# Patient Record
Sex: Female | Born: 1945 | Race: White | Hispanic: No | Marital: Married | Smoking: Former smoker
Health system: Southern US, Community
[De-identification: ages and names within clinical notes are randomized; demographics above are authoritative.]

## PROBLEM LIST (undated history)

## (undated) DIAGNOSIS — K219 Gastro-esophageal reflux disease without esophagitis: Secondary | ICD-10-CM

## (undated) DIAGNOSIS — F32A Depression, unspecified: Secondary | ICD-10-CM

## (undated) DIAGNOSIS — I2510 Atherosclerotic heart disease of native coronary artery without angina pectoris: Secondary | ICD-10-CM

## (undated) DIAGNOSIS — M545 Low back pain, unspecified: Secondary | ICD-10-CM

## (undated) DIAGNOSIS — F329 Major depressive disorder, single episode, unspecified: Secondary | ICD-10-CM

## (undated) DIAGNOSIS — R200 Anesthesia of skin: Secondary | ICD-10-CM

## (undated) DIAGNOSIS — E785 Hyperlipidemia, unspecified: Secondary | ICD-10-CM

## (undated) DIAGNOSIS — Z5189 Encounter for other specified aftercare: Secondary | ICD-10-CM

## (undated) DIAGNOSIS — G8929 Other chronic pain: Secondary | ICD-10-CM

## (undated) DIAGNOSIS — G5603 Carpal tunnel syndrome, bilateral upper limbs: Secondary | ICD-10-CM

## (undated) DIAGNOSIS — M1990 Unspecified osteoarthritis, unspecified site: Secondary | ICD-10-CM

## (undated) DIAGNOSIS — I219 Acute myocardial infarction, unspecified: Secondary | ICD-10-CM

## (undated) HISTORY — DX: Hyperlipidemia, unspecified: E78.5

## (undated) HISTORY — DX: Encounter for other specified aftercare: Z51.89

## (undated) HISTORY — DX: Major depressive disorder, single episode, unspecified: F32.9

## (undated) HISTORY — PX: CORONARY ANGIOPLASTY WITH STENT PLACEMENT: SHX49

## (undated) HISTORY — DX: Depression, unspecified: F32.A

## (undated) HISTORY — PX: CARPAL TUNNEL RELEASE: SHX101

## (undated) HISTORY — DX: Atherosclerotic heart disease of native coronary artery without angina pectoris: I25.10

## (undated) HISTORY — PX: ANKLE SURGERY: SHX546

## (undated) HISTORY — PX: POSTERIOR FUSION LUMBAR SPINE: SUR632

---

## 1999-11-11 ENCOUNTER — Other Ambulatory Visit: Admission: RE | Admit: 1999-11-11 | Discharge: 1999-11-11 | Payer: Self-pay | Admitting: Obstetrics and Gynecology

## 2000-09-07 ENCOUNTER — Ambulatory Visit (HOSPITAL_BASED_OUTPATIENT_CLINIC_OR_DEPARTMENT_OTHER): Admission: RE | Admit: 2000-09-07 | Discharge: 2000-09-07 | Payer: Self-pay | Admitting: Orthopedic Surgery

## 2000-12-30 ENCOUNTER — Other Ambulatory Visit: Admission: RE | Admit: 2000-12-30 | Discharge: 2000-12-30 | Payer: Self-pay | Admitting: Obstetrics and Gynecology

## 2001-11-26 ENCOUNTER — Other Ambulatory Visit: Admission: RE | Admit: 2001-11-26 | Discharge: 2001-11-26 | Payer: Self-pay | Admitting: Gynecology

## 2002-03-01 ENCOUNTER — Encounter: Payer: Self-pay | Admitting: Gastroenterology

## 2002-03-09 ENCOUNTER — Ambulatory Visit (HOSPITAL_COMMUNITY): Admission: RE | Admit: 2002-03-09 | Discharge: 2002-03-09 | Payer: Self-pay | Admitting: Gastroenterology

## 2002-03-09 ENCOUNTER — Encounter: Payer: Self-pay | Admitting: Gastroenterology

## 2002-12-08 ENCOUNTER — Other Ambulatory Visit: Admission: RE | Admit: 2002-12-08 | Discharge: 2002-12-08 | Payer: Self-pay | Admitting: Gynecology

## 2004-02-22 ENCOUNTER — Other Ambulatory Visit: Admission: RE | Admit: 2004-02-22 | Discharge: 2004-02-22 | Payer: Self-pay | Admitting: Gynecology

## 2004-09-05 ENCOUNTER — Ambulatory Visit: Payer: Self-pay | Admitting: Internal Medicine

## 2004-09-17 ENCOUNTER — Ambulatory Visit: Payer: Self-pay | Admitting: Internal Medicine

## 2005-02-12 ENCOUNTER — Ambulatory Visit: Payer: Self-pay | Admitting: Internal Medicine

## 2005-03-13 ENCOUNTER — Other Ambulatory Visit: Admission: RE | Admit: 2005-03-13 | Discharge: 2005-03-13 | Payer: Self-pay | Admitting: Gynecology

## 2005-04-29 ENCOUNTER — Ambulatory Visit: Payer: Self-pay | Admitting: Internal Medicine

## 2005-05-03 ENCOUNTER — Encounter: Admission: RE | Admit: 2005-05-03 | Discharge: 2005-05-03 | Payer: Self-pay | Admitting: Internal Medicine

## 2005-05-07 ENCOUNTER — Ambulatory Visit: Payer: Self-pay | Admitting: Internal Medicine

## 2005-08-01 ENCOUNTER — Ambulatory Visit: Payer: Self-pay | Admitting: Family Medicine

## 2005-09-10 ENCOUNTER — Encounter: Admission: RE | Admit: 2005-09-10 | Discharge: 2005-09-10 | Payer: Self-pay | Admitting: Neurosurgery

## 2006-02-13 ENCOUNTER — Encounter: Admission: RE | Admit: 2006-02-13 | Discharge: 2006-02-13 | Payer: Self-pay | Admitting: Neurosurgery

## 2006-04-23 ENCOUNTER — Other Ambulatory Visit: Admission: RE | Admit: 2006-04-23 | Discharge: 2006-04-23 | Payer: Self-pay | Admitting: Gynecology

## 2006-07-18 LAB — HM MAMMOGRAPHY

## 2006-07-18 LAB — CONVERTED CEMR LAB: Pap Smear: NORMAL

## 2006-08-01 ENCOUNTER — Ambulatory Visit: Payer: Self-pay | Admitting: Internal Medicine

## 2006-08-01 ENCOUNTER — Inpatient Hospital Stay (HOSPITAL_COMMUNITY): Admission: EM | Admit: 2006-08-01 | Discharge: 2006-08-05 | Payer: Self-pay | Admitting: Emergency Medicine

## 2006-08-03 ENCOUNTER — Ambulatory Visit: Payer: Self-pay | Admitting: Internal Medicine

## 2006-08-04 ENCOUNTER — Encounter: Payer: Self-pay | Admitting: Cardiology

## 2006-08-25 ENCOUNTER — Ambulatory Visit: Payer: Self-pay | Admitting: Internal Medicine

## 2006-08-26 ENCOUNTER — Ambulatory Visit: Payer: Self-pay | Admitting: Cardiology

## 2006-09-22 ENCOUNTER — Ambulatory Visit: Payer: Self-pay | Admitting: Internal Medicine

## 2006-09-22 LAB — CONVERTED CEMR LAB
ALT: 20 units/L (ref 0–40)
AST: 26 units/L (ref 0–37)
Albumin: 3.7 g/dL (ref 3.5–5.2)
Alkaline Phosphatase: 49 units/L (ref 39–117)
BUN: 8 mg/dL (ref 6–23)
Bilirubin, Direct: 0.2 mg/dL (ref 0.0–0.3)
CO2: 26 meq/L (ref 19–32)
Calcium: 9.2 mg/dL (ref 8.4–10.5)
Chloride: 109 meq/L (ref 96–112)
Cholesterol: 149 mg/dL (ref 0–200)
Creatinine, Ser: 0.9 mg/dL (ref 0.4–1.2)
GFR calc Af Amer: 82 mL/min
GFR calc non Af Amer: 68 mL/min
Glucose, Bld: 91 mg/dL (ref 70–99)
HDL: 46.2 mg/dL (ref >39.0)
LDL Cholesterol: 69 mg/dL (ref 0–99)
Potassium: 4.4 meq/L (ref 3.5–5.1)
Sodium: 143 meq/L (ref 135–145)
Total Bilirubin: 0.7 mg/dL (ref 0.3–1.2)
Total CHOL/HDL Ratio: 3.2
Total Protein: 5.9 g/dL — ABNORMAL LOW (ref 6.0–8.3)
Triglycerides: 170 mg/dL — ABNORMAL HIGH (ref 0–149)
VLDL: 34 mg/dL (ref 0–40)

## 2006-09-29 ENCOUNTER — Ambulatory Visit: Payer: Self-pay | Admitting: Internal Medicine

## 2006-10-16 ENCOUNTER — Ambulatory Visit: Payer: Self-pay | Admitting: Internal Medicine

## 2006-11-10 ENCOUNTER — Ambulatory Visit: Payer: Self-pay | Admitting: Internal Medicine

## 2006-11-10 LAB — CONVERTED CEMR LAB
ALT: 32 units/L (ref 0–40)
AST: 29 units/L (ref 0–37)
Albumin: 4 g/dL (ref 3.5–5.2)
Alkaline Phosphatase: 68 units/L (ref 39–117)
Bilirubin, Direct: 0.1 mg/dL (ref 0.0–0.3)
Cholesterol: 181 mg/dL (ref 0–200)
Direct LDL: 92.2 mg/dL
HDL: 45.4 mg/dL (ref >39.0)
Total Bilirubin: 0.8 mg/dL (ref 0.3–1.2)
Total CHOL/HDL Ratio: 4
Total Protein: 6.4 g/dL (ref 6.0–8.3)
Triglycerides: 302 mg/dL (ref 0–149)
VLDL: 60 mg/dL — ABNORMAL HIGH (ref 0–40)

## 2006-11-20 ENCOUNTER — Ambulatory Visit: Payer: Self-pay | Admitting: Internal Medicine

## 2006-12-08 ENCOUNTER — Ambulatory Visit: Payer: Self-pay

## 2007-02-16 ENCOUNTER — Telehealth: Payer: Self-pay | Admitting: Internal Medicine

## 2007-02-17 ENCOUNTER — Ambulatory Visit: Payer: Self-pay | Admitting: Internal Medicine

## 2007-02-17 LAB — CONVERTED CEMR LAB
ALT: 22 units/L (ref 0–35)
AST: 24 units/L (ref 0–37)
Albumin: 3.9 g/dL (ref 3.5–5.2)
Alkaline Phosphatase: 63 units/L (ref 39–117)
BUN: 12 mg/dL (ref 6–23)
Bilirubin, Direct: 0.1 mg/dL (ref 0.0–0.3)
CO2: 30 meq/L (ref 19–32)
Calcium: 9.7 mg/dL (ref 8.4–10.5)
Chloride: 110 meq/L (ref 96–112)
Cholesterol: 205 mg/dL (ref 0–200)
Creatinine, Ser: 0.8 mg/dL (ref 0.4–1.2)
Direct LDL: 110.8 mg/dL
GFR calc Af Amer: 94 mL/min
GFR calc non Af Amer: 78 mL/min
Glucose, Bld: 94 mg/dL (ref 70–99)
HDL: 54 mg/dL (ref >39.0)
Potassium: 4.4 meq/L (ref 3.5–5.1)
Sodium: 144 meq/L (ref 135–145)
Total Bilirubin: 1.1 mg/dL (ref 0.3–1.2)
Total CHOL/HDL Ratio: 3.8
Total Protein: 6.8 g/dL (ref 6.0–8.3)
Triglycerides: 252 mg/dL (ref 0–149)
VLDL: 50 mg/dL — ABNORMAL HIGH (ref 0–40)

## 2007-02-26 ENCOUNTER — Ambulatory Visit: Payer: Self-pay | Admitting: Internal Medicine

## 2007-02-26 LAB — CONVERTED CEMR LAB
Basophils Absolute: 0 10*3/uL (ref 0.0–0.1)
Basophils Relative: 0.7 % (ref 0.0–1.0)
Eosinophils Absolute: 0.1 10*3/uL (ref 0.0–0.6)
Eosinophils Relative: 2.3 % (ref 0.0–5.0)
HCT: 38.9 % (ref 36.0–46.0)
Hemoglobin: 13.8 g/dL (ref 12.0–15.0)
INR: 0.7 — ABNORMAL LOW (ref 0.9–2.0)
Lymphocytes Relative: 32.9 % (ref 12.0–46.0)
MCHC: 35.5 g/dL (ref 30.0–36.0)
MCV: 95.8 fL (ref 78.0–100.0)
Monocytes Absolute: 0.9 10*3/uL — ABNORMAL HIGH (ref 0.2–0.7)
Monocytes Relative: 13.1 % — ABNORMAL HIGH (ref 3.0–11.0)
Neutro Abs: 3.4 10*3/uL (ref 1.4–7.7)
Neutrophils Relative %: 51 % (ref 43.0–77.0)
Platelets: 228 10*3/uL (ref 150–400)
Prothrombin Time: 9.9 s — ABNORMAL LOW (ref 10.0–14.0)
RBC: 4.06 M/uL (ref 3.87–5.11)
RDW: 12.6 % (ref 11.5–14.6)
WBC: 6.5 10*3/uL (ref 4.5–10.5)
aPTT: 29 s (ref 26.5–36.5)

## 2007-03-02 DIAGNOSIS — E785 Hyperlipidemia, unspecified: Secondary | ICD-10-CM | POA: Insufficient documentation

## 2007-03-02 DIAGNOSIS — I10 Essential (primary) hypertension: Secondary | ICD-10-CM

## 2007-03-02 DIAGNOSIS — F32A Depression, unspecified: Secondary | ICD-10-CM | POA: Insufficient documentation

## 2007-03-02 DIAGNOSIS — I25110 Atherosclerotic heart disease of native coronary artery with unstable angina pectoris: Secondary | ICD-10-CM

## 2007-03-02 DIAGNOSIS — F329 Major depressive disorder, single episode, unspecified: Secondary | ICD-10-CM

## 2007-06-17 ENCOUNTER — Telehealth: Payer: Self-pay | Admitting: Internal Medicine

## 2007-07-05 ENCOUNTER — Ambulatory Visit: Payer: Self-pay | Admitting: Internal Medicine

## 2007-07-05 LAB — CONVERTED CEMR LAB
ALT: 34 units/L (ref 0–35)
AST: 22 units/L (ref 0–37)
Albumin: 3.8 g/dL (ref 3.5–5.2)
Alkaline Phosphatase: 54 units/L (ref 39–117)
BUN: 14 mg/dL (ref 6–23)
Basophils Absolute: 0 10*3/uL (ref 0.0–0.1)
Basophils Relative: 0.2 % (ref 0.0–1.0)
Bilirubin Urine: NEGATIVE
Bilirubin, Direct: 0.1 mg/dL (ref 0.0–0.3)
CO2: 28 meq/L (ref 19–32)
Calcium: 9.3 mg/dL (ref 8.4–10.5)
Chloride: 106 meq/L (ref 96–112)
Cholesterol: 170 mg/dL (ref 0–200)
Creatinine, Ser: 0.8 mg/dL (ref 0.4–1.2)
Eosinophils Absolute: 0.1 10*3/uL (ref 0.0–0.6)
Eosinophils Relative: 1 % (ref 0.0–5.0)
GFR calc Af Amer: 94 mL/min
GFR calc non Af Amer: 78 mL/min
Glucose, Bld: 86 mg/dL (ref 70–99)
Glucose, Urine, Semiquant: NEGATIVE
HCT: 42.9 % (ref 36.0–46.0)
HDL: 51.4 mg/dL (ref >39.0)
Hemoglobin: 14.6 g/dL (ref 12.0–15.0)
Ketones, urine, test strip: NEGATIVE
LDL Cholesterol: 87 mg/dL (ref 0–99)
Lymphocytes Relative: 41.6 % (ref 12.0–46.0)
MCHC: 34 g/dL (ref 30.0–36.0)
MCV: 97.1 fL (ref 78.0–100.0)
Monocytes Absolute: 0.9 10*3/uL — ABNORMAL HIGH (ref 0.2–0.7)
Monocytes Relative: 12.6 % — ABNORMAL HIGH (ref 3.0–11.0)
Neutro Abs: 3 10*3/uL (ref 1.4–7.7)
Neutrophils Relative %: 44.6 % (ref 43.0–77.0)
Nitrite: NEGATIVE
Platelets: 221 10*3/uL (ref 150–400)
Potassium: 4.2 meq/L (ref 3.5–5.1)
Protein, U semiquant: NEGATIVE
RBC: 4.42 M/uL (ref 3.87–5.11)
RDW: 12.2 % (ref 11.5–14.6)
Sodium: 140 meq/L (ref 135–145)
Specific Gravity, Urine: 1.025
TSH: 2.87 microintl units/mL (ref 0.35–5.50)
Total Bilirubin: 0.7 mg/dL (ref 0.3–1.2)
Total CHOL/HDL Ratio: 3.3
Total Protein: 6.3 g/dL (ref 6.0–8.3)
Triglycerides: 160 mg/dL — ABNORMAL HIGH (ref 0–149)
Urobilinogen, UA: 0.2
VLDL: 32 mg/dL (ref 0–40)
WBC: 6.8 10*3/uL (ref 4.5–10.5)
pH: 5.5

## 2007-07-12 ENCOUNTER — Ambulatory Visit: Payer: Self-pay | Admitting: Internal Medicine

## 2007-07-12 DIAGNOSIS — F17200 Nicotine dependence, unspecified, uncomplicated: Secondary | ICD-10-CM

## 2007-08-06 ENCOUNTER — Telehealth: Payer: Self-pay | Admitting: Internal Medicine

## 2007-08-10 ENCOUNTER — Telehealth: Payer: Self-pay | Admitting: Internal Medicine

## 2007-08-18 ENCOUNTER — Telehealth: Payer: Self-pay | Admitting: Internal Medicine

## 2007-09-16 ENCOUNTER — Telehealth: Payer: Self-pay | Admitting: Internal Medicine

## 2007-09-16 ENCOUNTER — Ambulatory Visit: Payer: Self-pay | Admitting: Internal Medicine

## 2007-09-30 ENCOUNTER — Ambulatory Visit: Payer: Self-pay | Admitting: Internal Medicine

## 2007-10-20 ENCOUNTER — Ambulatory Visit: Payer: Self-pay | Admitting: Internal Medicine

## 2007-11-04 ENCOUNTER — Ambulatory Visit: Payer: Self-pay | Admitting: Family Medicine

## 2008-01-03 ENCOUNTER — Telehealth: Payer: Self-pay | Admitting: Internal Medicine

## 2008-01-04 ENCOUNTER — Ambulatory Visit: Payer: Self-pay | Admitting: Internal Medicine

## 2008-01-05 ENCOUNTER — Ambulatory Visit: Payer: Self-pay | Admitting: Cardiology

## 2008-01-11 ENCOUNTER — Ambulatory Visit: Payer: Self-pay | Admitting: Internal Medicine

## 2008-01-11 ENCOUNTER — Telehealth: Payer: Self-pay | Admitting: Internal Medicine

## 2008-01-11 ENCOUNTER — Observation Stay (HOSPITAL_COMMUNITY): Admission: EM | Admit: 2008-01-11 | Discharge: 2008-01-12 | Payer: Self-pay | Admitting: Emergency Medicine

## 2008-01-14 ENCOUNTER — Telehealth: Payer: Self-pay | Admitting: Internal Medicine

## 2008-01-21 ENCOUNTER — Encounter: Payer: Self-pay | Admitting: Internal Medicine

## 2008-01-31 ENCOUNTER — Ambulatory Visit (HOSPITAL_COMMUNITY): Admission: RE | Admit: 2008-01-31 | Discharge: 2008-01-31 | Payer: Self-pay | Admitting: Neurosurgery

## 2008-02-07 ENCOUNTER — Encounter: Payer: Self-pay | Admitting: Internal Medicine

## 2008-02-14 ENCOUNTER — Ambulatory Visit: Payer: Self-pay | Admitting: Internal Medicine

## 2008-02-14 LAB — CONVERTED CEMR LAB
ALT: 17 units/L (ref 0–35)
Alkaline Phosphatase: 74 units/L (ref 39–117)
BUN: 8 mg/dL (ref 6–23)
Bilirubin, Direct: 0.1 mg/dL (ref 0.0–0.3)
Calcium: 9.5 mg/dL (ref 8.4–10.5)
Creatinine, Ser: 0.8 mg/dL (ref 0.4–1.2)
GFR calc Af Amer: 93 mL/min
Glucose, Bld: 96 mg/dL (ref 70–99)
HDL: 43.1 mg/dL (ref >39.0)
Total Bilirubin: 1 mg/dL (ref 0.3–1.2)
Triglycerides: 250 mg/dL (ref 0–149)

## 2008-02-21 ENCOUNTER — Encounter: Payer: Self-pay | Admitting: Internal Medicine

## 2008-02-23 ENCOUNTER — Ambulatory Visit: Payer: Self-pay | Admitting: Internal Medicine

## 2008-03-02 ENCOUNTER — Ambulatory Visit: Payer: Self-pay | Admitting: Internal Medicine

## 2008-03-22 ENCOUNTER — Telehealth: Payer: Self-pay | Admitting: Internal Medicine

## 2008-04-11 ENCOUNTER — Inpatient Hospital Stay (HOSPITAL_COMMUNITY): Admission: RE | Admit: 2008-04-11 | Discharge: 2008-04-15 | Payer: Self-pay | Admitting: Neurosurgery

## 2008-05-11 ENCOUNTER — Encounter: Payer: Self-pay | Admitting: Internal Medicine

## 2008-06-19 ENCOUNTER — Ambulatory Visit: Payer: Self-pay | Admitting: Internal Medicine

## 2008-06-19 LAB — CONVERTED CEMR LAB
ALT: 15 units/L (ref 0–35)
Albumin: 3.7 g/dL (ref 3.5–5.2)
Calcium: 9.1 mg/dL (ref 8.4–10.5)
Chloride: 107 meq/L (ref 96–112)
Creatinine, Ser: 0.7 mg/dL (ref 0.4–1.2)
Direct LDL: 69.2 mg/dL
GFR calc Af Amer: 109 mL/min
Potassium: 4.1 meq/L (ref 3.5–5.1)
Total CHOL/HDL Ratio: 3.5
Total Protein: 6.4 g/dL (ref 6.0–8.3)
VLDL: 43 mg/dL — ABNORMAL HIGH (ref 0–40)

## 2008-06-26 ENCOUNTER — Ambulatory Visit: Payer: Self-pay | Admitting: Internal Medicine

## 2008-06-26 LAB — CONVERTED CEMR LAB
Bilirubin Urine: NEGATIVE
Glucose, Urine, Semiquant: NEGATIVE
Protein, U semiquant: NEGATIVE
pH: 5.5

## 2008-06-30 ENCOUNTER — Telehealth: Payer: Self-pay | Admitting: Internal Medicine

## 2008-07-06 ENCOUNTER — Telehealth: Payer: Self-pay | Admitting: Internal Medicine

## 2008-07-11 ENCOUNTER — Telehealth: Payer: Self-pay | Admitting: Internal Medicine

## 2008-07-20 ENCOUNTER — Telehealth: Payer: Self-pay | Admitting: Internal Medicine

## 2008-07-31 ENCOUNTER — Telehealth: Payer: Self-pay | Admitting: Internal Medicine

## 2008-08-03 ENCOUNTER — Encounter: Payer: Self-pay | Admitting: Internal Medicine

## 2008-09-13 ENCOUNTER — Telehealth: Payer: Self-pay | Admitting: Internal Medicine

## 2008-10-26 ENCOUNTER — Ambulatory Visit: Payer: Self-pay | Admitting: Internal Medicine

## 2008-11-06 ENCOUNTER — Telehealth: Payer: Self-pay | Admitting: Internal Medicine

## 2008-12-06 ENCOUNTER — Telehealth: Payer: Self-pay | Admitting: Internal Medicine

## 2009-01-04 ENCOUNTER — Telehealth: Payer: Self-pay | Admitting: Internal Medicine

## 2009-01-04 ENCOUNTER — Encounter: Payer: Self-pay | Admitting: Internal Medicine

## 2009-01-31 ENCOUNTER — Telehealth: Payer: Self-pay | Admitting: Internal Medicine

## 2009-03-15 ENCOUNTER — Ambulatory Visit: Payer: Self-pay | Admitting: Internal Medicine

## 2009-03-15 ENCOUNTER — Inpatient Hospital Stay (HOSPITAL_COMMUNITY): Admission: EM | Admit: 2009-03-15 | Discharge: 2009-03-17 | Payer: Self-pay | Admitting: Emergency Medicine

## 2009-03-16 ENCOUNTER — Encounter (INDEPENDENT_AMBULATORY_CARE_PROVIDER_SITE_OTHER): Payer: Self-pay | Admitting: Internal Medicine

## 2009-03-29 ENCOUNTER — Ambulatory Visit: Payer: Self-pay | Admitting: Internal Medicine

## 2009-07-30 ENCOUNTER — Ambulatory Visit: Payer: Self-pay | Admitting: Internal Medicine

## 2009-07-30 ENCOUNTER — Telehealth: Payer: Self-pay | Admitting: Internal Medicine

## 2009-07-30 DIAGNOSIS — K219 Gastro-esophageal reflux disease without esophagitis: Secondary | ICD-10-CM

## 2009-08-01 LAB — CONVERTED CEMR LAB
Albumin: 4 g/dL (ref 3.5–5.2)
BUN: 9 mg/dL (ref 6–23)
Basophils Absolute: 0 10*3/uL (ref 0.0–0.1)
Basophils Relative: 0.9 % (ref 0.0–3.0)
CO2: 29 meq/L (ref 19–32)
Calcium: 9.5 mg/dL (ref 8.4–10.5)
Cholesterol: 147 mg/dL (ref 0–200)
Creatinine, Ser: 0.9 mg/dL (ref 0.4–1.2)
Eosinophils Absolute: 0.1 10*3/uL (ref 0.0–0.7)
GFR calc non Af Amer: 67.08 mL/min (ref >60)
Glucose, Bld: 104 mg/dL — ABNORMAL HIGH (ref 70–99)
HDL: 46.8 mg/dL (ref >39.00)
LDL Cholesterol: 65 mg/dL (ref 0–99)
Lymphocytes Relative: 42.5 % (ref 12.0–46.0)
MCHC: 35 g/dL (ref 30.0–36.0)
Monocytes Relative: 10.6 % (ref 3.0–12.0)
Neutrophils Relative %: 43.2 % (ref 43.0–77.0)
RBC: 4.3 M/uL (ref 3.87–5.11)
TSH: 3.14 microintl units/mL (ref 0.35–5.50)
Total Bilirubin: 1 mg/dL (ref 0.3–1.2)
Total Protein: 7 g/dL (ref 6.0–8.3)
Triglycerides: 177 mg/dL — ABNORMAL HIGH (ref 0.0–149.0)
VLDL: 35.4 mg/dL (ref 0.0–40.0)

## 2009-12-28 ENCOUNTER — Ambulatory Visit: Payer: Self-pay | Admitting: Internal Medicine

## 2009-12-28 ENCOUNTER — Telehealth: Payer: Self-pay | Admitting: Internal Medicine

## 2009-12-31 LAB — CONVERTED CEMR LAB
ALT: 19 units/L (ref 0–35)
AST: 22 units/L (ref 0–37)
BUN: 12 mg/dL (ref 6–23)
Basophils Absolute: 0 10*3/uL (ref 0.0–0.1)
CO2: 27 meq/L (ref 19–32)
Chloride: 104 meq/L (ref 96–112)
Creatinine, Ser: 0.7 mg/dL (ref 0.4–1.2)
Eosinophils Absolute: 0.1 10*3/uL (ref 0.0–0.7)
Glucose, Bld: 96 mg/dL (ref 70–99)
Lymphs Abs: 2 10*3/uL (ref 0.7–4.0)
MCV: 93.1 fL (ref 78.0–100.0)
Monocytes Absolute: 0.6 10*3/uL (ref 0.1–1.0)
Monocytes Relative: 9.9 % (ref 3.0–12.0)
Neutro Abs: 3 10*3/uL (ref 1.4–7.7)
Potassium: 4.2 meq/L (ref 3.5–5.1)
RBC: 4.37 M/uL (ref 3.87–5.11)
Sed Rate: 16 mm/hr (ref 0–22)
Total Bilirubin: 0.8 mg/dL (ref 0.3–1.2)
Total Protein: 6.9 g/dL (ref 6.0–8.3)
WBC: 5.8 10*3/uL (ref 4.5–10.5)

## 2010-02-08 ENCOUNTER — Ambulatory Visit: Payer: Self-pay | Admitting: Internal Medicine

## 2010-04-02 ENCOUNTER — Ambulatory Visit: Payer: Self-pay | Admitting: Internal Medicine

## 2010-04-04 LAB — CONVERTED CEMR LAB
ALT: 17 units/L (ref 0–35)
Alkaline Phosphatase: 89 units/L (ref 39–117)
Bilirubin, Direct: 0.1 mg/dL (ref 0.0–0.3)
Calcium: 9.3 mg/dL (ref 8.4–10.5)
Cholesterol: 204 mg/dL — ABNORMAL HIGH (ref 0–200)
Creatinine, Ser: 0.7 mg/dL (ref 0.4–1.2)
GFR calc non Af Amer: 89.46 mL/min (ref >60)
Sodium: 140 meq/L (ref 135–145)
Total CHOL/HDL Ratio: 5
Total Protein: 6.7 g/dL (ref 6.0–8.3)
Triglycerides: 336 mg/dL — ABNORMAL HIGH (ref 0.0–149.0)

## 2010-04-05 ENCOUNTER — Encounter: Payer: Self-pay | Admitting: Gastroenterology

## 2010-06-04 ENCOUNTER — Encounter (INDEPENDENT_AMBULATORY_CARE_PROVIDER_SITE_OTHER): Payer: Self-pay | Admitting: *Deleted

## 2010-06-04 ENCOUNTER — Ambulatory Visit: Payer: Self-pay | Admitting: Gastroenterology

## 2010-06-04 DIAGNOSIS — K5900 Constipation, unspecified: Secondary | ICD-10-CM | POA: Insufficient documentation

## 2010-06-04 DIAGNOSIS — R1310 Dysphagia, unspecified: Secondary | ICD-10-CM

## 2010-06-05 ENCOUNTER — Ambulatory Visit: Payer: Self-pay | Admitting: Internal Medicine

## 2010-06-05 DIAGNOSIS — D485 Neoplasm of uncertain behavior of skin: Secondary | ICD-10-CM

## 2010-06-17 ENCOUNTER — Ambulatory Visit: Payer: Self-pay | Admitting: Gastroenterology

## 2010-06-17 LAB — HM COLONOSCOPY

## 2010-09-19 NOTE — Assessment & Plan Note (Signed)
Summary: fup throat issues//ccm   Vital Signs:  Patient profile:   65 year old female Weight:      164 pounds Temp:     98.4 degrees F oral Pulse rate:   62 / minute Pulse rhythm:   regular Resp:     12 per minute BP sitting:   112 / 78  (right arm) Cuff size:   regular  Vitals Entered By: Duard Brady LPN (April 02, 2010 8:31 AM) CC: f/u on throat - worse Is Patient Diabetic? No   CC:  f/u on throat - worse.  History of Present Illness:  Follow-Up Visit      This is a 65 year old woman who presents for Follow-up visit.  The patient denies chest pain and palpitations.  Since the last visit the patient notes no new problems or concerns except has recurrent ST, cough , gerd sxs. "sometimes it feels like food lays in the back of my throat).  The patient reports taking meds as prescribed.  When questioned about possible medication side effects, the patient notes none.    Preventive Screening-Counseling & Management  Alcohol-Tobacco     Smoking Status: current  Current Problems (verified): 1)  Gerd  (ICD-530.81) 2)  Tobacco Use  (ICD-305.1) 3)  Physical Examination  (ICD-V70.0) 4)  Myocardial Infarction, Hx of  (ICD-412) 5)  Hypertension  (ICD-401.9) 6)  Hyperlipidemia  (ICD-272.4) 7)  Depression  (ICD-311) 8)  Coronary Artery Disease  (ICD-414.00)  Current Medications (verified): 1)  Aspir-81 81 Mg Tbec (Aspirin) .... Take 1 Tablet By Mouth Once A Day 2)  Clobetasol Propionate 0.05 % Soln (Clobetasol Propionate) .... Apply As Directed As Needed 3)  Metoprolol Tartrate 50 Mg Tabs (Metoprolol Tartrate) .... Take 1/2 Tablet By Mouth Twice A Day 4)  Nitroglycerin 0.4 Mg Subl (Nitroglycerin) .... Place 1 Tablet Under Tongue As Directed As Needed 5)  Plavix 75 Mg Tabs (Clopidogrel Bisulfate) .... Take 1 Tablet By Mouth Once A Day 6)  Vytorin 10-80 Mg  Tabs (Ezetimibe-Simvastatin) .... Take 1 Tablet By Mouth At Bedtime 7)  Fish Oil 500 Mg  Caps (Omega-3 Fatty Acids) ....  Once Daily 8)  Tramadol Hcl 50 Mg  Tabs (Tramadol Hcl) .... One By Mouth Bid 9)  Zolpidem Tartrate 10 Mg Tabs (Zolpidem Tartrate) .... Take 1 Tablet By Mouth At Bedtime --Rx Must Last 30 Days 10)  Fluoxetine Hcl 40 Mg Caps (Fluoxetine Hcl) .Marland Kitchen.. 1 By Mouth Daily 11)  Dexilant 60 Mg Cpdr (Dexlansoprazole) .... Take 1 Tablet By Mouth Once A Day  Allergies: 1)  ! Oxycontin (Oxycodone Hcl) 2)  Penicillin G Potassium (Penicillin G Potassium)  Physical Exam  General:  alert and well-developed.   Head:  normocephalic and atraumatic.   Eyes:  pupils equal and pupils round.   Ears:  R ear normal and L ear normal.   Neck:  No deformities, masses, or tenderness noted. Chest Wall:  No deformities, masses, Lungs:  normal respiratory effort and no intercostal retractions.   Heart:  normal rate and regular rhythm.   Abdomen:  soft and non-tender.  overweight Msk:  No deformity or scoliosis noted of thoracic or lumbar spine.   Pulses:  R radial normal and L radial normal.   Neurologic:  cranial nerves II-XII intact and gait normal.     Impression & Recommendations:  Problem # 1:  GERD (ICD-530.81) persistent gerd and intermittent dysphagia in smoker needs GI evaluation/EGD  Her updated medication list for this problem includes:  Dexilant 60 Mg Cpdr (Dexlansoprazole) .Marland Kitchen... Take 1 tablet by mouth once a day  Problem # 2:  PHYSICAL EXAMINATION (ICD-V70.0) may be due for colonoscopy  Problem # 3:  R/O MELANOMA OF SKIN, SITE UNSPECIFIED (ICD-172.9) hyperpigmented mole on back---left scapular area  Problem # 4:  HYPERTENSION (ICD-401.9)  controlled Her updated medication list for this problem includes:    Metoprolol Tartrate 50 Mg Tabs (Metoprolol tartrate) .Marland Kitchen... Take 1/2 tablet by mouth twice a day  BP today: 112/78 Prior BP: 114/60 (02/08/2010)  Labs Reviewed: K+: 4.2 (12/28/2009) Creat: : 0.7 (12/28/2009)   Chol: 147 (07/30/2009)   HDL: 46.80 (07/30/2009)   LDL: 65 (07/30/2009)    TG: 177.0 (07/30/2009)  Orders: Venipuncture (91478) TLB-BMP (Basic Metabolic Panel-BMET) (80048-METABOL)  Problem # 5:  TOBACCO USE (ICD-305.1) she is tapering off cigarrettes.  Encouraged smoking cessation and discussed different methods for smoking cessation.   Complete Medication List: 1)  Aspir-81 81 Mg Tbec (Aspirin) .... Take 1 tablet by mouth once a day 2)  Clobetasol Propionate 0.05 % Soln (Clobetasol propionate) .... Apply as directed as needed 3)  Metoprolol Tartrate 50 Mg Tabs (Metoprolol tartrate) .... Take 1/2 tablet by mouth twice a day 4)  Nitroglycerin 0.4 Mg Subl (Nitroglycerin) .... Place 1 tablet under tongue as directed as needed 5)  Plavix 75 Mg Tabs (Clopidogrel bisulfate) .... Take 1 tablet by mouth once a day 6)  Vytorin 10-80 Mg Tabs (Ezetimibe-simvastatin) .... Take 1 tablet by mouth at bedtime 7)  Fish Oil 500 Mg Caps (Omega-3 fatty acids) .... Once daily 8)  Tramadol Hcl 50 Mg Tabs (Tramadol hcl) .... One by mouth bid 9)  Zolpidem Tartrate 10 Mg Tabs (Zolpidem tartrate) .... Take 1 tablet by mouth at bedtime --rx must last 30 days 10)  Fluoxetine Hcl 40 Mg Caps (Fluoxetine hcl) .Marland Kitchen.. 1 by mouth daily 11)  Dexilant 60 Mg Cpdr (Dexlansoprazole) .... Take 1 tablet by mouth once a day  Other Orders: TLB-Hepatic/Liver Function Pnl (80076-HEPATIC) TLB-Lipid Panel (80061-LIPID) TLB-TSH (Thyroid Stimulating Hormone) (84443-TSH)  Patient Instructions: 1)  schedule mole removal 2)  We will call you about GI appointment---endoscopy and colonoscopy are possibilities Prescriptions: ZOLPIDEM TARTRATE 10 MG TABS (ZOLPIDEM TARTRATE) Take 1 tablet by mouth at bedtime --Rx must last 30 days  #30 x 1   Entered and Authorized by:   Birdie Sons MD   Signed by:   Birdie Sons MD on 04/02/2010   Method used:   Print then Give to Patient   RxID:   [id number]   Appended Document: Orders Update     Clinical Lists Changes  Orders: Added new Service order of  Specimen Handling ([id number]) - Signed

## 2010-09-19 NOTE — Procedures (Signed)
Summary: Colonoscopy   Colonoscopy  Procedure date:  03/09/2002  Findings:      Location:  [facility].   Patient Name: Bianca [name], Bianca [name] MRN: [id number] Procedure Procedures: Colonoscopy CPT: 54098.  Personnel: Endoscopist: Vania Rea. Jarold Motto, MD.  Referred By: Valetta Mole Swords, MD.  Exam Location: Exam performed in Endoscopy Suite.  Patient Consent: Procedure, Alternatives, Risks and Benefits discussed, consent obtained,  Indications  Average Risk Screening Routine.  History  Pre-Exam Physical: Performed Mar 09, 2002. Cardio-pulmonary exam, Rectal exam, Abdominal exam, Extremity exam, Mental status exam WNL.  Exam Exam: Extent of exam reached: Cecum, extent intended: Cecum.  The cecum was identified by appendiceal orifice and IC valve. Patient position: on left side. Duration of exam: 15 minutes. Colon retroflexion performed. Images taken. ASA Classification: II. Tolerance: excellent.  Monitoring: Pulse and BP monitoring, Oximetry used. Supplemental O2 given. at 2 Liters.  Colon Prep Used Golytely for colon prep. Prep results: excellent.  Fluoroscopy: Fluoroscopy was not used.  Sedation Meds: Fentanyl 50 mcg. given IV. Versed 4 mg. given IV.  Instrument(s): Adjustable colonoscope.  Findings - NOT SEEN ON EXAM: Cecum to Rectum. Polyps, AVM's, Tumors, Crohn's, Diverticulosis,   Assessment Normal examination.  Events  Unplanned Interventions: No intervention was required.  Plans Medication Plan: Continue current medications.  Patient Education: Patient given standard instructions for: Patient instructed to get routine colonoscopy every 5 years.  Disposition: After procedure patient sent to recovery.  Scheduling/Referral: Follow-Up prn.    CC: Bruce H. Swords, MD  This report was created from the original endoscopy report, which was reviewed and signed by the above listed endoscopist.

## 2010-09-19 NOTE — Procedures (Signed)
Summary: Colonoscopy  Patient: [name] Note: All result statuses are Final unless otherwise noted.  Tests: (1) Colonoscopy (COL)   COL Colonoscopy           DONE (C)     [facility]     [address]     [place], [place]  [address]           COLONOSCOPY PROCEDURE REPORT           PATIENT:  Bianca [name], Bianca [name]  MR#:  [id number]     BIRTHDATE:  1946-04-26, 64 yrs. old  GENDER:  female     ENDOSCOPIST:  Vania Rea. Jarold Motto, MD, [facility]     REF. BY:  Birdie Sons, M.D.     PROCEDURE DATE:  06/17/2010     PROCEDURE:  Average-risk screening colonoscopy     G0121     ASA CLASS:  Class II     INDICATIONS:  Routine Risk Screening     MEDICATIONS:   Fentanyl 62.5 mcg IV, Versed 8 mg IV           DESCRIPTION OF PROCEDURE:   After the risks benefits and     alternatives of the procedure were thoroughly explained, informed     consent was obtained.  Digital rectal exam was performed and     revealed no abnormalities.   The LB CF-H180AL [id number] endoscope     was introduced through the anus and advanced to the cecum, which     was identified by both the appendix and ileocecal valve, limited     by a redundant colon, extreme patient discomfort.    The quality     of the prep was adequate, using MoviPrep.  The instrument was then     slowly withdrawn as the colon was fully examined.     <<PROCEDUREIMAGES>>           FINDINGS:  No polyps or cancers were seen.  This was otherwise a     normal examination of the colon.   Retroflexed views in the rectum     revealed no abnormalities.    The scope was then withdrawn from     the patient and the procedure completed.           COMPLICATIONS:  None     ENDOSCOPIC IMPRESSION:     1) No polyps or cancers     2) Otherwise normal examination     RECOMMENDATIONS:     1) Continue current colorectal screening recommendations for     "routine risk" patients with a repeat colonoscopy in 10 years.     REPEAT EXAM:  No        ______________________________     Vania Rea. Jarold Motto, MD, [facility]           CC:           n.     REVISED:  06/20/2010 03:08 PM     eSIGNED:   Vania Rea. Patterson at 06/20/2010 03:08 PM           Leslie Andrea, [id number]  Note: An exclamation mark (!) indicates a result that was not dispersed into the flowsheet. Document Creation Date: 06/20/2010 3:08 PM _______________________________________________________________________  (1) Order result status: Final Collection or observation date-time: 06/17/2010 15:52 Requested date-time:  Receipt date-time:  Reported date-time:  Referring Physician:   Ordering Physician: Sheryn Bison [phone]) Specimen Source:  Source: Launa Grill Order Number: [id number] Lab site:

## 2010-09-19 NOTE — Procedures (Signed)
Summary: Colonoscopy  Patient: [name] Note: All result statuses are Final unless otherwise noted.  Tests: (1) Colonoscopy (COL)   COL Colonoscopy           DONE     [facility]     [address]     [place], [place]  [address]           COLONOSCOPY PROCEDURE REPORT           PATIENT:  Bianca, [name]  MR#:  [id number]     BIRTHDATE:  1946-06-08, 64 yrs. old  GENDER:  female     ENDOSCOPIST:  Vania Rea. Jarold Motto, MD, [facility]     REF. BY:  Birdie Sons, M.D.     PROCEDURE DATE:  06/17/2010     PROCEDURE:  Average-risk screening colonoscopy     G0121     ASA CLASS:  Class II     INDICATIONS:  Routine Risk Screening     MEDICATIONS:   Fentanyl 75 mcg IV, Versed 8 mg IV           DESCRIPTION OF PROCEDURE:   After the risks benefits and     alternatives of the procedure were thoroughly explained, informed     consent was obtained.  Digital rectal exam was performed and     revealed no abnormalities.   The LB CF-H180AL [id number] endoscope     was introduced through the anus and advanced to the cecum, which     was identified by both the appendix and ileocecal valve, limited     by a redundant colon, extreme patient discomfort.    The quality     of the prep was adequate, using MoviPrep.  The instrument was then     slowly withdrawn as the colon was fully examined.     <<PROCEDUREIMAGES>>           FINDINGS:  No polyps or cancers were seen.  This was otherwise a     normal examination of the colon.   Retroflexed views in the rectum     revealed no abnormalities.    The scope was then withdrawn from     the patient and the procedure completed.           COMPLICATIONS:  None     ENDOSCOPIC IMPRESSION:     1) No polyps or cancers     2) Otherwise normal examination     RECOMMENDATIONS:     1) Continue current colorectal screening recommendations for     "routine risk" patients with a repeat colonoscopy in 10 years.     REPEAT EXAM:  No        ______________________________     Vania Rea. Jarold Motto, MD, [facility]           CC:           n.     eSIGNED:   Vania Rea. Patterson at 06/17/2010 03:57 PM           Leslie Andrea, [id number]  Note: An exclamation mark (!) indicates a result that was not dispersed into the flowsheet. Document Creation Date: 06/17/2010 3:58 PM _______________________________________________________________________  (1) Order result status: Final Collection or observation date-time: 06/17/2010 15:52 Requested date-time:  Receipt date-time:  Reported date-time:  Referring Physician:   Ordering Physician: Sheryn Bison [phone]) Specimen Source:  Source: Launa Grill Order Number: [id number] Lab site:   Appended Document: Colonoscopy    Clinical Lists Changes  Observations: Added  new observation of COLONNXTDUE: 05/2020 (06/17/2010 16:05)

## 2010-09-19 NOTE — Assessment & Plan Note (Signed)
Summary: 1 MONTH FOLLOW UP/CJR   Vital Signs:  Patient profile:   65 year old female Weight:      165 pounds BMI:     29.33 Temp:     98.4 degrees F oral Pulse rate:   58 / minute Pulse rhythm:   regular Resp:     12 per minute BP sitting:   114 / 60  (left arm) Cuff size:   regular  Vitals Entered By: Gladis Riffle, RN (February 08, 2010 11:29 AM)  Nutrition Counseling: Patient's BMI is greater than 25 and therefore counseled on weight management options. CC: 1 month rov--discuss dexilant Is Patient Diabetic? No   CC:  1 month rov--discuss dexilant.  History of Present Illness:  Follow-Up Visit      This is a 65 year old woman who presents for Follow-up visit.  The patient denies chest pain and palpitations.  Since the last visit the patient notes no new problems or concerns.  The patient reports taking meds as prescribed.  When questioned about possible medication side effects, the patient notes none.   depression much better GERD---resolved  All other systems reviewed and were negative   Preventive Screening-Counseling & Management  Alcohol-Tobacco     Smoking Status: current     Smoking Cessation Counseling: yes     Packs/Day: 0.25     Year Quit: 1997  Current Problems (verified): 1)  Fatigue  (ICD-780.79) 2)  Gerd  (ICD-530.81) 3)  Tobacco Use  (ICD-305.1) 4)  Physical Examination  (ICD-V70.0) 5)  Myocardial Infarction, Hx of  (ICD-412) 6)  Hypertension  (ICD-401.9) 7)  Hyperlipidemia  (ICD-272.4) 8)  Depression  (ICD-311) 9)  Coronary Artery Disease  (ICD-414.00)  Current Medications (verified): 1)  Aspir-81 81 Mg Tbec (Aspirin) .... Take 1 Tablet By Mouth Once A Day 2)  Clobetasol Propionate 0.05 % Soln (Clobetasol Propionate) .... Apply As Directed As Needed 3)  Metoprolol Tartrate 50 Mg Tabs (Metoprolol Tartrate) .... Take 1/2 Tablet By Mouth Twice A Day 4)  Nitroglycerin 0.4 Mg Subl (Nitroglycerin) .... Place 1 Tablet Under Tongue As Directed As Needed 5)   Plavix 75 Mg Tabs (Clopidogrel Bisulfate) .... Take 1 Tablet By Mouth Once A Day 6)  Vytorin 10-80 Mg  Tabs (Ezetimibe-Simvastatin) .... Take 1 Tablet By Mouth At Bedtime 7)  Fish Oil 500 Mg  Caps (Omega-3 Fatty Acids) .... Once Daily 8)  Tramadol Hcl 50 Mg  Tabs (Tramadol Hcl) .... One By Mouth Bid 9)  Zolpidem Tartrate 10 Mg Tabs (Zolpidem Tartrate) .... Take 1 Tablet By Mouth At Bedtime --Rx Must Last 30 Days 10)  Fluoxetine Hcl 40 Mg Caps (Fluoxetine Hcl) .Marland Kitchen.. 1 By Mouth Daily 11)  Dexilant 60 Mg Cpdr (Dexlansoprazole) .... Take 1 Tablet By Mouth Once A Day  Allergies: 1)  ! Oxycontin (Oxycodone Hcl) 2)  Penicillin G Potassium (Penicillin G Potassium)  Past History:  Past Medical History: Last updated: 07/12/2007 Coronary artery disease  NST MI  2007 Depression Hyperlipidemia Hypertension Myocardial infarction, hx of  1997 Chronic Back Pain---Boterro  Past Surgical History: Last updated: 06/26/2008 angioplasty low back surgery  age 6 Caesarean section ankle surgery  02/03 stent X3 2007 spianl fusion 04/11/08  Family History: Last updated: 07/12/2007 father-dementia 24 yo Mother - DM, htn, CAD (doing airly well at 38) Brother suicide Brother AIDS  Social History: Last updated: 06/26/2008 Married Alcohol use-no Regular exercise-no Current Smoker 1 cigarrette daily  Risk Factors: Exercise: no (07/12/2007)  Risk Factors: Smoking Status:  current (02/08/2010) Packs/Day: 0.25 (02/08/2010)  Social History: Packs/Day:  0.25  Physical Exam  General:  alert and well-developed.   Head:  normocephalic and atraumatic.   Eyes:  pupils equal and pupils round.   Ears:  R ear normal and L ear normal.   Neck:  No deformities, masses, or tenderness noted. Chest Wall:  No deformities, masses, chest wall tenderness to palpation of sternum Lungs:  normal respiratory effort and no intercostal retractions.   Heart:  normal rate and regular rhythm.   Abdomen:  soft and  non-tender.   Msk:  No deformity or scoliosis noted of thoracic or lumbar spine.   Neurologic:  cranial nerves II-XII intact and gait normal.     Impression & Recommendations:  Problem # 1:  FATIGUE (ICD-780.79) resolved feeling much better  Problem # 2:  TOBACCO USE (ICD-305.1)  Encouraged smoking cessation and discussed different methods for smoking cessation.   Problem # 3:  HYPERTENSION (ICD-401.9) controlled continue current medications  Her updated medication list for this problem includes:    Metoprolol Tartrate 50 Mg Tabs (Metoprolol tartrate) .Marland Kitchen... Take 1/2 tablet by mouth twice a day  BP today: 114/60 Prior BP: 128/72 (12/28/2009)  Labs Reviewed: K+: 4.2 (12/28/2009) Creat: : 0.7 (12/28/2009)   Chol: 147 (07/30/2009)   HDL: 46.80 (07/30/2009)   LDL: 65 (07/30/2009)   TG: 177.0 (07/30/2009)  Problem # 4:  CORONARY ARTERY DISEASE (ICD-414.00) no sxs continue current medications  Her updated medication list for this problem includes:    Aspir-81 81 Mg Tbec (Aspirin) .Marland Kitchen... Take 1 tablet by mouth once a day    Metoprolol Tartrate 50 Mg Tabs (Metoprolol tartrate) .Marland Kitchen... Take 1/2 tablet by mouth twice a day    Nitroglycerin 0.4 Mg Subl (Nitroglycerin) .Marland Kitchen... Place 1 tablet under tongue as directed as needed    Plavix 75 Mg Tabs (Clopidogrel bisulfate) .Marland Kitchen... Take 1 tablet by mouth once a day  Labs Reviewed: Chol: 147 (07/30/2009)   HDL: 46.80 (07/30/2009)   LDL: 65 (07/30/2009)   TG: 177.0 (07/30/2009)  Problem # 5:  GERD (ICD-530.81) comletely resolved continue PPI Her updated medication list for this problem includes:    Dexilant 60 Mg Cpdr (Dexlansoprazole) .Marland Kitchen... Take 1 tablet by mouth once a day  Complete Medication List: 1)  Aspir-81 81 Mg Tbec (Aspirin) .... Take 1 tablet by mouth once a day 2)  Clobetasol Propionate 0.05 % Soln (Clobetasol propionate) .... Apply as directed as needed 3)  Metoprolol Tartrate 50 Mg Tabs (Metoprolol tartrate) .... Take 1/2 tablet  by mouth twice a day 4)  Nitroglycerin 0.4 Mg Subl (Nitroglycerin) .... Place 1 tablet under tongue as directed as needed 5)  Plavix 75 Mg Tabs (Clopidogrel bisulfate) .... Take 1 tablet by mouth once a day 6)  Vytorin 10-80 Mg Tabs (Ezetimibe-simvastatin) .... Take 1 tablet by mouth at bedtime 7)  Fish Oil 500 Mg Caps (Omega-3 fatty acids) .... Once daily 8)  Tramadol Hcl 50 Mg Tabs (Tramadol hcl) .... One by mouth bid 9)  Zolpidem Tartrate 10 Mg Tabs (Zolpidem tartrate) .... Take 1 tablet by mouth at bedtime --rx must last 30 days 10)  Fluoxetine Hcl 40 Mg Caps (Fluoxetine hcl) .Marland Kitchen.. 1 by mouth daily 11)  Dexilant 60 Mg Cpdr (Dexlansoprazole) .... Take 1 tablet by mouth once a day  Patient Instructions: 1)  schedule mole removal---ONLY @ pt's convenience Prescriptions: DEXILANT 60 MG CPDR (DEXLANSOPRAZOLE) Take 1 tablet by mouth once a day  #30 x 0  Entered and Authorized by:   Birdie Sons MD   Signed by:   Birdie Sons MD on 02/08/2010   Method used:   Print then Give to Patient   RxID:   [id number] ZOLPIDEM TARTRATE 10 MG TABS (ZOLPIDEM TARTRATE) Take 1 tablet by mouth at bedtime --Rx must last 30 days  #30 x 1   Entered by:   Gladis Riffle, RN   Authorized by:   Birdie Sons MD   Signed by:   Gladis Riffle, RN on 02/08/2010   Method used:   Print then Give to Patient   RxID:   [id number] VYTORIN 10-80 MG  TABS (EZETIMIBE-SIMVASTATIN) Take 1 tablet by mouth at bedtime  #30 x 1   Entered by:   Gladis Riffle, RN   Authorized by:   Birdie Sons MD   Signed by:   Gladis Riffle, RN on 02/08/2010   Method used:   Print then Give to Patient   RxID:   [id number] PLAVIX 75 MG TABS (CLOPIDOGREL BISULFATE) Take 1 tablet by mouth once a day  #30 x 1   Entered by:   Gladis Riffle, RN   Authorized by:   Birdie Sons MD   Signed by:   Gladis Riffle, RN on 02/08/2010   Method used:   Print then Give to Patient   RxID:   [id number]

## 2010-09-19 NOTE — Assessment & Plan Note (Signed)
Summary: GERD/YF    History of Present Illness Visit Type: Initial Consult Primary GI MD: Sheryn Bison MD FACP FAGA Primary Netasha Wehrli: Birdie Sons, MD Requesting Tramond Slinker: Birdie Sons, MD Chief Complaint: GERD, changes in voice, patient feelas if there is something in her throat; hemorrhoidal pressure History of Present Illness:   65 year old Caucasian female with chronic GERD manifested over the last year by worsening hoarseness, coughing, throat clearing, and a globus sensation. She has been on Dexilant continually for the last year with good improvement, and also uses p.r.n. Pepcid AC. She has chronic constipation and bowel irregularity related to spinal surgery with apparently some sacral nerve damage. She denies rectal bleeding, melena, systemic complaints, anorexia or weight loss. Her last colonoscopy was approximately 10 years ago she has not had previous endoscopy or barium studies.  Past medical history is remarkable for coronary artery disease with cardiac stenting and chronic Plavix use. She also has a history of multiple drug allergies. She is followed closely by Dr. Cato Mulligan in primary care.   GI Review of Systems    Reports acid reflux, bloating, dysphagia with solids, nausea, and  vomiting.      Denies abdominal pain, belching, chest pain, dysphagia with liquids, heartburn, loss of appetite, vomiting blood, weight loss, and  weight gain.      Reports black tarry stools, change in bowel habits, constipation, diarrhea, hemorrhoids, and  rectal bleeding.     Denies anal fissure, diverticulosis, fecal incontinence, heme positive stool, irritable bowel syndrome, jaundice, light color stool, liver problems, and  rectal pain. Preventive Screening-Counseling & Management      Drug Use:  no.      Current Medications (verified): 1)  Clobetasol Propionate 0.05 % Soln (Clobetasol Propionate) .... Apply As Directed As Needed 2)  Metoprolol Tartrate 50 Mg Tabs (Metoprolol Tartrate)  .... Take 1/2 Tablet By Mouth Twice A Day 3)  Nitroglycerin 0.4 Mg Subl (Nitroglycerin) .... Place 1 Tablet Under Tongue As Directed As Needed 4)  Plavix 75 Mg Tabs (Clopidogrel Bisulfate) .... Take 1 Tablet By Mouth Once A Day 5)  Vytorin 10-80 Mg  Tabs (Ezetimibe-Simvastatin) .... Take 1 Tablet By Mouth At Bedtime 6)  Tramadol Hcl 50 Mg  Tabs (Tramadol Hcl) .... One By Mouth Bid 7)  Zolpidem Tartrate 10 Mg Tabs (Zolpidem Tartrate) .... Take 1 Tablet By Mouth At Bedtime --Rx Must Last 30 Days 8)  Fluoxetine Hcl 40 Mg Caps (Fluoxetine Hcl) .Marland Kitchen.. 1 By Mouth Daily 9)  Pepcid Ac 10 Mg Tabs (Famotidine) .... Take 1 Tablet By Mouth Daily  Allergies (verified): 1)  ! Oxycontin (Oxycodone Hcl) 2)  Penicillin G Potassium (Penicillin G Potassium)  Past History:  Past medical, surgical, family and social histories (including risk factors) reviewed for relevance to current acute and chronic problems.  Past Medical History: Reviewed history from 07/12/2007 and no changes required. Coronary artery disease  NST MI  2007 Depression Hyperlipidemia Hypertension Myocardial infarction, hx of  1997 Chronic Back Pain---Boterro  Past Surgical History: angioplastyx2 low back surgery  age 69 Caesarean section ankle surgery  02/03 stent X4 2007 spianl fusion 04/11/08  Family History: Reviewed history from 07/12/2007 and no changes required. father-dementia 31 yo Mother - DM, htn, CAD (doing airly well at 8) Brother suicide Brother AIDS Family History of Esophageal Cancer:Paternal GF  Social History: Reviewed history from 06/26/2008 and no changes required. Married Alcohol use-no Regular exercise-no Current Smoker 7 cigarrette daily Daily Caffeine Use 3 Illicit Drug Use - no Drug Use:  no  Review of Systems       The patient complains of back pain, urination changes/pain, urine leakage, and voice change.  The patient denies allergy/sinus, anemia, anxiety-new, arthritis/joint pain, blood in  urine, breast changes/lumps, change in vision, confusion, cough, coughing up blood, depression-new, fainting, fatigue, fever, headaches-new, hearing problems, heart murmur, heart rhythm changes, itching, menstrual pain, muscle pains/cramps, night sweats, nosebleeds, pregnancy symptoms, shortness of breath, skin rash, sleeping problems, sore throat, swelling of feet/legs, swollen lymph glands, thirst - excessive , urination - excessive , and vision changes.   General:  Complains of sleep disorder; denies fever, chills, sweats, anorexia, fatigue, weakness, malaise, and weight loss; she chronically takes tramadol and Ambien.. ENT:  Complains of sore throat, hoarseness, and difficulty swallowing; denies earache, ear discharge, tinnitus, decreased hearing, nasal congestion, loss of smell, and nosebleeds. CV:  Complains of dyspnea on exertion; denies chest pains, angina, palpitations, syncope, orthopnea, PND, peripheral edema, and claudication. GI:  Complains of difficulty swallowing, constipation, and change in bowel habits; denies pain on swallowing, nausea, indigestion/heartburn, vomiting, vomiting blood, abdominal pain, jaundice, gas/bloating, diarrhea, bloody BM's, black BMs, and fecal incontinence; her paternal grandfather apparently had esophageal cancer.. MS:  Complains of low back pain; denies joint pain / LOM, joint swelling, joint stiffness, joint deformity, muscle weakness, muscle cramps, muscle atrophy, leg pain at night, leg pain with exertion, and shoulder pain / LOM hand / wrist pain (CTS). Derm:  Denies rash, itching, dry skin, hives, moles, warts, and unhealing ulcers. Neuro:  Complains of weakness and abnormal sensation; denies paralysis, seizures, syncope, tremors, vertigo, transient blindness, frequent falls, frequent headaches, difficulty walking, headache, sciatica, radiculopathy other:, restless legs, memory loss, and confusion; numbness and weakness related to her spinal surgery apparently  in her left leg.Marland Kitchen Psych:  Denies depression, anxiety, memory loss, suicidal ideation, hallucinations, paranoia, phobia, and confusion. Heme:  Complains of bruising; denies bleeding, enlarged lymph nodes, and pagophagia.  Vital Signs:  Patient profile:   65 year old female Height:      62 inches Weight:      167 pounds BMI:     30.66 Pulse rate:   72 / minute Pulse rhythm:   regular BP sitting:   120 / 66  (left arm) Cuff size:   regular  Vitals Entered By: June McMurray CMA Duncan Dull) (June 04, 2010 1:59 PM)  Physical Exam  General:  Well developed, well nourished, no acute distress.healthy appearing.   Head:  Normocephalic and atraumatic. Eyes:  PERRLA, no icterus.exam deferred to patient's ophthalmologist.   Mouth:  No deformity or lesions, dentition normal. Neck:  Supple; no masses or thyromegaly. Chest Wall:  Symmetrical;  no deformities or tenderness. Lungs:  Clear throughout to auscultation. Heart:  Regular rate and rhythm; no murmurs, rubs,  or bruits. Abdomen:  Soft, nontender and nondistended. No masses, hepatosplenomegaly or hernias noted. Normal bowel sounds. Rectal:  Normal exam.Very sticky pasty-type stool in the rectal vault which is guaiac-negative. No rectal masses or tenderness. Rectal squeeze pressure appears normal. Msk:  Symmetrical with no gross deformities. Normal posture. Neurologic:  Alert and  oriented x4;  grossly normal neurologically. Cervical Nodes:  No significant cervical adenopathy. Psych:  Alert and cooperative. Normal mood and affect.   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 Have scheduled endoscopy and possible dilatation. Also reflect regime reviewed and have reinstituted daily PPI therapy.She may need twice a day PPI therapy, and  perhaps manometry and 24-hour pH probe testing depending on her clinical  response. Orders: Colon/Endo (Colon/Endo)  Problem # 2:  CONSTIPATION (ICD-564.00) Assessment: Deteriorated Trial of [employer] fiber and probiotic therapy twice a day. Followup colonoscopy also scheduled her convenience. Orders: Colon/Endo (Colon/Endo)  Problem # 3:  GERD (ICD-530.81) Assessment: Deteriorated Stop Dexilant her cramping and diarrhea and we'll try Nexium 40 mg q.a.m. and twice a day if needed. Colon/Endo (Colon/Endo)  Problem # 4:  CORONARY ARTERY DISEASE (ICD-414.00) Assessment: Improved continue cardiac medications per Dr. Cato Mulligan and Dr. Huston Foley in cardiology. We will hold her Plavix 5 days before her endoscopic procedures unless otherwise instructed.  Patient Instructions: 1)  Copy sent to : Birdie Sons, MD & Dietrich Pates, MD 2)  [facility] Patient Information Guide given to patient.  3)  Colonoscopy and Flexible Sigmoidoscopy brochure given.  4)  Upper Endoscopy brochure given.  5)  Your prescription(s) have been sent to you pharmacy.  6)  Your procedure has been scheduled for 06/17/2010, please follow the seperate instructions.  7)  We will contact you about holding your plavix for your procedures. 8)  Buy [employer] OTC and take one by mouth once daily. 9)  The medication list was reviewed and reconciled.  All changed / newly prescribed medications were explained.  A complete medication list was provided to the patient / caregiver. Prescriptions: MOVIPREP 100 GM  SOLR (PEG-KCL-NACL-NASULF-NA ASC-C) As per prep instructions.  #1 x 0   Entered by:   Harlow Mares CMA (AAMA)   Authorized by:   Mardella Layman MD [facility]   Signed by:   Harlow Mares CMA (AAMA) on 06/04/2010   Method used:   Electronically to        [id number] (retail)       [address]       [place], [place]  [address]       Ph: [phone] or [phone]       Fax: [phone]   RxID:   [id number] NEXIUM 40 MG CPDR (ESOMEPRAZOLE MAGNESIUM)  take one by mouth once daily  #30 x 6   Entered by:   Harlow Mares CMA (AAMA)   Authorized by:   Mardella Layman MD [facility]   Signed by:   Harlow Mares CMA (AAMA) on 06/04/2010   Method used:   Electronically to        [id number] (retail)       [address]       [place], [place]  [address]       Ph: [phone] or [phone]       Fax: [phone]   RxID:   [id number]

## 2010-09-19 NOTE — Assessment & Plan Note (Signed)
Summary: New Patient  Bianca [name]  MR#:  [id number] Page #  [facility]   GASTROENTEROLOGY CONSULTATION  NAME:  Bianca [name], Bianca [name]   OFFICE NO:  [id number]  DATE:  03/01/02    CHIEF COMPLAINT:  The patient is a 65 year old white female referred through the courtesy of Dr. Cato Mulligan for screening colonoscopy.  The patient has been bothered by hyperlipidemia with an MI in 1997, which required angioplasty and stent placement by Dr. Garnette Scheuermann.  She is currently asymptomatic in terms of cardiovascular problems, and is followed currently by Dr. Birdie Sons and Dr. Dietrich Pates.  She denies any GI complaints and has regular bowel movements without melena, hematochezia, crampy lower abdominal pain, dyspepsia, reflux symptoms or any history of hepatobiliary, pancreatic or gallbladder problems.  Her appetite is good, her weight is stable and she follows a regular diet.  She has never had screening colonoscopy or endoscopy exams.  She denies abuse of alcohol, cigarettes or NSAIDs.    CURRENT MEDICATIONS:  Metoprolol 25 mg twice a day, Lipitor 20 mg a day, Ecotrin 325 mg a day, Prozac 20 mg a day and p.r.n. nitroglycerin.    ALLERGIES:  She is allergic to penicillin and Ceclor.    FAMILY HISTORY:  Noncontributory.  REVIEW OF SYSTEMS:  The patient denies any current cardiovascular, pulmonary, genitourinary, neurologic, orthopedic, endocrine, neuropsychiatric or other known symptoms at this time.  On reviewing her records, she has had normal liver function tests and CBCs.  PAST MEDICAL HISTORY:  The patient has had childbirth x 2 and had a lumbar laminectomy at the age of 70.  She has no history of cardiac arrhythmias.  She does have a history of migraine headaches.    PHYSICAL EXAMINATION:  The patient's weight today is 163 pounds and blood pressure is 98/58.  Pulse was 62 and regular.  I could not appreciate stigmata of chronic liver disease.  Her chest was clear and there were no murmurs, gallops or rubs noted.   Abdominal exam showed no organomegaly, masses or tenderness.  Peripheral extremities were unremarkable.  Rectal exam was deferred at this time. The patient relates recent hemoccult cards have been guaiac negative.  ASSESSMENT:  The patient has no real risk factors for colonic polyposis but because of her age does need screening colonoscopy.  As mentioned above, she is asymptomatic in terms of any gastrointestinal complaints at this time.     RECOMMENDATIONS:  We have scheduled the patient for colonoscopy at her convenience off of salicylate therapy.  The risks and benefits of this procedure have been explained in detail and she has agreed to proceed as planned.  She is to continue her cardiac medicines and follow up with Dr. Cato Mulligan as previously scheduled.     Vania Rea. Jarold Motto, M.D., F.A.C.P., F.A.C.G.  [id number] cc:  Dr. Birdie Sons; Dr. Dietrich Pates D:  03/01/02; T:  ; Job [phone]

## 2010-09-19 NOTE — Miscellaneous (Signed)
Summary: PPIS  Patient has tried and failed Aciphex, Dexilant.

## 2010-09-19 NOTE — Progress Notes (Signed)
Summary: med change required  Phone Note Outgoing Call Call back at [phone]   Call placed by: Cristie Hem RN Call placed to: Insurer Summary of Call: Prior authorization required for aciphex 20mg  Take 1 tablet by mouth once a day  She does pass the PA for this but must also pass step therapy.  For this needs to take one month each for 2 of the 3 meds listed:       dexilant, nexium, lansoprazole    Pls advise which to try and dose etc.   pt agreeable.  Wants phone call when ready.  [facility] Initial call taken by: Gladis Riffle, RN,  Dec 28, 2009 2:31 PM  Follow-up for Phone Call        dexilant 60 mg by mouth once daily is fine Follow-up by: Birdie Sons MD,  Dec 28, 2009 2:38 PM  Additional Follow-up for Phone Call Additional follow up Details #1::        see Rx.Patient notified.  Additional Follow-up by: Gladis Riffle, RN,  Dec 28, 2009 2:53 PM    New/Updated Medications: DEXILANT 60 MG CPDR (DEXLANSOPRAZOLE) Take 1 tablet by mouth once a day Prescriptions: DEXILANT 60 MG CPDR (DEXLANSOPRAZOLE) Take 1 tablet by mouth once a day  #30 x 1   Entered by:   Gladis Riffle, RN   Authorized by:   Birdie Sons MD   Signed by:   Gladis Riffle, RN on 12/28/2009   Method used:   Electronically to        [id number] (retail)       [address]       [place], [place]  [address]       Ph: [phone] or [phone]       Fax: [phone]   RxID:   [id number]

## 2010-09-19 NOTE — Assessment & Plan Note (Signed)
Summary: F/U FOR LUMP ON THROAT AREA // RS   Vital Signs:  Patient profile:   65 year old female Weight:      169 pounds BMI:     30.05 Temp:     97.8 degrees F oral Pulse rate:   58 / minute Pulse rhythm:   regular Resp:     12 per minute BP sitting:   128 / 72  (left arm) Cuff size:   regular  Vitals Entered By: Gladis Riffle, RN (Dec 28, 2009 8:19 AM) CC: FU limp in throat--states worse than before--has not taken flonase in 6 months but wants to get bak on it Is Patient Diabetic? No Comments smoking has increase some due to "things going on" c/o intermittent burning on urinatio   CC:  FU limp in throat--states worse than before--has not taken flonase in 6 months but wants to get bak on it.  History of Present Illness: C/O throat burning---sxs ongoing for months progressively worse the past 1 week. taking prilosec. feels like previouw GERD sxs  dysuria, urinary urgency---one week  she admits to ongoing and worsening fatigue. NO CP, SOB, pND  has hx of CAD, no CP, SOB, PND  Admits to hx of depression---self dc fluoxetine 6 months ago  All other systems reviewed and were negative   Preventive Screening-Counseling & Management  Alcohol-Tobacco     Smoking Status: current     Smoking Cessation Counseling: yes     Packs/Day: 0.5     Year Quit: 1997  Current Problems (verified): 1)  Gerd  (ICD-530.81) 2)  Tobacco Use  (ICD-305.1) 3)  Physical Examination  (ICD-V70.0) 4)  Myocardial Infarction, Hx of  (ICD-412) 5)  Hypertension  (ICD-401.9) 6)  Hyperlipidemia  (ICD-272.4) 7)  Depression  (ICD-311) 8)  Coronary Artery Disease  (ICD-414.00)  Current Medications (verified): 1)  Aspir-81 81 Mg Tbec (Aspirin) .... Take 1 Tablet By Mouth Once A Day 2)  Clobetasol Propionate 0.05 % Soln (Clobetasol Propionate) .... Apply As Directed As Needed 3)  Metoprolol Tartrate 50 Mg Tabs (Metoprolol Tartrate) .... Take 1/2 Tablet By Mouth Twice A Day 4)  Nitroglycerin 0.4 Mg Subl  (Nitroglycerin) .... Place 1 Tablet Under Tongue As Directed As Needed 5)  Plavix 75 Mg Tabs (Clopidogrel Bisulfate) .... Take 1 Tablet By Mouth Once A Day 6)  Vytorin 10-80 Mg  Tabs (Ezetimibe-Simvastatin) .... Take 1 Tablet By Mouth At Bedtime 7)  Fish Oil 500 Mg  Caps (Omega-3 Fatty Acids) .... Once Daily 8)  Tramadol Hcl 50 Mg  Tabs (Tramadol Hcl) .... One By Mouth Bid 9)  Zolpidem Tartrate 10 Mg Tabs (Zolpidem Tartrate) .... Take 1 Tablet By Mouth At Bedtime --Rx Must Last 30 Days 10)  Omeprazole 20 Mg Cpdr (Omeprazole) .... One By Mouth Daily  Allergies: 1)  ! Oxycontin (Oxycodone Hcl) 2)  Penicillin G Potassium (Penicillin G Potassium)  Past History:  Past Medical History: Last updated: 07/12/2007 Coronary artery disease  NST MI  2007 Depression Hyperlipidemia Hypertension Myocardial infarction, hx of  1997 Chronic Back Pain---Boterro  Past Surgical History: Last updated: 06/26/2008 angioplasty low back surgery  age 6 Caesarean section ankle surgery  02/03 stent X3 2007 spianl fusion 04/11/08  Family History: Last updated: 07/12/2007 father-dementia 4 yo Mother - DM, htn, CAD (doing airly well at 52) Brother suicide Brother AIDS  Social History: Last updated: 06/26/2008 Married Alcohol use-no Regular exercise-no Current Smoker 1 cigarrette daily  Risk Factors: Exercise: no (07/12/2007)  Risk Factors: Smoking  Status: current (12/28/2009) Packs/Day: 0.5 (12/28/2009)  Social History: Packs/Day:  0.5  Physical Exam  General:  alert and well-developed.   Head:  normocephalic and atraumatic.   Eyes:  pupils equal and pupils round.   Ears:  R ear normal and L ear normal.   Neck:  No deformities, masses, or tenderness noted. Chest Wall:  No deformities, masses, chest wall tenderness to palpation of sternum Lungs:  normal respiratory effort and no intercostal retractions.   Heart:  normal rate and regular rhythm.   Abdomen:  soft and non-tender.     Msk:  No deformity or scoliosis noted of thoracic or lumbar spine.   Neurologic:  cranial nerves II-XII intact and gait normal.     Impression & Recommendations:  Problem # 1:  GERD (ICD-530.81)  I suspect this is the cause of her sxs change omeprazole to new ppi side effects discussed  Her updated medication list for this problem includes:    Aciphex 20 Mg Tbec (Rabeprazole sodium) .Marland Kitchen... Take 1 tab every morning  Problem # 2:  TOBACCO USE (ICD-305.1) needs to quit discussed options---she is not interested in quitting  Problem # 3:  HYPERLIPIDEMIA (ICD-272.4) reviewed labs continue current medications  Her updated medication list for this problem includes:    Vytorin 10-80 Mg Tabs (Ezetimibe-simvastatin) .Marland Kitchen... Take 1 tablet by mouth at bedtime  Labs Reviewed: SGOT: 20 (07/30/2009)   SGPT: 16 (07/30/2009)   HDL:46.80 (07/30/2009), 41.8 (06/19/2008)  LDL:65 (07/30/2009), DEL (16/05/9603)  Chol:147 (07/30/2009), 145 (06/19/2008)  Trig:177.0 (07/30/2009), 214 (06/19/2008)  Problem # 4:  FATIGUE (ICD-780.79)  Orders: TLB-BMP (Basic Metabolic Panel-BMET) (80048-METABOL) TLB-Hepatic/Liver Function Pnl (80076-HEPATIC) TLB-CBC Platelet - w/Differential (85025-CBCD) TLB-TSH (Thyroid Stimulating Hormone) (84443-TSH) Venipuncture (54098) TLB-Sedimentation Rate (ESR) (85652-ESR)  Problem # 5:  HYPERTENSION (ICD-401.9) controlled continue current medications  Her updated medication list for this problem includes:    Metoprolol Tartrate 50 Mg Tabs (Metoprolol tartrate) .Marland Kitchen... Take 1/2 tablet by mouth twice a day  BP today: 128/72 Prior BP: 126/64 (07/30/2009)  Labs Reviewed: K+: 4.8 (07/30/2009) Creat: : 0.9 (07/30/2009)   Chol: 147 (07/30/2009)   HDL: 46.80 (07/30/2009)   LDL: 65 (07/30/2009)   TG: 177.0 (07/30/2009)  Problem # 6:  DEPRESSION (ICD-311) noncompliant with meds resume fluoxetine  Complete Medication List: 1)  Aspir-81 81 Mg Tbec (Aspirin) .... Take 1 tablet  by mouth once a day 2)  Clobetasol Propionate 0.05 % Soln (Clobetasol propionate) .... Apply as directed as needed 3)  Metoprolol Tartrate 50 Mg Tabs (Metoprolol tartrate) .... Take 1/2 tablet by mouth twice a day 4)  Nitroglycerin 0.4 Mg Subl (Nitroglycerin) .... Place 1 tablet under tongue as directed as needed 5)  Plavix 75 Mg Tabs (Clopidogrel bisulfate) .... Take 1 tablet by mouth once a day 6)  Vytorin 10-80 Mg Tabs (Ezetimibe-simvastatin) .... Take 1 tablet by mouth at bedtime 7)  Fish Oil 500 Mg Caps (Omega-3 fatty acids) .... Once daily 8)  Tramadol Hcl 50 Mg Tabs (Tramadol hcl) .... One by mouth bid 9)  Zolpidem Tartrate 10 Mg Tabs (Zolpidem tartrate) .... Take 1 tablet by mouth at bedtime --rx must last 30 days 10)  Cipro 250 Mg Tabs (Ciprofloxacin hcl) .Marland Kitchen.. 1 by mouth 2 times daily 11)  Fluoxetine Hcl 40 Mg Caps (Fluoxetine hcl) .Marland Kitchen.. 1 by mouth daily 12)  Aciphex 20 Mg Tbec (Rabeprazole sodium) .... Take 1 tab every morning  Other Orders: UA Dipstick w/o Micro (automated)  (81003)  Patient Instructions: 1)  Please  schedule a follow-up appointment in 1 month. 2)  Tobacco is very bad for your health and your loved ones! You Should stop smoking!. 3)  Stop Smoking Tips: Choose a Quit date. Cut down before the Quit date. decide what you will do as a substitute when you feel the urge to smoke(gum,toothpick,exercise). Prescriptions: ACIPHEX 20 MG TBEC (RABEPRAZOLE SODIUM) Take 1 tab every morning  #30 x 3   Entered and Authorized by:   Birdie Sons MD   Signed by:   Birdie Sons MD on 12/28/2009   Method used:   Electronically to        [facility] (retail)       [address]       [place], [place]  [address]       Ph: [phone] or [phone]       Fax: [phone]   RxID:   [id number] CIPRO 250 MG TABS (CIPROFLOXACIN HCL) 1 by mouth 2 times daily  #6 x 0   Entered and Authorized by:   Birdie Sons MD   Signed by:   Birdie Sons MD on 12/28/2009   Method used:    Electronically to        [facility] (retail)       [address]       [place], [place]  [address]       Ph: [phone] or [phone]       Fax: [phone]   RxID:   [id number] FLUOXETINE HCL 40 MG CAPS (FLUOXETINE HCL) 1 by mouth daily  #90 x 3   Entered and Authorized by:   Birdie Sons MD   Signed by:   Birdie Sons MD on 12/28/2009   Method used:   Electronically to        [facility] (retail)       [address]       [place], [place]  [address]       Ph: [phone] or [phone]       Fax: [phone]   RxID:   [id number]   Prevention & Chronic Care Immunizations   Influenza vaccine: Fluvax 3+  (07/30/2009)   Influenza vaccine due: 07/2008    Tetanus booster: 07/12/2007: Tdap   Tetanus booster due: 07/2017    Pneumococcal vaccine: Not documented    H. zoster vaccine: Not documented  Colorectal Screening   Hemoccult: Not documented    Colonoscopy: Normal  (08/18/2001)   Colonoscopy due: 08/2008  Other Screening   Pap smear: Normal  (07/18/2006)    Mammogram: Normal Bilateral  (07/18/2006)    DXA bone density scan: Not documented   Smoking status: current  (12/28/2009)   Smoking cessation counseling: yes  (12/28/2009)  Lipids   Total Cholesterol: 147  (07/30/2009)   LDL: 65  (07/30/2009)   LDL Direct: 69.2  (06/19/2008)   HDL: 46.80  (07/30/2009)   Triglycerides: 177.0  (07/30/2009)    SGOT (AST): 20  (07/30/2009)   SGPT (ALT): 16  (07/30/2009)   Alkaline phosphatase: 85  (07/30/2009)   Total bilirubin: 1.0  (07/30/2009)  Hypertension   Last Blood Pressure: 128 / 72  (12/28/2009)   Serum creatinine: 0.9  (07/30/2009)   Serum potassium 4.8  (07/30/2009)  Self-Management Support :    Hypertension self-management support: Not documented    Lipid self-management support: Not documented    Appended Document: F/U FOR LUMP ON THROAT AREA // RS  Laboratory Results   Urine Tests  Routine Urinalysis   Color:  yellow Appearance: Clear Glucose: negative   (Normal Range: Negative) Bilirubin: negative   (Normal Range: Negative) Ketone: negative   (Normal Range: Negative) Spec. Gravity: 1.010   (Normal Range: 1.003-1.035) Blood: 1+   (Normal Range: Negative) pH: 5.5   (Normal Range: 5.0-8.0) Protein: negative   (Normal Range: Negative) Urobilinogen: 0.2   (Normal Range: 0-1) Nitrite: negative   (Normal Range: Negative) Leukocyte Esterace: 1+   (Normal Range: Negative)    Comments: Rita Ohara  Dec 28, 2009 10:35 AM

## 2010-09-19 NOTE — Letter (Signed)
Summary: Anticoagulation Modification Letter  [facility]  [address] [place], [place] [address]   Phone: [phone]  Fax: [phone]    June 04, 2010  Re:    Bianca [name] DOB:    29-Jan-1946 MRN:    [id number]    Dear Dr. Tenny Craw:  We have scheduled the above patient for an endoscopic procedure. Our records show that  he/she is on anticoagulation therapy. Please advise as to how long the patient may come off their therapy of Plavix prior to the scheduled procedure(s) on 06/17/2010.   Please route the completed form to [facility], CMA (AAMA)   Thank you for your help with this matter.  Sincerely,  Harlow Mares CMA Duncan Dull)   Physician Recommendation:  Hold Plavix 7 days prior ________________  Hold Coumadin 5 days prior ____________  Other ______________________________     Appended Document: Anticoagulation Modification Letter I saw patient last in 2008   Appears to have had cath in 2010 (no intervention) OK to come off plavix but would resume after procedure since she has several stents in RCA  Appended Document: Anticoagulation Modification Letter PT AWARE. PER PT WILL CALL BACK TO SCHEDULE F/U WITH DR ROSS.CY

## 2010-09-19 NOTE — Medication Information (Signed)
Summary: Denial of Coverage for AcipHex  Denial of Coverage for AcipHex   Imported By: Maryln Gottron 01/02/2010 15:33:14  _____________________________________________________________________  External Attachment:    Type:   Image     Comment:   External Document

## 2010-09-19 NOTE — Procedures (Signed)
Summary: Upper Endoscopy  Patient: Bianca [name] Note: All result statuses are Final unless otherwise noted.  Tests: (1) Upper Endoscopy (EGD)   EGD Upper Endoscopy       DONE (C)     [facility]     [address]     [place], [place]  [address]           ENDOSCOPY PROCEDURE REPORT           PATIENT:  [name], [name]  MR#:  [id number]     BIRTHDATE:  1945/12/11, 64 yrs. old  GENDER:  female           ENDOSCOPIST:  Vania Rea. Jarold Motto, MD, [facility]     Referred by:  Birdie Sons, M.D.           PROCEDURE DATE:  06/17/2010     PROCEDURE:  Elease Hashimoto Dilation of Esophagus, Esophagogastroscopy     ASA CLASS:  Class II     INDICATIONS:  GERD, dysphagia           MEDICATIONS:   There was residual sedation effect present from     prior procedure., Fentanyl 12.5 mcg IV, Versed 1 mg IV     TOPICAL ANESTHETIC:  Exactacain Spray           DESCRIPTION OF PROCEDURE:   After the risks benefits and     alternatives of the procedure were thoroughly explained, informed     consent was obtained.  The LB GIF-H180 [id number] endoscope was     introduced through the mouth and advanced to the second portion of     the duodenum, limited by patient discomfort.   The instrument was     slowly withdrawn as the mucosa was fully examined.     <<PROCEDUREIMAGES>>           ULTRASONIC FINDINGS:   AVM.  antral nonbleeding 2mm AVM's     noted.see picture. A hiatal hernia was found. small hh noted.no     stricture but dilated #56f maloney dilator.  Normal duodenal folds     were noted.  The esophagus and gastroesophageal junction were     completely normal in appearance.  The stomach was entered and     closely examined. The antrum, angularis, and lesser curvature were     well visualized, including a retroflexed view of the cardia and     fundus. The stomach wall was normally distensable. The scope     passed easily through the pylorus into the duodenum.     Retroflexed views revealed no abnormalities.     The scope was then     withdrawn from the patient and the procedure completed.           COMPLICATIONS:  None           ENDOSCOPIC IMPRESSION:     1) Hiatal hernia     2) AVM     3) Normal duodenal folds     4) Normal esophagus     5) Normal stomach     Chronic gerd and probable occult stricture dilated.     RECOMMENDATIONS:     1) Clear liquids until, then soft foods rest iof day. Resume     prior diet tomorrow.     2) resume previous medications     3) dilatations PRN           REPEAT EXAM:  No  ______________________________     Vania Rea. Jarold Motto, MD, [facility]           CC:           n.     REVISED:  06/20/2010 02:57 PM     eSIGNED:   Vania Rea. Patterson at 06/20/2010 02:57 PM           Leslie Andrea, [id number]  Note: An exclamation mark (!) indicates a result that was not dispersed into the flowsheet. Document Creation Date: 06/20/2010 2:58 PM _______________________________________________________________________  (1) Order result status: Final Collection or observation date-time: 06/17/2010 16:02 Requested date-time:  Receipt date-time:  Reported date-time:  Referring Physician:   Ordering Physician: Sheryn Bison [phone]) Specimen Source:  Source: Launa Grill Order Number: [id number] Lab site:

## 2010-09-19 NOTE — Letter (Signed)
Summary: New Patient letter  [facility]  [address] [place], [place] [address]   Phone: [phone]  Fax: [phone]       04/05/2010 MRN: [id number]  KEYOSHA TIEDT [address] [place], [place]  [address]  Dear Ms. Ayyad,  Welcome to the Gastroenterology Division at [facility]    You are scheduled to see Dr.  Jarold Motto on 06-04-10 at 2pm on the 3rd floor at [facility], [address]  We ask that you try to arrive at our office 15 minutes prior to your appointment time to allow for check-in.  We would like you to complete the enclosed self-administered evaluation form prior to your visit and bring it with you on the day of your appointment.  We will review it with you.  Also, please bring a complete list of all your medications or, if you prefer, bring the medication bottles and we will list them.  Please bring your insurance card so that we may make a copy of it.  If your insurance requires a referral to see a specialist, please bring your referral form from your primary care physician.  Co-payments are due at the time of your visit and may be paid by cash, check or credit card.     Your office visit will consist of a consult with your physician (includes a physical exam), any laboratory testing he/she may order, scheduling of any necessary diagnostic testing (e.g. x-ray, ultrasound, CT-scan), and scheduling of a procedure (e.g. Endoscopy, Colonoscopy) if required.  Please allow enough time on your schedule to allow for any/all of these possibilities.    If you cannot keep your appointment, please call [phone] to cancel or reschedule prior to your appointment date.  This allows [place] the opportunity to schedule an appointment for another patient in need of care.  If you do not cancel or reschedule by 5 p.m. the business day prior to your appointment date, you will be charged a $50.00 late cancellation/no-show fee.    Thank you for choosing [facility] for your medical needs.  We appreciate the opportunity to care for you.  Please visit [place] at our website  to learn more about our practice.                     Sincerely,                                                             The Gastroenterology Division

## 2010-09-19 NOTE — Letter (Signed)
Summary: [facility] Instructions  [facility]  [address] [place], [place] [address]   Phone: [phone]  Fax: [phone]       Bianca [name]    [birth year]    MRN: [id number]        Procedure Day /Date: 06/17/2010 Monday     Arrival Time: 2:30pm     Procedure Time: 3:30pm     Location of Procedure:                    X  [facility] (4th Floor)                        PREPARATION FOR COLONOSCOPY WITH MOVIPREP   Starting 5 days prior to your procedure 06/12/2010 do not eat nuts, seeds, popcorn, corn, beans, peas,  salads, or any raw vegetables.  Do not take any fiber supplements (e.g. Metamucil, Citrucel, and Benefiber).  THE DAY BEFORE YOUR PROCEDURE         DATE: 06/16/2010  DAY: Sunday  1.  Drink clear liquids the entire day-NO SOLID FOOD  2.  Do not drink anything colored red or purple.  Avoid juices with pulp.  No orange juice.  3.  Drink at least 64 oz. (8 glasses) of fluid/clear liquids during the day to prevent dehydration and help the prep work efficiently.  CLEAR LIQUIDS INCLUDE: Water Jello Ice Popsicles Tea (sugar ok, no milk/cream) Powdered fruit flavored drinks Coffee (sugar ok, no milk/cream) Gatorade Juice: apple, white grape, white cranberry  Lemonade Clear bullion, consomm, broth Carbonated beverages (any kind) Strained chicken noodle soup Hard Candy                             4.  In the morning, mix first dose of MoviPrep solution:    Empty 1 Pouch A and 1 Pouch B into the disposable container    Add lukewarm drinking water to the top line of the container. Mix to dissolve    Refrigerate (mixed solution should be used within 24 hrs)  5.  Begin drinking the prep at 5:00 p.m. The MoviPrep container is divided by 4 marks.   Every 15 minutes drink the solution down to the next mark (approximately 8 oz) until the full liter is complete.   6.  Follow completed prep with 16 oz of clear liquid of your choice (Nothing  red or purple).  Continue to drink clear liquids until bedtime.  7.  Before going to bed, mix second dose of MoviPrep solution:    Empty 1 Pouch A and 1 Pouch B into the disposable container    Add lukewarm drinking water to the top line of the container. Mix to dissolve    Refrigerate  THE DAY OF YOUR PROCEDURE      DATE: 06/17/2010 DAY: Monday  Beginning at 10:30am (5 hours before procedure):         1. Every 15 minutes, drink the solution down to the next mark (approx 8 oz) until the full liter is complete.  2. Follow completed prep with 16 oz. of clear liquid of your choice.    3. You may drink clear liquids until 1:30pm (2 HOURS BEFORE PROCEDURE).   MEDICATION INSTRUCTIONS  Unless otherwise instructed, you should take regular prescription medications with a small sip of water   as early as possible the morning of your procedure.  You will  be contacted by our office prior to your procedure for directions on holding your Plavix.  If you do not hear from our office 1 week prior to your scheduled procedure, please call [phone] to discuss.          OTHER INSTRUCTIONS  You will need a responsible adult at least 65 years of age to accompany you and drive you home.   This person must remain in the waiting room during your procedure.  Wear loose fitting clothing that is easily removed.  Leave jewelry and other valuables at home.  However, you may wish to bring a book to read or  an iPod/MP3 player to listen to music as you wait for your procedure to start.  Remove all body piercing jewelry and leave at home.  Total time from sign-in until discharge is approximately 2-3 hours.  You should go home directly after your procedure and rest.  You can resume normal activities the  day after your procedure.  The day of your procedure you should not:   Drive   Make legal decisions   Operate machinery   Drink alcohol   Return to work  You will receive specific  instructions about eating, activities and medications before you leave.    The above instructions have been reviewed and explained to me by   _______________________    I fully understand and can verbalize these instructions _____________________________ Date _________

## 2010-09-19 NOTE — Assessment & Plan Note (Signed)
Summary: MOLE REMOVAL/CJR   Vital Signs:  Patient profile:   65 year old female Weight:      166 pounds Temp:     98.6 degrees F oral BP sitting:   120 / 72  Vitals Entered By: Lynann Beaver CMA (June 05, 2010 10:07 AM)  Procedure Note Last Tetanus: Tdap (07/12/2007)  Mole Biopsy/Removal: Onset of lesion: months  Procedure # 1: elliptical incision with 3 mm margin    Size (in cm): 0.4 x 0.4    Location: lwft scapular area    Comment: verbal consent    Instrument used: #10 blade    Anesthesia: 1% lidocaine w/epinephrine    Size (in cm): 0.3 x 0.3    Location: anterior neck    Comment: same technique as #1  CC: mole removal Is Patient Diabetic? No Pain Assessment Patient in pain? no        CC:  mole removal.  Allergies: 1)  ! Oxycontin (Oxycodone Hcl) 2)  Penicillin G Potassium (Penicillin G Potassium)   Impression & Recommendations: mole removal x 2  Complete Medication List: 1)  Clobetasol Propionate 0.05 % Soln (Clobetasol propionate) .... Apply as directed as needed 2)  Metoprolol Tartrate 50 Mg Tabs (Metoprolol tartrate) .... Take 1/2 tablet by mouth twice a day 3)  Nitroglycerin 0.4 Mg Subl (Nitroglycerin) .... Place 1 tablet under tongue as directed as needed 4)  Plavix 75 Mg Tabs (Clopidogrel bisulfate) .... Take 1 tablet by mouth once a day 5)  Vytorin 10-80 Mg Tabs (Ezetimibe-simvastatin) .... Take 1 tablet by mouth at bedtime 6)  Tramadol Hcl 50 Mg Tabs (Tramadol hcl) .... One by mouth bid 7)  Zolpidem Tartrate 10 Mg Tabs (Zolpidem tartrate) .... Take 1 tablet by mouth at bedtime --rx must last 30 days 8)  Fluoxetine Hcl 40 Mg Caps (Fluoxetine hcl) .Marland Kitchen.. 1 by mouth daily 9)  Nexium 40 Mg Cpdr (Esomeprazole magnesium) .... Take one by mouth once daily 10)  Moviprep 100 Gm Solr (Peg-kcl-nacl-nasulf-na asc-c) .... As per prep instructions. 11)  Phillips Colon Health Caps (Probiotic product) .... Take one by mouth once daily  Other Orders: Shave  Skin Lesion <0.5cm Scalp/neck/hands/feet/genitalia (91478) Shave Skin Lesion < 0.5 cm/trunk/arm/leg (11300) Prescriptions: CLOBETASOL PROPIONATE 0.05 % SOLN (CLOBETASOL PROPIONATE) Apply as directed as needed  #1 bottle x 1   Entered and Authorized by:   Birdie Sons MD   Signed by:   Birdie Sons MD on 06/05/2010   Method used:   Print then Give to Patient   RxID:   [id number] FLUOXETINE HCL 40 MG CAPS (FLUOXETINE HCL) 1 by mouth daily  #90 x 3   Entered and Authorized by:   Birdie Sons MD   Signed by:   Birdie Sons MD on 06/05/2010   Method used:   Print then Give to Patient   RxID:   [id number] ZOLPIDEM TARTRATE 10 MG TABS (ZOLPIDEM TARTRATE) Take 1 tablet by mouth at bedtime --Rx must last 30 days  #30 x 1   Entered and Authorized by:   Birdie Sons MD   Signed by:   Birdie Sons MD on 06/05/2010   Method used:   Print then Give to Patient   RxID:   [id number]    Orders Added: 1)  Shave Skin Lesion <0.5cm Scalp/neck/hands/feet/genitalia [11305] 2)  Shave Skin Lesion < 0.5 cm/trunk/arm/leg [11300]

## 2010-11-05 ENCOUNTER — Encounter: Payer: Self-pay | Admitting: Internal Medicine

## 2010-11-06 ENCOUNTER — Encounter: Payer: Self-pay | Admitting: Internal Medicine

## 2010-11-06 ENCOUNTER — Ambulatory Visit (INDEPENDENT_AMBULATORY_CARE_PROVIDER_SITE_OTHER): Payer: Managed Care, Other (non HMO) | Admitting: Internal Medicine

## 2010-11-06 DIAGNOSIS — M549 Dorsalgia, unspecified: Secondary | ICD-10-CM

## 2010-11-06 MED ORDER — METOPROLOL TARTRATE 50 MG PO TABS: ORAL_TABLET | ORAL | Status: DC

## 2010-11-06 MED ORDER — HYDROCODONE-ACETAMINOPHEN 5-500 MG PO TABS: 1.0000 | ORAL_TABLET | ORAL | Status: DC | PRN

## 2010-11-06 MED ORDER — METHYLPREDNISOLONE (PAK) 4 MG PO TABS: 4.0000 mg | ORAL_TABLET | Freq: Every day | ORAL | Status: DC

## 2010-11-06 MED ORDER — ZOLPIDEM TARTRATE 10 MG PO TABS: 10.0000 mg | ORAL_TABLET | Freq: Every evening | ORAL | Status: DC | PRN

## 2010-11-06 MED ORDER — CLOPIDOGREL BISULFATE 75 MG PO TABS: 75.0000 mg | ORAL_TABLET | Freq: Every day | ORAL | Status: DC

## 2010-11-22 ENCOUNTER — Encounter: Payer: Self-pay | Admitting: Internal Medicine

## 2010-11-22 DIAGNOSIS — M549 Dorsalgia, unspecified: Secondary | ICD-10-CM | POA: Insufficient documentation

## 2010-11-22 NOTE — Progress Notes (Signed)
  Subjective:    Patient ID: Bianca [name], female    DOB: 01-Jun-1946, 65 y.o.   MRN: [id number]  HPI Pt presents to clinic for evaluation of back pain. Notes 2wk h/o increasing pain and pressure in low back. Notes bilateral radicular leg pain without new numbness, tingling, weakness or urinary incontinence. Taking ultram without improvement. No recent injury/trauma. Has h/o back surgery in past and notes fixed leg left numbness.    Review of Systems see hpi     Objective:   Physical Exam  Constitutional: She appears well-developed and well-nourished. No distress.  HENT:  Head: Normocephalic and atraumatic.  Eyes: Conjunctivae are normal. No scleral icterus.  Musculoskeletal:       Midline lumbar back without tenderness, muscle spasm or bony abn. Bilateral le strength 5/5. Gait nl. Modified SLR negative bilaterally.  Neurological: She is alert.  Skin: Skin is warm and dry. She is not diaphoretic.  Psychiatric: She has a normal mood and affect.          Assessment & Plan:

## 2010-11-22 NOTE — Assessment & Plan Note (Signed)
Stop ultram. Begin lortab prn. Cautioned re: possible sedation, addiction and/or tolerance. Attempt medrol dosepak. F/u if no improvement or worsening.

## 2010-11-24 LAB — COMPREHENSIVE METABOLIC PANEL
ALT: 20 U/L (ref 0–35)
AST: 26 U/L (ref 0–37)
AST: 30 U/L (ref 0–37)
Albumin: 3.6 g/dL (ref 3.5–5.2)
Alkaline Phosphatase: 76 U/L (ref 39–117)
BUN: 8 mg/dL (ref 6–23)
CO2: 25 mEq/L (ref 19–32)
Chloride: 104 mEq/L (ref 96–112)
Chloride: 107 mEq/L (ref 96–112)
Creatinine, Ser: 0.82 mg/dL (ref 0.4–1.2)
GFR calc Af Amer: >60 mL/min (ref >60)
GFR calc Af Amer: >60 mL/min (ref >60)
GFR calc non Af Amer: >60 mL/min (ref >60)
Glucose, Bld: 102 mg/dL — ABNORMAL HIGH (ref 70–99)
Potassium: 4.1 mEq/L (ref 3.5–5.1)
Sodium: 135 mEq/L (ref 135–145)
Total Bilirubin: 0.8 mg/dL (ref 0.3–1.2)
Total Protein: 6 g/dL (ref 6.0–8.3)

## 2010-11-24 LAB — LIPID PANEL
Cholesterol: 161 mg/dL (ref 0–200)
HDL: 44 mg/dL (ref >39)
LDL Cholesterol: 87 mg/dL (ref 0–99)
Triglycerides: 152 mg/dL — ABNORMAL HIGH (ref <150)

## 2010-11-24 LAB — BASIC METABOLIC PANEL
BUN: 6 mg/dL (ref 6–23)
GFR calc Af Amer: >60 mL/min (ref >60)
GFR calc non Af Amer: >60 mL/min (ref >60)
Potassium: 3.8 mEq/L (ref 3.5–5.1)
Sodium: 135 mEq/L (ref 135–145)

## 2010-11-24 LAB — CK TOTAL AND CKMB (NOT AT ARMC)
Relative Index: INVALID (ref 0.0–2.5)
Total CK: 90 U/L (ref 7–177)

## 2010-11-24 LAB — CARDIAC PANEL(CRET KIN+CKTOT+MB+TROPI)
CK, MB: 2.3 ng/mL (ref 0.3–4.0)
CK, MB: 2.8 ng/mL (ref 0.3–4.0)
Total CK: 72 U/L (ref 7–177)
Total CK: 79 U/L (ref 7–177)
Troponin I: 0.01 ng/mL (ref 0.00–0.06)

## 2010-11-24 LAB — HEMOGLOBIN A1C: Mean Plasma Glucose: 120 mg/dL

## 2010-11-24 LAB — DIFFERENTIAL
Basophils Relative: 0 % (ref 0–1)
Eosinophils Absolute: 0.1 10*3/uL (ref 0.0–0.7)
Eosinophils Relative: 1 % (ref 0–5)
Neutrophils Relative %: 55 % (ref 43–77)

## 2010-11-24 LAB — PROTIME-INR: Prothrombin Time: 13 seconds (ref 11.6–15.2)

## 2010-11-24 LAB — TROPONIN I: Troponin I: 0.01 ng/mL (ref 0.00–0.06)

## 2010-11-24 LAB — CBC
HCT: 34.4 % — ABNORMAL LOW (ref 36.0–46.0)
HCT: 38.3 % (ref 36.0–46.0)
HCT: 39.6 % (ref 36.0–46.0)
Hemoglobin: 13.5 g/dL (ref 12.0–15.0)
Hemoglobin: 13.7 g/dL (ref 12.0–15.0)
MCV: 93.6 fL (ref 78.0–100.0)
Platelets: 179 10*3/uL (ref 150–400)
Platelets: 201 10*3/uL (ref 150–400)
Platelets: 222 10*3/uL (ref 150–400)
RBC: 3.69 MIL/uL — ABNORMAL LOW (ref 3.87–5.11)
RBC: 4.09 MIL/uL (ref 3.87–5.11)
WBC: 5.9 10*3/uL (ref 4.0–10.5)
WBC: 6 10*3/uL (ref 4.0–10.5)
WBC: 6.2 10*3/uL (ref 4.0–10.5)

## 2010-11-24 LAB — APTT: aPTT: 166 seconds — ABNORMAL HIGH (ref 24–37)

## 2010-11-24 LAB — D-DIMER, QUANTITATIVE: D-Dimer, Quant: 0.65 ug/mL-FEU — ABNORMAL HIGH (ref 0.00–0.48)

## 2010-11-24 LAB — TSH: TSH: 4.848 u[IU]/mL — ABNORMAL HIGH (ref 0.350–4.500)

## 2010-11-24 LAB — POCT CARDIAC MARKERS
Myoglobin, poc: 80.9 ng/mL (ref 12–200)
Troponin i, poc: <0.05 ng/mL (ref 0.00–0.09)

## 2010-12-04 ENCOUNTER — Other Ambulatory Visit: Payer: Self-pay | Admitting: *Deleted

## 2010-12-04 MED ORDER — FLUOXETINE HCL 40 MG PO CAPS: 40.0000 mg | ORAL_CAPSULE | Freq: Every day | ORAL | Status: DC

## 2010-12-31 NOTE — Consult Note (Signed)
NAMEJAMY, Bianca [name]                ACCOUNT NO.:  [id number]   MEDICAL RECORD NO.:  [id number]          PATIENT TYPE:  EMS   LOCATION:  MAJO                         FACILITY:  [facility]   PHYSICIAN:  Wendi Snipes, MD DATE OF BIRTH:  07-31-1946   DATE OF CONSULTATION:  03/15/2009  DATE OF DISCHARGE:                                 CONSULTATION   REASON FOR CONSULTATION:  Chest pain.   PRIMARY CARDIOLOGIST:  Dr. Tenny Craw.   PRIMARY DOCTOR:  Dr. Birdie Sons.   CHIEF COMPLAINT:  Chest pain.   HISTORY OF PRESENT ILLNESS:  This is a 65 year old white female with a  history of coronary disease, status post PCI to the right coronary  artery, here with chest pain.  She reports the symptoms have occurred  over the past day or two and are described as chest tightness associated  with occasional sharp stabbing pain that radiates to her back and down  her left small arm.  She states that she has no associated shortness of  breath, diaphoresis, nausea, syncope, presyncope, or palpitations.  She  states it first occurred yesterday while at rest, and these symptoms  have occurred sporadically since then.  She states the symptoms were  more severe than her previous symptoms associated with her MI.  She does  not acknowledge that her blood pressure has been elevated for the last  few days and has not been recently ill in other regards.   PAST MEDICAL HISTORY:  1. Coronary artery disease, status post myocardial infarction with a      PCI to the right coronary in 1997 and subsequent non-STIMI and      repeat PCI to the right coronary artery x3 in 2007.  2. Degenerative joint disease, status post spinal fusion at age 65.  3. Hyperlipidemia.  4. Hypertension.   ALLERGIES:  PENICILLIN.   MEDICATIONS ON ADMISSION:  1. Metoprolol 25 mg twice daily.  2. Plavix 75 mg daily.  3. Vytorin 80/10 mg daily.  4. Aspirin 81 mg daily.  5. Protonix 40 mg daily.   SOCIAL HISTORY:  She lives in [place]  with husband.  She currently  does not smoke.  However, has a 20 pack-year history, quit in 2008.   FAMILY HISTORY:  Mother had diabetes.   REVIEW OF SYSTEMS:  All 14 systems were reviewed and were negative  except as mentioned detail in the HPI.   PHYSICAL EXAMINATION:  Her blood pressure is 162/96, respiratory rate  16.  Her pulse is 53.  She is satting at 99% on room air.  GENERAL:  She is a 65 year old white female appearing her stated age in  no acute distress.  HEENT:  Moist mucous membranes.  Pupils equal, round, react to light  accommodation.  Anicteric sclera.  NECK:  No jugular venous distention.  No thyromegaly.  CARDIOVASCULAR:  Regular rate and rhythm.  No murmurs, rubs or gallops.  LUNGS:  Clear to auscultation bilaterally.  ABDOMEN:  Nontender, nondistended plus bowel sounds.  No masses.  EXTREMITIES:  She has trace lower extremity edema bilaterally to the  ankles.  NEUROLOGIC:  Alert and x3.  Cranial nerves II-XII grossly intact.  No  focal neurologic deficit.  SKIN:  Warm, dry and intact.  No rashes.  PSYCH:  Mood and affect are appropriate.   RADIOLOGY:  Chest x-ray showed emphysematous changes and but no acute  cardiopulmonary process.   EKG showed normal sinus rhythm with a rate of 53 beats per minute with  no ST or T-wave abnormalities suggestive of recent or ongoing ischemia.   LABORATORY REVIEW:  White blood cell count 6.0, hematocrit is 40.  Her  platelet count is 222.  Her potassium is 4.1.  Her BUN is 7.  Her  creatinine is 0.76.  Her blood sugar is his 102.  Her troponin is less  than 0.05, and CK-MB is less than 1.   ASSESSMENT/PLAN:  This is a 65 year old white female with a history of  coronary disease, status post percutaneous intervention to the right  coronary artery, hypertension, hyperlipidemia, here with approximately  24 hours of sporadic chest pain.  1. Unstable angina:  Her symptoms are somewhat atypical.  However,      agree with  heparin and dual antiplatelet therapy while she rules      out for myocardial infarction through serial cardiac enzymes.      There is no current objective evidence for ongoing ischemia.      Consider noninvasive risk ratification in the morning.  2. Hypertension:  Her blood pressure is currently elevated.  Please      check a D-dimer to rule out an aortic dissection if this is      elevated.  Blood pressure is currently elevated.  Please check a D-      dimer to rule out aortic dissection.  She had no widened      mediastinum on chest x-ray; however, if her D-dimer is elevated,      would recommend a CTA in context of her symptoms and elevated blood      pressure.  She will need further therapy for blood pressure.  If it      continues be elevated after acute setting, suggest beginning with      an ACE inhibitor or hydrochlorothiazide.  3. Hyperlipidemia:  Continue current statin therapy.      Wendi Snipes, MD  Electronically Signed     BHH/MEDQ  D:  03/15/2009  T:  03/15/2009  Job:  [id number]

## 2010-12-31 NOTE — Op Note (Signed)
Bianca [name], Bianca [name]                ACCOUNT NO.:  [id number]   MEDICAL RECORD NO.:  [id number]          PATIENT TYPE:  INP   LOCATION:  3002                         FACILITY:  [facility]   PHYSICIAN:  Hilda Lias, M.D.   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  Chronic back pain.  Pseudomeningocele.   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  Chronic back pain.  Pseudomeningocele.   PROCEDURE:  Bilateral L4-5 laminectomy, decompression of the thecal sac,  repair of the pseudomeningocele, foraminotomy, posterolateral  arthrodesis with BMP autograft from L4-5 and Cell saver.   SURGEON:  Hilda Lias, MD   ASSISTANT:  Payton Doughty, MD   MEDICAL HISTORY:  Bianca [name] is a 65 year old female, when she was 65  years old, she underwent in situ fusion at the level of L5-S1 for a  grade 4 spondylolisthesis.  The patient did well.  I have been following  her for more than 3 years, complaining of back pain radiating to both  legs.  X-rays show severe stenosis at L4-5.  Surgery was advised.  Eventually, the patient decided several weeks ago that she was worse and  she wanted to proceed with surgery.  Idea of the procedure was to do a  L4-5 decompression with fusion with using pedicle screws and interbody  fusion.  Also, posterolateral arthrodesis.  The risks were explained to  her including the possibility of CSF leak secondary to her  pseudomeningocele.   PROCEDURE:  The patient was taken to the OR and she was positioned in a  prone manner.  The back was cleaned with DuraPrep and midline incision  following the previous one.  Removal of previous scar was done.   X-rays  were taken several times because indeed there was no anatomy.  The  patient had a good solid fusion.  I was unable to see or feel either the  spinous process of L4-5 or the upper from S1.  That area was completely  solid mass of bone graft.  Nevertheless, after retraction, we were able  to identify one of the spinous process and we worked our way in between  the spinous process and the interspinous ligament.  X-rays showed that  indeed we were at the level of L4-5.  Two x-rays were taken, and this  was consulted with the radiologist on-call.  Then, we pulled the spinous  process of 4 and 5 and there was a minimal movement.  Dissection was  carried in the midline with the drill and we were into the spinal canal.  Spinal canal was quite narrow, and the patient had quite a bit of  adhesion.  The pseudomeningocele in the upper part of L5 as well as  thickening of yellow ligament.  Using the 1 and 2  mm Kerrison punch, we  decompressed the spinal cord and the thecal sac and we were able to  accomplish a foraminotomy.  Then because there was a large upper part of  the pseudomeningocele, we opened the dura mater.  We we able to repair  the upper part of the pseudomeningocele to give more space to the thecal  sac.  Because there was minimal movement, we decided not to proceed with  any type of pedicle screws.  The patient had almost no space between the  disk space.  Then using the drill, we drilled the lateral aspect of the  mass which was the area where the facet was medial.  We  were able to identify the lateral process.  Then, a mix of master graft,  BMP, and autograft was used for solid posterolateral arthrodesis.  Then,  a Tisseel was left on top of the dura mater where the pseudomeningocele  was repaired.  Valsalva maneuver was negative.  From then on, the wound  was closed with Vicryl and nylon.           ______________________________  Hilda Lias, M.D.     EB/MEDQ  D:   04/11/2008  T:  04/12/2008  Job:  [id number]

## 2010-12-31 NOTE — Discharge Summary (Signed)
Bianca [name], Bianca [name]                ACCOUNT NO.:  [id number]   MEDICAL RECORD NO.:  [id number]          PATIENT TYPE:  OBV   LOCATION:  1527                         [facility]   PHYSICIAN:  Valerie A. Felicity Coyer, MDDATE OF BIRTH:  1946/05/12   DATE OF ADMISSION:  01/11/2008  DATE OF DISCHARGE:  01/12/2008                               DISCHARGE SUMMARY   DISCHARGE DIAGNOSES:  1. Nausea, vomiting, and diarrhea, suspect infectious gastroenteritis,      improved upper gastrointestinal symptoms exacerbated by below.  See      next.  2. Chronic cough greater than three months without specific      abnormality.  Chest x-ray unremarkable.  O2 sats good.  Recent CT      sinuses May 21 negative.  Medical cough suppression and outpatient      pulmonary evaluation.  See details below.  3. History of coronary disease status post Taxus stent x3, December      2007.  4. Dyslipidemia.  5. Hypertension.  6. Chronic low back pain with history of spinal fusion, age 17.      Outpatient follow-up with neurosurgery for evaluation of possible      surgery.  7. History of depression and insomnia.  8. Tobacco abuse disorder, cessation recommended.   DISCHARGE MEDICATIONS:  1. Mucinex DM over-the-counter 2 tablets p.o. b.i.d. scheduled.  2. Protonix 40 mg p.o. b.i.d. scheduled.  3. Flonase nasal spray q.a.m. scheduled.  4. Albuterol inhaler 2 puffs q.4 h. p.r.n. cough.   Other medications are as prior to admission and include:  1. Metoprolol 25 mg p.o. b.i.d.  2. Aspirin 81 mg daily.  3. Vytorin 10/80 p.o. daily.  4. Prozac 20 mg daily.  5. Plavix 75 mg daily.  6. Phenergan 12.5 one to two p.o. q. 4-6 hours p.r.n. nausea.  7. Ambien 10 mg q.h.s. p.r.n.  8. Vicodin ES 7.5/750 one p.o. b.i.d. p.r.n.   DISPOSITION:  The patient is discharged home in medically stable  condition after overnight observation for her GI symptoms.  She is  tolerating p.o., hemodynamically stable though still with  cough  symptoms.  Outpatient follow-up is with primary care physician, Dr.  Birdie Sons scheduled for June 5 at 9:50 a.m. to evaluate general  symptoms; also pulmonology, Dr. Marcelyn Bruins for June 10 at 11:30 a.m.  The patient has been instructed to discontinue smoking.   HOSPITAL COURSE:  1. Nausea, vomiting with diarrhea.  The patient is a 65 year old woman      with chronic low back pain who has been having ongoing evaluation      of colds symptoms and cough in the last 3 months, twice treated      with antibiotics though with recent negative CT evaluation for      sinus disease.  Now to the emergency room due to 3 days of vomiting      associated after her coughing spells, chronic nausea with her upper      head drainage and new diarrhea any time she ate.  She had been      unable  to tolerate anything more than clears and because of these      symptoms and progressive weakness with concern for dehydration, she      came to the ER for evaluation.  All laboratory data was negative      including a normal white count, normal electrolytes, normal LFTs,      no anemia, normal calcium and platelet count.  A 2-view chest x-ray      was without acute disease and her abdominal exam was benign though      she was coughing and appeared fatigued.  She was admitted for      observation to give IV fluid and treated symptomatically with      Zofran and Imodium.  A C. diff toxin was checked and pending at      this time, given history of antibiotics in the last several weeks,      but clinically does not appear to be such.  There are no GI bleed      symptoms and suspect overall infectious gastroenteritis, perhaps      viral given family members with similar GI upset symptoms.  Suspect      her vomiting was exacerbated by her chronic cough.  Please see      details of #2.  Given her tolerance of p.o., hemodynamic stability,      and no other specific abnormality identified, the patient is felt       stable for discharge home regarding her acute GI abnormalities with      continued outpatient follow-up for her cough as listed below.  2. Chronic cough.  This has been ongoing cold  symptoms for multiple      weeks and this patient is a smoker.  The patient has been advised      to discontinue smoking use.  I have reviewed her previous workup.      Chest x-ray in the emergency room is without acute disease, no      infiltrate or effusion.  Recent CT of sinuses May 21 done as an      outpatient was negative for any acute or chronic disease.  I have      discussed with the patient the concept of cough reflux and need for      voice rest, scheduled aggressive reflux control and cough      suppression.  As such we have placed her on twice daily proton pump      inhibitor and scheduled over-the-counter Mucinex.  We have tried a      trial of albuterol to see if this helps with her cough symptoms      though with the patient's anxiety, she may be unable to tolerate      this.  I have also considered Reglan, but given her acute symptoms      of GI diarrhea, this has been held at the time.  I have also      arranged for outpatient follow-up with pulmonologist Dr. Marcelyn Bruins June 10 as scheduled above.  Further recommendations to      follow from this evaluation.  3. History of coronary disease.  The patient's cardiac medications for      her stents were continued as prior.  There was no evidence of      anginal equivalent or chest pain active symptoms this      hospitalization.  Outpatient follow-up with Dr. Tenny Craw as needed.  4. Dyslipidemia.  Continue home meds.  5. Hypertension.  Continue home meds.  Note:  The patient is not on      ACE inhibitor or ARB which might be contributing to her cough      symptoms.  6. Depression and anxiety.  Continue home treatment as prior.  7. Chronic low back pain.  Outpatient follow-up with neurosurgery as      scheduled.  Continue her p.r.n. Vicodin  as prior to admission with      noting intolerance to OxyContin as a long-acting narcotic recently      prescribed by her MD.      Raenette Rover. Felicity Coyer, MD  Electronically Signed     VAL/MEDQ  D:  01/12/2008  T:  01/12/2008  Job:  [id number]   cc:   Barbaraann Share, MD,FCCP  [address]  [place]  [place] [address]

## 2010-12-31 NOTE — H&P (Signed)
Bianca [name], Bianca [name]                ACCOUNT NO.:  [id number]   MEDICAL RECORD NO.:  [id number]          PATIENT TYPE:  EMS   LOCATION:  MAJO                         FACILITY:  [facility]   PHYSICIAN:  Michiel Cowboy, MDDATE OF BIRTH:  [birth year]   DATE OF ADMISSION:  03/15/2009  DATE OF DISCHARGE:                              HISTORY & PHYSICAL   PRIMARY CARE PHYSICIAN:  Dr. Valetta Mole. Swords   CHIEF COMPLAINT:  Chest pain.   HISTORY OF PRESENT ILLNESS:  The patient is a 65 year old female with  known history of coronary artery disease and hypertension who continues  to smoke.  The patient has started to have intermittent chest pain x2  days.  Yesterday the pain started with activity and was coming and  going.  Occasionally she would have sharp,  shooting pains radiating to  her  back and left arm but she also has underlying chest pressure that  comes and goes and feels like a fairly significant severe pressure over  the left side of her chest and makes her very uncomfortable when it does  come on.  She denies any shortness of breath but had some nausea  associated with this.  Her symptoms are somewhat relieved by nitro but  not completely.   Otherwise review of systems are negative.  No coughing, no pleuritic  component to her chest pain.  She does report that she had been noticing  a mild swelling of her legs bilaterally when she stands up on her feet  for too long. Otherwise no diarrhea or constipation.  Otherwise review  of systems is negative.   PAST MEDICAL HISTORY:  Significant for:  1. Coronary artery disease status post four stents done by Dr. Tenny Craw      and status post MI in 2007.  2. Chronic back pain status post surgery.  3. Hypertension.  4. Tobacco abuse.  5. Dyslipidemia.   SOCIAL HISTORY:  The patient continues to smoke  on and off. interested  in quitting.   SOCIAL HISTORY:  Does not drink, does not abuse drugs.   FAMILY HISTORY:  Significant for late onset  coronary artery disease in  her mother and diabetes.   ALLERGIES:  CECLOR, PENICILLINS AND OXYCONTIN.   MEDICATIONS:  1. Aspirin 81 mg p.o.  daily.  Plavix 75 mg daily,  2. Vytorin 80 mg daily.  3. Ambien as needed at bedtime.  4. Metoprolol  25 mg b.i.d.  5. Tramadol as needed for pain.   PHYSICAL EXAMINATION:  VITAL SIGNS:  Temperature 98.0, blood pressure  122/66, pulse 58, respirations 16, saturating  95% on room air.  GENERAL:  The patient appears to be in no acute distress.  HEENT:  Head- nontraumatic.  Moist mucous membranes.  LUNGS:  Clear to auscultation bilaterally.  HEART:  Regular rate and rhythm.  No murmurs, rubs or gallops  appreciated.  ABDOMEN:  Soft, nontender.  EXTREMITIES:  Lower extremity without clubbing, cyanosis or edema.  NEUROLOGIC:  The patient appears to be intact.  SKIN:  Clean, dry and intact.  Healed scar noticed over the lower  back.   LABS:  White blood cell count 6.0,  hemoglobin 13.7, sodium 135,  potassium 4.1, creatinine 0.76.  Cardiac enzymes, iSTATs are negative.  Chest x-ray showing COPD.  EKG showing slightly peaked T-waves which are  unchanged from prior and a T-wave inversion in lead V1.   ASSESSMENT AND PLAN:  1. This is a 65 year old female with known history of coronary artery      disease who presents with chest pain.  Unstable angina could not be      completely ruled out at this point. Will admit to step-down unit.      She is not chest pain free.  I have called Cardiology consult who      will be more than happy to come and see her. Will cycle cardiac      markers.  For now will put on heparin drip which was already      started by ER and continue her nitro drip and titrated.  Control      her blood pressure.  Check fasting lipid panel and hemoglobin A1c.      Encourage smoking cessation.  The patient already received full      dose of  aspirin.  Will continue her Plavix and aspirin, Vytorin      and  metoprolol.  2. History of  hypertension.  Continue metoprolol.  Control blood      pressure in the setting of chest pain.  3. Prophylaxis.  Protonix plus heparin drip.      Michiel Cowboy, MD  Electronically Signed     AVD/MEDQ  D:  03/15/2009  T:  03/15/2009  Job:  [id number]   cc:   Valetta Mole. Swords, MD

## 2010-12-31 NOTE — Assessment & Plan Note (Signed)
[facility]                            CARDIOLOGY OFFICE NOTE   Tavi, Gaughran Bianca [name]                       MRN:          [id number]  DATE:03/02/2008                            DOB:          03-Nov-1945    IDENTIFICATION:  The patient is a 65 year old woman who I follow for  coronary artery disease.  She was last seen in February 2008.  She is  status post MI back in 2007 (December).  She had a 25% LAD lesion, 80%  mid distal LAD lesion, RCA had 100% occlusion before a stent, circumflex  has 25% lesion, and 50% OM lesion.   The patient underwent PTCA stent x3 (Taxus stents) to the RCA.  Plan and  recommendation was for lifelong aspirin and Plavix.  Note in April 2008,  she had a normal stress Myoview, again showing no significant ischemia  from the mid distal LAD lesion.   Since seen, her biggest complaint is her back problems, which have  limited her activity.  She does have some good days and bad.   On the good days when she is able to do things, she denies chest  pressure, and no shortness of breath.   CURRENT MEDICINES:  1. Metoprolol.  2. Calcium.  3. Plavix 75.  4. Aspirin 81.  5. Fluoxetine 40.  6. Vytorin 10/80.  7. Fish oil 500 daily.  8. Tramadol 50 b.i.d.   PHYSICAL EXAMINATION:  GENERAL:  The patient is in no distress.  VITAL SIGNS:  Blood pressure 125/73, pulse 58 and regular, and weight  164.  NECK:  JVP is normal.  LUNGS:  Clear.  CARDIAC:  Regular rate and rhythm.  S1 and S2.  No S3, no murmurs.  ABDOMEN:  Benign.  EXTREMITIES:  No edema.   A 12-lead EKG shows normal sinus bradycardia at 55 beats per minute.  T-  wave inversion in leads I, V1, and V2.   IMPRESSION:  1. Coronary artery disease,  appears to be doing okay.  She does have      some days, where she is able to pick up her physical activity, so I      am not concerned that there is any limitation from a cardiac      standpoint.  Overall, I think, she is at a  relatively low risk for      any major cardiac event.  I do not think further testing would      change this, and I think it is okay to proceed with surgery without      any further testing.  She should maintain the Plavix therapy as      long as possible if it needs to be discontinued before surgery, it      should be resumed as quickly as possible.  I would recommend      staying on aspirin if at all possible, but again to discontinue for      the least amount of time.  2. Dyslipidemia.  The patient had just a direct LDL noted to be drawn  recently, which was 87, and she is fasting today.  We will check a      lipometabolic panel.   PLAN:  I will set to see the patient back in about 9-10 months, and  again we will follow up with her on the blood work.  She will follow up  with Dr. Jeral Fruit.     Pricilla Riffle, MD, [facility]  Electronically Signed    PVR/MedQ  DD: 03/02/2008  DT: 03/03/2008  Job #: [id number]   cc:   Sharlot Gowda, M.D.  Hilda Lias, M.D.  Bruce Rexene Edison Swords, MD

## 2010-12-31 NOTE — Discharge Summary (Signed)
NAMEELZENA, Bianca [name]                ACCOUNT NO.:  [id number]   MEDICAL RECORD NO.:  [id number]          PATIENT TYPE:  INP   LOCATION:  2011                         FACILITY:  [facility]   PHYSICIAN:  Raenette Rover. Felicity Coyer, MDDATE OF BIRTH:  Nov 04, 1945   DATE OF ADMISSION:  03/15/2009  DATE OF DISCHARGE:  03/17/2009                               DISCHARGE SUMMARY   DISCHARGE DIAGNOSES:  1. Atypical chest pain, resolved.  Negative cardiac catheterization on      March 16, 2009, in her immediate probability V/Q scan, but likely      representative of chronic obstructive pulmonary disease.  See      details below.  Discharged home with outpatient followup at primary      MD.  2. Ongoing tobacco abuse with chronic obstructive pulmonary disease.      No acute exacerbation.  3. Chronic pain, continue tramadol p.r.n. with outpatient followup.  4. History of coronary artery disease, status post myocardial      infarction with percutaneous coronary intervention to right      coronary in 1997 and repeat myocardial infarction with percutaneous      coronary intervention to right coronary x3 in 2007.  Continue      medical management.  Negative cardiac catheterization this      admission.  5. Degenerative joint disease, status post spinal fusion at age 53.  6. Dyslipidemia, treatment.  7. Hypertension, ongoing treatment.   DISCHARGE MEDICATIONS:  As prior to admission, include:  1. Vytorin 10/80 one p.o. daily.  2. Plavix 75 mg daily.  3. Metoprolol 50 mg half tablet b.i.d.  4. Tramadol 50 mg p.o. b.i.d. p.r.n.  5. Nitroglycerin 0.4 mg sublingual p.r.n. chest pain.  6. Ambien 10 mg nightly p.r.n.  7. Aspirin 81 mg daily.  8. New prescription, Imdur 30 mg p.o. daily, dispense 30 provided.   Hospital followup will be with primary care physician, Dr. Birdie Sons  to be arranged for next week.  The patient instructed that if she has  worsening pain, shortness of breath, fever, cough, or other  problems  prior to this followup time, she should return to the emergency room or  call for further evaluation and advice.   CONDITION ON DISCHARGE:  Medically improved and stable, asymptomatic,  and anxious for discharge home.   HOSPITAL COURSE BY PROBLEM:  1. Chest pain.  The patient is a 65 year old woman with ongoing      tobacco abuse, known history of coronary disease, and multiple      ongoing risk factors for continued atherosclerosis who came to the      emergency room due to complaints of pain.  Please see admission H      and P for further information.  She was seen in the consultation by      [facility] who recommended telemetry and cycling      cardiac enzymes to rule out acute MI.  Fortunately, these were      negative, but due to her high risk and known history of disease, it  was felt appropriate to undergo cardiac catheterization for further      evaluation.  This was performed on March 16, 2009, and fortunately      showed no significant change in her coronary anatomy.  Normal LVEF      which was also confirmed by 2D echo this hospitalization.      Cardiology has recommended continued medical management of these      issues with again reminding the patient to discontinue tobacco      abuse.  2. Imdur has been added to her regimen.  So it is unclear that this      alone is the cause for improvement of her symptoms.  A V/Q scan was      performed due to a minimally elevated D-dimer at 0.6 and this was      read as intermediate probability for PE due to air trapping in the      right upper and right lower lobes.  Radiology notes that this also      may be due to COPD, which the patient has risk factors and chest x-      ray changes consistent with this diagnosis.  She has had no      hypoxia.  No lower extremity swelling.  No tachycardia and again is      feeling well.  At this time, we will presume V/Q scan changes are      due to COPD.  The patient  is instructed on the nature and symptoms      of PE and further followup with primary MD would be appropriate to      consider perhaps outpatient CT angio following further time out      from cardiac catheterization or lower extremity Dopplers as      warranted.  We have opted against treating with full-dose      anticoagulation at this time at patient preference, which I feel is      reasonable given the lack of other symptoms.  3. Chronic pain.  The patient does have chronic pain issues related to      degenerative disk diseases.  I have recommended to her at this time      to continue tramadol as ongoing and prescribed by her primary MD      prior to admission with further workup and followup as indicated on      and on hospitalized basis.  Please see hospital chart for further      details.  The patient's other chronic medical issues have been      addressed and her home medications are continued as prior to      admission without change.   Over 30 minutes spent on the day of discharge planning for coordination  of care and review of hospitalization.      Valerie A. Felicity Coyer, MD  Electronically Signed     VAL/MEDQ  D:  03/17/2009  T:  03/18/2009  Job:  [id number]

## 2010-12-31 NOTE — H&P (Signed)
Bianca [name], Bianca [name]                ACCOUNT NO.:  [id number]   MEDICAL RECORD NO.:  [id number]          PATIENT TYPE:  INP   LOCATION:  3002                         FACILITY:  [facility]   PHYSICIAN:  Hilda Lias, M.D.   DATE OF BIRTH:  February 09, 1946   DATE OF ADMISSION:  04/11/2008  DATE OF DISCHARGE:                              HISTORY & PHYSICAL   Bianca [name] is a lady who had been followed by me in my office for several  months complaining of back pain with radiation to both the legs.  As a  matter of fact, the first time I saw her back in my office was in  September 2006.  This lady when she was 65 years old, she had a lumbar  fusion.  Right now, the pain mostly going to both the legs associated  with walking and associated with cramp and weakness.   PAST MEDICAL HISTORY:  1. Spinal fusion.  2. C-section.  3. Right foot angioplasty.   ALLERGIES:  She is allergic to PENICILLIN.   The patient does not smoke, does not drink.   FAMILY HISTORY:  Mother had diabetes.   REVIEW OF SYSTEMS:  Positive for urinary incontinence, back pain, and  high cholesterol.  The patient who came to my office on several  occasions walking with a short step.  The last time I saw her in my  office was about 2 weeks ago.   PHYSICAL EXAMINATION:  HEAD, EYES, EARS, NOSE, AND THROAT:  Normal.  NECK:  Normal.  LUNGS:  Clear.  HEART:  Sound normal.  ABDOMEN:  Normal.  EXTREMITIES:  Normal pulses.  SKIN:  She has a scar in the lumbar area.   Clinically, she has a weakened dorsiflexion.  She has a decrease of  flexibility of the lumbar spine.  Straight leg raising, SLR is positive  at 45 degrees.  The x-rays showed that she has severe stenosis at the L4-  L5 with spondylolisthesis.   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  Status  post fusion in May 2001.   RECOMMENDATIONS:  The patient is being admitted for surgery.  The  procedure will be L4-L5 diskectomy interbody fusion  with cages and  pedicle screws.  The risk was explained to her including the possibility  of CSF leak.  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  Also, the possibility of no  improvement because of the chronicity pain, CSF leak again, infection,  and need for further surgery.           ______________________________  Hilda Lias, M.D.     EB/MEDQ  D:  04/11/2008  T:  04/11/2008  Job:  [id number]

## 2011-01-03 NOTE — Cardiovascular Report (Signed)
NAME:  Bianca [name], BERMAN                    ACCOUNT NO.:  xp   MEDICAL RECORD NO.:  [id number]          PATIENT TYPE:  INP   LOCATION:  3715                         FACILITY:  [facility]   PHYSICIAN:  Rollene Rotunda, MD, FACCDATE OF BIRTH:  Oct 07, 1945   DATE OF PROCEDURE:  08/03/2006  DATE OF DISCHARGE:                            CARDIAC CATHETERIZATION   PRIMARY CARE PHYSICIAN:  Valetta Mole. Swords, MD.   PROCEDURE:  Left heart catheterization, coronary arteriography.   INDICATIONS:  Evaluate patient with unstable angina.  She had a previous  stent in 1997 to her right coronary artery.  We have no records from  this catheterization.   PROCEDURE NOTE:  Left heart catheterization was performed via the right  femoral artery.  The artery was cannulated using an anterior wall  puncture.  A # 6-French arterial sheath was inserted via the modified  Seldinger technique.  Preformed Judkins and a pigtail catheter were  utilized.  The patient tolerated the procedure well and left the lab in  stable condition.   RESULTS:   HEMODYNAMIC RESULTS:  LV 143/19, AO 141/99.   CORONARIES:  The left main was normal.   The LAD had a proximal long 25% stenosis.  It was somewhat calcified.  There was diffuse luminal irregularities throughout the vessel.  There  was a mid to distal 80% lesion.  First diagonal was small with diffuse  moderate disease.  The second diagonal was larger with proximal 25%  stenosis.   Circumflex in the AV groove had a 25% stenosis.  There was a first mid  obtuse marginal which was large with ostial 50% stenosis.  The second  obtuse marginal was small and normal.   The right coronary artery was occluded in the proximal segment.  There  was left-to-right collateral flow demonstrating a dominant vessel with a  large PDA which was free of high-grade disease.   Left ventriculogram:  The left ventriculogram was obtained in the RAO  projection.  The EF was 55% with inferior mild  akinesis.   CONCLUSION:  1. Severe two-vessel coronary artery disease.  2. Mild left ventricular dysfunction.   PLAN:  The reviewed these with Dr. Excell Seltzer.  The patient has ongoing  symptoms.  We are going to try to cross the right coronary artery and  see if we open this.  We will also consider percutaneous  revascularization of the LAD.      Rollene Rotunda, MD, [facility]  Electronically Signed     JH/MEDQ  D:  08/03/2006  T:  08/03/2006  Job:  [id number]   cc:   Valetta Mole. Cato Mulligan, MD  Pricilla Riffle, MD, [facility]

## 2011-01-03 NOTE — Discharge Summary (Signed)
Bianca [name], Bianca [name]                ACCOUNT NO.:  [id number]   MEDICAL RECORD NO.:  [id number]          PATIENT TYPE:  INP   LOCATION:  3002                         FACILITY:  [facility]   PHYSICIAN:  Hilda Lias, M.D.   DATE OF BIRTH:  [birth year]   DATE OF ADMISSION:  04/11/2008  DATE OF DISCHARGE:  04/15/2008                               DISCHARGE SUMMARY   ADMISSION DIAGNOSIS:  L4-L5 spondylolisthesis, status post fusion in May  2001.   FINAL DIAGNOSIS:  L4-L5 spondylolisthesis, status post fusion in May  2001.   CLINICAL HISTORY:  The patient was admitted because of back pain  radiating to both legs.  This lady previously has had lumbar fusion at  L5-S1.  The x-ray shows severe stenosis and spondylosis at L4-L5.   LABORATORY DATA:  Within normal limits.   COURSE IN THE HOSPITAL:  The patient was taken to surgery and  decompression and fusion of the L4-L5 was done.  After surgery, the  patient complained of some lower back pain and eventually the pain got  better.  She was discharged by my partner Dr. Phoebe Perch on April 15, 2008.   CONDITION ON DISCHARGE:  Improvement.   MEDICATIONS:  Flexeril and Darvocet.   DIET:  Regular.   ACTIVITIES:  Not to drive for at least 2 week.   FOLLOWUP:  She was to see me in my office in 3 or 4 weeks.           ______________________________  Hilda Lias, M.D.     EB/MEDQ  D:  05/16/2008  T:  05/17/2008  Job:  [id number]

## 2011-01-03 NOTE — Cardiovascular Report (Signed)
NAMEJAMILLE, FISHER                ACCOUNT NO.:  [id number]   MEDICAL RECORD NO.:  [id number]          PATIENT TYPE:  INP   LOCATION:  2807                         FACILITY:  [facility]   PHYSICIAN:  Veverly Fells. Excell Seltzer, MD  DATE OF BIRTH:  11-01-1945   DATE OF PROCEDURE:  08/03/2006  DATE OF DISCHARGE:                            CARDIAC CATHETERIZATION   INTERVENTIONAL NOTE:   PROCEDURE:  PTCA and stenting of the right coronary artery.   INDICATIONS:  Ms. Sciarra is a 65 year old female with known coronary  artery disease who presented with classic resting angina.  She did not  have ECG changes, but in the setting of her classic angina that felt  like her previous MI, she was referred for urgent cardiac  catheterization.  This was performed by Dr. Antoine Poche.  It demonstrated a  100% occluded proximal right coronary artery.  She had collaterals from  the left coronary feeding the distal right.  She was referred for  percutaneous coronary intervention in the setting of her ongoing pain.   PROCEDURAL DETAILS:  Risks and indications of the procedure were  explained to the patient.  Informed consent was obtained.  There was a 6-  [place] sheath present in the right groin.  Under normal technique, this  was changed out for a 7-French sheath, as the patient had oozing around  the sheath.  A 6-French JR-4 guiding catheter was inserted.  Angiomax  was used for anticoagulation.  Once therapeutic ACT was achieved, a  Cougar guidewire was passed into the distal right coronary artery  without difficulty.  The lesion was predilated with a 2.5 x 15 mm  balloon, and the vessel opened up at that point.  There was severe  disease in the distal right coronary artery that was treated with  primary stenting with a 2.75 x 32 mm Taxus which was deployed at 12  atmospheres.  The stent was well expanded, and there was TIMI III flow  in the vessel beyond the stent.  At this point, attention was turned to  the  proximal lesion which was a very long segment.  It was treated with  a 3.5 x 32 mm Taxus which was deployed at 12 atmospheres.  Following  stent deployment, the stent appeared well expanded and there was TIMI  III flow.  However, the proximal stent appeared slightly undersized.  The stent was postdilated with a 3.75 x 20 mm Quantum Maverick at 16  atmospheres followed by a 20 atmosphere inflation over the proximal  portion of the stent.  Following postdilatation, there was an area of  moderate stenosis between the two stents involving a short segment of  the right coronary artery.  I elected to stent that area, as well, so  that the entire area of disease would be covered.  A 3.0 x 16 mm Taxus  stent was deployed at 14 atmospheres.  The distal overlap was treated  with the Taxus balloon up to 16 atmospheres, and the proximal overlap  was treated with the Taxus balloon up to 18 atmospheres.  There was  excellent stent expansion  and no residual stenosis at the completion of  the procedure.  There was TIMI III flow at the completion of the  procedure.  At the conclusion of the intervention, an 8-French Angio-  Seal was used to seal the arteriotomy.   FINAL CONCLUSIONS:  Successful stenting of the right coronary artery  with three Taxus stents.  Recommend lifelong dual antiplatelet therapy  with aspirin and clopidogrel.     Veverly Fells. Excell Seltzer, MD  Electronically Signed    MDC/MEDQ  D:  08/03/2006  T:  08/04/2006  Job:  [id number]

## 2011-01-03 NOTE — Consult Note (Signed)
Bianca [name], LIST                ACCOUNT NO.:  [id number]   MEDICAL RECORD NO.:  [id number]          PATIENT TYPE:  INP   LOCATION:  3715                         FACILITY:  [facility]   PHYSICIAN:  Pricilla Riffle, MD, FACCDATE OF BIRTH:  [birth year]   DATE OF CONSULTATION:  08/02/2006  DATE OF DISCHARGE:                                 CONSULTATION   IDENTIFICATION:  The patient is a 65 year old woman with a history of  CAD who reports chest pain.   HISTORY OF PRESENT ILLNESS:  The patient has a known history of coronary  artery disease status post MI in 1997, previously followed by Dr. Garnette Scheuermann.  I saw her once actually in May 2003.  Prior to her MI in 1997,  she had problems with fatigability, shortness of breath on the day of  MI, indigestion, nausea and vomiting.   She says she has been under increased stress plus she has had a lot of  problems with back pain.  Questions if this leads to much of the fatigue  she has been experiencing.  She had episodic chest pressure.  Yesterday,  though, it began in the epigastric area, radiated up her chest to jaw.  These signs began with activity 6-7/10, a burning pressure.  It last  about 45 minutes, decreased and then relieved by nitroglycerin.  Associated with shortness of breath and nausea.   In the emergency room, the signs returned and relieved with three  sublingual nitroglycerin.  Signs return two more times and finally  resolved with nitro paste and morphine.   Currently painfree.   ALLERGIES:  1. PENICILLIN.  2. CECLOR.   MEDICATIONS:  Now include nitro drip, aspirin, Lovenox, Protonix 40 IV  daily, Lopressor 25 b.i.d., Vytorin 10/40 daily, aspirin 325 daily and  Prozac.   PAST MEDICAL HISTORY:  1. CAD.  2. Dyslipidemia.  3. Continued tobacco use.  4. History of DJD to the back followed by Dr. Jeral Fruit, injected,      possible surgery planned.   SOCIAL HISTORY:  The patient lives in [place] and is married.  She  has  approximately 20 pack-year history of smoking; started back about 5  years ago.  Does not drink.   FAMILY HISTORY:  Mother is alive with CAD.  Father died at age 2, no  CAD.  No siblings with CAD.   REVIEW OF SYSTEMS:  All systems reviewed.  Negative to the above problem  except as noted.   PHYSICAL EXAM:  The patient is in no distress at present.  Blood  pressure 124/78, pulse is 61, respiratory rate 20, O2 sat on 2 liters  98%.  Temperature is 97.6.  HEENT:  Normocephalic, atraumatic.  PERL.  EOMI.  Sclera clear.  Nares  clear.  Throat clear.  NECK:  JVP is normal.  No bruits.  No thyromegaly.  No JVD.  LUNGS:  Clear to auscultation.  CARDIAC EXAM:  Regular rate and rhythm, S1-S2.  No S3, S4 or murmurs.  PMI not displaced.  ABDOMINAL EXAM:  No hepatomegaly.  Supple, nontender, no masses.  EXTREMITIES:  Good distal pulses.  No lower extremity edema.  NEURO EXAM:  Alert and oriented x3.  Cranial nerves II-XII intact.  Motor 5/5 throughout.   Chest x-ray with bibasilar atelectasis. A 12-lead EKG sinus bradycardia,  56 beats per minute, no acute ST changes.   LABS:  Significant for hemoglobin of 13.9, WBC of 8.4, BUN and  creatinine of 10 and 0.7, potassium of 4.1.  Total cholesterol 186,  triglycerides 176, HDL 54, LDL 97.  TSH 1.5.  Troponin negative x2.   IMPRESSION:  The patient is a 65 year old with known coronary artery  disease.  I saw her in clinic back in 2003, now with episodes of chest  pain initially intermittent yesterday and today though with no exertion.  Relived with several nitroglycerin and nitro paste.  EKG without acute  changes.  She is currently painfree.   The patient on statin but continues to smoke.   With return of pain at rest plus multiple episodes, would recommend left  heart cath to redefine anatomy.  Planned tomorrow.  Continue statin.  Counseled on tobacco cessation.  Will continue to follow.      Pricilla Riffle, MD, [facility]  Electronically  Signed     PVR/MEDQ  D:  08/02/2006  T:  08/02/2006  Job:  [id number]

## 2011-01-03 NOTE — Assessment & Plan Note (Signed)
[facility]                            CARDIOLOGY OFFICE NOTE   [name], [name]                       MRN:          [id number]  DATE:08/26/2006                            DOB:          06/17/1946    CARDIOLOGIST:  Dr. Dietrich Pates.   PRIMARY CARE PHYSICIAN:  Valetta Mole. Swords, MD   HISTORY OF PRESENT ILLNESS:  Bianca [name] is a 65 year old female with a  history of coronary artery disease, status post myocardial infarction  1997, treated with stenting to the RCA, who presented to [facility] December 15th with chest pain.  She ruled in for non-ST  elevation myocardial infarction and was set up for cardiac  catheterization.  Her catheterization revealed proximal long 25%  stenosis in the LAD and mid and distal 80% stenosis in the LAD.  She had  a second large diagonal with a proximal 25% stenosis and circumflex with  a proximal 25% stenosis.  Mid-obtuse marginal was large with an ostial  50% stenosis.  Her culprit lesion was in the RCA, and it was 100%  occluded before the mid-stent.  She was set up for PTCA and stenting of  the RCA with Dr. Excell Seltzer.  She underwent successful stenting with 3 Taxus  stents on August 03, 2006.  She presents to the office today for  followup post-hospitalization.  She is doing well with any recurrent  chest pain or shortness of breath.  She has a lot of significant back  pain and has a history of spinal fusion at age 65.  She denies any  syncope or near-syncope or lower extremity edema.   CURRENT MEDICATIONS:  1. Metoprolol 50 mg, half tablet b.i.d.  2. Prozac 20 mEq daily.  3. Aspirin 325 mg daily.  4. Multivitamin and vitamin E.  5. Calcium.  6. Plavix 75 mg daily.  7. Vytorin 10/80 mg daily.  8. Ambien 10 mg as needed.  9. Nitroglycerin p.r.n.   ALLERGIES:  PENICILLIN CAUSES A RASH.  CECLOR.   PHYSICAL EXAMINATION:  GENERAL:  She is a well-nourished, well-developed  female in no acute distress.  VITAL SIGNS:  Blood pressure is 129/76, pulse 60, weight 165 pounds.  HEENT:  Unremarkable.  NECK:  No JVD.  CARDIAC:  Normal S1, S2.  Regular rate and rhythm without murmurs,  clicks, rubs or gallops.  LUNGS:  Clear to auscultation bilateral without wheezing, rhonchi or  rales.  ABDOMEN:  Soft, nontender with normoactive bowel sounds, no  organomegaly.  EXTREMITIES:  Without edema.  Calves are soft, nontender.  NEUROLOGIC:  She is oriented x3.  Cranial nerves 2-12 are grossly  intact.  Right groin without hematoma or bruits.   Electrocardiogram revealed a sinus bradycardia with heart rate of 56,  normal axis, no acute changes.   IMPRESSION:  1. Coronary artery disease.      a.     Status post non-ST elevation myocardial infarction December       2007.      b.     Status post Taxus stent x3 in the RCA August 03, 2006.      c.     Residual coronary artery disease as noted above.      d.     Remote history of myocardial infarction in 1997 treated with       stenting of the RCA.  2. Hypertension.  3. Treated dyslipidemia.  4. History of spinal fusion at age 78 with recent lumbar disk surgery      by Dr. Jeral Fruit.  5. Tobacco abuse.      a.     She quit 4 days ago.   PLAN:  The patient is doing well post myocardial infarction.  She will  need to stay on Plavix and aspirin, lifetime.  She notes that she may  need spinal surgery.  I explained to her that she will at least need to  stay on Plavix and aspirin for a year post intervention, before we could  consider clearing her for surgery, unless it became an emergent issue.  She understands this.  She continues with conservative management with  Dr. Jeral Fruit.  She follows with Dr. Cato Mulligan for her cholesterol.  I will  bring her back in followup with Dr. Tenny Craw in the next 6 weeks.      Tereso Newcomer, PA-C  Electronically Signed      Luis Abed, MD, [facility]  Electronically Signed   SW/MedQ  DD: 08/26/2006  DT: 08/26/2006  Job  #: [id number]   cc:   Valetta Mole. Swords, MD

## 2011-01-03 NOTE — Op Note (Signed)
[place]  Patient:    Bianca [name], Bianca [name]                       MRN: [id number] Proc. Date: 09/07/00 Adm. Date:  [id number] Attending:  Alinda Deem                           Operative Report  PREOPERATIVE DIAGNOSIS:  Loose body, left ankle.  POSTOPERATIVE DIAGNOSIS:  Loose body, left ankle.  PROCEDURE:  Left ankle arthroscopy with removal of loose body and debridement of cartilage flap tear.  SURGEON:  Alinda Deem, M.D.  FIRST ASSISTANT:  Dorthula Matas, P.A.-C.  ANESTHESIA:  General endotracheal.  ESTIMATED BLOOD LOSS:  Minimal.  FLUID REPLACEMENT:  500 cc crystalloid.  DRAINS:  None.  TOURNIQUET TIME:  None.  INDICATIONS FOR PROCEDURE:  A 65 year old woman who presented to my office with left ankle pain and x-rays showing a loose body in the lateral gutter of the left ankle.  The loose body was about 5-6 mm in size and in the interval between the talus and the lateral malleolus.  She had significant pain there and desired arthroscopic removal because of pain that prevented activities.  DESCRIPTION OF PROCEDURE:  The patient identified by arm band and taken to the operating room at [facility].  Appropriate anesthetic monitor passed and general LMA induced with the patient in the supine position.  The left lower extremity was then prepped and draped in the usual sterile fashion from the toes to the midcalf, and we began the procedure by injecting the left ankle with normal saline solution using the anterolateral portal region.  This was successful.  We then went ahead and made a standard anterolateral portal and introduced the arthroscope.  A standard anteromedial portal was then made under arthroscopic guidance and a 2.9 mm Gator sucker-shaver used to remove inflamed synovium.  There was some cartilage flap tear from the anterior aspect of the tibia, but overall the ankle joint itself, the cartilage  looked to be in pretty good condition.  We then directed our attention to the loose body in the lateral gutter.  Because of its size, we had to make an incision over the loose body, which was in a subcutaneous position, about 1.5 cm in length, and then using direct open approach, went ahead and dissected down to the loose body, which was then removed with a Therapist, nutritional and given to the patient.  The wounds were then washed out with normal saline, the arthroscopic instruments removed.  The anterolateral wound used for delivering the loose body was closed with 4-0 nylon horizontal mattress suture.  A dressing of Xeroform, 4 x 8 dressing, sponges, Webril, and Ace wrap applied, and the patient awakened and taken to the recovery room without difficulty. DD:  09/07/00 TD:  09/07/00 Job: [id number]

## 2011-01-03 NOTE — Assessment & Plan Note (Signed)
[facility]                            CARDIOLOGY OFFICE NOTE   Bianca [name], Bianca [name]                       MRN:          [id number]  DATE:10/16/2006                            DOB:          04/09/1946    IDENTIFICATION:  Bianca [name] is a 65 year old woman, history of CAD  (status post MI in 1997 with stent to the RCA; status post MI in  December 2007).  Cardiac catheterization revealed a 25% LAD, 80% mid  distal LAD, OM with a 25%, circumflex with 25%, culprit lesion in the  RCA was 100% occluded before the mid stent.  Set up for PTCA stenting of  the RCA.  Underwent Taxus stent x3.  Last seen in cardiology in January.   In the interval, she has done well, she is very active.  She says this  week she has had a dull sensation lasting a minute or two, usually  when she is doing something.  She is getting kitchen work done and the  episode began Monday when they were doing some very smelly work in [employer].  She wonders if it is associated with this.  She has not  noticed any change in her activity level.  Again, the episodes are very  different from what she had in December.   CURRENT MEDICATIONS:  1. Metoprolol  25 b.i.d.  2. Prozac 20 daily.  3. Aspirin 325 daily.  4. Multivitamin.  5. Vitamin E.  6. Calcium daily.  7. Plavix 75 daily.  8. Lipitor 80 daily.   PHYSICAL EXAMINATION:  GENERAL:  The patient is in no distress.  VITAL SIGNS:  Blood pressure 138/78, pulse 57, weight 165.  NECK:  JVP is normal, no bruits.  LUNGS:  Clear to auscultation.  CARDIAC:  Regular rate and rhythm, S1, S2.  No S2, no murmurs.  EXTREMITIES:  No edema.   IMPRESSION:  1. Coronary artery disease, clinically doing well.  I am not convinced      these episodes this week are anything cardiac.  Again, she is      having a lot of work done that is very odiferous.  Would follow.      If it worsens, she should call.  2. With the bruising I said she could go down on  aspirin to 81 mg      daily, keep on the Plavix.  I will call her back and tell her she      can stop vitamin E.  3. Dyslipidemia.  Excellent lipid panel.  Triglycerides just a tad      high at 170.  We talked about this.  LDL of 69, HDL of 46, total      cholesterol 149.   I will set followup for July, sooner if problems develop.     Pricilla Riffle, MD, [facility]  Electronically Signed    PVR/MedQ  DD: 10/16/2006  DT: 10/16/2006  Job #: [id number]   cc:   Valetta Mole. Swords, MD

## 2011-01-03 NOTE — Assessment & Plan Note (Signed)
[facility]                            CARDIOLOGY OFFICE NOTE   Preeti, Wine[place]ner CORDIA MIKLOS                       MRN:          [id number]  DATE:10/16/2006                            DOB:          [birth year]    IDENTIFICATION:  Ms. Bianca [name] is a 65 year old woman, status post recent MI  with Taxus stent x3.  She was last seen in cardiology back in January.   In the interval, she has done okay.  She has noted occasional dull  sensation in her chest, lasts about a minute.  Different from what she  had had with her event in December.  Otherwise active, notes no change  in this.  Has noted increased bruising.   CURRENT MEDICATIONS:  1. Metoprolol 25 b.i.d.  2. Prozac 20 daily.  3. Aspirin 325 daily.  4. Calcium, vitamin E, multivitamin.  5. Plavix 75 daily.  6. Lipitor 80 daily.   PHYSICAL EXAMINATION:  The patient is in no distress.  Blood pressure  138/78, pulse is 57 and regular, weight 165.  LUNGS:  Clear.  CARDIAC:  Regular rate and rhythm, S1 and S2, no S3.  ABDOMEN:  Benign.  EXTREMITIES:  No edema.   IMPRESSION:  1. Coronary artery disease, status post MI December, had a 25% LAD,      80% mid distal LAD.  RCA felt to be the culprit lesion, 100%      occlusion before mid stent.  Circumflex 25% and 50% OM.  Currently having some symptoms, but I would watch.  I am not convinced  ischemia.  Would continue current regimen.  Told her to stop vitamin E.  1. Dyslipidemia, on 80 Lipitor.  Will need to check followup.  2. Tobacco use, quit.  3. Bruising.  Cut back down on aspirin to 81, continue on Plavix.   I will set followup for the summer, sooner if problems develop.     Pricilla Riffle, MD, [facility]  Electronically Signed   PVR/MedQ  DD: 10/16/2006  DT: 10/16/2006  Job #: [id number]   cc:   Valetta Mole. Swords, MD

## 2011-01-03 NOTE — Discharge Summary (Signed)
NAMECARINA, CHAPLIN                ACCOUNT NO.:  [id number]   MEDICAL RECORD NO.:  [id number]          PATIENT TYPE:  INP   LOCATION:  2923                         FACILITY:  [facility]   PHYSICIAN:  Raenette Rover. Felicity Coyer, MDDATE OF BIRTH:  [birth year]   DATE OF ADMISSION:  08/01/2006  DATE OF DISCHARGE:  08/04/2006                               DISCHARGE SUMMARY   DISCHARGE DIAGNOSIS:  Coronary artery disease, non-ST elevation  myocardial infarction, status post stenting x3 to the right coronary  artery August 03, 2006 per Veverly Fells. Excell Seltzer, M.D.   HISTORY OF PRESENT ILLNESS:  Mrs. Barkett is a 65 year old white female  who was admitted on August 01, 2006 with chief complaint of chest  pain. She had a known history of coronary artery disease having had a  myocardial infarction in 1997 with stenting.  She also has a history of  tobacco abuse, hypertension and hyperlipidemia. Due to multiple risk  factors a cardiology consult was obtained.   PAST MEDICAL HISTORY:  1. MI status post stent 1997.  2. Hypertension.  3. Hyperlipidemia.  4. History of spinal fusion age 32 of the lumbar spine.  5. Recent lumbar disk surgery per Dr. Jeral Fruit.   HOSPITAL COURSE:  Problem 1.  Non-ST elevation MI.  The patient was  admitted.  She was noted to have mild elevation of her troponin with  highest value of 0.10.  She was taken to cardiac cath lab where she  underwent cath with stenting of the right coronary artery with three  Taxus stents and it was recommended the patient remain on lifelong dual  antiplatelet therapy with aspirin and clopidogrel.   Problem 2.  Tobacco abuse.  The patient will be provided with a  prescription for Chantix.  She is motivated to quit smoking.  She is not  sure if she wishes to use Chantix or to quit cold [place]   Problem 3.  Dyslipidemia. The patient was maintained on her Vytorin.  This will be increased from 10/40 once daily to 10/80 once daily at time  of  discharge.   DISCHARGE MEDICATIONS:  1. Metoprolol 25 mg p.o. b.i.d.  2. Vytorin 10/80 1 tablet p.o. q.h.s.  3. Prozac 20 mg p.o. daily.  4. Plavix 75 mg p.o. daily.  5. Aspirin 325 mg p.o. daily.  6. Chantix 0.5 mg tablets 1 tablet p.o. daily for 3 days, 1 tablet      p.o. b.i.d. for 4 days, and 2 tablets p.o. b.i.d. for 11 weeks.  7. Ambien 10 mg p.o. q.h.s.   DISPOSITION:  The patient was discharged home.  She is instructed to  follow up with Dr. Birdie Sons on January 8 at 11:45 a.m.  She is also  instructed to follow up with Dr. Huston Foley and contact the office for an  appointment.      Sandford Craze, NP      Raenette Rover. Felicity Coyer, MD  Electronically Signed    MO/MEDQ  D:  08/04/2006  T:  08/04/2006  Job:  [id number]   cc:   Pricilla Riffle, MD, [facility]  Bruce Rexene Edison Swords, MD

## 2011-01-14 ENCOUNTER — Other Ambulatory Visit: Payer: Self-pay | Admitting: *Deleted

## 2011-01-14 MED ORDER — EZETIMIBE-SIMVASTATIN 10-80 MG PO TABS: 1.0000 | ORAL_TABLET | Freq: Every day | ORAL | Status: DC

## 2011-02-09 ENCOUNTER — Other Ambulatory Visit: Payer: Self-pay | Admitting: Internal Medicine

## 2011-04-30 ENCOUNTER — Telehealth: Payer: Self-pay | Admitting: Internal Medicine

## 2011-04-30 NOTE — Telephone Encounter (Signed)
Pt is scheduled for a med follow up on 06/05/11 but is going to be out of zolpidem (AMBIEN) 10 MG tablet. Pt has request a refill to last till her med follow up.

## 2011-05-01 MED ORDER — ZOLPIDEM TARTRATE 10 MG PO TABS: 10.0000 mg | ORAL_TABLET | Freq: Every evening | ORAL | Status: DC | PRN

## 2011-05-01 NOTE — Telephone Encounter (Signed)
rx called in, pt aware 

## 2011-05-09 ENCOUNTER — Other Ambulatory Visit: Payer: Self-pay | Admitting: *Deleted

## 2011-05-09 MED ORDER — ESOMEPRAZOLE MAGNESIUM 40 MG PO CPDR: 40.0000 mg | DELAYED_RELEASE_CAPSULE | Freq: Every day | ORAL | Status: DC

## 2011-05-09 NOTE — Telephone Encounter (Signed)
Called pt and advised her that she needs to be taking her Nexium every day due to hx of strictures. If she was taking it everyday she would have been out in April 2012. She states that she understands and will make sure to take it everyday. rx sent

## 2011-05-10 ENCOUNTER — Other Ambulatory Visit: Payer: Self-pay | Admitting: Internal Medicine

## 2011-05-14 LAB — CBC
HCT: 41.7
Hemoglobin: 12.8
Hemoglobin: 14.5
MCHC: 34.8
MCHC: 35.1
MCV: 92.5
RBC: 3.93
RBC: 4.48
RDW: 12
RDW: 12.1

## 2011-05-14 LAB — CLOSTRIDIUM DIFFICILE EIA

## 2011-05-14 LAB — BASIC METABOLIC PANEL
CO2: 23
CO2: 25
Chloride: 112
Creatinine, Ser: 0.67
GFR calc Af Amer: >60
GFR calc Af Amer: >60
GFR calc non Af Amer: >60
Glucose, Bld: 102 — ABNORMAL HIGH
Glucose, Bld: 95
Potassium: 4
Sodium: 140

## 2011-05-14 LAB — URINALYSIS, ROUTINE W REFLEX MICROSCOPIC
Bilirubin Urine: NEGATIVE
Glucose, UA: NEGATIVE
Hgb urine dipstick: NEGATIVE
Protein, ur: NEGATIVE

## 2011-05-14 LAB — DIFFERENTIAL
Basophils Absolute: 0
Eosinophils Relative: 0
Lymphocytes Relative: 24
Monocytes Absolute: 0.5
Monocytes Relative: 8

## 2011-05-14 LAB — HEPATIC FUNCTION PANEL
ALT: 23
AST: 31
Alkaline Phosphatase: 72
Bilirubin, Direct: 0.1
Indirect Bilirubin: 1 — ABNORMAL HIGH
Total Bilirubin: 1.1

## 2011-06-05 ENCOUNTER — Encounter: Payer: Self-pay | Admitting: Internal Medicine

## 2011-06-05 ENCOUNTER — Ambulatory Visit (INDEPENDENT_AMBULATORY_CARE_PROVIDER_SITE_OTHER): Payer: Managed Care, Other (non HMO) | Admitting: Internal Medicine

## 2011-06-05 VITALS — BP 126/82 | HR 88 | Temp 98.2°F | Ht 63.0 in | Wt 170.0 lb

## 2011-06-05 DIAGNOSIS — I10 Essential (primary) hypertension: Secondary | ICD-10-CM

## 2011-06-05 DIAGNOSIS — K219 Gastro-esophageal reflux disease without esophagitis: Secondary | ICD-10-CM

## 2011-06-05 DIAGNOSIS — Z23 Encounter for immunization: Secondary | ICD-10-CM

## 2011-06-05 DIAGNOSIS — E785 Hyperlipidemia, unspecified: Secondary | ICD-10-CM

## 2011-06-05 DIAGNOSIS — F17200 Nicotine dependence, unspecified, uncomplicated: Secondary | ICD-10-CM

## 2011-06-05 LAB — HEPATIC FUNCTION PANEL
Albumin: 4.1 g/dL (ref 3.5–5.2)
Total Protein: 7.1 g/dL (ref 6.0–8.3)

## 2011-06-05 LAB — LIPID PANEL
Cholesterol: 147 mg/dL (ref 0–200)
HDL: 45.6 mg/dL (ref >39.00)
Total CHOL/HDL Ratio: 3
VLDL: 50 mg/dL — ABNORMAL HIGH (ref 0.0–40.0)

## 2011-06-05 LAB — BASIC METABOLIC PANEL
Chloride: 107 mEq/L (ref 96–112)
Potassium: 4.8 mEq/L (ref 3.5–5.1)

## 2011-06-05 LAB — LDL CHOLESTEROL, DIRECT: Direct LDL: 72.9 mg/dL

## 2011-06-05 LAB — TSH: TSH: 1.77 u[IU]/mL (ref 0.35–5.50)

## 2011-06-05 MED ORDER — CLOBETASOL PROPIONATE 0.05 % EX SOLN: CUTANEOUS | Status: DC

## 2011-06-05 MED ORDER — CLOPIDOGREL BISULFATE 75 MG PO TABS: 75.0000 mg | ORAL_TABLET | Freq: Every day | ORAL | Status: DC

## 2011-06-05 MED ORDER — ZOLPIDEM TARTRATE 10 MG PO TABS: 10.0000 mg | ORAL_TABLET | Freq: Every evening | ORAL | Status: DC | PRN

## 2011-06-05 MED ORDER — NITROGLYCERIN 0.4 MG SL SUBL: 0.4000 mg | SUBLINGUAL_TABLET | SUBLINGUAL | Status: DC | PRN

## 2011-06-05 MED ORDER — METOPROLOL TARTRATE 50 MG PO TABS: 25.0000 mg | ORAL_TABLET | Freq: Two times a day (BID) | ORAL | Status: DC

## 2011-06-05 MED ORDER — FLUOXETINE HCL 40 MG PO CAPS: 40.0000 mg | ORAL_CAPSULE | Freq: Every day | ORAL | Status: DC

## 2011-06-05 NOTE — Assessment & Plan Note (Signed)
Needs to quit.  

## 2011-06-05 NOTE — Assessment & Plan Note (Signed)
BP Readings from Last 3 Encounters:  06/05/11 126/82  11/06/10 110/70  06/05/10 120/72   Well controlled, continue meds

## 2011-06-05 NOTE — Assessment & Plan Note (Signed)
Well controlled Continue meds 

## 2011-06-05 NOTE — Assessment & Plan Note (Signed)
Needs f/u Check labs today 

## 2011-06-08 NOTE — Progress Notes (Signed)
  Subjective:    Patient ID: Bianca [name], female    DOB: 02/24/1946, 65 y.o.   MRN: [id number]  HPI  Patient comes in for followup. His multiple medical problems including coronary artery disease status post myocardial infarction. She has a history of hypertension. She has history of gastroesophageal reflux disease. She complains of chronic back pain. She continues to use tobacco. She is tolerating her medications without difficulty.  Past Medical History  Diagnosis Date  . CAD (coronary artery disease)   . Depression   . Hyperlipidemia   . Hypertension   . Heart attack   . Chronic back pain    Past Surgical History  Procedure Date  . Cesarean section   . Back surgery   . Angioplasty   . Ankle surgery   . Spinal fusion     reports that she has been smoking.  She does not have any smokeless tobacco history on file. She reports that she does not drink alcohol or use illicit drugs. family history includes Cancer in her paternal grandfather; Coronary artery disease in her mother; Dementia in her father; Diabetes in her mother; Hypertension in her mother; and Sudden death in her brother. Allergies  Allergen Reactions  . Oxycodone Hcl     REACTION: rash/confusion  . Penicillins     REACTION: unspecified     Review of Systems    patient denies chest pain, shortness of breath, orthopnea. Denies lower extremity edema, abdominal pain, change in appetite, change in bowel movements. Patient denies rashes, musculoskeletal complaints. No other specific complaints in a complete review of systems.    Objective:   Physical Exam   Well-developed well-nourished female in no acute distress. HEENT exam atraumatic, normocephalic, extraocular muscles are intact. Neck is supple. No jugular venous distention no thyromegaly. Chest clear to auscultation without increased work of breathing. Cardiac exam S1 and S2 are regular. Abdominal exam active bowel sounds, soft, nontender. Extremities no edema.  Neurologic exam she is alert without any motor sensory deficits. Gait is normal.      Assessment & Plan:

## 2011-06-10 ENCOUNTER — Telehealth: Payer: Self-pay | Admitting: Internal Medicine

## 2011-06-11 ENCOUNTER — Telehealth: Payer: Self-pay | Admitting: *Deleted

## 2011-06-11 MED ORDER — ZOLPIDEM TARTRATE 10 MG PO TABS: 10.0000 mg | ORAL_TABLET | Freq: Every evening | ORAL | Status: DC | PRN

## 2011-06-11 NOTE — Telephone Encounter (Signed)
Dr Cato Mulligan sent in Latexo for a year to pharmacy, I changed to 6 months b/c of ambien being controlled.  Ok per Dr Cato Mulligan.  Updated med list

## 2011-06-17 NOTE — Telephone Encounter (Signed)
Open in error

## 2011-10-22 ENCOUNTER — Telehealth: Payer: Self-pay | Admitting: Internal Medicine

## 2011-10-22 MED ORDER — ZOLPIDEM TARTRATE 10 MG PO TABS: 10.0000 mg | ORAL_TABLET | Freq: Every evening | ORAL | Status: DC | PRN

## 2011-10-22 NOTE — Telephone Encounter (Signed)
Pt req refill of zolpidem (AMBIEN) 10 MG tablet to [facility]).

## 2011-10-22 NOTE — Telephone Encounter (Signed)
rx sent in electronically 

## 2011-12-22 ENCOUNTER — Other Ambulatory Visit: Payer: Self-pay | Admitting: *Deleted

## 2011-12-22 MED ORDER — METOPROLOL TARTRATE 50 MG PO TABS: 25.0000 mg | ORAL_TABLET | Freq: Two times a day (BID) | ORAL | Status: DC

## 2011-12-22 MED ORDER — NITROGLYCERIN 0.4 MG SL SUBL: 0.4000 mg | SUBLINGUAL_TABLET | SUBLINGUAL | Status: DC | PRN

## 2012-01-19 ENCOUNTER — Other Ambulatory Visit: Payer: Self-pay | Admitting: *Deleted

## 2012-01-19 MED ORDER — ESOMEPRAZOLE MAGNESIUM 40 MG PO CPDR: 40.0000 mg | DELAYED_RELEASE_CAPSULE | Freq: Every day | ORAL | Status: DC

## 2012-02-20 ENCOUNTER — Telehealth: Payer: Self-pay | Admitting: Family Medicine

## 2012-02-20 MED ORDER — ZOLPIDEM TARTRATE 10 MG PO TABS: 10.0000 mg | ORAL_TABLET | Freq: Every evening | ORAL | Status: DC | PRN

## 2012-02-20 NOTE — Telephone Encounter (Signed)
Pt forgot to check with [facility] on her zolpidem 10mg . She is now out, and has been out. Called [facility], and they cannot get it to her for 5 days. Requesting we call it in to [facility] on [address], just enough for 5-10 days. Please call pt if an issue.

## 2012-02-20 NOTE — Telephone Encounter (Signed)
1 wk supply called in, pt aware

## 2012-04-01 ENCOUNTER — Telehealth: Payer: Self-pay | Admitting: Internal Medicine

## 2012-04-01 ENCOUNTER — Encounter: Payer: Self-pay | Admitting: Family Medicine

## 2012-04-01 ENCOUNTER — Ambulatory Visit (INDEPENDENT_AMBULATORY_CARE_PROVIDER_SITE_OTHER): Payer: Managed Care, Other (non HMO) | Admitting: Family Medicine

## 2012-04-01 VITALS — BP 110/70 | Temp 98.0°F | Wt 170.0 lb

## 2012-04-01 DIAGNOSIS — F17200 Nicotine dependence, unspecified, uncomplicated: Secondary | ICD-10-CM

## 2012-04-01 DIAGNOSIS — J301 Allergic rhinitis due to pollen: Secondary | ICD-10-CM

## 2012-04-01 MED ORDER — FLUTICASONE PROPIONATE 50 MCG/ACT NA SUSP: NASAL | Status: DC

## 2012-04-01 MED ORDER — VARENICLINE TARTRATE 1 MG PO TABS: ORAL_TABLET | ORAL | Status: DC

## 2012-04-01 NOTE — Progress Notes (Signed)
  Subjective:    Patient ID: Bianca [name], female    DOB: [birth year], 66 y.o.   MRN: [id number]  HPI Bianca [name] is a 66 year old female smoker,,,,,,,,, who says she has decreased to 2 cigarettes a day from a pack a day,,,,,,,, who has underlying coronary disease who comes in today stating she has a sinus infection  For the past 5 days she's had head congestion. No fever earache sore throat cough etc. etc. She has a history of allergic rhinitis in the spring and fall.   Review of Systems General and ENT review of systems otherwise negative    Objective:   Physical Exam Well-developed and nourished female smells of tobacco  HEENT were negative except for marked bilateral nasal edema       Assessment & Plan:  Allergic rhinitis/tobacco abuse plan DC smoking completely and steroid nasal spray when necessary

## 2012-04-01 NOTE — Patient Instructions (Addendum)
Stop smoking completely  Claritin 10 mg plain one daily  Steroid nasal spray one shot up each nostril twice daily  Chantix one half tab daily in the morning

## 2012-04-01 NOTE — Telephone Encounter (Signed)
Caller: Carolan/Patient; Patient Name: Bianca [name]; PCP: Birdie Sons; Best Callback Phone Number: [phone]. Caller reports a week of cough and cold sxs. Now has facial pain and pressure with  headache. States, "I know it's a sinus infection. I get them all the time." Afebrile. Per Facial Pain or Swelling Protocol, Facial pain, frontal headache, yellow-green nasal discharge, See in 24 hrs. Appt scheduled for today, Thurs 8/15 at 12:30 with Dr Joellyn Haff. Caller is agreeable.

## 2012-04-20 ENCOUNTER — Telehealth: Payer: Self-pay | Admitting: Family

## 2012-04-20 ENCOUNTER — Encounter: Payer: Self-pay | Admitting: Family

## 2012-04-20 ENCOUNTER — Ambulatory Visit (INDEPENDENT_AMBULATORY_CARE_PROVIDER_SITE_OTHER): Payer: Managed Care, Other (non HMO) | Admitting: Family

## 2012-04-20 VITALS — BP 122/80 | Temp 98.2°F | Wt 167.0 lb

## 2012-04-20 DIAGNOSIS — R8290 Unspecified abnormal findings in urine: Secondary | ICD-10-CM

## 2012-04-20 DIAGNOSIS — R82998 Other abnormal findings in urine: Secondary | ICD-10-CM

## 2012-04-20 DIAGNOSIS — J3489 Other specified disorders of nose and nasal sinuses: Secondary | ICD-10-CM

## 2012-04-20 DIAGNOSIS — J019 Acute sinusitis, unspecified: Secondary | ICD-10-CM

## 2012-04-20 DIAGNOSIS — Z720 Tobacco use: Secondary | ICD-10-CM

## 2012-04-20 DIAGNOSIS — F17200 Nicotine dependence, unspecified, uncomplicated: Secondary | ICD-10-CM

## 2012-04-20 LAB — POCT URINALYSIS DIPSTICK
Bilirubin, UA: NEGATIVE
Glucose, UA: NEGATIVE
Leukocytes, UA: NEGATIVE
Nitrite, UA: NEGATIVE

## 2012-04-20 MED ORDER — AZITHROMYCIN 250 MG PO TABS: ORAL_TABLET | ORAL | Status: AC

## 2012-04-20 NOTE — Telephone Encounter (Signed)
Please advise patient her urine is normal

## 2012-04-20 NOTE — Progress Notes (Signed)
Subjective:    Patient ID: Bianca [name], female    DOB: 1946-05-16, 66 y.o.   MRN: [id number]  HPI 66 year old white female, patient of Dr. Tawanna Cooler, smoker, is in with complaints of flulike symptoms x3 weeks. She was in to see Dr. Tawanna Cooler on 04/01/2012 and was diagnosed with a viral illness. Since, she's had fever and chills reports a temperature of 99.5 -100.5. Complains of weakness, sinus pressure, and drainage. Cough is nonproductive. Denies any sneezing, congestion, muscle aches or pain. Symptoms are overall worse. She has been taken Claritin and Mucinex with little to no relief.   Review of Systems  Constitutional: Positive for fever and fatigue.  HENT: Positive for congestion, sore throat, sneezing, postnasal drip and sinus pressure.   Eyes: Negative.   Respiratory: Negative.   Cardiovascular: Negative.   Skin: Negative.   Neurological: Negative.   Hematological: Negative.   Psychiatric/Behavioral: Negative.    Past Medical History  Diagnosis Date  . CAD (coronary artery disease)   . Depression   . Hyperlipidemia   . Hypertension   . Heart attack   . Chronic back pain     History   Social History  . Marital Status: Married    Spouse Name: N/A    Number of Children: N/A  . Years of Education: N/A   Occupational History  . Not on file.   Social History Main Topics  . Smoking status: Current Everyday Smoker    Types: Cigarettes  . Smokeless tobacco: Not on file  . Alcohol Use: No  . Drug Use: No  . Sexually Active: Not on file   Other Topics Concern  . Not on file   Social History Narrative  . No narrative on file    Past Surgical History  Procedure Date  . Cesarean section   . Back surgery   . Angioplasty   . Ankle surgery   . Spinal fusion     Family History  Problem Relation Age of Onset  . Coronary artery disease Mother   . Diabetes Mother   . Hypertension Mother   . Dementia Father   . Sudden death Brother     suicide  . Cancer Paternal  Grandfather     esophageal    Allergies  Allergen Reactions  . Oxycodone Hcl     REACTION: rash/confusion  . Penicillins     REACTION: unspecified    Current Outpatient Prescriptions on File Prior to Visit  Medication Sig Dispense Refill  . clobetasol (TEMOVATE) 0.05 % external solution Apply topically as directed.  50 mL  11  . clopidogrel (PLAVIX) 75 MG tablet Take 1 tablet (75 mg total) by mouth daily.  90 tablet  3  . esomeprazole (NEXIUM) 40 MG capsule Take 1 capsule (40 mg total) by mouth daily before breakfast.  30 capsule  3  . ezetimibe-simvastatin (VYTORIN) 10-80 MG per tablet Take 1 tablet by mouth at bedtime.  30 tablet  5  . FLUoxetine (PROZAC) 40 MG capsule Take 1 capsule (40 mg total) by mouth daily.  90 capsule  3  . fluticasone (FLONASE) 50 MCG/ACT nasal spray 1 shot up each nostril twice daily  16 g  6  . metoprolol (LOPRESSOR) 50 MG tablet Take 0.5 tablets (25 mg total) by mouth 2 (two) times daily.  180 tablet  1  . nitroGLYCERIN (NITROSTAT) 0.4 MG SL tablet Place 1 tablet (0.4 mg total) under the tongue every 5 (five) minutes as needed.  90 tablet  3  . Probiotic Product (PHILLIPS COLON HEALTH) CAPS Take by mouth.        . traMADol (ULTRAM) 50 MG tablet Take 1 tablet by mouth Once daily as needed.      . varenicline (CHANTIX) 1 MG tablet One half tab daily in the morning  30 tablet  2  . zolpidem (AMBIEN) 10 MG tablet Take 1 tablet (10 mg total) by mouth at bedtime as needed.  7 tablet  0    BP 122/80  Temp 98.2 F (36.8 C) (Oral)  Wt 167 lb (75.751 kg)chart    Objective:   Physical Exam  Constitutional: She is oriented to person, place, and time. She appears well-developed and well-nourished.  HENT:  Right Ear: External ear normal.  Left Ear: External ear normal.  Nose: Nose normal.  Mouth/Throat: Oropharynx is clear and moist.       Sinus pressure to palpation  Eyes: Conjunctivae are normal. Pupils are equal, round, and reactive to light.  Neck:  Normal range of motion. Neck supple.  Cardiovascular: Normal rate, regular rhythm and normal heart sounds.   Pulmonary/Chest: Effort normal and breath sounds normal.  Neurological: She is alert and oriented to person, place, and time.  Skin: Skin is warm and dry.  Psychiatric: She has a normal mood and affect.          Assessment & Plan:  Assessment: Acute sinusitis-worsening, tobacco abuse  Plan: Z-Pak as directed. Continue Claritin over-the-counter. Patient call the office if symptoms worsen or persist. Recheck a schedule, and when necessary.

## 2012-04-20 NOTE — Patient Instructions (Signed)

## 2012-04-21 NOTE — Telephone Encounter (Signed)
Pt aware.

## 2012-05-17 ENCOUNTER — Emergency Department (HOSPITAL_COMMUNITY): Payer: Managed Care, Other (non HMO)

## 2012-05-17 ENCOUNTER — Inpatient Hospital Stay (HOSPITAL_COMMUNITY)
Admission: EM | Admit: 2012-05-17 | Discharge: 2012-05-19 | DRG: 247 | Disposition: A | Discharge status: Home / Self Care | Payer: Managed Care, Other (non HMO) | Attending: Internal Medicine | Admitting: Internal Medicine

## 2012-05-17 DIAGNOSIS — M549 Dorsalgia, unspecified: Secondary | ICD-10-CM | POA: Diagnosis present

## 2012-05-17 DIAGNOSIS — Z9861 Coronary angioplasty status: Secondary | ICD-10-CM

## 2012-05-17 DIAGNOSIS — Z885 Allergy status to narcotic agent status: Secondary | ICD-10-CM

## 2012-05-17 DIAGNOSIS — F329 Major depressive disorder, single episode, unspecified: Secondary | ICD-10-CM | POA: Diagnosis present

## 2012-05-17 DIAGNOSIS — E785 Hyperlipidemia, unspecified: Secondary | ICD-10-CM | POA: Diagnosis present

## 2012-05-17 DIAGNOSIS — G8929 Other chronic pain: Secondary | ICD-10-CM | POA: Diagnosis present

## 2012-05-17 DIAGNOSIS — I214 Non-ST elevation (NSTEMI) myocardial infarction: Principal | ICD-10-CM | POA: Diagnosis present

## 2012-05-17 DIAGNOSIS — I25110 Atherosclerotic heart disease of native coronary artery with unstable angina pectoris: Secondary | ICD-10-CM | POA: Diagnosis present

## 2012-05-17 DIAGNOSIS — F17200 Nicotine dependence, unspecified, uncomplicated: Secondary | ICD-10-CM | POA: Diagnosis present

## 2012-05-17 DIAGNOSIS — I2510 Atherosclerotic heart disease of native coronary artery without angina pectoris: Secondary | ICD-10-CM | POA: Diagnosis present

## 2012-05-17 DIAGNOSIS — R5381 Other malaise: Secondary | ICD-10-CM | POA: Diagnosis present

## 2012-05-17 DIAGNOSIS — Z955 Presence of coronary angioplasty implant and graft: Secondary | ICD-10-CM

## 2012-05-17 DIAGNOSIS — F3289 Other specified depressive episodes: Secondary | ICD-10-CM | POA: Diagnosis present

## 2012-05-17 DIAGNOSIS — R079 Chest pain, unspecified: Secondary | ICD-10-CM

## 2012-05-17 DIAGNOSIS — K219 Gastro-esophageal reflux disease without esophagitis: Secondary | ICD-10-CM | POA: Diagnosis present

## 2012-05-17 DIAGNOSIS — I252 Old myocardial infarction: Secondary | ICD-10-CM

## 2012-05-17 DIAGNOSIS — Z880 Allergy status to penicillin: Secondary | ICD-10-CM

## 2012-05-17 DIAGNOSIS — Y831 Surgical operation with implant of artificial internal device as the cause of abnormal reaction of the patient, or of later complication, without mention of misadventure at the time of the procedure: Secondary | ICD-10-CM | POA: Diagnosis present

## 2012-05-17 DIAGNOSIS — T82598A Other mechanical complication of other cardiac and vascular devices and implants, initial encounter: Secondary | ICD-10-CM | POA: Diagnosis present

## 2012-05-17 DIAGNOSIS — R5383 Other fatigue: Secondary | ICD-10-CM | POA: Diagnosis present

## 2012-05-17 DIAGNOSIS — I10 Essential (primary) hypertension: Secondary | ICD-10-CM | POA: Diagnosis present

## 2012-05-17 DIAGNOSIS — Z79899 Other long term (current) drug therapy: Secondary | ICD-10-CM

## 2012-05-17 HISTORY — DX: Anesthesia of skin: R20.0

## 2012-05-17 HISTORY — DX: Unspecified osteoarthritis, unspecified site: M19.90

## 2012-05-17 HISTORY — DX: Gastro-esophageal reflux disease without esophagitis: K21.9

## 2012-05-17 HISTORY — DX: Low back pain: M54.5

## 2012-05-17 HISTORY — DX: Carpal tunnel syndrome, bilateral upper limbs: G56.03

## 2012-05-17 HISTORY — DX: Low back pain, unspecified: M54.50

## 2012-05-17 HISTORY — DX: Other chronic pain: G89.29

## 2012-05-17 LAB — COMPREHENSIVE METABOLIC PANEL
ALT: 11 U/L (ref 0–35)
AST: 19 U/L (ref 0–37)
Albumin: 3.6 g/dL (ref 3.5–5.2)
Alkaline Phosphatase: 75 U/L (ref 39–117)
BUN: 9 mg/dL (ref 6–23)
CO2: 23 mEq/L (ref 19–32)
Calcium: 9.5 mg/dL (ref 8.4–10.5)
Chloride: 104 mEq/L (ref 96–112)
Creatinine, Ser: 0.61 mg/dL (ref 0.50–1.10)
GFR calc Af Amer: >90 mL/min (ref >90)
GFR calc non Af Amer: >90 mL/min (ref >90)
Glucose, Bld: 101 mg/dL — ABNORMAL HIGH (ref 70–99)
Potassium: 4.2 mEq/L (ref 3.5–5.1)
Sodium: 137 mEq/L (ref 135–145)
Total Bilirubin: 0.3 mg/dL (ref 0.3–1.2)
Total Protein: 6.6 g/dL (ref 6.0–8.3)

## 2012-05-17 LAB — CBC
HCT: 36.7 % (ref 36.0–46.0)
HCT: 36.6 % (ref 36.0–46.0)
Hemoglobin: 12.6 g/dL (ref 12.0–15.0)
Hemoglobin: 12.5 g/dL (ref 12.0–15.0)
MCH: 30.9 pg (ref 26.0–34.0)
MCH: 31 pg (ref 26.0–34.0)
MCHC: 34.3 g/dL (ref 30.0–36.0)
MCHC: 34.2 g/dL (ref 30.0–36.0)
MCV: 90 fL (ref 78.0–100.0)
MCV: 90.8 fL (ref 78.0–100.0)
Platelets: 243 10*3/uL (ref 150–400)
RBC: 4.08 MIL/uL (ref 3.87–5.11)
RBC: 4.03 MIL/uL (ref 3.87–5.11)
RDW: 12.2 % (ref 11.5–15.5)
WBC: 6.6 10*3/uL (ref 4.0–10.5)

## 2012-05-17 LAB — APTT: aPTT: 35 seconds (ref 24–37)

## 2012-05-17 LAB — POCT I-STAT TROPONIN I: Troponin i, poc: 0.07 ng/mL (ref 0.00–0.08)

## 2012-05-17 LAB — CREATININE, SERUM: Creatinine, Ser: 0.77 mg/dL (ref 0.50–1.10)

## 2012-05-17 MED ORDER — ACETAMINOPHEN 325 MG PO TABS
650.0000 mg | ORAL_TABLET | ORAL | Status: DC | PRN
  Administered 2012-05-17: 650 mg via ORAL

## 2012-05-17 MED ORDER — SODIUM CHLORIDE 0.9 % IV SOLN
1000.0000 mL | INTRAVENOUS | Status: DC
  Administered 2012-05-17: 1000 mL via INTRAVENOUS

## 2012-05-17 MED ORDER — NITROGLYCERIN 0.4 MG SL SUBL: 0.4000 mg | SUBLINGUAL_TABLET | SUBLINGUAL | Status: DC | PRN

## 2012-05-17 MED ORDER — NITROGLYCERIN 2 % TD OINT
1.0000 [in_us] | TOPICAL_OINTMENT | Freq: Once | TRANSDERMAL | Status: AC
  Administered 2012-05-17: 1 [in_us] via TOPICAL
  Filled 2012-05-17 (×2): qty 1

## 2012-05-17 MED ORDER — SODIUM CHLORIDE 0.9 % IJ SOLN: 3.0000 mL | INTRAMUSCULAR | Status: DC | PRN

## 2012-05-17 MED ORDER — NITROGLYCERIN 0.4 MG SL SUBL
0.4000 mg | SUBLINGUAL_TABLET | SUBLINGUAL | Status: DC | PRN
  Administered 2012-05-17 (×3): 0.4 mg via SUBLINGUAL
  Filled 2012-05-17: qty 25

## 2012-05-17 MED ORDER — ZOLPIDEM TARTRATE 5 MG PO TABS: 10.0000 mg | ORAL_TABLET | Freq: Every evening | ORAL | Status: DC | PRN

## 2012-05-17 MED ORDER — ASPIRIN EC 81 MG PO TBEC
81.0000 mg | DELAYED_RELEASE_TABLET | Freq: Every day | ORAL | Status: DC
  Filled 2012-05-17: qty 1

## 2012-05-17 MED ORDER — HEPARIN BOLUS VIA INFUSION
4000.0000 [IU] | Freq: Once | INTRAVENOUS | Status: AC
  Administered 2012-05-17: 4000 [IU] via INTRAVENOUS
  Filled 2012-05-17: qty 4000

## 2012-05-17 MED ORDER — HEPARIN (PORCINE) IN NACL 100-0.45 UNIT/ML-% IJ SOLN
850.0000 [IU]/h | INTRAMUSCULAR | Status: DC
  Administered 2012-05-17: 850 [IU]/h via INTRAVENOUS
  Filled 2012-05-17: qty 250

## 2012-05-17 MED ORDER — HEPARIN SODIUM (PORCINE) 5000 UNIT/ML IJ SOLN
5000.0000 [IU] | Freq: Three times a day (TID) | INTRAMUSCULAR | Status: DC
  Filled 2012-05-17 (×2): qty 1

## 2012-05-17 MED ORDER — ZOLPIDEM TARTRATE 5 MG PO TABS
5.0000 mg | ORAL_TABLET | Freq: Every evening | ORAL | Status: DC | PRN
  Administered 2012-05-18: 5 mg via ORAL
  Filled 2012-05-17: qty 1

## 2012-05-17 MED ORDER — EZETIMIBE-SIMVASTATIN 10-80 MG PO TABS
1.0000 | ORAL_TABLET | Freq: Every day | ORAL | Status: DC
  Administered 2012-05-17 – 2012-05-18 (×2): 1 via ORAL
  Filled 2012-05-17 (×3): qty 1

## 2012-05-17 MED ORDER — SODIUM CHLORIDE 0.9 % IV SOLN
1.0000 mL/kg/h | INTRAVENOUS | Status: DC
  Administered 2012-05-18: 1 mL/kg/h via INTRAVENOUS

## 2012-05-17 MED ORDER — ASPIRIN 300 MG RE SUPP: 300.0000 mg | RECTAL | Status: DC

## 2012-05-17 MED ORDER — PANTOPRAZOLE SODIUM 40 MG PO PACK
40.0000 mg | PACK | Freq: Every day | ORAL | Status: DC
  Administered 2012-05-18: 40 mg via ORAL
  Filled 2012-05-17 (×5): qty 20

## 2012-05-17 MED ORDER — METOPROLOL TARTRATE 25 MG PO TABS
25.0000 mg | ORAL_TABLET | Freq: Two times a day (BID) | ORAL | Status: DC
  Administered 2012-05-17 – 2012-05-18 (×2): 25 mg via ORAL
  Filled 2012-05-17 (×6): qty 1

## 2012-05-17 MED ORDER — SODIUM CHLORIDE 0.9 % IJ SOLN
3.0000 mL | Freq: Two times a day (BID) | INTRAMUSCULAR | Status: DC
  Administered 2012-05-17: 3 mL via INTRAVENOUS

## 2012-05-17 MED ORDER — ASPIRIN 81 MG PO CHEW
324.0000 mg | CHEWABLE_TABLET | ORAL | Status: AC
  Administered 2012-05-18: 324 mg via ORAL
  Filled 2012-05-17: qty 4

## 2012-05-17 MED ORDER — ASPIRIN 81 MG PO CHEW: 324.0000 mg | CHEWABLE_TABLET | ORAL | Status: DC

## 2012-05-17 MED ORDER — SODIUM CHLORIDE 0.9 % IV SOLN: 250.0000 mL | INTRAVENOUS | Status: DC | PRN

## 2012-05-17 MED ORDER — ONDANSETRON HCL 4 MG/2ML IJ SOLN
4.0000 mg | Freq: Four times a day (QID) | INTRAMUSCULAR | Status: DC | PRN
  Administered 2012-05-18: 4 mg via INTRAVENOUS

## 2012-05-17 NOTE — ED Provider Notes (Signed)
History     CSN: [id number] Arrival date & time 05/17/12  1206 First MD Initiated Contact with Patient 05/17/12 1208     Cardiologist: Tenny Craw Chief Complaint  Patient presents with  . Chest Pain   Patient is a 66 y.o. female presenting with chest pain. The history is provided by the patient.  Chest Pain The chest pain began 3 - 5 hours ago. Episode frequency: waxing and waning. The chest pain is resolved. The quality of the pain is described as burning (located in center of chest). Radiates to: jaw. Primary symptoms include nausea and vomiting. Pertinent negatives for primary symptoms include no fever, no shortness of breath and no cough. She tried NSAIDs, proton pump inhibitors and nitroglycerin for the symptoms.  Her past medical history is significant for CAD and MI.   Pt has history of GERD and CAD.  Her symptoms are similar to both of those.  Last MI in December in 2007.  4 stents.  NO follow up cath.  Past Medical History  Diagnosis Date  . CAD (coronary artery disease)   . Depression   . Hyperlipidemia   . Hypertension   . Heart attack   . Chronic back pain     Past Surgical History  Procedure Date  . Cesarean section   . Back surgery   . Angioplasty   . Ankle surgery   . Spinal fusion     Family History  Problem Relation Age of Onset  . Coronary artery disease Mother   . Diabetes Mother   . Hypertension Mother   . Dementia Father   . Sudden death Brother     suicide  . Cancer Paternal Grandfather     esophageal    History  Substance Use Topics  . Smoking status: Current Every Day Smoker    Types: Cigarettes  . Smokeless tobacco: Not on file  . Alcohol Use: No    OB History    Grav Para Term Preterm Abortions TAB SAB Ect Mult Living                  Review of Systems  Constitutional: Negative for fever.  Respiratory: Negative for cough and shortness of breath.   Cardiovascular: Positive for chest pain.  Gastrointestinal: Positive for nausea and  vomiting.  All other systems reviewed and are negative.    Allergies  Oxycodone hcl and Penicillins  Home Medications   Current Outpatient Rx  Name Route Sig Dispense Refill  . CLOBETASOL PROPIONATE 0.05 % EX SOLN Topical Apply topically as directed. 50 mL 11  . CLOPIDOGREL BISULFATE 75 MG PO TABS Oral Take 1 tablet (75 mg total) by mouth daily. 90 tablet 3  . ESOMEPRAZOLE MAGNESIUM 40 MG PO CPDR Oral Take 1 capsule (40 mg total) by mouth daily before breakfast. 30 capsule 3  . EZETIMIBE-SIMVASTATIN 10-80 MG PO TABS Oral Take 1 tablet by mouth at bedtime. 30 tablet 5  . FLUOXETINE HCL 40 MG PO CAPS Oral Take 1 capsule (40 mg total) by mouth daily. 90 capsule 3  . FLUTICASONE PROPIONATE 50 MCG/ACT NA SUSP  1 shot up each nostril twice daily 16 g 6  . METOPROLOL TARTRATE 50 MG PO TABS Oral Take 0.5 tablets (25 mg total) by mouth 2 (two) times daily. 180 tablet 1  . NITROGLYCERIN 0.4 MG SL SUBL Sublingual Place 1 tablet (0.4 mg total) under the tongue every 5 (five) minutes as needed. 90 tablet 3  . PHILLIPS COLON HEALTH PO  CAPS Oral Take by mouth.      . TRAMADOL HCL 50 MG PO TABS Oral Take 1 tablet by mouth Once daily as needed.    Marland Kitchen VARENICLINE TARTRATE 1 MG PO TABS  One half tab daily in the morning 30 tablet 2  . ZOLPIDEM TARTRATE 10 MG PO TABS Oral Take 1 tablet (10 mg total) by mouth at bedtime as needed. 7 tablet 0    BP 120/71  Pulse 61  Temp 98.7 F (37.1 C) (Oral)  Resp 14  Ht 5\' 3"  (1.6 m)  Wt 164 lb (74.39 kg)  BMI 29.05 kg/m2  SpO2 97%  Physical Exam  Nursing note and vitals reviewed. Constitutional: She appears well-developed and well-nourished. No distress.  HENT:  Head: Normocephalic and atraumatic.  Right Ear: External ear normal.  Left Ear: External ear normal.  Eyes: Conjunctivae normal are normal. Right eye exhibits no discharge. Left eye exhibits no discharge. No scleral icterus.  Neck: Neck supple. No tracheal deviation present.  Cardiovascular: Normal  rate, regular rhythm and intact distal pulses.   Pulmonary/Chest: Effort normal and breath sounds normal. No stridor. No respiratory distress. She has no wheezes. She has no rales.  Abdominal: Soft. Bowel sounds are normal. She exhibits no distension. There is no tenderness. There is no rebound and no guarding.  Musculoskeletal: She exhibits no edema and no tenderness.  Neurological: She is alert. She has normal strength. No sensory deficit. Cranial nerve deficit:  no gross defecits noted. She exhibits normal muscle tone. She displays no seizure activity. Coordination normal.  Skin: Skin is warm and dry. No rash noted.  Psychiatric: She has a normal mood and affect.    ED Course  Procedures (including critical care time)  Rate: 60  Rhythm: normal sinus rhythm  QRS Axis: normal  Intervals: normal  ST/T Wave abnormalities: non specific t wave changes  Conduction Disutrbances:none  Old EKG Reviewed: none available  Labs Reviewed  COMPREHENSIVE METABOLIC PANEL - Abnormal; Notable for the following:    Glucose, Bld 101 (*)     All other components within normal limits  CBC  PROTIME-INR  APTT  POCT I-STAT TROPONIN I   Dg Chest Portable 1 View  05/17/2012  *RADIOLOGY REPORT*  Clinical Data: Chest pain  PORTABLE CHEST - 1 VIEW  Comparison: 03/15/2009  Findings: The cardiomediastinal silhouette is unremarkable. The lungs are clear. There is no evidence of focal airspace disease, pulmonary edema, suspicious pulmonary nodule/mass, pleural effusion, or pneumothorax. No acute bony abnormalities are identified.  IMPRESSION: No evidence of active cardiopulmonary disease.   Original Report Authenticated By: Rosendo Gros, M.D.       MDM  The patient has known cardiac history in presents with recurrent chest pain that was burning in nature radiating to her jaw. Patient states the symptoms are somewhat similar to her previous MI pain that she has had.  SHe has had GERD symptoms as well but has  difficulty distinguishing between the two.  Currently, she is pain-free. Initial laboratory evaluation is unremarkable. I will consult with her cardiologist regarding further evaluation.       Celene Kras, MD 05/17/12 [id number]

## 2012-05-17 NOTE — ED Notes (Signed)
Patient was brought in via EMS with Chest Pain described as mid-sternal with radiation to jaw, patient has nausea without emesis. Patient took one NTG SL at home with minor relief. EMS administered a second NTG SL with relief of Jaw pain but still has chest pain. She also received 3 baby ASA.

## 2012-05-17 NOTE — Progress Notes (Signed)
ANTICOAGULATION CONSULT NOTE - Initial Consult  Pharmacy Consult for heparin Indication: chest pain/ACS  Allergies  Allergen Reactions  . Oxycodone Hcl     REACTION: rash/confusion  . Penicillins     REACTION: unspecified    Patient Measurements: Height: 5\' 3"  (160 cm) Weight: 169 lb 1.6 oz (76.703 kg) IBW/kg (Calculated) : 52.4  Heparin Dosing Weight: 69 kg  Vital Signs: Temp: 98.2 F (36.8 C) (09/30 1955) Temp src: Oral (09/30 1955) BP: 143/65 mmHg (09/30 1955) Pulse Rate: 62  (09/30 1955)  Labs:  [facility] 05/17/12 2135 05/17/12 1256  HGB 12.5 12.6  HCT 36.6 36.7  PLT 257 243  APTT -- 35  LABPROT -- 12.5  INR -- 0.94  HEPARINUNFRC -- --  CREATININE 0.77 0.61  CKTOTAL -- --  CKMB -- --  TROPONINI -- --    Estimated Creatinine Clearance: 67.8 ml/min (by C-G formula based on Cr of 0.77).   Medical History: Past Medical History  Diagnosis Date  . CAD (coronary artery disease)   . Depression   . Hyperlipidemia   . Hypertension   . Heart attack   . Chronic back pain     Medications:  Scheduled:    . aspirin  324 mg Oral NOW   Or  . aspirin  300 mg Rectal NOW  . aspirin  324 mg Oral Pre-Cath  . aspirin EC  81 mg Oral Daily  . ezetimibe-simvastatin  1 tablet Oral QHS  . metoprolol  25 mg Oral BID  . nitroGLYCERIN  1 inch Topical Once  . pantoprazole sodium  40 mg Oral Daily  . sodium chloride  3 mL Intravenous Q12H  . DISCONTD: heparin  5,000 Units Subcutaneous Q8H    Assessment: 66 yo female admitted with chest pain and elevated troponin. Pharmacy consulted to manage IV heparin. Plan for cath.   Goal of Therapy:  Heparin level 0.3-0.7 units/ml Monitor platelets by anticoagulation protocol: Yes   Plan:  1. Heparin IV bolus of 4000 units x 1. 2. Heparin IV infusion of 850 units/hr.  3. Heparin level in 6 hours. 4. Daily CBC, heparin level.   Emeline Gins 05/17/2012,11:03 PM

## 2012-05-17 NOTE — ED Notes (Signed)
Patient states she is not having any chest pain. Spoke with CDU and patient is going to CDU6.

## 2012-05-17 NOTE — ED Notes (Signed)
Patient states she was watching her grandson and developed burning in her chest with radiating to her jaw. She was nauseated and short of breath. Patient states this is the same pain she had with her previous MI.

## 2012-05-17 NOTE — Progress Notes (Addendum)
Pt started complaining of burning in center of chest radiating up to neck as well as pressure in chest. Pt given a total of 3 SL Nitro. After first Nitro, discomfort decreased in severity from a 5 to a 2 on a scale from 0-10. She had another episode of burning and pressure rating it a 6. Discomfort subsided after second Nitro. Then she had more burning and pressure rating it a 5-6. Pt received third Nitro. Burning and pressure is subsiding, and pt states she feels better. Pt on 4 L O2 with O2 sat 96-98%. BP 126/73. HR 57. 12 lead EKG done when pt first experienced burning. Notified rapid response RN of pt's condition. Paged MD on call. Orders received for cardiac panel and Nitro gtt. Will continue to monitor.  Alfonso Ellis, RN

## 2012-05-17 NOTE — H&P (Signed)
Patient ID: Bianca [name] MRN: [id number], DOB/AGE: 1946-01-06   Admit date: 05/17/2012 Date of Consult: @TODAY @  Primary Physician: Judie Petit, MD Primary Cardiologist: Tenny Craw    Problem List: Past Medical History  Diagnosis Date  . CAD (coronary artery disease)   . Depression   . Hyperlipidemia   . Hypertension   . Heart attack   . Chronic back pain     Past Surgical History  Procedure Date  . Cesarean section   . Back surgery   . Angioplasty   . Ankle surgery   . Spinal fusion      Allergies:  Allergies  Allergen Reactions  . Oxycodone Hcl     REACTION: rash/confusion  . Penicillins     REACTION: unspecified    HPI:  Bianca [name] is a 66 year old with a history of CAD.  S/p MI in 1997 with PTCA to RCA.  Repeat MI in 2007.  Cath showed:  LAD 25%prox; 80% mid/distal; D1 with moderate CAD, D2 with 25% lesion; LCx with 25% AV groove lesion; 50% OM1; RCA was 100% occluded proximally.  Distal R filled by L to R collateralls.  LVEF 55%.  Patient underwent PTCA/DES (taxus) x3 to RCA with recomm for lifelong ASA and Plavix Patient admitted for CP in 2010.  Cath showed no change in anatomy (no report).  I saw the patient in clinic in 2009 (cant't access 2010-2011 clinic)  Earlier today the patient develped CP--center of chest to jaw.  Accomp by N/V.  No SOB.  Tried PPI, NSAID, NTG without relief.  Pain waxed and waned. This pain is different from her reflux pain which is high in neck, not down through chest.  She does admit to a bitter taste in mouth today, though she has vomited. She also notes increased fatigue over the past month or 2  She has been treated for a sinus infection fatigue did not get better when sinus treated. Does note occasional chest pains over past several months.  Vague on details.  Patient does note that she stopped taking plavix awhile ago. Patient says she quit smoking.  Inpatient Medications:    . nitroGLYCERIN  1 inch Topical Once    Family  History  Problem Relation Age of Onset  . Coronary artery disease Mother   . Diabetes Mother   . Hypertension Mother   . Dementia Father   . Sudden death Brother     suicide  . Cancer Paternal Grandfather     esophageal     History   Social History  . Marital Status: Married    Spouse Name: N/A    Number of Children: N/A  . Years of Education: N/A   Occupational History  . Not on file.   Social History Main Topics  . Smoking status: Current Every Day Smoker    Types: Cigarettes  . Smokeless tobacco: Not on file  . Alcohol Use: No  . Drug Use: No  . Sexually Active: Not on file   Other Topics Concern  . Not on file   Social History Narrative  . No narrative on file     Review of Systems: All other systems reviewed and are otherwise negative except as noted above.  Physical Exam: Filed Vitals:   05/17/12 1500  BP: 131/67  Pulse: 60  Temp:   Resp: 16   No intake or output data in the 24 hours ending 05/17/12 1704  General: Well developed, well nourished, in no acute distress.  Head: Normocephalic, atraumatic, sclera non-icteric Neck: Negative for carotid bruits. JVP not elevated. Lungs: Clear bilaterally to auscultation without wheezes, rales, or rhonchi. Breathing is unlabored. Heart: RRR with S1 S2. No murmurs, rubs, or gallops appreciated. Abdomen: Soft, non-tender, non-distended with normoactive bowel sounds. No hepatomegaly. No rebound/guarding. No obvious abdominal masses. Msk:  Strength and tone appears normal for age. Extremities: No clubbing, cyanosis or edema.  Distal pedal pulses are 2+ and equal bilaterally. Neuro: Alert and oriented X 3. Moves all extremities spontaneously. Psych:  Responds to questions appropriately with a normal affect.  Labs: Results for orders placed during the hospital encounter of 05/17/12 (from the past 24 hour(s))  CBC     Status: Normal   Collection Time   05/17/12 12:56 PM      Component Value Range   WBC 6.6  4.0  - 10.5 K/uL   RBC 4.08  3.87 - 5.11 MIL/uL   Hemoglobin 12.6  12.0 - 15.0 g/dL   HCT 16.1  09.6 - 04.5 %   MCV 90.0  78.0 - 100.0 fL   MCH 30.9  26.0 - 34.0 pg   MCHC 34.3  30.0 - 36.0 g/dL   RDW 40.9  81.1 - 91.4 %   Platelets 243  150 - 400 K/uL  COMPREHENSIVE METABOLIC PANEL     Status: Abnormal   Collection Time   05/17/12 12:56 PM      Component Value Range   Sodium 137  135 - 145 mEq/L   Potassium 4.2  3.5 - 5.1 mEq/L   Chloride 104  96 - 112 mEq/L   CO2 23  19 - 32 mEq/L   Glucose, Bld 101 (*) 70 - 99 mg/dL   BUN 9  6 - 23 mg/dL   Creatinine, Ser 7.82  0.50 - 1.10 mg/dL   Calcium 9.5  8.4 - 95.6 mg/dL   Total Protein 6.6  6.0 - 8.3 g/dL   Albumin 3.6  3.5 - 5.2 g/dL   AST 19  0 - 37 U/L   ALT 11  0 - 35 U/L   Alkaline Phosphatase 75  39 - 117 U/L   Total Bilirubin 0.3  0.3 - 1.2 mg/dL   GFR calc non Af Amer >90  >90 mL/min   GFR calc Af Amer >90  >90 mL/min  PROTIME-INR     Status: Normal   Collection Time   05/17/12 12:56 PM      Component Value Range   Prothrombin Time 12.5  11.6 - 15.2 seconds   INR 0.94  0.00 - 1.49  APTT     Status: Normal   Collection Time   05/17/12 12:56 PM      Component Value Range   aPTT 35  24 - 37 seconds  POCT I-STAT TROPONIN I     Status: Normal   Collection Time   05/17/12  1:10 PM      Component Value Range   Troponin i, poc 0.02  0.00 - 0.08 ng/mL   Comment 3           POCT I-STAT TROPONIN I     Status: Normal   Collection Time   05/17/12  3:49 PM      Component Value Range   Troponin i, poc 0.07  0.00 - 0.08 ng/mL   Comment 3             Radiology/Studies: Dg Chest Portable 1 View  05/17/2012  *RADIOLOGY REPORT*  Clinical Data:  Chest pain  PORTABLE CHEST - 1 VIEW  Comparison: 03/15/2009  Findings: The cardiomediastinal silhouette is unremarkable. The lungs are clear. There is no evidence of focal airspace disease, pulmonary edema, suspicious pulmonary nodule/mass, pleural effusion, or pneumothorax. No acute bony  abnormalities are identified.  IMPRESSION: No evidence of active cardiopulmonary disease.   Original Report Authenticated By: Rosendo Gros, M.D.     EKG:  SR  60 bpm.  ASSESSMENT AND PLAN:   Patient is a 66 year old with a history of CP  Presents today with CP on and off today.  Not like usual reflux.  Does also note increased fatigue recently.  EKG neg but  Was negative with MI in past.  Plan to admit, r/o for MI  Currently pain free.  If recurs or rules in will start heparin, Will hold on IIbIIIa inhibitor until after cath given multivessel dz in past.   2.  HL  Will check lipids in am.  Continue meds  3. GERD  Continue PPI  4.  Fatigue  Will check TSH  5.  Tob  Congratulated on quitting Tob. SignedDietrich Pates 05/17/2012, 5:04 PM

## 2012-05-17 NOTE — ED Notes (Signed)
Report called to RN.  Pt. Will be going to room 27.

## 2012-05-18 ENCOUNTER — Encounter (HOSPITAL_COMMUNITY)
Admission: EM | Disposition: A | Discharge status: Home / Self Care | Payer: Self-pay | Source: Home / Self Care | Attending: Internal Medicine

## 2012-05-18 ENCOUNTER — Encounter (HOSPITAL_COMMUNITY): Payer: Self-pay | Admitting: General Practice

## 2012-05-18 DIAGNOSIS — I2510 Atherosclerotic heart disease of native coronary artery without angina pectoris: Secondary | ICD-10-CM

## 2012-05-18 DIAGNOSIS — I214 Non-ST elevation (NSTEMI) myocardial infarction: Secondary | ICD-10-CM

## 2012-05-18 DIAGNOSIS — I252 Old myocardial infarction: Secondary | ICD-10-CM | POA: Diagnosis present

## 2012-05-18 HISTORY — PX: LEFT HEART CATHETERIZATION WITH CORONARY ANGIOGRAM: SHX5451

## 2012-05-18 LAB — CK TOTAL AND CKMB (NOT AT ARMC)
CK, MB: 6 ng/mL — ABNORMAL HIGH (ref 0.3–4.0)
CK, MB: 5.3 ng/mL — ABNORMAL HIGH (ref 0.3–4.0)
Total CK: 101 U/L (ref 7–177)
Total CK: 97 U/L (ref 7–177)

## 2012-05-18 LAB — CBC
HCT: 36.7 % (ref 36.0–46.0)
Hemoglobin: 12.6 g/dL (ref 12.0–15.0)
MCV: 89.5 fL (ref 78.0–100.0)
RBC: 4.1 MIL/uL (ref 3.87–5.11)
WBC: 7.2 10*3/uL (ref 4.0–10.5)

## 2012-05-18 LAB — BASIC METABOLIC PANEL
CO2: 23 mEq/L (ref 19–32)
Chloride: 105 mEq/L (ref 96–112)
Creatinine, Ser: 0.68 mg/dL (ref 0.50–1.10)
GFR calc Af Amer: >90 mL/min (ref >90)
Potassium: 4.1 mEq/L (ref 3.5–5.1)

## 2012-05-18 LAB — TROPONIN I: Troponin I: 0.53 ng/mL (ref <0.30)

## 2012-05-18 LAB — LIPID PANEL
HDL: 44 mg/dL (ref >39)
LDL Cholesterol: 72 mg/dL (ref 0–99)
Triglycerides: 220 mg/dL — ABNORMAL HIGH (ref <150)

## 2012-05-18 LAB — TSH: TSH: 1.946 u[IU]/mL (ref 0.350–4.500)

## 2012-05-18 LAB — HEPARIN LEVEL (UNFRACTIONATED): Heparin Unfractionated: 0.26 IU/mL — ABNORMAL LOW (ref 0.30–0.70)

## 2012-05-18 LAB — POCT ACTIVATED CLOTTING TIME: Activated Clotting Time: 604 seconds

## 2012-05-18 SURGERY — LEFT HEART CATHETERIZATION WITH CORONARY ANGIOGRAM
Anesthesia: LOCAL

## 2012-05-18 MED ORDER — NITROGLYCERIN 0.2 MG/ML ON CALL CATH LAB
INTRAVENOUS | Status: AC
  Filled 2012-05-18: qty 1

## 2012-05-18 MED ORDER — MIDAZOLAM HCL 2 MG/2ML IJ SOLN
INTRAMUSCULAR | Status: AC
  Filled 2012-05-18: qty 2

## 2012-05-18 MED ORDER — FAMOTIDINE IN NACL 20-0.9 MG/50ML-% IV SOLN
INTRAVENOUS | Status: AC
  Filled 2012-05-18: qty 50

## 2012-05-18 MED ORDER — SODIUM CHLORIDE 0.9 % IV SOLN
INTRAVENOUS | Status: AC
  Administered 2012-05-18: 11:00:00 via INTRAVENOUS

## 2012-05-18 MED ORDER — VERAPAMIL HCL 2.5 MG/ML IV SOLN
INTRAVENOUS | Status: AC
  Filled 2012-05-18: qty 2

## 2012-05-18 MED ORDER — ASPIRIN EC 325 MG PO TBEC
325.0000 mg | DELAYED_RELEASE_TABLET | Freq: Every day | ORAL | Status: DC
  Administered 2012-05-18: 14:00:00 325 mg via ORAL
  Filled 2012-05-18 (×2): qty 1

## 2012-05-18 MED ORDER — NITROGLYCERIN 0.4 MG/SPRAY TL SOLN
1.0000 | Freq: Once | Status: AC
  Administered 2012-05-18: 1 via SUBLINGUAL
  Filled 2012-05-18: qty 4.9

## 2012-05-18 MED ORDER — LIDOCAINE HCL (PF) 1 % IJ SOLN
INTRAMUSCULAR | Status: AC
  Filled 2012-05-18: qty 30

## 2012-05-18 MED ORDER — NITROGLYCERIN IN D5W 200-5 MCG/ML-% IV SOLN
10.0000 ug/min | INTRAVENOUS | Status: DC
  Administered 2012-05-18: 10 ug/min via INTRAVENOUS
  Filled 2012-05-18: qty 250

## 2012-05-18 MED ORDER — TRAMADOL HCL 50 MG PO TABS
50.0000 mg | ORAL_TABLET | Freq: Four times a day (QID) | ORAL | Status: DC | PRN
  Administered 2012-05-18: 21:00:00 50 mg via ORAL
  Filled 2012-05-18: qty 1

## 2012-05-18 MED ORDER — CLOPIDOGREL BISULFATE 300 MG PO TABS
ORAL_TABLET | ORAL | Status: AC
  Filled 2012-05-18: qty 2

## 2012-05-18 MED ORDER — ONDANSETRON HCL 4 MG/2ML IJ SOLN
INTRAMUSCULAR | Status: AC
  Filled 2012-05-18: qty 2

## 2012-05-18 MED ORDER — CLOPIDOGREL BISULFATE 75 MG PO TABS
75.0000 mg | ORAL_TABLET | Freq: Every day | ORAL | Status: DC
  Filled 2012-05-18: qty 1

## 2012-05-18 MED ORDER — NITROGLYCERIN 0.4 MG SL SUBL: 0.4000 mg | SUBLINGUAL_TABLET | SUBLINGUAL | Status: DC | PRN

## 2012-05-18 MED ORDER — HEPARIN (PORCINE) IN NACL 2-0.9 UNIT/ML-% IJ SOLN
INTRAMUSCULAR | Status: AC
  Filled 2012-05-18: qty 1000

## 2012-05-18 MED ORDER — FENTANYL CITRATE 0.05 MG/ML IJ SOLN
INTRAMUSCULAR | Status: AC
  Filled 2012-05-18: qty 2

## 2012-05-18 MED ORDER — ALUM & MAG HYDROXIDE-SIMETH 200-200-20 MG/5ML PO SUSP
30.0000 mL | ORAL | Status: DC | PRN
  Administered 2012-05-18: 12:00:00 30 mL via ORAL
  Filled 2012-05-18: qty 30

## 2012-05-18 MED ORDER — HEPARIN SODIUM (PORCINE) 1000 UNIT/ML IJ SOLN
INTRAMUSCULAR | Status: AC
  Filled 2012-05-18: qty 1

## 2012-05-18 MED ORDER — BIVALIRUDIN 250 MG IV SOLR
INTRAVENOUS | Status: AC
  Filled 2012-05-18: qty 250

## 2012-05-18 NOTE — Progress Notes (Signed)
Utilization Review Completed.  

## 2012-05-18 NOTE — Progress Notes (Signed)
Called at 2348 for patient having recurrent chest pain despite 3 doses of NTG SL. VSS, Burning chest pain ranges from 2 to 5-10 pain. EKG obtained, MD notified, orders received for cardiac enzymes and nitro gtt. Will continue to monitor, advised bedside RN to call with further needs

## 2012-05-18 NOTE — Progress Notes (Signed)
SUBJECTIVE: Cardiac catheterization showed severe one-vessel coronary artery disease in the right coronary artery with in-stent restenosis as well as moderate stenosis in the LAD/left circumflex. She underwent balloon angioplasty of the right PDA as well as drug-eluting stent placement to the distal RCA. She had chest discomfort at the end of the procedure in spite of maintaining TIMI-3 flow without obvious complications. Her chest pain is currently minimal.   Filed Vitals:   05/18/12 0527 05/18/12 0722 05/18/12 1008 05/18/12 1211  BP: 107/59  142/69 129/68  Pulse: 53 53 49 56  Temp: 98.1 F (36.7 C)  98.1 F (36.7 C) 98.1 F (36.7 C)  TempSrc: Oral  Oral Oral  Resp: 16  17 20   Height:      Weight:      SpO2: 98%  100% 100%    Intake/Output Summary (Last 24 hours) at 05/18/12 1331 Last data filed at 05/18/12 1100  Gross per 24 hour  Intake     60 ml  Output    400 ml  Net   -340 ml    LABS: Basic Metabolic Panel:  Basename 05/18/12 0600 05/17/12 2135 05/17/12 1256  NA 138 -- 137  K 4.1 -- 4.2  CL 105 -- 104  CO2 23 -- 23  GLUCOSE 110* -- 101*  BUN 10 -- 9  CREATININE 0.68 0.77 --  CALCIUM 9.5 -- 9.5  MG -- -- --  PHOS -- -- --   Liver Function Tests:  Basename 05/17/12 1256  AST 19  ALT 11  ALKPHOS 75  BILITOT 0.3  PROT 6.6  ALBUMIN 3.6   No results found for this basename: LIPASE:2,AMYLASE:2 in the last 72 hours CBC:  Basename 05/18/12 0600 05/17/12 2135  WBC 7.2 6.6  NEUTROABS -- --  HGB 12.6 12.5  HCT 36.7 36.6  MCV 89.5 90.8  PLT 231 257   Cardiac Enzymes:  Basename 05/18/12 0600 05/18/12 0033  CKTOTAL 97 101  CKMB 5.3* 6.0*  CKMBINDEX -- --  TROPONINI 0.53* 0.76*   BNP: No components found with this basename: POCBNP:3 D-Dimer: No results found for this basename: DDIMER:2 in the last 72 hours Hemoglobin A1C: No results found for this basename: HGBA1C in the last 72 hours Fasting Lipid Panel:  Basename 05/18/12 0600  CHOL 160    HDL 44  LDLCALC 72  TRIG 220*  CHOLHDL 3.6  LDLDIRECT --   Thyroid Function Tests:  Basename 05/17/12 2135  TSH 1.946  T4TOTAL --  T3FREE --  THYROIDAB --   Anemia Panel: No results found for this basename: VITAMINB12,FOLATE,FERRITIN,TIBC,IRON,RETICCTPCT in the last 72 hours   PHYSICAL EXAM General: Well developed, well nourished, in no acute distress HEENT:  Normocephalic and atramatic Neck:  No JVD.  Lungs: Clear bilaterally to auscultation and percussion. Heart: HRRR . Normal S1 and S2 without gallops or murmurs.  Abdomen: Bowel sounds are positive, abdomen soft and non-tender  Msk:  Back normal, normal gait. Normal strength and tone for age. Extremities: No clubbing, cyanosis or edema.   Neuro: Alert and oriented X 3. Psych:  Good affect, responds appropriately Right radial pulse is normal. No hematoma. TELEMETRY: Reviewed telemetry pt in normal sinus rhythm.:  ASSESSMENT AND PLAN: 1. Non-ST elevation myocardial infarction. Status post PCI of the RCA. Continue dual antiplatelet therapy for at least one year. If she develops further restenosis in the RCA, the best treatment option would probably be CABG given that she has moderate lesions in the LAD and left circumflex. 2.  GERD: Continue treatment with Protonix. 3. Tobacco use: I advised her strongly to quit smoking. Possible discharge home tomorrow if chest pain-free.  Lorine Bears, MD, [facility] 05/18/2012 1:31 PM

## 2012-05-18 NOTE — Progress Notes (Signed)
TR BAND REMOVAL  LOCATION:  right radial  DEFLATED PER PROTOCOL:  yes  TIME BAND OFF / DRESSING APPLIED:   1415   SITE UPON ARRIVAL:   Level 0  SITE AFTER BAND REMOVAL:  Level 0  REVERSE ALLEN'S TEST:    positive  CIRCULATION SENSATION AND MOVEMENT:  Within Normal Limits  yes  COMMENTS:    

## 2012-05-18 NOTE — Progress Notes (Signed)
CRITICAL VALUE ALERT  Critical value received: Troponin 0.76  Date of notification: 05-18-12  Time of notification: 0213  Critical value read back: yes  Nurse who received alert: Brayton Caves, RN  MD notified (1st page): Dr. Shirlee Latch (on call)  Time of first page: 0220  MD notified (2nd page): Dr. Shirlee Latch  Time of second page: 0253  Responding MD: Dr. Shirlee Latch  Time MD responded: 0300

## 2012-05-18 NOTE — Progress Notes (Signed)
Notified MD on call, Dr. Shirlee Latch, that pt's troponin was 0.18 at 1908. Order received to start Heparin gtt.  Alfonso Ellis, RN

## 2012-05-18 NOTE — Interval H&P Note (Signed)
History and Physical Interval Note:  05/18/2012 7:31 AM  Bianca [name]  has presented today for surgery, with the diagnosis of Chest pain  The various methods of treatment have been discussed with the patient and family. After consideration of risks, benefits and other options for treatment, the patient has consented to  Procedure(s) (LRB) with comments: LEFT HEART CATHETERIZATION WITH CORONARY ANGIOGRAM (N/A) as a surgical intervention .  The patient's history has been reviewed, patient examined, no change in status, stable for surgery.  I have reviewed the patient's chart and labs.  Questions were answered to the patient's satisfaction.     Lorine Bears

## 2012-05-18 NOTE — CV Procedure (Signed)
Cardiac Catheterization Procedure Note  Name: Bianca [name] MRN: [id number] DOB: 1945/10/28  Procedure: Left Heart Cath, Selective Coronary Angiography, LV angiography, PTCA of the right PDA with an Angio sculpt  Balloon,  PTCA and stenting of the distal right coronary artery with drug-eluting stent placement.   Indication: Non-ST elevation myocardial infarction with known history of coronary artery disease multiple previous stenting of the right coronary artery.   Medications:  Sedation:  2  mg IV Versed, 150  mcg IV Fentanyl  Contrast:  125 mL  Omnipaque  Procedural Details: The right wrist was prepped, draped, and anesthetized with 1% lidocaine. Using the modified Seldinger technique, a 5 French sheath was introduced into the right radial artery. 3 mg of verapamil was administered through the sheath, weight-based unfractionated heparin was administered intravenously. A  Jackie  catheter was used for selective coronary angiography And left ventricular angiography .  Catheter exchanges were performed over an exchange length guidewire. There were no immediate procedural complications.  Procedural Findings:  Hemodynamics: AO:  107/57   mmHg LV:  105/11    mmHg LVEDP: 19   mmHg  Coronary angiography: Coronary dominance: Right    Left Main:  Normal in size and free of significant disease.   Left Anterior Descending (LAD):  Normal in size with mild calcifications and atherosclerosis. There is a 60-70% lesion in the mid LAD which appears to be stable from previous cardiac catheterization. The rest of the LAD has minor irregularities.   1st diagonal (D1):  Large in size with mild 20% stenosis at the ostium.   2nd diagonal (D2):  Very small in size.   3rd diagonal (D3):  Very small in size.   Circumflex (LCx):  Normal in size and nondominant. There is a 50% lesion in the midsegment. The rest of the vessel has minor irregularities.   1st obtuse marginal:  Small in size and free of  significant disease.   2nd obtuse marginal:  Very small in size.  3rd obtuse marginal:  Normal in size with minor irregularities.   The AV groove continuation is free of significant disease and gives one posterior lateral branch.    Right Coronary Artery: Normal in size and dominant. Multiple overlapped stents are noted from the proximal to the distal segment. In the midsegment there is a 40-50% in-stent restenosis. Distally, there is a 95% stenosis and a 60% stenosis more proximal to that.   posterior descending artery: Normal in size with 80% disease in the midsegment. The vessel diameter is 2 mm in that location.   posterior lateral branch:  Normal in size with minor irregularities.    Left ventriculography: Left ventricular systolic function is normal , LVEF is estimated at 60 %.   PCI Note:  Following the diagnostic procedure, the decision was made to proceed with PCI. The radial sheath was upsized to a 6 [place]. Weight-based bivalirudin was given for anticoagulation. Once a therapeutic ACT was achieved, a 6 [place] JR 4  guide catheter was inserted.  A intuition  coronary guidewire was used to cross the lesion.  The lesion  in the right PDA was predilated with a 2.0 x 10 mm angioscope  balloonTo 8 atmospheres for 1 minute.  before that, I predilated the lesion in the distal RCA with the same balloon to 10 atmospheres. There was good results with only 10% residual stenosis.  The lesion in the distal RCA  was then stented with a 2.75 x 33 mm Xience expedition  stent.  The stent was postdilated with a 3.0  noncompliant balloon.  Following PCI, there was 0% residual stenosis and TIMI-3 flow. Final angiography confirmed an excellent result. The patient tolerated the procedure well. There were no immediate procedural complications. A TR band was used for radial hemostasis. The patient was transferred to the post catheterization recovery area for further monitoring.  PCI Data: Vessel - right PDA was  /Segment  Percent Stenosis (pre)  80%  TIMI-flow 3  Stent balloon angioplasty only  Percent Stenosis (post) 10%  TIMI-flow (post) 3  Vessel - distal RCA/Segment - 3 Percent Stenosis (pre)  95% TIMI-flow 3 Stent 2.75 x 33 mm Xience expedition postdilated with a 3.0 noncompliant balloon Percent Stenosis (post) 0% TIMI-flow (post) 3   Final Conclusions:  1. Severe one-vessel coronary artery disease with in-stent restenosis in the distal RCA as well as de novo lesion in the right PDA. Moderate LAD and left circumflex disease.  2. Normal LV systolic function. 3. Successful angioplasty without stent placement to the right PDA as well as angioplasty and drug-eluting stent placement to the distal right coronary artery.   Recommendations:  I recommend lifelong dual antiplatelet therapy if possible. I suspect that the patient will ultimately require CABG if she continues to have restenosis in the RCA especially that there is borderline significant disease in the LAD and moderate left circumflex stenosis.   Lorine Bears MD, [facility] 05/18/2012, 8:46 AM

## 2012-05-19 ENCOUNTER — Encounter (HOSPITAL_COMMUNITY): Payer: Self-pay | Admitting: Nurse Practitioner

## 2012-05-19 LAB — CBC
HCT: 36.9 % (ref 36.0–46.0)
Hemoglobin: 12.5 g/dL (ref 12.0–15.0)
MCH: 30.9 pg (ref 26.0–34.0)
MCV: 91.3 fL (ref 78.0–100.0)
RBC: 4.04 MIL/uL (ref 3.87–5.11)

## 2012-05-19 LAB — BASIC METABOLIC PANEL
BUN: 9 mg/dL (ref 6–23)
CO2: 27 mEq/L (ref 19–32)
Calcium: 9.6 mg/dL (ref 8.4–10.5)
Glucose, Bld: 106 mg/dL — ABNORMAL HIGH (ref 70–99)
Sodium: 140 mEq/L (ref 135–145)

## 2012-05-19 MED ORDER — CLOPIDOGREL BISULFATE 75 MG PO TABS: 75.0000 mg | ORAL_TABLET | Freq: Every day | ORAL | Status: DC

## 2012-05-19 MED ORDER — NITROGLYCERIN 0.4 MG SL SUBL: 0.4000 mg | SUBLINGUAL_TABLET | SUBLINGUAL | Status: DC | PRN

## 2012-05-19 MED ORDER — ASPIRIN 81 MG PO TBEC: 81.0000 mg | DELAYED_RELEASE_TABLET | Freq: Every day | ORAL | Status: DC

## 2012-05-19 MED ORDER — PANTOPRAZOLE SODIUM 40 MG PO TBEC: 40.0000 mg | DELAYED_RELEASE_TABLET | Freq: Every day | ORAL | Status: DC

## 2012-05-19 MED FILL — Dextrose Inj 5%: INTRAVENOUS | Qty: 50 | Status: AC

## 2012-05-19 NOTE — Progress Notes (Signed)
CARDIAC REHAB PHASE I   PRE:  Rate/Rhythm: 54 SB  BP:  Supine: 119/56  Sitting:   Standing:    SaO2: 98 RA  MODE:  Ambulation: 1000 ft   POST:  Rate/Rhythem: 78 SR  BP:  Supine:   Sitting: 129/66  Standing:    SaO2: 0750-0900 Pt tolerated ambulation well without c/o of cp or SOB. VS stable.P limps on right leg, states that she needs RTHR. Competed MI education with pt .Discussed smoking cessation with pt. Gave her tips for quitting and contact numbers for coaching lines. She is committed to quitting cold [place].Pt agrees to Outpt. CRP in [place], will send referral.  Bianca [name]

## 2012-05-19 NOTE — Progress Notes (Signed)
Patient ID: Townsend Roger, female   DOB: 1946-02-14, 66 y.o.   MRN: [id number]   Patient Name: TAKYLA KUCHERA Date of Encounter: 05/19/2012    SUBJECTIVE No more chest pain.   CURRENT MEDS    . aspirin EC  325 mg Oral Daily  . bivalirudin      . clopidogrel      . clopidogrel  75 mg Oral Q breakfast  . ezetimibe-simvastatin  1 tablet Oral QHS  . famotidine      . fentaNYL      . metoprolol  25 mg Oral BID  . nitroGLYCERIN  1 spray Sublingual Once  . pantoprazole sodium  40 mg Oral Daily  . DISCONTD: aspirin  324 mg Oral NOW  . DISCONTD: aspirin EC  81 mg Oral Daily  . DISCONTD: aspirin  300 mg Rectal NOW  . DISCONTD: sodium chloride  3 mL Intravenous Q12H    OBJECTIVE  Filed Vitals:   05/18/12 2300 05/19/12 0024 05/19/12 0420 05/19/12 0500  BP:  122/60 121/61   Pulse: 55 59 54 57  Temp:  98.1 F (36.7 C) 98 F (36.7 C)   TempSrc:  Oral Oral   Resp:  14 19 18   Height:      Weight:  174 lb 9.7 oz (79.2 kg)    SpO2:  96% 96% 94%    Intake/Output Summary (Last 24 hours) at 05/19/12 0757 Last data filed at 05/19/12 0410  Gross per 24 hour  Intake  484.4 ml  Output   1000 ml  Net -515.6 ml   Filed Weights   05/17/12 1227 05/17/12 1955 05/19/12 0024  Weight: 164 lb (74.39 kg) 169 lb 1.6 oz (76.703 kg) 174 lb 9.7 oz (79.2 kg)    PHYSICAL EXAM  General: Pleasant, NAD. Neuro: Alert and oriented X 3. Moves all extremities spontaneously. Psych: Normal affect. HEENT:  Normal  Neck: Supple without bruits or JVD. Lungs:  Resp regular and unlabored, CTA. Heart: RRR no s3, s4, or murmurs. Abdomen: Soft, non-tender, non-distended, BS + x 4.  Extremities: No clubbing, cyanosis or edema. DP/PT/Radials 2+ and equal bilaterally.  Radial cath site stable  Accessory Clinical Findings  CBC  Basename 05/19/12 0500 05/18/12 0600  WBC 7.2 7.2  NEUTROABS -- --  HGB 12.5 12.6  HCT 36.9 36.7  MCV 91.3 89.5  PLT 211 231   Basic Metabolic Panel  Basename 05/19/12 0500  05/18/12 0600  NA 140 138  K 3.8 4.1  CL 105 105  CO2 27 23  GLUCOSE 106* 110*  BUN 9 10  CREATININE 0.81 0.68  CALCIUM 9.6 9.5  MG -- --  PHOS -- --   Liver Function Tests  Basename 05/17/12 1256  AST 19  ALT 11  ALKPHOS 75  BILITOT 0.3  PROT 6.6  ALBUMIN 3.6   No results found for this basename: LIPASE:2,AMYLASE:2 in the last 72 hours Cardiac Enzymes  Basename 05/18/12 0600 05/18/12 0033  CKTOTAL 97 101  CKMB 5.3* 6.0*  CKMBINDEX -- --  TROPONINI 0.53* 0.76*   BNP No components found with this basename: POCBNP:3 D-Dimer No results found for this basename: DDIMER:2 in the last 72 hours Hemoglobin A1C No results found for this basename: HGBA1C in the last 72 hours Fasting Lipid Panel  Basename 05/18/12 0600  CHOL 160  HDL 44  LDLCALC 72  TRIG 220*  CHOLHDL 3.6  LDLDIRECT --   Thyroid Function Tests  Basename 05/17/12 2135  TSH 1.946  T4TOTAL --  T3FREE --  THYROIDAB --    TELE  Sinus rhythm  ECG    Radiology/Studies  Dg Chest Portable 1 View  05/17/2012  *RADIOLOGY REPORT*  Clinical Data: Chest pain  PORTABLE CHEST - 1 VIEW  Comparison: 03/15/2009  Findings: The cardiomediastinal silhouette is unremarkable. The lungs are clear. There is no evidence of focal airspace disease, pulmonary edema, suspicious pulmonary nodule/mass, pleural effusion, or pneumothorax. No acute bony abnormalities are identified.  IMPRESSION: No evidence of active cardiopulmonary disease.   Original Report Authenticated By: Rosendo Gros, M.D.     ASSESSMENT AND PLAN  Active Problems:  NSTEMI (non-ST elevated myocardial infarction)  ready for discharge. Dual platelet therapy for a year. If restenosis in the future will probably need CABG. Smoking cessation reinforced. Followup in the office in 2 weeks. Cardiac rehabilitation recommended.  Signed, Valera Castle MD

## 2012-05-19 NOTE — Discharge Summary (Signed)
Patient ID: Bianca [name],  MRN: [id number], DOB/AGE: Aug 06, 1946 66 y.o.  Admit date: 05/17/2012 Discharge date: 05/19/2012  Primary Care Provider: Judie Petit Primary Cardiologist: Lovina Reach, MD  Discharge Diagnoses Principal Problem:  *NSTEMI (non-ST elevated myocardial infarction)  **s/p PCI/DES to distal RCA and PTCA of RPDA this admission. Active Problems:  CORONARY ARTERY DISEASE  HYPERLIPIDEMIA  TOBACCO USE  HYPERTENSION  GERD  Allergies Allergies  Allergen Reactions  . Oxycodone Hcl Itching and Other (See Comments)    confusion  . Penicillins Rash    Procedures  Cardiac Catheterization and Percutaneous Coronary Intervention 05/18/2012  Hemodynamics: AO:  107/57   mmHg LV:  105/11    mmHg LVEDP: 19   mmHg  Coronary angiography: Coronary dominance: Right      Left Main:  Normal in size and free of significant disease.     Left Anterior Descending (LAD):  Normal in size with mild calcifications and atherosclerosis. There is a 60-70% lesion in the mid LAD which appears to be stable from previous cardiac catheterization. The rest of the LAD has minor irregularities.     1st diagonal (D1):  Large in size with mild 20% stenosis at the ostium.     2nd diagonal (D2):  Very small in size.     3rd diagonal (D3):  Very small in size.     Circumflex (LCx):  Normal in size and nondominant. There is a 50% lesion in the midsegment. The rest of the vessel has minor irregularities.     1st obtuse marginal:  Small in size and free of significant disease.     2nd obtuse marginal:  Very small in size.  3rd obtuse marginal:  Normal in size with minor irregularities.     The AV groove continuation is free of significant disease and gives one posterior lateral branch.       Right Coronary Artery: Normal in size and dominant. Multiple overlapped stents are noted from the proximal to the distal segment. In the midsegment there is a 40-50% in-stent restenosis. Distally, there  is a 95% stenosis and a 60% stenosis more proximal to that.    1. The RCA was treated with a 2.75 x 33 mm Xience expedition DES  posterior descending artery: Normal in size with 80% disease in the midsegment. The vessel diameter is 2 mm in that location.    1. The PDA was treated with PTCA only.  posterior lateral branch:  Normal in size with minor irregularities.    Left ventriculography: Left ventricular systolic function is normal , LVEF is estimated at 60 %.  _____________  History of Present Illness  66 y/o female with prior h/o CAD s/p RCA stenting in the past who was in her USOH until the day of admission when she began to experience substernal chest discomfort radiating to her jaw associated with nausea, vomiting, and dyspnea.  When symptoms persisted despite nexium, nsaid, and sublingual NTG, she presented to the [facility] ED for evaluation.  There, initial cardiac markers were normal and ECG was non-acute.  She was placed on IV heparin and admitted for further evaluation.  Hospital Course  Pt ruled in for NSTEMI, eventually peaking her CK @ 101, MB @ 6.0, and Troponin I @ 0.76.  Decision was made to pursue diagnostic catheterization, which was performed on 10/1, revealing severe distal RCA and mid RPDA stenosis.  The distal RCA was successfully stented with a DES, while the RPDA was treated with PTCA only.  She continued  to have chest pain post-procedure and IV ntg was maintained, though later discontinued secondary to headache.  She had no further chest pain following discontinuation of IV NTG.  This AM, she has been ambulating without difficulty or recurrent symptoms.  We have counseled her on the importance of lifelong smoking cessation.  She will be discharged home today in good condition and we have arranged for early f/u in our office within 7 days.  Discharge Vitals Blood pressure 119/56, pulse 54, temperature 98 F (36.7 C), temperature source Oral, resp. rate 19, height 5\' 3"  (1.6  m), weight 174 lb 9.7 oz (79.2 kg), SpO2 98.00%.  Filed Weights   05/17/12 1227 05/17/12 1955 05/19/12 0024  Weight: 164 lb (74.39 kg) 169 lb 1.6 oz (76.703 kg) 174 lb 9.7 oz (79.2 kg)   Labs  CBC  Basename 05/19/12 0500 05/18/12 0600  WBC 7.2 7.2  NEUTROABS -- --  HGB 12.5 12.6  HCT 36.9 36.7  MCV 91.3 89.5  PLT 211 231   Basic Metabolic Panel  Basename 05/19/12 0500 05/18/12 0600  NA 140 138  K 3.8 4.1  CL 105 105  CO2 27 23  GLUCOSE 106* 110*  BUN 9 10  CREATININE 0.81 0.68  CALCIUM 9.6 9.5  MG -- --  PHOS -- --   Liver Function Tests  Basename 05/17/12 1256  AST 19  ALT 11  ALKPHOS 75  BILITOT 0.3  PROT 6.6  ALBUMIN 3.6   Cardiac Enzymes  Basename 05/18/12 0600 05/18/12 0033  CKTOTAL 97 101  CKMB 5.3* 6.0*  CKMBINDEX -- --  TROPONINI 0.53* 0.76*   Fasting Lipid Panel  Basename 05/18/12 0600  CHOL 160  HDL 44  LDLCALC 72  TRIG 220*  CHOLHDL 3.6  LDLDIRECT --   Thyroid Function Tests  Basename 05/17/12 2135  TSH 1.946  T4TOTAL --  T3FREE --  THYROIDAB --   Disposition  Pt is being discharged home today in good condition.  Follow-up Plans & Appointments  Follow-up Information    Follow up with Tereso Newcomer, PA. On 05/26/2012. (12:20 - Dr. Tenny Craw' PA)    Contact information:   [address] [place] [address] [phone]       Follow up with Judie Petit, MD. (as scheduled)    Contact information:   [address] [place] [address] [phone]        Discharge Medications    Medication List     As of 05/19/2012  9:51 AM    STOP taking these medications         esomeprazole 40 MG capsule   Commonly known as: NEXIUM      TAKE these medications         aspirin 81 MG EC tablet   Take 1 tablet (81 mg total) by mouth daily.      clobetasol 0.05 % external solution   Commonly known as: TEMOVATE   Apply 1 application topically daily as needed. Skin dryness      clopidogrel 75 MG  tablet   Commonly known as: PLAVIX   Take 1 tablet (75 mg total) by mouth daily.      ezetimibe-simvastatin 10-80 MG per tablet   Commonly known as: VYTORIN   Take 1 tablet by mouth at bedtime.      metoprolol 50 MG tablet   Commonly known as: LOPRESSOR   Take 25 mg by mouth 2 (two) times daily.      nitroGLYCERIN  0.4 MG SL tablet   Commonly known as: NITROSTAT   Place 1 tablet (0.4 mg total) under the tongue every 5 (five) minutes as needed. Chest pain      pantoprazole 40 MG tablet   Commonly known as: PROTONIX   Take 1 tablet (40 mg total) by mouth daily.      traMADol 50 MG tablet   Commonly known as: ULTRAM   Take 1 tablet by mouth Once daily as needed. For pain      zolpidem 10 MG tablet   Commonly known as: AMBIEN   Take 10 mg by mouth at bedtime as needed. For sleep      Outstanding Labs/Studies  None  Duration of Discharge Encounter   Greater than 30 minutes including physician time.  Signed, Nicolasa Ducking NP 05/19/2012, 9:51 AM   Jesse Sans. Daleen Squibb, MD, [facility] Pager:  [phone]

## 2012-05-20 ENCOUNTER — Telehealth: Payer: Self-pay | Admitting: Pharmacist

## 2012-05-20 NOTE — Telephone Encounter (Signed)
Message copied by Velda Shell on Thu May 20, 2012  4:53 PM ------      Message from: Sherrilyn Rist      Created: Wed May 19, 2012  9:46 AM       Kennon Rounds, 7 days transition care seeing SW on 10/9.                  gesila

## 2012-05-20 NOTE — Telephone Encounter (Signed)
**Note De-identified Bianca [name] Obfuscation** No answer and no way to leave message, will continue to call./LV 

## 2012-05-21 NOTE — Telephone Encounter (Signed)
Left detailed message on VM./LV 

## 2012-05-24 ENCOUNTER — Telehealth: Payer: Self-pay | Admitting: Internal Medicine

## 2012-05-24 MED ORDER — ZOLPIDEM TARTRATE 10 MG PO TABS: 10.0000 mg | ORAL_TABLET | Freq: Every evening | ORAL | Status: DC | PRN

## 2012-05-24 NOTE — Telephone Encounter (Signed)
Pt called req refill of zolpidem (AMBIEN) 10 MG tablet to be called in to [facility] [address]. Pt is completely out of med.     Pt also needs this send in to [facility] mail order pharmacy.

## 2012-05-24 NOTE — Telephone Encounter (Signed)
Pt needs OV, called pt and she has an appt with cardiologist soon.  Gave her one month supply and called in to [facility] [address] pt verbalized understanding that she needed to call back for an appt ok per Dr Cato Mulligan

## 2012-05-26 ENCOUNTER — Encounter: Payer: Self-pay | Admitting: Physician Assistant

## 2012-05-26 ENCOUNTER — Ambulatory Visit (INDEPENDENT_AMBULATORY_CARE_PROVIDER_SITE_OTHER): Payer: Managed Care, Other (non HMO) | Admitting: Physician Assistant

## 2012-05-26 VITALS — BP 132/77 | HR 65 | Ht 63.0 in | Wt 168.0 lb

## 2012-05-26 DIAGNOSIS — E785 Hyperlipidemia, unspecified: Secondary | ICD-10-CM

## 2012-05-26 DIAGNOSIS — I25810 Atherosclerosis of coronary artery bypass graft(s) without angina pectoris: Secondary | ICD-10-CM

## 2012-05-26 DIAGNOSIS — I10 Essential (primary) hypertension: Secondary | ICD-10-CM

## 2012-05-26 DIAGNOSIS — R300 Dysuria: Secondary | ICD-10-CM

## 2012-05-26 DIAGNOSIS — F329 Major depressive disorder, single episode, unspecified: Secondary | ICD-10-CM

## 2012-05-26 LAB — URINALYSIS, ROUTINE W REFLEX MICROSCOPIC
Bilirubin Urine: NEGATIVE
Nitrite: NEGATIVE
Total Protein, Urine: NEGATIVE
Urine Glucose: NEGATIVE
pH: 5.5 (ref 5.0–8.0)

## 2012-05-26 NOTE — Progress Notes (Signed)
[address] [place], [place]  [address] Phone: [phone] Fax:  [phone]  Date:  05/26/2012   Name:  Bianca [name]   DOB:  Apr 13, 1946   MRN:  [id number]  PCP:  Judie Petit, MD  Primary Cardiologist:  Dr. Dietrich Pates  Primary Electrophysiologist:  None    History of Present Illness: Bianca [name] is a 66 y.o. female who returns for follow up after recent hospitalization.  She has a history of CAD, s/p MI in 1997 treated with PCI to the RCA, s/p Taxus DES x3 the RCA in 2007 in the setting of MI, HTN, HL. Echo 7/10: EF 55-60%, mild MR.  She was recently admitted 9/30-10/2 with a non-STEMI. Patient presented with chest pain radiating to her jaw. Peak troponin was 0.76. LHC 05/18/12:  mLAD 60-70%, oD1 20%, mCFX 50%, proximal to distal RCA with multiple overlapping stents, mid 40-50% ISR, distal 60/95%, mPDA 80%, EF 60%. PCI:  Xience expedition DES to the RCA and balloon angioplasty to the PDA. It was noted that if the patient develops further restenosis in the RCA, best treatment option would probably CABG.  Since d/c, the patient denies chest pain, shortness of breath, syncope, orthopnea, PND or significant pedal edema.  She is limited by back and hip pain.  She is depressed.  No SI's.  Sleeping ok.    Labs (10/13):  K 3.8, creatinine 0.81, ALT 11, LDL 72, Hgb 12.5, TSH 1.946   Wt Readings from Last 3 Encounters:  05/26/12 168 lb (76.204 kg)  05/19/12 174 lb 9.7 oz (79.2 kg)  05/19/12 174 lb 9.7 oz (79.2 kg)     Past Medical History  Diagnosis Date  . CAD (coronary artery disease)     a. 1997 MI/PCI RCA;  b. 2007 MI/PCI of 100% RCA with Taxus DES x 3 placed, EF 55%;  c. 2010 Cath: stable anatomy;  d. 05/2012 NSTEMI/Cath/PCI: LM nl, LAD 60-67m, D1 20ost, LCX 85m, RCA 40-61m ISR, 60/95d (Treated w/ 2.75x33 Xience Xpedition DES), PDA 69m (Treated w/ PTCA), EF 60%.  . Depression   . Hyperlipidemia   . Hypertension   . Carpal tunnel syndrome on both  sides   . Numbness of foot     "never recovered from last back OR, 2009" (05/17/2012)  . GERD (gastroesophageal reflux disease)   . Arthritis     "back; probably knees" (05/17/2012)  . Chronic lower back pain     Current Outpatient Prescriptions  Medication Sig Dispense Refill  . aspirin EC 81 MG EC tablet Take 1 tablet (81 mg total) by mouth daily.  30 tablet    . clobetasol (TEMOVATE) 0.05 % external solution Apply 1 application topically daily as needed. Skin dryness      . clopidogrel (PLAVIX) 75 MG tablet Take 1 tablet (75 mg total) by mouth daily.  30 tablet  6  . ezetimibe-simvastatin (VYTORIN) 10-80 MG per tablet Take 1 tablet by mouth at bedtime.      . metoprolol (LOPRESSOR) 50 MG tablet Take 25 mg by mouth 2 (two) times daily.      . nitroGLYCERIN (NITROSTAT) 0.4 MG SL tablet Place 1 tablet (0.4 mg total) under the tongue every 5 (five) minutes as needed. Chest pain  25 tablet  3  . pantoprazole (PROTONIX) 40 MG tablet Take 1 tablet (40 mg total) by mouth daily.  30 tablet  6  . traMADol (ULTRAM) 50 MG tablet Take 1 tablet by mouth Once daily  as needed. For pain      . zolpidem (AMBIEN) 10 MG tablet Take 1 tablet (10 mg total) by mouth at bedtime as needed. For sleep  30 tablet  0    Allergies: Allergies  Allergen Reactions  . Oxycodone Hcl Itching and Other (See Comments)    confusion  . Penicillins Rash    History  Substance Use Topics  . Smoking status: Former Smoker -- 0.1 packs/day for 30 years    Types: Cigarettes  . Smokeless tobacco: Never Used  . Alcohol Use: No     ROS:  Please see the history of present illness.   Complaining of dysuria.  No fever or flank pain.   All other systems reviewed and negative.   PHYSICAL EXAM: VS:  BP 132/77  Pulse 65  Ht 5\' 3"  (1.6 m)  Wt 168 lb (76.204 kg)  BMI 29.76 kg/m2 Well nourished, well developed, in no acute distress HEENT: normal Neck: no JVD Cardiac:  normal S1, S2; RRR; no murmur Lungs:  clear to  auscultation bilaterally, no wheezing, rhonchi or rales Abd: soft, nontender, no hepatomegaly Ext: no edema; right wrist without hematoma or bruit  Skin: warm and dry Neuro:  CNs 2-12 intact, no focal abnormalities noted  EKG:  NSR, HR 65, normal axis, nonspecific ST-T wave changes      ASSESSMENT AND PLAN:  1. Coronary Artery Disease: Patient is stable after PCI of her recent non-STEMI.  We discussed the importance of dual antiplatelet therapy. She is unable to attend cardiac rehabilitation due to her back and hip pain. She will try to increase walking on her own. Followup with Dr. Tenny Craw in 6-8 weeks.  2. Hyperlipidemia: Managed by primary care. LDL optimal.  3. Hypertension: Controlled.  4. Tobacco Abuse: She has quit smoking.  5. Depression:  I considered placing her on Cymbalta and having her followup with her PCP in the next 2 weeks. However, she has to take tramadol 3 times a day for pain control. I'm not comfortable giving her Cymbalta along with scheduled doses of tramadol due to risk of serotonin syndrome. Therefore, I have asked her to followup with her PCP and let him decide the best course of action for her depression. She agrees with this plan.  6. Dysuria: Check urinalysis today.  Signed, Tereso Newcomer, PA-C  2:43 PM 05/26/2012

## 2012-05-26 NOTE — Patient Instructions (Addendum)
Your physician recommends that you return for lab work in: URINALYSIS DX DYSURIA  Your physician recommends that you schedule a follow-up appointment in: 6-8 WEEKS WITH DR. ROSS  Your physician recommends that you schedule a follow-up WITH DR. Cato Mulligan FOR DEPRESSION

## 2012-05-27 ENCOUNTER — Telehealth: Payer: Self-pay | Admitting: *Deleted

## 2012-05-27 DIAGNOSIS — R300 Dysuria: Secondary | ICD-10-CM

## 2012-05-27 MED ORDER — CIPROFLOXACIN HCL 250 MG PO TABS: 250.0000 mg | ORAL_TABLET | Freq: Two times a day (BID) | ORAL | Status: DC

## 2012-05-27 NOTE — Telephone Encounter (Signed)
Message copied by Tarri Fuller on Thu May 27, 2012  4:37 PM ------      Message from: [place], [place] T      Created: Thu May 27, 2012  8:51 AM       Can we get a culture and sensitivity added on to her urine?      If this is not possible, have her take Cipro 250 mg BID x 3 days and follow up with her PCP if dysuria does not resolve.      Tereso Newcomer, PA-C  8:50 AM 05/27/2012

## 2012-05-27 NOTE — Telephone Encounter (Signed)
.  pt notified about u/a results. cipro 250 bid x 3 days was sent into [facility] [address] today, pt aware to also f/u w/PCP if dysuria not any better

## 2012-05-31 ENCOUNTER — Ambulatory Visit (INDEPENDENT_AMBULATORY_CARE_PROVIDER_SITE_OTHER): Payer: Managed Care, Other (non HMO) | Admitting: Family

## 2012-05-31 ENCOUNTER — Encounter: Payer: Self-pay | Admitting: Family

## 2012-05-31 VITALS — BP 124/86 | HR 74 | Temp 98.1°F | Wt 167.0 lb

## 2012-05-31 DIAGNOSIS — E785 Hyperlipidemia, unspecified: Secondary | ICD-10-CM

## 2012-05-31 DIAGNOSIS — F329 Major depressive disorder, single episode, unspecified: Secondary | ICD-10-CM

## 2012-05-31 DIAGNOSIS — M169 Osteoarthritis of hip, unspecified: Secondary | ICD-10-CM

## 2012-05-31 MED ORDER — ESCITALOPRAM OXALATE 10 MG PO TABS: 10.0000 mg | ORAL_TABLET | Freq: Every day | ORAL | Status: DC

## 2012-05-31 MED ORDER — CLOBETASOL PROPIONATE 0.05 % EX SOLN: 1.0000 "application " | Freq: Every day | CUTANEOUS | Status: DC | PRN

## 2012-05-31 MED ORDER — TRAMADOL HCL 50 MG PO TABS: 100.0000 mg | ORAL_TABLET | Freq: Three times a day (TID) | ORAL | Status: DC | PRN

## 2012-05-31 MED ORDER — ZOLPIDEM TARTRATE 10 MG PO TABS: 10.0000 mg | ORAL_TABLET | Freq: Every evening | ORAL | Status: DC | PRN

## 2012-05-31 MED ORDER — EZETIMIBE-SIMVASTATIN 10-80 MG PO TABS: 1.0000 | ORAL_TABLET | Freq: Every day | ORAL | Status: DC

## 2012-05-31 NOTE — Patient Instructions (Addendum)

## 2012-05-31 NOTE — Progress Notes (Signed)
Subjective:    Patient ID: Bianca [name], female    DOB: 1946-05-01, 66 y.o.   MRN: [id number]  HPI 66 year old white female, nonsmoker, patient of Dr. Cato Mulligan is in today for recheck of hypertension hyperlipidemia, osteoarthritis, and insomnia. Patient reports worsening right hip pain and is due to have a hip replacement. However, since she had a heart attack less than one year ago her surgeries been on hold. Patient reports being under a tremendous amount of stress over the last year. Reports the death of her mother, husband having a stroke, and her personally have a myocardial infarction . She has anxiety and depression.She has been on Prozac in the past and has been ineffective. Patient denies any feelings of helplessness or hopelessness, no thoughts of death or dying.   Review of Systems  Constitutional: Negative.   HENT: Negative.   Respiratory: Negative.   Cardiovascular: Negative.   Gastrointestinal: Negative.   Genitourinary: Negative.   Musculoskeletal: Positive for arthralgias.       Left hip pain  Skin: Negative.   Neurological: Negative.   Hematological: Negative.   Psychiatric/Behavioral: Positive for disturbed wake/sleep cycle. The patient is nervous/anxious.    Past Medical History  Diagnosis Date  . CAD (coronary artery disease)     a. 1997 MI/PCI RCA;  b. 2007 MI/PCI of 100% RCA with Taxus DES x 3 placed, EF 55%;  c. 2010 Cath: stable anatomy;  d. 05/2012 NSTEMI/Cath/PCI: LM nl, LAD 60-39m, D1 20ost, LCX 28m, RCA 40-50m ISR, 60/95d (Treated w/ 2.75x33 Xience Xpedition DES), PDA 58m (Treated w/ PTCA), EF 60%.  . Depression   . Hyperlipidemia   . Hypertension   . Carpal tunnel syndrome on both sides   . Numbness of foot     "never recovered from last back OR, 2009" (05/17/2012)  . GERD (gastroesophageal reflux disease)   . Arthritis     "back; probably knees" (05/17/2012)  . Chronic lower back pain     History   Social History  . Marital Status: Married   Spouse Name: N/A    Number of Children: N/A  . Years of Education: N/A   Occupational History  . Not on file.   Social History Main Topics  . Smoking status: Former Smoker -- 0.1 packs/day for 30 years    Types: Cigarettes  . Smokeless tobacco: Never Used  . Alcohol Use: No  . Drug Use: No  . Sexually Active: Not Currently   Other Topics Concern  . Not on file   Social History Narrative  . No narrative on file    Past Surgical History  Procedure Date  . Cesarean section 1990  . Back surgery   . Ankle surgery     left; "had to take out a floater" (05/17/2012)  . Posterior fusion lumbar spine 1962; 2009  . Coronary angioplasty with stent placement 1997; 2007    "2 + 2; total of 4"    Family History  Problem Relation Age of Onset  . Coronary artery disease Mother   . Diabetes Mother   . Hypertension Mother   . Dementia Father   . Sudden death Brother     suicide  . Cancer Paternal Grandfather     esophageal    Allergies  Allergen Reactions  . Oxycodone Hcl Itching and Other (See Comments)    confusion  . Penicillins Rash    Current Outpatient Prescriptions on File Prior to Visit  Medication Sig Dispense Refill  . aspirin EC  81 MG EC tablet Take 1 tablet (81 mg total) by mouth daily.  30 tablet    . ciprofloxacin (CIPRO) 250 MG tablet Take 1 tablet (250 mg total) by mouth 2 (two) times daily.  6 tablet  0  . clopidogrel (PLAVIX) 75 MG tablet Take 1 tablet (75 mg total) by mouth daily.  30 tablet  6  . metoprolol (LOPRESSOR) 50 MG tablet Take 25 mg by mouth 2 (two) times daily.      . nitroGLYCERIN (NITROSTAT) 0.4 MG SL tablet Place 1 tablet (0.4 mg total) under the tongue every 5 (five) minutes as needed. Chest pain  25 tablet  3  . pantoprazole (PROTONIX) 40 MG tablet Take 1 tablet (40 mg total) by mouth daily.  30 tablet  6  . DISCONTD: ezetimibe-simvastatin (VYTORIN) 10-80 MG per tablet Take 1 tablet by mouth at bedtime.      Marland Kitchen DISCONTD: zolpidem (AMBIEN) 10  MG tablet Take 1 tablet (10 mg total) by mouth at bedtime as needed. For sleep  30 tablet  0  . escitalopram (LEXAPRO) 10 MG tablet Take 1 tablet (10 mg total) by mouth daily.  30 tablet  3    BP 124/86  Pulse 74  Temp 98.1 F (36.7 C) (Oral)  Wt 167 lb (75.751 kg)  SpO2 98%chart    Objective:   Physical Exam  Constitutional: She is oriented to person, place, and time. She appears well-developed and well-nourished.  HENT:  Right Ear: External ear normal.  Left Ear: External ear normal.  Nose: Nose normal.  Mouth/Throat: Oropharynx is clear and moist.  Eyes: Pupils are equal, round, and reactive to light.  Neck: Normal range of motion. Neck supple. No thyromegaly present.  Cardiovascular: Normal rate, regular rhythm and normal heart sounds.   Pulmonary/Chest: Effort normal and breath sounds normal.  Abdominal: Soft. Bowel sounds are normal.  Musculoskeletal: She exhibits tenderness. She exhibits no edema.       Tenderness to palpation of the right hip.   Neurological: She is alert and oriented to person, place, and time.  Skin: Skin is warm and dry.  Psychiatric: She has a normal mood and affect.          Assessment & Plan:  Assessment: Osteoarthritis-hip, anxiety and depression, hypertension, hyperlipidemia, insomnia  Plan: Increase tramadol to 2 tablets every 8 hours when necessary pain. Start Lexapro 10 mg once daily. We'll bring patient back for recheck in 3 weeks and sooner when necessary. Continue all other medications the same.

## 2012-06-23 ENCOUNTER — Encounter: Payer: Self-pay | Admitting: Family

## 2012-06-23 ENCOUNTER — Ambulatory Visit (INDEPENDENT_AMBULATORY_CARE_PROVIDER_SITE_OTHER): Payer: Managed Care, Other (non HMO) | Admitting: Family

## 2012-06-23 VITALS — BP 114/60 | HR 70 | Wt 165.0 lb

## 2012-06-23 DIAGNOSIS — F329 Major depressive disorder, single episode, unspecified: Secondary | ICD-10-CM

## 2012-06-23 NOTE — Progress Notes (Signed)
Subjective:    Patient ID: Bianca [name], female    DOB: [birth year], 66 y.o.   MRN: [id number]  HPI 66 year old white female seen for follow up of trail with Lexapro 10mg  daily. Reports feeling 50% better on current dosage. States that home life has improved husband has returned to work and sleep and eating habit are good. Reports being fine on current dose and not needing an increase in dosage.   Review of Systems  Constitutional: Negative.   HENT: Negative.   Eyes: Negative.   Respiratory: Negative.   Cardiovascular: Negative.   Gastrointestinal: Positive for constipation.  Genitourinary: Negative.   Musculoskeletal:       Reports hip and back pain walking extended distances. Reports that she need to be dropped off at entrances, instead of walking from parking lot  Skin: Negative.   Neurological: Negative.   Hematological: Negative.   Psychiatric/Behavioral: Negative.    Past Medical History  Diagnosis Date  . CAD (coronary artery disease)     a. 1997 MI/PCI RCA;  b. 2007 MI/PCI of 100% RCA with Taxus DES x 3 placed, EF 55%;  c. 2010 Cath: stable anatomy;  d. 05/2012 NSTEMI/Cath/PCI: LM nl, LAD 60-2m, D1 20ost, LCX 59m, RCA 40-4m ISR, 60/95d (Treated w/ 2.75x33 Xience Xpedition DES), PDA 72m (Treated w/ PTCA), EF 60%.  . Depression   . Hyperlipidemia   . Hypertension   . Carpal tunnel syndrome on both sides   . Numbness of foot     "never recovered from last back OR, 2009" (05/17/2012)  . GERD (gastroesophageal reflux disease)   . Arthritis     "back; probably knees" (05/17/2012)  . Chronic lower back pain     History   Social History  . Marital Status: Married    Spouse Name: N/A    Number of Children: N/A  . Years of Education: N/A   Occupational History  . Not on file.   Social History Main Topics  . Smoking status: Former Smoker -- 0.1 packs/day for 30 years    Types: Cigarettes  . Smokeless tobacco: Never Used  . Alcohol Use: No  . Drug Use: No  .  Sexually Active: Not Currently   Other Topics Concern  . Not on file   Social History Narrative  . No narrative on file    Past Surgical History  Procedure Date  . Cesarean section 1990  . Back surgery   . Ankle surgery     left; "had to take out a floater" (05/17/2012)  . Posterior fusion lumbar spine 1962; 2009  . Coronary angioplasty with stent placement 1997; 2007    "2 + 2; total of 4"    Family History  Problem Relation Age of Onset  . Coronary artery disease Mother   . Diabetes Mother   . Hypertension Mother   . Dementia Father   . Sudden death Brother     suicide  . Cancer Paternal Grandfather     esophageal    Allergies  Allergen Reactions  . Oxycodone Hcl Itching and Other (See Comments)    confusion  . Penicillins Rash    Current Outpatient Prescriptions on File Prior to Visit  Medication Sig Dispense Refill  . aspirin EC 81 MG EC tablet Take 1 tablet (81 mg total) by mouth daily.  30 tablet    . clobetasol (TEMOVATE) 0.05 % external solution Apply 1 application topically daily as needed. Skin dryness  50 mL  1  .  clopidogrel (PLAVIX) 75 MG tablet Take 1 tablet (75 mg total) by mouth daily.  30 tablet  6  . escitalopram (LEXAPRO) 10 MG tablet Take 1 tablet (10 mg total) by mouth daily.  30 tablet  3  . ezetimibe-simvastatin (VYTORIN) 10-80 MG per tablet Take 1 tablet by mouth at bedtime.  30 tablet  4  . metoprolol (LOPRESSOR) 50 MG tablet Take 25 mg by mouth 2 (two) times daily.      . nitroGLYCERIN (NITROSTAT) 0.4 MG SL tablet Place 1 tablet (0.4 mg total) under the tongue every 5 (five) minutes as needed. Chest pain  25 tablet  3  . pantoprazole (PROTONIX) 40 MG tablet Take 1 tablet (40 mg total) by mouth daily.  30 tablet  6  . traMADol (ULTRAM) 50 MG tablet Take 2 tablets (100 mg total) by mouth every 8 (eight) hours as needed for pain. For pain  180 tablet  3  . zolpidem (AMBIEN) 10 MG tablet Take 1 tablet (10 mg total) by mouth at bedtime as needed.  For sleep  30 tablet  3  . ciprofloxacin (CIPRO) 250 MG tablet Take 1 tablet (250 mg total) by mouth 2 (two) times daily.  6 tablet  0    BP 114/60  Pulse 70  Wt 165 lb (74.844 kg)  SpO2 96%chart    Objective:   Physical Exam  Vitals reviewed. Constitutional: She is oriented to person, place, and time. She appears well-developed and well-nourished.  HENT:  Head: Normocephalic.  Eyes: Pupils are equal, round, and reactive to light.  Neck: Normal range of motion. Neck supple.  Cardiovascular: Normal rate, regular rhythm and normal heart sounds.   Pulmonary/Chest: Effort normal and breath sounds normal.  Abdominal: Soft.  Genitourinary:       Deferred  Neurological: She is alert and oriented to person, place, and time.  Skin: Skin is warm and dry.  Psychiatric: She has a normal mood and affect. Her behavior is normal. Judgment and thought content normal.          Assessment & Plan:   Assessment: Depression  Plan: Continue to Lexapro 10mg  daily.Complete documentation of handicap placard.  Follow up in three month or sooner if needed

## 2012-06-23 NOTE — Patient Instructions (Addendum)

## 2012-07-19 ENCOUNTER — Encounter: Payer: Self-pay | Admitting: Internal Medicine

## 2012-07-19 ENCOUNTER — Ambulatory Visit (INDEPENDENT_AMBULATORY_CARE_PROVIDER_SITE_OTHER): Payer: Managed Care, Other (non HMO) | Admitting: Internal Medicine

## 2012-07-19 VITALS — BP 129/73 | HR 59 | Ht 63.0 in | Wt 163.1 lb

## 2012-07-19 DIAGNOSIS — E785 Hyperlipidemia, unspecified: Secondary | ICD-10-CM

## 2012-07-19 DIAGNOSIS — I25810 Atherosclerosis of coronary artery bypass graft(s) without angina pectoris: Secondary | ICD-10-CM

## 2012-07-19 NOTE — Progress Notes (Signed)
HPI Bianca [name] is a 66 y.o. female with a history of CAD, s/p MI in 1997 treated with PCI to the RCA, s/p Taxus DES x3 the RCA in 2007 in the setting of MI, HTN, HL. Echo 7/10: EF 55-60%, mild MR. She was recently admitted 9/30-10/2 with a non-STEMI. Patient presented with chest pain radiating to her jaw. Peak troponin was 0.76. LHC 05/18/12: mLAD 60-70%, oD1 20%, mCFX 50%, proximal to distal RCA with multiple overlapping stents, mid 40-50% ISR, distal 60/95%, mPDA 80%, EF 60%. PCI: Xience expedition DES to the RCA and balloon angioplasty to the PDA. It was noted that if the patient develops further restenosis in the RCA, best treatment option would probably CABG.  Since d/c, the patient denies chest pain, shortness of breath, syncope, orthopnea, PND or significant pedal edema.      Allergies  Allergen Reactions  . Oxycodone Hcl Itching and Other (See Comments)    confusion  . Penicillins Rash    Current Outpatient Prescriptions  Medication Sig Dispense Refill  . aspirin EC 81 MG EC tablet Take 1 tablet (81 mg total) by mouth daily.  30 tablet    . ciprofloxacin (CIPRO) 250 MG tablet Take 1 tablet (250 mg total) by mouth 2 (two) times daily.  6 tablet  0  . clobetasol (TEMOVATE) 0.05 % external solution Apply 1 application topically daily as needed. Skin dryness  50 mL  1  . clopidogrel (PLAVIX) 75 MG tablet Take 1 tablet (75 mg total) by mouth daily.  30 tablet  6  . escitalopram (LEXAPRO) 10 MG tablet Take 1 tablet (10 mg total) by mouth daily.  30 tablet  3  . ezetimibe-simvastatin (VYTORIN) 10-80 MG per tablet Take 1 tablet by mouth at bedtime.  30 tablet  4  . metoprolol (LOPRESSOR) 50 MG tablet Take 25 mg by mouth 2 (two) times daily.      . nitroGLYCERIN (NITROSTAT) 0.4 MG SL tablet Place 1 tablet (0.4 mg total) under the tongue every 5 (five) minutes as needed. Chest pain  25 tablet  3  . pantoprazole (PROTONIX) 40 MG tablet Take 1 tablet (40 mg total) by mouth daily.  30 tablet  6    . traMADol (ULTRAM) 50 MG tablet Take 2 tablets (100 mg total) by mouth every 8 (eight) hours as needed for pain. For pain  180 tablet  3  . zolpidem (AMBIEN) 10 MG tablet Take 1 tablet (10 mg total) by mouth at bedtime as needed. For sleep  30 tablet  3    Past Medical History  Diagnosis Date  . CAD (coronary artery disease)     a. 1997 MI/PCI RCA;  b. 2007 MI/PCI of 100% RCA with Taxus DES x 3 placed, EF 55%;  c. 2010 Cath: stable anatomy;  d. 05/2012 NSTEMI/Cath/PCI: LM nl, LAD 60-43m, D1 20ost, LCX 53m, RCA 40-69m ISR, 60/95d (Treated w/ 2.75x33 Xience Xpedition DES), PDA 44m (Treated w/ PTCA), EF 60%.  . Depression   . Hyperlipidemia   . Hypertension   . Carpal tunnel syndrome on both sides   . Numbness of foot     "never recovered from last back OR, 2009" (05/17/2012)  . GERD (gastroesophageal reflux disease)   . Arthritis     "back; probably knees" (05/17/2012)  . Chronic lower back pain     Past Surgical History  Procedure Date  . Cesarean section 1990  . Back surgery   . Ankle surgery  left; "had to take out a floater" (05/17/2012)  . Posterior fusion lumbar spine 1962; 2009  . Coronary angioplasty with stent placement 1997; 2007    "2 + 2; total of 4"    Family History  Problem Relation Age of Onset  . Coronary artery disease Mother   . Diabetes Mother   . Hypertension Mother   . Dementia Father   . Sudden death Brother     suicide  . Cancer Paternal Grandfather     esophageal    History   Social History  . Marital Status: Married    Spouse Name: N/A    Number of Children: N/A  . Years of Education: N/A   Occupational History  . Not on file.   Social History Main Topics  . Smoking status: Former Smoker -- 0.1 packs/day for 30 years    Types: Cigarettes  . Smokeless tobacco: Never Used  . Alcohol Use: No  . Drug Use: No  . Sexually Active: Not Currently   Other Topics Concern  . Not on file   Social History Narrative  . No narrative on file     Review of Systems:  All systems reviewed.  They are negative to the above problem except as previously stated.  Vital Signs: BP 129/73  Pulse 59  Ht 5\' 3"  (1.6 m)  Wt 163 lb 1.9 oz (73.991 kg)  BMI 28.90 kg/m2  SpO2 96%  Physical Exam Patient is in NAD HEENT:  Normocephalic, atraumatic. EOMI, PERRLA.  Neck: JVP is normal.  No bruits.  Lungs: clear to auscultation. No rales no wheezes.  Heart: Regular rate and rhythm. Normal S1, S2. No S3.   No significant murmurs. PMI not displaced.  Abdomen:  Supple, nontender. Normal bowel sounds. No masses. No hepatomegaly.  Extremities:   Good distal pulses throughout. No lower extremity edema.  Musculoskeletal :moving all extremities.  Neuro:   alert and oriented x3.  CN II-XII grossly intact.   Assessment and Plan:  1.  CAD  Recent intervention.  No symptoms of angina.  Keep on same regimen.  Try to stay active.  F/U at end of summer  2.  HL  Keep on Vytorin

## 2012-07-19 NOTE — Patient Instructions (Signed)
Your physician wants you to follow-up in: August 2014You will receive a reminder letter in the mail two months in advance. If you don't receive a letter, please call our office to schedule the follow-up appointment.  

## 2012-07-20 ENCOUNTER — Other Ambulatory Visit: Payer: Self-pay | Admitting: Internal Medicine

## 2012-09-21 ENCOUNTER — Encounter: Payer: Self-pay | Admitting: Family Medicine

## 2012-09-21 ENCOUNTER — Ambulatory Visit (INDEPENDENT_AMBULATORY_CARE_PROVIDER_SITE_OTHER): Payer: Managed Care, Other (non HMO) | Admitting: Family Medicine

## 2012-09-21 VITALS — BP 120/68 | HR 68 | Temp 97.8°F | Resp 18 | Wt 157.0 lb

## 2012-09-21 DIAGNOSIS — J069 Acute upper respiratory infection, unspecified: Secondary | ICD-10-CM

## 2012-09-21 NOTE — Patient Instructions (Signed)

## 2012-09-21 NOTE — Progress Notes (Signed)
Chief Complaint  Patient presents with  . Cough  . Nasal Congestion    HPI:  Acute visit for sinus congestion: -started: 2-3 days ago -symptoms:nasal congestion, sore throat, cough - yellow mucus, sinus pressure -denies:fever, SOB, NVD, tooth pain, flu exposure -has tried: musinex, saline -sick contacts: grandson, son with colds -Hx of: hx of sinusitis   ROS: See pertinent positives and negatives per HPI.  Past Medical History  Diagnosis Date  . CAD (coronary artery disease)     a. 1997 MI/PCI RCA;  b. 2007 MI/PCI of 100% RCA with Taxus DES x 3 placed, EF 55%;  c. 2010 Cath: stable anatomy;  d. 05/2012 NSTEMI/Cath/PCI: LM nl, LAD 60-30m, D1 20ost, LCX 24m, RCA 40-62m ISR, 60/95d (Treated w/ 2.75x33 Xience Xpedition DES), PDA 58m (Treated w/ PTCA), EF 60%.  . Depression   . Hyperlipidemia   . Hypertension   . Carpal tunnel syndrome on both sides   . Numbness of foot     "never recovered from last back OR, 2009" (05/17/2012)  . GERD (gastroesophageal reflux disease)   . Arthritis     "back; probably knees" (05/17/2012)  . Chronic lower back pain     Family History  Problem Relation Age of Onset  . Coronary artery disease Mother   . Diabetes Mother   . Hypertension Mother   . Dementia Father   . Sudden death Brother     suicide  . Cancer Paternal Grandfather     esophageal    History   Social History  . Marital Status: Married    Spouse Name: N/A    Number of Children: N/A  . Years of Education: N/A   Social History Main Topics  . Smoking status: Former Smoker -- 0.1 packs/day for 30 years    Types: Cigarettes  . Smokeless tobacco: Never Used  . Alcohol Use: No  . Drug Use: No  . Sexually Active: Not Currently   Other Topics Concern  . None   Social History Narrative  . None    Current outpatient prescriptions:aspirin EC 81 MG EC tablet, Take 1 tablet (81 mg total) by mouth daily., Disp: 30 tablet, Rfl: ;  clobetasol (TEMOVATE) 0.05 % external  solution, Apply 1 application topically daily as needed. Skin dryness, Disp: 50 mL, Rfl: 1;  clopidogrel (PLAVIX) 75 MG tablet, Take 1 tablet (75 mg total) by mouth daily., Disp: 30 tablet, Rfl: 6 escitalopram (LEXAPRO) 10 MG tablet, Take 1 tablet (10 mg total) by mouth daily., Disp: 30 tablet, Rfl: 3;  ezetimibe-simvastatin (VYTORIN) 10-80 MG per tablet, Take 1 tablet by mouth at bedtime., Disp: 30 tablet, Rfl: 4;  metoprolol (LOPRESSOR) 50 MG tablet, Take 25 mg by mouth 2 (two) times daily., Disp: , Rfl:  nitroGLYCERIN (NITROSTAT) 0.4 MG SL tablet, Place 1 tablet (0.4 mg total) under the tongue every 5 (five) minutes as needed. Chest pain, Disp: 25 tablet, Rfl: 3;  pantoprazole (PROTONIX) 40 MG tablet, Take 1 tablet (40 mg total) by mouth daily., Disp: 30 tablet, Rfl: 6;  traMADol (ULTRAM) 50 MG tablet, Take 2 tablets (100 mg total) by mouth every 8 (eight) hours as needed for pain. For pain, Disp: 180 tablet, Rfl: 3 zolpidem (AMBIEN) 10 MG tablet, Take 1 tablet (10 mg total) by mouth at bedtime as needed. For sleep, Disp: 30 tablet, Rfl: 3  EXAM:  Filed Vitals:   09/21/12 1419  BP: 120/68  Pulse: 68  Temp: 97.8 F (36.6 C)  Resp: 18  There is no height on file to calculate BMI.  GENERAL: vitals reviewed and listed above, alert, oriented, appears well hydrated and in no acute distress  HEENT: atraumatic, conjunttiva clear, no obvious abnormalities on inspection of external nose and ears, normal appearance of ear canals and TMs, clear nasal congestion, mild post oropharyngeal erythema with PND, no tonsillar edema or exudate, no sinus TTP  NECK: no obvious masses on inspection  LUNGS: clear to auscultation bilaterally, no wheezes, rales or rhonchi, good air movement  CV: HRRR, no peripheral edema  MS: moves all extremities without noticeable abnormality  PSYCH: pleasant and cooperative, no obvious depression or anxiety  ASSESSMENT AND PLAN:  Discussed the following assessment and  plan:  1. Upper respiratory infection    -likely viral - advised supportive care and return precautions -Patient advised to return or notify a doctor immediately if symptoms worsen or persist or new concerns arise.  Patient Instructions  INSTRUCTIONS FOR UPPER RESPIRATORY INFECTION:  -plenty of rest and fluids  -nasal saline wash 2-3 times daily (use prepackaged nasal saline or bottled/distilled water if making your own)   -can use sinex or afrin nasal spray for drainage and nasal congestion - but do NOT use longer then 3-4 days  -can use tylenol or ibuprofen as directed for aches and sorethroat  -in the winter time, using a humidifier at night is helpful (please follow cleaning instructions)  -if you are taking a cough medication - use only as directed, may also try a teaspoon of honey to coat the throat and throat lozenges  -for sore throat, salt water gargles can help  -follow up if you have fevers, facial pain, tooth pain, difficulty breathing or are worsening or not getting better in 5-7 days      KIM, HANNAH R.

## 2012-09-27 ENCOUNTER — Other Ambulatory Visit: Payer: Self-pay | Admitting: Family

## 2012-09-28 ENCOUNTER — Ambulatory Visit: Payer: Managed Care, Other (non HMO) | Admitting: Internal Medicine

## 2012-10-11 ENCOUNTER — Other Ambulatory Visit: Payer: Self-pay | Admitting: Family

## 2012-10-12 ENCOUNTER — Other Ambulatory Visit: Payer: Self-pay | Admitting: Family

## 2012-11-09 ENCOUNTER — Ambulatory Visit: Payer: Managed Care, Other (non HMO) | Admitting: Internal Medicine

## 2012-11-18 ENCOUNTER — Other Ambulatory Visit: Payer: Self-pay | Admitting: *Deleted

## 2012-11-18 MED ORDER — METOPROLOL TARTRATE 50 MG PO TABS: 25.0000 mg | ORAL_TABLET | Freq: Two times a day (BID) | ORAL | Status: DC

## 2012-11-18 MED ORDER — ESCITALOPRAM OXALATE 10 MG PO TABS: ORAL_TABLET | ORAL | Status: DC

## 2012-11-18 MED ORDER — TRAMADOL HCL 50 MG PO TABS: ORAL_TABLET | ORAL | Status: DC

## 2012-11-18 MED ORDER — EZETIMIBE-SIMVASTATIN 10-80 MG PO TABS: 1.0000 | ORAL_TABLET | Freq: Every day | ORAL | Status: DC

## 2012-11-23 ENCOUNTER — Other Ambulatory Visit: Payer: Self-pay | Admitting: *Deleted

## 2012-11-23 MED ORDER — PANTOPRAZOLE SODIUM 40 MG PO TBEC: 40.0000 mg | DELAYED_RELEASE_TABLET | Freq: Every day | ORAL | Status: DC

## 2012-12-10 ENCOUNTER — Other Ambulatory Visit (HOSPITAL_COMMUNITY): Payer: Self-pay | Admitting: Nurse Practitioner

## 2012-12-20 ENCOUNTER — Other Ambulatory Visit: Payer: Self-pay | Admitting: Internal Medicine

## 2013-01-02 ENCOUNTER — Other Ambulatory Visit (HOSPITAL_COMMUNITY): Payer: Self-pay | Admitting: Nurse Practitioner

## 2013-01-04 ENCOUNTER — Ambulatory Visit (INDEPENDENT_AMBULATORY_CARE_PROVIDER_SITE_OTHER): Payer: Managed Care, Other (non HMO) | Admitting: Internal Medicine

## 2013-01-04 ENCOUNTER — Encounter: Payer: Self-pay | Admitting: Internal Medicine

## 2013-01-04 VITALS — BP 116/64 | HR 60 | Temp 98.0°F | Wt 151.0 lb

## 2013-01-04 DIAGNOSIS — R350 Frequency of micturition: Secondary | ICD-10-CM

## 2013-01-04 DIAGNOSIS — I2510 Atherosclerotic heart disease of native coronary artery without angina pectoris: Secondary | ICD-10-CM

## 2013-01-04 DIAGNOSIS — R739 Hyperglycemia, unspecified: Secondary | ICD-10-CM

## 2013-01-04 DIAGNOSIS — I10 Essential (primary) hypertension: Secondary | ICD-10-CM

## 2013-01-04 DIAGNOSIS — K219 Gastro-esophageal reflux disease without esophagitis: Secondary | ICD-10-CM

## 2013-01-04 DIAGNOSIS — E785 Hyperlipidemia, unspecified: Secondary | ICD-10-CM

## 2013-01-04 DIAGNOSIS — R7309 Other abnormal glucose: Secondary | ICD-10-CM

## 2013-01-04 DIAGNOSIS — I214 Non-ST elevation (NSTEMI) myocardial infarction: Secondary | ICD-10-CM

## 2013-01-04 LAB — HEPATIC FUNCTION PANEL
Alkaline Phosphatase: 69 U/L (ref 39–117)
Bilirubin, Direct: 0 mg/dL (ref 0.0–0.3)
Total Bilirubin: 0.7 mg/dL (ref 0.3–1.2)

## 2013-01-04 LAB — POCT URINALYSIS DIPSTICK
Glucose, UA: NEGATIVE
Leukocytes, UA: NEGATIVE
Nitrite, UA: NEGATIVE
Urobilinogen, UA: 0.2

## 2013-01-04 LAB — CBC WITH DIFFERENTIAL/PLATELET
Basophils Absolute: 0 10*3/uL (ref 0.0–0.1)
Eosinophils Absolute: 0.1 10*3/uL (ref 0.0–0.7)
Lymphocytes Relative: 27.2 % (ref 12.0–46.0)
MCHC: 34.9 g/dL (ref 30.0–36.0)
Neutrophils Relative %: 61.9 % (ref 43.0–77.0)
Platelets: 232 10*3/uL (ref 150.0–400.0)
RDW: 13.2 % (ref 11.5–14.6)

## 2013-01-04 LAB — LIPID PANEL
Cholesterol: 140 mg/dL (ref 0–200)
LDL Cholesterol: 68 mg/dL (ref 0–99)
Total CHOL/HDL Ratio: 3
Triglycerides: 145 mg/dL (ref 0.0–149.0)
VLDL: 29 mg/dL (ref 0.0–40.0)

## 2013-01-04 LAB — BASIC METABOLIC PANEL
Calcium: 9.3 mg/dL (ref 8.4–10.5)
Chloride: 106 mEq/L (ref 96–112)
Creatinine, Ser: 0.7 mg/dL (ref 0.4–1.2)
Sodium: 139 mEq/L (ref 135–145)

## 2013-01-04 NOTE — Assessment & Plan Note (Signed)
Adequate control. Continue current medications. 

## 2013-01-04 NOTE — Assessment & Plan Note (Addendum)
Lipid Panel     Component Value Date/Time   CHOL 160 05/18/2012 0600   TRIG 220* 05/18/2012 0600   HDL 44 05/18/2012 0600   CHOLHDL 3.6 05/18/2012 0600   VLDL 44* 05/18/2012 0600   LDLCALC 72 05/18/2012 0600   Labs were fairly well controlled in 2013. I will  check again today.

## 2013-01-04 NOTE — Progress Notes (Signed)
Patient comes in for followup. Patient has a long history of coronary artery disease. This diagnosis goes back to 1997. She had a non-ST elevated myocardial infarction in the fall of 2013. She has done well since that time. I've reviewed Dr. Tenny Craw' previous note. Patient denies any chest pain, shortness of breath, PND, orthopnea.  Patient has history of hypertension she is tolerating medications without difficulty.  Patient has hyperlipidemia and is tolerating Vytorin without difficulty.  Reviewed past medical history, family history, social history. Reviewed medications.  Review of systems: Patient denies chest pain, shortness breath, PND, orthopnea. No other specific complaints in a complete review of systems.  Physical exam: reviewed vitals.  Patient appears older than her stated age of 89. No acute distress. HEENT exam atraumatic, normocephalic symmetric her muscles are intact. Neck is supple. Chest clear to auscultation cardiac exam S1-S2 are regular. Abdominal exam active bowel sounds, soft her extremities no edema. Neurologic exam she is alert and oriented.

## 2013-01-06 ENCOUNTER — Other Ambulatory Visit (HOSPITAL_COMMUNITY): Payer: Self-pay | Admitting: Nurse Practitioner

## 2013-01-06 NOTE — Assessment & Plan Note (Signed)
No current sxs She has refused to quit smoking

## 2013-01-06 NOTE — Assessment & Plan Note (Signed)
No sx's 

## 2013-01-27 ENCOUNTER — Telehealth: Payer: Self-pay | Admitting: Internal Medicine

## 2013-01-27 MED ORDER — ESCITALOPRAM OXALATE 10 MG PO TABS: ORAL_TABLET | ORAL | Status: DC

## 2013-01-27 NOTE — Telephone Encounter (Signed)
Pt would a refill on escitalopram 10 mg #90 call into [facility] [address] [phone]. Pt is out

## 2013-01-27 NOTE — Telephone Encounter (Signed)
Sent to pharmacy 

## 2013-01-31 ENCOUNTER — Telehealth: Payer: Self-pay | Admitting: Internal Medicine

## 2013-01-31 NOTE — Telephone Encounter (Signed)
Pt called to request a 3 month supply of ezetimibe-simvastatin (VYTORIN) 10-80 MG per tablet, she would like [facility] to [facility] [address]. Please assist.

## 2013-02-01 ENCOUNTER — Other Ambulatory Visit: Payer: Self-pay | Admitting: Internal Medicine

## 2013-02-03 ENCOUNTER — Other Ambulatory Visit: Payer: Self-pay | Admitting: Internal Medicine

## 2013-02-03 MED ORDER — EZETIMIBE-SIMVASTATIN 10-80 MG PO TABS: 1.0000 | ORAL_TABLET | Freq: Every day | ORAL | Status: DC

## 2013-02-03 NOTE — Telephone Encounter (Signed)
Pt is out of med. Pt is aware MD out of office today

## 2013-02-03 NOTE — Telephone Encounter (Signed)
Pt needs refill of ezetimibe-simvastatin (VYTORIN) 10-80 MG per tablet.( 1 X day)  (90 tablets)  Pt had her OV on 5/20.  Dr Cato Mulligan told her to call when she needed RX. Also needs traMADol (ULTRAM) 50 MG tablet 1 every 4 hours (180 tablets) Pt [facility] ph[facility]acy [facility] [address] Pt is out of the tramadol and needs asap!! Thanks!  FYI : This message was routed to Dr Lovell [facility]ehan [facility] (?)

## 2013-02-03 NOTE — Telephone Encounter (Signed)
rx sent in electronically 

## 2013-02-03 NOTE — Telephone Encounter (Signed)
Pt aware/kh 

## 2013-02-21 ENCOUNTER — Encounter: Payer: Self-pay | Admitting: Family

## 2013-02-21 ENCOUNTER — Ambulatory Visit (INDEPENDENT_AMBULATORY_CARE_PROVIDER_SITE_OTHER): Payer: Managed Care, Other (non HMO) | Admitting: Family

## 2013-02-21 VITALS — BP 112/64 | HR 57 | Wt 150.0 lb

## 2013-02-21 DIAGNOSIS — M545 Low back pain: Secondary | ICD-10-CM

## 2013-02-21 DIAGNOSIS — T148XXA Other injury of unspecified body region, initial encounter: Secondary | ICD-10-CM

## 2013-02-21 MED ORDER — HYDROCODONE-ACETAMINOPHEN 10-325 MG PO TABS: 0.5000 | ORAL_TABLET | Freq: Three times a day (TID) | ORAL | Status: DC | PRN

## 2013-02-21 MED ORDER — CYCLOBENZAPRINE HCL 10 MG PO TABS: 10.0000 mg | ORAL_TABLET | Freq: Three times a day (TID) | ORAL | Status: DC | PRN

## 2013-02-21 NOTE — Patient Instructions (Addendum)
Muscle Strain  Muscle strain occurs when a muscle is stretched beyond its normal length. A small number of muscle fibers generally are torn. This is especially common in athletes. This happens when a sudden, violent force placed on a muscle stretches it too far. Usually, recovery from muscle strain takes 1 to 2 weeks. Complete healing will take 5 to 6 weeks.   HOME CARE INSTRUCTIONS    While awake, apply ice to the sore muscle for the first 2 days after the injury.   Put ice in a plastic bag.   Place a towel between your skin and the bag.   Leave the ice on for 15-20 minutes each hour.   Do not use the strained muscle for several days, until you no longer have pain.   You may wrap the injured area with an elastic bandage for comfort. Be careful not to wrap it too tightly. This may interfere with blood circulation or increase swelling.   Only take over-the-counter or prescription medicines for pain, discomfort, or fever as directed by your caregiver.  SEEK MEDICAL CARE IF:   You have increasing pain or swelling in the injured area.  MAKE SURE YOU:    Understand these instructions.   Will watch your condition.   Will get help right away if you are not doing well or get worse.  Document Released: 08/04/2005 Document Revised: 10/27/2011 Document Reviewed: 08/16/2011  [employer] Patient Information 2014 [employer].

## 2013-02-21 NOTE — Progress Notes (Signed)
Subjective:    Patient ID: Bianca [name], female    DOB: 10-22-1945, 67 y.o.   MRN: [id number]  HPI  67 year old white female, patient of Dr. Cato Mulligan is in today with complaints of left lower back pain x3 days. Reports her pain appears to be improving. Rates the pain as 7/10, worse with sitting for extended periods of time. Better with standing. Has been taking tramadol but has not helped her symptoms.  Review of Systems  Constitutional: Negative.   Respiratory: Negative.   Cardiovascular: Negative.   Genitourinary: Negative.   Musculoskeletal: Positive for back pain and arthralgias.  Skin: Negative.   Neurological: Negative.   Hematological: Negative.   Psychiatric/Behavioral: Negative.    Past Medical History  Diagnosis Date  . CAD (coronary artery disease)     a. 1997 MI/PCI RCA;  b. 2007 MI/PCI of 100% RCA with Taxus DES x 3 placed, EF 55%;  c. 2010 Cath: stable anatomy;  d. 05/2012 NSTEMI/Cath/PCI: LM nl, LAD 60-92m, D1 20ost, LCX 55m, RCA 40-58m ISR, 60/95d (Treated w/ 2.75x33 Xience Xpedition DES), PDA 2m (Treated w/ PTCA), EF 60%.  . Depression   . Hyperlipidemia   . Hypertension   . Carpal tunnel syndrome on both sides   . Numbness of foot     "never recovered from last back OR, 2009" (05/17/2012)  . GERD (gastroesophageal reflux disease)   . Arthritis     "back; probably knees" (05/17/2012)  . Chronic lower back pain     History   Social History  . Marital Status: Married    Spouse Name: N/A    Number of Children: N/A  . Years of Education: N/A   Occupational History  . Not on file.   Social History Main Topics  . Smoking status: Current Every Day Smoker -- 0.50 packs/day for 50 years    Types: Cigarettes  . Smokeless tobacco: Never Used  . Alcohol Use: No  . Drug Use: No  . Sexually Active: Not Currently   Other Topics Concern  . Not on file   Social History Narrative  . No narrative on file    Past Surgical History  Procedure Laterality Date  .  Cesarean section  1990  . Back surgery    . Ankle surgery      left; "had to take out a floater" (05/17/2012)  . Posterior fusion lumbar spine  1962; 2009  . Coronary angioplasty with stent placement  1997; 2007    "2 + 2; total of 4"    Family History  Problem Relation Age of Onset  . Coronary artery disease Mother   . Diabetes Mother   . Hypertension Mother   . Dementia Father   . Sudden death Brother     suicide  . Cancer Paternal Grandfather     esophageal    Allergies  Allergen Reactions  . Oxycodone Hcl Itching and Other (See Comments)    confusion  . Penicillins Rash    Current Outpatient Prescriptions on File Prior to Visit  Medication Sig Dispense Refill  . aspirin EC 81 MG EC tablet Take 1 tablet (81 mg total) by mouth daily.  30 tablet    . clobetasol (TEMOVATE) 0.05 % external solution Apply 1 application topically daily as needed. Skin dryness  50 mL  1  . clopidogrel (PLAVIX) 75 MG tablet TAKE 1 TABLET (75 MG TOTAL) BY MOUTH DAILY.  30 tablet  6  . escitalopram (LEXAPRO) 10 MG tablet TAKE 1 TABLET  BY MOUTH EVERY DAY  90 tablet  1  . ESTRACE VAGINAL 0.1 MG/GM vaginal cream daily.      Marland Kitchen ezetimibe-simvastatin (VYTORIN) 10-80 MG per tablet Take 1 tablet by mouth at bedtime.  90 tablet  3  . fluticasone (FLONASE) 50 MCG/ACT nasal spray Place 1 spray into the nose daily.      . metoprolol (LOPRESSOR) 50 MG tablet TAKE 1/2 TABLETS (25 MG TOTAL) BY MOUTH 2 (TWO) TIMES DAILY.  60 tablet  0  . nitroGLYCERIN (NITROSTAT) 0.4 MG SL tablet Place 1 tablet (0.4 mg total) under the tongue every 5 (five) minutes as needed. Chest pain  25 tablet  3  . pantoprazole (PROTONIX) 40 MG tablet Take 1 tablet (40 mg total) by mouth daily.  90 tablet  1  . pantoprazole (PROTONIX) 40 MG tablet TAKE 1 TABLET (40 MG TOTAL) BY MOUTH DAILY.  30 tablet  4  . traMADol (ULTRAM) 50 MG tablet TAKE 2 TABLETS EVERY 8 HOURS AS NEEDED FOR PAIN  180 tablet  3  . zolpidem (AMBIEN) 10 MG tablet TAKE 1  TABLET AT BEDTIME AS NEEDED FOR SLEEP  30 tablet  3   No current facility-administered medications on file prior to visit.    BP 112/64  Pulse 57  Wt 150 lb (68.04 kg)  BMI 26.58 kg/m2  SpO2 98%chart    Objective:   Physical Exam  Constitutional: She is oriented to person, place, and time. She appears well-developed and well-nourished.  Neck: Normal range of motion. Neck supple.  Cardiovascular: Normal rate, regular rhythm and normal heart sounds.   Pulmonary/Chest: Effort normal.  Abdominal: Soft. Bowel sounds are normal.  Musculoskeletal: She exhibits no edema and no tenderness.  Tenderness to palpation of the left lower back. Pain with rotation. Mild pain with flexion. Negative straight leg raise.  Neurological: She is alert and oriented to person, place, and time. She has normal reflexes. She displays normal reflexes. No cranial nerve deficit. Coordination normal.  Skin: Skin is warm and dry.  Psychiatric: She has a normal mood and affect.          Assessment & Plan:  Assessment:   1. Low back pain 2. Muscle strain  Plan: Flexeril 10 mg one tablet 3 times a day as needed. Warned of drowsiness. Hydrocodone as needed for pain. Warned of drowsiness. Patient call the office if symptoms worsen or persist. Recheck a schedule, and as needed.

## 2013-04-08 ENCOUNTER — Other Ambulatory Visit: Payer: Self-pay | Admitting: Internal Medicine

## 2013-05-26 ENCOUNTER — Other Ambulatory Visit: Payer: Self-pay | Admitting: Internal Medicine

## 2013-05-26 NOTE — Telephone Encounter (Signed)
Pt is out of med. Pt is going out of town tomorrow

## 2013-06-07 ENCOUNTER — Other Ambulatory Visit (HOSPITAL_COMMUNITY): Payer: Self-pay | Admitting: Internal Medicine

## 2013-07-07 ENCOUNTER — Ambulatory Visit (INDEPENDENT_AMBULATORY_CARE_PROVIDER_SITE_OTHER): Payer: Managed Care, Other (non HMO) | Admitting: Internal Medicine

## 2013-07-07 ENCOUNTER — Encounter: Payer: Self-pay | Admitting: Internal Medicine

## 2013-07-07 VITALS — BP 118/70 | HR 55 | Ht 62.5 in | Wt 154.0 lb

## 2013-07-07 DIAGNOSIS — I2510 Atherosclerotic heart disease of native coronary artery without angina pectoris: Secondary | ICD-10-CM

## 2013-07-07 NOTE — Progress Notes (Signed)
HPI Bianca [name] is a 67 y.o. female with a history of CAD, s/p MI in 1997 treated with PCI to the RCA, s/p Taxus DES x3 the RCA in 2007 in the setting of MI, HTN, HL. Echo 7/10: EF 55-60%, mild MR. She was recently admitted 9/30-10/2/13 with a non-STEMI. Patient presented with chest pain radiating to her jaw. Peak troponin was 0.76. LHC 05/18/12: mLAD 60-70%, oD1 20%, mCFX 50%, proximal to distal RCA with multiple overlapping stents, mid 40-50% ISR, distal 60/95%, mPDA 80%, EF 60%. PCI: Xience expedition DES to the RCA and balloon angioplasty to the PDA. It was noted that if the patient develops further restenosis in the RCA, best treatment option would probably CABG.  Since , I saw the patient last year she denies CP  Breathing is OK   Being evaluate for R hip surgery.    Allergies  Allergen Reactions  . Oxycodone Hcl Itching and Other (See Comments)    confusion  . Penicillins Rash    Current Outpatient Prescriptions  Medication Sig Dispense Refill  . clobetasol (TEMOVATE) 0.05 % external solution Apply 1 application topically daily as needed. Skin dryness  50 mL  1  . clopidogrel (PLAVIX) 75 MG tablet TAKE 1 TABLET (75 MG TOTAL) BY MOUTH DAILY.  30 tablet  6  . cyclobenzaprine (FLEXERIL) 10 MG tablet Take 1 tablet (10 mg total) by mouth 3 (three) times daily as needed for muscle spasms.  15 tablet  0  . escitalopram (LEXAPRO) 10 MG tablet TAKE 1 TABLET BY MOUTH EVERY DAY  90 tablet  1  . ESTRACE VAGINAL 0.1 MG/GM vaginal cream daily.      Marland Kitchen ezetimibe-simvastatin (VYTORIN) 10-80 MG per tablet Take 1 tablet by mouth at bedtime.  90 tablet  3  . fluticasone (FLONASE) 50 MCG/ACT nasal spray Place 1 spray into the nose daily.      . metoprolol (LOPRESSOR) 50 MG tablet TAKE 1/2 TABLETS (25 MG TOTAL) BY MOUTH 2 (TWO) TIMES DAILY.  60 tablet  0  . nitroGLYCERIN (NITROSTAT) 0.4 MG SL tablet Place 1 tablet (0.4 mg total) under the tongue every 5 (five) minutes as needed. Chest pain  25 tablet  3  .  pantoprazole (PROTONIX) 40 MG tablet TAKE 1 TABLET (40 MG TOTAL) BY MOUTH DAILY.  30 tablet  0  . traMADol (ULTRAM) 50 MG tablet TAKE 2 TABLETS EVERY 8 HOURS AS NEEDED FOR PAIN  180 tablet  2  . zolpidem (AMBIEN) 10 MG tablet TAKE 1 TABLET AT BEDTIME AS NEEDED FOR SLEEP  30 tablet  3   No current facility-administered medications for this visit.    Past Medical History  Diagnosis Date  . CAD (coronary artery disease)     a. 1997 MI/PCI RCA;  b. 2007 MI/PCI of 100% RCA with Taxus DES x 3 placed, EF 55%;  c. 2010 Cath: stable anatomy;  d. 05/2012 NSTEMI/Cath/PCI: LM nl, LAD 60-81m, D1 20ost, LCX 20m, RCA 40-57m ISR, 60/95d (Treated w/ 2.75x33 Xience Xpedition DES), PDA 31m (Treated w/ PTCA), EF 60%.  . Depression   . Hyperlipidemia   . Hypertension   . Carpal tunnel syndrome on both sides   . Numbness of foot     "never recovered from last back OR, 2009" (05/17/2012)  . GERD (gastroesophageal reflux disease)   . Arthritis     "back; probably knees" (05/17/2012)  . Chronic lower back pain     Past Surgical History  Procedure Laterality Date  .  Cesarean section  1990  . Back surgery    . Ankle surgery      left; "had to take out a floater" (05/17/2012)  . Posterior fusion lumbar spine  1962; 2009  . Coronary angioplasty with stent placement  1997; 2007    "2 + 2; total of 4"    Family History  Problem Relation Age of Onset  . Coronary artery disease Mother   . Diabetes Mother   . Hypertension Mother   . Dementia Father   . Sudden death Brother     suicide  . Cancer Paternal Grandfather     esophageal    History   Social History  . Marital Status: Married    Spouse Name: N/A    Number of Children: N/A  . Years of Education: N/A   Occupational History  . Not on file.   Social History Main Topics  . Smoking status: Current Every Day Smoker -- 0.50 packs/day for 50 years    Types: Cigarettes  . Smokeless tobacco: Never Used  . Alcohol Use: No  . Drug Use: No  .  Sexual Activity: Not Currently   Other Topics Concern  . Not on file   Social History Narrative  . No narrative on file    Review of Systems:  All systems reviewed.  They are negative to the above problem except as previously stated.  Vital Signs: BP 118/70  Pulse 55  Ht 5' 2.5" (1.588 m)  Wt 154 lb (69.854 kg)  BMI 27.70 kg/m2  Physical Exam Patient is in NAD HEENT:  Normocephalic, atraumatic. EOMI, PERRLA.  Neck: JVP is normal.  No bruits.  Lungs: clear to auscultation. No rales no wheezes.  Heart: Regular rate and rhythm. Normal S1, S2. No S3.   No significant murmurs. PMI not displaced.  Abdomen:  Supple, nontender. Normal bowel sounds. No masses. No hepatomegaly.  Extremities:   Good distal pulses throughout. No lower extremity edema.  Musculoskeletal :moving all extremities.  Neuro:   alert and oriented x3.  CN II-XII grossly intact.  EKG  SB 55 bpm   Assessment and Plan:  1.  CAD INtervention last Sept.  NO CP  Does get a little winded with stairs but thinks deconditioned because of hip   She is off ASA since sometime earlier this year. I would recommm that she resume this.   From a cardiac standpoint she is at low risk for major cardiac event and OK to proceed with surgery without further testing.   When she does have surgery she can stop the plavix and resume after.  Keep on ASA.  2.  HL  Keep on Vytorin  Lipids very good in May     F/u in 1 year

## 2013-07-07 NOTE — Patient Instructions (Signed)
Your physician wants you to follow-up in:  12 months.  You will receive a reminder letter in the mail two months in advance. If you don't receive a letter, please call our office to schedule the follow-up appointment.   

## 2013-07-12 ENCOUNTER — Other Ambulatory Visit (HOSPITAL_COMMUNITY): Payer: Self-pay | Admitting: Internal Medicine

## 2013-07-25 ENCOUNTER — Other Ambulatory Visit: Payer: Self-pay | Admitting: Internal Medicine

## 2013-07-26 ENCOUNTER — Other Ambulatory Visit: Payer: Self-pay | Admitting: Internal Medicine

## 2013-07-30 ENCOUNTER — Other Ambulatory Visit: Payer: Self-pay | Admitting: Internal Medicine

## 2013-08-23 ENCOUNTER — Encounter: Payer: Self-pay | Admitting: Family

## 2013-08-23 ENCOUNTER — Ambulatory Visit (INDEPENDENT_AMBULATORY_CARE_PROVIDER_SITE_OTHER): Payer: Managed Care, Other (non HMO) | Admitting: Family

## 2013-08-23 VITALS — BP 106/70 | HR 55 | Wt 156.0 lb

## 2013-08-23 DIAGNOSIS — E7800 Pure hypercholesterolemia, unspecified: Secondary | ICD-10-CM

## 2013-08-23 DIAGNOSIS — J019 Acute sinusitis, unspecified: Secondary | ICD-10-CM

## 2013-08-23 MED ORDER — DOXYCYCLINE HYCLATE 100 MG PO TABS: 100.0000 mg | ORAL_TABLET | Freq: Two times a day (BID) | ORAL | Status: DC

## 2013-08-23 MED ORDER — AZITHROMYCIN 250 MG PO TABS: 250.0000 mg | ORAL_TABLET | Freq: Every day | ORAL | Status: DC

## 2013-08-23 MED ORDER — SIMVASTATIN 20 MG PO TABS: 20.0000 mg | ORAL_TABLET | Freq: Every day | ORAL | Status: DC

## 2013-08-23 NOTE — Progress Notes (Signed)
Pre visit review using our clinic review tool, if applicable. No additional management support is needed unless otherwise documented below in the visit note. 

## 2013-08-23 NOTE — Patient Instructions (Signed)

## 2013-08-23 NOTE — Progress Notes (Signed)
Subjective:    Patient ID: Bianca [name], female    DOB: April 21, 1946, 68 y.o.   MRN: [id number] HPI  68 year old caucasian female, smoker presenting for a nasal congestion sinus pressure, neck pain,  and nausea.  Symptoms began on December 1.  [facility] and was [facility] Doxycycline at that time.  She finished the antibiotics but symptoms returned one week later.  Sinus pressure, nasal congestion, and neck pain has gotten worst.  It wakes her up at night. Nothing seems to help.  Patient reports her insurance company not covering Vytorin anymore. She is requesting a different agent.   Review of Systems  Constitutional: Negative.   HENT: Positive for sinus pressure.        Nasal congestion and sinus pressure  Eyes: Negative.   Respiratory: Negative.   Cardiovascular: Negative.   Gastrointestinal: Negative.   Endocrine: Negative.   Genitourinary: Negative.   Musculoskeletal: Positive for neck pain.       Neck pain  Skin: Negative.   Allergic/Immunologic: Negative.   Neurological: Negative.   Hematological: Negative.   Psychiatric/Behavioral: Negative.    Past Medical History  Diagnosis Date  . CAD (coronary artery disease)     a. 1997 MI/PCI RCA;  b. 2007 MI/PCI of 100% RCA with Taxus DES x 3 placed, EF 55%;  c. 2010 Cath: stable anatomy;  d. 05/2012 NSTEMI/Cath/PCI: LM nl, LAD 60-10m, D1 20ost, LCX 29m, RCA 40-53m ISR, 60/95d (Treated w/ 2.75x33 Xience Xpedition DES), PDA 57m (Treated w/ PTCA), EF 60%.  . Depression   . Hyperlipidemia   . Hypertension   . Carpal tunnel syndrome on both sides   . Numbness of foot     "never recovered from last back OR, 2009" (05/17/2012)  . GERD (gastroesophageal reflux disease)   . Arthritis     "back; probably knees" (05/17/2012)  . Chronic lower back pain     History   Social History  . Marital Status: Married    Spouse Name: N/A    Number of Children: N/A  . Years of Education: N/A   Occupational History  . Not  on file.   Social History Main Topics  . Smoking status: Current Every Day Smoker -- 0.50 packs/day for 50 years    Types: Cigarettes  . Smokeless tobacco: Never Used  . Alcohol Use: No  . Drug Use: No  . Sexual Activity: Not Currently   Other Topics Concern  . Not on file   Social History Narrative  . No narrative on file    Past Surgical History  Procedure Laterality Date  . Cesarean section  1990  . Back surgery    . Ankle surgery      left; "had to take out a floater" (05/17/2012)  . Posterior fusion lumbar spine  1962; 2009  . Coronary angioplasty with stent placement  1997; 2007    "2 + 2; total of 4"    Family History  Problem Relation Age of Onset  . Coronary artery disease Mother   . Diabetes Mother   . Hypertension Mother   . Dementia Father   . Sudden death Brother     suicide  . Cancer Paternal Grandfather     esophageal    Allergies  Allergen Reactions  . Oxycodone Hcl Itching and Other (See Comments)    confusion  . Penicillins Rash    Current Outpatient Prescriptions on File Prior to Visit  Medication Sig Dispense  Refill  . clobetasol (TEMOVATE) 0.05 % external solution Apply 1 application topically daily as needed. Skin dryness  50 mL  1  . clopidogrel (PLAVIX) 75 MG tablet TAKE 1 TABLET (75 MG TOTAL) BY MOUTH DAILY.  30 tablet  11  . cyclobenzaprine (FLEXERIL) 10 MG tablet Take 1 tablet (10 mg total) by mouth 3 (three) times daily as needed for muscle spasms.  15 tablet  0  . escitalopram (LEXAPRO) 10 MG tablet TAKE 1 TABLET BY MOUTH EVERY DAY  90 tablet  1  . ESTRACE VAGINAL 0.1 MG/GM vaginal cream daily.      Marland Kitchen ezetimibe-simvastatin (VYTORIN) 10-80 MG per tablet Take 1 tablet by mouth at bedtime.  90 tablet  3  . fluticasone (FLONASE) 50 MCG/ACT nasal spray Place 1 spray into the nose daily.      . metoprolol (LOPRESSOR) 50 MG tablet TAKE 1/2 TABLETS (25 MG TOTAL) BY MOUTH 2 (TWO) TIMES DAILY.  60 tablet  0  . nitroGLYCERIN (NITROSTAT) 0.4 MG  SL tablet Place 1 tablet (0.4 mg total) under the tongue every 5 (five) minutes as needed. Chest pain  25 tablet  3  . pantoprazole (PROTONIX) 40 MG tablet TAKE 1 TABLET (40 MG TOTAL) BY MOUTH DAILY.  30 tablet  11  . traMADol (ULTRAM) 50 MG tablet TAKE 2 TABLETS EVERY 8 HOURS AS NEEDED FOR PAIN  180 tablet  1  . zolpidem (AMBIEN) 10 MG tablet TAKE 1 TABLET AT BEDTIME AS NEEDED FOR SLEEP  30 tablet  3   No current facility-administered medications on file prior to visit.    BP 106/70  Pulse 55  Wt 156 lb (70.761 kg)chart    Objective:   Physical Exam  Constitutional: She is oriented to person, place, and time. She appears well-developed and well-nourished.  HENT:  Head: Normocephalic.  Right Ear: External ear normal.  Left Ear: External ear normal.  Frontal and mastoid sinuses tender on palpation.  Nasal congestion present  Neck: Normal range of motion.  Cardiovascular: Normal rate, regular rhythm and normal heart sounds.   Pulmonary/Chest: Effort normal and breath sounds normal.  Musculoskeletal: Normal range of motion.  Neck pain present  Neurological: She is alert and oriented to person, place, and time.  Skin: Skin is warm and dry.          Assessment & Plan:  Assessment 1. Sinusitis 2. Neck pain 3. Hyperlipidemia  Plan Zithromax 500 mg x 1 dose, 250 mg x 4 days.  Medrol Pak 21 scored tabs.  Change Vytorin to Simvastatin 20 mg insurance company. Continue current medications.   Schedule appointment for cholesterol check.  Drink plenty of fluids and practice good handwashing technique. Contact office for any questions or concerns.

## 2013-09-15 ENCOUNTER — Other Ambulatory Visit: Payer: Self-pay | Admitting: Internal Medicine

## 2013-09-20 ENCOUNTER — Ambulatory Visit (INDEPENDENT_AMBULATORY_CARE_PROVIDER_SITE_OTHER): Payer: Managed Care, Other (non HMO) | Admitting: Family

## 2013-09-20 ENCOUNTER — Encounter: Payer: Self-pay | Admitting: Family

## 2013-09-20 VITALS — BP 124/78 | HR 62 | Wt 160.0 lb

## 2013-09-20 DIAGNOSIS — I10 Essential (primary) hypertension: Secondary | ICD-10-CM

## 2013-09-20 DIAGNOSIS — N390 Urinary tract infection, site not specified: Secondary | ICD-10-CM

## 2013-09-20 DIAGNOSIS — R300 Dysuria: Secondary | ICD-10-CM

## 2013-09-20 LAB — POCT URINALYSIS DIPSTICK
BILIRUBIN UA: NEGATIVE
Glucose, UA: NEGATIVE
Ketones, UA: NEGATIVE
NITRITE UA: NEGATIVE
Protein, UA: NEGATIVE
Spec Grav, UA: 1.015
Urobilinogen, UA: NEGATIVE
pH, UA: 5.5

## 2013-09-20 MED ORDER — METOPROLOL TARTRATE 50 MG PO TABS: ORAL_TABLET | ORAL | Status: DC

## 2013-09-20 MED ORDER — NITROFURANTOIN MONOHYD MACRO 100 MG PO CAPS: 100.0000 mg | ORAL_CAPSULE | Freq: Two times a day (BID) | ORAL | Status: DC

## 2013-09-20 MED ORDER — SIMVASTATIN 80 MG PO TABS: 80.0000 mg | ORAL_TABLET | Freq: Every day | ORAL | Status: DC

## 2013-09-20 NOTE — Progress Notes (Signed)
Pre visit review using our clinic review tool, if applicable. No additional management support is needed unless otherwise documented below in the visit note. 

## 2013-09-20 NOTE — Progress Notes (Signed)
Subjective:    Patient ID: Bianca [name], female    DOB: Jan 22, 1946, 68 y.o.   MRN: [id number]  HPI 68 year old white female, nonsmoker, patient of Dr. sources in today with complaints of vaginal burning x5 days with frequency and urgency. Denies any vaginal discharge or odor. Denies any back pain or abdominal pain. Has not taken any medication for relief. She's also requesting a refill on Lopressor her hypertension. Tolerating the medication well.   Review of Systems  Constitutional: Negative.   Respiratory: Negative.   Cardiovascular: Negative.   Gastrointestinal: Negative.   Genitourinary: Positive for dysuria and urgency. Negative for hematuria, decreased urine volume, vaginal bleeding, vaginal discharge, vaginal pain and menstrual problem.  Musculoskeletal: Negative.   Skin: Negative.   Neurological: Negative.   Psychiatric/Behavioral: Negative.    Past Medical History  Diagnosis Date  . CAD (coronary artery disease)     a. 1997 MI/PCI RCA;  b. 2007 MI/PCI of 100% RCA with Taxus DES x 3 placed, EF 55%;  c. 2010 Cath: stable anatomy;  d. 05/2012 NSTEMI/Cath/PCI: LM nl, LAD 60-58m, D1 20ost, LCX 57m, RCA 40-51m ISR, 60/95d (Treated w/ 2.75x33 Xience Xpedition DES), PDA 52m (Treated w/ PTCA), EF 60%.  . Depression   . Hyperlipidemia   . Hypertension   . Carpal tunnel syndrome on both sides   . Numbness of foot     "never recovered from last back OR, 2009" (05/17/2012)  . GERD (gastroesophageal reflux disease)   . Arthritis     "back; probably knees" (05/17/2012)  . Chronic lower back pain     History   Social History  . Marital Status: Married    Spouse Name: N/A    Number of Children: N/A  . Years of Education: N/A   Occupational History  . Not on file.   Social History Main Topics  . Smoking status: Current Every Day Smoker -- 0.50 packs/day for 50 years    Types: Cigarettes  . Smokeless tobacco: Never Used  . Alcohol Use: No  . Drug Use: No  . Sexual Activity:  Not Currently   Other Topics Concern  . Not on file   Social History Narrative  . No narrative on file    Past Surgical History  Procedure Laterality Date  . Cesarean section  1990  . Back surgery    . Ankle surgery      left; "had to take out a floater" (05/17/2012)  . Posterior fusion lumbar spine  1962; 2009  . Coronary angioplasty with stent placement  1997; 2007    "2 + 2; total of 4"    Family History  Problem Relation Age of Onset  . Coronary artery disease Mother   . Diabetes Mother   . Hypertension Mother   . Dementia Father   . Sudden death Brother     suicide  . Cancer Paternal Grandfather     esophageal    Allergies  Allergen Reactions  . Oxycodone Hcl Itching and Other (See Comments)    confusion  . Penicillins Rash    Current Outpatient Prescriptions on File Prior to Visit  Medication Sig Dispense Refill  . clobetasol (TEMOVATE) 0.05 % external solution Apply 1 application topically daily as needed. Skin dryness  50 mL  1  . clopidogrel (PLAVIX) 75 MG tablet TAKE 1 TABLET (75 MG TOTAL) BY MOUTH DAILY.  30 tablet  11  . cyclobenzaprine (FLEXERIL) 10 MG tablet Take 1 tablet (10 mg total) by mouth  3 (three) times daily as needed for muscle spasms.  15 tablet  0  . escitalopram (LEXAPRO) 10 MG tablet TAKE 1 TABLET BY MOUTH EVERY DAY  90 tablet  1  . ESTRACE VAGINAL 0.1 MG/GM vaginal cream daily.      Marland Kitchen ezetimibe-simvastatin (VYTORIN) 10-80 MG per tablet Take 1 tablet by mouth at bedtime.  90 tablet  3  . fluticasone (FLONASE) 50 MCG/ACT nasal spray Place 1 spray into the nose daily.      . nitroGLYCERIN (NITROSTAT) 0.4 MG SL tablet Place 1 tablet (0.4 mg total) under the tongue every 5 (five) minutes as needed. Chest pain  25 tablet  3  . pantoprazole (PROTONIX) 40 MG tablet TAKE 1 TABLET (40 MG TOTAL) BY MOUTH DAILY.  30 tablet  11  . traMADol (ULTRAM) 50 MG tablet TAKE 2 TABLETS EVERY 8 HOURS AS NEEDED FOR PAIN  180 tablet  1  . zolpidem (AMBIEN) 10 MG  tablet TAKE 1 TABLET BY MOUTH AT BEDTIME AS NEEDED FOR SLEEP  30 tablet  3  . azithromycin (ZITHROMAX Z-PAK) 250 MG tablet Take 1 tablet (250 mg total) by mouth daily.  6 each  0   No current facility-administered medications on file prior to visit.    BP 124/78  Pulse 62  Wt 160 lb (72.576 kg)chart    Objective:   Physical Exam  Constitutional: She is oriented to person, place, and time. She appears well-developed and well-nourished.  Neck: Normal range of motion. Neck supple.  Cardiovascular: Normal rate, regular rhythm and normal heart sounds.   Pulmonary/Chest: Effort normal and breath sounds normal.  Abdominal: Soft. Bowel sounds are normal. She exhibits no distension. There is no tenderness. There is no rebound.  Musculoskeletal: Normal range of motion.  Neurological: She is alert and oriented to person, place, and time.  Skin: Skin is warm and dry.  Psychiatric: She has a normal mood and affect.          Assessment & Plan:  Assessment: 1. Dysuria 2. Urinary tract infection-treated with Macrobid 100 mg twice a day. Patient unable to tolerate sulfa medications. Drink plenty of water. Avoid caffeine.

## 2013-09-20 NOTE — Patient Instructions (Signed)
Urinary Tract Infection  Urinary tract infections (UTIs) can develop anywhere along your urinary tract. Your urinary tract is your body's drainage system for removing wastes and extra water. Your urinary tract includes two kidneys, two ureters, a bladder, and a urethra. Your kidneys are a pair of bean-shaped organs. Each kidney is about the size of your fist. They are located below your ribs, one on each side of your spine.  CAUSES  Infections are caused by microbes, which are microscopic organisms, including fungi, viruses, and bacteria. These organisms are so small that they can only be seen through a microscope. Bacteria are the microbes that most commonly cause UTIs.  SYMPTOMS   Symptoms of UTIs may vary by age and gender of the patient and by the location of the infection. Symptoms in young women typically include a frequent and intense urge to urinate and a painful, burning feeling in the bladder or urethra during urination. Older women and men are more likely to be tired, shaky, and weak and have muscle aches and abdominal pain. A fever may mean the infection is in your kidneys. Other symptoms of a kidney infection include pain in your back or sides below the ribs, nausea, and vomiting.  DIAGNOSIS  To diagnose a UTI, your caregiver will ask you about your symptoms. Your caregiver also will ask to provide a urine sample. The urine sample will be tested for bacteria and white blood cells. White blood cells are made by your body to help fight infection.  TREATMENT   Typically, UTIs can be treated with medication. Because most UTIs are caused by a bacterial infection, they usually can be treated with the use of antibiotics. The choice of antibiotic and length of treatment depend on your symptoms and the type of bacteria causing your infection.  HOME CARE INSTRUCTIONS   If you were prescribed antibiotics, take them exactly as your caregiver instructs you. Finish the medication even if you feel better after you  have only taken some of the medication.   Drink enough water and fluids to keep your urine clear or pale yellow.   Avoid caffeine, tea, and carbonated beverages. They tend to irritate your bladder.   Empty your bladder often. Avoid holding urine for long periods of time.   Empty your bladder before and after sexual intercourse.   After a bowel movement, women should cleanse from front to back. Use each tissue only once.  SEEK MEDICAL CARE IF:    You have back pain.   You develop a fever.   Your symptoms do not begin to resolve within 3 days.  SEEK IMMEDIATE MEDICAL CARE IF:    You have severe back pain or lower abdominal pain.   You develop chills.   You have nausea or vomiting.   You have continued burning or discomfort with urination.  MAKE SURE YOU:    Understand these instructions.   Will watch your condition.   Will get help right away if you are not doing well or get worse.  Document Released: 05/14/2005 Document Revised: 02/03/2012 Document Reviewed: 09/12/2011  [employer] Patient Information 2014 [employer].

## 2013-09-21 ENCOUNTER — Telehealth: Payer: Self-pay | Admitting: Internal Medicine

## 2013-09-21 NOTE — Telephone Encounter (Signed)
Relevant patient education assigned to patient using Emmi. ° °

## 2013-09-26 ENCOUNTER — Other Ambulatory Visit: Payer: Self-pay | Admitting: Internal Medicine

## 2013-10-24 ENCOUNTER — Other Ambulatory Visit: Payer: Self-pay | Admitting: Internal Medicine

## 2013-11-25 ENCOUNTER — Other Ambulatory Visit: Payer: Self-pay | Admitting: Internal Medicine

## 2013-11-28 NOTE — Telephone Encounter (Signed)
Pt following up on refill for tramadol.  Pt is out of this since Friday Pt states she was supposed to cb if the escitalopram (LEXAPRO) 10 MG tablet was not enough.  padonda first filled this for pt. Pt would like an increase in this med. [facility]/ [address]

## 2013-12-07 ENCOUNTER — Encounter: Payer: Self-pay | Admitting: Family

## 2013-12-07 ENCOUNTER — Ambulatory Visit (INDEPENDENT_AMBULATORY_CARE_PROVIDER_SITE_OTHER): Payer: Managed Care, Other (non HMO) | Admitting: Family

## 2013-12-07 VITALS — BP 124/72 | HR 63 | Temp 97.9°F | Ht 62.5 in | Wt 156.0 lb

## 2013-12-07 DIAGNOSIS — F3289 Other specified depressive episodes: Secondary | ICD-10-CM

## 2013-12-07 DIAGNOSIS — F329 Major depressive disorder, single episode, unspecified: Secondary | ICD-10-CM

## 2013-12-07 DIAGNOSIS — R300 Dysuria: Secondary | ICD-10-CM

## 2013-12-07 DIAGNOSIS — R350 Frequency of micturition: Secondary | ICD-10-CM

## 2013-12-07 DIAGNOSIS — N390 Urinary tract infection, site not specified: Secondary | ICD-10-CM

## 2013-12-07 DIAGNOSIS — F32A Depression, unspecified: Secondary | ICD-10-CM

## 2013-12-07 LAB — POCT URINALYSIS DIPSTICK
Bilirubin, UA: NEGATIVE
Ketones, UA: NEGATIVE
Leukocytes, UA: NEGATIVE
NITRITE UA: POSITIVE
Spec Grav, UA: 1.025
UROBILINOGEN UA: 1
pH, UA: 5

## 2013-12-07 MED ORDER — NITROFURANTOIN MONOHYD MACRO 100 MG PO CAPS: 100.0000 mg | ORAL_CAPSULE | Freq: Two times a day (BID) | ORAL | Status: DC

## 2013-12-07 MED ORDER — ESCITALOPRAM OXALATE 20 MG PO TABS: ORAL_TABLET | ORAL | Status: DC

## 2013-12-07 NOTE — Patient Instructions (Signed)
Urinary Tract Infection  Urinary tract infections (UTIs) can develop anywhere along your urinary tract. Your urinary tract is your body's drainage system for removing wastes and extra water. Your urinary tract includes two kidneys, two ureters, a bladder, and a urethra. Your kidneys are a pair of bean-shaped organs. Each kidney is about the size of your fist. They are located below your ribs, one on each side of your spine.  CAUSES  Infections are caused by microbes, which are microscopic organisms, including fungi, viruses, and bacteria. These organisms are so small that they can only be seen through a microscope. Bacteria are the microbes that most commonly cause UTIs.  SYMPTOMS   Symptoms of UTIs may vary by age and gender of the patient and by the location of the infection. Symptoms in young women typically include a frequent and intense urge to urinate and a painful, burning feeling in the bladder or urethra during urination. Older women and men are more likely to be tired, shaky, and weak and have muscle aches and abdominal pain. A fever may mean the infection is in your kidneys. Other symptoms of a kidney infection include pain in your back or sides below the ribs, nausea, and vomiting.  DIAGNOSIS  To diagnose a UTI, your caregiver will ask you about your symptoms. Your caregiver also will ask to provide a urine sample. The urine sample will be tested for bacteria and white blood cells. White blood cells are made by your body to help fight infection.  TREATMENT   Typically, UTIs can be treated with medication. Because most UTIs are caused by a bacterial infection, they usually can be treated with the use of antibiotics. The choice of antibiotic and length of treatment depend on your symptoms and the type of bacteria causing your infection.  HOME CARE INSTRUCTIONS   If you were prescribed antibiotics, take them exactly as your caregiver instructs you. Finish the medication even if you feel better after you  have only taken some of the medication.   Drink enough water and fluids to keep your urine clear or pale yellow.   Avoid caffeine, tea, and carbonated beverages. They tend to irritate your bladder.   Empty your bladder often. Avoid holding urine for long periods of time.   Empty your bladder before and after sexual intercourse.   After a bowel movement, women should cleanse from front to back. Use each tissue only once.  SEEK MEDICAL CARE IF:    You have back pain.   You develop a fever.   Your symptoms do not begin to resolve within 3 days.  SEEK IMMEDIATE MEDICAL CARE IF:    You have severe back pain or lower abdominal pain.   You develop chills.   You have nausea or vomiting.   You have continued burning or discomfort with urination.  MAKE SURE YOU:    Understand these instructions.   Will watch your condition.   Will get help right away if you are not doing well or get worse.  Document Released: 05/14/2005 Document Revised: 02/03/2012 Document Reviewed: 09/12/2011  [employer] Patient Information 2014 [employer].

## 2013-12-07 NOTE — Progress Notes (Signed)
Pre visit review using our clinic review tool, if applicable. No additional management support is needed unless otherwise documented below in the visit note. 

## 2013-12-07 NOTE — Progress Notes (Signed)
   Subjective:    Patient ID: Bianca [name], female    DOB: January 26, 1946, 68 y.o.   MRN: [id number]  HPI 68 year old white female, in today with complaints of urinary frequency and urgency ongoing x3 days. Reports being treated with Cipro in urgent care on 11/29/2013 and shortly thereafter, her symptoms returned. She was treated in February of 2015 for a UTI with Macrobid and tolerated the medication well. She wears any liners on a daily basis.  Patient also would like an increase in Lexapro. Feels more that M. blue. No feelings of helplessness, hopelessness, thoughts of death or dying.  Review of Systems     Objective:   Physical Exam        Assessment & Plan:  Tomi was seen today for urinary tract infection.  Diagnoses and associated orders for this visit:  UTI (urinary tract infection)  Dysuria - POCT urinalysis dipstick - Culture, Urine  Urinary frequency  Depression  Other Orders - nitrofurantoin, macrocrystal-monohydrate, (MACROBID) 100 MG capsule; Take 1 capsule (100 mg total) by mouth 2 (two) times daily. - escitalopram (LEXAPRO) 20 MG tablet; TAKE 1 TABLET BY MOUTH EVERY DAY    Return in 3-4 weeks for complete physical exam. Lexapro increased to 20 mg once daily. Encouraged exercise. Patient should also consider referral to urology given the frequency of her urinary tract infections. Today.

## 2013-12-09 LAB — URINE CULTURE
COLONY COUNT: NO GROWTH
ORGANISM ID, BACTERIA: NO GROWTH

## 2013-12-19 ENCOUNTER — Observation Stay (HOSPITAL_COMMUNITY)
Admission: EM | Admit: 2013-12-19 | Discharge: 2013-12-20 | Disposition: A | Discharge status: Home / Self Care | Payer: Managed Care, Other (non HMO) | Attending: Cardiovascular Disease | Admitting: Cardiovascular Disease

## 2013-12-19 ENCOUNTER — Observation Stay (HOSPITAL_COMMUNITY): Payer: Managed Care, Other (non HMO)

## 2013-12-19 ENCOUNTER — Encounter (HOSPITAL_COMMUNITY): Payer: Managed Care, Other (non HMO)

## 2013-12-19 ENCOUNTER — Encounter (HOSPITAL_COMMUNITY): Payer: Self-pay | Admitting: Emergency Medicine

## 2013-12-19 ENCOUNTER — Emergency Department (HOSPITAL_COMMUNITY): Payer: Managed Care, Other (non HMO)

## 2013-12-19 DIAGNOSIS — Z8739 Personal history of other diseases of the musculoskeletal system and connective tissue: Secondary | ICD-10-CM | POA: Insufficient documentation

## 2013-12-19 DIAGNOSIS — I2510 Atherosclerotic heart disease of native coronary artery without angina pectoris: Secondary | ICD-10-CM | POA: Insufficient documentation

## 2013-12-19 DIAGNOSIS — Z9861 Coronary angioplasty status: Secondary | ICD-10-CM | POA: Insufficient documentation

## 2013-12-19 DIAGNOSIS — Z79899 Other long term (current) drug therapy: Secondary | ICD-10-CM | POA: Insufficient documentation

## 2013-12-19 DIAGNOSIS — Z880 Allergy status to penicillin: Secondary | ICD-10-CM | POA: Insufficient documentation

## 2013-12-19 DIAGNOSIS — F17200 Nicotine dependence, unspecified, uncomplicated: Secondary | ICD-10-CM | POA: Diagnosis present

## 2013-12-19 DIAGNOSIS — R300 Dysuria: Secondary | ICD-10-CM | POA: Insufficient documentation

## 2013-12-19 DIAGNOSIS — I10 Essential (primary) hypertension: Secondary | ICD-10-CM | POA: Diagnosis present

## 2013-12-19 DIAGNOSIS — IMO0002 Reserved for concepts with insufficient information to code with codable children: Secondary | ICD-10-CM | POA: Insufficient documentation

## 2013-12-19 DIAGNOSIS — I25110 Atherosclerotic heart disease of native coronary artery with unstable angina pectoris: Secondary | ICD-10-CM | POA: Diagnosis present

## 2013-12-19 DIAGNOSIS — Z8669 Personal history of other diseases of the nervous system and sense organs: Secondary | ICD-10-CM | POA: Insufficient documentation

## 2013-12-19 DIAGNOSIS — F329 Major depressive disorder, single episode, unspecified: Secondary | ICD-10-CM | POA: Insufficient documentation

## 2013-12-19 DIAGNOSIS — R079 Chest pain, unspecified: Principal | ICD-10-CM | POA: Diagnosis present

## 2013-12-19 DIAGNOSIS — F3289 Other specified depressive episodes: Secondary | ICD-10-CM | POA: Insufficient documentation

## 2013-12-19 DIAGNOSIS — R1110 Vomiting, unspecified: Secondary | ICD-10-CM | POA: Insufficient documentation

## 2013-12-19 DIAGNOSIS — K219 Gastro-esophageal reflux disease without esophagitis: Secondary | ICD-10-CM | POA: Insufficient documentation

## 2013-12-19 DIAGNOSIS — R0602 Shortness of breath: Secondary | ICD-10-CM | POA: Insufficient documentation

## 2013-12-19 DIAGNOSIS — G8929 Other chronic pain: Secondary | ICD-10-CM | POA: Insufficient documentation

## 2013-12-19 DIAGNOSIS — E785 Hyperlipidemia, unspecified: Secondary | ICD-10-CM | POA: Diagnosis present

## 2013-12-19 DIAGNOSIS — R7989 Other specified abnormal findings of blood chemistry: Secondary | ICD-10-CM

## 2013-12-19 DIAGNOSIS — Z7902 Long term (current) use of antithrombotics/antiplatelets: Secondary | ICD-10-CM | POA: Insufficient documentation

## 2013-12-19 HISTORY — DX: Acute myocardial infarction, unspecified: I21.9

## 2013-12-19 LAB — URINE MICROSCOPIC-ADD ON

## 2013-12-19 LAB — COMPREHENSIVE METABOLIC PANEL
ALBUMIN: 3.6 g/dL (ref 3.5–5.2)
ALK PHOS: 82 U/L (ref 39–117)
ALT: 11 U/L (ref 0–35)
AST: 18 U/L (ref 0–37)
BILIRUBIN TOTAL: 0.6 mg/dL (ref 0.3–1.2)
BUN: 12 mg/dL (ref 6–23)
CHLORIDE: 102 meq/L (ref 96–112)
CO2: 25 mEq/L (ref 19–32)
Calcium: 9.3 mg/dL (ref 8.4–10.5)
Creatinine, Ser: 0.68 mg/dL (ref 0.50–1.10)
GFR calc Af Amer: >90 mL/min (ref >90)
GFR calc non Af Amer: 89 mL/min — ABNORMAL LOW (ref >90)
Glucose, Bld: 126 mg/dL — ABNORMAL HIGH (ref 70–99)
POTASSIUM: 4.2 meq/L (ref 3.7–5.3)
SODIUM: 139 meq/L (ref 137–147)
Total Protein: 7.2 g/dL (ref 6.0–8.3)

## 2013-12-19 LAB — CBC WITH DIFFERENTIAL/PLATELET
BASOS ABS: 0 10*3/uL (ref 0.0–0.1)
Basophils Relative: 0 % (ref 0–1)
Eosinophils Absolute: 0 10*3/uL (ref 0.0–0.7)
Eosinophils Relative: 0 % (ref 0–5)
HCT: 37.7 % (ref 36.0–46.0)
Hemoglobin: 13 g/dL (ref 12.0–15.0)
LYMPHS ABS: 0.7 10*3/uL (ref 0.7–4.0)
LYMPHS PCT: 5 % — AB (ref 12–46)
MCH: 31.3 pg (ref 26.0–34.0)
MCHC: 34.5 g/dL (ref 30.0–36.0)
MCV: 90.6 fL (ref 78.0–100.0)
Monocytes Absolute: 0.7 10*3/uL (ref 0.1–1.0)
Monocytes Relative: 5 % (ref 3–12)
NEUTROS ABS: 14.1 10*3/uL — AB (ref 1.7–7.7)
NEUTROS PCT: 90 % — AB (ref 43–77)
PLATELETS: 231 10*3/uL (ref 150–400)
RBC: 4.16 MIL/uL (ref 3.87–5.11)
RDW: 12.6 % (ref 11.5–15.5)
WBC: 15.6 10*3/uL — AB (ref 4.0–10.5)

## 2013-12-19 LAB — TROPONIN I

## 2013-12-19 LAB — URINALYSIS, ROUTINE W REFLEX MICROSCOPIC
Bilirubin Urine: NEGATIVE
GLUCOSE, UA: NEGATIVE mg/dL
KETONES UR: NEGATIVE mg/dL
Nitrite: POSITIVE — AB
Protein, ur: NEGATIVE mg/dL
Specific Gravity, Urine: >1.03 — ABNORMAL HIGH (ref 1.005–1.030)
Urobilinogen, UA: 1 mg/dL (ref 0.0–1.0)
pH: 5.5 (ref 5.0–8.0)

## 2013-12-19 LAB — PRO B NATRIURETIC PEPTIDE: PRO B NATRI PEPTIDE: 353.3 pg/mL — AB (ref 0–125)

## 2013-12-19 MED ORDER — REGADENOSON 0.4 MG/5ML IV SOLN
INTRAVENOUS | Status: AC
  Administered 2013-12-19: 17:00:00
  Filled 2013-12-19: qty 5

## 2013-12-19 MED ORDER — REGADENOSON 0.4 MG/5ML IV SOLN
0.4000 mg | Freq: Once | INTRAVENOUS | Status: AC
  Administered 2013-12-19: 0.4 mg via INTRAVENOUS

## 2013-12-19 MED ORDER — ASPIRIN 300 MG RE SUPP
300.0000 mg | RECTAL | Status: AC
  Filled 2013-12-19: qty 1

## 2013-12-19 MED ORDER — ONDANSETRON HCL 4 MG/2ML IJ SOLN
4.0000 mg | Freq: Once | INTRAMUSCULAR | Status: AC
Start: 2013-12-19 — End: 2013-12-19
  Administered 2013-12-19: 4 mg via INTRAVENOUS
  Filled 2013-12-19: qty 2

## 2013-12-19 MED ORDER — METOPROLOL TARTRATE 25 MG PO TABS
25.0000 mg | ORAL_TABLET | Freq: Two times a day (BID) | ORAL | Status: DC
  Administered 2013-12-19 – 2013-12-20 (×2): 25 mg via ORAL
  Filled 2013-12-19 (×3): qty 1

## 2013-12-19 MED ORDER — SODIUM CHLORIDE 0.9 % IV BOLUS (SEPSIS)
500.0000 mL | Freq: Once | INTRAVENOUS | Status: AC
  Administered 2013-12-19: 500 mL via INTRAVENOUS

## 2013-12-19 MED ORDER — PANTOPRAZOLE SODIUM 40 MG PO TBEC
40.0000 mg | DELAYED_RELEASE_TABLET | Freq: Every day | ORAL | Status: DC
  Administered 2013-12-19 – 2013-12-20 (×2): 40 mg via ORAL
  Filled 2013-12-19 (×2): qty 1

## 2013-12-19 MED ORDER — ESCITALOPRAM OXALATE 20 MG PO TABS
20.0000 mg | ORAL_TABLET | Freq: Every day | ORAL | Status: DC
  Administered 2013-12-19 – 2013-12-20 (×2): 20 mg via ORAL
  Filled 2013-12-19 (×2): qty 1

## 2013-12-19 MED ORDER — ESTRADIOL 0.1 MG/GM VA CREA
1.0000 | TOPICAL_CREAM | VAGINAL | Status: DC
  Filled 2013-12-19: qty 42.5

## 2013-12-19 MED ORDER — SIMVASTATIN 80 MG PO TABS
80.0000 mg | ORAL_TABLET | Freq: Every day | ORAL | Status: DC
  Administered 2013-12-19: 80 mg via ORAL
  Filled 2013-12-19 (×2): qty 1

## 2013-12-19 MED ORDER — NITROGLYCERIN 0.4 MG SL SUBL: 0.4000 mg | SUBLINGUAL_TABLET | SUBLINGUAL | Status: DC | PRN

## 2013-12-19 MED ORDER — TECHNETIUM TC 99M SESTAMIBI GENERIC - CARDIOLITE
30.0000 | Freq: Once | INTRAVENOUS | Status: AC | PRN
  Administered 2013-12-19: 30 via INTRAVENOUS

## 2013-12-19 MED ORDER — TRAMADOL HCL 50 MG PO TABS
100.0000 mg | ORAL_TABLET | Freq: Three times a day (TID) | ORAL | Status: DC | PRN
  Administered 2013-12-19 (×2): 100 mg via ORAL
  Filled 2013-12-19 (×2): qty 2

## 2013-12-19 MED ORDER — CLOPIDOGREL BISULFATE 75 MG PO TABS
75.0000 mg | ORAL_TABLET | Freq: Every day | ORAL | Status: DC
  Administered 2013-12-19 – 2013-12-20 (×2): 75 mg via ORAL
  Filled 2013-12-19 (×2): qty 1

## 2013-12-19 MED ORDER — ASPIRIN 81 MG PO CHEW
324.0000 mg | CHEWABLE_TABLET | ORAL | Status: AC
  Filled 2013-12-19: qty 4

## 2013-12-19 MED ORDER — TECHNETIUM TC 99M SESTAMIBI GENERIC - CARDIOLITE
10.0000 | Freq: Once | INTRAVENOUS | Status: AC | PRN
  Administered 2013-12-19: 10 via INTRAVENOUS

## 2013-12-19 MED ORDER — ASPIRIN EC 81 MG PO TBEC
81.0000 mg | DELAYED_RELEASE_TABLET | Freq: Every day | ORAL | Status: DC
  Administered 2013-12-20: 81 mg via ORAL
  Filled 2013-12-19 (×2): qty 1

## 2013-12-19 MED ORDER — FAMOTIDINE IN NACL 20-0.9 MG/50ML-% IV SOLN
20.0000 mg | Freq: Once | INTRAVENOUS | Status: AC
  Administered 2013-12-19: 20 mg via INTRAVENOUS
  Filled 2013-12-19: qty 50

## 2013-12-19 MED ORDER — ZOLPIDEM TARTRATE 5 MG PO TABS
10.0000 mg | ORAL_TABLET | Freq: Every evening | ORAL | Status: DC | PRN
  Administered 2013-12-19: 10 mg via ORAL
  Filled 2013-12-19: qty 2

## 2013-12-19 MED ORDER — AMINOPHYLLINE 25 MG/ML IV SOLN
80.0000 mg | INTRAVENOUS | Status: AC
  Administered 2013-12-19: 75 mg via INTRAVENOUS

## 2013-12-19 NOTE — ED Notes (Signed)
Pt rcvd 324 mg ASA, x 2 SL nitro, 250 mL NS, & 4 mg Zofran

## 2013-12-19 NOTE — Progress Notes (Addendum)
Received pt. From ED,pt. Alert and oriented.Pt. From home with family.Pt. Is ambulatory.Telemetry box [id number] on.Keep monitoring pt. Closely and assessing her needs.

## 2013-12-19 NOTE — ED Notes (Signed)
Family at bedside. 

## 2013-12-19 NOTE — ED Notes (Signed)
Pt in from home via [facility], per report onset of radiating CP to L shoulder & back, onset today @ 5am, pt c/o SOB, N/V/D, with x 5 clear vomitus, denies diarrhea, hx of x 5 stents & x 3 MIs, pt A&O x4, follows commands,speaks in complete sentences, NSR in route

## 2013-12-19 NOTE — H&P (Signed)
Chief Complaint:  Chest pain, nausea, vomiting  HPI:  This is a 68 y.o. female with a past medical history significant for CAD with early onset. He presents today with abrupt onset of chest pain radiating to her left shoulder and her back associated with shortness of breath nausea and 5 episodes of clear emesis. Symptoms have now subsided. Her electrocardiogram is normal. The point of care cardiac enzyme assay is negative. Symptoms are reminiscent of previous presentation of myocardial infarction.  She was only 63 when she presented with a myocardial infarction in 1997 and received plain old balloon angioplasty. Presented again with an inferior wall myocardial infarction and received 3 drug-eluting stents to the right coronary artery. Non-ST segment elevation myocardial infarction September 2013, again secondary to lesions in the right coronary artery. Received a drug-eluting stent to the distal right coronary artery (2.75 x 33 mm Xience expedition) and balloon angioplasty to the posterior descending artery. No new coronary events or symptoms since that time.  Despite these multiple events her left ventricular ejection fraction remains normal at 55-60%. Her last non-STEMI had a minimal elevation in enzymes with a peak troponin of only 0.76.  Bypass surgery has been suggested as the next intervention if she again develops restenosis.  He has treated hyperlipidemia and hypertension with both cholesterol and blood pressure levels in the desirable range. Mild residual hypertriglyceridemia. Unfortunately she is unable to quit smoking.  She is currently receiving nitrofurantoin for urinary tract infection and took the first dose last evening. She thinks it is the first time she has ever taken that medication but reviewing her chart she was treated with the same in February. She also received a course of Cipro roughly 2 weeks ago. Reports allergy to penicillin and intolerance to sulfa. Urinalysis on April 22  showed positive nitrites.   She is planning to undergo a hip replacement.  PMHx:  Past Medical History  Diagnosis Date  . CAD (coronary artery disease)     a. 1997 MI/PCI RCA;  b. 2007 MI/PCI of 100% RCA with Taxus DES x 3 placed, EF 55%;  c. 2010 Cath: stable anatomy;  d. 05/2012 NSTEMI/Cath/PCI: LM nl, LAD 60-84m D1 20ost, LCX 540mRCA 40-5046mR, 60/95d (Treated w/ 2.75x33 Xience Xpedition DES), PDA 48m59meated w/ PTCA), EF 60%.  . Depression   . Hyperlipidemia   . Hypertension   . Carpal tunnel syndrome on both sides   . Numbness of foot     "never recovered from last back OR, 2009" (05/17/2012)  . GERD (gastroesophageal reflux disease)   . Arthritis     "back; probably knees" (05/17/2012)  . Chronic lower back pain     Past Surgical History  Procedure Laterality Date  . Cesarean section  1990  . Back surgery    . Ankle surgery      left; "had to take out a floater" (05/17/2012)  . Posterior fusion lumbar spine  1962; 2009  . Coronary angioplasty with stent placement  1997; 2007    "2 + 2; total of 4"    FAMHx:  Family History  Problem Relation Age of Onset  . Coronary artery disease Mother   . Diabetes Mother   . Hypertension Mother   . Dementia Father   . Sudden death Brother     suicide  . Cancer Paternal Grandfather     esophageal    SOCHx:   reports that she has been smoking Cigarettes.  She has a 25 pack-year smoking history.  She has never used smokeless tobacco. She reports that she does not drink alcohol or use illicit drugs.  ALLERGIES:  Allergies  Allergen Reactions  . Oxycodone Hcl Itching and Other (See Comments)    confusion  . Penicillins Rash    ROS: Constitutional: positive for malaise, negative for chills, fevers, night sweats and weight loss Eyes: negative Ears, nose, mouth, throat, and face: negative for epistaxis, nasal congestion and sore throat Respiratory: positive for dyspnea on exertion, negative for cough, hemoptysis, sputum and  wheezing Cardiovascular: positive for chest pressure/discomfort and dyspnea, negative for lower extremity edema, orthopnea, palpitations, paroxysmal nocturnal dyspnea and syncope Gastrointestinal: positive for nausea and vomiting, negative for constipation, diarrhea, dysphagia, jaundice, melena and odynophagia Genitourinary:positive for urgency and frequency Integument/breast: negative for rash Hematologic/lymphatic: negative for bleeding, easy bruising and petechiae Musculoskeletal:negative Neurological: negative Behavioral/Psych: positive for depression Endocrine: negative for diabetic symptoms including polydipsia, polyphagia and weight loss Allergic/Immunologic: negative  HOME MEDS:  (Not in a hospital admission)  LABS/IMAGING: Results for orders placed during the hospital encounter of 12/19/13 (from the past 48 hour(s))  CBC WITH DIFFERENTIAL     Status: Abnormal   Collection Time    12/19/13  7:43 AM      Result Value Ref Range   WBC 15.6 (*) 4.0 - 10.5 K/uL   RBC 4.16  3.87 - 5.11 MIL/uL   Hemoglobin 13.0  12.0 - 15.0 g/dL   HCT 37.7  36.0 - 46.0 %   MCV 90.6  78.0 - 100.0 fL   MCH 31.3  26.0 - 34.0 pg   MCHC 34.5  30.0 - 36.0 g/dL   RDW 12.6  11.5 - 15.5 %   Platelets 231  150 - 400 K/uL   Neutrophils Relative % 90 (*) 43 - 77 %   Neutro Abs 14.1 (*) 1.7 - 7.7 K/uL   Lymphocytes Relative 5 (*) 12 - 46 %   Lymphs Abs 0.7  0.7 - 4.0 K/uL   Monocytes Relative 5  3 - 12 %   Monocytes Absolute 0.7  0.1 - 1.0 K/uL   Eosinophils Relative 0  0 - 5 %   Eosinophils Absolute 0.0  0.0 - 0.7 K/uL   Basophils Relative 0  0 - 1 %   Basophils Absolute 0.0  0.0 - 0.1 K/uL  TROPONIN I     Status: None   Collection Time    12/19/13  7:43 AM      Result Value Ref Range   Troponin I <0.30  <0.30 ng/mL   Comment:            Due to the release kinetics of cTnI,     a negative result within the first hours     of the onset of symptoms does not rule out     myocardial infarction with  certainty.     If myocardial infarction is still suspected,     repeat the test at appropriate intervals.  PRO B NATRIURETIC PEPTIDE     Status: Abnormal   Collection Time    12/19/13  7:43 AM      Result Value Ref Range   Pro B Natriuretic peptide (BNP) 353.3 (*) 0 - 125 pg/mL  COMPREHENSIVE METABOLIC PANEL     Status: Abnormal   Collection Time    12/19/13  7:46 AM      Result Value Ref Range   Sodium 139  137 - 147 mEq/L   Potassium 4.2  3.7 - 5.3  mEq/L   Chloride 102  96 - 112 mEq/L   CO2 25  19 - 32 mEq/L   Glucose, Bld 126 (*) 70 - 99 mg/dL   BUN 12  6 - 23 mg/dL   Creatinine, Ser 0.68  0.50 - 1.10 mg/dL   Calcium 9.3  8.4 - 10.5 mg/dL   Total Protein 7.2  6.0 - 8.3 g/dL   Albumin 3.6  3.5 - 5.2 g/dL   AST 18  0 - 37 U/L   ALT 11  0 - 35 U/L   Alkaline Phosphatase 82  39 - 117 U/L   Total Bilirubin 0.6  0.3 - 1.2 mg/dL   GFR calc non Af Amer 89 (*) >90 mL/min   GFR calc Af Amer >90  >90 mL/min   Comment: (NOTE)     The eGFR has been calculated using the CKD EPI equation.     This calculation has not been validated in all clinical situations.     eGFR's persistently <90 mL/min signify possible Chronic Kidney     Disease.   Dg Chest 2 View  12/19/2013   CLINICAL DATA:  Chest pain, shortness of Breath  EXAM: CHEST  2 VIEW  COMPARISON:  05/17/2012  FINDINGS: Cardiomediastinal silhouette is stable. Hyperinflation again noted. Stable chronic interstitial prominence. Stable cystic changes bilateral humeral head. Mild thoracic dextroscoliosis. Chronic mild interstitial prominence. No acute infiltrate or pleural effusion. No pulmonary edema.  IMPRESSION: No active disease. Hyperinflation again noted. Mild thoracic dextroscoliosis.   Electronically Signed   By: Lahoma Crocker M.D.   On: 12/19/2013 08:11    VITALS: Blood pressure 114/56, pulse 70, temperature 98.6 F (37 C), temperature source Oral, resp. rate 17, height 5' 3.5" (1.613 m), weight 155 lb (70.308 kg), SpO2  91.00%.  EXAM:  General: Alert, oriented x3, no distress Head: no evidence of trauma, PERRL, EOMI, no exophtalmos or lid lag, no myxedema, no xanthelasma; normal ears, nose and oropharynx Neck: Normal jugular venous pulsations and no hepatojugular reflux; brisk carotid pulses without delay and no carotid bruits Chest: clear to auscultation, no signs of consolidation by percussion or palpation, normal fremitus, symmetrical and full respiratory excursions Cardiovascular: normal position and quality of the apical impulse, regular rhythm, normal first heart sound and normal second heart sounds, no rubs or gallops, no murmur Abdomen: no tenderness or distention, no masses by palpation, no abnormal pulsatility or arterial bruits, normal bowel sounds, no hepatosplenomegaly Extremities: no clubbing, cyanosis or edema; 2+ radial, ulnar and brachial pulses bilaterally; 2+ right femoral, posterior tibial and dorsalis pedis pulses; 2+ left femoral, posterior tibial and dorsalis pedis pulses; no subclavian or femoral bruits Neurological: grossly nonfocal   IMPRESSION/PLAN: Recurrent chest pain syndrome is similar to her previous presentation, but is not associated with high-risk features such as ECG changes or enzyme abnormalities. Admit for observation. Intravenous heparin not indicated at this time. Repeat cardiac enzymes. If these remain normal performed vasodilator myocardial perfusion study. If ECG changes develop or cardiac enzymes are abnormal, proceed directly to cardiac catheterization.  If there is evidence of restenosis in the right coronary artery, consider cardiac surgery consultation. She is currently receiving clopidogrel. If bypass surgery is indicated it'll have to be postponed until next week.  Elevated white blood cell count is also noted, suggesting a possible alternative (infectious) explanation for her symptoms. Notes recent treatment with antibiotics. Nausea and vomiting could be  secondary to nitrofurantoin.   Sanda Klein, MD, [facility] [phone] office [phone] pager  12/19/2013, 9:38 AM

## 2013-12-19 NOTE — ED Provider Notes (Signed)
CSN: [id number]     Arrival date & time 12/19/13  0732 History   First MD Initiated Contact with Patient 12/19/13 [phone]     Chief Complaint  Patient presents with  . Chest Pain     (Consider location/radiation/quality/duration/timing/severity/associated sxs/prior Treatment) HPI Comments: 68 year old female with history of lipids, CAD, nSTEMI, 5 cardiac stents, reflux presents with anterior chest pressure with radiation to left shoulder. Patient has had nausea and vomiting with the symptoms. This is similar to her cardiac history. Patient denies recent calf since nSTEMI, Dr. Harrington Challenger the [facility] cardiology.  Symptoms improved with nitroglycerin prior to arrival patient has mild symptoms currently. Patient recently has had gradually worsening exertional symptoms, unsure amount of distance.  Patient took aspirin and Plavix today and has not missed doses. Patient denies blood clot history, leg pain or leg swelling, recent surgery or hemoptysis.  Patient is a 68 y.o. female presenting with chest pain. The history is provided by the patient.  Chest Pain Associated symptoms: shortness of breath and vomiting   Associated symptoms: no abdominal pain, no back pain, no cough, no fever and no headache     Past Medical History  Diagnosis Date  . CAD (coronary artery disease)     a. 1997 MI/PCI RCA;  b. 2007 MI/PCI of 100% RCA with Taxus DES x 3 placed, EF 55%;  c. 2010 Cath: stable anatomy;  d. 05/2012 NSTEMI/Cath/PCI: LM nl, LAD 60-62m, D1 20ost, LCX 22m, RCA 40-95m ISR, 60/95d (Treated w/ 2.75x33 Xience Xpedition DES), PDA 74m (Treated w/ PTCA), EF 60%.  . Depression   . Hyperlipidemia   . Hypertension   . Carpal tunnel syndrome on both sides   . Numbness of foot     "never recovered from last back OR, 2009" (05/17/2012)  . GERD (gastroesophageal reflux disease)   . Arthritis     "back; probably knees" (05/17/2012)  . Chronic lower back pain    Past Surgical History  Procedure Laterality Date  .  Cesarean section  1990  . Back surgery    . Ankle surgery      left; "had to take out a floater" (05/17/2012)  . Posterior fusion lumbar spine  1962; 2009  . Coronary angioplasty with stent placement  1997; 2007    "2 + 2; total of 4"   Family History  Problem Relation Age of Onset  . Coronary artery disease Mother   . Diabetes Mother   . Hypertension Mother   . Dementia Father   . Sudden death Brother     suicide  . Cancer Paternal Grandfather     esophageal   History  Substance Use Topics  . Smoking status: Current Every Day Smoker -- 0.50 packs/day for 50 years    Types: Cigarettes  . Smokeless tobacco: Never Used  . Alcohol Use: No   OB History   Grav Para Term Preterm Abortions TAB SAB Ect Mult Living                 Review of Systems  Constitutional: Positive for appetite change. Negative for fever and chills.  HENT: Negative for congestion.   Eyes: Negative for visual disturbance.  Respiratory: Positive for shortness of breath. Negative for cough.   Cardiovascular: Positive for chest pain. Negative for leg swelling.  Gastrointestinal: Positive for vomiting. Negative for abdominal pain.  Genitourinary: Positive for dysuria. Negative for flank pain.  Musculoskeletal: Negative for back pain, neck pain and neck stiffness.  Skin: Negative for rash.  Neurological: Negative for light-headedness and headaches.      Allergies  Oxycodone hcl and Penicillins  Home Medications   Prior to Admission medications   Medication Sig Start Date End Date Taking? Authorizing Provider  clobetasol (TEMOVATE) 0.05 % external solution Apply 1 application topically daily as needed. Skin dryness 05/31/12   Timoteo Gaul, FNP  clopidogrel (PLAVIX) 75 MG tablet TAKE 1 TABLET (75 MG TOTAL) BY MOUTH DAILY. 07/12/13   Fay Records, MD  cyclobenzaprine (FLEXERIL) 10 MG tablet Take 1 tablet (10 mg total) by mouth 3 (three) times daily as needed for muscle spasms. 02/21/13   Timoteo Gaul, FNP  escitalopram (LEXAPRO) 20 MG tablet TAKE 1 TABLET BY MOUTH EVERY DAY 12/07/13   Timoteo Gaul, FNP  ESTRACE VAGINAL 0.1 MG/GM vaginal cream daily. 11/07/12   Historical Provider, MD  ezetimibe-simvastatin (VYTORIN) 10-80 MG per tablet Take 1 tablet by mouth at bedtime. 02/03/13   Lisabeth Pick, MD  fluticasone (FLONASE) 50 MCG/ACT nasal spray Place 1 spray into the nose daily. 11/30/12   Historical Provider, MD  metoprolol (LOPRESSOR) 50 MG tablet TAKE 1/2 TABLETS (25 MG TOTAL) BY MOUTH 2 (TWO) TIMES DAILY. 09/20/13   Timoteo Gaul, FNP  nitrofurantoin, macrocrystal-monohydrate, (MACROBID) 100 MG capsule Take 1 capsule (100 mg total) by mouth 2 (two) times daily. 12/07/13   Timoteo Gaul, FNP  nitroGLYCERIN (NITROSTAT) 0.4 MG SL tablet Place 1 tablet (0.4 mg total) under the tongue every 5 (five) minutes as needed. Chest pain 05/19/12   Rogelia Mire, NP  pantoprazole (PROTONIX) 40 MG tablet TAKE 1 TABLET (40 MG TOTAL) BY MOUTH DAILY. 07/12/13   Fay Records, MD  simvastatin (ZOCOR) 80 MG tablet Take 1 tablet (80 mg total) by mouth at bedtime. 09/20/13   Timoteo Gaul, FNP  traMADol (ULTRAM) 50 MG tablet TAKE 2 TABLETS BY MOUTH EVERY 8 HOURS    Bruce H Swords, MD  zolpidem (AMBIEN) 10 MG tablet TAKE 1 TABLET BY MOUTH AT BEDTIME AS NEEDED FOR SLEEP 09/15/13   Lisabeth Pick, MD   BP 111/58  Pulse 68  Temp(Src) 98.6 F (37 C) (Oral)  Resp 14  Ht 5' 3.5" (1.613 m)  Wt 155 lb (70.308 kg)  BMI 27.02 kg/m2  SpO2 91% Physical Exam  Nursing note and vitals reviewed. Constitutional: She is oriented to person, place, and time. She appears well-developed and well-nourished.  HENT:  Head: Normocephalic and atraumatic.  Eyes: Conjunctivae are normal. Right eye exhibits no discharge. Left eye exhibits no discharge.  Neck: Normal range of motion. Neck supple. No tracheal deviation present.  Cardiovascular: Normal rate and regular rhythm.   Pulmonary/Chest: Effort normal  and breath sounds normal.  Abdominal: Soft. She exhibits no distension. There is no tenderness. There is no guarding.  Musculoskeletal: She exhibits no edema and no tenderness.  Neurological: She is alert and oriented to person, place, and time.  Skin: Skin is warm. No rash noted.  Psychiatric: She has a normal mood and affect.    ED Course  Procedures (including critical care time) Labs Review Labs Reviewed  CBC WITH DIFFERENTIAL - Abnormal; Notable for the following:    WBC 15.6 (*)    Neutrophils Relative % 90 (*)    Neutro Abs 14.1 (*)    Lymphocytes Relative 5 (*)    All other components within normal limits  PRO B NATRIURETIC PEPTIDE - Abnormal; Notable for the following:    Pro B  Natriuretic peptide (BNP) 353.3 (*)    All other components within normal limits  COMPREHENSIVE METABOLIC PANEL - Abnormal; Notable for the following:    Glucose, Bld 126 (*)    GFR calc non Af Amer 89 (*)    All other components within normal limits  URINALYSIS, ROUTINE W REFLEX MICROSCOPIC - Abnormal; Notable for the following:    APPearance CLOUDY (*)    Specific Gravity, Urine >1.030 (*)    Hgb urine dipstick TRACE (*)    Nitrite POSITIVE (*)    Leukocytes, UA TRACE (*)    All other components within normal limits  URINE MICROSCOPIC-ADD ON - Abnormal; Notable for the following:    Bacteria, UA MANY (*)    Casts HYALINE CASTS (*)    Crystals CA OXALATE CRYSTALS (*)    All other components within normal limits  URINE CULTURE  TROPONIN I  TROPONIN I  TROPONIN I  TROPONIN I  TROPONIN I  URINALYSIS, ROUTINE W REFLEX MICROSCOPIC    Imaging Review No results found.   EKG Interpretation   Date/Time:  Monday Dec 19 2013 07:32:37 EDT Ventricular Rate:  69 PR Interval:  149 QRS Duration: 96 QT Interval:  378 QTC Calculation: 405 R Axis:   71 Text Interpretation:  Age not entered, assumed to be  68 years old for  purpose of ECG interpretation Sinus rhythm Overall similar to  previous,  increased voltage V4-6 Confirmed by Briawna Carver  MD, Cassey Hurrell ([id number]) on 12/19/2013  7:38:26 AM      MDM   Final diagnoses:  Chest pain  Unspecified essential hypertension  CAD (coronary artery disease)  Hyperlipidemia   High risk cardiac patient with known disease. Reviewed last cath result. Coronary angiography:  Coronary dominance: Right  Left Main: Normal in size and free of significant disease.  Left Anterior Descending (LAD): Normal in size with mild calcifications and atherosclerosis. There is a 60-70% lesion in the mid LAD which appears to be stable from previous cardiac catheterization. The rest of the LAD has minor irregularities.  1st diagonal (D1): Large in size with mild 20% stenosis at the ostium.  2nd diagonal (D2): Very small in size.  3rd diagonal (D3): Very small in size.  Circumflex (LCx): Normal in size and nondominant. There is a 50% lesion in the midsegment. The rest of the vessel has minor irregularities.  1st obtuse marginal: Small in size and free of significant disease.  2nd obtuse marginal: Very small in size.  3rd obtuse marginal: Normal in size with minor irregularities.  The AV groove continuation is free of significant disease and gives one posterior lateral branch.  Right Coronary Artery: Normal in size and dominant. Multiple overlapped stents are noted from the proximal to the distal segment. In the midsegment there is a 40-50% in-stent restenosis. Distally, there is a 95% stenosis and a 60% stenosis more proximal to that.  1. The RCA was treated with a 2.75 x 33 mm Xience expedition DES posterior descending artery: Normal in size with 80% disease in the midsegment. The vessel diameter is 2 mm in that location.  1. The PDA was treated with PTCA only. posterior lateral branch: Normal in size with minor irregularities.  Left ventriculography: Left ventricular systolic function is normal , LVEF is estimated at 60 %.  _____________ Plan for cardiac  workup in the ER, nitroglycerin and repeat EKG if symptoms worsen and cardiology consult.  Symptoms improved in the ER. Cardiology evaluated and admitted the patient.  Freddie Breech  Reather Converse, MD 12/19/13 [id number]

## 2013-12-20 LAB — URINE CULTURE: Colony Count: 40000

## 2013-12-20 NOTE — Discharge Summary (Signed)
Physician Discharge Summary     Patient ID: Bianca [name] MRN: [id number] DOB/AGE: 03-23-1946 68 y.o.  Admit date: 12/19/2013 Discharge date: 12/20/2013  Admission Diagnoses:  Chest pain  Discharge Diagnoses:  Principal Problem:   Chest pain Active Problems:   HYPERLIPIDEMIA   TOBACCO USE   HYPERTENSION   CORONARY ARTERY DISEASE   Discharged Condition: stable  Hospital Course:  This is a 68 y.o. female with a past medical history significant for CAD with early onset. He presents today with abrupt onset of chest pain radiating to her left shoulder and her back associated with shortness of breath nausea and 5 episodes of clear emesis. Symptoms have now subsided. Her electrocardiogram is normal. The point of care cardiac enzyme assay is negative. Symptoms are reminiscent of previous presentation of myocardial infarction.   She was only 68 when she presented with a myocardial infarction in 1997 and received plain old balloon angioplasty. Presented again with an inferior wall myocardial infarction and received 3 drug-eluting stents to the right coronary artery. Non-ST segment elevation myocardial infarction September 2013, again secondary to lesions in the right coronary artery. Received a drug-eluting stent to the distal right coronary artery (2.75 x 33 mm Xience expedition) and balloon angioplasty to the posterior descending artery. No new coronary events or symptoms since that time.   Despite these multiple events her left ventricular ejection fraction remains normal at 55-60%. Her last non-STEMI had a minimal elevation in enzymes with a peak troponin of only 0.76.   Bypass surgery has been suggested as the next intervention if she again develops restenosis.  He has treated hyperlipidemia and hypertension with both cholesterol and blood pressure levels in the desirable range. Mild residual hypertriglyceridemia. Unfortunately she is unable to quit smoking.   She is currently receiving  nitrofurantoin for urinary tract infection and took the first dose last evening. She thinks it is the first time she has ever taken that medication but reviewing her chart she was treated with the same in February. She also received a course of Cipro roughly 2 weeks ago. Reports allergy to penicillin and intolerance to sulfa. Urinalysis on April 22 showed positive nitrites.  She is planning to undergo a hip replacement.   She was admitted and ruled out for MI.  Lexiscan myovue was negative for ischemia and EF is 67%.  Tobacco cessation was discussed.  Consider changing statin to lipitor 40mg . The patient was seen by Dr. Acie Fredrickson who felt she was stable for DC home.      Consults: None  Significant Diagnostic Studies:  MYOCARDIAL IMAGING WITH SPECT (REST AND PHARMACOLOGIC-STRESS)  GATED LEFT VENTRICULAR WALL MOTION STUDY  LEFT VENTRICULAR EJECTION FRACTION  TECHNIQUE: Standard myocardial SPECT imaging was performed after resting intravenous injection of 10 mCi Tc-60m sestamibi. Subsequently, intravenous infusion of Lexiscan was performed under the supervision of the Cardiology staff. At peak effect of the drug, 30 mCi Tc-93m sestamibi was injected intravenously and standard myocardial SPECT imaging was performed. Quantitative gated imaging was also performed to evaluate left ventricular wall motion, and estimate left ventricular ejection fraction.  COMPARISON: None.  FINDINGS: Calculated ejection fraction is 67%. The end systolic volume is 23 mL. End-diastolic volume is 71 mL. Wall motion is normal. There is no ischemia or infarction. No fixed or reversible defects are identified in the myocardium.  IMPRESSION: 1. Normal myocardial perfusion study. No fixed or reversible defects. 2. Calculated ejection fraction of 67%.   Treatments: See above  Discharge Exam: Blood pressure 126/61,  pulse 62, temperature 98.1 F (36.7 C), temperature source Oral, resp. rate 20, height 5' 3.5"  (1.613 m), weight 157 lb 8 oz (71.442 kg), SpO2 94.00%.   Disposition: 01-Home or Self Care      Discharge Orders   Future Appointments Provider Department Dept Phone   01/03/2014 9:30 AM [facility] [place] at [place]   01/10/2014 8:15 AM Timoteo Gaul, [place] at [place]   01/23/2014 3:15 PM Fay Records, MD [facility] [phone]   Future Orders Complete By Expires   Diet - low sodium heart healthy  As directed    Increase activity slowly  As directed        Medication List         aspirin EC 81 MG tablet  Take 81 mg by mouth daily.     clopidogrel 75 MG tablet  Commonly known as:  PLAVIX  Take 75 mg by mouth daily.     escitalopram 20 MG tablet  Commonly known as:  LEXAPRO  Take 20 mg by mouth daily.     ESTRACE VAGINAL 0.1 MG/GM vaginal cream  Generic drug:  estradiol  Place 1 Applicatorful vaginally once a week.     metoprolol 50 MG tablet  Commonly known as:  LOPRESSOR  Take 25 mg by mouth 2 (two) times daily.     nitrofurantoin (macrocrystal-monohydrate) 100 MG capsule  Commonly known as:  MACROBID  Take 1 capsule (100 mg total) by mouth 2 (two) times daily.     nitroGLYCERIN 0.4 MG SL tablet  Commonly known as:  NITROSTAT  Place 1 tablet (0.4 mg total) under the tongue every 5 (five) minutes as needed. Chest pain     pantoprazole 40 MG tablet  Commonly known as:  PROTONIX  Take 40 mg by mouth daily.     simvastatin 80 MG tablet  Commonly known as:  ZOCOR  Take 1 tablet (80 mg total) by mouth at bedtime.     traMADol 50 MG tablet  Commonly known as:  ULTRAM  Take 100 mg by mouth every 8 (eight) hours as needed (for pain).     zolpidem 10 MG tablet  Commonly known as:  AMBIEN  Take 10 mg by mouth at bedtime as needed for sleep.       Follow-up Information   Follow up with Dorris Carnes, MD On 01/23/2014. (3:15 PM)    Specialty:  Cardiology   Contact information:    [address] [place] [address] [phone]       Signed: Tarri Fuller 12/20/2013, 2:30 PM  See my note from earlier the same day.  Thayer Headings, Brooke Bonito., MD, [facility] 12/20/2013, 3:11 PM Office - [phone] Pager [phone]

## 2013-12-20 NOTE — Progress Notes (Signed)
    Subjective: Pt is feeling well.  Stress myoview yesterday was normal   Objective: Vital signs in last 24 hours: Temp:  [98.1 F (36.7 C)] 98.1 F (36.7 C) (05/05 0542) Pulse Rate:  [54-62] 62 (05/05 1039) Resp:  [20] 20 (05/05 0542) BP: (101-126)/(44-61) 126/61 mmHg (05/05 1039) SpO2:  [94 %-95 %] 94 % (05/05 0542) Weight:  [157 lb 8 oz (71.442 kg)] 157 lb 8 oz (71.442 kg) (05/05 0542) Last BM Date: 12/16/13  Intake/Output from previous day: 05/04 0701 - 05/05 0700 In: 240 [P.O.:240] Out: 2000 [Urine:2000] Intake/Output this shift: Total I/O In: 240 [P.O.:240] Out: -   Medications Current Facility-Administered Medications  Medication Dose Route Frequency Provider Last Rate Last Dose  . aspirin EC tablet 81 mg  81 mg Oral Daily Mihai Croitoru, MD   81 mg at 12/20/13 1037  . clopidogrel (PLAVIX) tablet 75 mg  75 mg Oral Daily Mihai Croitoru, MD   75 mg at 12/20/13 1038  . escitalopram (LEXAPRO) tablet 20 mg  20 mg Oral Daily Mihai Croitoru, MD   20 mg at 12/20/13 1038  . estradiol (ESTRACE) vaginal cream 1 Applicatorful  1 Applicatorful Vaginal Weekly Mihai Croitoru, MD      . metoprolol tartrate (LOPRESSOR) tablet 25 mg  25 mg Oral BID Mihai Croitoru, MD   25 mg at 12/20/13 1039  . nitroGLYCERIN (NITROSTAT) SL tablet 0.4 mg  0.4 mg Sublingual Q5 min PRN Mihai Croitoru, MD      . pantoprazole (PROTONIX) EC tablet 40 mg  40 mg Oral Daily Mihai Croitoru, MD   40 mg at 12/20/13 1039  . simvastatin (ZOCOR) tablet 80 mg  80 mg Oral QHS Mihai Croitoru, MD   80 mg at 12/19/13 2104  . traMADol (ULTRAM) tablet 100 mg  100 mg Oral Q8H PRN Sanda Klein, MD   100 mg at 12/19/13 2104  . zolpidem (AMBIEN) tablet 10 mg  10 mg Oral QHS PRN Sanda Klein, MD   10 mg at 12/19/13 2327    PE: HEENT:  normaol Lungs: clear Cor:  Normal S1S2, RR Abd: normal Ext: normal   Lab Results:   Recent Labs  12/19/13 0743  WBC 15.6*  HGB 13.0  HCT 37.7  PLT 231   BMET  Recent  Labs  12/19/13 0746  NA 139  K 4.2  CL 102  CO2 25  GLUCOSE 126*  BUN 12  CREATININE 0.68  CALCIUM 9.3   PT/INR No results found for this basename: LABPROT, INR,  in the last 72 hours Cholesterol No results found for this basename: CHOL,  in the last 72 hours Cardiac Enzymes No components found with this basename: TROPONIN,  CK[facility],   Studies/Results:    Assessment/Plan    Principal Problem: 1.  Chest pain  Cardiac enzymes are negative .  myoview is negative.  Will DC home today.  Follow up wth Dr. Harrington Challenger.   2.  HYPERLIPIDEMIA  On zocor 80 , she may do better on atorvastatin 40 .  Will leave that up to Dr. Harrington Challenger.   3.  TOBACCO USE: I encouraged her to stop  4.  HYPERTENSION  BP well controlled.  On lopressor 25bid.    5.  CORONARY ARTERY DISEASE  On ASA, Plavix  Cont. Home meds     LOS: 1 day    Tarri Fuller PA-C 12/20/2013 11:02 AM

## 2013-12-20 NOTE — Progress Notes (Signed)
UR Completed.  Kemp Gomes Jane Lilah Mijangos [phone] 12/20/2013  

## 2013-12-20 NOTE — Discharge Instructions (Signed)
Cardiac Diet °This diet can help prevent heart disease and stroke. Many factors influence your heart health, including eating and exercise habits. Coronary risk rises a lot with abnormal blood fat (lipid) levels. Cardiac meal planning includes limiting unhealthy fats, increasing healthy fats, and making other small dietary changes. General guidelines are as follows: °· Adjust calorie intake to reach and maintain desirable body weight. °· Limit total fat intake to less than 30% of total calories. Saturated fat should be less than 7% of calories. °· Saturated fats are found in animal products and in some vegetable products. Saturated vegetable fats are found in coconut oil, cocoa butter, palm oil, and palm kernel oil. Read labels carefully to avoid these products as much as possible. Use butter in moderation. Choose tub margarines and oils that have 2 grams of fat or less. Good cooking oils are canola and olive oils. °· Practice low-fat cooking techniques. Do not fry food. Instead, broil, bake, boil, steam, grill, roast on a rack, stir-fry, or microwave it. Other fat reducing suggestions include: °· Remove the skin from poultry. °· Remove all visible fat from meats. °· Skim the fat off stews, soups, and gravies before serving them. °· Steam vegetables in water or broth instead of sautéing them in fat. °· Avoid foods with trans fat (or hydrogenated oils), such as commercially fried foods and commercially baked goods. Commercial shortening and deep-frying fats will contain trans fat. °· Increase intake of fruits, vegetables, whole grains, and legumes to replace foods high in fat. °· Increase consumption of nuts, legumes, and seeds to at least 4 servings weekly. One serving of a legume equals ½ cup, and 1 serving of nuts or seeds equals ¼ cup. °· Choose whole grains more often. Have 3 servings per day (a serving is 1 ounce [oz]). °· Eat 4 to 5 servings of vegetables per day. A serving of vegetables is 1 cup of raw leafy  vegetables; ½ cup of raw or cooked cut-up vegetables; ½ cup of vegetable juice. °· Eat 4 to 5 servings of fruit per day. A serving of fruit is 1 medium whole fruit; ¼ cup of dried fruit; ½ cup of fresh, frozen, or canned fruit; ½ cup of 100% fruit juice. °· Increase your intake of dietary fiber to 20 to 30 grams per day. Insoluble fiber may help lower your risk of heart disease and may help curb your appetite.  °Soluble fiber binds cholesterol to be removed from the blood. Foods high in soluble fiber are dried beans, citrus fruits, oats, apples, bananas, broccoli, Brussels sprouts, and eggplant. °· Try to include foods fortified with plant sterols or stanols, such as yogurt, breads, juices, or margarines. Choose several fortified foods to achieve a daily intake of 2 to 3 grams of plant sterols or stanols. °· Foods with omega-3 fats can help reduce your risk of heart disease. Aim to have a 3.5 oz portion of fatty fish twice per week, such as salmon, mackerel, albacore tuna, sardines, lake trout, or herring. If you wish to take a fish oil supplement, choose one that contains 1 gram of both DHA and EPA. °· Limit processed meats to 2 servings (3 oz portion) weekly. °· Limit the sodium in your diet to 1500 milligrams (mg) per day. If you have high blood pressure, talk to a registered dietitian about a DASH (Dietary Approaches to Stop Hypertension) eating plan. °· Limit sweets and beverages with added sugar, such as soda, to no more than 5 servings per week. One   serving is:   °· 1 tablespoon sugar. °· 1 tablespoon jelly or jam. °· ½ cup sorbet. °· 1 cup lemonade. °· ½ cup regular soda. °CHOOSING FOODS °Starches °· Allowed: Breads: All kinds (wheat, rye, raisin, white, oatmeal, Italian, French, and English muffin bread). Low-fat rolls: English muffins, frankfurter and hamburger buns, bagels, pita bread, tortillas (not fried). Pancakes, waffles, biscuits, and muffins made with recommended oil. °· Avoid: Products made with  saturated or trans fats, oils, or whole milk products. Butter rolls, cheese breads, croissants. Commercial doughnuts, muffins, sweet rolls, biscuits, waffles, pancakes, store-bought mixes. °Crackers °· Allowed: Low-fat crackers and snacks: Animal, graham, rye, saltine (with recommended oil, no lard), oyster, and matzo crackers. Bread sticks, melba toast, rusks, flatbread, pretzels, and light popcorn. °· Avoid: High-fat crackers: cheese crackers, butter crackers, and those made with coconut, palm oil, or trans fat (hydrogenated oils). Buttered popcorn. °Cereals °· Allowed: Hot or cold whole-grain cereals. °· Avoid: Cereals containing coconut, hydrogenated vegetable fat, or animal fat. °Potatoes / Pasta / Rice °· Allowed: All kinds of potatoes, rice, and pasta (such as macaroni, spaghetti, and noodles). °· Avoid: Pasta or rice prepared with cream sauce or high-fat cheese. Chow mein noodles, French fries. °Vegetables °· Allowed: All vegetables and vegetable juices. °· Avoid: Fried vegetables. Vegetables in cream, butter, or high-fat cheese sauces. Limit coconut. Fruit in cream or custard. °Protein °· Allowed: Limit your intake of meat, seafood, and poultry to no more than 6 oz (cooked weight) per day. All lean, well-trimmed beef, veal, pork, and lamb. All chicken and turkey without skin. All fish and shellfish. Wild game: wild duck, rabbit, pheasant, and venison. Egg whites or low-cholesterol egg substitutes may be used as desired. Meatless dishes: recipes with dried beans, peas, lentils, and tofu (soybean curd). Seeds and nuts: all seeds and most nuts. °· Avoid: Prime grade and other heavily marbled and fatty meats, such as short ribs, spare ribs, rib eye roast or steak, frankfurters, sausage, bacon, and high-fat luncheon meats, mutton. Caviar. Commercially fried fish. Domestic duck, goose, venison sausage. Organ meats: liver, gizzard, heart, chitterlings, brains, kidney, sweetbreads. °Dairy °· Allowed: Low-fat  cheeses: nonfat or low-fat cottage cheese (1% or 2% fat), cheeses made with part skim milk, such as mozzarella, farmers, string, or ricotta. (Cheeses should be labeled no more than 2 to 6 grams fat per oz.). Skim (or 1%) milk: liquid, powdered, or evaporated. Buttermilk made with low-fat milk. Drinks made with skim or low-fat milk or cocoa. Chocolate milk or cocoa made with skim or low-fat (1%) milk. Nonfat or low-fat yogurt. °· Avoid: Whole milk cheeses, including colby, cheddar, muenster, Monterey Jack, Havarti, Brie, Camembert, American, Swiss, and blue. Creamed cottage cheese, cream cheese. Whole milk and whole milk products, including buttermilk or yogurt made from whole milk, drinks made from whole milk. Condensed milk, evaporated whole milk, and 2% milk. °Soups and Combination Foods °· Allowed: Low-fat low-sodium soups: broth, dehydrated soups, homemade broth, soups with the fat removed, homemade cream soups made with skim or low-fat milk. Low-fat spaghetti, lasagna, chili, and Spanish rice if low-fat ingredients and low-fat cooking techniques are used. °· Avoid: Cream soups made with whole milk, cream, or high-fat cheese. All other soups. °Desserts and Sweets °· Allowed: Sherbet, fruit ices, gelatins, meringues, and angel food cake. Homemade desserts with recommended fats, oils, and milk products. Jam, jelly, honey, marmalade, sugars, and syrups. Pure sugar candy, such as gum drops, hard candy, jelly beans, marshmallows, mints, and small amounts of dark chocolate. °· Avoid: Commercially prepared   cakes, pies, cookies, frosting, pudding, or mixes for these products. Desserts containing whole milk products, chocolate, coconut, lard, palm oil, or palm kernel oil. Ice cream or ice cream drinks. Candy that contains chocolate, coconut, butter, hydrogenated fat, or unknown ingredients. Buttered syrups. °Fats and Oils °· Allowed: Vegetable oils: safflower, sunflower, corn, soybean, cottonseed, sesame, canola, olive,  or peanut. Non-hydrogenated margarines. Salad dressing or mayonnaise: homemade or commercial, made with a recommended oil. Low or nonfat salad dressing or mayonnaise. °· Limit added fats and oils to 6 to 8 tsp per day (includes fats used in cooking, baking, salads, and spreads on bread). Remember to count the "hidden fats" in foods. °· Avoid: Solid fats and shortenings: butter, lard, salt pork, bacon drippings. Gravy containing meat fat, shortening, or suet. Cocoa butter, coconut. Coconut oil, palm oil, palm kernel oil, or hydrogenated oils: these ingredients are often used in bakery products, nondairy creamers, whipped toppings, candy, and commercially fried foods. Read labels carefully. Salad dressings made of unknown oils, sour cream, or cheese, such as blue cheese and Roquefort. Cream, all kinds: half-and-half, light, heavy, or whipping. Sour cream or cream cheese (even if "light" or low-fat). Nondairy cream substitutes: coffee creamers and sour cream substitutes made with palm, palm kernel, hydrogenated oils, or coconut oil. °Beverages °· Allowed: Coffee (regular or decaffeinated), tea. Diet carbonated beverages, mineral water. Alcohol: Check with your caregiver. Moderation is recommended. °· Avoid: Whole milk, regular sodas, and juice drinks with added sugar. °Condiments °· Allowed: All seasonings and condiments. Cocoa powder. "Cream" sauces made with recommended ingredients. °· Avoid: Carob powder made with hydrogenated fats. °SAMPLE MENU °Breakfast °· ½ cup orange juice °· ½ cup oatmeal °· 1 slice toast °· 1 tsp margarine °· 1 cup skim milk °Lunch °· Turkey sandwich with 2 oz turkey, 2 slices bread °· Lettuce and tomato slices °· Fresh fruit °· Carrot sticks °· Coffee or tea °Snack °· Fresh fruit or low-fat crackers °Dinner °· 3 oz lean ground beef °· 1 baked potato °· 1 tsp margarine °· ½ cup asparagus °· Lettuce salad °· 1 tbs non-creamy dressing °· ½ cup peach slices °· 1 cup skim milk °Document Released:  05/13/2008 Document Revised: 02/03/2012 Document Reviewed: 10/28/2011 °[employer]® Patient Information ©2014 [employer]. ° °

## 2013-12-21 ENCOUNTER — Telehealth: Payer: Self-pay | Admitting: Internal Medicine

## 2013-12-21 NOTE — Telephone Encounter (Signed)
Patient was calling about her anxiety increasing due to her husbands declining health.  Patient requests a prescription for anxiety.  Patient was advised to make a 30 minute appointment.

## 2013-12-21 NOTE — Telephone Encounter (Signed)
[facility] was [facility] yesterday and would like NP to return her call today. Pt decline appt. Pt is having anxiety issues.

## 2013-12-21 NOTE — Telephone Encounter (Signed)
Can we check and see what her concerns are.   PC

## 2013-12-22 ENCOUNTER — Telehealth: Payer: Self-pay | Admitting: Internal Medicine

## 2013-12-22 NOTE — Telephone Encounter (Signed)
Pt said she is not able to keep this med down nitrofurantoin, macrocrystal-monohydrate, (MACROBID) 100 MG capsule pt was told by cardiologist that this comes in a cream and this is what she is requesting

## 2013-12-23 MED ORDER — SULFAMETHOXAZOLE-TMP DS 800-160 MG PO TABS: 1.0000 | ORAL_TABLET | Freq: Two times a day (BID) | ORAL | Status: DC

## 2013-12-23 NOTE — Telephone Encounter (Signed)
Please advise 

## 2013-12-23 NOTE — Telephone Encounter (Signed)
Pt advised Macrobid does not come in a cream. Bactrim sent to pharmacy and Macrobid added to allergy list

## 2013-12-23 NOTE — Telephone Encounter (Signed)
We can consider Bactrim. But Macrobid does not come in a cream.

## 2013-12-28 ENCOUNTER — Encounter: Payer: Self-pay | Admitting: Family

## 2013-12-28 ENCOUNTER — Telehealth: Payer: Self-pay | Admitting: Internal Medicine

## 2013-12-28 ENCOUNTER — Ambulatory Visit (INDEPENDENT_AMBULATORY_CARE_PROVIDER_SITE_OTHER): Payer: Managed Care, Other (non HMO) | Admitting: Family

## 2013-12-28 VITALS — BP 118/70 | HR 67 | Temp 98.1°F | Ht 63.5 in | Wt 155.0 lb

## 2013-12-28 DIAGNOSIS — N390 Urinary tract infection, site not specified: Secondary | ICD-10-CM

## 2013-12-28 DIAGNOSIS — Z1231 Encounter for screening mammogram for malignant neoplasm of breast: Secondary | ICD-10-CM

## 2013-12-28 DIAGNOSIS — N63 Unspecified lump in unspecified breast: Secondary | ICD-10-CM

## 2013-12-28 DIAGNOSIS — R739 Hyperglycemia, unspecified: Secondary | ICD-10-CM

## 2013-12-28 DIAGNOSIS — N6320 Unspecified lump in the left breast, unspecified quadrant: Secondary | ICD-10-CM

## 2013-12-28 DIAGNOSIS — R7309 Other abnormal glucose: Secondary | ICD-10-CM

## 2013-12-28 DIAGNOSIS — F411 Generalized anxiety disorder: Secondary | ICD-10-CM

## 2013-12-28 MED ORDER — TRAMADOL HCL 50 MG PO TABS: 100.0000 mg | ORAL_TABLET | Freq: Three times a day (TID) | ORAL | Status: DC | PRN

## 2013-12-28 MED ORDER — CIPROFLOXACIN HCL 250 MG PO TABS: 250.0000 mg | ORAL_TABLET | Freq: Two times a day (BID) | ORAL | Status: DC

## 2013-12-28 MED ORDER — ZOLPIDEM TARTRATE 10 MG PO TABS: 10.0000 mg | ORAL_TABLET | Freq: Every evening | ORAL | Status: DC | PRN

## 2013-12-28 NOTE — Telephone Encounter (Signed)
Relevant patient education assigned to patient using Emmi. ° °

## 2013-12-28 NOTE — Patient Instructions (Signed)

## 2013-12-28 NOTE — Progress Notes (Signed)
Pre visit review using our clinic review tool, if applicable. No additional management support is needed unless otherwise documented below in the visit note. 

## 2013-12-28 NOTE — Progress Notes (Signed)
Subjective:    Patient ID: Bianca [name], female    DOB: [birth year], 68 y.o.   MRN: [id number]  HPI  68 year old white female, smoker, then today is hospital followup from 12/19/2016 after presenting with chest pain, nausea, vomiting after taking Macrobid the previous day. She had no known allergy to Macrobid. However, has a history of coronary artery disease and has had an MI in the past. She had a negative cardiac workup and was found to have anxiety. She is currently on Lexapro 20 mg and tolerating it well. Patient reports increased stress with her husband being hospitalized with ulcerative colitis.  Patient has concerns about breast masses she noticed that one month ago. She denies any drainage or discharge from her breast. Last mammogram was January 2014 and was normal.   Review of Systems  Constitutional: Negative.   HENT: Negative.   Respiratory: Negative.   Cardiovascular: Negative.   Gastrointestinal: Negative.   Endocrine: Negative.   Genitourinary: Negative.   Musculoskeletal: Negative.   Skin: Negative for color change, rash and wound.       Breast mass, left breast  Neurological: Negative.   Hematological: Negative.   Psychiatric/Behavioral: Positive for sleep disturbance. The patient is nervous/anxious.    Past Medical History  Diagnosis Date  . CAD (coronary artery disease)     a. 1997 MI/PCI RCA;  b. 2007 MI/PCI of 100% RCA with Taxus DES x 3 placed, EF 55%;  c. 2010 Cath: stable anatomy;  d. 05/2012 NSTEMI/Cath/PCI: LM nl, LAD 60-18m, D1 20ost, LCX 56m, RCA 40-36m ISR, 60/95d (Treated w/ 2.75x33 Xience Xpedition DES), PDA 36m (Treated w/ PTCA), EF 60%.  . Depression   . Hyperlipidemia   . Hypertension   . Carpal tunnel syndrome on both sides   . Numbness of foot     "never recovered from last back OR, 2009" (05/17/2012)  . GERD (gastroesophageal reflux disease)   . Arthritis     "back; probably knees" (05/17/2012)  . Chronic lower back pain   . Myocardial  infarction   . Anginal pain     History   Social History  . Marital Status: Married    Spouse Name: N/A    Number of Children: N/A  . Years of Education: N/A   Occupational History  . Not on file.   Social History Main Topics  . Smoking status: Current Every Day Smoker -- 0.50 packs/day for 50 years    Types: Cigarettes  . Smokeless tobacco: Never Used  . Alcohol Use: No  . Drug Use: No  . Sexual Activity: Not Currently   Other Topics Concern  . Not on file   Social History Narrative  . No narrative on file    Past Surgical History  Procedure Laterality Date  . Cesarean section  1990  . Back surgery    . Ankle surgery      left; "had to take out a floater" (05/17/2012)  . Posterior fusion lumbar spine  1962; 2009  . Coronary angioplasty with stent placement  1997; 2007    "2 + 2; total of 4"    Family History  Problem Relation Age of Onset  . Coronary artery disease Mother   . Diabetes Mother   . Hypertension Mother   . Dementia Father   . Sudden death Brother     suicide  . Cancer Paternal Grandfather     esophageal    Allergies  Allergen Reactions  . Oxycodone Hcl Itching and  Other (See Comments)    confusion  . Penicillins Rash  . Macrobid [Nitrofurantoin Monohyd Macro] Nausea And Vomiting  . Sulfa Antibiotics     Current Outpatient Prescriptions on File Prior to Visit  Medication Sig Dispense Refill  . aspirin EC 81 MG tablet Take 81 mg by mouth daily.      . clopidogrel (PLAVIX) 75 MG tablet Take 75 mg by mouth daily.      Marland Kitchen escitalopram (LEXAPRO) 20 MG tablet Take 20 mg by mouth daily.      Marland Kitchen ESTRACE VAGINAL 0.1 MG/GM vaginal cream Place 1 Applicatorful vaginally once a week.       . metoprolol (LOPRESSOR) 50 MG tablet Take 25 mg by mouth 2 (two) times daily.      . nitroGLYCERIN (NITROSTAT) 0.4 MG SL tablet Place 1 tablet (0.4 mg total) under the tongue every 5 (five) minutes as needed. Chest pain  25 tablet  3  . pantoprazole (PROTONIX) 40  MG tablet Take 40 mg by mouth daily.      . simvastatin (ZOCOR) 80 MG tablet Take 1 tablet (80 mg total) by mouth at bedtime.  90 tablet  1   No current facility-administered medications on file prior to visit.    BP 118/70  Pulse 67  Temp(Src) 98.1 F (36.7 C) (Oral)  Ht 5' 3.5" (1.613 m)  Wt 155 lb (70.308 kg)  BMI 27.02 kg/m2chart    Objective:   Physical Exam  Constitutional: She is oriented to person, place, and time. She appears well-developed and well-nourished.  Neck: Normal range of motion. Neck supple.  Cardiovascular: Normal rate, regular rhythm and normal heart sounds.   Pulmonary/Chest: Effort normal and breath sounds normal. Right breast exhibits no inverted nipple, no mass, no nipple discharge, no skin change and no tenderness. Left breast exhibits mass. Left breast exhibits no inverted nipple, no nipple discharge, no skin change and no tenderness. Breasts are symmetrical.    Abdominal: Soft. Bowel sounds are normal.  Musculoskeletal: Normal range of motion.  Neurological: She is alert and oriented to person, place, and time.  Skin: Skin is warm and dry.  Psychiatric: She has a normal mood and affect.          Assessment & Plan:  Bianca [name] was seen today for no specified reason.  Diagnoses and associated orders for this visit:  Generalized anxiety disorder  UTI (urinary tract infection)  Hyperglycemia - Hemoglobin A1c  Left breast mass - MM Digital Diagnostic Unilat L; Future  Screening mammogram for high-risk patient - MM Digital Screening; Future  Other Orders - traMADol (ULTRAM) 50 MG tablet; Take 2 tablets (100 mg total) by mouth every 8 (eight) hours as needed (for pain). - zolpidem (AMBIEN) 10 MG tablet; Take 1 tablet (10 mg total) by mouth at bedtime as needed for sleep. - ciprofloxacin (CIPRO) 250 MG tablet; Take 1 tablet (250 mg total) by mouth 2 (two) times daily.    diagnostic left breast ultrasound ordered. Screening mammogram also  ordered. Continue current medications. Recheck in 4 months and sooner as needed.

## 2013-12-29 ENCOUNTER — Telehealth: Payer: Self-pay

## 2013-12-29 DIAGNOSIS — Z1231 Encounter for screening mammogram for malignant neoplasm of breast: Secondary | ICD-10-CM

## 2013-12-29 DIAGNOSIS — N63 Unspecified lump in unspecified breast: Secondary | ICD-10-CM

## 2013-12-29 NOTE — Telephone Encounter (Signed)
[facility] needs to know where pt's last mammo was done and if it was done at [facility], pt either needs to be referred there or obtain images to be sent to [place].  Spoke with pt and she notes that it was done at [facility] and she prefers going back there. I will change referral to [facility]

## 2014-01-03 ENCOUNTER — Other Ambulatory Visit (INDEPENDENT_AMBULATORY_CARE_PROVIDER_SITE_OTHER): Payer: Managed Care, Other (non HMO)

## 2014-01-03 DIAGNOSIS — Z0000 Encounter for general adult medical examination without abnormal findings: Secondary | ICD-10-CM

## 2014-01-03 LAB — BASIC METABOLIC PANEL
BUN: 10 mg/dL (ref 6–23)
CALCIUM: 9.3 mg/dL (ref 8.4–10.5)
CO2: 26 meq/L (ref 19–32)
CREATININE: 0.6 mg/dL (ref 0.4–1.2)
Chloride: 105 mEq/L (ref 96–112)
GFR: 99.87 mL/min (ref >60.00)
Glucose, Bld: 99 mg/dL (ref 70–99)
Potassium: 3.9 mEq/L (ref 3.5–5.1)
SODIUM: 137 meq/L (ref 135–145)

## 2014-01-03 LAB — HEPATIC FUNCTION PANEL
ALK PHOS: 70 U/L (ref 39–117)
ALT: 12 U/L (ref 0–35)
AST: 19 U/L (ref 0–37)
Albumin: 3.8 g/dL (ref 3.5–5.2)
BILIRUBIN DIRECT: 0.1 mg/dL (ref 0.0–0.3)
Total Bilirubin: 0.6 mg/dL (ref 0.2–1.2)
Total Protein: 7.1 g/dL (ref 6.0–8.3)

## 2014-01-03 LAB — CBC WITH DIFFERENTIAL/PLATELET
BASOS ABS: 0 10*3/uL (ref 0.0–0.1)
Basophils Relative: 0.6 % (ref 0.0–3.0)
Eosinophils Absolute: 0.3 10*3/uL (ref 0.0–0.7)
Eosinophils Relative: 5.3 % — ABNORMAL HIGH (ref 0.0–5.0)
HCT: 37.9 % (ref 36.0–46.0)
HEMOGLOBIN: 13 g/dL (ref 12.0–15.0)
LYMPHS PCT: 40.9 % (ref 12.0–46.0)
Lymphs Abs: 2.2 10*3/uL (ref 0.7–4.0)
MCHC: 34.2 g/dL (ref 30.0–36.0)
MCV: 91.3 fl (ref 78.0–100.0)
MONOS PCT: 8.8 % (ref 3.0–12.0)
Monocytes Absolute: 0.5 10*3/uL (ref 0.1–1.0)
NEUTROS PCT: 44.4 % (ref 43.0–77.0)
Neutro Abs: 2.4 10*3/uL (ref 1.4–7.7)
Platelets: 283 10*3/uL (ref 150.0–400.0)
RBC: 4.15 Mil/uL (ref 3.87–5.11)
RDW: 13 % (ref 11.5–15.5)
WBC: 5.4 10*3/uL (ref 4.0–10.5)

## 2014-01-03 LAB — LIPID PANEL
Cholesterol: 212 mg/dL — ABNORMAL HIGH (ref 0–200)
HDL: 43.5 mg/dL (ref >39.00)
LDL Cholesterol: 106 mg/dL — ABNORMAL HIGH (ref 0–99)
TRIGLYCERIDES: 313 mg/dL — AB (ref 0.0–149.0)
Total CHOL/HDL Ratio: 5
VLDL: 62.6 mg/dL — ABNORMAL HIGH (ref 0.0–40.0)

## 2014-01-03 LAB — POCT URINALYSIS DIPSTICK
Bilirubin, UA: NEGATIVE
GLUCOSE UA: NEGATIVE
KETONES UA: NEGATIVE
LEUKOCYTES UA: NEGATIVE
Nitrite, UA: NEGATIVE
PROTEIN UA: NEGATIVE
Spec Grav, UA: 1.015
Urobilinogen, UA: 0.2
pH, UA: 5.5

## 2014-01-03 LAB — TSH: TSH: 2.75 u[IU]/mL (ref 0.35–4.50)

## 2014-01-05 LAB — HM MAMMOGRAPHY

## 2014-01-10 ENCOUNTER — Ambulatory Visit (INDEPENDENT_AMBULATORY_CARE_PROVIDER_SITE_OTHER): Payer: Managed Care, Other (non HMO) | Admitting: Family

## 2014-01-10 ENCOUNTER — Other Ambulatory Visit (HOSPITAL_COMMUNITY)
Admission: RE | Admit: 2014-01-10 | Discharge: 2014-01-10 | Disposition: A | Discharge status: Home / Self Care | Payer: Managed Care, Other (non HMO) | Source: Ambulatory Visit | Attending: Family | Admitting: Family

## 2014-01-10 ENCOUNTER — Encounter: Payer: Self-pay | Admitting: Family

## 2014-01-10 VITALS — BP 110/60 | HR 61 | Temp 98.3°F | Ht 61.5 in | Wt 157.5 lb

## 2014-01-10 DIAGNOSIS — M129 Arthropathy, unspecified: Secondary | ICD-10-CM

## 2014-01-10 DIAGNOSIS — K219 Gastro-esophageal reflux disease without esophagitis: Secondary | ICD-10-CM

## 2014-01-10 DIAGNOSIS — Z124 Encounter for screening for malignant neoplasm of cervix: Secondary | ICD-10-CM

## 2014-01-10 DIAGNOSIS — E785 Hyperlipidemia, unspecified: Secondary | ICD-10-CM

## 2014-01-10 DIAGNOSIS — Z0000 Encounter for general adult medical examination without abnormal findings: Secondary | ICD-10-CM

## 2014-01-10 DIAGNOSIS — M542 Cervicalgia: Secondary | ICD-10-CM

## 2014-01-10 DIAGNOSIS — F411 Generalized anxiety disorder: Secondary | ICD-10-CM

## 2014-01-10 DIAGNOSIS — M1990 Unspecified osteoarthritis, unspecified site: Secondary | ICD-10-CM

## 2014-01-10 MED ORDER — TRAMADOL HCL 50 MG PO TABS: 100.0000 mg | ORAL_TABLET | Freq: Three times a day (TID) | ORAL | Status: DC | PRN

## 2014-01-10 NOTE — Progress Notes (Signed)
Pre visit review using our clinic review tool, if applicable. No additional management support is needed unless otherwise documented below in the visit note. 

## 2014-01-10 NOTE — Progress Notes (Signed)
Subjective:    Patient ID: [name], female    DOB: 07-31-1946, 68 y.o.   MRN: [id number]  HPI 68 y.o. White female presents today for a yearly physical. Pt has hx significant for depression, hyperlipidemia, hypertension, carpal tunnel syndrome bilaterally, numbness to left foot, GERD, arthritis, chronic lower back pain, myocardial infarction, CAD. Medications were discussed in detail with patient. Pt is up to date on immunizations, yearly blood test, mammogram and yearly eye exams. Lab values were discussed in detail with patient. Chief complaint during today exam is neck pain and left shoulder pain which she rates as a 6 on a scale of 0-10. The pain began 3 weeks ago, the neck pain does not radiate but the shoulder pain radiates down her arm, the pain occurs randomly during the day and is worse at night or with movement; she has used heat to help relieve pain which she states helps "some". Pt denies fever, fatigue, SOB and change in appetite.     Review of Systems  Constitutional: Negative.   HENT: Negative.   Eyes: Negative.   Respiratory: Negative.   Cardiovascular: Negative.   Gastrointestinal: Positive for constipation.       Pt acknowledges occasional hard stool, but has daily bowel movements.   Endocrine: Negative.   Genitourinary: Negative.   Musculoskeletal: Positive for arthralgias, gait problem, neck pain and neck stiffness.       Acknowledges neck pain, left shoulder pain and ongoing hip pain.   Skin: Negative.   Allergic/Immunologic: Negative.   Neurological: Positive for numbness.       To left foot from prior spinal surgery.   Psychiatric/Behavioral: Positive for sleep disturbance. The patient is nervous/anxious.        Acknowledges difficulty sleeping and anxiety due to husbands recent hospitalization.    Past Medical History  Diagnosis Date  . CAD (coronary artery disease)     a. 1997 MI/PCI RCA;  b. 2007 MI/PCI of 100% RCA with Taxus DES x 3 placed, EF 55%;  c.  2010 Cath: stable anatomy;  d. 05/2012 NSTEMI/Cath/PCI: LM nl, LAD 60-82m, D1 20ost, LCX 5m, RCA 40-53m ISR, 60/95d (Treated w/ 2.75x33 Xience Xpedition DES), PDA 33m (Treated w/ PTCA), EF 60%.  . Depression   . Hyperlipidemia   . Hypertension   . Carpal tunnel syndrome on both sides   . Numbness of foot     "never recovered from last back OR, 2009" (05/17/2012)  . GERD (gastroesophageal reflux disease)   . Arthritis     "back; probably knees" (05/17/2012)  . Chronic lower back pain   . Myocardial infarction   . Anginal pain     History   Social History  . Marital Status: Married    Spouse Name: N/A    Number of Children: N/A  . Years of Education: N/A   Occupational History  . Not on file.   Social History Main Topics  . Smoking status: Current Every Day Smoker -- 0.50 packs/day for 50 years    Types: Cigarettes  . Smokeless tobacco: Never Used  . Alcohol Use: No  . Drug Use: No  . Sexual Activity: Not Currently   Other Topics Concern  . Not on file   Social History Narrative  . No narrative on file    Past Surgical History  Procedure Laterality Date  . Cesarean section  1990  . Back surgery    . Ankle surgery      left; "had to take  out a floater" (05/17/2012)  . Posterior fusion lumbar spine  1962; 2009  . Coronary angioplasty with stent placement  1997; 2007    "2 + 2; total of 4"    Family History  Problem Relation Age of Onset  . Coronary artery disease Mother   . Diabetes Mother   . Hypertension Mother   . Dementia Father   . Sudden death Brother     suicide  . Cancer Paternal Grandfather     esophageal    Allergies  Allergen Reactions  . Oxycodone Hcl Itching and Other (See Comments)    confusion  . Penicillins Rash  . Macrobid [Nitrofurantoin Monohyd Macro] Nausea And Vomiting  . Sulfa Antibiotics     Current Outpatient Prescriptions on File Prior to Visit  Medication Sig Dispense Refill  . aspirin EC 81 MG tablet Take 81 mg by mouth  daily.      . clopidogrel (PLAVIX) 75 MG tablet Take 75 mg by mouth daily.      Marland Kitchen escitalopram (LEXAPRO) 20 MG tablet Take 20 mg by mouth daily.      Marland Kitchen ESTRACE VAGINAL 0.1 MG/GM vaginal cream Place 1 Applicatorful vaginally once a week.       . metoprolol (LOPRESSOR) 50 MG tablet Take 25 mg by mouth 2 (two) times daily.      . nitroGLYCERIN (NITROSTAT) 0.4 MG SL tablet Place 1 tablet (0.4 mg total) under the tongue every 5 (five) minutes as needed. Chest pain  25 tablet  3  . pantoprazole (PROTONIX) 40 MG tablet Take 40 mg by mouth daily.      . simvastatin (ZOCOR) 80 MG tablet Take 1 tablet (80 mg total) by mouth at bedtime.  90 tablet  1  . zolpidem (AMBIEN) 10 MG tablet Take 1 tablet (10 mg total) by mouth at bedtime as needed for sleep.  30 tablet  1  . ciprofloxacin (CIPRO) 250 MG tablet Take 1 tablet (250 mg total) by mouth 2 (two) times daily.  6 tablet  0   No current facility-administered medications on file prior to visit.    BP 110/60  Pulse 61  Temp(Src) 98.3 F (36.8 C) (Oral)  Ht 5' 1.5" (1.562 m)  Wt 157 lb 8 oz (71.442 kg)  BMI 29.28 kg/m2  SpO2 95%chart    Objective:   Physical Exam  Constitutional: She is oriented to person, place, and time. Vital signs are normal. She appears well-developed and well-nourished. She is active.  HENT:  Head: Normocephalic.  Right Ear: Tympanic membrane, external ear and ear canal normal.  Left Ear: Tympanic membrane, external ear and ear canal normal.  Nose: Nose normal.  Mouth/Throat: Uvula is midline, oropharynx is clear and moist and mucous membranes are normal.  Eyes: Conjunctivae, EOM and lids are normal. Pupils are equal, round, and reactive to light.  Neck: Trachea normal, normal range of motion and full passive range of motion without pain. Neck supple.  Cardiovascular: Normal rate, regular rhythm, normal heart sounds, intact distal pulses and normal pulses.   Pulmonary/Chest: Effort normal and breath sounds normal.    Abdominal: Soft. Normal appearance and bowel sounds are normal.  Genitourinary: Rectum normal, vagina normal and uterus normal. Guaiac negative stool. No breast swelling, tenderness, discharge or bleeding. Pelvic exam was performed with patient supine.  Musculoskeletal:  Decrease ROM to bilateral wrist. Normal ROM to bilateral lower extremities.   Neurological: She is alert and oriented to person, place, and time. She has normal strength  and normal reflexes. She displays a negative Romberg sign.  Skin: Skin is warm, dry and intact.  Psychiatric: She has a normal mood and affect. Her speech is normal and behavior is normal. Judgment and thought content normal. Cognition and memory are normal.          Assessment & Plan:  68 y.o. Female presents for an annual exam.  - Annual: Discussed lab values including elevated triglyceride, total cholesterol and LDD.   - Pap smear performed along with pelvic exam  - Breast exam normal.  - Rectal exam and occult stool negative.  - Hypertriglyceride, Hyperlipidemia: continue Zocor 80mg .   - Start taking fish oil capsul daily.  - Shoulder pain, Neck pain  - Alieve 2 capsules BID as needed for pain.   - Apply head as needed for pain.  - Hip pain- Continue Tramadol 100mg  as needed .  - Anxiety:  Continue taking lexapro 20mg  Qday  - Ambien 10mg  at night for sleep.  - Gerd: Continue Pantaprazole 40mg  Qday.   Education: Fish oil capsules to help reduce triglycerides. Discussed foods that are heart healthy and will help reduce triglycerides such as a diet high in fruits, vegetables. Start exercising daily, will need to do activities low in impact such as swimming. Decrease soda and increase water intake.   Follow up: as needed.

## 2014-01-10 NOTE — Patient Instructions (Signed)
Hypertriglyceridemia  Diet for High blood levels of Triglycerides Most fats in food are triglycerides. Triglycerides in your blood are stored as fat in your body. High levels of triglycerides in your blood may put you at a greater risk for heart disease and stroke.  Normal triglyceride levels are less than 150 mg/dL. Borderline high levels are 150-199 mg/dl. High levels are 200 - 499 mg/dL, and very high triglyceride levels are greater than 500 mg/dL. The decision to treat high triglycerides is generally based on the level. For people with borderline or high triglyceride levels, treatment includes weight loss and exercise. Drugs are recommended for people with very high triglyceride levels. Many people who need treatment for high triglyceride levels have metabolic syndrome. This syndrome is a collection of disorders that often include: insulin resistance, high blood pressure, blood clotting problems, high cholesterol and triglycerides. TESTING PROCEDURE FOR TRIGLYCERIDES  You should not eat 4 hours before getting your triglycerides measured. The normal range of triglycerides is between 10 and 250 milligrams per deciliter (mg/dl). Some people may have extreme levels (1000 or above), but your triglyceride level may be too high if it is above 150 mg/dl, depending on what other risk factors you have for heart disease.  People with high blood triglycerides may also have high blood cholesterol levels. If you have high blood cholesterol as well as high blood triglycerides, your risk for heart disease is probably greater than if you only had high triglycerides. High blood cholesterol is one of the main risk factors for heart disease. CHANGING YOUR DIET  Your weight can affect your blood triglyceride level. If you are more than 20% above your ideal body weight, you may be able to lower your blood triglycerides by losing weight. Eating less and exercising regularly is the best way to combat this. Fat provides more  calories than any other food. The best way to lose weight is to eat less fat. Only 30% of your total calories should come from fat. Less than 7% of your diet should come from saturated fat. A diet low in fat and saturated fat is the same as a diet to decrease blood cholesterol. By eating a diet lower in fat, you may lose weight, lower your blood cholesterol, and lower your blood triglyceride level.  Eating a diet low in fat, especially saturated fat, may also help you lower your blood triglyceride level. Ask your dietitian to help you figure how much fat you can eat based on the number of calories your caregiver has prescribed for you.  Exercise, in addition to helping with weight loss may also help lower triglyceride levels.   Alcohol can increase blood triglycerides. You may need to stop drinking alcoholic beverages.  Too much carbohydrate in your diet may also increase your blood triglycerides. Some complex carbohydrates are necessary in your diet. These may include bread, rice, potatoes, other starchy vegetables and cereals.  Reduce "simple" carbohydrates. These may include pure sugars, candy, honey, and jelly without losing other nutrients. If you have the kind of high blood triglycerides that is affected by the amount of carbohydrates in your diet, you will need to eat less sugar and less high-sugar foods. Your caregiver can help you with this.  Adding 2-4 grams of fish oil (EPA+ DHA) may also help lower triglycerides. Speak with your caregiver before adding any supplements to your regimen. Following the Diet  Maintain your ideal weight. Your caregivers can help you with a diet. Generally, eating less food and getting more   exercise will help you lose weight. Joining a weight control group may also help. Ask your caregivers for a good weight control group in your area.  Eat low-fat foods instead of high-fat foods. This can help you lose weight too.  These foods are lower in fat. Eat MORE of these:    Dried beans, peas, and lentils.  Egg whites.  Low-fat cottage cheese.  Fish.  Lean cuts of meat, such as round, sirloin, rump, and flank (cut extra fat off meat you fix).  Whole grain breads, cereals and pasta.  Skim and nonfat dry milk.  Low-fat yogurt.  Poultry without the skin.  Cheese made with skim or part-skim milk, such as mozzarella, parmesan, farmers', ricotta, or pot cheese. These are higher fat foods. Eat LESS of these:   Whole milk and foods made from whole milk, such as American, blue, cheddar, monterey jack, and swiss cheese  High-fat meats, such as luncheon meats, sausages, knockwurst, bratwurst, hot dogs, ribs, corned beef, ground pork, and regular ground beef.  Fried foods. Limit saturated fats in your diet. Substituting unsaturated fat for saturated fat may decrease your blood triglyceride level. You will need to read package labels to know which products contain saturated fats.  These foods are high in saturated fat. Eat LESS of these:   Fried pork skins.  Whole milk.  Skin and fat from poultry.  Palm oil.  Butter.  Shortening.  Cream cheese.  Bacon.  Margarines and baked goods made from listed oils.  Vegetable shortenings.  Chitterlings.  Fat from meats.  Coconut oil.  Palm kernel oil.  Lard.  Cream.  Sour cream.  Fatback.  Coffee whiteners and non-dairy creamers made with these oils.  Cheese made from whole milk. Use unsaturated fats (both polyunsaturated and monounsaturated) moderately. Remember, even though unsaturated fats are better than saturated fats; you still want a diet low in total fat.  These foods are high in unsaturated fat:   Canola oil.  Sunflower oil.  Mayonnaise.  Almonds.  Peanuts.  Pine nuts.  Margarines made with these oils.  Safflower oil.  Olive oil.  Avocados.  Cashews.  Peanut butter.  Sunflower seeds.  Soybean oil.  Peanut  oil.  Olives.  Pecans.  Walnuts.  Pumpkin seeds. Avoid sugar and other high-sugar foods. This will decrease carbohydrates without decreasing other nutrients. Sugar in your food goes rapidly to your blood. When there is excess sugar in your blood, your liver may use it to make more triglycerides. Sugar also contains calories without other important nutrients.  Eat LESS of these:   Sugar, brown sugar, powdered sugar, jam, jelly, preserves, honey, syrup, molasses, pies, candy, cakes, cookies, frosting, pastries, colas, soft drinks, punches, fruit drinks, and regular gelatin.  Avoid alcohol. Alcohol, even more than sugar, may increase blood triglycerides. In addition, alcohol is high in calories and low in nutrients. Ask for sparkling water, or a diet soft drink instead of an alcoholic beverage. Suggestions for planning and preparing meals   Bake, broil, grill or roast meats instead of frying.  Remove fat from meats and skin from poultry before cooking.  Add spices, herbs, lemon juice or vinegar to vegetables instead of salt, rich sauces or gravies.  Use a non-stick skillet without fat or use no-stick sprays.  Cool and refrigerate stews and broth. Then remove the hardened fat floating on the surface before serving.  Refrigerate meat drippings and skim off fat to make low-fat gravies.  Serve more fish.  Use less butter,   margarine and other high-fat spreads on bread or vegetables.  Use skim or reconstituted non-fat dry milk for cooking.  Cook with low-fat cheeses.  Substitute low-fat yogurt or cottage cheese for all or part of the sour cream in recipes for sauces, dips or congealed salads.  Use half yogurt/half mayonnaise in salad recipes.  Substitute evaporated skim milk for cream. Evaporated skim milk or reconstituted non-fat dry milk can be whipped and substituted for whipped cream in certain recipes.  Choose fresh fruits for dessert instead of high-fat foods such as pies or  cakes. Fruits are naturally low in fat. When Dining Out   Order low-fat appetizers such as fruit or vegetable juice, pasta with vegetables or tomato sauce.  Select clear, rather than cream soups.  Ask that dressings and gravies be served on the side. Then use less of them.  Order foods that are baked, broiled, poached, steamed, stir-fried, or roasted.  Ask for margarine instead of butter, and use only a small amount.  Drink sparkling water, unsweetened tea or coffee, or diet soft drinks instead of alcohol or other sweet beverages. QUESTIONS AND ANSWERS ABOUT OTHER FATS IN THE BLOOD: SATURATED FAT, TRANS FAT, AND CHOLESTEROL What is trans fat? Trans fat is a type of fat that is formed when vegetable oil is hardened through a process called hydrogenation. This process helps makes foods more solid, gives them shape, and prolongs their shelf life. Trans fats are also called hydrogenated or partially hydrogenated oils.  What do saturated fat, trans fat, and cholesterol in foods have to do with heart disease? Saturated fat, trans fat, and cholesterol in the diet all raise the level of LDL "bad" cholesterol in the blood. The higher the LDL cholesterol, the greater the risk for coronary heart disease (CHD). Saturated fat and trans fat raise LDL similarly.  What foods contain saturated fat, trans fat, and cholesterol? High amounts of saturated fat are found in animal products, such as fatty cuts of meat, chicken skin, and full-fat dairy products like butter, whole milk, cream, and cheese, and in tropical vegetable oils such as palm, palm kernel, and coconut oil. Trans fat is found in some of the same foods as saturated fat, such as vegetable shortening, some margarines (especially hard or stick margarine), crackers, cookies, baked goods, fried foods, salad dressings, and other processed foods made with partially hydrogenated vegetable oils. Small amounts of trans fat also occur naturally in some animal  products, such as milk products, beef, and lamb. Foods high in cholesterol include liver, other organ meats, egg yolks, shrimp, and full-fat dairy products. How can I use the new food label to make heart-healthy food choices? Check the Nutrition Facts panel of the food label. Choose foods lower in saturated fat, trans fat, and cholesterol. For saturated fat and cholesterol, you can also use the Percent Daily Value (%DV): 5% DV or less is low, and 20% DV or more is high. (There is no %DV for trans fat.) Use the Nutrition Facts panel to choose foods low in saturated fat and cholesterol, and if the trans fat is not listed, read the ingredients and limit products that list shortening or hydrogenated or partially hydrogenated vegetable oil, which tend to be high in trans fat. POINTS TO REMEMBER:   Discuss your risk for heart disease with your caregivers, and take steps to reduce risk factors.  Change your diet. Choose foods that are low in saturated fat, trans fat, and cholesterol.  Add exercise to your daily routine if   it is not already being done. Participate in physical activity of moderate intensity, like brisk walking, for at least 30 minutes on most, and preferably all days of the week. No time? Break the 30 minutes into three, 10-minute segments during the day.  Stop smoking. If you do smoke, contact your caregiver to discuss ways in which they can help you quit.  Do not use street drugs.  Maintain a normal weight.  Maintain a healthy blood pressure.  Keep up with your blood work for checking the fats in your blood as directed by your caregiver. Document Released: 05/22/2004 Document Revised: 02/03/2012 Document Reviewed: 12/18/2008 [employer] Patient Information 2014 [place]. Fish Oil, Omega-3 Fatty Acids capsules (OTC) What is this medicine? FISH OIL, OMEGA-3 FATTY ACIDS (Fish Oil, oh MAY ga - 3 fatty AS ids) are essential fats. It is promoted to help support a healthy heart. This  dietary supplement is used to add to a healthy diet. The FDA has not approved this supplement for any medical use. This supplement may be used for other purposes; ask your health care provider or pharmacist if you have questions. This medicine may be used for other purposes; ask your health care provider or pharmacist if you have questions. COMMON BRAND NAME(S): [employer] Omega-3 1450, Ocean Blue Omega, [employer] Omega-3 2100, Omega-3 Astrid Drafts , Omega-3, Sea-Omega, TherOmega  What should I tell my health care provider before I take this medicine? They need to know if you have any of these conditions -bleeding problems -lung or breathing disease, like asthma -an unusual or allergic reaction to fish oil, omega-3 fatty acids, fish, other medicines, foods, dyes, or preservatives -pregnant or trying to get pregnant -breast-feeding How should I use this medicine? Take this medicine by mouth with a glass of water. Follow the directions on the package or prescription label. Take with food. Take your medicine at regular intervals. Do not take your medicine more often than directed. Talk to your pediatrician regarding the use of this medicine in children. Special care may be needed. This medicine should not be used in children without a doctor's advice. Overdosage: If you think you have taken too much of this medicine contact a poison control center or emergency room at once. NOTE: This medicine is only for you. Do not share this medicine with others. What if I miss a dose? If you miss a dose, take it as soon as you can. If it is almost time for your next dose, take only that dose. Do not take double or extra doses. What may interact with this medicine? -aspirin and aspirin-like medicines -herbal products like danshen, dong quai, garlic pills, ginger, ginkgo biloba, horse chestnut, willow bark, and others -medicines that treat or prevent blood clots like enoxaparin, heparin,  warfarin This list may not describe all possible interactions. Give your health care provider a list of all the medicines, herbs, non-prescription drugs, or dietary supplements you use. Also tell them if you smoke, drink alcohol, or use illegal drugs. Some items may interact with your medicine. What should I watch for while using this medicine? Follow a good diet and exercise plan. Taking a dietary supplement does not replace a healthy lifestyle. Some foods that have omega-3 fatty acids naturally are fatty fish like albacore tuna, halibut, herring, mackerel, lake trout, salmon, and sardines. Too much of this supplement can be unsafe. Talk to your doctor or health care provider about how much of this supplement is right for you. If  you are scheduled for any medical or dental procedure, tell your healthcare provider that you are taking this medicine. You may need to stop taking this medicine before the procedure. Herbal or dietary supplements are not regulated like medicines. Rigid quality control standards are not required for dietary supplements. The purity and strength of these products can vary. The safety and effect of this dietary supplement for a certain disease or illness is not well known. This product is not intended to diagnose, treat, cure or prevent any disease. The Food and Drug Administration suggests the following to help consumers protect themselves: -Always read product labels and follow directions. -Natural does not mean a product is safe for humans to take. -Look for products that include USP after the ingredient name. This means that the manufacturer followed the standards of the [place] Pharmacopoeia. -Supplements made or sold by a nationally known food or drug company are more likely to be made under tight controls. You can write to the company for more information about how the product was made. What side effects may I notice from receiving this medicine? Side effects that you should  report to your doctor or health care professional as soon as possible: -allergic reactions like skin rash, itching or hives, swelling of the face, lips, or tongue -breathing problems -changes in your moods or emotions -unusual bleeding or bruising Side effects that usually do not require medical attention (report to your doctor or health care professional if they continue or are bothersome): -bad or fishy breath -belching -diarrhea -nausea -stomach gas, upset -weight gain This list may not describe all possible side effects. Call your doctor for medical advice about side effects. You may report side effects to FDA at 1-800-FDA-1088. Where should I keep my medicine? Keep out of the reach of children. Store at room temperature or as directed on the package label. Protect from moisture. Do not freeze. Throw away any unused medicine after the expiration date. NOTE: This sheet is a summary. It may not cover all possible information. If you have questions about this medicine, talk to your doctor, pharmacist, or health care provider.  2014, [employer]vier/Gold Standard. (2007-10-21 13:05:24)

## 2014-01-10 NOTE — Addendum Note (Signed)
Addended byRoxy Cedar B on: 01/10/2014 10:24 AM   Modules accepted: Orders

## 2014-01-23 ENCOUNTER — Encounter: Payer: Managed Care, Other (non HMO) | Admitting: Internal Medicine

## 2014-01-24 ENCOUNTER — Encounter: Payer: Self-pay | Admitting: Family

## 2014-02-16 ENCOUNTER — Telehealth: Payer: Self-pay | Admitting: *Deleted

## 2014-02-16 NOTE — Telephone Encounter (Signed)
Signed clearance form placed in medical records bin to be faxed.

## 2014-02-20 ENCOUNTER — Telehealth: Payer: Self-pay | Admitting: Internal Medicine

## 2014-02-20 NOTE — Telephone Encounter (Signed)
Received request from Nurse fax box, documents faxed for surgical clearance. To: Rodena Piety Fax number: [phone] Attention: [phone]

## 2014-03-07 ENCOUNTER — Other Ambulatory Visit: Payer: Self-pay | Admitting: Family

## 2014-03-08 ENCOUNTER — Other Ambulatory Visit: Payer: Self-pay | Admitting: Family

## 2014-03-09 ENCOUNTER — Other Ambulatory Visit: Payer: Self-pay | Admitting: Family

## 2014-03-14 ENCOUNTER — Encounter: Payer: Self-pay | Admitting: Family

## 2014-03-14 ENCOUNTER — Encounter: Payer: Self-pay | Admitting: Internal Medicine

## 2014-03-14 ENCOUNTER — Ambulatory Visit (INDEPENDENT_AMBULATORY_CARE_PROVIDER_SITE_OTHER): Payer: Managed Care, Other (non HMO) | Admitting: Family

## 2014-03-14 VITALS — BP 112/62 | HR 59 | Ht 61.5 in | Wt 157.0 lb

## 2014-03-14 DIAGNOSIS — K1230 Oral mucositis (ulcerative), unspecified: Principal | ICD-10-CM

## 2014-03-14 DIAGNOSIS — J029 Acute pharyngitis, unspecified: Secondary | ICD-10-CM

## 2014-03-14 DIAGNOSIS — I2510 Atherosclerotic heart disease of native coronary artery without angina pectoris: Secondary | ICD-10-CM

## 2014-03-14 DIAGNOSIS — K121 Other forms of stomatitis: Secondary | ICD-10-CM

## 2014-03-14 MED ORDER — MAGIC MOUTHWASH W/LIDOCAINE: 5.0000 mL | Freq: Four times a day (QID) | ORAL | Status: DC | PRN

## 2014-03-14 NOTE — Progress Notes (Signed)
Pre visit review using our clinic review tool, if applicable. No additional management support is needed unless otherwise documented below in the visit note. 

## 2014-03-14 NOTE — Patient Instructions (Signed)
Stomatitis °Stomatitis is an inflammation of the mucous lining of the mouth. It can affect part of the mouth or the whole mouth. The intensity of symptoms can range from mild to severe. It can affect your cheek, teeth, gums, lips, or tongue. In almost all cases, the lining of the mouth becomes swollen, red, and painful. Painful ulcers can develop in your mouth. Stomatitis recurs in some people. °CAUSES  °There are many common causes of stomatitis. They include: °· Viruses (such as cold sores or shingles). °· Canker sores. °· Bacteria (such as ulcerative gingivitis or sexually transmitted diseases). °· Fungus or yeast (such as candidiasis or oral thrush). °· Poor oral hygiene and poor nutrition (Vincent's stomatitis or trench mouth). °· Lack of vitamin B, vitamin C, or niacin. °· Dentures or braces that do not fit properly. °· High acid foods (uncommon). °· Sharp or broken teeth. °· Cheek biting. °· Breathing through the mouth. °· Chewing tobacco. °· Allergy to toothpaste, mouthwash, candy, gum, lipstick, or some medicines. °· Burning your mouth with hot drinks or food. °· Exposure to dyes, heavy metals, acid fumes, or mineral dust. °SYMPTOMS  °· Painful ulcers in the mouth. °· Blisters in the mouth. °· Bleeding gums. °· Swollen gums. °· Irritability. °· Bad breath. °· Bad taste in the mouth. °· Fever. °· Trouble eating because of burning and pain in the mouth. °DIAGNOSIS  °Your caregiver will examine your mouth and look for bleeding gums and mouth ulcers. Your caregiver may ask you about the medicines you are taking. Your caregiver may suggest a blood test and tissue sample (biopsy) of the mouth ulcer or mass if either is present. This will help find the cause of your condition. °TREATMENT  °Your treatment will depend on the cause of your condition. Your caregiver will first try to treat your symptoms.  °· You may be given pain medicine. Topical anesthetic may be used to numb the area if you have severe  pain. °· Your caregiver may prescribe antibiotic medicine if you have a bacterial infection. °· Your caregiver may prescribe antifungal medicine if you have a fungal infection. °· You may need to take antiviral medicine if you have a viral infection like herpes. °· You may be asked to use medicated mouth rinses. °· Your caregiver will advise you about proper brushing and using a soft toothbrush. You also need to get your teeth cleaned regularly. °HOME CARE INSTRUCTIONS  °· Maintain good oral hygiene. This is especially important for transplant patients. °· Brush your teeth carefully with a soft, nylon-bristled toothbrush. °· Floss at least 2 times a day. °· Clean your mouth after eating. °· Rinse your mouth with salt water 3 to 4 times a day. °· Gargle with cold water. °· Use topical numbing medicines to decrease pain if recommended by your caregiver. °· Stop smoking, and stop using chewing or smokeless tobacco. °· Avoid eating hot and spicy foods. °· Eat soft and bland food. °· Reduce your stress wherever possible. °· Eat healthy and nutritious foods. °SEEK MEDICAL CARE IF:  °· Your symptoms persist or get worse. °· You develop new symptoms. °· Your mouth ulcers are present for more than 3 weeks. °· Your mouth ulcers come back frequently. °· You have increasing difficulty with normal eating and drinking. °· You have increasing fatigue or weakness. °· You develop loss of appetite or nausea. °SEEK IMMEDIATE MEDICAL CARE IF:  °· You have a fever. °· You develop pain, redness, or sores around one or both   eyes. °· You cannot eat or drink because of pain or other symptoms. °· You develop worsening weakness, or you faint. °· You develop vomiting or diarrhea. °· You develop chest pain, shortness of breath, or rapid and irregular heartbeats. °MAKE SURE YOU: °· Understand these instructions. °· Will watch your condition. °· Will get help right away if you are not doing well or get worse. °Document Released: 06/01/2007  Document Revised: 10/27/2011 Document Reviewed: 03/13/2011 °Exit[employer]® Patient Information ©2015 [employer]. This information is not intended to replace advice given to you by your health care provider. Make sure you discuss any questions you have with your health care provider. ° °

## 2014-03-15 LAB — VITAMIN B12: Vitamin B-12: 94 pg/mL — ABNORMAL LOW (ref 211–911)

## 2014-03-15 NOTE — Progress Notes (Signed)
Subjective:    Patient ID: Bianca [name], female    DOB: 18-May-1946, 68 y.o.   MRN: [id number]  HPI 68 year old white female, nonsmoker is in today with concerns of burning of her tongue, mouth and throat ongoing x5 months but worsening over the last 2 days. It is particularly worse with acidic foods. Has not used anything for relief at this point. No history of any anemias.   Review of Systems  Constitutional: Negative.   HENT: Positive for sore throat.        Sore tongue and redness  Respiratory: Negative.   Cardiovascular: Negative.   Musculoskeletal: Negative.   Skin: Negative.   Allergic/Immunologic: Negative.   Psychiatric/Behavioral: Negative.    Past Medical History  Diagnosis Date  . CAD (coronary artery disease)     a. 1997 MI/PCI RCA;  b. 2007 MI/PCI of 100% RCA with Taxus DES x 3 placed, EF 55%;  c. 2010 Cath: stable anatomy;  d. 05/2012 NSTEMI/Cath/PCI: LM nl, LAD 60-72m, D1 20ost, LCX 4m, RCA 40-80m ISR, 60/95d (Treated w/ 2.75x33 Xience Xpedition DES), PDA 97m (Treated w/ PTCA), EF 60%.  . Depression   . Hyperlipidemia   . Hypertension   . Carpal tunnel syndrome on both sides   . Numbness of foot     "never recovered from last back OR, 2009" (05/17/2012)  . GERD (gastroesophageal reflux disease)   . Arthritis     "back; probably knees" (05/17/2012)  . Chronic lower back pain   . Myocardial infarction   . Anginal pain     History   Social History  . Marital Status: Married    Spouse Name: N/A    Number of Children: N/A  . Years of Education: N/A   Occupational History  . Not on file.   Social History Main Topics  . Smoking status: Current Every Day Smoker -- 0.50 packs/day for 50 years    Types: Cigarettes  . Smokeless tobacco: Never Used  . Alcohol Use: No  . Drug Use: No  . Sexual Activity: Not Currently   Other Topics Concern  . Not on file   Social History Narrative  . No narrative on file    Past Surgical History  Procedure  Laterality Date  . Cesarean section  1990  . Back surgery    . Ankle surgery      left; "had to take out a floater" (05/17/2012)  . Posterior fusion lumbar spine  1962; 2009  . Coronary angioplasty with stent placement  1997; 2007    "2 + 2; total of 4"    Family History  Problem Relation Age of Onset  . Coronary artery disease Mother   . Diabetes Mother   . Hypertension Mother   . Dementia Father   . Sudden death Brother     suicide  . Cancer Paternal Grandfather     esophageal    Allergies  Allergen Reactions  . Oxycodone Hcl Itching and Other (See Comments)    confusion  . Penicillins Rash  . Macrobid [Nitrofurantoin Monohyd Macro] Nausea And Vomiting  . Sulfa Antibiotics     Current Outpatient Prescriptions on File Prior to Visit  Medication Sig Dispense Refill  . aspirin EC 81 MG tablet Take 81 mg by mouth daily.      . clopidogrel (PLAVIX) 75 MG tablet Take 75 mg by mouth daily.      Marland Kitchen escitalopram (LEXAPRO) 20 MG tablet Take 20 mg by mouth daily.      Marland Kitchen  ESTRACE VAGINAL 0.1 MG/GM vaginal cream Place 1 Applicatorful vaginally once a week.       . metoprolol (LOPRESSOR) 50 MG tablet Take 25 mg by mouth 2 (two) times daily.      . nitroGLYCERIN (NITROSTAT) 0.4 MG SL tablet Place 1 tablet (0.4 mg total) under the tongue every 5 (five) minutes as needed. Chest pain  25 tablet  3  . pantoprazole (PROTONIX) 40 MG tablet Take 40 mg by mouth daily.      . simvastatin (ZOCOR) 80 MG tablet Take 1 tablet (80 mg total) by mouth at bedtime.  90 tablet  1  . traMADol (ULTRAM) 50 MG tablet TAKE 2 TABLETS BY MOUTH EVERY 8 HOURS AS NEEDED FOR PAIN  180 tablet  1  . zolpidem (AMBIEN) 10 MG tablet TAKE 1 TABLET AT BEDTIME AS NEEDED FOR SLEEP  30 tablet  1   No current facility-administered medications on file prior to visit.    BP 112/62  Pulse 59  Ht 5' 1.5" (1.562 m)  Wt 157 lb (71.215 kg)  BMI 29.19 kg/m2chart    Objective:   Physical Exam  Constitutional: She is oriented  to person, place, and time. She appears well-developed and well-nourished.  HENT:  Right Ear: External ear normal.  Left Ear: External ear normal.  Nose: Nose normal.  Beefy red tongue and mucous membranes inside the mouth. Tender to touch. No candida  Neck: Normal range of motion. Neck supple.  Cardiovascular: Normal rate, regular rhythm and normal heart sounds.   Pulmonary/Chest: Effort normal and breath sounds normal.  Neurological: She is alert and oriented to person, place, and time.  Skin: Skin is warm and dry.  Psychiatric: She has a normal mood and affect.          Assessment & Plan:  Rossi was seen today for tongue burning.  Diagnoses and associated orders for this visit:  Stomatitis and mucositis - Vitamin B1 - Vitamin B12  Acute pharyngitis, unspecified pharyngitis type  Other Orders - Alum & Mag Hydroxide-Simeth (MAGIC MOUTHWASH W/LIDOCAINE) SOLN; Take 5 mLs by mouth 4 (four) times daily as needed for mouth pain.     Call the office with any questions or concerns. Recheck as needed and as scheduled. Will notify her pending labs

## 2014-03-18 LAB — VITAMIN B1: VITAMIN B1 (THIAMINE): 9 nmol/L (ref 8–30)

## 2014-03-20 ENCOUNTER — Ambulatory Visit (INDEPENDENT_AMBULATORY_CARE_PROVIDER_SITE_OTHER): Payer: Managed Care, Other (non HMO)

## 2014-03-20 DIAGNOSIS — E538 Deficiency of other specified B group vitamins: Secondary | ICD-10-CM

## 2014-03-20 MED ORDER — CYANOCOBALAMIN 1000 MCG/ML IJ SOLN
1000.0000 ug | Freq: Once | INTRAMUSCULAR | Status: AC
  Administered 2014-03-20: 1000 ug via INTRAMUSCULAR

## 2014-04-18 ENCOUNTER — Encounter: Payer: Self-pay | Admitting: Gastroenterology

## 2014-04-19 ENCOUNTER — Encounter: Payer: Self-pay | Admitting: Family

## 2014-04-19 ENCOUNTER — Ambulatory Visit (INDEPENDENT_AMBULATORY_CARE_PROVIDER_SITE_OTHER): Payer: Managed Care, Other (non HMO) | Admitting: Family

## 2014-04-19 VITALS — BP 120/60 | HR 76 | Temp 98.7°F | Ht 61.5 in | Wt 152.0 lb

## 2014-04-19 DIAGNOSIS — I10 Essential (primary) hypertension: Secondary | ICD-10-CM

## 2014-04-19 DIAGNOSIS — J0100 Acute maxillary sinusitis, unspecified: Secondary | ICD-10-CM

## 2014-04-19 DIAGNOSIS — D510 Vitamin B12 deficiency anemia due to intrinsic factor deficiency: Secondary | ICD-10-CM

## 2014-04-19 MED ORDER — AZITHROMYCIN 250 MG PO TABS: ORAL_TABLET | ORAL | Status: DC

## 2014-04-19 MED ORDER — FLUTICASONE PROPIONATE 50 MCG/ACT NA SUSP: 2.0000 | Freq: Every day | NASAL | Status: DC

## 2014-04-19 MED ORDER — CYANOCOBALAMIN 1000 MCG/ML IJ SOLN
1000.0000 ug | INTRAMUSCULAR | Status: DC
  Administered 2014-04-19: 1000 ug via INTRAMUSCULAR

## 2014-04-19 NOTE — Progress Notes (Signed)
Subjective:    Patient ID: Bianca [name], female    DOB: 1945-11-15, 68 y.o.   MRN: [id number]  HPI  68 year old white female, nonsmoker with a history of pernicious anemia, hypertension, hypercholesterolemia is in today with complaints of cough, congestion, sinus pressure, sinus pain and pain in her teeth x11 days and worsening. Has been taking over-the-counter Mucinex and use a nasal saline without much relief.  Review of Systems  Constitutional: Positive for fever and chills.  HENT: Positive for congestion, postnasal drip, rhinorrhea and sinus pressure.   Respiratory: Negative.   Cardiovascular: Negative.   Genitourinary: Negative.   Musculoskeletal: Negative.   Skin: Negative.   Allergic/Immunologic: Negative.   Psychiatric/Behavioral: Negative.   All other systems reviewed and are negative.  Past Medical History  Diagnosis Date  . CAD (coronary artery disease)     a. 1997 MI/PCI RCA;  b. 2007 MI/PCI of 100% RCA with Taxus DES x 3 placed, EF 55%;  c. 2010 Cath: stable anatomy;  d. 05/2012 NSTEMI/Cath/PCI: LM nl, LAD 60-72m, D1 20ost, LCX 63m, RCA 40-32m ISR, 60/95d (Treated w/ 2.75x33 Xience Xpedition DES), PDA 3m (Treated w/ PTCA), EF 60%.  . Depression   . Hyperlipidemia   . Hypertension   . Carpal tunnel syndrome on both sides   . Numbness of foot     "never recovered from last back OR, 2009" (05/17/2012)  . GERD (gastroesophageal reflux disease)   . Arthritis     "back; probably knees" (05/17/2012)  . Chronic lower back pain   . Myocardial infarction   . Anginal pain     History   Social History  . Marital Status: Married    Spouse Name: N/A    Number of Children: N/A  . Years of Education: N/A   Occupational History  . Not on file.   Social History Main Topics  . Smoking status: Current Every Day Smoker -- 0.50 packs/day for 50 years    Types: Cigarettes  . Smokeless tobacco: Never Used  . Alcohol Use: No  . Drug Use: No  . Sexual Activity: Not  Currently   Other Topics Concern  . Not on file   Social History Narrative  . No narrative on file    Past Surgical History  Procedure Laterality Date  . Cesarean section  1990  . Back surgery    . Ankle surgery      left; "had to take out a floater" (05/17/2012)  . Posterior fusion lumbar spine  1962; 2009  . Coronary angioplasty with stent placement  1997; 2007    "2 + 2; total of 4"    Family History  Problem Relation Age of Onset  . Coronary artery disease Mother   . Diabetes Mother   . Hypertension Mother   . Dementia Father   . Sudden death Brother     suicide  . Cancer Paternal Grandfather     esophageal    Allergies  Allergen Reactions  . Oxycodone Hcl Itching and Other (See Comments)    confusion  . Penicillins Rash  . Macrobid [Nitrofurantoin Monohyd Macro] Nausea And Vomiting  . Sulfa Antibiotics     Current Outpatient Prescriptions on File Prior to Visit  Medication Sig Dispense Refill  . Alum & Mag Hydroxide-Simeth (MAGIC MOUTHWASH W/LIDOCAINE) SOLN Take 5 mLs by mouth 4 (four) times daily as needed for mouth pain.  120 mL  0  . aspirin EC 81 MG tablet Take 81 mg by mouth daily.      Marland Kitchen  clopidogrel (PLAVIX) 75 MG tablet Take 75 mg by mouth daily.      Marland Kitchen escitalopram (LEXAPRO) 20 MG tablet Take 20 mg by mouth daily.      Marland Kitchen ESTRACE VAGINAL 0.1 MG/GM vaginal cream Place 1 Applicatorful vaginally once a week.       . metoprolol (LOPRESSOR) 50 MG tablet Take 25 mg by mouth 2 (two) times daily.      . nitroGLYCERIN (NITROSTAT) 0.4 MG SL tablet Place 1 tablet (0.4 mg total) under the tongue every 5 (five) minutes as needed. Chest pain  25 tablet  3  . pantoprazole (PROTONIX) 40 MG tablet Take 40 mg by mouth daily.      . simvastatin (ZOCOR) 80 MG tablet Take 1 tablet (80 mg total) by mouth at bedtime.  90 tablet  1  . traMADol (ULTRAM) 50 MG tablet TAKE 2 TABLETS BY MOUTH EVERY 8 HOURS AS NEEDED FOR PAIN  180 tablet  1  . zolpidem (AMBIEN) 10 MG tablet TAKE 1  TABLET AT BEDTIME AS NEEDED FOR SLEEP  30 tablet  1   No current facility-administered medications on file prior to visit.    BP 120/60  Pulse 76  Temp(Src) 98.7 F (37.1 C) (Oral)  Ht 5' 1.5" (1.562 m)  Wt 152 lb (68.947 kg)  BMI 28.26 kg/m2chart    Objective:   Physical Exam  Constitutional: She is oriented to person, place, and time. She appears well-developed and well-nourished.  HENT:  Right Ear: External ear normal.  Left Ear: External ear normal.  Maxillary and frontal sinus tenderness to palpation  Neck: Normal range of motion. Neck supple.  Cardiovascular: Normal rate, regular rhythm and normal heart sounds.   Pulmonary/Chest: Effort normal and breath sounds normal.  Musculoskeletal: Normal range of motion.  Neurological: She is alert and oriented to person, place, and time.  Skin: Skin is warm and dry.  Psychiatric: She has a normal mood and affect.          Assessment & Plan:  Lia was seen today for sinusitis.  Diagnoses and associated orders for this visit:  Acute maxillary sinusitis, recurrence not specified  Unspecified essential hypertension  Pernicious anemia  Other Orders - azithromycin (ZITHROMAX) 250 MG tablet; As directed - fluticasone (FLONASE) 50 MCG/ACT nasal spray; Place 2 sprays into both nostrils daily.   Call the office with any questions or concerns. Recheck as scheduled and as needed.

## 2014-04-19 NOTE — Progress Notes (Signed)
Pre visit review using our clinic review tool, if applicable. No additional management support is needed unless otherwise documented below in the visit note. 

## 2014-04-19 NOTE — Patient Instructions (Signed)

## 2014-04-20 ENCOUNTER — Other Ambulatory Visit: Payer: Self-pay | Admitting: Orthopedic Surgery

## 2014-04-20 ENCOUNTER — Ambulatory Visit: Payer: Medicare Other

## 2014-04-20 NOTE — Progress Notes (Signed)
Preoperative surgical orders have been place into the [facility] hospital system for Bianca [name] on 04/20/2014, 1:49 PM  by Mickel Crow for surgery on 05/24/2014.  Preop Total Hip - Anterior Approach orders including Experel Injecion, IV Tylenol, and IV Decadron as long as there are no contraindications to the above medications. Arlee Muslim, PA-C

## 2014-04-25 ENCOUNTER — Telehealth: Payer: Self-pay | Admitting: Family

## 2014-04-25 NOTE — Telephone Encounter (Signed)
Please advise 

## 2014-04-25 NOTE — Telephone Encounter (Signed)
Pt saw padonda 9/2 and still has sinus inf w/ pain in face would like another [facility]

## 2014-04-27 MED ORDER — METHYLPREDNISOLONE 4 MG PO KIT: PACK | ORAL | Status: AC

## 2014-04-27 NOTE — Telephone Encounter (Signed)
Needs to reduce sinus imflammation

## 2014-04-27 NOTE — Telephone Encounter (Signed)
Pt aware medrol dosepak sent to pharmacy

## 2014-05-02 ENCOUNTER — Telehealth: Payer: Self-pay

## 2014-05-02 NOTE — Telephone Encounter (Signed)
Spoke with pt concerning recheck of mammogram from 01/05/2014. Pt states she will call [facility] to schedule appointment. Spoke wit hDenise at [facility] to advise that pt will be calling

## 2014-05-04 ENCOUNTER — Other Ambulatory Visit: Payer: Self-pay | Admitting: Family

## 2014-05-04 ENCOUNTER — Telehealth: Payer: Self-pay | Admitting: Family

## 2014-05-04 NOTE — Telephone Encounter (Signed)
Pt called to say she is still having sinus issues and would like to know what else she can do

## 2014-05-05 ENCOUNTER — Encounter: Payer: Self-pay | Admitting: Family Medicine

## 2014-05-05 ENCOUNTER — Other Ambulatory Visit: Payer: Self-pay | Admitting: Family

## 2014-05-05 ENCOUNTER — Ambulatory Visit (INDEPENDENT_AMBULATORY_CARE_PROVIDER_SITE_OTHER): Payer: Managed Care, Other (non HMO) | Admitting: Family Medicine

## 2014-05-05 VITALS — BP 128/74 | HR 62 | Temp 97.7°F | Wt 154.0 lb

## 2014-05-05 DIAGNOSIS — J321 Chronic frontal sinusitis: Secondary | ICD-10-CM

## 2014-05-05 NOTE — Progress Notes (Signed)
Subjective:    Patient ID: Bianca [name], female    DOB: [birth year], 68 y.o.   MRN: [id number]  Sinus Problem Associated symptoms include congestion, headaches and sinus pressure. Pertinent negatives include no chills.   Patient seen with over three-week history of facial pain and headaches. Her pain has been mostly right frontal and right maxillary. She was seen initially and treated with Zithromax along with Flonase and oral prednisone. She did not see improvement. She went to a walk-in clinic and was prescribed Avelox and is finishing that. She has not seen improvement with any of these. She's also using saline irrigation with Netti pot. She has persistent yellowish nasal discharge and increased malaise. Right upper teeth pain. No fevers or chills. Occasional dry cough. No history of chronic sinusitis. Nonsmoker.  Past Medical History  Diagnosis Date  . CAD (coronary artery disease)     a. 1997 MI/PCI RCA;  b. 2007 MI/PCI of 100% RCA with Taxus DES x 3 placed, EF 55%;  c. 2010 Cath: stable anatomy;  d. 05/2012 NSTEMI/Cath/PCI: LM nl, LAD 60-22m, D1 20ost, LCX 63m, RCA 40-6m ISR, 60/95d (Treated w/ 2.75x33 Xience Xpedition DES), PDA 39m (Treated w/ PTCA), EF 60%.  . Depression   . Hyperlipidemia   . Hypertension   . Carpal tunnel syndrome on both sides   . Numbness of foot     "never recovered from last back OR, 2009" (05/17/2012)  . GERD (gastroesophageal reflux disease)   . Arthritis     "back; probably knees" (05/17/2012)  . Chronic lower back pain   . Myocardial infarction   . Anginal pain    Past Surgical History  Procedure Laterality Date  . Cesarean section  1990  . Back surgery    . Ankle surgery      left; "had to take out a floater" (05/17/2012)  . Posterior fusion lumbar spine  1962; 2009  . Coronary angioplasty with stent placement  1997; 2007    "2 + 2; total of 4"    reports that she has been smoking Cigarettes.  She has a 25 pack-year smoking history. She has  never used smokeless tobacco. She reports that she does not drink alcohol or use illicit drugs. family history includes Cancer in her paternal grandfather; Coronary artery disease in her mother; Dementia in her father; Diabetes in her mother; Hypertension in her mother; Sudden death in her brother. Allergies  Allergen Reactions  . Oxycodone Hcl Itching and Other (See Comments)    confusion  . Penicillins Rash  . Macrobid [Nitrofurantoin Monohyd Macro] Nausea And Vomiting  . Sulfa Antibiotics       Review of Systems  Constitutional: Positive for fatigue. Negative for fever and chills.  HENT: Positive for congestion and sinus pressure.   Neurological: Positive for headaches.       Objective:   Physical Exam  Constitutional: She appears well-developed and well-nourished.  HENT:  Right Ear: External ear normal.  Left Ear: External ear normal.  Mouth/Throat: Oropharynx is clear and moist.  No nasal polyps   Nasal mucosa unremarkable.  Neck: Neck supple.  Cardiovascular: Normal rate and regular rhythm.   Pulmonary/Chest: Effort normal and breath sounds normal. No respiratory distress. She has no wheezes. She has no rales.  Lymphadenopathy:    She has no cervical adenopathy.          Assessment & Plan:  Acute versus chronic sinusitis right frontal and right maxillary. She's been on 2 courses of broad-spectrum  antibiotics, nasal steroids, nasal irrigation, and oral steroids without improvement. Set up CT maxillofacial sinuses. Consider ENT referral depending on results. Patient agrees with this plan

## 2014-05-05 NOTE — Progress Notes (Signed)
Pre visit review using our clinic review tool, if applicable. No additional management support is needed unless otherwise documented below in the visit note. 

## 2014-05-05 NOTE — Patient Instructions (Signed)

## 2014-05-08 ENCOUNTER — Other Ambulatory Visit: Payer: Medicare Other

## 2014-05-10 ENCOUNTER — Ambulatory Visit (INDEPENDENT_AMBULATORY_CARE_PROVIDER_SITE_OTHER)
Admission: RE | Admit: 2014-05-10 | Discharge: 2014-05-10 | Disposition: A | Discharge status: Home / Self Care | Payer: Managed Care, Other (non HMO) | Source: Ambulatory Visit | Attending: Family Medicine | Admitting: Family Medicine

## 2014-05-10 DIAGNOSIS — J321 Chronic frontal sinusitis: Secondary | ICD-10-CM

## 2014-05-11 ENCOUNTER — Telehealth: Payer: Self-pay | Admitting: Family

## 2014-05-11 ENCOUNTER — Encounter (HOSPITAL_COMMUNITY): Payer: Self-pay | Admitting: Pharmacy Technician

## 2014-05-11 MED ORDER — LEVOFLOXACIN 500 MG PO TABS: 500.0000 mg | ORAL_TABLET | Freq: Every day | ORAL | Status: DC

## 2014-05-11 NOTE — Telephone Encounter (Signed)
Pt returning Dr Elease Hashimoto call [phone]-

## 2014-05-11 NOTE — Telephone Encounter (Signed)
Pt has been notified of CT results:  "Periapical abscess involving the roots of tooth 3. This appears to  extend into the base of the right maxillary sinus. Adjacent  opacification of right frontal sinus and right ethmoid sinus  anterior ethmoid sinuses may be secondary to the inflammation in the  right maxillary sinus. I suspect all of the findings may be  secondary to the periapical abscess"  Have recommended: #1 she will set up dental evaluation. #2 Levaquin 500 mg po qd for 10 days (already sent and let pt know) #3 consider ENT referral if not improving with the above.  Pt agrees with this plan. Please mail copy of CT report to patient.

## 2014-05-11 NOTE — Telephone Encounter (Signed)
Pt wants to come pick up results. Results are at the front for pick up.

## 2014-05-17 ENCOUNTER — Other Ambulatory Visit (HOSPITAL_COMMUNITY): Payer: Self-pay | Admitting: Orthopedic Surgery

## 2014-05-17 ENCOUNTER — Ambulatory Visit: Payer: Medicare Other

## 2014-05-17 NOTE — Patient Instructions (Addendum)
[address] RAKIYAH ESCH  05/17/2014   Your procedure is scheduled on: Wednesday October 7th, 2015  Report to [facility] Main Entrance and follow signs to  [place] at 1115 AM.  Call this number if you have problems the morning of surgery [phone]   Remember: [place]  Do not eat food:After Midnight.   clear liquids midnight until 715 am day of surgery, then nothing by mouth after 715 am   Take these medicines the morning of surgery with A SIP OF WATER: escatilopram, metoprolol                               You may not have any metal on your body including hair pins and piercings  Do not wear jewelry, make-up, lotions, powders, or deodorant.   Men may shave face and neck.  Do not bring valuables to the hospital. [place].  Contacts, dentures or bridgework may not be worn into surgery.  Leave suitcase in the car. After surgery it may be brought to your room.  For patients admitted to the hospital, checkout time is 11:00 AM the day of discharge.   ____________________________________  [facility] - Preparing for Surgery Before surgery, you can play an important role.  Because skin is not sterile, your skin needs to be as free of germs as possible.  You can reduce the number of germs on your skin by washing with CHG (chlorahexidine gluconate) soap before surgery.  CHG is an antiseptic cleaner which kills germs and bonds with the skin to continue killing germs even after washing. Please DO NOT use if you have an allergy to CHG or antibacterial soaps.  If your skin becomes reddened/irritated stop using the CHG and inform your nurse when you arrive at Short Stay. Do not shave (including legs and underarms) for at least 48 hours prior to the first CHG shower.  You may shave your face/neck. Please follow these instructions carefully:  1.  Shower with CHG Soap the night before surgery and the  morning of  Surgery.  2.  If you choose to wash your hair, wash your hair first as usual with your  normal  shampoo.  3.  After you shampoo, rinse your hair and body thoroughly to remove the  shampoo.                           4.  Use CHG as you would any other liquid soap.  You can apply chg directly  to the skin and wash                       Gently with a scrungie or clean washcloth.  5.  Apply the CHG Soap to your body ONLY FROM THE NECK DOWN.   Do not use on face/ open                           Wound or open sores. Avoid contact with eyes, ears mouth and genitals (private parts).                       Wash face,  Genitals (private parts) with your normal soap.  6.  Wash thoroughly, paying special attention to the area where your surgery  will be performed.  7.  Thoroughly rinse your body with warm water from the neck down.  8.  DO NOT shower/wash with your normal soap after using and rinsing off  the CHG Soap.                9.  Pat yourself dry with a clean towel.            10.  Wear clean pajamas.            11.  Place clean sheets on your bed the night of your first shower and do not  sleep with pets. Day of Surgery : Do not apply any lotions/deodorants the morning of surgery.  Please wear clean clothes to the hospital/surgery center.  FAILURE TO FOLLOW THESE INSTRUCTIONS MAY RESULT IN THE CANCELLATION OF YOUR SURGERY PATIENT SIGNATURE_________________________________  NURSE SIGNATURE__________________________________  ________________________________________________________________________    CLEAR LIQUID DIET   Foods Allowed                                                                     Foods Excluded  Coffee and tea, regular and decaf                             liquids that you cannot  Plain Jell-O in any flavor                                             see through such as: Fruit ices (not with fruit pulp)                                     milk, soups, orange  juice  Iced Popsicles                                    All solid food Carbonated beverages, regular and diet                                    Cranberry, grape and apple juices Sports drinks like Gatorade Lightly seasoned clear broth or consume(fat free) Sugar, honey syrup  Sample Menu Breakfast                                Lunch                                     Supper Cranberry juice                    Beef broth  Chicken broth Jell-O                                     Grape juice                           Apple juice Coffee or tea                        Jell-O                                      Popsicle                                                Coffee or tea                        Coffee or tea  _____________________________________________________________________    Incentive Spirometer  An incentive spirometer is a tool that can help keep your lungs clear and active. This tool measures how well you are filling your lungs with each breath. Taking long deep breaths may help reverse or decrease the chance of developing breathing (pulmonary) problems (especially infection) following:  A long period of time when you are unable to move or be active. BEFORE THE PROCEDURE   If the spirometer includes an indicator to show your best effort, your nurse or respiratory therapist will set it to a desired goal.  If possible, sit up straight or lean slightly forward. Try not to slouch.  Hold the incentive spirometer in an upright position. INSTRUCTIONS FOR USE  1. Sit on the edge of your bed if possible, or sit up as far as you can in bed or on a chair. 2. Hold the incentive spirometer in an upright position. 3. Breathe out normally. 4. Place the mouthpiece in your mouth and seal your lips tightly around it. 5. Breathe in slowly and as deeply as possible, raising the piston or the ball toward the top of the column. 6. Hold your breath for 3-5 seconds  or for as long as possible. Allow the piston or ball to fall to the bottom of the column. 7. Remove the mouthpiece from your mouth and breathe out normally. 8. Rest for a few seconds and repeat Steps 1 through 7 at least 10 times every 1-2 hours when you are awake. Take your time and take a few normal breaths between deep breaths. 9. The spirometer may include an indicator to show your best effort. Use the indicator as a goal to work toward during each repetition. 10. After each set of 10 deep breaths, practice coughing to be sure your lungs are clear. If you have an incision (the cut made at the time of surgery), support your incision when coughing by placing a pillow or rolled up towels firmly against it. Once you are able to get out of bed, walk around indoors and cough well. You may stop using the incentive spirometer when instructed by your caregiver.  RISKS AND COMPLICATIONS  Take your time so you do not get dizzy or light-headed.  If you are in pain, you  may need to take or ask for pain medication before doing incentive spirometry. It is harder to take a deep breath if you are having pain. AFTER USE  Rest and breathe slowly and easily.  It can be helpful to keep track of a log of your progress. Your caregiver can provide you with a simple table to help with this. If you are using the spirometer at home, follow these instructions: Grandfather IF:   You are having difficultly using the spirometer.  You have trouble using the spirometer as often as instructed.  Your pain medication is not giving enough relief while using the spirometer.  You develop fever of 100.5 F (38.1 C) or higher. SEEK IMMEDIATE MEDICAL CARE IF:   You cough up bloody sputum that had not been present before.  You develop fever of 102 F (38.9 C) or greater.  You develop worsening pain at or near the incision site. MAKE SURE YOU:   Understand these instructions.  Will watch your condition.  Will  get help right away if you are not doing well or get worse. Document Released: 12/15/2006 Document Revised: 10/27/2011 Document Reviewed: 02/15/2007 ExitCare Patient Information 2014 Exit[employer], [place].   ________________________________________________________________________  WHAT IS A BLOOD TRANSFUSION? Blood Transfusion Information  A transfusion is the replacement of blood or some of its parts. Blood is made up of multiple cells which provide different functions.  Red blood cells carry oxygen and are used for blood loss replacement.  White blood cells fight against infection.  Platelets control bleeding.  Plasma helps clot blood.  Other blood products are available for specialized needs, such as hemophilia or other clotting disorders. BEFORE THE TRANSFUSION  Who gives blood for transfusions?   Healthy volunteers who are fully evaluated to make sure their blood is safe. This is blood bank blood. Transfusion therapy is the safest it has ever been in the practice of medicine. Before blood is taken from a donor, a complete history is taken to make sure that person has no history of diseases nor engages in risky social behavior (examples are intravenous drug use or sexual activity with multiple partners). The donor's travel history is screened to minimize risk of transmitting infections, such as malaria. The donated blood is tested for signs of infectious diseases, such as HIV and hepatitis. The blood is then tested to be sure it is compatible with you in order to minimize the chance of a transfusion reaction. If you or a relative donates blood, this is often done in anticipation of surgery and is not appropriate for emergency situations. It takes many days to process the donated blood. RISKS AND COMPLICATIONS Although transfusion therapy is very safe and saves many lives, the main dangers of transfusion include:   Getting an infectious disease.  Developing a transfusion reaction. This is  an allergic reaction to something in the blood you were given. Every precaution is taken to prevent this. The decision to have a blood transfusion has been considered carefully by your caregiver before blood is given. Blood is not given unless the benefits outweigh the risks. AFTER THE TRANSFUSION  Right after receiving a blood transfusion, you will usually feel much better and more energetic. This is especially true if your red blood cells have gotten low (anemic). The transfusion raises the level of the red blood cells which carry oxygen, and this usually causes an energy increase.  The nurse administering the transfusion will monitor you carefully for complications. HOME CARE INSTRUCTIONS  No  special instructions are needed after a transfusion. You may find your energy is better. Speak with your caregiver about any limitations on activity for underlying diseases you may have. SEEK MEDICAL CARE IF:   Your condition is not improving after your transfusion.  You develop redness or irritation at the intravenous (IV) site. SEEK IMMEDIATE MEDICAL CARE IF:  Any of the following symptoms occur over the next 12 hours:  Shaking chills.  You have a temperature by mouth above 102 F (38.9 C), not controlled by medicine.  Chest, back, or muscle pain.  People around you feel you are not acting correctly or are confused.  Shortness of breath or difficulty breathing.  Dizziness and fainting.  You get a rash or develop hives.  You have a decrease in urine output.  Your urine turns a dark color or changes to pink, red, or brown. Any of the following symptoms occur over the next 10 days:  You have a temperature by mouth above 102 F (38.9 C), not controlled by medicine.  Shortness of breath.  Weakness after normal activity.  The white part of the eye turns yellow (jaundice).  You have a decrease in the amount of urine or are urinating less often.  Your urine turns a dark color or  changes to pink, red, or brown. Document Released: 08/01/2000 Document Revised: 10/27/2011 Document Reviewed: 03/20/2008 [facility] Patient Information 2014 [place], [place].  _______________________________________________________________________

## 2014-05-17 NOTE — Progress Notes (Signed)
Cardiac clearance note dr Harrington Challenger on chart Medical clearnce note dr Megan Salon on chart lov dr Harrington Challenger 07-07-13 epic  nm myocardial multi 12-19-13  Epic ekg 12-19-13 epic

## 2014-05-19 ENCOUNTER — Ambulatory Visit (HOSPITAL_COMMUNITY)
Admission: RE | Admit: 2014-05-19 | Discharge: 2014-05-19 | Disposition: A | Discharge status: Home / Self Care | Payer: Managed Care, Other (non HMO) | Source: Ambulatory Visit | Attending: Orthopedic Surgery | Admitting: Orthopedic Surgery

## 2014-05-19 ENCOUNTER — Encounter (HOSPITAL_COMMUNITY): Payer: Self-pay

## 2014-05-19 ENCOUNTER — Encounter (HOSPITAL_COMMUNITY)
Admission: RE | Admit: 2014-05-19 | Discharge: 2014-05-19 | Disposition: A | Discharge status: Home / Self Care | Payer: Managed Care, Other (non HMO) | Source: Ambulatory Visit | Attending: Orthopedic Surgery | Admitting: Orthopedic Surgery

## 2014-05-19 DIAGNOSIS — M1611 Unilateral primary osteoarthritis, right hip: Secondary | ICD-10-CM | POA: Diagnosis not present

## 2014-05-19 DIAGNOSIS — Z01818 Encounter for other preprocedural examination: Secondary | ICD-10-CM | POA: Insufficient documentation

## 2014-05-19 LAB — URINALYSIS, ROUTINE W REFLEX MICROSCOPIC
Bilirubin Urine: NEGATIVE
GLUCOSE, UA: NEGATIVE mg/dL
Ketones, ur: NEGATIVE mg/dL
Leukocytes, UA: NEGATIVE
Nitrite: NEGATIVE
PH: 5.5 (ref 5.0–8.0)
Protein, ur: NEGATIVE mg/dL
SPECIFIC GRAVITY, URINE: 1.003 — AB (ref 1.005–1.030)
Urobilinogen, UA: 0.2 mg/dL (ref 0.0–1.0)

## 2014-05-19 LAB — COMPREHENSIVE METABOLIC PANEL
ALK PHOS: 98 U/L (ref 39–117)
ALT: 12 U/L (ref 0–35)
ANION GAP: 13 (ref 5–15)
AST: 24 U/L (ref 0–37)
Albumin: 3.7 g/dL (ref 3.5–5.2)
BILIRUBIN TOTAL: 0.6 mg/dL (ref 0.3–1.2)
BUN: 7 mg/dL (ref 6–23)
CHLORIDE: 92 meq/L — AB (ref 96–112)
CO2: 26 mEq/L (ref 19–32)
Calcium: 9.8 mg/dL (ref 8.4–10.5)
Creatinine, Ser: 0.67 mg/dL (ref 0.50–1.10)
GFR, EST NON AFRICAN AMERICAN: 88 mL/min — AB (ref >90)
Glucose, Bld: 105 mg/dL — ABNORMAL HIGH (ref 70–99)
POTASSIUM: 4.1 meq/L (ref 3.7–5.3)
Sodium: 131 mEq/L — ABNORMAL LOW (ref 137–147)
Total Protein: 7.8 g/dL (ref 6.0–8.3)

## 2014-05-19 LAB — CBC
HCT: 37.5 % (ref 36.0–46.0)
Hemoglobin: 12.9 g/dL (ref 12.0–15.0)
MCH: 30.5 pg (ref 26.0–34.0)
MCHC: 34.4 g/dL (ref 30.0–36.0)
MCV: 88.7 fL (ref 78.0–100.0)
Platelets: 236 10*3/uL (ref 150–400)
RBC: 4.23 MIL/uL (ref 3.87–5.11)
RDW: 12.3 % (ref 11.5–15.5)
WBC: 6.8 10*3/uL (ref 4.0–10.5)

## 2014-05-19 LAB — URINE MICROSCOPIC-ADD ON

## 2014-05-19 LAB — PROTIME-INR
INR: 1.07 (ref 0.00–1.49)
Prothrombin Time: 14 seconds (ref 11.6–15.2)

## 2014-05-19 LAB — APTT: APTT: 41 s — AB (ref 24–37)

## 2014-05-19 LAB — SURGICAL PCR SCREEN
MRSA, PCR: NEGATIVE
STAPHYLOCOCCUS AUREUS: NEGATIVE

## 2014-05-19 LAB — ABO/RH: ABO/RH(D): O POS

## 2014-05-19 NOTE — Progress Notes (Signed)
CHEST XRAY 12-19-13 EPIC

## 2014-05-22 ENCOUNTER — Ambulatory Visit (INDEPENDENT_AMBULATORY_CARE_PROVIDER_SITE_OTHER): Payer: Managed Care, Other (non HMO)

## 2014-05-22 ENCOUNTER — Other Ambulatory Visit: Payer: Self-pay | Admitting: Orthopedic Surgery

## 2014-05-22 DIAGNOSIS — E538 Deficiency of other specified B group vitamins: Secondary | ICD-10-CM

## 2014-05-22 MED ORDER — CYANOCOBALAMIN 1000 MCG/ML IJ SOLN
1000.0000 ug | Freq: Once | INTRAMUSCULAR | Status: AC
  Administered 2014-05-22: 1000 ug via INTRAMUSCULAR

## 2014-05-22 NOTE — H&P (Signed)
Bianca [name] DOB: 24-Mar-1946 Married / Language: English / Race: White Female Date of Admission:  05/24/2014 Chief Complaint:  Right Hip Pain History of Present Illness  The patient is a 68 year old female who comes in for a preoperative History and Physical. The patient is scheduled for a right total hip arthroplasty (anterior approach) to be performed by Dr. Dione Plover. Aluisio, MD at [facility] on 05/24/2014. The patient is a 68 year old female who presents for follow up of their hip. The patient is being followed for their right osteoarthritis. They are year(s) out from when symptoms began. Symptoms reported today include: pain (groin radiating to the level of the knee ) and pain with weightbearing, while the patient does not report symptoms of: catching or grinding. The patient feels that they are doing poorly. The following medication has been used for pain control: Ultram (per PCP ). The patient presented following last office visit with [facility] on [phone]. She has been seen by Dr. Rushie Nyhan about two years ago and was told then that she had bone on bone arthritis in that hip at that time. She was infromed that she would eventually need a hip replacement but had to put it off due to her cardiac issues and heart stents. She was told that she had to wait at least a full year. She was seen by her cardiologist Dr. Harrington Challenger earlier and was told that she was now cleared for orthopedic surgery.  The pain in her hip has gotten worse over the past two years since she was last seen. It has become more difficult to walk any distance and painful with weight bearing. There is not significant pain at rest or at night. She has found it more difficult to get in and out of the car, go up and down stairs, and she even stopped wearing socks because its so difficult to get them on and off. She has put off the hip surgery for a good while now and she is finally at a point medicaly that she can undergo the  procedure and she is at a point where she would like to consider having the right hip replaced. She aslo has carpal tunnel syndrome in the wrists and the left is a little worse than the right. She believes that the wrist may require surgery also but she wants to get the hip done at this time. Unfortunately her hip has gotten progressively worse over time. She has pain at all times now. It is also keeping her up at night. It is limiting what she can and cannot do. She is at a stage now where she wants to get her hip fixed and her family is encouraging her to do so. She is ready to proceed. They have been treated conservatively in the past for the above stated problem and despite conservative measures, they continue to have progressive pain and severe functional limitations and dysfunction. They have failed non-operative management including home exercise, medications. It is felt that they would benefit from undergoing total joint replacement. Risks and benefits of the procedure have been discussed with the patient and they elect to proceed with surgery. There are no active contraindications to surgery such as ongoing infection or rapidly progressive neurological disease.  Allergies Penicillins Rash  Problem List/Past Medical Bilateral carpal tunnel syndrome (354.0  G56.01, G56.02) Primary osteoarthritis of right hip (715.15  M16.11) Osteoarthritis, hip (715.35) Left forearm pain (729.5  E45.409) Depression Coronary artery disease Chronic Pain  Hypercholesterolemia Gastroesophageal Reflux Disease Myocardial infarction Date: 2013  Family History Diabetes Mellitus Mother. mother Osteoarthritis mother Cancer sister and grandfather mothers side Heart Disease mother Congestive Heart Failure mother and grandmother mothers side  Social History Illicit drug use no Drug/Alcohol Rehab (Currently) no Marital status married Living situation live with spouse Children 2 Alcohol  use never consumed alcohol Pain Contract no Most recent primary occupation housewife Tobacco use Former smoker. former smoker Previously in rehab no Post-Surgical Plans Home  Medication History Plavix 75mg  Active. Pantoprazole Sod DR 40mg  Active. Escitalopram 20mg  Active. Aspirin 81mg  Active. Metoprolol Tartrate (Oral) Specific dose unknown - Active. (50mg ) Simvastatin (Oral) Specific dose unknown - Active. (80mg ) Nitroglycerin (Sublingual) Specific dose unknown - Active. TraMADol HCl (50MG  Tablet, Oral) Active. Zolpidem Tartrate (10MG  Tablet, Oral) Active.  Past Surgical History Spinal Fusion lower back Heart Stents 5 Foot Surgery left Cesarean Delivery 1 time Cardiac Catherterization 3 times  Review of Systems General Not Present- Chills, Fatigue, Fever, Memory Loss, Night Sweats, Weight Gain and Weight Loss. Skin Not Present- Eczema, Hives, Itching, Lesions and Rash. HEENT Present- Sinusitis (recent sinus infection - September 2015). Not Present- Dentures, Double Vision, Headache, Hearing Loss, Tinnitus and Visual Loss. Respiratory Not Present- Allergies, Chronic Cough, Coughing up blood, Shortness of breath at rest and Shortness of breath with exertion. Cardiovascular Not Present- Chest Pain, Difficulty Breathing Lying Down, Murmur, Palpitations, Racing/skipping heartbeats and Swelling. Gastrointestinal Not Present- Abdominal Pain, Bloody Stool, Constipation, Diarrhea, Difficulty Swallowing, Heartburn, Jaundice, Loss of appetitie, Nausea and Vomiting. Female Genitourinary Not Present- Blood in Urine, Discharge, Flank Pain, Incontinence, Painful Urination, Urgency, Urinary frequency, Urinary Retention, Urinating at Night and Weak urinary stream. Musculoskeletal Present- Joint Pain and Morning Stiffness. Not Present- Back Pain, Joint Swelling, Muscle Pain, Muscle Weakness and Spasms. Neurological Not Present- Blackout spells, Difficulty with balance, Dizziness,  Paralysis, Tremor and Weakness. Psychiatric Not Present- Insomnia.  Vitals Pulse: 56 (Regular)  Resp.: 12 (Unlabored)  BP: 128/62 (Sitting, Right Arm, Standard)  Physical Exam General Mental Status -Alert, cooperative and good historian. General Appearance-pleasant, Not in acute distress. Orientation-Oriented X3. Build & Nutrition-Well nourished and Well developed.  Head and Neck Head-normocephalic, atraumatic . Neck Global Assessment - supple, no bruit auscultated on the right, no bruit auscultated on the left.  Eye Vision-Wears corrective lenses. Pupil - Bilateral-Regular and Round. Motion - Bilateral-EOMI.  Chest and Lung Exam Auscultation Breath sounds - clear at anterior chest wall and clear at posterior chest wall. Adventitious sounds - No Adventitious sounds.  Cardiovascular Auscultation Rhythm - Regular rate and rhythm. Heart Sounds - S1 WNL and S2 WNL. Murmurs & Other Heart Sounds - Auscultation of the heart reveals - No Murmurs.  Abdomen Palpation/Percussion Tenderness - Abdomen is non-tender to palpation. Rigidity (guarding) - Abdomen is soft. Auscultation Auscultation of the abdomen reveals - Bowel sounds normal.  Female Genitourinary Note: Not done, not pertinent to present illness  Musculoskeletal Note: Very pleasant, well developed female, alert and oriented in no apparent distress. Left hip has normal range of motion. No discomfort. Right hip flexion is 90. No internal rotation, about 20 degrees external rotation, 20 degrees abduction. The knee exam is normal. Pulses, sensation and motor intact distally. She has a significantly antalgic gait pattern on the right.  RADIOGRAPHS: AP pelvis, AP and lateral of the right hip show that she has severe endstage arthritis of the right hip, bone on bone throughout with subchondral cystic formation and some erosion on the superolateral aspect of the femoral head. The left hip  looks  normal.  Assessment & Plan Primary osteoarthritis of right hip (M16.11) Note:Plan is for a Right Total Hip Replacement - Anterior Approach by Dr. Wynelle Link.  Plan is to go home.  PCP - Dr. Megan Salon - Patient has been seen preoperatively and felt to be stable for surgery.  Cards - Dr. Harrington Challenger - Patient has been seen preoperatively and felt to be stable for surgery.  The patient will topical receive TXA (tranexamic acid) due to: MI, CAD  Signed electronically by Joelene Millin, III PA-C

## 2014-05-23 ENCOUNTER — Encounter: Payer: Self-pay | Admitting: Internal Medicine

## 2014-05-23 NOTE — Anesthesia Preprocedure Evaluation (Addendum)
Anesthesia Evaluation  Patient identified by MRN, date of birth, ID band Patient awake    Reviewed: Allergy & Precautions, H&P , NPO status , Patient's Chart, lab work & pertinent test results, reviewed documented beta blocker date and time   History of Anesthesia Complications Negative for: history of anesthetic complications  Airway Mallampati: III TM Distance: >3 FB Neck ROM: Full    Dental  (+) Partial Upper, Poor Dentition, Dental Advisory Given, Missing   Pulmonary Current Smoker,  breath sounds clear to auscultation  Pulmonary exam normal       Cardiovascular Exercise Tolerance: Good hypertension, Pt. on medications and Pt. on home beta blockers + CAD, + Past MI and + Cardiac Stents Rhythm:Regular Rate:Normal  Off ASA and Plavix for 5 days per cardiology, denies any symptoms or problems since last stent in 2013, reports METS >4   Neuro/Psych PSYCHIATRIC DISORDERS Anxiety Depression negative neurological ROS     GI/Hepatic Neg liver ROS, GERD-  Medicated and Controlled,  Endo/Other  negative endocrine ROS  Renal/GU negative Renal ROS  negative genitourinary   Musculoskeletal  (+) Arthritis -, Osteoarthritis,    Abdominal   Peds negative pediatric ROS (+)  Hematology negative hematology ROS (+)   Anesthesia Other Findings   Reproductive/Obstetrics negative OB ROS                       Anesthesia Physical Anesthesia Plan  ASA: III  Anesthesia Plan: General   Post-op Pain Management:    Induction: Intravenous  Airway Management Planned: Oral ETT  Additional Equipment:   Intra-op Plan:   Post-operative Plan: Extubation in OR  Informed Consent: I have reviewed the patients History and Physical, chart, labs and discussed the procedure including the risks, benefits and alternatives for the proposed anesthesia with the patient or authorized representative who has indicated his/her  understanding and acceptance.   Dental advisory given  Plan Discussed with: CRNA  Anesthesia Plan Comments:        Anesthesia Quick Evaluation

## 2014-05-24 ENCOUNTER — Encounter (HOSPITAL_COMMUNITY): Payer: Self-pay | Admitting: *Deleted

## 2014-05-24 ENCOUNTER — Inpatient Hospital Stay (HOSPITAL_COMMUNITY): Payer: Managed Care, Other (non HMO)

## 2014-05-24 ENCOUNTER — Inpatient Hospital Stay (HOSPITAL_COMMUNITY): Payer: Managed Care, Other (non HMO) | Admitting: Anesthesiology

## 2014-05-24 ENCOUNTER — Inpatient Hospital Stay (HOSPITAL_COMMUNITY)
Admission: RE | Admit: 2014-05-24 | Discharge: 2014-05-26 | DRG: 470 | Disposition: A | Discharge status: Home Health | Payer: Managed Care, Other (non HMO) | Source: Ambulatory Visit | Attending: Orthopedic Surgery | Admitting: Orthopedic Surgery

## 2014-05-24 ENCOUNTER — Encounter (HOSPITAL_COMMUNITY)
Admission: RE | Disposition: A | Discharge status: Home Health | Payer: Self-pay | Source: Ambulatory Visit | Attending: Orthopedic Surgery

## 2014-05-24 ENCOUNTER — Encounter (HOSPITAL_COMMUNITY): Payer: Managed Care, Other (non HMO) | Admitting: Anesthesiology

## 2014-05-24 DIAGNOSIS — Z79891 Long term (current) use of opiate analgesic: Secondary | ICD-10-CM

## 2014-05-24 DIAGNOSIS — Z87891 Personal history of nicotine dependence: Secondary | ICD-10-CM

## 2014-05-24 DIAGNOSIS — Z8249 Family history of ischemic heart disease and other diseases of the circulatory system: Secondary | ICD-10-CM | POA: Diagnosis not present

## 2014-05-24 DIAGNOSIS — Z981 Arthrodesis status: Secondary | ICD-10-CM | POA: Diagnosis not present

## 2014-05-24 DIAGNOSIS — B379 Candidiasis, unspecified: Secondary | ICD-10-CM | POA: Diagnosis present

## 2014-05-24 DIAGNOSIS — Z7982 Long term (current) use of aspirin: Secondary | ICD-10-CM

## 2014-05-24 DIAGNOSIS — F329 Major depressive disorder, single episode, unspecified: Secondary | ICD-10-CM | POA: Diagnosis present

## 2014-05-24 DIAGNOSIS — E780 Pure hypercholesterolemia: Secondary | ICD-10-CM | POA: Diagnosis present

## 2014-05-24 DIAGNOSIS — Z809 Family history of malignant neoplasm, unspecified: Secondary | ICD-10-CM

## 2014-05-24 DIAGNOSIS — Z833 Family history of diabetes mellitus: Secondary | ICD-10-CM

## 2014-05-24 DIAGNOSIS — Z79899 Other long term (current) drug therapy: Secondary | ICD-10-CM | POA: Diagnosis not present

## 2014-05-24 DIAGNOSIS — E785 Hyperlipidemia, unspecified: Secondary | ICD-10-CM | POA: Diagnosis present

## 2014-05-24 DIAGNOSIS — I2510 Atherosclerotic heart disease of native coronary artery without angina pectoris: Secondary | ICD-10-CM | POA: Diagnosis present

## 2014-05-24 DIAGNOSIS — M1611 Unilateral primary osteoarthritis, right hip: Secondary | ICD-10-CM | POA: Diagnosis present

## 2014-05-24 DIAGNOSIS — Z7902 Long term (current) use of antithrombotics/antiplatelets: Secondary | ICD-10-CM

## 2014-05-24 DIAGNOSIS — M25551 Pain in right hip: Secondary | ICD-10-CM | POA: Diagnosis present

## 2014-05-24 DIAGNOSIS — I252 Old myocardial infarction: Secondary | ICD-10-CM

## 2014-05-24 DIAGNOSIS — G8929 Other chronic pain: Secondary | ICD-10-CM | POA: Diagnosis present

## 2014-05-24 DIAGNOSIS — Z880 Allergy status to penicillin: Secondary | ICD-10-CM

## 2014-05-24 DIAGNOSIS — Z955 Presence of coronary angioplasty implant and graft: Secondary | ICD-10-CM | POA: Diagnosis not present

## 2014-05-24 DIAGNOSIS — M545 Low back pain: Secondary | ICD-10-CM | POA: Diagnosis present

## 2014-05-24 DIAGNOSIS — G5600 Carpal tunnel syndrome, unspecified upper limb: Secondary | ICD-10-CM | POA: Diagnosis present

## 2014-05-24 DIAGNOSIS — M169 Osteoarthritis of hip, unspecified: Secondary | ICD-10-CM | POA: Diagnosis present

## 2014-05-24 DIAGNOSIS — K219 Gastro-esophageal reflux disease without esophagitis: Secondary | ICD-10-CM | POA: Diagnosis present

## 2014-05-24 HISTORY — PX: TOTAL HIP ARTHROPLASTY: SHX124

## 2014-05-24 SURGERY — ARTHROPLASTY, HIP, TOTAL, ANTERIOR APPROACH
Anesthesia: General | Site: Hip | Laterality: Right

## 2014-05-24 MED ORDER — SODIUM CHLORIDE 0.9 % IV SOLN
2000.0000 mg | Freq: Once | INTRAVENOUS | Status: DC
  Filled 2014-05-24: qty 20

## 2014-05-24 MED ORDER — FLEET ENEMA 7-19 GM/118ML RE ENEM: 1.0000 | ENEMA | Freq: Once | RECTAL | Status: AC | PRN

## 2014-05-24 MED ORDER — ACETAMINOPHEN 500 MG PO TABS
1000.0000 mg | ORAL_TABLET | Freq: Four times a day (QID) | ORAL | Status: AC
  Administered 2014-05-24 – 2014-05-25 (×3): 1000 mg via ORAL
  Filled 2014-05-24 (×4): qty 2

## 2014-05-24 MED ORDER — ONDANSETRON HCL 4 MG/2ML IJ SOLN
INTRAMUSCULAR | Status: AC
  Filled 2014-05-24: qty 2

## 2014-05-24 MED ORDER — VANCOMYCIN HCL IN DEXTROSE 1-5 GM/200ML-% IV SOLN
1000.0000 mg | INTRAVENOUS | Status: AC
  Administered 2014-05-24: 1000 mg via INTRAVENOUS

## 2014-05-24 MED ORDER — METOPROLOL TARTRATE 25 MG PO TABS
25.0000 mg | ORAL_TABLET | Freq: Two times a day (BID) | ORAL | Status: DC
  Administered 2014-05-24 – 2014-05-26 (×2): 25 mg via ORAL
  Filled 2014-05-24 (×5): qty 1

## 2014-05-24 MED ORDER — SUCCINYLCHOLINE CHLORIDE 20 MG/ML IJ SOLN
INTRAMUSCULAR | Status: DC | PRN
  Administered 2014-05-24: 100 mg via INTRAVENOUS

## 2014-05-24 MED ORDER — FENTANYL CITRATE 0.05 MG/ML IJ SOLN
INTRAMUSCULAR | Status: DC | PRN
  Administered 2014-05-24 (×2): 50 ug via INTRAVENOUS
  Administered 2014-05-24: 100 ug via INTRAVENOUS

## 2014-05-24 MED ORDER — ZOLPIDEM TARTRATE 5 MG PO TABS
5.0000 mg | ORAL_TABLET | Freq: Every evening | ORAL | Status: DC | PRN
Start: 2014-05-24 — End: 2014-05-26
  Administered 2014-05-25 (×2): 5 mg via ORAL
  Filled 2014-05-24 (×2): qty 1

## 2014-05-24 MED ORDER — ONDANSETRON HCL 4 MG/2ML IJ SOLN: 4.0000 mg | Freq: Four times a day (QID) | INTRAMUSCULAR | Status: DC | PRN

## 2014-05-24 MED ORDER — HYDROMORPHONE HCL 1 MG/ML IJ SOLN
0.2500 mg | INTRAMUSCULAR | Status: DC | PRN
  Administered 2014-05-24 (×2): 0.5 mg via INTRAVENOUS

## 2014-05-24 MED ORDER — HYDROMORPHONE HCL 1 MG/ML IJ SOLN
INTRAMUSCULAR | Status: DC | PRN
  Administered 2014-05-24 (×4): 0.5 mg via INTRAVENOUS

## 2014-05-24 MED ORDER — DIPHENHYDRAMINE HCL 12.5 MG/5ML PO ELIX: 12.5000 mg | ORAL_SOLUTION | ORAL | Status: DC | PRN

## 2014-05-24 MED ORDER — ONDANSETRON HCL 4 MG PO TABS: 4.0000 mg | ORAL_TABLET | Freq: Four times a day (QID) | ORAL | Status: DC | PRN

## 2014-05-24 MED ORDER — FENTANYL CITRATE 0.05 MG/ML IJ SOLN
INTRAMUSCULAR | Status: AC
  Filled 2014-05-24: qty 2

## 2014-05-24 MED ORDER — NITROGLYCERIN 0.4 MG SL SUBL: 0.4000 mg | SUBLINGUAL_TABLET | SUBLINGUAL | Status: DC | PRN

## 2014-05-24 MED ORDER — HYDROMORPHONE HCL 2 MG/ML IJ SOLN
INTRAMUSCULAR | Status: AC
  Filled 2014-05-24: qty 1

## 2014-05-24 MED ORDER — SODIUM CHLORIDE 0.9 % IJ SOLN
INTRAMUSCULAR | Status: AC
  Filled 2014-05-24: qty 50

## 2014-05-24 MED ORDER — METHOCARBAMOL 1000 MG/10ML IJ SOLN
500.0000 mg | Freq: Four times a day (QID) | INTRAVENOUS | Status: DC | PRN
  Administered 2014-05-24: 500 mg via INTRAVENOUS
  Filled 2014-05-24: qty 5

## 2014-05-24 MED ORDER — KETOROLAC TROMETHAMINE 15 MG/ML IJ SOLN
7.5000 mg | Freq: Four times a day (QID) | INTRAMUSCULAR | Status: AC | PRN
  Administered 2014-05-24: 7.5 mg via INTRAVENOUS
  Filled 2014-05-24: qty 1

## 2014-05-24 MED ORDER — POLYETHYLENE GLYCOL 3350 17 G PO PACK: 17.0000 g | PACK | Freq: Every day | ORAL | Status: DC | PRN

## 2014-05-24 MED ORDER — ESCITALOPRAM OXALATE 20 MG PO TABS
20.0000 mg | ORAL_TABLET | Freq: Every morning | ORAL | Status: DC
  Administered 2014-05-25 – 2014-05-26 (×2): 20 mg via ORAL
  Filled 2014-05-24 (×2): qty 1

## 2014-05-24 MED ORDER — FLUTICASONE PROPIONATE 50 MCG/ACT NA SUSP
2.0000 | Freq: Every day | NASAL | Status: DC | PRN
Start: 2014-05-24 — End: 2014-05-26

## 2014-05-24 MED ORDER — ROCURONIUM BROMIDE 100 MG/10ML IV SOLN
INTRAVENOUS | Status: DC | PRN
  Administered 2014-05-24: 10 mg via INTRAVENOUS
  Administered 2014-05-24: 30 mg via INTRAVENOUS

## 2014-05-24 MED ORDER — PROMETHAZINE HCL 25 MG/ML IJ SOLN: 6.2500 mg | INTRAMUSCULAR | Status: DC | PRN

## 2014-05-24 MED ORDER — SODIUM CHLORIDE 0.9 % IV SOLN
INTRAVENOUS | Status: DC
  Administered 2014-05-24: 17:00:00 via INTRAVENOUS

## 2014-05-24 MED ORDER — DEXAMETHASONE SODIUM PHOSPHATE 10 MG/ML IJ SOLN: 10.0000 mg | Freq: Once | INTRAMUSCULAR | Status: DC

## 2014-05-24 MED ORDER — ROCURONIUM BROMIDE 100 MG/10ML IV SOLN
INTRAVENOUS | Status: AC
  Filled 2014-05-24: qty 1

## 2014-05-24 MED ORDER — SODIUM CHLORIDE 0.9 % IV SOLN: INTRAVENOUS | Status: DC

## 2014-05-24 MED ORDER — BUPIVACAINE HCL (PF) 0.25 % IJ SOLN
INTRAMUSCULAR | Status: DC | PRN
  Administered 2014-05-24: 20 mL

## 2014-05-24 MED ORDER — HYDROMORPHONE HCL 2 MG PO TABS
2.0000 mg | ORAL_TABLET | ORAL | Status: DC | PRN
  Administered 2014-05-24: 4 mg via ORAL
  Administered 2014-05-24: 2 mg via ORAL
  Administered 2014-05-25 (×2): 4 mg via ORAL
  Administered 2014-05-25: 2 mg via ORAL
  Administered 2014-05-25 – 2014-05-26 (×4): 4 mg via ORAL
  Filled 2014-05-24 (×2): qty 2
  Filled 2014-05-24: qty 1
  Filled 2014-05-24 (×6): qty 2

## 2014-05-24 MED ORDER — FENTANYL CITRATE 0.05 MG/ML IJ SOLN
25.0000 ug | INTRAMUSCULAR | Status: DC | PRN
  Administered 2014-05-24 (×3): 50 ug via INTRAVENOUS

## 2014-05-24 MED ORDER — ONDANSETRON HCL 4 MG/2ML IJ SOLN
INTRAMUSCULAR | Status: DC | PRN
  Administered 2014-05-24: 4 mg via INTRAVENOUS

## 2014-05-24 MED ORDER — HYDROMORPHONE HCL 1 MG/ML IJ SOLN
INTRAMUSCULAR | Status: AC
  Filled 2014-05-24: qty 1

## 2014-05-24 MED ORDER — HYDROMORPHONE HCL 1 MG/ML IJ SOLN
0.5000 mg | INTRAMUSCULAR | Status: DC | PRN
  Administered 2014-05-24: 1 mg via INTRAVENOUS

## 2014-05-24 MED ORDER — GLYCOPYRROLATE 0.2 MG/ML IJ SOLN
INTRAMUSCULAR | Status: DC | PRN
  Administered 2014-05-24: .5 mg via INTRAVENOUS

## 2014-05-24 MED ORDER — MORPHINE SULFATE 2 MG/ML IJ SOLN
INTRAMUSCULAR | Status: AC
  Filled 2014-05-24: qty 1

## 2014-05-24 MED ORDER — LIDOCAINE HCL (CARDIAC) 20 MG/ML IV SOLN
INTRAVENOUS | Status: DC | PRN
  Administered 2014-05-24: 80 mg via INTRAVENOUS

## 2014-05-24 MED ORDER — CHLORHEXIDINE GLUCONATE 4 % EX LIQD: 60.0000 mL | Freq: Once | CUTANEOUS | Status: DC

## 2014-05-24 MED ORDER — BUPIVACAINE LIPOSOME 1.3 % IJ SUSP
20.0000 mL | Freq: Once | INTRAMUSCULAR | Status: DC
  Filled 2014-05-24: qty 20

## 2014-05-24 MED ORDER — PROPOFOL 10 MG/ML IV BOLUS
INTRAVENOUS | Status: AC
  Filled 2014-05-24: qty 20

## 2014-05-24 MED ORDER — VANCOMYCIN HCL IN DEXTROSE 1-5 GM/200ML-% IV SOLN
1000.0000 mg | Freq: Two times a day (BID) | INTRAVENOUS | Status: AC
  Administered 2014-05-25: 1000 mg via INTRAVENOUS
  Filled 2014-05-24: qty 200

## 2014-05-24 MED ORDER — EPHEDRINE SULFATE 50 MG/ML IJ SOLN
INTRAMUSCULAR | Status: DC | PRN
  Administered 2014-05-24 (×2): 5 mg via INTRAVENOUS

## 2014-05-24 MED ORDER — ACETAMINOPHEN 10 MG/ML IV SOLN
1000.0000 mg | Freq: Once | INTRAVENOUS | Status: AC
  Administered 2014-05-24: 1000 mg via INTRAVENOUS
  Filled 2014-05-24: qty 100

## 2014-05-24 MED ORDER — TRAMADOL HCL 50 MG PO TABS
50.0000 mg | ORAL_TABLET | Freq: Four times a day (QID) | ORAL | Status: DC | PRN
  Administered 2014-05-25: 100 mg via ORAL
  Filled 2014-05-24: qty 2

## 2014-05-24 MED ORDER — PROPOFOL 10 MG/ML IV BOLUS
INTRAVENOUS | Status: DC | PRN
  Administered 2014-05-24: 130 mg via INTRAVENOUS

## 2014-05-24 MED ORDER — PANTOPRAZOLE SODIUM 40 MG PO TBEC
40.0000 mg | DELAYED_RELEASE_TABLET | Freq: Every evening | ORAL | Status: DC
  Administered 2014-05-24 – 2014-05-25 (×2): 40 mg via ORAL
  Filled 2014-05-24 (×3): qty 1

## 2014-05-24 MED ORDER — LIDOCAINE HCL (CARDIAC) 20 MG/ML IV SOLN
INTRAVENOUS | Status: AC
  Filled 2014-05-24: qty 5

## 2014-05-24 MED ORDER — 0.9 % SODIUM CHLORIDE (POUR BTL) OPTIME
TOPICAL | Status: DC | PRN
  Administered 2014-05-24: 1000 mL

## 2014-05-24 MED ORDER — DOCUSATE SODIUM 100 MG PO CAPS
100.0000 mg | ORAL_CAPSULE | Freq: Two times a day (BID) | ORAL | Status: DC
  Administered 2014-05-24 – 2014-05-26 (×4): 100 mg via ORAL

## 2014-05-24 MED ORDER — MIDAZOLAM HCL 5 MG/5ML IJ SOLN
INTRAMUSCULAR | Status: DC | PRN
  Administered 2014-05-24 (×2): 1 mg via INTRAVENOUS

## 2014-05-24 MED ORDER — METHOCARBAMOL 500 MG PO TABS
500.0000 mg | ORAL_TABLET | Freq: Four times a day (QID) | ORAL | Status: DC | PRN
  Administered 2014-05-24 – 2014-05-26 (×3): 500 mg via ORAL
  Filled 2014-05-24 (×4): qty 1

## 2014-05-24 MED ORDER — PHENOL 1.4 % MT LIQD: 1.0000 | OROMUCOSAL | Status: DC | PRN

## 2014-05-24 MED ORDER — TRANEXAMIC ACID 100 MG/ML IV SOLN
2000.0000 mg | INTRAVENOUS | Status: DC | PRN
  Administered 2014-05-24: 2000 mg via TOPICAL

## 2014-05-24 MED ORDER — BUPIVACAINE HCL (PF) 0.25 % IJ SOLN
INTRAMUSCULAR | Status: AC
  Filled 2014-05-24: qty 30

## 2014-05-24 MED ORDER — SODIUM CHLORIDE 0.9 % IJ SOLN
INTRAMUSCULAR | Status: DC | PRN
  Administered 2014-05-24: 30 mL

## 2014-05-24 MED ORDER — MIDAZOLAM HCL 2 MG/2ML IJ SOLN
INTRAMUSCULAR | Status: AC
  Filled 2014-05-24: qty 2

## 2014-05-24 MED ORDER — BUPIVACAINE LIPOSOME 1.3 % IJ SUSP
INTRAMUSCULAR | Status: DC | PRN
  Administered 2014-05-24: 20 mL

## 2014-05-24 MED ORDER — DEXAMETHASONE SODIUM PHOSPHATE 10 MG/ML IJ SOLN
INTRAMUSCULAR | Status: DC | PRN
  Administered 2014-05-24: 10 mg via INTRAVENOUS

## 2014-05-24 MED ORDER — RIVAROXABAN 10 MG PO TABS
10.0000 mg | ORAL_TABLET | Freq: Every day | ORAL | Status: DC
Start: 2014-05-25 — End: 2014-05-26
  Administered 2014-05-25 – 2014-05-26 (×2): 10 mg via ORAL
  Filled 2014-05-24 (×3): qty 1

## 2014-05-24 MED ORDER — NEOSTIGMINE METHYLSULFATE 10 MG/10ML IV SOLN
INTRAVENOUS | Status: DC | PRN
  Administered 2014-05-24: 3.5 mg via INTRAVENOUS

## 2014-05-24 MED ORDER — DEXAMETHASONE SODIUM PHOSPHATE 10 MG/ML IJ SOLN
10.0000 mg | Freq: Once | INTRAMUSCULAR | Status: AC
  Administered 2014-05-25: 10 mg via INTRAVENOUS
  Filled 2014-05-24: qty 1

## 2014-05-24 MED ORDER — PROMETHAZINE HCL 25 MG/ML IJ SOLN
INTRAMUSCULAR | Status: AC
  Filled 2014-05-24: qty 1

## 2014-05-24 MED ORDER — METOCLOPRAMIDE HCL 5 MG/ML IJ SOLN: 5.0000 mg | Freq: Three times a day (TID) | INTRAMUSCULAR | Status: DC | PRN

## 2014-05-24 MED ORDER — VANCOMYCIN HCL IN DEXTROSE 1-5 GM/200ML-% IV SOLN
INTRAVENOUS | Status: AC
  Filled 2014-05-24: qty 200

## 2014-05-24 MED ORDER — METOCLOPRAMIDE HCL 10 MG PO TABS
5.0000 mg | ORAL_TABLET | Freq: Three times a day (TID) | ORAL | Status: DC | PRN
Start: 2014-05-24 — End: 2014-05-26

## 2014-05-24 MED ORDER — ACETAMINOPHEN 650 MG RE SUPP: 650.0000 mg | Freq: Four times a day (QID) | RECTAL | Status: DC | PRN

## 2014-05-24 MED ORDER — BISACODYL 10 MG RE SUPP
10.0000 mg | Freq: Every day | RECTAL | Status: DC | PRN
Start: 2014-05-24 — End: 2014-05-26

## 2014-05-24 MED ORDER — MENTHOL 3 MG MT LOZG: 1.0000 | LOZENGE | OROMUCOSAL | Status: DC | PRN

## 2014-05-24 MED ORDER — METOPROLOL TARTRATE 25 MG PO TABS
25.0000 mg | ORAL_TABLET | Freq: Once | ORAL | Status: AC
  Administered 2014-05-24: 25 mg via ORAL
  Filled 2014-05-24: qty 1

## 2014-05-24 MED ORDER — LACTATED RINGERS IV SOLN
INTRAVENOUS | Status: DC
  Administered 2014-05-24: 1000 mL via INTRAVENOUS

## 2014-05-24 MED ORDER — ACETAMINOPHEN 325 MG PO TABS: 650.0000 mg | ORAL_TABLET | Freq: Four times a day (QID) | ORAL | Status: DC | PRN

## 2014-05-24 SURGICAL SUPPLY — 45 items
BAG ZIPLOCK 12X15 (MISCELLANEOUS) IMPLANT
BLADE EXTENDED COATED 6.5IN (ELECTRODE) ×3 IMPLANT
BLADE SAG 18X100X1.27 (BLADE) ×3 IMPLANT
CAPT HIP PF COP ×3 IMPLANT
CLOSURE WOUND 1/2 X4 (GAUZE/BANDAGES/DRESSINGS) ×1
COVER PERINEAL POST (MISCELLANEOUS) ×3 IMPLANT
DECANTER SPIKE VIAL GLASS SM (MISCELLANEOUS) ×3 IMPLANT
DRAPE C-ARM 42X120 X-RAY (DRAPES) ×3 IMPLANT
DRAPE STERI IOBAN 125X83 (DRAPES) ×3 IMPLANT
DRAPE U-SHAPE 47X51 STRL (DRAPES) ×9 IMPLANT
DRSG ADAPTIC 3X8 NADH LF (GAUZE/BANDAGES/DRESSINGS) ×3 IMPLANT
DRSG MEPILEX BORDER 4X4 (GAUZE/BANDAGES/DRESSINGS) ×3 IMPLANT
DRSG MEPILEX BORDER 4X8 (GAUZE/BANDAGES/DRESSINGS) ×3 IMPLANT
DURAPREP 26ML APPLICATOR (WOUND CARE) ×3 IMPLANT
ELECT REM PT RETURN 9FT ADLT (ELECTROSURGICAL) ×3
ELECTRODE REM PT RTRN 9FT ADLT (ELECTROSURGICAL) ×1 IMPLANT
EVACUATOR 1/8 PVC DRAIN (DRAIN) ×3 IMPLANT
FACESHIELD WRAPAROUND (MASK) ×12 IMPLANT
GAUZE SPONGE 4X4 12PLY STRL (GAUZE/BANDAGES/DRESSINGS) IMPLANT
GLOVE BIO SURGEON STRL SZ7 (GLOVE) ×3 IMPLANT
GLOVE BIO SURGEON STRL SZ7.5 (GLOVE) ×3 IMPLANT
GLOVE BIO SURGEON STRL SZ8 (GLOVE) ×6 IMPLANT
GLOVE BIOGEL PI IND STRL 6.5 (GLOVE) ×1 IMPLANT
GLOVE BIOGEL PI IND STRL 7.0 (GLOVE) ×3 IMPLANT
GLOVE BIOGEL PI IND STRL 8 (GLOVE) ×2 IMPLANT
GLOVE BIOGEL PI INDICATOR 6.5 (GLOVE) ×2
GLOVE BIOGEL PI INDICATOR 7.0 (GLOVE) ×6
GLOVE BIOGEL PI INDICATOR 8 (GLOVE) ×4
GOWN STRL REUS W/TWL LRG LVL3 (GOWN DISPOSABLE) ×3 IMPLANT
GOWN STRL REUS W/TWL XL LVL3 (GOWN DISPOSABLE) ×9 IMPLANT
KIT BASIN OR (CUSTOM PROCEDURE TRAY) ×3 IMPLANT
NDL SAFETY ECLIPSE 18X1.5 (NEEDLE) ×2 IMPLANT
NEEDLE HYPO 18GX1.5 SHARP (NEEDLE) ×4
PACK TOTAL JOINT (CUSTOM PROCEDURE TRAY) ×3 IMPLANT
STRIP CLOSURE SKIN 1/2X4 (GAUZE/BANDAGES/DRESSINGS) ×2 IMPLANT
SUT ETHIBOND NAB CT1 #1 30IN (SUTURE) ×3 IMPLANT
SUT MNCRL AB 4-0 PS2 18 (SUTURE) ×3 IMPLANT
SUT VIC AB 2-0 CT1 27 (SUTURE) ×4
SUT VIC AB 2-0 CT1 TAPERPNT 27 (SUTURE) ×2 IMPLANT
SUT VLOC 180 0 24IN GS25 (SUTURE) ×3 IMPLANT
SYR 20CC LL (SYRINGE) ×3 IMPLANT
SYR 50ML LL SCALE MARK (SYRINGE) ×3 IMPLANT
TOWEL OR 17X26 10 PK STRL BLUE (TOWEL DISPOSABLE) ×3 IMPLANT
TOWEL OR NON WOVEN STRL DISP B (DISPOSABLE) ×3 IMPLANT
TRAY FOLEY CATH 14FRSI W/METER (CATHETERS) ×3 IMPLANT

## 2014-05-24 NOTE — Interval H&P Note (Signed)
History and Physical Interval Note:  05/24/2014 12:08 PM  Bianca [name]  has presented today for surgery, with the diagnosis of OA OF RIGHT HIP  The various methods of treatment have been discussed with the patient and family. After consideration of risks, benefits and other options for treatment, the patient has consented to  Procedure(s): RIGHT TOTAL HIP ARTHROPLASTY ANTERIOR APPROACH (Right) as a surgical intervention .  The patient's history has been reviewed, patient examined, no change in status, stable for surgery.  I have reviewed the patient's chart and labs.  Questions were answered to the patient's satisfaction.     Gearlean Alf

## 2014-05-24 NOTE — Progress Notes (Signed)
Utilization review completed.  

## 2014-05-24 NOTE — Transfer of Care (Signed)
Immediate Anesthesia Transfer of Care Note  Patient: Bianca [name]  Procedure(s) Performed: Procedure(s): RIGHT TOTAL HIP ARTHROPLASTY ANTERIOR APPROACH (Right)  Patient Location: PACU  Anesthesia Type:General  Level of Consciousness: sedated  Airway & Oxygen Therapy: Patient Spontanous Breathing and Patient connected to face mask oxygen  Post-op Assessment: Report given to PACU RN and Post -op Vital signs reviewed and stable  Post vital signs: Reviewed and stable  Complications: No apparent anesthesia complications

## 2014-05-24 NOTE — Progress Notes (Signed)
PACU note-----pt still c/o pain level 9; Dr. Jillyn Hidden, anesthesiologist, called, order rec'd and med given

## 2014-05-24 NOTE — H&P (View-Only) (Signed)
Bianca [name] DOB: 1946-08-06 Married / Language: English / Race: White Female Date of Admission:  05/24/2014 Chief Complaint:  Right Hip Pain History of Present Illness  The patient is a 68 year old female who comes in for a preoperative History and Physical. The patient is scheduled for a right total hip arthroplasty (anterior approach) to be performed by Dr. Dione Plover. Aluisio, MD at [facility] on 05/24/2014. The patient is a 68 year old female who presents for follow up of their hip. The patient is being followed for their right osteoarthritis. They are year(s) out from when symptoms began. Symptoms reported today include: pain (groin radiating to the level of the knee ) and pain with weightbearing, while the patient does not report symptoms of: catching or grinding. The patient feels that they are doing poorly. The following medication has been used for pain control: Ultram (per PCP ). The patient presented following last office visit with [facility] on [phone]. She has been seen by Dr. Rushie Nyhan about two years ago and was told then that she had bone on bone arthritis in that hip at that time. She was infromed that she would eventually need a hip replacement but had to put it off due to her cardiac issues and heart stents. She was told that she had to wait at least a full year. She was seen by her cardiologist Dr. Harrington Challenger earlier and was told that she was now cleared for orthopedic surgery.  The pain in her hip has gotten worse over the past two years since she was last seen. It has become more difficult to walk any distance and painful with weight bearing. There is not significant pain at rest or at night. She has found it more difficult to get in and out of the car, go up and down stairs, and she even stopped wearing socks because its so difficult to get them on and off. She has put off the hip surgery for a good while now and she is finally at a point medicaly that she can undergo the  procedure and she is at a point where she would like to consider having the right hip replaced. She aslo has carpal tunnel syndrome in the wrists and the left is a little worse than the right. She believes that the wrist may require surgery also but she wants to get the hip done at this time. Unfortunately her hip has gotten progressively worse over time. She has pain at all times now. It is also keeping her up at night. It is limiting what she can and cannot do. She is at a stage now where she wants to get her hip fixed and her family is encouraging her to do so. She is ready to proceed. They have been treated conservatively in the past for the above stated problem and despite conservative measures, they continue to have progressive pain and severe functional limitations and dysfunction. They have failed non-operative management including home exercise, medications. It is felt that they would benefit from undergoing total joint replacement. Risks and benefits of the procedure have been discussed with the patient and they elect to proceed with surgery. There are no active contraindications to surgery such as ongoing infection or rapidly progressive neurological disease.  Allergies Penicillins Rash  Problem List/Past Medical Bilateral carpal tunnel syndrome (354.0  G56.01, G56.02) Primary osteoarthritis of right hip (715.15  M16.11) Osteoarthritis, hip (715.35) Left forearm pain (729.5  R15.400) Depression Coronary artery disease Chronic Pain  Hypercholesterolemia Gastroesophageal Reflux Disease Myocardial infarction Date: 2013  Family History Diabetes Mellitus Mother. mother Osteoarthritis mother Cancer sister and grandfather mothers side Heart Disease mother Congestive Heart Failure mother and grandmother mothers side  Social History Illicit drug use no Drug/Alcohol Rehab (Currently) no Marital status married Living situation live with spouse Children 2 Alcohol  use never consumed alcohol Pain Contract no Most recent primary occupation housewife Tobacco use Former smoker. former smoker Previously in rehab no Post-Surgical Plans Home  Medication History Plavix 75mg  Active. Pantoprazole Sod DR 40mg  Active. Escitalopram 20mg  Active. Aspirin 81mg  Active. Metoprolol Tartrate (Oral) Specific dose unknown - Active. (50mg ) Simvastatin (Oral) Specific dose unknown - Active. (80mg ) Nitroglycerin (Sublingual) Specific dose unknown - Active. TraMADol HCl (50MG  Tablet, Oral) Active. Zolpidem Tartrate (10MG  Tablet, Oral) Active.  Past Surgical History Spinal Fusion lower back Heart Stents 5 Foot Surgery left Cesarean Delivery 1 time Cardiac Catherterization 3 times  Review of Systems General Not Present- Chills, Fatigue, Fever, Memory Loss, Night Sweats, Weight Gain and Weight Loss. Skin Not Present- Eczema, Hives, Itching, Lesions and Rash. HEENT Present- Sinusitis (recent sinus infection - September 2015). Not Present- Dentures, Double Vision, Headache, Hearing Loss, Tinnitus and Visual Loss. Respiratory Not Present- Allergies, Chronic Cough, Coughing up blood, Shortness of breath at rest and Shortness of breath with exertion. Cardiovascular Not Present- Chest Pain, Difficulty Breathing Lying Down, Murmur, Palpitations, Racing/skipping heartbeats and Swelling. Gastrointestinal Not Present- Abdominal Pain, Bloody Stool, Constipation, Diarrhea, Difficulty Swallowing, Heartburn, Jaundice, Loss of appetitie, Nausea and Vomiting. Female Genitourinary Not Present- Blood in Urine, Discharge, Flank Pain, Incontinence, Painful Urination, Urgency, Urinary frequency, Urinary Retention, Urinating at Night and Weak urinary stream. Musculoskeletal Present- Joint Pain and Morning Stiffness. Not Present- Back Pain, Joint Swelling, Muscle Pain, Muscle Weakness and Spasms. Neurological Not Present- Blackout spells, Difficulty with balance, Dizziness,  Paralysis, Tremor and Weakness. Psychiatric Not Present- Insomnia.  Vitals Pulse: 56 (Regular)  Resp.: 12 (Unlabored)  BP: 128/62 (Sitting, Right Arm, Standard)  Physical Exam General Mental Status -Alert, cooperative and good historian. General Appearance-pleasant, Not in acute distress. Orientation-Oriented X3. Build & Nutrition-Well nourished and Well developed.  Head and Neck Head-normocephalic, atraumatic . Neck Global Assessment - supple, no bruit auscultated on the right, no bruit auscultated on the left.  Eye Vision-Wears corrective lenses. Pupil - Bilateral-Regular and Round. Motion - Bilateral-EOMI.  Chest and Lung Exam Auscultation Breath sounds - clear at anterior chest wall and clear at posterior chest wall. Adventitious sounds - No Adventitious sounds.  Cardiovascular Auscultation Rhythm - Regular rate and rhythm. Heart Sounds - S1 WNL and S2 WNL. Murmurs & Other Heart Sounds - Auscultation of the heart reveals - No Murmurs.  Abdomen Palpation/Percussion Tenderness - Abdomen is non-tender to palpation. Rigidity (guarding) - Abdomen is soft. Auscultation Auscultation of the abdomen reveals - Bowel sounds normal.  Female Genitourinary Note: Not done, not pertinent to present illness  Musculoskeletal Note: Very pleasant, well developed female, alert and oriented in no apparent distress. Left hip has normal range of motion. No discomfort. Right hip flexion is 90. No internal rotation, about 20 degrees external rotation, 20 degrees abduction. The knee exam is normal. Pulses, sensation and motor intact distally. She has a significantly antalgic gait pattern on the right.  RADIOGRAPHS: AP pelvis, AP and lateral of the right hip show that she has severe endstage arthritis of the right hip, bone on bone throughout with subchondral cystic formation and some erosion on the superolateral aspect of the femoral head. The left hip  looks  normal.  Assessment & Plan Primary osteoarthritis of right hip (M16.11) Note:Plan is for a Right Total Hip Replacement - Anterior Approach by Dr. Wynelle Link.  Plan is to go home.  PCP - Dr. Megan Salon - Patient has been seen preoperatively and felt to be stable for surgery.  Cards - Dr. Harrington Challenger - Patient has been seen preoperatively and felt to be stable for surgery.  The patient will topical receive TXA (tranexamic acid) due to: MI, CAD  Signed electronically by Joelene Millin, III PA-C

## 2014-05-24 NOTE — Op Note (Signed)
OPERATIVE REPORT  PREOPERATIVE DIAGNOSIS: Osteoarthritis of the Right hip.   POSTOPERATIVE DIAGNOSIS: Osteoarthritis of the Right  hip.   PROCEDURE: Right total hip arthroplasty, anterior approach.   SURGEON: Gaynelle Arabian, MD   ASSISTANT: Arlee Muslim, PA-C  ANESTHESIA:  General  ESTIMATED BLOOD LOSS:-1100 ml  DRAINS: Hemovac x1.   COMPLICATIONS: None   CONDITION: PACU - hemodynamically stable.   BRIEF CLINICAL NOTE: Bianca [name] is a 68 y.o. female who has advanced end-  stage arthritis of her Right  hip with progressively worsening pain and  dysfunction.The patient has failed nonoperative management and presents for  total hip arthroplasty.   PROCEDURE IN DETAIL: After successful administration of spinal  anesthetic, the traction boots for the [facility] bed were placed on both  feet and the patient was placed onto the [facility] bed, boots placed into the leg  holders. The Right hip was then isolated from the perineum with plastic  drapes and prepped and draped in the usual sterile fashion. ASIS and  greater trochanter were marked and a oblique incision was made, starting  at about 1 cm lateral and 2 cm distal to the ASIS and coursing towards  the anterior cortex of the femur. The skin was cut with a 10 blade  through subcutaneous tissue to the level of the fascia overlying the  tensor fascia lata muscle. The fascia was then incised in line with the  incision at the junction of the anterior third and posterior 2/3rd. The  muscle was teased off the fascia and then the interval between the TFL  and the rectus was developed. The Hohmann retractor was then placed at  the top of the femoral neck over the capsule. The vessels overlying the  capsule were cauterized and the fat on top of the capsule was removed.  A Hohmann retractor was then placed anterior underneath the rectus  femoris to give exposure to the entire anterior capsule. A T-shaped  capsulotomy was performed.  The edges were tagged and the femoral head  was identified.       Osteophytes are removed off the superior acetabulum.  The femoral neck was then cut in situ with an oscillating saw. Traction  was then applied to the left lower extremity utilizing the [facility]  traction. The femoral head was then removed. Retractors were placed  around the acetabulum and then circumferential removal of the labrum was  performed. Osteophytes were also removed. Reaming starts at 45 mm to  medialize and  Increased in 2 mm increments to 47 mm. We reamed in  approximately 40 degrees of abduction, 20 degrees anteversion. A 48 mm  pinnacle acetabular shell was then impacted in anatomic position under  fluoroscopic guidance with excellent purchase. We did not need to place  any additional dome screws. A 28 mm neutral + 4 marathon polyethylene liner was then  placed into the acetabular shell.       The femoral lift was then placed along the lateral aspect of the femur  just distal to the vastus ridge. The leg was  externally rotated and capsule  was stripped off the inferior aspect of the femoral neck down to the  level of the lesser trochanter, this was done with electrocautery. The femur was lifted after this was performed. The  leg was then placed and extended in adducted position to essentially delivering the femur. We also removed the capsule superiorly and the  piriformis from the piriformis  fossa to gain excellent exposure of the  proximal femur. Rongeur was used to remove some cancellous bone to get  into the lateral portion of the proximal femur for placement of the  initial starter reamer. The starter broaches was placed  the starter broach  and was shown to go down the center of the canal. Broaching  with the  Corail system was then performed starting at size 8, coursing  Up to size 11. A size 11 had excellent torsional and rotational  and axial stability. The trial standard offset neck was then placed  with  a 28 + 1.5 trial head. The hip was then reduced. We confirmed that  the stem was in the canal both on AP and lateral x-rays. It also has excellent sizing. The hip was reduced with outstanding stability through full extension, full external rotation,  and then flexion in adduction internal rotation. AP pelvis was taken  and the leg lengths were measured and found to be exactly equal. Hip  was then dislocated again and the femoral head and neck removed. The  femoral broach was removed. Size 11 Corail stem with a standard offset  neck was then impacted into the femur following native anteversion. Has  excellent purchase in the canal. Excellent torsional and rotational and  axial stability. It is confirmed to be in the canal on AP and lateral  fluoroscopic views. The 28 + 1.5 ceramic head was placed and the hip  reduced with outstanding stability. Again AP pelvis was taken and it  confirmed that the leg lengths were equal. The wound was then copiously  irrigated with saline solution and the capsule reattached and repaired  with Ethibond suture.  20 mL of Exparel mixed with 50 mL of saline then additional 20 ml of .25% Bupivicaine injected into the capsule and into the edge of the tensor fascia lata as well as subcutaneous tissue. The fascia overlying the tensor fascia lata was  then closed with a running #1 V-Loc. Subcu was closed with interrupted  2-0 Vicryl and subcuticular running 4-0 Monocryl. Incision was cleaned  and dried. Steri-Strips and a bulky sterile dressing applied. Hemovac  drain was hooked to suction and then he was awakened and transported to  recovery in stable condition.        Please note that a surgical assistant was a medical necessity for this procedure to perform it in a safe and expeditious manner. Assistant was necessary to provide appropriate retraction of vital neurovascular structures and to prevent femoral fracture and allow for anatomic placement of the  prosthesis.  Gaynelle Arabian, M.D.

## 2014-05-25 ENCOUNTER — Encounter (HOSPITAL_COMMUNITY): Payer: Self-pay | Admitting: Orthopedic Surgery

## 2014-05-25 LAB — BASIC METABOLIC PANEL
Anion gap: 8 (ref 5–15)
BUN: 4 mg/dL — ABNORMAL LOW (ref 6–23)
CHLORIDE: 94 meq/L — AB (ref 96–112)
CO2: 25 meq/L (ref 19–32)
CREATININE: 0.57 mg/dL (ref 0.50–1.10)
Calcium: 8.6 mg/dL (ref 8.4–10.5)
GFR calc non Af Amer: >90 mL/min (ref >90)
Glucose, Bld: 130 mg/dL — ABNORMAL HIGH (ref 70–99)
POTASSIUM: 4.5 meq/L (ref 3.7–5.3)
Sodium: 127 mEq/L — ABNORMAL LOW (ref 137–147)

## 2014-05-25 LAB — CBC
HEMATOCRIT: 24.5 % — AB (ref 36.0–46.0)
Hemoglobin: 8.6 g/dL — ABNORMAL LOW (ref 12.0–15.0)
MCH: 30.5 pg (ref 26.0–34.0)
MCHC: 35.1 g/dL (ref 30.0–36.0)
MCV: 86.9 fL (ref 78.0–100.0)
Platelets: 217 10*3/uL (ref 150–400)
RBC: 2.82 MIL/uL — AB (ref 3.87–5.11)
RDW: 12 % (ref 11.5–15.5)
WBC: 9.5 10*3/uL (ref 4.0–10.5)

## 2014-05-25 MED ORDER — FLUCONAZOLE 100 MG PO TABS
100.0000 mg | ORAL_TABLET | Freq: Every day | ORAL | Status: DC
  Administered 2014-05-25: 100 mg via ORAL
  Filled 2014-05-25 (×2): qty 1

## 2014-05-25 MED ORDER — SODIUM CHLORIDE 0.9 % IV BOLUS (SEPSIS)
250.0000 mL | Freq: Once | INTRAVENOUS | Status: AC
  Administered 2014-05-25: 250 mL via INTRAVENOUS

## 2014-05-25 MED ORDER — SODIUM CHLORIDE 0.9 % IV BOLUS (SEPSIS)
500.0000 mL | Freq: Once | INTRAVENOUS | Status: AC
  Administered 2014-05-25: 500 mL via INTRAVENOUS

## 2014-05-25 MED ORDER — SODIUM CHLORIDE 0.9 % IV SOLN: INTRAVENOUS | Status: DC

## 2014-05-25 NOTE — Progress Notes (Signed)
Physical Therapy Treatment Patient Details Name: Bianca [name] MRN: [id number] DOB: [birth year] Today's Date: 2014-06-11    History of Present Illness R THR    PT Comments    Marked improvement in activity tolerance with no c/o dizziness this pm  Follow Up Recommendations  Home health PT     Equipment Recommendations  None recommended by PT    Recommendations for Other Services OT consult     Precautions / Restrictions Precautions Precautions: Fall Restrictions Weight Bearing Restrictions: No Other Position/Activity Restrictions: WBAT    Mobility  Bed Mobility Overal bed mobility: Needs Assistance Bed Mobility: Supine to Sit;Sit to Supine     Supine to sit: Min assist Sit to supine: Min assist   General bed mobility comments: cues for sequence and use of L LE to self assist  Transfers Overall transfer level: Needs assistance Equipment used: Rolling walker (2 wheeled) Transfers: Sit to/from Stand Sit to Stand: Min assist         General transfer comment: cues for LE management and use of UES to self assist  Ambulation/Gait Ambulation/Gait assistance: Min assist Ambulation Distance (Feet): 200 Feet Assistive device: Rolling walker (2 wheeled) Gait Pattern/deviations: Step-to pattern;Step-through pattern;Decreased step length - right;Decreased step length - left;Shuffle;Trunk flexed     General Gait Details: cues for posture, position from RW and initial sequence   Stairs Stairs: Yes Stairs assistance: Min assist Stair Management: Two rails;Step to pattern;Forwards Number of Stairs: 2 General stair comments: cues for sequence and foot placement  Wheelchair Mobility    Modified Rankin (Stroke Patients Only)       Balance                                    Cognition Arousal/Alertness: Awake/alert Behavior During Therapy: WFL for tasks assessed/performed Overall Cognitive Status: Within Functional Limits for tasks assessed                       Exercises      General Comments        Pertinent Vitals/Pain Pain Assessment: 0-10 Pain Score: 4  Pain Descriptors / Indicators: Aching Pain Intervention(s): Limited activity within patient's tolerance;Monitored during session;Premedicated before session;Ice applied    Home Living                      Prior Function            PT Goals (current goals can now be found in the care plan section) Acute Rehab PT Goals Patient Stated Goal: Resume previous lifestyle with decreased pain PT Goal Formulation: With patient Time For Goal Achievement: 06/08/14 Potential to Achieve Goals: Good Progress towards PT goals: Progressing toward goals    Frequency  7X/week    PT Plan Current plan remains appropriate    Co-evaluation             End of Session Equipment Utilized During Treatment: Gait belt Activity Tolerance: Patient tolerated treatment well Patient left: in bed;with call bell/phone within reach     Time: 1447-1505 PT Time Calculation (min): 18 min  Charges:  $Gait Training: 8-22 mins                    G Codes:      Shawon Denzer 2014-06-11, 4:44 PM

## 2014-05-25 NOTE — Anesthesia Postprocedure Evaluation (Signed)
  Anesthesia Post-op Note  Patient: Bianca [name]  Procedure(s) Performed: Procedure(s) (LRB): RIGHT TOTAL HIP ARTHROPLASTY ANTERIOR APPROACH (Right)  Patient Location: PACU  Anesthesia Type: General  Level of Consciousness: awake and alert   Airway and Oxygen Therapy: Patient Spontanous Breathing  Post-op Pain: mild  Post-op Assessment: Post-op Vital signs reviewed, Patient's Cardiovascular Status Stable, Respiratory Function Stable, Patent Airway and No signs of Nausea or vomiting  Last Vitals:  Filed Vitals:   05/25/14 1543  BP: 119/57  Pulse: 70  Temp: 36.7 C  Resp: 20    Post-op Vital Signs: stable   Complications: No apparent anesthesia complications

## 2014-05-25 NOTE — Evaluation (Signed)
Physical Therapy Evaluation Patient Details Name: Bianca [name] MRN: [id number] DOB: [birth year] Today's Date: 05/25/2014   History of Present Illness  R THR  Clinical Impression  Pt s/p R THR presents with decreased R LE strength/ROM and post op pain limiting functional mobility.  Pt should progress well to d.c home with family assist and HHPT follow up    Follow Up Recommendations Home health PT    Equipment Recommendations  None recommended by PT    Recommendations for Other Services OT consult     Precautions / Restrictions Precautions Precautions: Fall Restrictions Weight Bearing Restrictions: No Other Position/Activity Restrictions: WBAT      Mobility  Bed Mobility Overal bed mobility: Needs Assistance Bed Mobility: Supine to Sit     Supine to sit: Min assist;Mod assist     General bed mobility comments: cues for sequence and use of L LE to self assist  Transfers Overall transfer level: Needs assistance Equipment used: Rolling walker (2 wheeled) Transfers: Sit to/from Stand Sit to Stand: Min assist;Mod assist         General transfer comment: cues for LE management and use of UES to self assist  Ambulation/Gait Ambulation/Gait assistance: Min assist;Mod assist Ambulation Distance (Feet): 26 Feet Assistive device: Rolling walker (2 wheeled) Gait Pattern/deviations: Step-to pattern;Step-through pattern;Decreased step length - right;Decreased step length - left;Shuffle;Trunk flexed     General Gait Details: cues for posture, position from RW and initial sequence  Stairs            Wheelchair Mobility    Modified Rankin (Stroke Patients Only)       Balance                                             Pertinent Vitals/Pain Pain Assessment: 0-10 Pain Score: 3  Pain Descriptors / Indicators: Aching Pain Intervention(s): Limited activity within patient's tolerance;Monitored during session;Premedicated before session;Ice  applied    Home Living Family/patient expects to be discharged to:: Private residence Living Arrangements: Spouse/significant other Available Help at Discharge: Family Type of Home: House Home Access: Stairs to enter Entrance Stairs-Rails: Right;Left;Can reach both Entrance Stairs-Number of Steps: 2+1 Home Layout: Two level Home Equipment: Environmental consultant - 2 wheels;Cane - single point      Prior Function Level of Independence: Independent               Hand Dominance   Dominant Hand: Right    Extremity/Trunk Assessment   Upper Extremity Assessment: Overall WFL for tasks assessed           Lower Extremity Assessment: RLE deficits/detail RLE Deficits / Details: 2+/5 hip strength with AAROM at hip to 85 flex and 15 abd    Cervical / Trunk Assessment: Normal  Communication   Communication: No difficulties  Cognition Arousal/Alertness: Awake/alert Behavior During Therapy: WFL for tasks assessed/performed Overall Cognitive Status: Within Functional Limits for tasks assessed                      General Comments      Exercises Total Joint Exercises Ankle Circles/Pumps: AROM;Both;15 reps;Supine Quad Sets: AROM;Both;10 reps;Supine Heel Slides: AAROM;Right;15 reps;Supine Hip ABduction/ADduction: AAROM;Right;15 reps;Supine      Assessment/Plan    PT Assessment Patient needs continued PT services  PT Diagnosis Difficulty walking   PT Problem List Decreased strength;Decreased activity tolerance;Decreased range of motion;Decreased mobility;Decreased  knowledge of use of DME;Pain  PT Treatment Interventions DME instruction;Gait training;Stair training;Functional mobility training;Therapeutic activities;Therapeutic exercise;Patient/family education   PT Goals (Current goals can be found in the Care Plan section) Acute Rehab PT Goals Patient Stated Goal: Resume previous lifestyle with decreased pain PT Goal Formulation: With patient Time For Goal Achievement:  06/08/14 Potential to Achieve Goals: Good    Frequency 7X/week   Barriers to discharge        Co-evaluation               End of Session Equipment Utilized During Treatment: Gait belt Activity Tolerance: Patient tolerated treatment well;Other (comment) (pt dizzy - BP 104/49) Patient left: in chair;with call bell/phone within reach Nurse Communication: Mobility status         Time: 2725-3664 PT Time Calculation (min): 33 min   Charges:   PT Evaluation $Initial PT Evaluation Tier I: 1 Procedure PT Treatments $Gait Training: 8-22 mins $Therapeutic Exercise: 8-22 mins   PT G Codes:          [name] 05/25/2014, 12:34 PM

## 2014-05-25 NOTE — Progress Notes (Signed)
OT Cancellation Note  Patient Details Name: Bianca [name] MRN: [id number] DOB: 1946/07/20   Cancelled Treatment:    Reason Eval/Treat Not Completed: Other (comment).  Pt was lightheaded earlier; decreased BP after first bolus.  Will check back later today or tomorrow.  Justeen Hehr 05/25/2014, 1:31 PM Lesle Chris, OTR/L [phone] 05/25/2014

## 2014-05-25 NOTE — Progress Notes (Signed)
Subjective: 1 Day Post-Op Procedure(s) (LRB): RIGHT TOTAL HIP ARTHROPLASTY ANTERIOR APPROACH (Right) Patient reports pain as mild.   Patient seen in rounds by Dr. Wynelle Link. Patient is well, but has had some minor complaints of pain in the hip, requiring pain medications We will start therapy today.  Plan is to go Home after hospital stay.  Objective: Vital signs in last 24 hours: Temp:  [97.6 F (36.4 C)-98.4 F (36.9 C)] 98 F (36.7 C) (10/08 0546) Pulse Rate:  [55-65] 55 (10/08 0546) Resp:  [9-20] 20 (10/08 0546) BP: (102-142)/(53-71) 103/53 mmHg (10/08 0546) SpO2:  [97 %-100 %] 98 % (10/08 0546) Weight:  [71.668 kg (158 lb)] 71.668 kg (158 lb) (10/07 1038)  Intake/Output from previous day:  Intake/Output Summary (Last 24 hours) at 05/25/14 0717 Last data filed at 05/25/14 0657  Gross per 24 hour  Intake 4180.14 ml  Output   4535 ml  Net -354.86 ml    Intake/Output this shift: UOP over 2000 since MN Negative on fluids as per the chart. Monitor pressures.  Labs:  Recent Labs  05/25/14 0500  HGB 8.6*    Recent Labs  05/25/14 0500  WBC 9.5  RBC 2.82*  HCT 24.5*  PLT 217    Recent Labs  05/25/14 0500  NA 127*  K 4.5  CL 94*  CO2 25  BUN 4*  CREATININE 0.57  GLUCOSE 130*  CALCIUM 8.6   No results found for this basename: LABPT, INR,  in the last 72 hours  EXAM General - Patient is Alert and Appropriate Extremity - Neurovascular intact Sensation intact distally Dorsiflexion/Plantar flexion intact Dressing - dressing C/D/I Motor Function - intact, moving foot and toes well on exam.  Hemovac pulled without difficulty.  Past Medical History  Diagnosis Date  . CAD (coronary artery disease)     a. 1997 MI/PCI RCA;  b. 2007 MI/PCI of 100% RCA with Taxus DES x 3 placed, EF 55%;  c. 2010 Cath: stable anatomy;  d. 05/2012 NSTEMI/Cath/PCI: LM nl, LAD 60-66m, D1 20ost, LCX 28m, RCA 40-70m ISR, 60/95d (Treated w/ 2.75x33 Xience Xpedition DES), PDA 80m  (Treated w/ PTCA), EF 60%.  . Depression   . Hyperlipidemia   . Carpal tunnel syndrome on both sides   . Numbness of foot left foot    "never recovered from last back OR, 2009" (05/17/2012)  . GERD (gastroesophageal reflux disease)   . Arthritis     "back; probably knees" (05/17/2012)  . Chronic lower back pain   . Anginal pain   . Myocardial infarction  1997, 2007 , last 2013    x 3    Assessment/Plan: 1 Day Post-Op Procedure(s) (LRB): RIGHT TOTAL HIP ARTHROPLASTY ANTERIOR APPROACH (Right) Principal Problem:   OA (osteoarthritis) of hip  Estimated body mass index is 28.42 kg/(m^2) as calculated from the following:   Height as of this encounter: 5' 2.5" (1.588 m).   Weight as of this encounter: 71.668 kg (158 lb). Advance diet Up with therapy Plan for discharge tomorrow Discharge home with home health  DVT Prophylaxis - Xarelto for a total of 21 days following surgery and then she will resume her Plavix and her baby 81 mg Aspirin at the completion of the Xarelto. Weight Bearing As Tolerated right Leg Hemovac Pulled Begin Therapy Additional fluids this morning.  Pressures soft last night with large UOP since MN  For SCIP protocol, the patient was placed on a one time dose of Diflucan for a developing yeast infection.  Arlee Muslim, PA-C Orthopaedic Surgery 05/25/2014, 7:17 AM

## 2014-05-25 NOTE — Progress Notes (Signed)
Pt blood pressure 104/47 and heart rate 62 at 0927 after fluid bolus completed. Rechecked at 1025 blood pressure was 100/54 and heart rate 64. Pt alert and oriented.  Physician office called, currently not available to answer.

## 2014-05-25 NOTE — Care Management Note (Signed)
    Page 1 of 2   05/25/2014     11:21:25 AM CARE MANAGEMENT NOTE 05/25/2014  Patient:  NASHEA, CHUMNEY   Account Number:  [id number]  Date Initiated:  05/25/2014  Documentation initiated by:  [facility]  Subjective/Objective Assessment:   adm: RIGHT TOTAL HIP ARTHROPLASTY ANTERIOR APPROACH (Right)     Action/Plan:   discharge planning   Anticipated DC Date:  05/25/2014   Anticipated DC Plan:  [place]  CM consult      [facility] Choice  HOME HEALTH   Choice offered to / List presented to:  C-1 Patient   DME arranged  NA      DME agency  NA     [facility] arranged  HH-2 PT      [place]   Status of service:  Completed, signed off Medicare Important Message given?   (If response is "NO", the following Medicare IM given date fields will be blank) Date Medicare IM given:   Medicare IM given by:   Date Additional Medicare IM given:   Additional Medicare IM given by:    Discharge Disposition:  [place]  Per UR Regulation:    If discussed at Long Length of Stay Meetings, dates discussed:    Comments:  Dellie Catholic, RN Registered Nurse Signed CASE MANAGEMENT Progress Notes Service date: 05/25/2014 8:58 AM 05/25/14 07:30 CM met with pt in room to offer choice of home health agency.  Pt chooses [facility] to render HHPT.  Pt states she has both a rolling walker and 3n1 at home. Address and contact information verified with pt.  Referral called to [facility] Lee[facility].  No other CM needs were communicated.  Mariane Masters, BSN, CM [phone].

## 2014-05-25 NOTE — Discharge Instructions (Addendum)
Dr. Gaynelle Arabian Total Joint Specialist [facility] [address] [place], [place] [address] [phone]    ANTERIOR APPROACH TOTAL HIP REPLACEMENT POSTOPERATIVE DIRECTIONS   Hip Rehabilitation, Guidelines Following Surgery  The results of a hip operation are greatly improved after range of motion and muscle strengthening exercises. Follow all safety measures which are given to protect your hip. If any of these exercises cause increased pain or swelling in your joint, decrease the amount until you are comfortable again. Then slowly increase the exercises. Call your caregiver if you have problems or questions.  HOME CARE INSTRUCTIONS  Most of the following instructions are designed to prevent the dislocation of your new hip.  Remove items at home which could result in a fall. This includes throw rugs or furniture in walking pathways.  Continue medications as instructed at time of discharge.  You may have some home medications which will be placed on hold until you complete the course of blood thinner medication.  You may start showering once you are discharged home but do not submerge the incision under water. Just pat the incision dry and apply a dry gauze dressing on daily. Do not put on socks or shoes without following the instructions of your caregivers.  Sit on high chairs which makes it easier to stand.  Sit on chairs with arms. Use the chair arms to help push yourself up when arising.  Keep your leg on the side of the operation out in front of you when standing up.  Arrange for the use of a toilet seat elevator so you are not sitting low.    Walk with walker as instructed.  You may resume a sexual relationship in one month or when given the OK by your caregiver.  Use walker as long as suggested by your caregivers.  You may put full weight on your legs and walk as much as is comfortable. Avoid periods of inactivity such as sitting longer than an hour  when not asleep. This helps prevent blood clots.  You may return to work once you are cleared by Engineer, production.  Do not drive a car for 6 weeks or until released by your surgeon.  Do not drive while taking narcotics.  Wear elastic stockings for three weeks following surgery during the day but you may remove then at night.  Make sure you keep all of your appointments after your operation with all of your doctors and caregivers. You should call the office at the above phone number and make an appointment for approximately two weeks after the date of your surgery. Change the dressing daily and reapply a dry dressing each time. Please pick up a stool softener and laxative for home use as long as you are requiring pain medications.  Continue to use ice on the hip for pain and swelling from surgery. You may notice swelling that will progress down to the foot and ankle.  This is normal after surgery.  Elevate the leg when you are not up walking on it.   It is important for you to complete the blood thinner medication as prescribed by your doctor.  Continue to use the breathing machine which will help keep your temperature down.  It is common for your temperature to cycle up and down following surgery, especially at night when you are not up moving around and exerting yourself.  The breathing machine keeps your lungs expanded and your temperature down.  RANGE OF MOTION AND STRENGTHENING EXERCISES  These exercises are designed to help you keep full movement of your hip joint. Follow your caregiver's or physical therapist's instructions. Perform all exercises about fifteen times, three times per day or as directed. Exercise both hips, even if you have had only one joint replacement. These exercises can be done on a training (exercise) mat, on the floor, on a table or on a bed. Use whatever works the best and is most comfortable for you. Use music or television while you are exercising so that the exercises are a  pleasant break in your day. This will make your life better with the exercises acting as a break in routine you can look forward to.  Lying on your back, slowly slide your foot toward your buttocks, raising your knee up off the floor. Then slowly slide your foot back down until your leg is straight again.  Lying on your back spread your legs as far apart as you can without causing discomfort.  Lying on your side, raise your upper leg and foot straight up from the floor as far as is comfortable. Slowly lower the leg and repeat.  Lying on your back, tighten up the muscle in the front of your thigh (quadriceps muscles). You can do this by keeping your leg straight and trying to raise your heel off the floor. This helps strengthen the largest muscle supporting your knee.  Lying on your back, tighten up the muscles of your buttocks both with the legs straight and with the knee bent at a comfortable angle while keeping your heel on the floor.   SKILLED REHAB INSTRUCTIONS: If the patient is transferred to a skilled rehab facility following release from the hospital, a list of the current medications will be sent to the facility for the patient to continue.  When discharged from the skilled rehab facility, please have the facility set up the patient's [place] prior to being released. Also, the skilled facility will be responsible for providing the patient with their medications at time of release from the facility to include their pain medication, the muscle relaxants, and their blood thinner medication. If the patient is still at the rehab facility at time of the two week follow up appointment, the skilled rehab facility will also need to assist the patient in arranging follow up appointment in our office and any transportation needs.  MAKE SURE YOU:  Understand these instructions.  Will watch your condition.  Will get help right away if you are not doing well or get worse.  Pick up  stool softner and laxative for home. Do not submerge incision under water. May shower. Continue to use ice for pain and swelling from surgery. Total Hip Protocol.  Take Xarelto for two and a half more weeks, then discontinue Xarelto. Once the patient has completed the Xarelto, they may resume the 81 mg Aspirin and the 75 mg Plavix. Start an over the counter Iron Supplement (Ferrous Sulfate 325 mg) twice a day for three weeks.  Information on my medicine - XARELTO (Rivaroxaban)  This medication education was reviewed with me or my healthcare representative as part of my discharge preparation.  The pharmacist that spoke with me during my hospital stay was:  Julio Sicks, [facility]  Why was Xarelto prescribed for you? Xarelto was prescribed for you to reduce the risk of blood clots forming after orthopedic surgery. The medical term for these abnormal blood clots is venous thromboembolism (VTE).  What do you need to know about  xarelto ? Take your Xarelto ONCE DAILY at the same time every day. You may take it either with or without food.  If you have difficulty swallowing the tablet whole, you may crush it and mix in applesauce just prior to taking your dose.  Take Xarelto exactly as prescribed by your doctor and DO NOT stop taking Xarelto without talking to the doctor who prescribed the medication.  Stopping without other VTE prevention medication to take the place of Xarelto may increase your risk of developing a clot.  After discharge, you should have regular check-up appointments with your healthcare provider that is prescribing your Xarelto.    What do you do if you miss a dose? If you miss a dose, take it as soon as you remember on the same day then continue your regularly scheduled once daily regimen the next day. Do not take two doses of Xarelto on the same day.   Important Safety Information A possible side effect of Xarelto is bleeding. You should call your healthcare  provider right away if you experience any of the following:   Bleeding from an injury or your nose that does not stop.   Unusual colored urine (red or dark brown) or unusual colored stools (red or black).   Unusual bruising for unknown reasons.   A serious fall or if you hit your head (even if there is no bleeding).  Some medicines may interact with Xarelto and might increase your risk of bleeding while on Xarelto. To help avoid this, consult your healthcare provider or pharmacist prior to using any new prescription or non-prescription medications, including herbals, vitamins, non-steroidal anti-inflammatory drugs (NSAIDs) and supplements.  This website has more information on Xarelto: [email or web address]-benson.[email or web address]

## 2014-05-25 NOTE — Plan of Care (Signed)
Problem: Phase III Progression Outcomes Goal: Anticoagulant follow-up in place Outcome: Not Applicable Date Met:  04/02/82 Xarelto VTE, no f/u needed.

## 2014-05-25 NOTE — Progress Notes (Signed)
05/25/14 07:30 CM met with pt in room to offer choice of home health agency.  Pt chooses [facility] to render HHPT.  Pt states she has both a rolling walker and 3n1 at home.  Address and contact information verified with pt.  Referral called to Sha[facility] Leeks.  No other CM needs were communicated.  Mariane Masters, BSN, CM [phone].

## 2014-05-26 ENCOUNTER — Emergency Department (HOSPITAL_COMMUNITY): Payer: Managed Care, Other (non HMO)

## 2014-05-26 ENCOUNTER — Inpatient Hospital Stay (HOSPITAL_COMMUNITY)
Admission: EM | Admit: 2014-05-26 | Discharge: 2014-05-30 | DRG: 871 | Disposition: A | Discharge status: Home Health | Payer: Managed Care, Other (non HMO) | Attending: Internal Medicine | Admitting: Internal Medicine

## 2014-05-26 ENCOUNTER — Encounter (HOSPITAL_COMMUNITY): Payer: Self-pay | Admitting: Emergency Medicine

## 2014-05-26 DIAGNOSIS — Z96641 Presence of right artificial hip joint: Secondary | ICD-10-CM | POA: Diagnosis present

## 2014-05-26 DIAGNOSIS — A419 Sepsis, unspecified organism: Principal | ICD-10-CM | POA: Diagnosis present

## 2014-05-26 DIAGNOSIS — G5602 Carpal tunnel syndrome, left upper limb: Secondary | ICD-10-CM | POA: Diagnosis present

## 2014-05-26 DIAGNOSIS — E785 Hyperlipidemia, unspecified: Secondary | ICD-10-CM | POA: Diagnosis present

## 2014-05-26 DIAGNOSIS — Z881 Allergy status to other antibiotic agents status: Secondary | ICD-10-CM

## 2014-05-26 DIAGNOSIS — Z955 Presence of coronary angioplasty implant and graft: Secondary | ICD-10-CM

## 2014-05-26 DIAGNOSIS — I2510 Atherosclerotic heart disease of native coronary artery without angina pectoris: Secondary | ICD-10-CM | POA: Diagnosis present

## 2014-05-26 DIAGNOSIS — G5601 Carpal tunnel syndrome, right upper limb: Secondary | ICD-10-CM | POA: Diagnosis present

## 2014-05-26 DIAGNOSIS — M13862 Other specified arthritis, left knee: Secondary | ICD-10-CM | POA: Diagnosis present

## 2014-05-26 DIAGNOSIS — I252 Old myocardial infarction: Secondary | ICD-10-CM

## 2014-05-26 DIAGNOSIS — J189 Pneumonia, unspecified organism: Secondary | ICD-10-CM | POA: Diagnosis present

## 2014-05-26 DIAGNOSIS — Z882 Allergy status to sulfonamides status: Secondary | ICD-10-CM

## 2014-05-26 DIAGNOSIS — I10 Essential (primary) hypertension: Secondary | ICD-10-CM | POA: Diagnosis present

## 2014-05-26 DIAGNOSIS — M25551 Pain in right hip: Secondary | ICD-10-CM | POA: Diagnosis present

## 2014-05-26 DIAGNOSIS — M13861 Other specified arthritis, right knee: Secondary | ICD-10-CM | POA: Diagnosis present

## 2014-05-26 DIAGNOSIS — Z8249 Family history of ischemic heart disease and other diseases of the circulatory system: Secondary | ICD-10-CM

## 2014-05-26 DIAGNOSIS — K219 Gastro-esophageal reflux disease without esophagitis: Secondary | ICD-10-CM | POA: Diagnosis present

## 2014-05-26 DIAGNOSIS — Z7901 Long term (current) use of anticoagulants: Secondary | ICD-10-CM

## 2014-05-26 DIAGNOSIS — Y95 Nosocomial condition: Secondary | ICD-10-CM | POA: Diagnosis present

## 2014-05-26 DIAGNOSIS — G894 Chronic pain syndrome: Secondary | ICD-10-CM | POA: Diagnosis present

## 2014-05-26 DIAGNOSIS — M549 Dorsalgia, unspecified: Secondary | ICD-10-CM | POA: Diagnosis present

## 2014-05-26 DIAGNOSIS — R5082 Postprocedural fever: Secondary | ICD-10-CM

## 2014-05-26 DIAGNOSIS — N390 Urinary tract infection, site not specified: Secondary | ICD-10-CM | POA: Diagnosis present

## 2014-05-26 DIAGNOSIS — M1611 Unilateral primary osteoarthritis, right hip: Secondary | ICD-10-CM

## 2014-05-26 DIAGNOSIS — D500 Iron deficiency anemia secondary to blood loss (chronic): Secondary | ICD-10-CM | POA: Diagnosis present

## 2014-05-26 DIAGNOSIS — Z9889 Other specified postprocedural states: Secondary | ICD-10-CM

## 2014-05-26 DIAGNOSIS — M4690 Unspecified inflammatory spondylopathy, site unspecified: Secondary | ICD-10-CM | POA: Diagnosis present

## 2014-05-26 DIAGNOSIS — D62 Acute posthemorrhagic anemia: Secondary | ICD-10-CM | POA: Diagnosis present

## 2014-05-26 DIAGNOSIS — Z79899 Other long term (current) drug therapy: Secondary | ICD-10-CM

## 2014-05-26 DIAGNOSIS — F329 Major depressive disorder, single episode, unspecified: Secondary | ICD-10-CM | POA: Diagnosis present

## 2014-05-26 DIAGNOSIS — F17210 Nicotine dependence, cigarettes, uncomplicated: Secondary | ICD-10-CM | POA: Diagnosis present

## 2014-05-26 DIAGNOSIS — N898 Other specified noninflammatory disorders of vagina: Secondary | ICD-10-CM

## 2014-05-26 DIAGNOSIS — Z885 Allergy status to narcotic agent status: Secondary | ICD-10-CM

## 2014-05-26 DIAGNOSIS — Z880 Allergy status to penicillin: Secondary | ICD-10-CM

## 2014-05-26 LAB — BASIC METABOLIC PANEL
Anion gap: 10 (ref 5–15)
BUN: 6 mg/dL (ref 6–23)
CO2: 26 meq/L (ref 19–32)
Calcium: 8.7 mg/dL (ref 8.4–10.5)
Chloride: 101 mEq/L (ref 96–112)
Creatinine, Ser: 0.61 mg/dL (ref 0.50–1.10)
GFR calc Af Amer: >90 mL/min (ref >90)
GFR calc non Af Amer: >90 mL/min (ref >90)
GLUCOSE: 129 mg/dL — AB (ref 70–99)
Potassium: 4.2 mEq/L (ref 3.7–5.3)
SODIUM: 137 meq/L (ref 137–147)

## 2014-05-26 LAB — CBC
HEMATOCRIT: 22.6 % — AB (ref 36.0–46.0)
HEMOGLOBIN: 7.9 g/dL — AB (ref 12.0–15.0)
MCH: 31 pg (ref 26.0–34.0)
MCHC: 35 g/dL (ref 30.0–36.0)
MCV: 88.6 fL (ref 78.0–100.0)
Platelets: 241 10*3/uL (ref 150–400)
RBC: 2.55 MIL/uL — AB (ref 3.87–5.11)
RDW: 12.6 % (ref 11.5–15.5)
WBC: 11.7 10*3/uL — ABNORMAL HIGH (ref 4.0–10.5)

## 2014-05-26 MED ORDER — RIVAROXABAN 10 MG PO TABS: 10.0000 mg | ORAL_TABLET | Freq: Every day | ORAL | Status: DC

## 2014-05-26 MED ORDER — TRAMADOL HCL 50 MG PO TABS: 50.0000 mg | ORAL_TABLET | Freq: Four times a day (QID) | ORAL | Status: DC | PRN

## 2014-05-26 MED ORDER — HYDROMORPHONE HCL 2 MG PO TABS: 2.0000 mg | ORAL_TABLET | ORAL | Status: DC | PRN

## 2014-05-26 MED ORDER — METHOCARBAMOL 500 MG PO TABS: 500.0000 mg | ORAL_TABLET | Freq: Four times a day (QID) | ORAL | Status: DC | PRN

## 2014-05-26 MED ORDER — FERROUS SULFATE 325 (65 FE) MG PO TABS: 325.0000 mg | ORAL_TABLET | Freq: Two times a day (BID) | ORAL | Status: DC

## 2014-05-26 NOTE — ED Notes (Signed)
Bed: WA01 Expected date: 05/26/14 Expected time: 9:43 PM Means of arrival: Ambulance Comments: Bed 1, EMS, 68 F, Nausea

## 2014-05-26 NOTE — Progress Notes (Signed)
Subjective: 2 Days Post-Op Procedure(s) (LRB): RIGHT TOTAL HIP ARTHROPLASTY ANTERIOR APPROACH (Right) Patient reports pain as mild.   Patient seen in rounds with Dr. Wynelle Link. She has some rib soreness but no chest pain or SOB.  Denies nausea or vomiting. Patient is well, but has had some minor complaints of pain in the hip, requiring pain medications Patient is ready to go home after therapy.  HGB is down to 7.9 but she is asymptomatic at this time.  She walked 200 feet yesterday.  Will see how she does with PT this morning.  Plan on home after therapy session.  Objective: Vital signs in last 24 hours: Temp:  [98.1 F (36.7 C)-98.6 F (37 C)] 98.1 F (36.7 C) (10/09 0556) Pulse Rate:  [62-70] 67 (10/09 0556) Resp:  [16-20] 16 (10/09 0556) BP: (100-139)/(46-59) 139/59 mmHg (10/09 0556) SpO2:  [98 %-99 %] 98 % (10/09 0556)  Intake/Output from previous day:  Intake/Output Summary (Last 24 hours) at 05/26/14 0745 Last data filed at 05/26/14 0557  Gross per 24 hour  Intake   2300 ml  Output   1450 ml  Net    850 ml    Labs:  Recent Labs  05/25/14 0500 05/26/14 0430  HGB 8.6* 7.9*    Recent Labs  05/25/14 0500 05/26/14 0430  WBC 9.5 11.7*  RBC 2.82* 2.55*  HCT 24.5* 22.6*  PLT 217 241    Recent Labs  05/25/14 0500 05/26/14 0430  NA 127* 137  K 4.5 4.2  CL 94* 101  CO2 25 26  BUN 4* 6  CREATININE 0.57 0.61  GLUCOSE 130* 129*  CALCIUM 8.6 8.7   No results found for this basename: LABPT, INR,  in the last 72 hours  EXAM: General - Patient is Alert, Appropriate and Oriented Extremity - Neurovascular intact Sensation intact distally Dorsiflexion/Plantar flexion intact Incision - clean, dry, no drainage, healing Motor Function - intact, moving foot and toes well on exam.   Assessment/Plan: 2 Days Post-Op Procedure(s) (LRB): RIGHT TOTAL HIP ARTHROPLASTY ANTERIOR APPROACH (Right) Procedure(s) (LRB): RIGHT TOTAL HIP ARTHROPLASTY ANTERIOR APPROACH  (Right) Past Medical History  Diagnosis Date  . CAD (coronary artery disease)     a. 1997 MI/PCI RCA;  b. 2007 MI/PCI of 100% RCA with Taxus DES x 3 placed, EF 55%;  c. 2010 Cath: stable anatomy;  d. 05/2012 NSTEMI/Cath/PCI: LM nl, LAD 60-59m, D1 20ost, LCX 23m, RCA 40-54m ISR, 60/95d (Treated w/ 2.75x33 Xience Xpedition DES), PDA 81m (Treated w/ PTCA), EF 60%.  . Depression   . Hyperlipidemia   . Carpal tunnel syndrome on both sides   . Numbness of foot left foot    "never recovered from last back OR, 2009" (05/17/2012)  . GERD (gastroesophageal reflux disease)   . Arthritis     "back; probably knees" (05/17/2012)  . Chronic lower back pain   . Anginal pain   . Myocardial infarction  1997, 2007 , last 2013    x 3   Principal Problem:   OA (osteoarthritis) of hip  Estimated body mass index is 28.42 kg/(m^2) as calculated from the following:   Height as of this encounter: 5' 2.5" (1.588 m).   Weight as of this encounter: 71.668 kg (158 lb). Up with therapy Discharge home with home health Diet - Cardiac diet Follow up - in 2 weeks on Tuesday Oct 20th. Activity - WBAT Disposition - Home Condition Upon Discharge - Good D/C Meds - See DC Summary DVT Prophylaxis -  [place], PA-C Orthopaedic Surgery 05/26/2014, 7:45 AM

## 2014-05-26 NOTE — Evaluation (Addendum)
Occupational Therapy Evaluation Patient Details Name: Bianca [name] MRN: [id number] DOB: [birth year] Today's Date: 05/26/2014    History of Present Illness R THR   Clinical Impression   This 68 year old female was admitted for the above surgery.  All education was completed, although some of it was verbal education as pt was limited by pain.  No further OT is needed at this time.  Recommend initial 24/7    Follow Up Recommendations  Supervision/Assistance - 24 hour    Equipment Recommendations  None recommended by OT    Recommendations for Other Services       Precautions / Restrictions Precautions Precautions: Fall Restrictions Other Position/Activity Restrictions: WBAT      Mobility Bed Mobility                  Transfers   Equipment used: Rolling walker (2 wheeled) Transfers: Sit to/from [employer] to Stand: Min guard Stand pivot transfers: Min guard       General transfer comment: cues for UE/LE placement    Balance                                            ADL Overall ADL's : Needs assistance/impaired                         Toilet Transfer: Min guard;Stand-pivot;RW             General ADL Comments: Began to ambulate to bathroom, but pt's pain high.  Performed SPT to 3:1 instead.  Pt needed cues for UE/LE placement for each sit to stand--she did not carry over.  Educated on shower transfer, stepping backwards and coming out forward.  Pt in too much pain to practice.  She has long sponge and reacher at home.  Pt states she has used these for adls in the past. Pt has a shower with seat upstairs.  She plans to stay upstairs for the weekend.   Husband present during session     Vision                     Perception     Praxis      Pertinent Vitals/Pain Pain Score: 8  Pain Descriptors / Indicators: Aching Pain Intervention(s): Limited activity within patient's tolerance;Monitored  during session     Hand Dominance     Extremity/Trunk Assessment Upper Extremity Assessment Upper Extremity Assessment: Overall WFL for tasks assessed           Communication Communication Communication: No difficulties   Cognition Arousal/Alertness: Awake/alert Behavior During Therapy: WFL for tasks assessed/performed Overall Cognitive Status: Impaired/Different from baseline (following commands but not carrying over)                     General Comments       Exercises       Shoulder Instructions      Home Living Family/patient expects to be discharged to:: Private residence Living Arrangements: Spouse/significant other Available Help at Discharge: Family Type of Home: House Home Access: Stairs to enter Technical brewer of Steps: 2+1 Entrance Stairs-Rails: Right;Left;Can reach both Home Layout: Two level     Bathroom Shower/Tub: Occupational psychologist: Handicapped height     Home Equipment: Bedside commode;Shower seat - built in  Prior Functioning/Environment Level of Independence: Independent             OT Diagnosis: Generalized weakness   OT Problem List:     OT Treatment/Interventions:      OT Goals(Current goals can be found in the care plan section)    OT Frequency:     Barriers to D/C:            Co-evaluation              End of Session    Activity Tolerance: Patient limited by pain Patient left: in chair;with call bell/phone within reach;with family/visitor present   Time: 4174-0814 OT Time Calculation (min): 13 min Charges:  OT General Charges $OT Visit: 1 Procedure OT Evaluation $Initial OT Evaluation Tier I: 1 Procedure Treatment 1 self care  G-Codes:    Britney Newstrom 06/14/14, 11:24 AM  Lesle Chris, OTR/L [phone] 06-14-14

## 2014-05-26 NOTE — Discharge Summary (Signed)
Pt. VSS. Pt managing pain well with prescribed pain medication. No noted respiratory distress. No noted problems with transferring from sitting to standing or walking. Pt received discharge instructions and prescribed prescriptions to be picked up from pharmacy. Pt concerns were addressed prior to discharge.   Pt was discharged in wheelchair with significant other.

## 2014-05-26 NOTE — Discharge Summary (Signed)
Physician Discharge Summary   Patient ID: Bianca [name] MRN: [id number] DOB/AGE: [birth year] 68 y.o.  Admit date: 05/24/2014 Discharge date: 05/26/2014  Primary Diagnosis:  Osteoarthritis of the Right hip.   Admission Diagnoses:  Past Medical History  Diagnosis Date  . CAD (coronary artery disease)     a. 1997 MI/PCI RCA;  b. 2007 MI/PCI of 100% RCA with Taxus DES x 3 placed, EF 55%;  c. 2010 Cath: stable anatomy;  d. 05/2012 NSTEMI/Cath/PCI: LM nl, LAD 60-94m D1 20ost, LCX 538mRCA 40-5035mR, 60/95d (Treated w/ 2.75x33 Xience Xpedition DES), PDA 94m38meated w/ PTCA), EF 60%.  . Depression   . Hyperlipidemia   . Carpal tunnel syndrome on both sides   . Numbness of foot left foot    "never recovered from last back OR, 2009" (05/17/2012)  . GERD (gastroesophageal reflux disease)   . Arthritis     "back; probably knees" (05/17/2012)  . Chronic lower back pain   . Anginal pain   . Myocardial infarction  1997, 2007 , last 2013    x 3   Discharge Diagnoses:   Principal Problem:   OA (osteoarthritis) of hip  Estimated body mass index is 28.42 kg/(m^2) as calculated from the following:   Height as of this encounter: 5' 2.5" (1.588 m).   Weight as of this encounter: 71.668 kg (158 lb).  Procedure(s) (LRB): RIGHT TOTAL HIP ARTHROPLASTY ANTERIOR APPROACH (Right)   Consults: None  HPI: Bianca LIZARDOa 68 y70. female who has advanced end-  stage arthritis of her Right hip with progressively worsening pain and  dysfunction.The patient has failed nonoperative management and presents for  total hip arthroplasty.   Laboratory Data: Admission on 05/24/2014  Component Date Value Ref Range Status  . WBC 05/25/2014 9.5  4.0 - 10.5 K/uL Final  . RBC 05/25/2014 2.82* 3.87 - 5.11 MIL/uL Final  . Hemoglobin 05/25/2014 8.6* 12.0 - 15.0 g/dL Final  . HCT 05/25/2014 24.5* 36.0 - 46.0 % Final  . MCV 05/25/2014 86.9  78.0 - 100.0 fL Final  . MCH 05/25/2014 30.5  26.0 - 34.0 pg Final  .  MCHC 05/25/2014 35.1  30.0 - 36.0 g/dL Final  . RDW 05/25/2014 12.0  11.5 - 15.5 % Final  . Platelets 05/25/2014 217  150 - 400 K/uL Final  . Sodium 05/25/2014 127* 137 - 147 mEq/L Final  . Potassium 05/25/2014 4.5  3.7 - 5.3 mEq/L Final  . Chloride 05/25/2014 94* 96 - 112 mEq/L Final  . CO2 05/25/2014 25  19 - 32 mEq/L Final  . Glucose, Bld 05/25/2014 130* 70 - 99 mg/dL Final  . BUN 05/25/2014 4* 6 - 23 mg/dL Final  . Creatinine, Ser 05/25/2014 0.57  0.50 - 1.10 mg/dL Final  . Calcium 05/25/2014 8.6  8.4 - 10.5 mg/dL Final  . GFR calc non Af Amer 05/25/2014 >90  >90 mL/min Final  . GFR calc Af Amer 05/25/2014 >90  >90 mL/min Final   Comment: (NOTE)                          The eGFR has been calculated using the CKD EPI equation.                          This calculation has not been validated in all clinical situations.  eGFR's persistently <90 mL/min signify possible Chronic Kidney                          Disease.  . Anion gap 05/25/2014 8  5 - 15 Final  . WBC 05/26/2014 11.7* 4.0 - 10.5 K/uL Final  . RBC 05/26/2014 2.55* 3.87 - 5.11 MIL/uL Final  . Hemoglobin 05/26/2014 7.9* 12.0 - 15.0 g/dL Final  . HCT 05/26/2014 22.6* 36.0 - 46.0 % Final  . MCV 05/26/2014 88.6  78.0 - 100.0 fL Final  . MCH 05/26/2014 31.0  26.0 - 34.0 pg Final  . MCHC 05/26/2014 35.0  30.0 - 36.0 g/dL Final  . RDW 05/26/2014 12.6  11.5 - 15.5 % Final  . Platelets 05/26/2014 241  150 - 400 K/uL Final  . Sodium 05/26/2014 137  137 - 147 mEq/L Final   Comment: DELTA CHECK NOTED                          REPEATED TO VERIFY  . Potassium 05/26/2014 4.2  3.7 - 5.3 mEq/L Final  . Chloride 05/26/2014 101  96 - 112 mEq/L Final  . CO2 05/26/2014 26  19 - 32 mEq/L Final  . Glucose, Bld 05/26/2014 129* 70 - 99 mg/dL Final  . BUN 05/26/2014 6  6 - 23 mg/dL Final  . Creatinine, Ser 05/26/2014 0.61  0.50 - 1.10 mg/dL Final  . Calcium 05/26/2014 8.7  8.4 - 10.5 mg/dL Final  . GFR calc non Af Amer  05/26/2014 >90  >90 mL/min Final  . GFR calc Af Amer 05/26/2014 >90  >90 mL/min Final   Comment: (NOTE)                          The eGFR has been calculated using the CKD EPI equation.                          This calculation has not been validated in all clinical situations.                          eGFR's persistently <90 mL/min signify possible Chronic Kidney                          Disease.  Georgiann Hahn gap 05/26/2014 10  5 - 15 Final  Hospital Outpatient Visit on 05/19/2014  Component Date Value Ref Range Status  . aPTT 05/19/2014 41* 24 - 37 seconds Final   Comment:                                 IF BASELINE aPTT IS ELEVATED,                          SUGGEST PATIENT RISK ASSESSMENT                          BE USED TO DETERMINE APPROPRIATE                          ANTICOAGULANT THERAPY.  . WBC 05/19/2014 6.8  4.0 - 10.5 K/uL Final  . RBC 05/19/2014 4.23  3.87 -  5.11 MIL/uL Final  . Hemoglobin 05/19/2014 12.9  12.0 - 15.0 g/dL Final  . HCT 05/19/2014 37.5  36.0 - 46.0 % Final  . MCV 05/19/2014 88.7  78.0 - 100.0 fL Final  . MCH 05/19/2014 30.5  26.0 - 34.0 pg Final  . MCHC 05/19/2014 34.4  30.0 - 36.0 g/dL Final  . RDW 05/19/2014 12.3  11.5 - 15.5 % Final  . Platelets 05/19/2014 236  150 - 400 K/uL Final  . Sodium 05/19/2014 131* 137 - 147 mEq/L Final  . Potassium 05/19/2014 4.1  3.7 - 5.3 mEq/L Final  . Chloride 05/19/2014 92* 96 - 112 mEq/L Final  . CO2 05/19/2014 26  19 - 32 mEq/L Final  . Glucose, Bld 05/19/2014 105* 70 - 99 mg/dL Final  . BUN 05/19/2014 7  6 - 23 mg/dL Final  . Creatinine, Ser 05/19/2014 0.67  0.50 - 1.10 mg/dL Final  . Calcium 05/19/2014 9.8  8.4 - 10.5 mg/dL Final  . Total Protein 05/19/2014 7.8  6.0 - 8.3 g/dL Final  . Albumin 05/19/2014 3.7  3.5 - 5.2 g/dL Final  . AST 05/19/2014 24  0 - 37 U/L Final  . ALT 05/19/2014 12  0 - 35 U/L Final  . Alkaline Phosphatase 05/19/2014 98  39 - 117 U/L Final  . Total Bilirubin 05/19/2014 0.6  0.3 - 1.2 mg/dL  Final  . GFR calc non Af Amer 05/19/2014 88* >90 mL/min Final  . GFR calc Af Amer 05/19/2014 >90  >90 mL/min Final   Comment: (NOTE)                          The eGFR has been calculated using the CKD EPI equation.                          This calculation has not been validated in all clinical situations.                          eGFR's persistently <90 mL/min signify possible Chronic Kidney                          Disease.  . Anion gap 05/19/2014 13  5 - 15 Final  . Prothrombin Time 05/19/2014 14.0  11.6 - 15.2 seconds Final  . INR 05/19/2014 1.07  0.00 - 1.49 Final  . ABO/RH(D) 05/19/2014 O POS   Final  . Antibody Screen 05/19/2014 NEG   Final  . Sample Expiration 05/19/2014 05/27/2014   Final  . Color, Urine 05/19/2014 YELLOW  YELLOW Final  . APPearance 05/19/2014 CLEAR  CLEAR Final  . Specific Gravity, Urine 05/19/2014 1.003* 1.005 - 1.030 Final  . pH 05/19/2014 5.5  5.0 - 8.0 Final  . Glucose, UA 05/19/2014 NEGATIVE  NEGATIVE mg/dL Final  . Hgb urine dipstick 05/19/2014 SMALL* NEGATIVE Final  . Bilirubin Urine 05/19/2014 NEGATIVE  NEGATIVE Final  . Ketones, ur 05/19/2014 NEGATIVE  NEGATIVE mg/dL Final  . Protein, ur 05/19/2014 NEGATIVE  NEGATIVE mg/dL Final  . Urobilinogen, UA 05/19/2014 0.2  0.0 - 1.0 mg/dL Final  . Nitrite 05/19/2014 NEGATIVE  NEGATIVE Final  . Leukocytes, UA 05/19/2014 NEGATIVE  NEGATIVE Final  . MRSA, PCR 05/19/2014 NEGATIVE  NEGATIVE Final  . Staphylococcus aureus 05/19/2014 NEGATIVE  NEGATIVE Final   Comment:  The Xpert SA Assay (FDA                          approved for NASAL specimens                          in patients over 26 years of age),                          is one component of                          a comprehensive surveillance                          program.  Test performance has                          been validated by [employer] for patients greater                           than or equal to 88 year old.                          It is not intended                          to diagnose infection nor to                          guide or monitor treatment.  . Squamous Epithelial / LPF 05/19/2014 FEW* RARE Final  . RBC / HPF 05/19/2014 0-2  <3 RBC/hpf Final  . ABO/RH(D) 05/19/2014 O POS   Final     X-Rays:Dg Hip Complete Right  05/19/2014   CLINICAL DATA:  Preoperative evaluation for right hip replacement, osteoarthritis of the hip  EXAM: RIGHT HIP - COMPLETE 2+ VIEW  COMPARISON:  None.  FINDINGS: Degenerative changes of the right hip joint are noted with remodeling of the femoral head. Subchondral sclerosis and cyst formation is noted in both the femoral head and superior acetabulum. No acute fracture is seen. Overhanging osteophytes are noted. Lesser osteoarthritic changes are noted in the left hip. The pelvic ring is intact. Degenerative change of the lumbar spine is noted.  IMPRESSION: Osteoarthritic changes right greater than left within the hip joints. No acute abnormality is noted.   Electronically Signed   By: Inez Catalina M.D.   On: 05/19/2014 15:34   Dg Pelvis Portable  05/24/2014   CLINICAL DATA:  Status post total hip arthroplasty  EXAM: DG C-ARM 1-60 MIN - NRPT [facility]; PORTABLE PELVIS 1-2 VIEWS  COMPARISON:  None.  FINDINGS: Frontal view shows total hip replacement on the right. Prosthetic components appear well seated. No fracture or dislocation. There is a surgical drain in the lateral right hip joint region. There is moderate narrowing of the left hip joint.  IMPRESSION: Total hip arthroplasty components appear well seated on the right. No fracture or dislocation. Moderate narrowing left hip joint.   Electronically Signed   By: Gwyndolyn Saxon  Jasmine December M.D.   On: 05/24/2014 15:22   Dg C-arm 1-60 Min-no Report  05/24/2014   CLINICAL DATA:  Status post total hip arthroplasty  EXAM: DG C-ARM 1-60 MIN - NRPT [facility]; PORTABLE PELVIS 1-2 VIEWS  COMPARISON:  None.  FINDINGS:  Frontal view shows total hip replacement on the right. Prosthetic components appear well seated. No fracture or dislocation. There is a surgical drain in the lateral right hip joint region. There is moderate narrowing of the left hip joint.  IMPRESSION: Total hip arthroplasty components appear well seated on the right. No fracture or dislocation. Moderate narrowing left hip joint.   Electronically Signed   By: Lowella Grip M.D.   On: 05/24/2014 15:22   Conde Cm  05/10/2014   CLINICAL DATA:  Persistent sinus pressure in the frontal and maxillary sinuses for 3 weeks. Nasal congestion.  EXAM: CT PARANASAL SINUS WITHOUT CONTRAST  TECHNIQUE: Multidetector CT images of the paranasal sinuses were obtained using the standard protocol without intravenous contrast.  COMPARISON:  CT scan dated 01/13/2008  FINDINGS: There is complete opacification of the right maxillary sinus as well as partial opacification of the anterior ethmoid air cells and complete opacification of the right side of the frontal sinus. In addition, there is periapical lucency around the roots of tooth number 3.  There is a 2.7 mm osteoma in the midline of the frontal sinus, not significant. The other paranasal sinuses are clear. Mastoid air cells and middle ear cavities are clear.  IMPRESSION: Periapical abscess involving the roots of tooth 3. This appears to extend into the base of the right maxillary sinus. Adjacent opacification of right frontal sinus and right ethmoid sinus anterior ethmoid sinuses may be secondary to the inflammation in the right maxillary sinus. I suspect all of the findings may be secondary to the periapical abscess.   Electronically Signed   By: Rozetta Nunnery M.D.   On: 05/10/2014 10:28    EKG: Orders placed during the hospital encounter of 12/19/13  . EKG 12-LEAD  . EKG 12-LEAD  . EXERCISE TOLERANCE TEST  . EXERCISE TOLERANCE TEST  . EKG     Hospital Course: Patient was admitted to [facility] and taken to the OR and underwent the above state procedure without complications.  Patient tolerated the procedure well and was later transferred to the recovery room and then to the orthopaedic floor for postoperative care.  They were given PO and IV analgesics for pain control following their surgery.  They were given 24 hours of postoperative antibiotics of  Anti-infectives   Start     Dose/Rate Route Frequency Ordered Stop   05/25/14 1000  fluconazole (DIFLUCAN) tablet 100 mg     100 mg Oral Daily 05/25/14 0641     05/25/14 0100  vancomycin (VANCOCIN) IVPB 1000 mg/200 mL premix     1,000 mg 200 mL/hr over 60 Minutes Intravenous Every 12 hours 05/24/14 1641 05/25/14 0119   05/24/14 1044  vancomycin (VANCOCIN) IVPB 1000 mg/200 mL premix     1,000 mg 200 mL/hr over 60 Minutes Intravenous On call to O.R. 05/24/14 1044 05/24/14 1257     and started on DVT prophylaxis in the form of Xarelto. Her Plavix and Aspirin were placed on hold temporarily for thre three weeks while on the Xarelto.  PT and OT were ordered for total hip protocol.  The patient was allowed to be WBAT with therapy. Discharge planning was consulted to help with postop disposition  and equipment needs.  Patient had a decent night on the evening of surgery.  They started to get up OOB with therapy on day one.  Hemovac drain was pulled without difficulty.  Continued to work with therapy into day two.  Dressing was changed on day two and the incision was healing well.  Patient was seen in rounds on POD 2 by Dr. Wynelle Link.  HGB was noted to be lower at 7.9 but the patient was asymptomatic.  She walked 200 feet the day before and had no issues.  Planned on one more session of therapy and as long as she did well and then ready to go home.  Discharge home with home health  Diet - Cardiac diet  Follow up - in 2 weeks on Tuesday Oct 20th.  Activity - WBAT  Disposition - Home  Condition Upon Discharge - Good  D/C Meds - See DC Summary   DVT Prophylaxis - Xarelto for a total of 21 days following surgery and then she will resume her Plavix and her baby 81 mg Aspirin at the completion of the Xarelto.       Discharge Instructions   Call MD / Call 911    Complete by:  As directed   If you experience chest pain or shortness of breath, CALL 911 and be transported to the hospital emergency room.  If you develope a fever above 101 F, pus (white drainage) or increased drainage or redness at the wound, or calf pain, call your surgeon's office.     Change dressing    Complete by:  As directed   You may change your dressing dressing daily with sterile 4 x 4 inch gauze dressing and paper tape.  Do not submerge the incision under water.     Constipation Prevention    Complete by:  As directed   Drink plenty of fluids.  Prune juice may be helpful.  You may use a stool softener, such as Colace (over the counter) 100 mg twice a day.  Use MiraLax (over the counter) for constipation as needed.     Diet - low sodium heart healthy    Complete by:  As directed      Discharge instructions    Complete by:  As directed   Pick up stool softner and laxative for home. Do not submerge incision under water. May shower. Continue to use ice for pain and swelling from surgery.  Total Hip Protocol.  Take Xarelto for two and a half more weeks, then discontinue Xarelto. Once the patient has completed the Xarelto, they may resume the 81 mg Aspirin and the 75 mg Plavix.     Do not sit on low chairs, stoools or toilet seats, as it may be difficult to get up from low surfaces    Complete by:  As directed      Driving restrictions    Complete by:  As directed   No driving until released by the physician.     Increase activity slowly as tolerated    Complete by:  As directed      Lifting restrictions    Complete by:  As directed   No lifting until released by the physician.     Patient may shower    Complete by:  As directed   You may shower without  a dressing once there is no drainage.  Do not wash over the wound.  If drainage remains, do not shower until drainage stops.  TED hose    Complete by:  As directed   Use stockings (TED hose) for 3 weeks on both leg(s).  You may remove them at night for sleeping.     Weight bearing as tolerated    Complete by:  As directed             Medication List    STOP taking these medications       aspirin EC 81 MG tablet     clopidogrel 75 MG tablet  Commonly known as:  PLAVIX     ESTRACE VAGINAL 0.1 MG/GM vaginal cream  Generic drug:  estradiol     levofloxacin 500 MG tablet  Commonly known as:  LEVAQUIN      TAKE these medications       escitalopram 20 MG tablet  Commonly known as:  LEXAPRO  Take 20 mg by mouth every morning.     ferrous sulfate 325 (65 FE) MG tablet  Commonly known as:  FERROUSUL  Take 1 tablet (325 mg total) by mouth 2 (two) times daily with a meal. PICK UP OVER THE COUNTER IRON SUPPLEMENT     fluticasone 50 MCG/ACT nasal spray  Commonly known as:  FLONASE  Place 2 sprays into both nostrils daily as needed for allergies or rhinitis.     HYDROmorphone 2 MG tablet  Commonly known as:  DILAUDID  Take 1-2 tablets (2-4 mg total) by mouth every 4 (four) hours as needed for moderate pain or severe pain.     magic mouthwash w/lidocaine Soln  Take 5 mLs by mouth 4 (four) times daily as needed for mouth pain.     methocarbamol 500 MG tablet  Commonly known as:  ROBAXIN  Take 1 tablet (500 mg total) by mouth every 6 (six) hours as needed for muscle spasms.     metoprolol 50 MG tablet  Commonly known as:  LOPRESSOR  Take 25 mg by mouth 2 (two) times daily.     nitroGLYCERIN 0.4 MG SL tablet  Commonly known as:  NITROSTAT  Place 0.4 mg under the tongue every 5 (five) minutes as needed for chest pain.     pantoprazole 40 MG tablet  Commonly known as:  PROTONIX  Take 40 mg by mouth every evening.     rivaroxaban 10 MG Tabs tablet  Commonly known as:   XARELTO  - Take 1 tablet (10 mg total) by mouth daily with breakfast. Take Xarelto for two and a half more weeks, then discontinue Xarelto.  - Once the patient has completed the Xarelto, they may resume the 81 mg Aspirin and the 75 mg Plavix.     simvastatin 80 MG tablet  Commonly known as:  ZOCOR  Take 80 mg by mouth at bedtime.     traMADol 50 MG tablet  Commonly known as:  ULTRAM  Take 1-2 tablets (50-100 mg total) by mouth every 6 (six) hours as needed (mild pain).     zolpidem 10 MG tablet  Commonly known as:  AMBIEN  Take 10 mg by mouth at bedtime as needed for sleep.       Follow-up Information   Follow up with [facility] (home health physical therapy)    Contact information:   [address] [place] [address] [phone]       Follow up with Gearlean Alf, MD. Schedule an appointment as soon as possible for a visit on 06/06/2014. (Call office at [phone] to set up follow up  appointment on Tuesday October 20th.)    Specialty:  Orthopedic Surgery   Contact information:   [address] [place] [address] [phone]       Signed: Arlee Muslim, PA-C Orthopaedic Surgery 05/26/2014, 7:59 AM

## 2014-05-26 NOTE — Progress Notes (Signed)
Physical Therapy Treatment Patient Details Name: PIERRETTE SCHEU MRN: [id number] DOB: 1946/07/10 Today's Date: 05/26/2014    History of Present Illness R THR    PT Comments    Progressing well with no c/o dizziness.  Reviewed stairs and car transfers with pt.  Follow Up Recommendations  Home health PT     Equipment Recommendations  None recommended by PT    Recommendations for Other Services OT consult     Precautions / Restrictions Precautions Precautions: Fall Restrictions Weight Bearing Restrictions: No Other Position/Activity Restrictions: WBAT    Mobility  Bed Mobility Overal bed mobility: Needs Assistance Bed Mobility: Supine to Sit     Supine to sit: Min assist     General bed mobility comments: cues for sequence and use of L LE to self assist  Transfers Overall transfer level: Needs assistance Equipment used: Rolling walker (2 wheeled) Transfers: Sit to/from [facility] Sit to Stand: Min guard Stand pivot transfers: Min guard       General transfer comment: cues for UE/LE placement  Ambulation/Gait Ambulation/Gait assistance: Min guard;Supervision Ambulation Distance (Feet): 80 Feet Assistive device: Rolling walker (2 wheeled) Gait Pattern/deviations: Step-to pattern;Step-through pattern;Shuffle;Trunk flexed     General Gait Details: cues for posture, position from RW and initial sequence   Stairs Stairs: Yes Stairs assistance: Min assist Stair Management: One rail Right;Two rails;Step to pattern;Forwards Number of Stairs: 12 General stair comments: cues for sequence and foot placement; 2 steps with bil rails and 10 with crutch and rail  Wheelchair Mobility    Modified Rankin (Stroke Patients Only)       Balance                                    Cognition Arousal/Alertness: Awake/alert Behavior During Therapy: WFL for tasks assessed/performed Overall Cognitive Status: Impaired/Different from  baseline (following commands but not carrying over)                      Exercises Total Joint Exercises Ankle Circles/Pumps: AROM;Both;15 reps;Supine Quad Sets: AROM;Both;10 reps;Supine Heel Slides: AAROM;Right;Supine;20 reps Hip ABduction/ADduction: AAROM;Right;Supine;20 reps    General Comments        Pertinent Vitals/Pain Pain Assessment: 0-10 Pain Score: 8  Pain Location: R hip Pain Descriptors / Indicators: Aching Pain Intervention(s): Limited activity within patient's tolerance;Monitored during session    Home Living Family/patient expects to be discharged to:: Private residence Living Arrangements: Spouse/significant other Available Help at Discharge: Family Type of Home: House Home Access: Stairs to enter Entrance Stairs-Rails: Right;Left;Can reach both Home Layout: Two level Home Equipment: Bedside commode;Shower seat - built in      Prior Function Level of Independence: Independent          PT Goals (current goals can now be found in the care plan section) Acute Rehab PT Goals Patient Stated Goal: Resume previous lifestyle with decreased pain PT Goal Formulation: With patient Time For Goal Achievement: 06/08/14 Potential to Achieve Goals: Good Progress towards PT goals: Progressing toward goals    Frequency  7X/week    PT Plan Current plan remains appropriate    Co-evaluation             End of Session Equipment Utilized During Treatment: Gait belt Activity Tolerance: Patient tolerated treatment well Patient left: in chair;with call bell/phone within reach     Time: 0910-0950 PT Time Calculation (min): 40 min  Charges:  $Gait Training: 8-22 mins $Therapeutic Exercise: 8-22 mins $Therapeutic Activity: 8-22 mins                    G Codes:      [facility] 06/11/2014, 12:51 PM

## 2014-05-26 NOTE — ED Notes (Signed)
Patient was discharged today from the hospital status post hip replacement. Patient is having a lot of nausea with pain medicine. Patient wanted to come back to the hospital due to the nausea and she says she is not being relieved with the pain medicine. Patient stated that the pain is not controlled.

## 2014-05-27 ENCOUNTER — Encounter (HOSPITAL_COMMUNITY): Payer: Self-pay | Admitting: Emergency Medicine

## 2014-05-27 DIAGNOSIS — N898 Other specified noninflammatory disorders of vagina: Secondary | ICD-10-CM | POA: Diagnosis present

## 2014-05-27 DIAGNOSIS — M25551 Pain in right hip: Secondary | ICD-10-CM | POA: Diagnosis present

## 2014-05-27 DIAGNOSIS — M13861 Other specified arthritis, right knee: Secondary | ICD-10-CM | POA: Diagnosis present

## 2014-05-27 DIAGNOSIS — I2510 Atherosclerotic heart disease of native coronary artery without angina pectoris: Secondary | ICD-10-CM | POA: Diagnosis present

## 2014-05-27 DIAGNOSIS — M4690 Unspecified inflammatory spondylopathy, site unspecified: Secondary | ICD-10-CM | POA: Diagnosis present

## 2014-05-27 DIAGNOSIS — A419 Sepsis, unspecified organism: Secondary | ICD-10-CM | POA: Diagnosis present

## 2014-05-27 DIAGNOSIS — M549 Dorsalgia, unspecified: Secondary | ICD-10-CM | POA: Diagnosis present

## 2014-05-27 DIAGNOSIS — J189 Pneumonia, unspecified organism: Secondary | ICD-10-CM | POA: Diagnosis present

## 2014-05-27 DIAGNOSIS — Y95 Nosocomial condition: Secondary | ICD-10-CM | POA: Diagnosis present

## 2014-05-27 DIAGNOSIS — I10 Essential (primary) hypertension: Secondary | ICD-10-CM | POA: Diagnosis present

## 2014-05-27 DIAGNOSIS — Z8249 Family history of ischemic heart disease and other diseases of the circulatory system: Secondary | ICD-10-CM | POA: Diagnosis not present

## 2014-05-27 DIAGNOSIS — Z880 Allergy status to penicillin: Secondary | ICD-10-CM | POA: Diagnosis not present

## 2014-05-27 DIAGNOSIS — D500 Iron deficiency anemia secondary to blood loss (chronic): Secondary | ICD-10-CM | POA: Diagnosis present

## 2014-05-27 DIAGNOSIS — G5602 Carpal tunnel syndrome, left upper limb: Secondary | ICD-10-CM | POA: Diagnosis present

## 2014-05-27 DIAGNOSIS — I252 Old myocardial infarction: Secondary | ICD-10-CM | POA: Diagnosis not present

## 2014-05-27 DIAGNOSIS — E785 Hyperlipidemia, unspecified: Secondary | ICD-10-CM | POA: Diagnosis present

## 2014-05-27 DIAGNOSIS — G5601 Carpal tunnel syndrome, right upper limb: Secondary | ICD-10-CM | POA: Diagnosis present

## 2014-05-27 DIAGNOSIS — G894 Chronic pain syndrome: Secondary | ICD-10-CM | POA: Diagnosis present

## 2014-05-27 DIAGNOSIS — N390 Urinary tract infection, site not specified: Secondary | ICD-10-CM | POA: Diagnosis present

## 2014-05-27 DIAGNOSIS — Z882 Allergy status to sulfonamides status: Secondary | ICD-10-CM | POA: Diagnosis not present

## 2014-05-27 DIAGNOSIS — R5082 Postprocedural fever: Secondary | ICD-10-CM

## 2014-05-27 DIAGNOSIS — Z885 Allergy status to narcotic agent status: Secondary | ICD-10-CM | POA: Diagnosis not present

## 2014-05-27 DIAGNOSIS — K219 Gastro-esophageal reflux disease without esophagitis: Secondary | ICD-10-CM | POA: Diagnosis present

## 2014-05-27 DIAGNOSIS — Z955 Presence of coronary angioplasty implant and graft: Secondary | ICD-10-CM | POA: Diagnosis not present

## 2014-05-27 DIAGNOSIS — F329 Major depressive disorder, single episode, unspecified: Secondary | ICD-10-CM | POA: Diagnosis present

## 2014-05-27 DIAGNOSIS — M13862 Other specified arthritis, left knee: Secondary | ICD-10-CM | POA: Diagnosis present

## 2014-05-27 DIAGNOSIS — F17210 Nicotine dependence, cigarettes, uncomplicated: Secondary | ICD-10-CM | POA: Diagnosis present

## 2014-05-27 DIAGNOSIS — Z9889 Other specified postprocedural states: Secondary | ICD-10-CM | POA: Diagnosis not present

## 2014-05-27 DIAGNOSIS — Z881 Allergy status to other antibiotic agents status: Secondary | ICD-10-CM | POA: Diagnosis not present

## 2014-05-27 DIAGNOSIS — D62 Acute posthemorrhagic anemia: Secondary | ICD-10-CM | POA: Diagnosis present

## 2014-05-27 DIAGNOSIS — Z79899 Other long term (current) drug therapy: Secondary | ICD-10-CM | POA: Diagnosis not present

## 2014-05-27 DIAGNOSIS — Z7901 Long term (current) use of anticoagulants: Secondary | ICD-10-CM | POA: Diagnosis not present

## 2014-05-27 DIAGNOSIS — Z96641 Presence of right artificial hip joint: Secondary | ICD-10-CM | POA: Diagnosis present

## 2014-05-27 LAB — CBC WITH DIFFERENTIAL/PLATELET
BASOS ABS: 0 10*3/uL (ref 0.0–0.1)
Basophils Relative: 0 % (ref 0–1)
Eosinophils Absolute: 0 10*3/uL (ref 0.0–0.7)
Eosinophils Relative: 0 % (ref 0–5)
HEMATOCRIT: 22.1 % — AB (ref 36.0–46.0)
Hemoglobin: 7.6 g/dL — ABNORMAL LOW (ref 12.0–15.0)
LYMPHS PCT: 10 % — AB (ref 12–46)
Lymphs Abs: 0.7 10*3/uL (ref 0.7–4.0)
MCH: 30.5 pg (ref 26.0–34.0)
MCHC: 34.4 g/dL (ref 30.0–36.0)
MCV: 88.8 fL (ref 78.0–100.0)
Monocytes Absolute: 0.6 10*3/uL (ref 0.1–1.0)
Monocytes Relative: 9 % (ref 3–12)
NEUTROS ABS: 6 10*3/uL (ref 1.7–7.7)
Neutrophils Relative %: 81 % — ABNORMAL HIGH (ref 43–77)
PLATELETS: 223 10*3/uL (ref 150–400)
RBC: 2.49 MIL/uL — AB (ref 3.87–5.11)
RDW: 12.7 % (ref 11.5–15.5)
WBC: 7.4 10*3/uL (ref 4.0–10.5)

## 2014-05-27 LAB — URINALYSIS, ROUTINE W REFLEX MICROSCOPIC
Bilirubin Urine: NEGATIVE
Bilirubin Urine: NEGATIVE
GLUCOSE, UA: NEGATIVE mg/dL
Glucose, UA: NEGATIVE mg/dL
Ketones, ur: NEGATIVE mg/dL
Ketones, ur: NEGATIVE mg/dL
LEUKOCYTES UA: NEGATIVE
LEUKOCYTES UA: NEGATIVE
NITRITE: NEGATIVE
Nitrite: NEGATIVE
PH: 5 (ref 5.0–8.0)
PH: 6 (ref 5.0–8.0)
PROTEIN: NEGATIVE mg/dL
Protein, ur: NEGATIVE mg/dL
Specific Gravity, Urine: 1.007 (ref 1.005–1.030)
Specific Gravity, Urine: 1.016 (ref 1.005–1.030)
Urobilinogen, UA: 0.2 mg/dL (ref 0.0–1.0)
Urobilinogen, UA: 0.2 mg/dL (ref 0.0–1.0)

## 2014-05-27 LAB — PREPARE RBC (CROSSMATCH)

## 2014-05-27 LAB — CBC
HCT: 19 % — ABNORMAL LOW (ref 36.0–46.0)
HEMOGLOBIN: 6.7 g/dL — AB (ref 12.0–15.0)
MCH: 31.6 pg (ref 26.0–34.0)
MCHC: 35.3 g/dL (ref 30.0–36.0)
MCV: 89.6 fL (ref 78.0–100.0)
Platelets: 178 10*3/uL (ref 150–400)
RBC: 2.12 MIL/uL — ABNORMAL LOW (ref 3.87–5.11)
RDW: 12.9 % (ref 11.5–15.5)
WBC: 5.3 10*3/uL (ref 4.0–10.5)

## 2014-05-27 LAB — BASIC METABOLIC PANEL
ANION GAP: 11 (ref 5–15)
BUN: 6 mg/dL (ref 6–23)
CHLORIDE: 98 meq/L (ref 96–112)
CO2: 24 meq/L (ref 19–32)
Calcium: 7.7 mg/dL — ABNORMAL LOW (ref 8.4–10.5)
Creatinine, Ser: 0.65 mg/dL (ref 0.50–1.10)
GFR calc Af Amer: >90 mL/min (ref >90)
GFR calc non Af Amer: 89 mL/min — ABNORMAL LOW (ref >90)
Glucose, Bld: 110 mg/dL — ABNORMAL HIGH (ref 70–99)
Potassium: 3.4 mEq/L — ABNORMAL LOW (ref 3.7–5.3)
SODIUM: 133 meq/L — AB (ref 137–147)

## 2014-05-27 LAB — TYPE AND SCREEN
ABO/RH(D): O POS
Antibody Screen: NEGATIVE

## 2014-05-27 LAB — URINE MICROSCOPIC-ADD ON

## 2014-05-27 LAB — LEGIONELLA ANTIGEN, URINE

## 2014-05-27 LAB — STREP PNEUMONIAE URINARY ANTIGEN: STREP PNEUMO URINARY ANTIGEN: NEGATIVE

## 2014-05-27 LAB — GLUCOSE, CAPILLARY: GLUCOSE-CAPILLARY: 123 mg/dL — AB (ref 70–99)

## 2014-05-27 MED ORDER — HYDROMORPHONE HCL 2 MG PO TABS
2.0000 mg | ORAL_TABLET | ORAL | Status: DC | PRN
Start: 2014-05-27 — End: 2014-05-30
  Administered 2014-05-27 – 2014-05-29 (×11): 4 mg via ORAL
  Administered 2014-05-30: 2 mg via ORAL
  Administered 2014-05-30: 4 mg via ORAL
  Filled 2014-05-27 (×5): qty 2
  Filled 2014-05-27 (×2): qty 1
  Filled 2014-05-27 (×4): qty 2
  Filled 2014-05-27: qty 1
  Filled 2014-05-27 (×2): qty 2
  Filled 2014-05-27: qty 1
  Filled 2014-05-27: qty 2

## 2014-05-27 MED ORDER — NITROGLYCERIN 0.4 MG SL SUBL: 0.4000 mg | SUBLINGUAL_TABLET | SUBLINGUAL | Status: DC | PRN

## 2014-05-27 MED ORDER — DEXTROSE 5 % IV SOLN
1.0000 g | INTRAVENOUS | Status: AC
  Administered 2014-05-27: 1 g via INTRAVENOUS
  Filled 2014-05-27: qty 1

## 2014-05-27 MED ORDER — ZOLPIDEM TARTRATE 5 MG PO TABS
5.0000 mg | ORAL_TABLET | Freq: Every evening | ORAL | Status: DC | PRN
  Administered 2014-05-28 – 2014-05-30 (×3): 5 mg via ORAL
  Filled 2014-05-27 (×4): qty 1

## 2014-05-27 MED ORDER — GLYCERIN (LAXATIVE) 2.1 G RE SUPP
1.0000 | Freq: Every day | RECTAL | Status: DC | PRN
  Filled 2014-05-27 (×2): qty 1

## 2014-05-27 MED ORDER — ESCITALOPRAM OXALATE 20 MG PO TABS
20.0000 mg | ORAL_TABLET | Freq: Every morning | ORAL | Status: DC
  Administered 2014-05-27 – 2014-05-30 (×4): 20 mg via ORAL
  Filled 2014-05-27 (×4): qty 1

## 2014-05-27 MED ORDER — SODIUM CHLORIDE 0.9 % IV SOLN: Freq: Once | INTRAVENOUS | Status: DC

## 2014-05-27 MED ORDER — METHOCARBAMOL 500 MG PO TABS
500.0000 mg | ORAL_TABLET | Freq: Four times a day (QID) | ORAL | Status: DC | PRN
  Administered 2014-05-27: 500 mg via ORAL
  Filled 2014-05-27: qty 1

## 2014-05-27 MED ORDER — SODIUM CHLORIDE 0.9 % IV BOLUS (SEPSIS)
1000.0000 mL | Freq: Once | INTRAVENOUS | Status: AC
  Administered 2014-05-27: 1000 mL via INTRAVENOUS

## 2014-05-27 MED ORDER — RIVAROXABAN 10 MG PO TABS
10.0000 mg | ORAL_TABLET | Freq: Every day | ORAL | Status: DC
  Administered 2014-05-27 – 2014-05-30 (×4): 10 mg via ORAL
  Filled 2014-05-27 (×6): qty 1

## 2014-05-27 MED ORDER — ZOLPIDEM TARTRATE 10 MG PO TABS: 10.0000 mg | ORAL_TABLET | Freq: Every evening | ORAL | Status: DC | PRN

## 2014-05-27 MED ORDER — ONDANSETRON HCL 4 MG/2ML IJ SOLN
4.0000 mg | Freq: Once | INTRAMUSCULAR | Status: AC
  Administered 2014-05-27: 4 mg via INTRAVENOUS
  Filled 2014-05-27: qty 2

## 2014-05-27 MED ORDER — TRAMADOL HCL 50 MG PO TABS
50.0000 mg | ORAL_TABLET | Freq: Four times a day (QID) | ORAL | Status: DC | PRN
Start: 2014-05-27 — End: 2014-05-30

## 2014-05-27 MED ORDER — ACETAMINOPHEN 650 MG RE SUPP
650.0000 mg | Freq: Once | RECTAL | Status: AC
  Administered 2014-05-27: 650 mg via RECTAL
  Filled 2014-05-27: qty 1

## 2014-05-27 MED ORDER — ATORVASTATIN CALCIUM 40 MG PO TABS
40.0000 mg | ORAL_TABLET | Freq: Every day | ORAL | Status: DC
  Administered 2014-05-27 – 2014-05-29 (×3): 40 mg via ORAL
  Filled 2014-05-27 (×4): qty 1

## 2014-05-27 MED ORDER — ACETAMINOPHEN 325 MG PO TABS
650.0000 mg | ORAL_TABLET | Freq: Once | ORAL | Status: AC
  Administered 2014-05-27: 650 mg via ORAL
  Filled 2014-05-27: qty 2

## 2014-05-27 MED ORDER — AZTREONAM 2 G IJ SOLR: 2.0000 g | Freq: Three times a day (TID) | INTRAMUSCULAR | Status: DC

## 2014-05-27 MED ORDER — GI COCKTAIL ~~LOC~~
30.0000 mL | Freq: Once | ORAL | Status: AC
  Administered 2014-05-27: 30 mL via ORAL
  Filled 2014-05-27: qty 30

## 2014-05-27 MED ORDER — FLUTICASONE PROPIONATE 50 MCG/ACT NA SUSP
2.0000 | Freq: Every day | NASAL | Status: DC | PRN
  Filled 2014-05-27: qty 16

## 2014-05-27 MED ORDER — SODIUM CHLORIDE 0.9 % IV SOLN
INTRAVENOUS | Status: AC
  Administered 2014-05-27: 04:00:00 via INTRAVENOUS

## 2014-05-27 MED ORDER — VANCOMYCIN HCL IN DEXTROSE 1-5 GM/200ML-% IV SOLN
1000.0000 mg | Freq: Once | INTRAVENOUS | Status: AC
  Administered 2014-05-27: 1000 mg via INTRAVENOUS
  Filled 2014-05-27: qty 200

## 2014-05-27 MED ORDER — SODIUM CHLORIDE 0.9 % IV BOLUS (SEPSIS): 500.0000 mL | Freq: Once | INTRAVENOUS | Status: DC

## 2014-05-27 MED ORDER — INFLUENZA VAC SPLIT QUAD 0.5 ML IM SUSY
0.5000 mL | PREFILLED_SYRINGE | INTRAMUSCULAR | Status: DC
  Filled 2014-05-27 (×2): qty 0.5

## 2014-05-27 MED ORDER — METOPROLOL TARTRATE 25 MG PO TABS
25.0000 mg | ORAL_TABLET | Freq: Two times a day (BID) | ORAL | Status: DC
  Administered 2014-05-27 – 2014-05-30 (×6): 25 mg via ORAL
  Filled 2014-05-27 (×10): qty 1

## 2014-05-27 MED ORDER — PANTOPRAZOLE SODIUM 40 MG PO TBEC
40.0000 mg | DELAYED_RELEASE_TABLET | Freq: Every evening | ORAL | Status: DC
  Administered 2014-05-27 – 2014-05-29 (×3): 40 mg via ORAL
  Filled 2014-05-27 (×4): qty 1

## 2014-05-27 MED ORDER — FERROUS SULFATE 325 (65 FE) MG PO TABS
325.0000 mg | ORAL_TABLET | Freq: Two times a day (BID) | ORAL | Status: DC
  Administered 2014-05-27 – 2014-05-30 (×7): 325 mg via ORAL
  Filled 2014-05-27 (×9): qty 1

## 2014-05-27 MED ORDER — MAGIC MOUTHWASH W/LIDOCAINE
5.0000 mL | Freq: Four times a day (QID) | ORAL | Status: DC | PRN
  Filled 2014-05-27: qty 5

## 2014-05-27 MED ORDER — VANCOMYCIN HCL IN DEXTROSE 1-5 GM/200ML-% IV SOLN
1000.0000 mg | Freq: Two times a day (BID) | INTRAVENOUS | Status: DC
  Administered 2014-05-27 – 2014-05-29 (×4): 1000 mg via INTRAVENOUS
  Filled 2014-05-27 (×4): qty 200

## 2014-05-27 MED ORDER — SENNOSIDES-DOCUSATE SODIUM 8.6-50 MG PO TABS
1.0000 | ORAL_TABLET | Freq: Two times a day (BID) | ORAL | Status: DC
  Administered 2014-05-27 – 2014-05-30 (×7): 1 via ORAL
  Filled 2014-05-27 (×10): qty 1

## 2014-05-27 MED ORDER — ONDANSETRON HCL 4 MG/2ML IJ SOLN
4.0000 mg | Freq: Four times a day (QID) | INTRAMUSCULAR | Status: DC | PRN
  Administered 2014-05-27: 4 mg via INTRAVENOUS
  Filled 2014-05-27: qty 2

## 2014-05-27 MED ORDER — DEXTROSE 5 % IV SOLN
1.0000 g | Freq: Three times a day (TID) | INTRAVENOUS | Status: DC
  Administered 2014-05-27 – 2014-05-29 (×6): 1 g via INTRAVENOUS
  Filled 2014-05-27 (×7): qty 1

## 2014-05-27 NOTE — ED Provider Notes (Signed)
CSN: [id number]     Arrival date & time 05/26/14  2154 History   First MD Initiated Contact with Patient 05/26/14 2351     Chief Complaint  Patient presents with  . Nausea     (Consider location/radiation/quality/duration/timing/severity/associated sxs/prior Treatment) Patient is a 68 y.o. female presenting with fever. The history is provided by the patient.  Fever Temp source:  Oral Severity:  Moderate Onset quality:  Sudden Timing:  Constant Progression:  Unchanged Chronicity:  New Relieved by:  Nothing Worsened by:  Nothing tried Ineffective treatments:  None tried Associated symptoms: cough and nausea   Associated symptoms: no chest pain and no dysuria   Associated symptoms comment:  States only nausea but is coughing up green sputum Risk factors: recent surgery   Hip surgery on 10/7 and was discharged in the afternoon on 10/9.    Past Medical History  Diagnosis Date  . CAD (coronary artery disease)     a. 1997 MI/PCI RCA;  b. 2007 MI/PCI of 100% RCA with Taxus DES x 3 placed, EF 55%;  c. 2010 Cath: stable anatomy;  d. 05/2012 NSTEMI/Cath/PCI: LM nl, LAD 60-76m, D1 20ost, LCX 36m, RCA 40-13m ISR, 60/95d (Treated w/ 2.75x33 Xience Xpedition DES), PDA 48m (Treated w/ PTCA), EF 60%.  . Depression   . Hyperlipidemia   . Carpal tunnel syndrome on both sides   . Numbness of foot left foot    "never recovered from last back OR, 2009" (05/17/2012)  . GERD (gastroesophageal reflux disease)   . Arthritis     "back; probably knees" (05/17/2012)  . Chronic lower back pain   . Anginal pain   . Myocardial infarction  1997, 2007 , last 2013    x 3   Past Surgical History  Procedure Laterality Date  . Cesarean section  1990  . Ankle surgery  years ago    left; "had to take out a floater"  . Posterior fusion lumbar spine  1962; 2009  . Coronary angioplasty with stent placement  1997; 2007, 2013    "2 + 2; + 1= total of 5  . 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 Coronary artery disease Mother   . Diabetes Mother   . Hypertension Mother   . Dementia Father   . Sudden death Brother     suicide  . Cancer Paternal Grandfather     esophageal   History  Substance Use Topics  . Smoking status: Current Every Day Smoker -- 0.50 packs/day for 50 years    Types: Cigarettes  . Smokeless tobacco: Never Used  . Alcohol Use: No   OB History   Grav Para Term Preterm Abortions TAB SAB Ect Mult Living                 Review of Systems  Constitutional: Positive for fever.  Respiratory: Positive for cough.   Cardiovascular: Negative for chest pain.  Gastrointestinal: Positive for nausea and constipation. Negative for abdominal pain.  Genitourinary: Negative for dysuria.  All other systems reviewed and are negative.     Allergies  Oxycodone hcl; Penicillins; Macrobid; and Sulfa antibiotics  Home Medications   Prior to Admission medications   Medication Sig Start Date End Date Taking? Authorizing Provider  Alum & Mag Hydroxide-Simeth (MAGIC MOUTHWASH  W/LIDOCAINE) SOLN Take 5 mLs by mouth 4 (four) times daily as needed for mouth pain. 03/14/14  Yes Timoteo Gaul, FNP  escitalopram (LEXAPRO) 20 MG tablet Take 20 mg by mouth every morning.    Yes Historical Provider, MD  ferrous sulfate (FERROUSUL) 325 (65 FE) MG tablet Take 1 tablet (325 mg total) by mouth 2 (two) times daily with a meal. 05/26/14  Yes Arlee Muslim, PA-C  fluticasone (FLONASE) 50 MCG/ACT nasal spray Place 2 sprays into both nostrils daily as needed for allergies or rhinitis.   Yes Historical Provider, MD  HYDROmorphone (DILAUDID) 2 MG tablet Take 1-2 tablets (2-4 mg total) by mouth every 4 (four) hours as needed for moderate pain or severe pain. 05/26/14  Yes Arlee Muslim, PA-C  methocarbamol  (ROBAXIN) 500 MG tablet Take 1 tablet (500 mg total) by mouth every 6 (six) hours as needed for muscle spasms. 05/26/14  Yes Arlee Muslim, PA-C  metoprolol (LOPRESSOR) 50 MG tablet Take 25 mg by mouth 2 (two) times daily.   Yes Historical Provider, MD  nitroGLYCERIN (NITROSTAT) 0.4 MG SL tablet Place 0.4 mg under the tongue every 5 (five) minutes as needed for chest pain.   Yes Historical Provider, MD  pantoprazole (PROTONIX) 40 MG tablet Take 40 mg by mouth every evening.    Yes Historical Provider, MD  rivaroxaban (XARELTO) 10 MG TABS tablet Take 1 tablet (10 mg total) by mouth daily with breakfast. Take Xarelto for two and a half more weeks, then discontinue Xarelto. Once the patient has completed the Xarelto, they may resume the 81 mg Aspirin and the 75 mg Plavix. 05/26/14  Yes Arlee Muslim, PA-C  simvastatin (ZOCOR) 80 MG tablet Take 80 mg by mouth at bedtime.   Yes Historical Provider, MD  traMADol (ULTRAM) 50 MG tablet Take 1-2 tablets (50-100 mg total) by mouth every 6 (six) hours as needed (mild pain). 05/26/14  Yes Arlee Muslim, PA-C  zolpidem (AMBIEN) 10 MG tablet Take 10 mg by mouth at bedtime as needed for sleep.   Yes Historical Provider, MD   BP 104/46  Pulse 92  Temp(Src) 103 F (39.4 C) (Oral)  Resp 18  Ht 5' 2.6" (1.59 m)  Wt 158 lb 1.1 oz (71.7 kg)  BMI 28.36 kg/m2  SpO2 95% Physical Exam  Constitutional: She is oriented to person, place, and time. She appears well-developed and well-nourished.  HENT:  Head: Normocephalic and atraumatic.  Tacky mucus membranes cracking of the lips  Eyes: Conjunctivae and EOM are normal. Pupils are equal, round, and reactive to light.  Neck: Normal range of motion. Neck supple.  Cardiovascular: Normal rate, regular rhythm and intact distal pulses.   Pulmonary/Chest: Effort normal and breath sounds normal. No stridor. She has no wheezes. She has no rales.  Abdominal: Soft. Bowel sounds are normal. There is no tenderness. There is no rebound  and no guarding.  Musculoskeletal: Normal range of motion.  Neurological: She is alert and oriented to person, place, and time.  Skin: Skin is warm and dry. She is not diaphoretic.  Psychiatric: She has a normal mood and affect.    ED Course  Procedures (including critical care time) Labs Review Labs Reviewed  CBC WITH DIFFERENTIAL - Abnormal; Notable for the following:    RBC 2.49 (*)    Hemoglobin 7.6 (*)    HCT 22.1 (*)    Neutrophils Relative % 81 (*)    Lymphocytes Relative 10 (*)    All other components within normal  limits  URINALYSIS, ROUTINE W REFLEX MICROSCOPIC - Abnormal; Notable for the following:    Hgb urine dipstick MODERATE (*)    All other components within normal limits  CULTURE, BLOOD (ROUTINE X 2)  CULTURE, BLOOD (ROUTINE X 2)  CULTURE, EXPECTORATED SPUTUM-ASSESSMENT  URINE MICROSCOPIC-ADD ON  I-STAT CHEM 8, ED  I-STAT CG4 LACTIC ACID, ED    Imaging Review Dg Chest 2 View  05/27/2014   CLINICAL DATA:  Persistent nausea and right hip pain, status post recent hip replacement. Initial encounter.  EXAM: CHEST  2 VIEW  COMPARISON:  Chest radiograph from 12/19/2013  FINDINGS: The lungs are hypoexpanded but appear grossly clear. There is no evidence of focal opacification, pleural effusion or pneumothorax.  The heart is normal in size; the mediastinal contour is within normal limits. No acute osseous abnormalities are seen.  IMPRESSION: Lungs hypoexpanded but grossly clear.   Electronically Signed   By: Garald Balding M.D.   On: 05/27/2014 00:32     EKG Interpretation None      MDM   Final diagnoses:  Postoperative fever  UTI (lower urinary tract infection)   132 case d/w Dr. Gladstone Lighter of ortho please admit to medicine, info taken will consult.   159 case d/w Dr. Doyle Askew admit to tele team 8   Medications  ceFEPIme (MAXIPIME) 1 g in dextrose 5 % 50 mL IVPB (not administered)  escitalopram (LEXAPRO) tablet 20 mg (not administered)  pantoprazole (PROTONIX)  EC tablet 40 mg (not administered)  metoprolol tartrate (LOPRESSOR) tablet 25 mg (25 mg Oral Not Given 05/27/14 0343)  magic mouthwash w/lidocaine (not administered)  atorvastatin (LIPITOR) tablet 40 mg (not administered)  nitroGLYCERIN (NITROSTAT) SL tablet 0.4 mg (not administered)  fluticasone (FLONASE) 50 MCG/ACT nasal spray 2 spray (not administered)  HYDROmorphone (DILAUDID) tablet 2-4 mg (not administered)  methocarbamol (ROBAXIN) tablet 500 mg (not administered)  rivaroxaban (XARELTO) tablet 10 mg (not administered)  traMADol (ULTRAM) tablet 50-100 mg (not administered)  ferrous sulfate tablet 325 mg (not administered)  ondansetron (ZOFRAN) injection 4 mg (not administered)  0.9 %  sodium chloride infusion ( Intravenous New Bag/Given 05/27/14 0402)  0.9 %  sodium chloride infusion (not administered)  Influenza vac split quadrivalent PF (FLUARIX) injection 0.5 mL (not administered)  zolpidem (AMBIEN) tablet 5 mg (not administered)  vancomycin (VANCOCIN) IVPB 1000 mg/200 mL premix (not administered)  ondansetron (ZOFRAN) injection 4 mg (4 mg Intravenous Given 05/27/14 0023)  sodium chloride 0.9 % bolus 1,000 mL (0 mLs Intravenous Stopped 05/27/14 0149)  vancomycin (VANCOCIN) IVPB 1000 mg/200 mL premix (1,000 mg Intravenous New Bag/Given 05/27/14 0150)  sodium chloride 0.9 % bolus 1,000 mL (1,000 mLs Intravenous New Bag/Given 05/27/14 0219)  ceFEPIme (MAXIPIME) 1 g in dextrose 5 % 50 mL IVPB (0 g Intravenous Stopped 05/27/14 0219)  ondansetron (ZOFRAN) injection 4 mg (4 mg Intravenous Given 05/27/14 0151)  acetaminophen (TYLENOL) suppository 650 mg (650 mg Rectal Given 05/27/14 0219)  gi cocktail (Maalox,Lidocaine,Donnatal) (30 mLs Oral Given 05/27/14 0426)   Results for orders placed during the hospital encounter of 05/26/14  CBC WITH DIFFERENTIAL      Result Value Ref Range   WBC 7.4  4.0 - 10.5 K/uL   RBC 2.49 (*) 3.87 - 5.11 MIL/uL   Hemoglobin 7.6 (*) 12.0 - 15.0 g/dL   HCT  22.1 (*) 36.0 - 46.0 %   MCV 88.8  78.0 - 100.0 fL   MCH 30.5  26.0 - 34.0 pg   MCHC 34.4  30.0 -  36.0 g/dL   RDW 12.7  11.5 - 15.5 %   Platelets 223  150 - 400 K/uL   Neutrophils Relative % 81 (*) 43 - 77 %   Neutro Abs 6.0  1.7 - 7.7 K/uL   Lymphocytes Relative 10 (*) 12 - 46 %   Lymphs Abs 0.7  0.7 - 4.0 K/uL   Monocytes Relative 9  3 - 12 %   Monocytes Absolute 0.6  0.1 - 1.0 K/uL   Eosinophils Relative 0  0 - 5 %   Eosinophils Absolute 0.0  0.0 - 0.7 K/uL   Basophils Relative 0  0 - 1 %   Basophils Absolute 0.0  0.0 - 0.1 K/uL  URINALYSIS, ROUTINE W REFLEX MICROSCOPIC      Result Value Ref Range   Color, Urine YELLOW  YELLOW   APPearance CLEAR  CLEAR   Specific Gravity, Urine 1.016  1.005 - 1.030   pH 5.0  5.0 - 8.0   Glucose, UA NEGATIVE  NEGATIVE mg/dL   Hgb urine dipstick MODERATE (*) NEGATIVE   Bilirubin Urine NEGATIVE  NEGATIVE   Ketones, ur NEGATIVE  NEGATIVE mg/dL   Protein, ur NEGATIVE  NEGATIVE mg/dL   Urobilinogen, UA 0.2  0.0 - 1.0 mg/dL   Nitrite NEGATIVE  NEGATIVE   Leukocytes, UA NEGATIVE  NEGATIVE  URINE MICROSCOPIC-ADD ON      Result Value Ref Range   Squamous Epithelial / LPF RARE  RARE   WBC, UA 0-2  <3 WBC/hpf   RBC / HPF 7-10  <3 RBC/hpf   Bacteria, UA RARE  RARE  BASIC METABOLIC PANEL      Result Value Ref Range   Sodium 133 (*) 137 - 147 mEq/L   Potassium 3.4 (*) 3.7 - 5.3 mEq/L   Chloride 98  96 - 112 mEq/L   CO2 24  19 - 32 mEq/L   Glucose, Bld 110 (*) 70 - 99 mg/dL   BUN 6  6 - 23 mg/dL   Creatinine, Ser 0.65  0.50 - 1.10 mg/dL   Calcium 7.7 (*) 8.4 - 10.5 mg/dL   GFR calc non Af Amer 89 (*) >90 mL/min   GFR calc Af Amer >90  >90 mL/min   Anion gap 11  5 - 15  CBC      Result Value Ref Range   WBC 5.3  4.0 - 10.5 K/uL   RBC 2.12 (*) 3.87 - 5.11 MIL/uL   Hemoglobin 6.7 (*) 12.0 - 15.0 g/dL   HCT 19.0 (*) 36.0 - 46.0 %   MCV 89.6  78.0 - 100.0 fL   MCH 31.6  26.0 - 34.0 pg   MCHC 35.3  30.0 - 36.0 g/dL   RDW 12.9  11.5 - 15.5 %    Platelets 178  150 - 400 K/uL  GLUCOSE, CAPILLARY      Result Value Ref Range   Glucose-Capillary 123 (*) 70 - 99 mg/dL   Dg Chest 2 View  05/27/2014   CLINICAL DATA:  Persistent nausea and right hip pain, status post recent hip replacement. Initial encounter.  EXAM: CHEST  2 VIEW  COMPARISON:  Chest radiograph from 12/19/2013  FINDINGS: The lungs are hypoexpanded but appear grossly clear. There is no evidence of focal opacification, pleural effusion or pneumothorax.  The heart is normal in size; the mediastinal contour is within normal limits. No acute osseous abnormalities are seen.  IMPRESSION: Lungs hypoexpanded but grossly clear.   Electronically Signed   By:  Garald Balding M.D.   On: 05/27/2014 00:32   Dg Hip Complete Right  05/19/2014   CLINICAL DATA:  Preoperative evaluation for right hip replacement, osteoarthritis of the hip  EXAM: RIGHT HIP - COMPLETE 2+ VIEW  COMPARISON:  None.  FINDINGS: Degenerative changes of the right hip joint are noted with remodeling of the femoral head. Subchondral sclerosis and cyst formation is noted in both the femoral head and superior acetabulum. No acute fracture is seen. Overhanging osteophytes are noted. Lesser osteoarthritic changes are noted in the left hip. The pelvic ring is intact. Degenerative change of the lumbar spine is noted.  IMPRESSION: Osteoarthritic changes right greater than left within the hip joints. No acute abnormality is noted.   Electronically Signed   By: Inez Catalina M.D.   On: 05/19/2014 15:34   Dg Pelvis Portable  05/24/2014   CLINICAL DATA:  Status post total hip arthroplasty  EXAM: DG C-ARM 1-60 MIN - NRPT [facility]; PORTABLE PELVIS 1-2 VIEWS  COMPARISON:  None.  FINDINGS: Frontal view shows total hip replacement on the right. Prosthetic components appear well seated. No fracture or dislocation. There is a surgical drain in the lateral right hip joint region. There is moderate narrowing of the left hip joint.  IMPRESSION: Total hip  arthroplasty components appear well seated on the right. No fracture or dislocation. Moderate narrowing left hip joint.   Electronically Signed   By: Lowella Grip M.D.   On: 05/24/2014 15:22   Dg C-arm 1-60 Min-no Report  05/24/2014   CLINICAL DATA:  Status post total hip arthroplasty  EXAM: DG C-ARM 1-60 MIN - NRPT [facility]; PORTABLE PELVIS 1-2 VIEWS  COMPARISON:  None.  FINDINGS: Frontal view shows total hip replacement on the right. Prosthetic components appear well seated. No fracture or dislocation. There is a surgical drain in the lateral right hip joint region. There is moderate narrowing of the left hip joint.  IMPRESSION: Total hip arthroplasty components appear well seated on the right. No fracture or dislocation. Moderate narrowing left hip joint.   Electronically Signed   By: Lowella Grip M.D.   On: 05/24/2014 15:22   Robinette Cm  05/10/2014   CLINICAL DATA:  Persistent sinus pressure in the frontal and maxillary sinuses for 3 weeks. Nasal congestion.  EXAM: CT PARANASAL SINUS WITHOUT CONTRAST  TECHNIQUE: Multidetector CT images of the paranasal sinuses were obtained using the standard protocol without intravenous contrast.  COMPARISON:  CT scan dated 01/13/2008  FINDINGS: There is complete opacification of the right maxillary sinus as well as partial opacification of the anterior ethmoid air cells and complete opacification of the right side of the frontal sinus. In addition, there is periapical lucency around the roots of tooth number 3.  There is a 2.7 mm osteoma in the midline of the frontal sinus, not significant. The other paranasal sinuses are clear. Mastoid air cells and middle ear cavities are clear.  IMPRESSION: Periapical abscess involving the roots of tooth 3. This appears to extend into the base of the right maxillary sinus. Adjacent opacification of right frontal sinus and right ethmoid sinus anterior ethmoid sinuses may be secondary to the inflammation in the  right maxillary sinus. I suspect all of the findings may be secondary to the periapical abscess.   Electronically Signed   By: Rozetta Nunnery M.D.   On: 05/10/2014 10:28        Kaeya Schiffer K Lopaka Karge-Rasch, MD 05/27/14 [phone]

## 2014-05-27 NOTE — Progress Notes (Signed)
CRITICAL VALUE ALERT  Critical value received:  Hgb 6.7  Date of notification:  05/27/2014  Time of notification:  0700  Critical value read back:Yes.    Nurse who received alert:  J.Chanze Teagle  MD notified (1st page):  N/A  Time of first page:  N/A  MD notified (2nd page):  Time of second page:  Responding MD:  N/A  Time MD responded:  N/A  Reported to Day shift Nurse

## 2014-05-27 NOTE — Progress Notes (Addendum)
ANTIBIOTIC CONSULT NOTE - INITIAL  Pharmacy Consult for Cefepime Indication: Empiric treatment - fever  Allergies  Allergen Reactions  . Oxycodone Hcl Itching and Other (See Comments)    confusion  . Penicillins Rash  . Macrobid [Nitrofurantoin Monohyd Macro] Nausea And Vomiting  . Sulfa Antibiotics Nausea Only    Patient Measurements: Height: 5' 2.6" (159 cm) Weight: 158 lb 1.1 oz (71.7 kg) IBW/kg (Calculated) : 51.48  Vital Signs: Temp: 103 F (39.4 C) (10/10 0029) Temp Source: Oral (10/10 0029) BP: 104/46 mmHg (10/10 0029) Pulse Rate: 92 (10/10 0029) Intake/Output from previous day:   Intake/Output from this shift:    Labs:  Recent Labs  05/25/14 0500 05/26/14 0430 05/26/14 2347  WBC 9.5 11.7* 7.4  HGB 8.6* 7.9* 7.6*  PLT 217 241 223  CREATININE 0.57 0.61  --    Estimated Creatinine Clearance: 63.3 ml/min (by C-G formula based on Cr of 0.61). No results found for this basename: VANCOTROUGH, Corlis Leak, VANCORANDOM, GENTTROUGH, GENTPEAK, GENTRANDOM, TOBRATROUGH, TOBRAPEAK, TOBRARND, AMIKACINPEAK, AMIKACINTROU, AMIKACIN,  in the last 72 hours   Microbiology: Recent Results (from the past 720 hour(s))  SURGICAL PCR SCREEN     Status: None   Collection Time    05/19/14  2:43 PM      Result Value Ref Range Status   MRSA, PCR NEGATIVE  NEGATIVE Final   Staphylococcus aureus NEGATIVE  NEGATIVE Final   Comment:            The Xpert SA Assay (FDA     approved for NASAL specimens     in patients over 22 years of age),     is one component of     a comprehensive surveillance     program.  Test performance has     been validated by [employer] for patients greater     than or equal to 70 year old.     It is not intended     to diagnose infection nor to     guide or monitor treatment.    Medical History: Past Medical History  Diagnosis Date  . CAD (coronary artery disease)     a. 1997 MI/PCI RCA;  b. 2007 MI/PCI of 100% RCA with Taxus DES x 3 placed,  EF 55%;  c. 2010 Cath: stable anatomy;  d. 05/2012 NSTEMI/Cath/PCI: LM nl, LAD 60-80m, D1 20ost, LCX 49m, RCA 40-35m ISR, 60/95d (Treated w/ 2.75x33 Xience Xpedition DES), PDA 20m (Treated w/ PTCA), EF 60%.  . Depression   . Hyperlipidemia   . Carpal tunnel syndrome on both sides   . Numbness of foot left foot    "never recovered from last back OR, 2009" (05/17/2012)  . GERD (gastroesophageal reflux disease)   . Arthritis     "back; probably knees" (05/17/2012)  . Chronic lower back pain   . Anginal pain   . Myocardial infarction  1997, 2007 , last 2013    x 3    Medications:  Scheduled:  . ondansetron (ZOFRAN) IV  4 mg Intravenous Once   Infusions:  . ceFEPime (MAXIPIME) IV    . sodium chloride    . vancomycin     Assessment:  68 yr female discharged on 10/9 s/p THR on 10/7.  Returned to ED later that night with complaint of nausea  Patient found to be febrile (Temp = 103F)  Blood cultures x 2, sputum culture ordered  Vancomycin 1gm IV x 1 ordered in ED  MD  has requested pharmacy to dose Cefepime for empiric treatment of fever of unknown origin (no H&P for this admission available at this time)  Goal of Therapy:   Eradication of suspected infection  Plan:   Cefepime 1gm IV q8h  F/u Cultures and sensitivities  F/U continuation of Vancomycin  Jessel Gettinger, Toribio Harbour, PharmD 05/27/2014,1:15 AM  ADDENDUM:  Patient being admitted and Pharmacy consulted to dose Vancomycin for suspected pneumonia  Vancomycin Trough goal = 15-20 mcg/ml  Plan:  Vancomycin 1gm IV q12h  Leone Haven, PharmD 05/27/14 @ 03:53

## 2014-05-27 NOTE — H&P (Signed)
[facility] History and Physical  Bianca [name] BHA:[id number] DOB: 1946/06/19 DOA: 05/26/2014  Referring physician: ED physician PCP: Donia Ast, FNP   Chief Complaint: nausea and cough  HPI:  Pt is 68 yo female just discharged from the hospital 10/09 after having right hip arthroplasty, presented back to [facility] ED with main concern of persistent pain in the right hip area with nausea and poor oral intake. Pt explains she has also noted cough productive of green sputum, felt hot with fever and though she needed to come back. She denies chest pain but explains has some exertional dyspnea. She denies other abd concerns.   In ED, pt noted to be hemodynamically stable, VSS. CXR with no sign of PNA. Hg down to 7.6. Pt given one dose of Maxipime and Vanc for presumptive PNA. TRH asked to admit. Ortho team that discharged pt consulted by Dr. Randal Buba.    Assessment and Plan: Active Problems: Fever with cough, with nausea  - no clear infectious etiology on CXR, UA unremarkable - will continue Vanc and Maxipime for treatment of presumptive HCAP - sputum analysis requested as well urine legionella and strep pnemuo - will d/c ABX if no signs of an infectious etiology  - provide antiemetics as needed  S/P right hip arthroplasty - provide analgesia as needed - PT evaluation - resume Xarelto per Ortho rec's - may need to hold if further drop in Hg noted  Acute on chronic blood loss anemia - post op related - provide one unit of PRBC - repeat CBC in AM HTN - continue Metoprolol as per home medical regimen   Radiological Exams on Admission: Dg Chest 2 View  05/27/2014   CLINICAL DATA:  Persistent nausea and right hip pain, status post recent hip replacement. Initial encounter.  EXAM: CHEST  2 VIEW  COMPARISON:  Chest radiograph from 12/19/2013  FINDINGS: The lungs are hypoexpanded but appear grossly clear. There is no evidence of focal opacification, pleural effusion or  pneumothorax.  The heart is normal in size; the mediastinal contour is within normal limits. No acute osseous abnormalities are seen.  IMPRESSION: Lungs hypoexpanded but grossly clear.   Electronically Signed   By: Garald Balding M.D.   On: 05/27/2014 00:32     Code Status: Full Family Communication: Pt at bedside Disposition Plan: Admit for further evaluation     Review of Systems:  Constitutional: Negative for diaphoresis.  HENT: Negative for hearing loss, ear pain, nosebleeds, congestion, sore throat, neck pain, tinnitus and ear discharge.   Eyes: Negative for blurred vision, double vision, photophobia, pain, discharge and redness.  Respiratory: Negative for wheezing and stridor.   Cardiovascular: Negative for chest pain, palpitations, orthopnea, claudication and leg swelling.  Gastrointestinal: Negative for vomiting and abdominal pain. Genitourinary: Negative for dysuria, urgency, frequency, hematuria and flank pain.  Musculoskeletal: Negative for myalgias, back pain, joint pain and falls.  Skin: Negative for itching and rash.  Neurological: Negative for dizziness and weakness. Endo/Heme/Allergies: Negative for environmental allergies and polydipsia. Does not bruise/bleed easily.  Psychiatric/Behavioral: Negative for suicidal ideas. The patient is not nervous/anxious.      Past Medical History  Diagnosis Date  . CAD (coronary artery disease)     a. 1997 MI/PCI RCA;  b. 2007 MI/PCI of 100% RCA with Taxus DES x 3 placed, EF 55%;  c. 2010 Cath: stable anatomy;  d. 05/2012 NSTEMI/Cath/PCI: LM nl, LAD 60-52m, D1 20ost, LCX 73m, RCA 40-96m ISR, 60/95d (Treated w/ 2.75x33 Xience Xpedition DES),  PDA 49m (Treated w/ PTCA), EF 60%.  . Depression   . Hyperlipidemia   . Carpal tunnel syndrome on both sides   . Numbness of foot left foot    "never recovered from last back OR, 2009" (05/17/2012)  . GERD (gastroesophageal reflux disease)   . Arthritis     "back; probably knees" (05/17/2012)  .  Chronic lower back pain   . Anginal pain   . Myocardial infarction  1997, 2007 , last 2013    x 3    Past Surgical History  Procedure Laterality Date  . Cesarean section  1990  . Ankle surgery  years ago    left; "had to take out a floater"  . Posterior fusion lumbar spine  1962; 2009  . Coronary angioplasty with stent placement  1997; 2007, 2013    "2 + 2; + 1= total of 5  . Total hip arthroplasty Right 05/24/2014    Procedure: RIGHT TOTAL HIP ARTHROPLASTY ANTERIOR APPROACH;  Surgeon: Gearlean Alf, MD;  Location: [facility];  Service: Orthopedics;  Laterality: Right;    Social History:  reports that she has been smoking Cigarettes.  She has a 25 pack-year smoking history. She has never used smokeless tobacco. She reports that she does not drink alcohol or use illicit drugs.  Allergies  Allergen Reactions  . Oxycodone Hcl Itching and Other (See Comments)    confusion  . Penicillins Rash  . Macrobid [Nitrofurantoin Monohyd Macro] Nausea And Vomiting  . Sulfa Antibiotics Nausea Only    Family History  Problem Relation Age of Onset  . Coronary artery disease Mother   . Diabetes Mother   . Hypertension Mother   . Dementia Father   . Sudden death Brother     suicide  . Cancer Paternal Grandfather     esophageal    Prior to Admission medications   Medication Sig Start Date End Date Taking? Authorizing Provider  Alum & Mag Hydroxide-Simeth (MAGIC MOUTHWASH W/LIDOCAINE) SOLN Take 5 mLs by mouth 4 (four) times daily as needed for mouth pain. 03/14/14  Yes Timoteo Gaul, FNP  escitalopram (LEXAPRO) 20 MG tablet Take 20 mg by mouth every morning.    Yes Historical Provider, MD  ferrous sulfate (FERROUSUL) 325 (65 FE) MG tablet Take 1 tablet (325 mg total) by mouth 2 (two) times daily with a meal. 05/26/14  Yes Arlee Muslim, PA-C  fluticasone (FLONASE) 50 MCG/ACT nasal spray Place 2 sprays into both nostrils daily as needed for allergies or rhinitis.   Yes Historical Provider, MD   HYDROmorphone (DILAUDID) 2 MG tablet Take 1-2 tablets (2-4 mg total) by mouth every 4 (four) hours as needed for moderate pain or severe pain. 05/26/14  Yes Arlee Muslim, PA-C  methocarbamol (ROBAXIN) 500 MG tablet Take 1 tablet (500 mg total) by mouth every 6 (six) hours as needed for muscle spasms. 05/26/14  Yes Arlee Muslim, PA-C  metoprolol (LOPRESSOR) 50 MG tablet Take 25 mg by mouth 2 (two) times daily.   Yes Historical Provider, MD  nitroGLYCERIN (NITROSTAT) 0.4 MG SL tablet Place 0.4 mg under the tongue every 5 (five) minutes as needed for chest pain.   Yes Historical Provider, MD  pantoprazole (PROTONIX) 40 MG tablet Take 40 mg by mouth every evening.    Yes Historical Provider, MD  rivaroxaban (XARELTO) 10 MG TABS tablet Take 1 tablet (10 mg total) by mouth daily with breakfast. Take Xarelto for two and a half more weeks, then discontinue Xarelto. Once  the patient has completed the Xarelto, they may resume the 81 mg Aspirin and the 75 mg Plavix. 05/26/14  Yes Arlee Muslim, PA-C  simvastatin (ZOCOR) 80 MG tablet Take 80 mg by mouth at bedtime.   Yes Historical Provider, MD  traMADol (ULTRAM) 50 MG tablet Take 1-2 tablets (50-100 mg total) by mouth every 6 (six) hours as needed (mild pain). 05/26/14  Yes Arlee Muslim, PA-C  zolpidem (AMBIEN) 10 MG tablet Take 10 mg by mouth at bedtime as needed for sleep.   Yes Historical Provider, MD    Physical Exam: Filed Vitals:   05/27/14 0100 05/27/14 0110 05/27/14 0130 05/27/14 0229  BP: 101/47  122/57 115/50  Pulse: 90     Temp:    100.9 F (38.3 C)  TempSrc:    Oral  Resp:    19  Height:  5' 2.6" (1.59 m)    Weight:  71.7 kg (158 lb 1.1 oz)    SpO2: 91%   95%    Physical Exam  Constitutional: Appears well-developed and well-nourished. No distress.  HENT: Normocephalic. External right and left ear normal. Dry MM Eyes: Conjunctivae and EOM are normal. PERRLA, no scleral icterus.  Neck: Normal ROM. Neck supple. No JVD. No tracheal deviation.  No thyromegaly.  CVS: RRR, S1/S2 +, no gallops, no carotid bruit.  Pulmonary: Effort and breath sounds normal, no stridor, rhonchi, wheezes, rales.  Abdominal: Soft. BS +,  no distension, tenderness, rebound or guarding.  Musculoskeletal: Normal range of motion. TTP in the right hip area   Lymphadenopathy: No lymphadenopathy noted, cervical, inguinal. Neuro: Alert. Normal reflexes, muscle tone coordination. No cranial nerve deficit. Skin: Skin is warm and dry. No rash noted. Not diaphoretic. No erythema. No pallor.  Psychiatric: Normal mood and affect. Behavior, judgment, thought content normal.   Labs on Admission:  Basic Metabolic Panel:  Recent Labs Lab 05/25/14 0500 05/26/14 0430  NA 127* 137  K 4.5 4.2  CL 94* 101  CO2 25 26  GLUCOSE 130* 129*  BUN 4* 6  CREATININE 0.57 0.61  CALCIUM 8.6 8.7   CBC:  Recent Labs Lab 05/25/14 0500 05/26/14 0430 05/26/14 2347  WBC 9.5 11.7* 7.4  NEUTROABS  --   --  6.0  HGB 8.6* 7.9* 7.6*  HCT 24.5* 22.6* 22.1*  MCV 86.9 88.6 88.8  PLT 217 241 223   EKG: Normal sinus rhythm, no ST/T wave changes  Faye Ramsay, MD  [facility] Pager [phone]  If 7PM-7AM, please contact night-coverage [email or web address] Password [email or web address] 05/27/2014, 3:30 AM

## 2014-05-27 NOTE — Progress Notes (Signed)
PROGRESS NOTE  Bianca [name] WGY:[id number] DOB: May 08, 1946 DOA: 05/26/2014 PCP: Donia Ast, FNP  HPI: Pt is 68 yo female just discharged from the hospital 10/09 after having right hip arthroplasty, presented back to [facility] ED with main concern of persistent pain in the right hip area with nausea and poor oral intake. Found to be febrile in the ED and with cough/sputum production  Subjective/ 24 H Interval events - admitted overnight, feeling better this morning, still with hip pain  Assessment/Plan: Sepsis from presumed HCAP - no clear infectious etiology on CXR, UA unremarkable however she endorses cough with sputum production, febrile to 103 in the ED and tachycardic - will continue Vanc and Maxipime for treatment of presumptive HCAP  - sputum analysis requested as well urine legionella and strep pnemuo  - provide antiemetics as needed  S/P right hip arthroplasty  - provide analgesia as needed  - PT evaluation  - resume Xarelto per Ortho rec's  - may need to hold if further drop in Hg noted  Acute on chronic blood loss anemia  - post op related  - provide one unit of PRBC  - repeat CBC in AM  HTN - continue Metoprolol as per home medical regimen   Diet: regular Fluids: NS DVT Prophylaxis: Xarelto  Code Status: Full Family Communication: d/w patient  Disposition Plan: inpatient  Consultants:  Orthopedic surgery   Procedures:  None    Antibiotics Vancomycin 10/10 >> Cefepime 10/10 >>  Studies  Filed Vitals:   05/27/14 0110 05/27/14 0130 05/27/14 0229 05/27/14 0554  BP:  122/57 115/50 107/50  Pulse:    83  Temp:   100.9 F (38.3 C) 99 F (37.2 C)  TempSrc:   Oral Oral  Resp:   19 17  Height: 5' 2.6" (1.59 m)     Weight: 71.7 kg (158 lb 1.1 oz)     SpO2:   95% 92%    Intake/Output Summary (Last 24 hours) at 05/27/14 1201 Last data filed at 05/27/14 7793  Gross per 24 hour  Intake  237.5 ml  Output   1700 ml  Net -1462.5 ml   Filed  Weights   05/27/14 0110  Weight: 71.7 kg (158 lb 1.1 oz)    Exam:  General:  NAD  Cardiovascular: RRR  Respiratory: CTA biL  Abdomen: soft, non tender  MSK: no edema  Data Reviewed: Basic Metabolic Panel:  Recent Labs Lab 05/25/14 0500 05/26/14 0430 05/27/14 0631  NA 127* 137 133*  K 4.5 4.2 3.4*  CL 94* 101 98  CO2 25 26 24   GLUCOSE 130* 129* 110*  BUN 4* 6 6  CREATININE 0.57 0.61 0.65  CALCIUM 8.6 8.7 7.7*   CBC:  Recent Labs Lab 05/25/14 0500 05/26/14 0430 05/26/14 2347 05/27/14 0631  WBC 9.5 11.7* 7.4 5.3  NEUTROABS  --   --  6.0  --   HGB 8.6* 7.9* 7.6* 6.7*  HCT 24.5* 22.6* 22.1* 19.0*  MCV 86.9 88.6 88.8 89.6  PLT 217 241 223 178   Cardiac Enzymes: No results found for this basename: CKTOTAL, CKMB, CKMBINDEX, TROPONINI,  in the last 168 hours BNP (last 3 results)  Recent Labs  12/19/13 0743  PROBNP 353.3*   CBG:  Recent Labs Lab 05/27/14 0400  GLUCAP 123*    Recent Results (from the past 240 hour(s))  SURGICAL PCR SCREEN     Status: None   Collection Time    05/19/14  2:43 PM  Result Value Ref Range Status   MRSA, PCR NEGATIVE  NEGATIVE Final   Staphylococcus aureus NEGATIVE  NEGATIVE Final   Comment:            The Xpert SA Assay (FDA     approved for NASAL specimens     in patients over 42 years of age),     is one component of     a comprehensive surveillance     program.  Test performance has     been validated by [employer] for patients greater     than or equal to 73 year old.     It is not intended     to diagnose infection nor to     guide or monitor treatment.     Studies: No results found.  No results found.  Scheduled Meds: . sodium chloride   Intravenous Once  . atorvastatin  40 mg Oral q1800  . ceFEPime (MAXIPIME) IV  1 g Intravenous Q8H  . escitalopram  20 mg Oral q morning - 10a  . ferrous sulfate  325 mg Oral BID PC  . [START ON 05/28/2014] Influenza vac split quadrivalent PF  0.5 mL  Intramuscular Tomorrow-1000  . metoprolol  25 mg Oral BID  . pantoprazole  40 mg Oral QPM  . rivaroxaban  10 mg Oral Q breakfast  . senna-docusate  1 tablet Oral BID  . vancomycin  1,000 mg Intravenous Q12H   Continuous Infusions:   Active Problems:   UTI (lower urinary tract infection)   HCAP (healthcare-associated pneumonia)   Sepsis  Time spent: [place], MD [facility] Pager [phone]. If 7 PM - 7 AM, please contact night-coverage at [email or web address], password [facility] 05/27/2014, 12:01 PM  LOS: 1 day

## 2014-05-27 NOTE — ED Notes (Signed)
Patient is aware that a sputum culture is needed

## 2014-05-27 NOTE — Progress Notes (Signed)
   Subjective:     Pt recently discharged s/p anterior right total hip but had to return due to anemia  C/o mild to moderate pain in the hip along with feeling of malaise this morning Denies any numbness or tingling to right lower extremity  Patient reports pain as moderate.  Objective:   VITALS:   Filed Vitals:   05/27/14 0554  BP: 107/50  Pulse: 83  Temp: 99 F (37.2 C)  Resp: 17    Right hip incision healing well  No drainage or erythema  LABS  Recent Labs  05/26/14 0430 05/26/14 2347 05/27/14 0631  HGB 7.9* 7.6* 6.7*  HCT 22.6* 22.1* 19.0*  WBC 11.7* 7.4 5.3  PLT 241 223 178     Recent Labs  05/25/14 0500 05/26/14 0430 05/27/14 0631  NA 127* 137 133*  K 4.5 4.2 3.4*  BUN 4* 6 6  CREATININE 0.57 0.61 0.65  GLUCOSE 130* 129* 110*     Assessment/Plan:     currently HBG 6.7 - plan for transfusion of 2 units today and watch her progress Medical team working up other etiologies of fever since UA and CXR were unremarkable Will continue to monitor her progress      [place], PA-C  05/27/2014, 7:59 AM

## 2014-05-27 NOTE — Evaluation (Signed)
Physical Therapy Evaluation Patient Details Name: Bianca [name] MRN: [id number] DOB: 02-May-1946 Today's Date: 05/27/2014   History of Present Illness  Pt s/p ant direct R THR 05/24/14.  Readmitted with UTI  and Hgb 6.7  Clinical Impression  Pt presents with decreased R LE strength/ROM and dizziness with mobility limiting functional mobility.  Pt should progress to d/c home with family assist and HHPT follow up.    Follow Up Recommendations Home health PT    Equipment Recommendations  None recommended by PT    Recommendations for Other Services OT consult     Precautions / Restrictions Precautions Precautions: Fall Precaution Comments: c/o dizziness with mobility Restrictions Weight Bearing Restrictions: No Other Position/Activity Restrictions: WBAT      Mobility  Bed Mobility Overal bed mobility: Needs Assistance Bed Mobility: Supine to Sit;Sit to Supine     Supine to sit: Min assist Sit to supine: Min assist   General bed mobility comments: cues for sequence and use of L LE to self assist  Transfers Overall transfer level: Needs assistance Equipment used: Rolling walker (2 wheeled) Transfers: Sit to/from Stand Sit to Stand: Min guard         General transfer comment: cues for UE/LE placement  Ambulation/Gait Ambulation/Gait assistance: Min assist;Min guard Ambulation Distance (Feet): 16 Feet Assistive device: Rolling walker (2 wheeled) Gait Pattern/deviations: Step-to pattern;Step-through pattern;Decreased step length - right;Decreased step length - left;Shuffle;Drifts right/left     General Gait Details: cues for posture, position from RW and initial sequence  Stairs            Wheelchair Mobility    Modified Rankin (Stroke Patients Only)       Balance                                             Pertinent Vitals/Pain Pain Assessment: 0-10 Pain Score: 2  Pain Location: R hip Pain Descriptors / Indicators:  Tender Pain Intervention(s): Limited activity within patient's tolerance;Monitored during session    Home Living Family/patient expects to be discharged to:: Private residence Living Arrangements: Spouse/significant other;Children Available Help at Discharge: Family Type of Home: House Home Access: Stairs to enter Entrance Stairs-Rails: Right;Left;Can reach both Technical brewer of Steps: 2+1 Home Layout: Two level Home Equipment: Bedside commode;Shower seat - built in      Prior Function Level of Independence: Independent               Hand Dominance   Dominant Hand: Right    Extremity/Trunk Assessment   Upper Extremity Assessment: Overall WFL for tasks assessed           Lower Extremity Assessment: RLE deficits/detail RLE Deficits / Details: 3-/5 hip strength with AAROM at hip to 95 flex and 25 abd    Cervical / Trunk Assessment: Normal  Communication   Communication: No difficulties  Cognition Arousal/Alertness: Awake/alert Behavior During Therapy: WFL for tasks assessed/performed Overall Cognitive Status: Within Functional Limits for tasks assessed                      General Comments      Exercises Total Joint Exercises Ankle Circles/Pumps: AROM;Both;15 reps;Supine Quad Sets: AROM;Both;10 reps;Supine Heel Slides: AAROM;Right;Supine;20 reps Hip ABduction/ADduction: AAROM;Right;Supine;20 reps      Assessment/Plan    PT Assessment Patient needs continued PT services  PT Diagnosis Difficulty walking  PT Problem List Decreased strength;Decreased activity tolerance;Decreased range of motion;Decreased mobility;Decreased knowledge of use of DME;Pain  PT Treatment Interventions DME instruction;Gait training;Stair training;Functional mobility training;Therapeutic activities;Therapeutic exercise;Patient/family education   PT Goals (Current goals can be found in the Care Plan section) Acute Rehab PT Goals Patient Stated Goal: Resume  previous lifestyle with decreased pain PT Goal Formulation: With patient Time For Goal Achievement: 06/08/14 Potential to Achieve Goals: Good    Frequency 7X/week   Barriers to discharge        Co-evaluation               End of Session Equipment Utilized During Treatment: Gait belt Activity Tolerance: Patient tolerated treatment well Patient left: in bed;with call bell/phone within reach;with family/visitor present Nurse Communication: Mobility status         Time: 2297-9892 PT Time Calculation (min): 32 min   Charges:   PT Evaluation $Initial PT Evaluation Tier I: 1 Procedure PT Treatments $Gait Training: 8-22 mins $Therapeutic Exercise: 8-22 mins   PT G Codes:          Annabeth Tortora 05/27/2014, 4:29 PM

## 2014-05-28 DIAGNOSIS — D62 Acute posthemorrhagic anemia: Secondary | ICD-10-CM | POA: Diagnosis present

## 2014-05-28 DIAGNOSIS — F17210 Nicotine dependence, cigarettes, uncomplicated: Secondary | ICD-10-CM | POA: Diagnosis present

## 2014-05-28 LAB — TYPE AND SCREEN
ABO/RH(D): O POS
ANTIBODY SCREEN: NEGATIVE
Unit division: 0
Unit division: 0

## 2014-05-28 LAB — BASIC METABOLIC PANEL
ANION GAP: 8 (ref 5–15)
BUN: 5 mg/dL — ABNORMAL LOW (ref 6–23)
CALCIUM: 8.2 mg/dL — AB (ref 8.4–10.5)
CO2: 28 meq/L (ref 19–32)
Chloride: 98 mEq/L (ref 96–112)
Creatinine, Ser: 0.65 mg/dL (ref 0.50–1.10)
GFR calc non Af Amer: 89 mL/min — ABNORMAL LOW (ref >90)
Glucose, Bld: 101 mg/dL — ABNORMAL HIGH (ref 70–99)
Potassium: 3.3 mEq/L — ABNORMAL LOW (ref 3.7–5.3)
Sodium: 134 mEq/L — ABNORMAL LOW (ref 137–147)

## 2014-05-28 LAB — CBC
HCT: 27.8 % — ABNORMAL LOW (ref 36.0–46.0)
Hemoglobin: 9.7 g/dL — ABNORMAL LOW (ref 12.0–15.0)
MCH: 30.4 pg (ref 26.0–34.0)
MCHC: 34.9 g/dL (ref 30.0–36.0)
MCV: 87.1 fL (ref 78.0–100.0)
PLATELETS: 184 10*3/uL (ref 150–400)
RBC: 3.19 MIL/uL — ABNORMAL LOW (ref 3.87–5.11)
RDW: 13.7 % (ref 11.5–15.5)
WBC: 6 10*3/uL (ref 4.0–10.5)

## 2014-05-28 MED ORDER — FLUCONAZOLE 150 MG PO TABS
150.0000 mg | ORAL_TABLET | Freq: Once | ORAL | Status: AC
  Administered 2014-05-28: 150 mg via ORAL
  Filled 2014-05-28: qty 1

## 2014-05-28 MED ORDER — IPRATROPIUM BROMIDE 0.02 % IN SOLN
0.5000 mg | Freq: Once | RESPIRATORY_TRACT | Status: AC
  Administered 2014-05-28: 0.5 mg via RESPIRATORY_TRACT
  Filled 2014-05-28: qty 2.5

## 2014-05-28 MED ORDER — IPRATROPIUM-ALBUTEROL 0.5-2.5 (3) MG/3ML IN SOLN: 3.0000 mL | RESPIRATORY_TRACT | Status: DC | PRN

## 2014-05-28 MED ORDER — ALBUTEROL SULFATE (2.5 MG/3ML) 0.083% IN NEBU
5.0000 mg | INHALATION_SOLUTION | Freq: Once | RESPIRATORY_TRACT | Status: AC
  Administered 2014-05-28: 5 mg via RESPIRATORY_TRACT
  Filled 2014-05-28: qty 6

## 2014-05-28 MED ORDER — BUDESONIDE 0.25 MG/2ML IN SUSP
0.2500 mg | Freq: Two times a day (BID) | RESPIRATORY_TRACT | Status: DC
  Administered 2014-05-28 – 2014-05-30 (×4): 0.25 mg via RESPIRATORY_TRACT
  Filled 2014-05-28 (×8): qty 2

## 2014-05-28 NOTE — Progress Notes (Signed)
PROGRESS NOTE  Bianca [name] SWN:[id number] DOB: [birth year] DOA: 05/26/2014 PCP: Donia Ast, FNP  HPI: Pt is 68 yo female just discharged from the hospital 10/09 after having right hip arthroplasty, presented back to [facility] ED with main concern of persistent pain in the right hip area with nausea and poor oral intake. Found to be febrile in the ED and with cough/sputum production  Subjective/ 24 H Interval events - admitted overnight, feeling better this morning, still with hip pain  Assessment/Plan: Sepsis from presumed HCAP - no clear infectious etiology on CXR, UA unremarkable however she endorses cough with sputum production, febrile to 103 in the ED and tachycardic - will continue Vanc and Maxipime for treatment of presumptive HCAP  - sputum analysis requested as well urine legionella and strep pnemuo  - provide antiemetics as needed  S/P right hip arthroplasty  - provide analgesia as needed  - PT evaluation  - resume Xarelto per Ortho rec's  - may need to hold if further drop in Hg noted  Acute on chronic blood loss anemia  - post op related  - CBC stable Vaginal discharge - Diflucan - has been on and off on antibiotics for tooth infections/sinusitis for the past month HTN - continue Metoprolol as per home medical regimen  Tobacco abuse  - patient has 40+ pack year smoking history, decreased breath sounds on exam, suspect underlying COPD.  - advised to quit - advised to ask PCP for formal PFTs once acute issue resolves - add Pulmicort given some wheezing last night.   Diet: regular Fluids: NS DVT Prophylaxis: Xarelto  Code Status: Full Family Communication: d/w patient  Disposition Plan: inpatient  Consultants:  Orthopedic surgery   Procedures:  None    Antibiotics Vancomycin 10/10 >> Cefepime 10/10 >>  Studies  Filed Vitals:   05/27/14 2010 05/28/14 0320 05/28/14 0414 05/28/14 0455  BP: 111/57 107/42 119/61   Pulse: 69 72 71   Temp: 98.2  F (36.8 C) 99.2 F (37.3 C) 98.5 F (36.9 C)   TempSrc: Oral Oral Oral   Resp: 14 20 18    Height:      Weight:      SpO2: 95% 94% 93% 95%    Intake/Output Summary (Last 24 hours) at 05/28/14 0800 Last data filed at 05/28/14 0725  Gross per 24 hour  Intake   1935 ml  Output   1925 ml  Net     10 ml   Filed Weights   05/27/14 0110  Weight: 71.7 kg (158 lb 1.1 oz)   Exam:  General:  NAD  Cardiovascular: RRR  Respiratory: somewhat decreased breath sounds, scattered wheezing  Abdomen: soft, non tender  MSK: no edema  Data Reviewed: Basic Metabolic Panel:  Recent Labs Lab 05/25/14 0500 05/26/14 0430 05/27/14 0631 05/28/14 0500  NA 127* 137 133* 134*  K 4.5 4.2 3.4* 3.3*  CL 94* 101 98 98  CO2 25 26 24 28   GLUCOSE 130* 129* 110* 101*  BUN 4* 6 6 5*  CREATININE 0.57 0.61 0.65 0.65  CALCIUM 8.6 8.7 7.7* 8.2*   CBC:  Recent Labs Lab 05/25/14 0500 05/26/14 0430 05/26/14 2347 05/27/14 0631 05/28/14 0500  WBC 9.5 11.7* 7.4 5.3 6.0  NEUTROABS  --   --  6.0  --   --   HGB 8.6* 7.9* 7.6* 6.7* 9.7*  HCT 24.5* 22.6* 22.1* 19.0* 27.8*  MCV 86.9 88.6 88.8 89.6 87.1  PLT 217 241 223 178 184  Cardiac Enzymes: No results found for this basename: CKTOTAL, CKMB, CKMBINDEX, TROPONINI,  in the last 168 hours BNP (last 3 results)  Recent Labs  12/19/13 0743  PROBNP 353.3*   CBG:  Recent Labs Lab 05/27/14 0400  GLUCAP 123*    Recent Results (from the past 240 hour(s))  SURGICAL PCR SCREEN     Status: None   Collection Time    05/19/14  2:43 PM      Result Value Ref Range Status   MRSA, PCR NEGATIVE  NEGATIVE Final   Staphylococcus aureus NEGATIVE  NEGATIVE Final   Comment:            The Xpert SA Assay (FDA     approved for NASAL specimens     in patients over 68 years of age),     is one component of     a comprehensive surveillance     program.  Test performance has     been validated by [employer] for patients greater     than or  equal to 43 year old.     It is not intended     to diagnose infection nor to     guide or monitor treatment.     Studies: Dg Chest 2 View  05/27/2014   CLINICAL DATA:  Persistent nausea and right hip pain, status post recent hip replacement. Initial encounter.  EXAM: CHEST  2 VIEW  COMPARISON:  Chest radiograph from 12/19/2013  FINDINGS: The lungs are hypoexpanded but appear grossly clear. There is no evidence of focal opacification, pleural effusion or pneumothorax.  The heart is normal in size; the mediastinal contour is within normal limits. No acute osseous abnormalities are seen.  IMPRESSION: Lungs hypoexpanded but grossly clear.   Electronically Signed   By: Garald Balding M.D.   On: 05/27/2014 00:32    Dg Chest 2 View  05/27/2014   CLINICAL DATA:  Persistent nausea and right hip pain, status post recent hip replacement. Initial encounter.  EXAM: CHEST  2 VIEW  COMPARISON:  Chest radiograph from 12/19/2013  FINDINGS: The lungs are hypoexpanded but appear grossly clear. There is no evidence of focal opacification, pleural effusion or pneumothorax.  The heart is normal in size; the mediastinal contour is within normal limits. No acute osseous abnormalities are seen.  IMPRESSION: Lungs hypoexpanded but grossly clear.   Electronically Signed   By: Garald Balding M.D.   On: 05/27/2014 00:32    Scheduled Meds: . sodium chloride   Intravenous Once  . atorvastatin  40 mg Oral q1800  . ceFEPime (MAXIPIME) IV  1 g Intravenous Q8H  . escitalopram  20 mg Oral q morning - 10a  . ferrous sulfate  325 mg Oral BID PC  . Influenza vac split quadrivalent PF  0.5 mL Intramuscular Tomorrow-1000  . metoprolol  25 mg Oral BID  . pantoprazole  40 mg Oral QPM  . rivaroxaban  10 mg Oral Q breakfast  . senna-docusate  1 tablet Oral BID  . vancomycin  1,000 mg Intravenous Q12H   Continuous Infusions:   Active Problems:   HCAP (healthcare-associated pneumonia)   Sepsis   Anemia associated with acute  blood loss   Tobacco abuse  Time spent: [place], MD [facility] Pager [phone]. If 7 PM - 7 AM, please contact night-coverage at [email or web address], password [facility] 05/28/2014, 8:00 AM  LOS: 2 days

## 2014-05-28 NOTE — Progress Notes (Signed)
Patient noted to have expiratory wheezes in all lung fields. Pt also c/o mild SOB with non-productive cough. Temp 99.2, Oxygen sats 94% on RA, resp 20, BP 107/42, HR 72. NP on call aware. New orders placed. Will continue to monitor closely.

## 2014-05-28 NOTE — Progress Notes (Signed)
Physical Therapy Treatment Patient Details Name: Bianca [name] MRN: [id number] DOB: Aug 02, 1946 Today's Date: 05/28/2014    History of Present Illness Pt s/p ant direct R THR 05/24/14.  Readmitted with UTI  and Hgb 6.7    PT Comments    Pt reports continued lightheadedness with mobility but less than yesterday.  BP sitting EOB 119/61; after ambulating 116/42 with HR 72 and SaO2 93% - RN aware.  Follow Up Recommendations  Home health PT     Equipment Recommendations  None recommended by PT    Recommendations for Other Services OT consult     Precautions / Restrictions Precautions Precautions: Fall Precaution Comments: c/o dizziness with mobility Restrictions Weight Bearing Restrictions: No Other Position/Activity Restrictions: WBAT    Mobility  Bed Mobility Overal bed mobility: Needs Assistance Bed Mobility: Supine to Sit     Supine to sit: Min assist     General bed mobility comments: cues for sequence and use of L LE to self assist  Transfers Overall transfer level: Needs assistance Equipment used: Rolling walker (2 wheeled) Transfers: Sit to/from Stand Sit to Stand: Min guard         General transfer comment: cues for UE/LE placement  Ambulation/Gait Ambulation/Gait assistance: Min guard Ambulation Distance (Feet): 230 Feet Assistive device: Rolling walker (2 wheeled) Gait Pattern/deviations: Step-to pattern;Step-through pattern;Decreased step length - right;Decreased step length - left;Shuffle;Trunk flexed     General Gait Details: cues for posture, position from [employer]            Wheelchair Mobility    Modified Rankin (Stroke Patients Only)       Balance                                    Cognition Arousal/Alertness: Awake/alert Behavior During Therapy: WFL for tasks assessed/performed Overall Cognitive Status: Within Functional Limits for tasks assessed                      Exercises Total Joint  Exercises Ankle Circles/Pumps: AROM;Both;15 reps;Supine Quad Sets: AROM;Both;10 reps;Supine Gluteal Sets: AROM;Both;10 reps;Supine Heel Slides: AAROM;Right;Supine;20 reps Hip ABduction/ADduction: AAROM;Right;Supine;20 reps Long Arc Quad: AROM;10 reps;Seated;Right    General Comments        Pertinent Vitals/Pain Pain Assessment: 0-10 Pain Score: 3  Pain Location: R hip Pain Descriptors / Indicators: Sore Pain Intervention(s): Limited activity within patient's tolerance;Monitored during session;Premedicated before session    Home Living                      Prior Function            PT Goals (current goals can now be found in the care plan section) Acute Rehab PT Goals Patient Stated Goal: Resume previous lifestyle with decreased pain PT Goal Formulation: With patient Time For Goal Achievement: 06/08/14 Potential to Achieve Goals: Good Progress towards PT goals: Progressing toward goals    Frequency  7X/week    PT Plan Current plan remains appropriate    Co-evaluation             End of Session Equipment Utilized During Treatment: Gait belt Activity Tolerance: Patient tolerated treatment well;Other (comment) (c/o lightheadedness) Patient left: in chair;with call bell/phone within reach     Time: 0830-0913 PT Time Calculation (min): 43 min  Charges:  $Gait Training: 23-37 mins $Therapeutic Exercise: 8-22 mins  G Codes:      Renise Gillies 06/08/14, 12:43 PM

## 2014-05-28 NOTE — Progress Notes (Addendum)
   Subjective:     Pt feeling about the same as yesterday in regards to malaise C/o vaginal burning with itching with occasional discharge that has been going on for about 1 week Minimal pain to right hip Current UA is negative except for trace blood Blood counts have improved since transfusion yesterday Patient reports pain as mild.  Objective:   VITALS:   Filed Vitals:   05/28/14 0414  BP: 119/61  Pulse: 71  Temp: 98.5 F (36.9 C)  Resp: 18    Right hip incision healing well nv intact distally No rashes or edema  LABS  Recent Labs  05/26/14 2347 05/27/14 0631 05/28/14 0500  HGB 7.6* 6.7* 9.7*  HCT 22.1* 19.0* 27.8*  WBC 7.4 5.3 6.0  PLT 223 178 184     Recent Labs  05/26/14 0430 05/27/14 0631 05/28/14 0500  NA 137 133* 134*  K 4.2 3.4* 3.3*  BUN 6 6 5*  CREATININE 0.61 0.65 0.65  GLUCOSE 129* 110* 101*     Assessment/Plan:    Recommend pelvic exam with cultures to determine if a vaginitis could be causing some of her symptoms Blood loss anemia is improved s/p transfusion Currently right hip looks good with no signs of infection Will continue to monitor her progress  Agree with above assessment and plan Patient up with therapy ambulating in the halls, weight bearing with no pain Hip incision looks perfect - no erythema no drainage  Agree with aggressively pursuing and treating other potential sites for infection.  There is no clinical indication of surgical wound infection at this time.  Netta Cedars MD   Merla Riches, Butts, PA-C  05/28/2014, 7:36 AM

## 2014-05-29 LAB — BASIC METABOLIC PANEL
ANION GAP: 10 (ref 5–15)
BUN: 3 mg/dL — ABNORMAL LOW (ref 6–23)
CHLORIDE: 98 meq/L (ref 96–112)
CO2: 29 mEq/L (ref 19–32)
Calcium: 8.5 mg/dL (ref 8.4–10.5)
Creatinine, Ser: 0.64 mg/dL (ref 0.50–1.10)
GFR, EST NON AFRICAN AMERICAN: 90 mL/min — AB (ref >90)
Glucose, Bld: 121 mg/dL — ABNORMAL HIGH (ref 70–99)
POTASSIUM: 3.8 meq/L (ref 3.7–5.3)
SODIUM: 137 meq/L (ref 137–147)

## 2014-05-29 LAB — CBC
HCT: 30.4 % — ABNORMAL LOW (ref 36.0–46.0)
Hemoglobin: 10.6 g/dL — ABNORMAL LOW (ref 12.0–15.0)
MCH: 30.6 pg (ref 26.0–34.0)
MCHC: 34.9 g/dL (ref 30.0–36.0)
MCV: 87.9 fL (ref 78.0–100.0)
PLATELETS: 219 10*3/uL (ref 150–400)
RBC: 3.46 MIL/uL — ABNORMAL LOW (ref 3.87–5.11)
RDW: 13.7 % (ref 11.5–15.5)
WBC: 5.7 10*3/uL (ref 4.0–10.5)

## 2014-05-29 LAB — POCT I-STAT, CHEM 8
BUN: 4 mg/dL — ABNORMAL LOW (ref 6–23)
CHLORIDE: 93 meq/L — AB (ref 96–112)
Calcium, Ion: 1.08 mmol/L — ABNORMAL LOW (ref 1.13–1.30)
Creatinine, Ser: 0.7 mg/dL (ref 0.50–1.10)
Glucose, Bld: 110 mg/dL — ABNORMAL HIGH (ref 70–99)
HEMATOCRIT: 23 % — AB (ref 36.0–46.0)
HEMOGLOBIN: 7.8 g/dL — AB (ref 12.0–15.0)
POTASSIUM: 3.7 meq/L (ref 3.7–5.3)
SODIUM: 129 meq/L — AB (ref 137–147)
TCO2: 23 mmol/L (ref 0–100)

## 2014-05-29 LAB — CG4 I-STAT (LACTIC ACID): LACTIC ACID, VENOUS: 1.06 mmol/L (ref 0.5–2.2)

## 2014-05-29 MED ORDER — LEVOFLOXACIN 750 MG PO TABS
750.0000 mg | ORAL_TABLET | Freq: Every day | ORAL | Status: DC
  Administered 2014-05-29 – 2014-05-30 (×2): 750 mg via ORAL
  Filled 2014-05-29 (×2): qty 1

## 2014-05-29 NOTE — Progress Notes (Signed)
Pt Right hip incision is pink.  Area marked.  No drainage noted.  Steristrips intact

## 2014-05-29 NOTE — Progress Notes (Signed)
Subjective: Hospital day - 3 Patient reports pain as mild pain in the hip when she gets up. Patient returned to the hospital on Friday evening after being discharged from [facility] earlier that day.  Her HGB was 7.9 at time of discharged but she had walked 200 feet the day before and 80 feet day of discharge with no complaints of dizziness as per PT. She states that she did not feel alert and that she developed some mucus type cough along with some nausea.  She could not catch her breath with the mucus but denied being short of breath.   She came back into the hospital for evaluation.  She was noted to have an elevated temp and productive cough. She was also given a one time dose of Diflucan during the hospital stay for complaints of a developing yeast infection.  She denies any further issues at this time since that dosing.  HGB had dropped to 6.7 upon return and given blood.  HGB came back up to 9.7. She got up with therapy and walked 230 feet yesterday with PT and documented lightheadedness "but less than yesterday" as per therapy. Nursing staff noted early this morning some redness in and around the incision. Patient is well, but has had some minor complaints of pain in the thigh, requiring pain medications Plan is to go back Home after hospital stay.  Objective: Vital signs in last 24 hours: Temp:  [97.9 F (36.6 C)-98.5 F (36.9 C)] 98.5 F (36.9 C) (10/12 0430) Pulse Rate:  [59-69] 65 (10/12 0430) Resp:  [18-20] 18 (10/12 0430) BP: (104-129)/(46-73) 129/73 mmHg (10/12 0430) SpO2:  [96 %-98 %] 96 % (10/12 0430)  Intake/Output from previous day:  Intake/Output Summary (Last 24 hours) at 05/29/14 0735 Last data filed at 05/29/14 0437  Gross per 24 hour  Intake   1580 ml  Output   3450 ml  Net  -1870 ml    Labs:  Recent Labs  05/26/14 2347 05/27/14 0631 05/28/14 0500 05/29/14 0440  HGB 7.6* 6.7* 9.7* 10.6*    Recent Labs  05/28/14 0500 05/29/14 0440  WBC 6.0 5.7    RBC 3.19* 3.46*  HCT 27.8* 30.4*  PLT 184 219    Recent Labs  05/28/14 0500 05/29/14 0440  NA 134* 137  K 3.3* 3.8  CL 98 98  CO2 28 29  BUN 5* 3*  CREATININE 0.65 0.64  GLUCOSE 101* 121*  CALCIUM 8.2* 8.5   No results found for this basename: LABPT, INR,  in the last 72 hours  EXAM General - Patient is Alert, Appropriate during morning visit Extremity - Neurovascular intact Sensation intact distally redness noted on the anterior and medial portion of the right proximal thtigh. Imcision - no drainage and some mild bruising noted Motor Function - intact, moving foot and toes well on exam.   Past Medical History  Diagnosis Date  . CAD (coronary artery disease)     a. 1997 MI/PCI RCA;  b. 2007 MI/PCI of 100% RCA with Taxus DES x 3 placed, EF 55%;  c. 2010 Cath: stable anatomy;  d. 05/2012 NSTEMI/Cath/PCI: LM nl, LAD 60-38m, D1 20ost, LCX 9m, RCA 40-13m ISR, 60/95d (Treated w/ 2.75x33 Xience Xpedition DES), PDA 33m (Treated w/ PTCA), EF 60%.  . Depression   . Hyperlipidemia   . Carpal tunnel syndrome on both sides   . Numbness of foot left foot    "never recovered from last back OR, 2009" (05/17/2012)  . GERD (gastroesophageal  reflux disease)   . Arthritis     "back; probably knees" (05/17/2012)  . Chronic lower back pain   . Anginal pain   . Myocardial infarction  1997, 2007 , last 2013    x 3    Assessment/Plan: Hospital day - 3 Active Problems:   HCAP (healthcare-associated pneumonia)   Sepsis   Anemia associated with acute blood loss   Tobacco abuse  Estimated body mass index is 28.36 kg/(m^2) as calculated from the following:   Height as of this encounter: 5' 2.6" (1.59 m).   Weight as of this encounter: 71.7 kg (158 lb 1.1 oz). Up with therapy Dr. Wynelle Link to evaluate later this afternoon   Arlee Muslim, PA-C Orthopaedic Surgery 05/29/2014, 7:35 AM

## 2014-05-29 NOTE — Progress Notes (Addendum)
PROGRESS NOTE  Bianca [name] MEQ:[id number] DOB: [birth year] DOA: 05/26/2014 PCP: Donia Ast, FNP  HPI: Pt is 68 yo female just discharged from the hospital 10/09 after having right hip arthroplasty, presented back to [facility] ED with main concern of persistent pain in the right hip area with nausea and poor oral intake. Found to be febrile in the ED and with cough/sputum production  Subjective/ 24 H Interval events - feeling better, complains of redness right thigh   Assessment/Plan: Sepsis from presumed HCAP - no clear infectious etiology on CXR, UA unremarkable however she endorses cough with sputum production, febrile to 103 in the ED and tachycardic - for first 48 hours she was on Vanc and Maxipime for treatment of presumptive HCAP, transitioned today to Levofloxacin - sputum analysis requested as well urine legionella and strep pnemuo  - breathing better, on room air S/P right hip arthroplasty  - provide analgesia as needed  - PT evaluation  - resume Xarelto per Ortho rec's  - overnight worsening new cellulitic appearing area on anterior thigh despite being on broad spectrum antibiotics. Discussed with orthopedic surgery, appreciate input on whether this may be a deeper infection or related to recent surgery.   Acute on chronic blood loss anemia  - post op related  - CBC stable Vaginal discharge - Diflucan - has been on and off on antibiotics for tooth infections/sinusitis for the past month HTN - continue Metoprolol as per home medical regimen  Tobacco abuse  - patient has 40+ pack year smoking history, decreased breath sounds on exam, suspect underlying COPD.  - advised to quit - advised to ask PCP for formal PFTs once acute issue resolves - add Pulmicort given some wheezing .   Diet: regular Fluids: NS DVT Prophylaxis: Xarelto  Code Status: Full Family Communication: d/w patient  Disposition Plan: inpatient  Consultants:  Orthopedic surgery    Procedures:  None    Antibiotics Vancomycin 10/10 >> 10/12 Cefepime 10/10 >> 10/12 Levofloxacin 10/12 >>  Studies  Filed Vitals:   05/29/14 0430 05/29/14 0959 05/29/14 1134 05/29/14 1255  BP: 129/73  144/68 135/68  Pulse: 65  61 55  Temp: 98.5 F (36.9 C)   97.5 F (36.4 C)  TempSrc: Oral   Oral  Resp: 18   16  Height:      Weight:      SpO2: 96% 91%  94%    Intake/Output Summary (Last 24 hours) at 05/29/14 1333 Last data filed at 05/29/14 1255  Gross per 24 hour  Intake   1700 ml  Output   3800 ml  Net  -2100 ml   Filed Weights   05/27/14 0110  Weight: 71.7 kg (158 lb 1.1 oz)   Exam:  General:  NAD  Cardiovascular: RRR  Respiratory: somewhat decreased breath sounds, no wheezing  Abdomen: soft, non tender  MSK: no edema  Data Reviewed: Basic Metabolic Panel:  Recent Labs Lab 05/25/14 0500 05/26/14 0430 05/27/14 0001 05/27/14 0631 05/28/14 0500 05/29/14 0440  NA 127* 137 129* 133* 134* 137  K 4.5 4.2 3.7 3.4* 3.3* 3.8  CL 94* 101 93* 98 98 98  CO2 25 26  --  24 28 29   GLUCOSE 130* 129* 110* 110* 101* 121*  BUN 4* 6 4* 6 5* 3*  CREATININE 0.57 0.61 0.70 0.65 0.65 0.64  CALCIUM 8.6 8.7  --  7.7* 8.2* 8.5   CBC:  Recent Labs Lab 05/26/14 0430 05/26/14 2347 05/27/14 0001 05/27/14 0631  05/28/14 0500 05/29/14 0440  WBC 11.7* 7.4  --  5.3 6.0 5.7  NEUTROABS  --  6.0  --   --   --   --   HGB 7.9* 7.6* 7.8* 6.7* 9.7* 10.6*  HCT 22.6* 22.1* 23.0* 19.0* 27.8* 30.4*  MCV 88.6 88.8  --  89.6 87.1 87.9  PLT 241 223  --  178 184 219   BNP (last 3 results)  Recent Labs  12/19/13 0743  PROBNP 353.3*   CBG:  Recent Labs Lab 05/27/14 0400  GLUCAP 123*    Recent Results (from the past 240 hour(s))  SURGICAL PCR SCREEN     Status: None   Collection Time    05/19/14  2:43 PM      Result Value Ref Range Status   MRSA, PCR NEGATIVE  NEGATIVE Final   Staphylococcus aureus NEGATIVE  NEGATIVE Final   Comment:            The Xpert SA  Assay (FDA     approved for NASAL specimens     in patients over 21 years of age),     is one component of     a comprehensive surveillance     program.  Test performance has     been validated by [employer] for patients greater     than or equal to 55 year old.     It is not intended     to diagnose infection nor to     guide or monitor treatment.  CULTURE, BLOOD (ROUTINE X 2)     Status: None   Collection Time    05/27/14 12:05 AM      Result Value Ref Range Status   Specimen Description BLOOD LEFT ANTECUBITAL   Final   Special Requests BOTTLES DRAWN AEROBIC AND ANAEROBIC 5ML   Final   Culture  Setup Time     Final   Value: 05/27/2014 03:19     Performed at [facility]   Culture     Final   Value:        BLOOD CULTURE RECEIVED NO GROWTH TO DATE CULTURE WILL BE HELD FOR 5 DAYS BEFORE ISSUING A FINAL NEGATIVE REPORT     Performed at [facility]   Report Status PENDING   Incomplete  CULTURE, BLOOD (ROUTINE X 2)     Status: None   Collection Time    05/27/14 12:10 AM      Result Value Ref Range Status   Specimen Description BLOOD NO DRAW LOCATION   Final   Special Requests BOTTLES DRAWN AEROBIC AND ANAEROBIC 3.5 MLS   Final   Culture  Setup Time     Final   Value: 05/27/2014 03:20     Performed at [facility]   Culture     Final   Value:        BLOOD CULTURE RECEIVED NO GROWTH TO DATE CULTURE WILL BE HELD FOR 5 DAYS BEFORE ISSUING A FINAL NEGATIVE REPORT     Performed at [facility]   Report Status PENDING   Incomplete     Studies: No results found.  No results found.  Scheduled Meds: . sodium chloride   Intravenous Once  . atorvastatin  40 mg Oral q1800  . budesonide (PULMICORT) nebulizer solution  0.25 mg Nebulization BID  . escitalopram  20 mg Oral q morning - 10a  . ferrous sulfate  325 mg Oral BID  PC  . Influenza vac split quadrivalent PF  0.5 mL Intramuscular Tomorrow-1000  . levofloxacin  750 mg Oral Daily  .  metoprolol  25 mg Oral BID  . pantoprazole  40 mg Oral QPM  . rivaroxaban  10 mg Oral Q breakfast  . senna-docusate  1 tablet Oral BID   Continuous Infusions:   Active Problems:   HCAP (healthcare-associated pneumonia)   Sepsis   Anemia associated with acute blood loss   Tobacco abuse  Time spent: 35 including 50% of time spent in discussions with family members and orthopedic care team  Marzetta Board, MD [facility] Pager [phone]. If 7 PM - 7 AM, please contact night-coverage at [email or web address], password [facility] 05/29/2014, 1:33 PM  LOS: 3 days

## 2014-05-29 NOTE — Progress Notes (Signed)
ANTIBIOTIC CONSULT NOTE - INITIAL  Pharmacy Consult for Levaquin Indication: HCAP  Allergies  Allergen Reactions  . Oxycodone Hcl Itching and Other (See Comments)    confusion  . Penicillins Rash  . Macrobid [Nitrofurantoin Monohyd Macro] Nausea And Vomiting  . Sulfa Antibiotics Nausea Only    Patient Measurements: Height: 5' 2.6" (159 cm) Weight: 158 lb 1.1 oz (71.7 kg) IBW/kg (Calculated) : 51.48  Vital Signs: Temp: 98.5 F (36.9 C) (10/12 0430) Temp Source: Oral (10/12 0430) BP: 129/73 mmHg (10/12 0430) Pulse Rate: 65 (10/12 0430) Intake/Output from previous day: 10/11 0701 - 10/12 0700 In: 1580 [P.O.:1080; IV Piggyback:500] Out: 3650 [Urine:3650] Intake/Output from this shift:    Labs:  Recent Labs  05/27/14 0631 05/28/14 0500 05/29/14 0440  WBC 5.3 6.0 5.7  HGB 6.7* 9.7* 10.6*  PLT 178 184 219  CREATININE 0.65 0.65 0.64   Estimated Creatinine Clearance: 63.3 ml/min (by C-G formula based on Cr of 0.64). No results found for this basename: VANCOTROUGH, Corlis Leak, VANCORANDOM, GENTTROUGH, GENTPEAK, GENTRANDOM, TOBRATROUGH, TOBRAPEAK, TOBRARND, AMIKACINPEAK, AMIKACINTROU, AMIKACIN,  in the last 72 hours   Microbiology: Recent Results (from the past 720 hour(s))  SURGICAL PCR SCREEN     Status: None   Collection Time    05/19/14  2:43 PM      Result Value Ref Range Status   MRSA, PCR NEGATIVE  NEGATIVE Final   Staphylococcus aureus NEGATIVE  NEGATIVE Final   Comment:            The Xpert SA Assay (FDA     approved for NASAL specimens     in patients over 53 years of age),     is one component of     a comprehensive surveillance     program.  Test performance has     been validated by [employer] for patients greater     than or equal to 43 year old.     It is not intended     to diagnose infection nor to     guide or monitor treatment.  CULTURE, BLOOD (ROUTINE X 2)     Status: None   Collection Time    05/27/14 12:05 AM      Result Value Ref  Range Status   Specimen Description BLOOD LEFT ANTECUBITAL   Final   Special Requests BOTTLES DRAWN AEROBIC AND ANAEROBIC 5ML   Final   Culture  Setup Time     Final   Value: 05/27/2014 03:19     Performed at [facility]   Culture     Final   Value:        BLOOD CULTURE RECEIVED NO GROWTH TO DATE CULTURE WILL BE HELD FOR 5 DAYS BEFORE ISSUING A FINAL NEGATIVE REPORT     Performed at [facility]   Report Status PENDING   Incomplete  CULTURE, BLOOD (ROUTINE X 2)     Status: None   Collection Time    05/27/14 12:10 AM      Result Value Ref Range Status   Specimen Description BLOOD NO DRAW LOCATION   Final   Special Requests BOTTLES DRAWN AEROBIC AND ANAEROBIC 3.5 MLS   Final   Culture  Setup Time     Final   Value: 05/27/2014 03:20     Performed at [facility]   Culture     Final   Value:        BLOOD CULTURE RECEIVED NO  GROWTH TO DATE CULTURE WILL BE HELD FOR 5 DAYS BEFORE ISSUING A FINAL NEGATIVE REPORT     Performed at [facility]   Report Status PENDING   Incomplete    Medical History: Past Medical History  Diagnosis Date  . CAD (coronary artery disease)     a. 1997 MI/PCI RCA;  b. 2007 MI/PCI of 100% RCA with Taxus DES x 3 placed, EF 55%;  c. 2010 Cath: stable anatomy;  d. 05/2012 NSTEMI/Cath/PCI: LM nl, LAD 60-62m, D1 20ost, LCX 26m, RCA 40-73m ISR, 60/95d (Treated w/ 2.75x33 Xience Xpedition DES), PDA 51m (Treated w/ PTCA), EF 60%.  . Depression   . Hyperlipidemia   . Carpal tunnel syndrome on both sides   . Numbness of foot left foot    "never recovered from last back OR, 2009" (05/17/2012)  . GERD (gastroesophageal reflux disease)   . Arthritis     "back; probably knees" (05/17/2012)  . Chronic lower back pain   . Anginal pain   . Myocardial infarction  1997, 2007 , last 2013    x 3    Medications:  Scheduled:  . sodium chloride   Intravenous Once  . atorvastatin  40 mg Oral q1800  . budesonide (PULMICORT) nebulizer solution   0.25 mg Nebulization BID  . escitalopram  20 mg Oral q morning - 10a  . ferrous sulfate  325 mg Oral BID PC  . Influenza vac split quadrivalent PF  0.5 mL Intramuscular Tomorrow-1000  . metoprolol  25 mg Oral BID  . pantoprazole  40 mg Oral QPM  . rivaroxaban  10 mg Oral Q breakfast  . senna-docusate  1 tablet Oral BID   Infusions:    Assessment: 68 yr female discharged on 10/9 s/p THR on 10/7.  Returned to ED later that night with complaint of nausea. Patient found to be febrile (Temp = 103F), blood cultures x 2, sputum culture ordered, and patient started on vancomycin and cefepime per pharmacy for HCAP on 10/10. Now transitioning to Levaquin per pharmacy dosing on 10/12  10/10 >> vancomycin >> 10/12 10/10 >> cefepime >> 10/12 10/12 >> Levaquin >>   Afebrile  Stable SCr, CrCl 63  WBC WNL  Cultures ngtd  Goal of Therapy:   Eradication of suspected infection  Plan:  For CrCl > 50 ml/min, start levaquin 750mg  PO daily  Adrian Saran, PharmD, BCPS Pager [phone] 05/29/2014 8:07 AM

## 2014-05-29 NOTE — Progress Notes (Signed)
Physical Therapy Treatment Patient Details Name: MEHR DEPAOLI MRN: [id number] DOB: 06-Apr-1946 Today's Date: 05/29/2014    History of Present Illness Pt s/p ant direct R THR 05/24/14.  Readmitted with UTI  and Hgb 6.7    PT Comments    Pt still feeling tiered but better.  Assisted OOB to amb in hallway a great distance.  Assisted to bathroom.  Assisted back to bed per pt request.  Follow Up Recommendations  Home health PT     Equipment Recommendations  None recommended by PT    Recommendations for Other Services       Precautions / Restrictions Precautions Precautions: None Restrictions Weight Bearing Restrictions: No Other Position/Activity Restrictions: WBAT    Mobility  Bed Mobility Overal bed mobility: Needs Assistance Bed Mobility: Supine to Sit;Sit to Supine     Supine to sit: Supervision Sit to supine: Supervision   General bed mobility comments: increased time  Transfers Overall transfer level: Needs assistance Equipment used: Rolling walker (2 wheeled) Transfers: Sit to/from Stand Sit to Stand: Supervision         General transfer comment: increased time  Ambulation/Gait Ambulation/Gait assistance: Min guard;Supervision Ambulation Distance (Feet): 285 Feet Assistive device: Rolling walker (2 wheeled) Gait Pattern/deviations: Step-through pattern Gait velocity: WFL   General Gait Details: cues for posture, position from [employer]    Stairs            Wheelchair Mobility    Modified Rankin (Stroke Patients Only)       Balance                                    Cognition                            Exercises      General Comments        Pertinent Vitals/Pain      Home Living                      Prior Function            PT Goals (current goals can now be found in the care plan section) Progress towards PT goals: Progressing toward goals    Frequency  7X/week    PT Plan       Co-evaluation             End of Session Equipment Utilized During Treatment: Gait belt Activity Tolerance: Patient tolerated treatment well Patient left: in bed;with call bell/phone within reach     Time: 1017-1052 PT Time Calculation (min): 35 min  Charges:  $Gait Training: 8-22 mins $Therapeutic Activity: 8-22 mins                    G Codes:      Rica Koyanagi  PTA [facility]  Acute  Rehab Pager      [phone]

## 2014-05-30 DIAGNOSIS — N898 Other specified noninflammatory disorders of vagina: Secondary | ICD-10-CM

## 2014-05-30 LAB — BASIC METABOLIC PANEL
ANION GAP: 10 (ref 5–15)
BUN: 4 mg/dL — AB (ref 6–23)
CHLORIDE: 95 meq/L — AB (ref 96–112)
CO2: 34 mEq/L — ABNORMAL HIGH (ref 19–32)
Calcium: 8.8 mg/dL (ref 8.4–10.5)
Creatinine, Ser: 0.69 mg/dL (ref 0.50–1.10)
GFR calc Af Amer: >90 mL/min (ref >90)
GFR, EST NON AFRICAN AMERICAN: 87 mL/min — AB (ref >90)
Glucose, Bld: 106 mg/dL — ABNORMAL HIGH (ref 70–99)
POTASSIUM: 3.4 meq/L — AB (ref 3.7–5.3)
Sodium: 139 mEq/L (ref 137–147)

## 2014-05-30 LAB — CBC
HCT: 31.5 % — ABNORMAL LOW (ref 36.0–46.0)
HEMOGLOBIN: 10.9 g/dL — AB (ref 12.0–15.0)
MCH: 30.3 pg (ref 26.0–34.0)
MCHC: 34.6 g/dL (ref 30.0–36.0)
MCV: 87.5 fL (ref 78.0–100.0)
PLATELETS: 249 10*3/uL (ref 150–400)
RBC: 3.6 MIL/uL — AB (ref 3.87–5.11)
RDW: 13 % (ref 11.5–15.5)
WBC: 6.8 10*3/uL (ref 4.0–10.5)

## 2014-05-30 MED ORDER — ALBUTEROL SULFATE HFA 108 (90 BASE) MCG/ACT IN AERS: 2.0000 | INHALATION_SPRAY | Freq: Four times a day (QID) | RESPIRATORY_TRACT | Status: DC | PRN

## 2014-05-30 MED ORDER — BISACODYL 10 MG RE SUPP
10.0000 mg | Freq: Once | RECTAL | Status: AC
  Administered 2014-05-30: 10 mg via RECTAL
  Filled 2014-05-30: qty 1

## 2014-05-30 MED ORDER — LEVOFLOXACIN 750 MG PO TABS: 750.0000 mg | ORAL_TABLET | Freq: Every day | ORAL | Status: DC

## 2014-05-30 MED ORDER — POLYETHYLENE GLYCOL 3350 17 G PO PACK: 17.0000 g | PACK | Freq: Two times a day (BID) | ORAL | Status: DC

## 2014-05-30 NOTE — Discharge Instructions (Signed)
Dr. Gaynelle Arabian Total Joint Specialist [facility] [address] [place], [place] [address] [phone]    ANTERIOR APPROACH TOTAL HIP REPLACEMENT POSTOPERATIVE DIRECTIONS   Hip Rehabilitation, Guidelines Following Surgery  The results of a hip operation are greatly improved after range of motion and muscle strengthening exercises. Follow all safety measures which are given to protect your hip. If any of these exercises cause increased pain or swelling in your joint, decrease the amount until you are comfortable again. Then slowly increase the exercises. Call your caregiver if you have problems or questions.  HOME CARE INSTRUCTIONS  Most of the following instructions are designed to prevent the dislocation of your new hip.  Remove items at home which could result in a fall. This includes throw rugs or furniture in walking pathways.  Continue medications as instructed at time of discharge.  You may have some home medications which will be placed on hold until you complete the course of blood thinner medication.  You may start showering once you are discharged home but do not submerge the incision under water. Just pat the incision dry and apply a dry gauze dressing on daily. Do not put on socks or shoes without following the instructions of your caregivers.  Sit on high chairs which makes it easier to stand.  Sit on chairs with arms. Use the chair arms to help push yourself up when arising.  Keep your leg on the side of the operation out in front of you when standing up.  Arrange for the use of a toilet seat elevator so you are not sitting low.    Walk with walker as instructed.  You may resume a sexual relationship in one month or when given the OK by your caregiver.  Use walker as long as suggested by your caregivers.  You may put full weight on your legs and walk as much as is comfortable. Avoid periods of inactivity such as sitting longer than an hour  when not asleep. This helps prevent blood clots.  You may return to work once you are cleared by Engineer, production.  Do not drive a car for 6 weeks or until released by your surgeon.  Do not drive while taking narcotics.  Wear elastic stockings for three weeks following surgery during the day but you may remove then at night.  Make sure you keep all of your appointments after your operation with all of your doctors and caregivers. You should call the office at the above phone number and make an appointment for approximately two weeks after the date of your surgery. Change the dressing daily and reapply a dry dressing each time. Please pick up a stool softener and laxative for home use as long as you are requiring pain medications.  Continue to use ice on the hip for pain and swelling from surgery. You may notice swelling that will progress down to the foot and ankle.  This is normal after surgery.  Elevate the leg when you are not up walking on it.   It is important for you to complete the blood thinner medication as prescribed by your doctor.  Continue to use the breathing machine which will help keep your temperature down.  It is common for your temperature to cycle up and down following surgery, especially at night when you are not up moving around and exerting yourself.  The breathing machine keeps your lungs expanded and your temperature down.  RANGE OF MOTION AND STRENGTHENING EXERCISES  These exercises are designed to help you keep full movement of your hip joint. Follow your caregiver's or physical therapist's instructions. Perform all exercises about fifteen times, three times per day or as directed. Exercise both hips, even if you have had only one joint replacement. These exercises can be done on a training (exercise) mat, on the floor, on a table or on a bed. Use whatever works the best and is most comfortable for you. Use music or television while you are exercising so that the exercises are a  pleasant break in your day. This will make your life better with the exercises acting as a break in routine you can look forward to.  Lying on your back, slowly slide your foot toward your buttocks, raising your knee up off the floor. Then slowly slide your foot back down until your leg is straight again.  Lying on your back spread your legs as far apart as you can without causing discomfort.  Lying on your side, raise your upper leg and foot straight up from the floor as far as is comfortable. Slowly lower the leg and repeat.  Lying on your back, tighten up the muscle in the front of your thigh (quadriceps muscles). You can do this by keeping your leg straight and trying to raise your heel off the floor. This helps strengthen the largest muscle supporting your knee.  Lying on your back, tighten up the muscles of your buttocks both with the legs straight and with the knee bent at a comfortable angle while keeping your heel on the floor.   SKILLED REHAB INSTRUCTIONS: If the patient is transferred to a skilled rehab facility following release from the hospital, a list of the current medications will be sent to the facility for the patient to continue.  When discharged from the skilled rehab facility, please have the facility set up the patient's [place] prior to being released. Also, the skilled facility will be responsible for providing the patient with their medications at time of release from the facility to include their pain medication, the muscle relaxants, and their blood thinner medication. If the patient is still at the rehab facility at time of the two week follow up appointment, the skilled rehab facility will also need to assist the patient in arranging follow up appointment in our office and any transportation needs.  MAKE SURE YOU:  Understand these instructions.  Will watch your condition.  Will get help right away if you are not doing well or get worse.  Pick up  stool softner and laxative for home. Do not submerge incision under water. May shower. Continue to use ice for pain and swelling from surgery. Total Hip Protocol.  Take Xarelto for two and a half more weeks, then discontinue Xarelto. Once the patient has completed the Xarelto, they may resume the 81 mg Aspirin and the Plavix 75 mg daily.

## 2014-05-30 NOTE — Progress Notes (Signed)
   Subjective: Hospital day - 4 Patient reports pain as mild.   Patient seen in rounds with Dr. Wynelle Link this morning.  She is feeling okay.  Patient is well, and has had no acute complaints or problems except for no bowel movement. Patient is ready to go home from an ortho standpoint.  Dr. Wynelle Link wants to see her in follow up next Tuesday in the office.  Objective: Vital signs in last 24 hours: Temp:  [97.5 F (36.4 C)-98.4 F (36.9 C)] 98.4 F (36.9 C) (10/13 0544) Pulse Rate:  [55-61] 60 (10/13 0544) Resp:  [16-20] 20 (10/13 0544) BP: (125-145)/(60-68) 125/60 mmHg (10/13 0544) SpO2:  [91 %-98 %] 95 % (10/13 0544)  Intake/Output from previous day:  Intake/Output Summary (Last 24 hours) at 05/30/14 0744 Last data filed at 05/30/14 0500  Gross per 24 hour  Intake   1200 ml  Output    650 ml  Net    550 ml    Intake/Output this shift:    Labs:  Recent Labs  05/28/14 0500 05/29/14 0440 05/30/14 0425  HGB 9.7* 10.6* 10.9*    Recent Labs  05/29/14 0440 05/30/14 0425  WBC 5.7 6.8  RBC 3.46* 3.60*  HCT 30.4* 31.5*  PLT 219 249    Recent Labs  05/29/14 0440 05/30/14 0425  NA 137 139  K 3.8 3.4*  CL 98 95*  CO2 29 34*  BUN 3* 4*  CREATININE 0.64 0.69  GLUCOSE 121* 106*  CALCIUM 8.5 8.8   No results found for this basename: LABPT, INR,  in the last 72 hours  EXAM: General - Patient is Alert, Appropriate and Disorganized Extremity - Neurovascular intact Sensation intact distally Compartment soft erythema is less today. Incision - clean, dry, no drainage Motor Function - intact, moving foot and toes well on exam.   Assessment/Plan: Past Medical History  Diagnosis Date  . CAD (coronary artery disease)     a. 1997 MI/PCI RCA;  b. 2007 MI/PCI of 100% RCA with Taxus DES x 3 placed, EF 55%;  c. 2010 Cath: stable anatomy;  d. 05/2012 NSTEMI/Cath/PCI: LM nl, LAD 60-20m, D1 20ost, LCX 41m, RCA 40-56m ISR, 60/95d (Treated w/ 2.75x33 Xience Xpedition DES),  PDA 76m (Treated w/ PTCA), EF 60%.  . Depression   . Hyperlipidemia   . Carpal tunnel syndrome on both sides   . Numbness of foot left foot    "never recovered from last back OR, 2009" (05/17/2012)  . GERD (gastroesophageal reflux disease)   . Arthritis     "back; probably knees" (05/17/2012)  . Chronic lower back pain   . Anginal pain   . Myocardial infarction  1997, 2007 , last 2013    x 3   Active Problems:   HCAP (healthcare-associated pneumonia)   Sepsis   Anemia associated with acute blood loss   Tobacco abuse  Estimated body mass index is 28.36 kg/(m^2) as calculated from the following:   Height as of this encounter: 5' 2.6" (1.59 m).   Weight as of this encounter: 71.7 kg (158 lb 1.1 oz). Discharge home Follow up - in 7 days on Next Tuesday October 20th.  Call the office for appointment time. Activity - WBAT right leg Disposition - Home DVT Prophylaxis - Xarelto  Arlee Muslim, PA-C Orthopaedic Surgery 05/30/2014, 7:44 AM

## 2014-05-30 NOTE — Progress Notes (Signed)
D/C instructions reviewed w/ pt and husband, all questions answered. Pt d/c in w/c in stable condition by NT to husband's car. Pt in possession of d/c instructions, scripts, and all personal belongings.

## 2014-05-30 NOTE — Progress Notes (Signed)
Spoke with pt concerning HHPT, pt had no preference. While researching [place], pt was active with [facility]/in house rep with [place].

## 2014-05-30 NOTE — Discharge Summary (Signed)
Physician Discharge Summary  Bianca [name] YVO:[id number] DOB: [birth year] DOA: 05/26/2014  PCP: Donia Ast, FNP  Admit date: 05/26/2014 Discharge date: 05/30/2014  Time spent: 35 minutes  Recommendations for Outpatient Follow-up:  1. Follow up with PCP in 1-2 weeks 2. Follow up with Dr. Maureen Ralphs in 1 week  Recommendations for primary care physician for things to follow:  Recommend to have PFT evaluation  Discharge Diagnoses:  Active Problems:   HCAP (healthcare-associated pneumonia)   Sepsis   Anemia associated with acute blood loss   Tobacco abuse   Vaginal discharge   Discharge Condition: stable  Diet recommendation: regular  Filed Weights   05/27/14 0110  Weight: 71.7 kg (158 lb 1.1 oz)    History of present illness:  Pt is 67 yo female just discharged from the hospital 10/09 after having right hip arthroplasty, presented back to [facility] ED with main concern of persistent pain in the right hip area with nausea and poor oral intake. Pt explains she has also noted cough productive of green sputum, felt hot with fever and though she needed to come back. She denies chest pain but explains has some exertional dyspnea. She denies other abd concerns. In ED, pt noted to be hemodynamically stable, VSS. CXR with no sign of PNA. Hg down to 7.6. Pt given one dose of Maxipime and Vanc for presumptive PNA. TRH asked to admit. Ortho team that discharged pt consulted by Dr. Randal Buba.   Hospital Course:  Sepsis from presumed HCAP  - no clear infectious etiology on CXR, UA unremarkable however she endorses cough with sputum production, febrile to 103 in the ED and tachycardic  - for first 48 hours she was on Vanc and Maxipime for treatment of presumptive HCAP, transitioned to Levofloxacin the day prior to discharge - respiratory status back to normal, patient on room air S/P right hip arthroplasty  - provide analgesia as needed  - PT evaluation  - resume Xarelto per Ortho rec's  -  patient with mild erythematous area right thigh just below the incision, discussed with orthopedic surgery, possibly related to a small local hematoma and not infectious in origin, will follow up with Dr. Maureen Ralphs as an outpatient Acute on chronic blood loss anemia  - post op related  - CBC stable following 2U pRBC transfusion on admission Vaginal discharge  - Diflucan  - has been on and off on antibiotics for tooth infections/sinusitis for the past month  - resolved HTN - continue Metoprolol as per home medical regimen  Tobacco abuse  - patient has 40+ pack year smoking history, decreased breath sounds on exam, suspect underlying COPD.  - advised to quit  - advised to ask PCP for formal PFTs once acute issue resolves    Procedures:  None   Consultations:  Orthopedic surgery   Discharge Exam: Filed Vitals:   05/29/14 2123 05/30/14 0544 05/30/14 0946 05/30/14 1036  BP: 145/60 125/60  117/65  Pulse: 58 60  62  Temp: 98 F (36.7 C) 98.4 F (36.9 C)    TempSrc: Oral     Resp: 18 20  18   Height:      Weight:      SpO2: 96% 95% 91% 93%    General: NAD Cardiovascular: RRR Respiratory: CTA biL  Discharge Instructions  Discharge Instructions   Call MD / Call 911    Complete by:  As directed   If you experience chest pain or shortness of breath, CALL 911 and be transported  to the hospital emergency room.  If you develope a fever above 101 F, pus (white drainage) or increased drainage or redness at the wound, or calf pain, call your surgeon's office.     Change dressing    Complete by:  As directed   You may change your dressing dressing daily with sterile 4 x 4 inch gauze dressing and paper tape.  Do not submerge the incision under water.     Constipation Prevention    Complete by:  As directed   Drink plenty of fluids.  Prune juice may be helpful.  You may use a stool softener, such as Colace (over the counter) 100 mg twice a day.  Use MiraLax (over the counter) for  constipation as needed.     Diet - low sodium heart healthy    Complete by:  As directed      Discharge instructions    Complete by:  As directed   Pick up stool softner and laxative for home. Do not submerge incision under water. May shower. Continue to use ice for pain and swelling from surgery.  Total Hip Protocol.  Take Xarelto for two and a half more weeks, then discontinue Xarelto. Once the patient has completed the Xarelto, they may resume the 81 mg Aspirin and the Plavix 75 mg daily.     Do not sit on low chairs, stoools or toilet seats, as it may be difficult to get up from low surfaces    Complete by:  As directed      Driving restrictions    Complete by:  As directed   No driving until released by the physician.     Increase activity slowly as tolerated    Complete by:  As directed      Lifting restrictions    Complete by:  As directed   No lifting until released by the physician.     Patient may shower    Complete by:  As directed   You may shower without a dressing once there is no drainage.  Do not wash over the wound.  If drainage remains, do not shower until drainage stops.     TED hose    Complete by:  As directed   Use stockings (TED hose) for 3 weeks on both leg(s).  You may remove them at night for sleeping.     Weight bearing as tolerated    Complete by:  As directed   Laterality:  right  Extremity:  Lower            Medication List         albuterol 108 (90 BASE) MCG/ACT inhaler  Commonly known as:  PROVENTIL HFA;VENTOLIN HFA  Inhale 2 puffs into the lungs every 6 (six) hours as needed for wheezing or shortness of breath.     escitalopram 20 MG tablet  Commonly known as:  LEXAPRO  Take 20 mg by mouth every morning.     ferrous sulfate 325 (65 FE) MG tablet  Commonly known as:  FERROUSUL  Take 1 tablet (325 mg total) by mouth 2 (two) times daily with a meal.     fluticasone 50 MCG/ACT nasal spray  Commonly known as:  FLONASE  Place 2 sprays  into both nostrils daily as needed for allergies or rhinitis.     HYDROmorphone 2 MG tablet  Commonly known as:  DILAUDID  Take 1-2 tablets (2-4 mg total) by mouth every 4 (four) hours as needed for moderate  pain or severe pain.     levofloxacin 750 MG tablet  Commonly known as:  LEVAQUIN  Take 1 tablet (750 mg total) by mouth daily.     magic mouthwash w/lidocaine Soln  Take 5 mLs by mouth 4 (four) times daily as needed for mouth pain.     methocarbamol 500 MG tablet  Commonly known as:  ROBAXIN  Take 1 tablet (500 mg total) by mouth every 6 (six) hours as needed for muscle spasms.     metoprolol 50 MG tablet  Commonly known as:  LOPRESSOR  Take 25 mg by mouth 2 (two) times daily.     nitroGLYCERIN 0.4 MG SL tablet  Commonly known as:  NITROSTAT  Place 0.4 mg under the tongue every 5 (five) minutes as needed for chest pain.     pantoprazole 40 MG tablet  Commonly known as:  PROTONIX  Take 40 mg by mouth every evening.     polyethylene glycol packet  Commonly known as:  MIRALAX / GLYCOLAX  Take 17 g by mouth 2 (two) times daily.     rivaroxaban 10 MG Tabs tablet  Commonly known as:  XARELTO  - Take 1 tablet (10 mg total) by mouth daily with breakfast. Take Xarelto for two and a half more weeks, then discontinue Xarelto.  - Once the patient has completed the Xarelto, they may resume the 81 mg Aspirin and the 75 mg Plavix.     simvastatin 80 MG tablet  Commonly known as:  ZOCOR  Take 80 mg by mouth at bedtime.     traMADol 50 MG tablet  Commonly known as:  ULTRAM  Take 1-2 tablets (50-100 mg total) by mouth every 6 (six) hours as needed (mild pain).     zolpidem 10 MG tablet  Commonly known as:  AMBIEN  Take 10 mg by mouth at bedtime as needed for sleep.           Follow-up Information   Follow up with Gearlean Alf, MD. Schedule an appointment as soon as possible for a visit on 06/06/2014. (Call office at [phone] to set up appointment for next Tuesday October  20th.)    Specialty:  Orthopedic Surgery   Contact information:   [address] [place] [address] [phone]       Follow up with CAMPBELL, [place], FNP. Schedule an appointment as soon as possible for a visit in 2 weeks. (please ask your PCP for pulmonary function tests)    Specialty:  Family Medicine   Contact information:   [place] [address] [phone]       The results of significant diagnostics from this hospitalization (including imaging, microbiology, ancillary and laboratory) are listed below for reference.    Significant Diagnostic Studies: Dg Chest 2 View  05/27/2014   CLINICAL DATA:  Persistent nausea and right hip pain, status post recent hip replacement. Initial encounter.  EXAM: CHEST  2 VIEW  COMPARISON:  Chest radiograph from 12/19/2013  FINDINGS: The lungs are hypoexpanded but appear grossly clear. There is no evidence of focal opacification, pleural effusion or pneumothorax.  The heart is normal in size; the mediastinal contour is within normal limits. No acute osseous abnormalities are seen.  IMPRESSION: Lungs hypoexpanded but grossly clear.   Electronically Signed   By: Garald Balding M.D.   On: 05/27/2014 00:32   Dg Hip Complete Right  05/19/2014   CLINICAL DATA:  Preoperative evaluation for right hip replacement, osteoarthritis  of the hip  EXAM: RIGHT HIP - COMPLETE 2+ VIEW  COMPARISON:  None.  FINDINGS: Degenerative changes of the right hip joint are noted with remodeling of the femoral head. Subchondral sclerosis and cyst formation is noted in both the femoral head and superior acetabulum. No acute fracture is seen. Overhanging osteophytes are noted. Lesser osteoarthritic changes are noted in the left hip. The pelvic ring is intact. Degenerative change of the lumbar spine is noted.  IMPRESSION: Osteoarthritic changes right greater than left within the hip joints. No acute abnormality is noted.   Electronically  Signed   By: Inez Catalina M.D.   On: 05/19/2014 15:34   Dg Pelvis Portable  05/24/2014   CLINICAL DATA:  Status post total hip arthroplasty  EXAM: DG C-ARM 1-60 MIN - NRPT [facility]; PORTABLE PELVIS 1-2 VIEWS  COMPARISON:  None.  FINDINGS: Frontal view shows total hip replacement on the right. Prosthetic components appear well seated. No fracture or dislocation. There is a surgical drain in the lateral right hip joint region. There is moderate narrowing of the left hip joint.  IMPRESSION: Total hip arthroplasty components appear well seated on the right. No fracture or dislocation. Moderate narrowing left hip joint.   Electronically Signed   By: Lowella Grip M.D.   On: 05/24/2014 15:22   Dg C-arm 1-60 Min-no Report  05/24/2014   CLINICAL DATA:  Status post total hip arthroplasty  EXAM: DG C-ARM 1-60 MIN - NRPT [facility]; PORTABLE PELVIS 1-2 VIEWS  COMPARISON:  None.  FINDINGS: Frontal view shows total hip replacement on the right. Prosthetic components appear well seated. No fracture or dislocation. There is a surgical drain in the lateral right hip joint region. There is moderate narrowing of the left hip joint.  IMPRESSION: Total hip arthroplasty components appear well seated on the right. No fracture or dislocation. Moderate narrowing left hip joint.   Electronically Signed   By: Lowella Grip M.D.   On: 05/24/2014 15:22   [place] Cm  05/10/2014   CLINICAL DATA:  Persistent sinus pressure in the frontal and maxillary sinuses for 3 weeks. Nasal congestion.  EXAM: CT PARANASAL SINUS WITHOUT CONTRAST  TECHNIQUE: Multidetector CT images of the paranasal sinuses were obtained using the standard protocol without intravenous contrast.  COMPARISON:  CT scan dated 01/13/2008  FINDINGS: There is complete opacification of the right maxillary sinus as well as partial opacification of the anterior ethmoid air cells and complete opacification of the right side of the frontal sinus. In addition, there is  periapical lucency around the roots of tooth number 3.  There is a 2.7 mm osteoma in the midline of the frontal sinus, not significant. The other paranasal sinuses are clear. Mastoid air cells and middle ear cavities are clear.  IMPRESSION: Periapical abscess involving the roots of tooth 3. This appears to extend into the base of the right maxillary sinus. Adjacent opacification of right frontal sinus and right ethmoid sinus anterior ethmoid sinuses may be secondary to the inflammation in the right maxillary sinus. I suspect all of the findings may be secondary to the periapical abscess.   Electronically Signed   By: Rozetta Nunnery M.D.   On: 05/10/2014 10:28    Microbiology: Recent Results (from the past 240 hour(s))  CULTURE, BLOOD (ROUTINE X 2)     Status: None   Collection Time    05/27/14 12:05 AM      Result Value Ref Range Status   Specimen Description BLOOD LEFT  ANTECUBITAL   Final   Special Requests BOTTLES DRAWN AEROBIC AND ANAEROBIC 5ML   Final   Culture  Setup Time     Final   Value: 05/27/2014 03:19     Performed at [facility]   Culture     Final   Value:        BLOOD CULTURE RECEIVED NO GROWTH TO DATE CULTURE WILL BE HELD FOR 5 DAYS BEFORE ISSUING A FINAL NEGATIVE REPORT     Performed at [facility]   Report Status PENDING   Incomplete  CULTURE, BLOOD (ROUTINE X 2)     Status: None   Collection Time    05/27/14 12:10 AM      Result Value Ref Range Status   Specimen Description BLOOD NO DRAW LOCATION   Final   Special Requests BOTTLES DRAWN AEROBIC AND ANAEROBIC 3.5 MLS   Final   Culture  Setup Time     Final   Value: 05/27/2014 03:20     Performed at [facility]   Culture     Final   Value:        BLOOD CULTURE RECEIVED NO GROWTH TO DATE CULTURE WILL BE HELD FOR 5 DAYS BEFORE ISSUING A FINAL NEGATIVE REPORT     Performed at [facility]   Report Status PENDING   Incomplete     Labs: Basic Metabolic Panel:  Recent Labs Lab  05/26/14 0430 05/27/14 0001 05/27/14 0631 05/28/14 0500 05/29/14 0440 05/30/14 0425  NA 137 129* 133* 134* 137 139  K 4.2 3.7 3.4* 3.3* 3.8 3.4*  CL 101 93* 98 98 98 95*  CO2 26  --  24 28 29  34*  GLUCOSE 129* 110* 110* 101* 121* 106*  BUN 6 4* 6 5* 3* 4*  CREATININE 0.61 0.70 0.65 0.65 0.64 0.69  CALCIUM 8.7  --  7.7* 8.2* 8.5 8.8   CBC:  Recent Labs Lab 05/26/14 2347 05/27/14 0001 05/27/14 0631 05/28/14 0500 05/29/14 0440 05/30/14 0425  WBC 7.4  --  5.3 6.0 5.7 6.8  NEUTROABS 6.0  --   --   --   --   --   HGB 7.6* 7.8* 6.7* 9.7* 10.6* 10.9*  HCT 22.1* 23.0* 19.0* 27.8* 30.4* 31.5*  MCV 88.8  --  89.6 87.1 87.9 87.5  PLT 223  --  178 184 219 249   BNP: BNP (last 3 results)  Recent Labs  12/19/13 0743  PROBNP 353.3*   CBG:  Recent Labs Lab 05/27/14 0400  GLUCAP 123*   Signed:  Judythe Postema  [facility] 05/30/2014, 6:54 PM

## 2014-06-02 LAB — CULTURE, BLOOD (ROUTINE X 2)
CULTURE: NO GROWTH
Culture: NO GROWTH

## 2014-06-12 ENCOUNTER — Telehealth: Payer: Self-pay | Admitting: Family

## 2014-06-12 NOTE — Telephone Encounter (Signed)
Schedule pt for Wednesday or Thursday. You may use to 65min slots please

## 2014-06-12 NOTE — Telephone Encounter (Signed)
Pt dcd from hospital 10/14 and is requesting OV to discuss and some other issues pt's declined to elaborate. Would like appt asap. pls advise on when/where to schedule

## 2014-06-12 NOTE — Telephone Encounter (Signed)
Pt has therapy at 11:30, so needs early appt to get back. Blocked a 15 min to make up time for pt.

## 2014-06-14 ENCOUNTER — Ambulatory Visit (INDEPENDENT_AMBULATORY_CARE_PROVIDER_SITE_OTHER): Payer: Managed Care, Other (non HMO) | Admitting: Family

## 2014-06-14 ENCOUNTER — Encounter: Payer: Self-pay | Admitting: Family

## 2014-06-14 VITALS — BP 116/70 | HR 65 | Ht 62.5 in | Wt 153.1 lb

## 2014-06-14 DIAGNOSIS — A499 Bacterial infection, unspecified: Secondary | ICD-10-CM

## 2014-06-14 DIAGNOSIS — Z96641 Presence of right artificial hip joint: Secondary | ICD-10-CM

## 2014-06-14 DIAGNOSIS — N390 Urinary tract infection, site not specified: Secondary | ICD-10-CM

## 2014-06-14 DIAGNOSIS — F32A Depression, unspecified: Secondary | ICD-10-CM

## 2014-06-14 DIAGNOSIS — Z9660 Presence of unspecified orthopedic joint implant: Secondary | ICD-10-CM

## 2014-06-14 DIAGNOSIS — Z23 Encounter for immunization: Secondary | ICD-10-CM

## 2014-06-14 DIAGNOSIS — D62 Acute posthemorrhagic anemia: Secondary | ICD-10-CM

## 2014-06-14 DIAGNOSIS — F329 Major depressive disorder, single episode, unspecified: Secondary | ICD-10-CM

## 2014-06-14 LAB — POCT URINALYSIS DIPSTICK
Bilirubin, UA: NEGATIVE
Glucose, UA: NEGATIVE
Ketones, UA: NEGATIVE
LEUKOCYTES UA: NEGATIVE
NITRITE UA: NEGATIVE
PH UA: 5.5
PROTEIN UA: NEGATIVE
Spec Grav, UA: 1.015
Urobilinogen, UA: 0.2

## 2014-06-14 LAB — CBC WITH DIFFERENTIAL/PLATELET
Basophils Absolute: 0 10*3/uL (ref 0.0–0.1)
Basophils Relative: 0.6 % (ref 0.0–3.0)
EOS ABS: 0.2 10*3/uL (ref 0.0–0.7)
Eosinophils Relative: 2.1 % (ref 0.0–5.0)
HCT: 32.1 % — ABNORMAL LOW (ref 36.0–46.0)
Hemoglobin: 10.8 g/dL — ABNORMAL LOW (ref 12.0–15.0)
LYMPHS PCT: 26.3 % (ref 12.0–46.0)
Lymphs Abs: 2 10*3/uL (ref 0.7–4.0)
MCHC: 33.6 g/dL (ref 30.0–36.0)
MCV: 89.6 fl (ref 78.0–100.0)
MONOS PCT: 12.2 % — AB (ref 3.0–12.0)
Monocytes Absolute: 0.9 10*3/uL (ref 0.1–1.0)
Neutro Abs: 4.5 10*3/uL (ref 1.4–7.7)
Neutrophils Relative %: 58.8 % (ref 43.0–77.0)
PLATELETS: 348 10*3/uL (ref 150.0–400.0)
RBC: 3.59 Mil/uL — ABNORMAL LOW (ref 3.87–5.11)
RDW: 13.9 % (ref 11.5–15.5)
WBC: 7.6 10*3/uL (ref 4.0–10.5)

## 2014-06-14 MED ORDER — BUPROPION HCL ER (XL) 150 MG PO TB24: 150.0000 mg | ORAL_TABLET | Freq: Every day | ORAL | Status: DC

## 2014-06-14 NOTE — Progress Notes (Signed)
Subjective:    Patient ID: Bianca [name], female    DOB: 08/26/1945, 68 y.o.   MRN: [id number]  HPI 68 year old white female, nonsmoker is in today as a hospital follow-up. She presented to the hospital on 05/26/2014 after developing a fever postoperatively. On 05/24/2014 she underwent total right hip replacement. She was found to have a urinary tract infection. Had a CT scan after complaints of a sinus infection that also revealed 3 abscess teeth issues had extracted. Continues to have some burning with urination and is currently on cephalexin. Denies any frequency or urgency. Reports overall doing much better today. She is undergoing physical therapy after hip replacement.  Patient reports feeling more down and depressed after being in and out of the hospital over the last several months. She currently takes Lexapro 20 mg once daily. Has feelings of helplessness but denies any hopelessness. No thoughts of death or dying. Review of Systems  Constitutional: Negative.   HENT: Negative.   Respiratory: Negative.   Cardiovascular: Negative.   Gastrointestinal: Negative.   Endocrine: Negative.   Genitourinary: Negative.   Musculoskeletal: Negative.   Skin: Negative.   Allergic/Immunologic: Negative.   Neurological: Negative.   Hematological: Negative.   Psychiatric/Behavioral: Negative.    Past Medical History  Diagnosis Date  . CAD (coronary artery disease)     a. 1997 MI/PCI RCA;  b. 2007 MI/PCI of 100% RCA with Taxus DES x 3 placed, EF 55%;  c. 2010 Cath: stable anatomy;  d. 05/2012 NSTEMI/Cath/PCI: LM nl, LAD 60-61m, D1 20ost, LCX 33m, RCA 40-60m ISR, 60/95d (Treated w/ 2.75x33 Xience Xpedition DES), PDA 80m (Treated w/ PTCA), EF 60%.  . Depression   . Hyperlipidemia   . Carpal tunnel syndrome on both sides   . Numbness of foot left foot    "never recovered from last back OR, 2009" (05/17/2012)  . GERD (gastroesophageal reflux disease)   . Arthritis     "back; probably knees"  (05/17/2012)  . Chronic lower back pain   . Anginal pain   . Myocardial infarction  1997, 2007 , last 2013    x 3    History   Social History  . Marital Status: Married    Spouse Name: N/A    Number of Children: N/A  . Years of Education: N/A   Occupational History  . Not on file.   Social History Main Topics  . Smoking status: Current Every Day Smoker -- 0.50 packs/day for 50 years    Types: Cigarettes  . Smokeless tobacco: Never Used  . Alcohol Use: No  . Drug Use: No  . Sexual Activity: Not Currently   Other Topics Concern  . Not on file   Social History Narrative  . No narrative on file    Past Surgical History  Procedure Laterality Date  . Cesarean section  1990  . Ankle surgery  years ago    left; "had to take out a floater"  . Posterior fusion lumbar spine  1962; 2009  . Coronary angioplasty with stent placement  1997; 2007, 2013    "2 + 2; + 1= total of 5  . Total hip arthroplasty Right 05/24/2014    Procedure: RIGHT TOTAL HIP ARTHROPLASTY ANTERIOR APPROACH;  Surgeon: Gearlean Alf, MD;  Location: [facility];  Service: Orthopedics;  Laterality: Right;    Family History  Problem Relation Age of Onset  . Coronary artery disease Mother   . Diabetes Mother   . Hypertension Mother   .  Dementia Father   . Sudden death Brother     suicide  . Cancer Paternal Grandfather     esophageal    Allergies  Allergen Reactions  . Oxycodone Hcl Itching and Other (See Comments)    confusion  . Penicillins Rash  . Macrobid [Nitrofurantoin Monohyd Macro] Nausea And Vomiting  . Sulfa Antibiotics Nausea Only    Current Outpatient Prescriptions on File Prior to Visit  Medication Sig Dispense Refill  . albuterol (PROVENTIL HFA;VENTOLIN HFA) 108 (90 BASE) MCG/ACT inhaler Inhale 2 puffs into the lungs every 6 (six) hours as needed for wheezing or shortness of breath.  1 Inhaler  2  . Alum & Mag Hydroxide-Simeth (MAGIC MOUTHWASH W/LIDOCAINE) SOLN Take 5 mLs by mouth 4  (four) times daily as needed for mouth pain.  120 mL  0  . escitalopram (LEXAPRO) 20 MG tablet Take 20 mg by mouth every morning.       . ferrous sulfate (FERROUSUL) 325 (65 FE) MG tablet Take 1 tablet (325 mg total) by mouth 2 (two) times daily with a meal.  42 tablet  0  . fluticasone (FLONASE) 50 MCG/ACT nasal spray Place 2 sprays into both nostrils daily as needed for allergies or rhinitis.      Marland Kitchen HYDROmorphone (DILAUDID) 2 MG tablet Take 1-2 tablets (2-4 mg total) by mouth every 4 (four) hours as needed for moderate pain or severe pain.  80 tablet  0  . levofloxacin (LEVAQUIN) 750 MG tablet Take 1 tablet (750 mg total) by mouth daily.  4 tablet  0  . methocarbamol (ROBAXIN) 500 MG tablet Take 1 tablet (500 mg total) by mouth every 6 (six) hours as needed for muscle spasms.  80 tablet  0  . metoprolol (LOPRESSOR) 50 MG tablet Take 25 mg by mouth 2 (two) times daily.      . nitroGLYCERIN (NITROSTAT) 0.4 MG SL tablet Place 0.4 mg under the tongue every 5 (five) minutes as needed for chest pain.      . pantoprazole (PROTONIX) 40 MG tablet Take 40 mg by mouth every evening.       . polyethylene glycol (MIRALAX / GLYCOLAX) packet Take 17 g by mouth 2 (two) times daily.  30 each  2  . rivaroxaban (XARELTO) 10 MG TABS tablet Take 1 tablet (10 mg total) by mouth daily with breakfast. Take Xarelto for two and a half more weeks, then discontinue Xarelto. Once the patient has completed the Xarelto, they may resume the 81 mg Aspirin and the 75 mg Plavix.  19 tablet  0  . simvastatin (ZOCOR) 80 MG tablet Take 80 mg by mouth at bedtime.      . traMADol (ULTRAM) 50 MG tablet Take 1-2 tablets (50-100 mg total) by mouth every 6 (six) hours as needed (mild pain).  80 tablet  1  . zolpidem (AMBIEN) 10 MG tablet Take 10 mg by mouth at bedtime as needed for sleep.       No current facility-administered medications on file prior to visit.    BP 116/70  Pulse 65  Ht 5' 2.5" (1.588 m)  Wt 153 lb 1.6 oz (69.446  kg)  BMI 27.54 kg/m2chart     Objective:   Physical Exam  Constitutional: She is oriented to person, place, and time. She appears well-developed and well-nourished.  HENT:  Right Ear: External ear normal.  Left Ear: External ear normal.  Nose: Nose normal.  Mouth/Throat: Oropharynx is clear and moist.  Neck: Normal range  of motion. Neck supple.  Cardiovascular: Normal rate and normal heart sounds.   Pulmonary/Chest: Effort normal and breath sounds normal.  Abdominal: Soft. Bowel sounds are normal.  Musculoskeletal: Normal range of motion.  Neurological: She is alert and oriented to person, place, and time.  Skin: Skin is warm and dry.  Psychiatric: She has a normal mood and affect.          Assessment & Plan:  Bianca [name] was seen today for hospital f/u.  Diagnoses and associated orders for this visit:  Acute blood loss anemia - CBC with Differential  Encounter for immunization  Status post right hip replacement  Depression  Bacterial urinary infection - POC Urinalysis Dipstick  Other Orders - buPROPion (WELLBUTRIN XL) 150 MG 24 hr tablet; Take 1 tablet (150 mg total) by mouth daily.    Continue current medications. Recheck when necessary. Continue physical therapy. Follow-up as scheduled and sooner as needed.

## 2014-06-14 NOTE — Progress Notes (Signed)
Pre visit review using our clinic review tool, if applicable. No additional management support is needed unless otherwise documented below in the visit note. 

## 2014-06-14 NOTE — Patient Instructions (Signed)

## 2014-06-15 ENCOUNTER — Telehealth: Payer: Self-pay | Admitting: Family

## 2014-06-15 DIAGNOSIS — R319 Hematuria, unspecified: Secondary | ICD-10-CM

## 2014-06-15 NOTE — Telephone Encounter (Addendum)
Pt would like UA results. Pt also would like to know if she can take together escitalopram 20 mg and bupropion 150 mg

## 2014-06-15 NOTE — Telephone Encounter (Signed)
Pt aware urine dip showed hematuria. Pt reports burning with urination. When asked if she has ever had a work up for the hematuria, pt says no. Advised that looking back, her urine dips seems to show some amount of blood consistently with cultures showing no growth. I also advised pt that Abby Potash will likely refer her to urology.  Pt aware she should be taking both Lexapro and Wellbutrin  Referral placed

## 2014-06-22 ENCOUNTER — Telehealth: Payer: Self-pay | Admitting: Family

## 2014-06-22 NOTE — Telephone Encounter (Signed)
Pt states she is still having burning.. The walk-in clinic she went to prior to seeing padonda prescribed cephalexin and it did not work.  Pt's appt w/ urology isn't for a cpuple of weeks. Pt would like to know if she could get rx bc she is still having problems [facility]  Pt thought she remembers tamesha saying something about a rx, but not sure.

## 2014-06-23 MED ORDER — CIPROFLOXACIN HCL 250 MG PO TABS: 250.0000 mg | ORAL_TABLET | Freq: Two times a day (BID) | ORAL | Status: DC

## 2014-06-23 NOTE — Telephone Encounter (Signed)
Rx sent to pharmacy and left message to advise pt

## 2014-06-25 ENCOUNTER — Other Ambulatory Visit: Payer: Self-pay | Admitting: Family

## 2014-07-07 ENCOUNTER — Other Ambulatory Visit: Payer: Self-pay | Admitting: Family

## 2014-07-11 ENCOUNTER — Other Ambulatory Visit: Payer: Self-pay | Admitting: Family

## 2014-07-27 ENCOUNTER — Encounter (HOSPITAL_COMMUNITY): Payer: Self-pay | Admitting: Cardiovascular Disease

## 2014-08-02 ENCOUNTER — Ambulatory Visit (INDEPENDENT_AMBULATORY_CARE_PROVIDER_SITE_OTHER): Payer: Managed Care, Other (non HMO)

## 2014-08-02 DIAGNOSIS — E538 Deficiency of other specified B group vitamins: Secondary | ICD-10-CM

## 2014-08-02 MED ORDER — CYANOCOBALAMIN 1000 MCG/ML IJ SOLN
1000.0000 ug | Freq: Once | INTRAMUSCULAR | Status: AC
  Administered 2014-08-02: 1000 ug via INTRAMUSCULAR

## 2014-08-05 ENCOUNTER — Other Ambulatory Visit: Payer: Self-pay | Admitting: Internal Medicine

## 2014-08-05 ENCOUNTER — Other Ambulatory Visit: Payer: Self-pay | Admitting: Family

## 2014-08-15 ENCOUNTER — Emergency Department (HOSPITAL_COMMUNITY)
Admission: EM | Admit: 2014-08-15 | Discharge: 2014-08-16 | Disposition: A | Discharge status: Home / Self Care | Payer: Managed Care, Other (non HMO) | Attending: Emergency Medicine | Admitting: Emergency Medicine

## 2014-08-15 ENCOUNTER — Emergency Department (HOSPITAL_COMMUNITY): Payer: Managed Care, Other (non HMO)

## 2014-08-15 ENCOUNTER — Encounter (HOSPITAL_COMMUNITY): Payer: Self-pay | Admitting: Emergency Medicine

## 2014-08-15 DIAGNOSIS — Z880 Allergy status to penicillin: Secondary | ICD-10-CM | POA: Diagnosis not present

## 2014-08-15 DIAGNOSIS — Z9861 Coronary angioplasty status: Secondary | ICD-10-CM | POA: Insufficient documentation

## 2014-08-15 DIAGNOSIS — Z7902 Long term (current) use of antithrombotics/antiplatelets: Secondary | ICD-10-CM | POA: Diagnosis not present

## 2014-08-15 DIAGNOSIS — J069 Acute upper respiratory infection, unspecified: Secondary | ICD-10-CM | POA: Insufficient documentation

## 2014-08-15 DIAGNOSIS — R52 Pain, unspecified: Secondary | ICD-10-CM

## 2014-08-15 DIAGNOSIS — F22 Delusional disorders: Secondary | ICD-10-CM

## 2014-08-15 DIAGNOSIS — R079 Chest pain, unspecified: Secondary | ICD-10-CM

## 2014-08-15 DIAGNOSIS — K219 Gastro-esophageal reflux disease without esophagitis: Secondary | ICD-10-CM | POA: Insufficient documentation

## 2014-08-15 DIAGNOSIS — G8929 Other chronic pain: Secondary | ICD-10-CM | POA: Insufficient documentation

## 2014-08-15 DIAGNOSIS — E785 Hyperlipidemia, unspecified: Secondary | ICD-10-CM | POA: Diagnosis not present

## 2014-08-15 DIAGNOSIS — I2510 Atherosclerotic heart disease of native coronary artery without angina pectoris: Secondary | ICD-10-CM | POA: Diagnosis not present

## 2014-08-15 DIAGNOSIS — F329 Major depressive disorder, single episode, unspecified: Secondary | ICD-10-CM | POA: Diagnosis not present

## 2014-08-15 DIAGNOSIS — Z720 Tobacco use: Secondary | ICD-10-CM | POA: Insufficient documentation

## 2014-08-15 DIAGNOSIS — Z8739 Personal history of other diseases of the musculoskeletal system and connective tissue: Secondary | ICD-10-CM | POA: Diagnosis not present

## 2014-08-15 DIAGNOSIS — Z9889 Other specified postprocedural states: Secondary | ICD-10-CM | POA: Insufficient documentation

## 2014-08-15 DIAGNOSIS — I252 Old myocardial infarction: Secondary | ICD-10-CM | POA: Diagnosis not present

## 2014-08-15 DIAGNOSIS — Z79899 Other long term (current) drug therapy: Secondary | ICD-10-CM | POA: Insufficient documentation

## 2014-08-15 LAB — BASIC METABOLIC PANEL
ANION GAP: 5 (ref 5–15)
BUN: 8 mg/dL (ref 6–23)
CHLORIDE: 105 meq/L (ref 96–112)
CO2: 28 mmol/L (ref 19–32)
Calcium: 9.3 mg/dL (ref 8.4–10.5)
Creatinine, Ser: 0.67 mg/dL (ref 0.50–1.10)
GFR, EST NON AFRICAN AMERICAN: 88 mL/min — AB (ref >90)
Glucose, Bld: 92 mg/dL (ref 70–99)
POTASSIUM: 4.1 mmol/L (ref 3.5–5.1)
Sodium: 138 mmol/L (ref 135–145)

## 2014-08-15 LAB — CBC
HEMATOCRIT: 40.8 % (ref 36.0–46.0)
Hemoglobin: 13.2 g/dL (ref 12.0–15.0)
MCH: 29.7 pg (ref 26.0–34.0)
MCHC: 32.4 g/dL (ref 30.0–36.0)
MCV: 91.7 fL (ref 78.0–100.0)
PLATELETS: 265 10*3/uL (ref 150–400)
RBC: 4.45 MIL/uL (ref 3.87–5.11)
RDW: 13.2 % (ref 11.5–15.5)
WBC: 5.4 10*3/uL (ref 4.0–10.5)

## 2014-08-15 LAB — I-STAT TROPONIN, ED: TROPONIN I, POC: 0 ng/mL (ref 0.00–0.08)

## 2014-08-15 NOTE — ED Provider Notes (Signed)
CSN: [id number]     Arrival date & time 08/15/14  1808 History   First MD Initiated Contact with Patient 08/15/14 2312     Chief Complaint  Patient presents with  . Chest Pain  . URI     (Consider location/radiation/quality/duration/timing/severity/associated sxs/prior Treatment) Patient is a 68 y.o. female presenting with chest pain. The history is provided by the patient.  Chest Pain Pain location:  Substernal area Pain quality: dull   Pain radiates to:  Does not radiate Pain radiates to the back: no   Pain severity:  Moderate Onset quality:  Gradual Duration:  4 days Timing:  Constant Progression:  Unchanged Chronicity:  New Context: not raising an arm and no trauma   Relieved by:  Nothing Worsened by:  Nothing tried Ineffective treatments:  None tried Associated symptoms: cough   Associated symptoms: no diaphoresis, no palpitations, no shortness of breath and not vomiting   Associated symptoms comment:  Nasal congestion and URI Risk factors: not pregnant   URI and ongoing pain told to come to the ED  Past Medical History  Diagnosis Date  . CAD (coronary artery disease)     a. 1997 MI/PCI RCA;  b. 2007 MI/PCI of 100% RCA with Taxus DES x 3 placed, EF 55%;  c. 2010 Cath: stable anatomy;  d. 05/2012 NSTEMI/Cath/PCI: LM nl, LAD 60-20m, D1 20ost, LCX 61m, RCA 40-9m ISR, 60/95d (Treated w/ 2.75x33 Xience Xpedition DES), PDA 74m (Treated w/ PTCA), EF 60%.  . Depression   . Hyperlipidemia   . Carpal tunnel syndrome on both sides   . Numbness of foot left foot    "never recovered from last back OR, 2009" (05/17/2012)  . GERD (gastroesophageal reflux disease)   . Arthritis     "back; probably knees" (05/17/2012)  . Chronic lower back pain   . Anginal pain   . Myocardial infarction  1997, 2007 , last 2013    x 3   Past Surgical History  Procedure Laterality Date  . Cesarean section  1990  . Ankle surgery  years ago    left; "had to take out a floater"  . Posterior  fusion lumbar spine  1962; 2009  . Coronary angioplasty with stent placement  1997; 2007, 2013    "2 + 2; + 1= total of 5  . 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 Left heart catheterization with coronary angiogram N/A 05/18/2012    Procedure: LEFT HEART CATHETERIZATION WITH CORONARY ANGIOGRAM;  Surgeon: Wellington Hampshire, MD;  Location: [facility];  Service: Cardiovascular;  Laterality: N/A;   Family History  Problem Relation Age of Onset  . Coronary artery disease Mother   . Diabetes Mother   . Hypertension Mother   . Dementia Father   . Sudden death Brother     suicide  . Cancer Paternal Grandfather     esophageal   History  Substance Use Topics  . Smoking status: Current Every Day Smoker -- 0.50 packs/day for 50 years    Types: Cigarettes  . Smokeless tobacco: Never Used  . Alcohol Use: No   OB History    No data available     Review of Systems  Constitutional: Negative for diaphoresis.  HENT: Positive for congestion. Negative for ear pain.   Respiratory: Positive for cough. Negative for shortness of breath and  wheezing.   Cardiovascular: Positive for chest pain. Negative for palpitations and leg swelling.  Gastrointestinal: Negative for vomiting.      Allergies  Oxycodone hcl; Penicillins; Macrobid; and Sulfa antibiotics  Home Medications   Prior to Admission medications   Medication Sig Start Date End Date Taking? Authorizing Provider  buPROPion (WELLBUTRIN XL) 150 MG 24 hr tablet Take 1 tablet (150 mg total) by mouth daily. 06/14/14  Yes Timoteo Gaul, FNP  clopidogrel (PLAVIX) 75 MG tablet TAKE 1 TABLET BY MOUTH ONCE DAILY 08/07/14  Yes Fay Records, MD  cyanocobalamin (,VITAMIN B-12,) 1000 MCG/ML injection Inject 1,000 mcg into the muscle every 30 (thirty) days.   Yes Historical Provider, MD   escitalopram (LEXAPRO) 20 MG tablet TAKE 1 TABLET BY MOUTH EVERY DAY 06/26/14  Yes Timoteo Gaul, FNP  ezetimibe-simvastatin (VYTORIN) 10-80 MG per tablet Take 1 tablet by mouth daily.   Yes Historical Provider, MD  metoprolol (LOPRESSOR) 50 MG tablet TAKE 1/2 TABLETS (25 MG TOTAL) BY MOUTH 2 (TWO) TIMES DAILY. 08/05/14  Yes Timoteo Gaul, FNP  nitroGLYCERIN (NITROSTAT) 0.4 MG SL tablet Place 0.4 mg under the tongue every 5 (five) minutes as needed for chest pain.   Yes Historical Provider, MD  pantoprazole (PROTONIX) 40 MG tablet TAKE 1 TABLET (40 MG TOTAL) BY MOUTH DAILY. 08/07/14  Yes Fay Records, MD  traMADol (ULTRAM) 50 MG tablet Take 1-2 tablets (50-100 mg total) by mouth every 6 (six) hours as needed (mild pain). 05/26/14  Yes Arlee Muslim, PA-C  zolpidem (AMBIEN) 10 MG tablet TAKE 1 TABLET BY MOUTH AT BEDTIME AS NEEDED Patient taking differently: TAKE 1 TABLET BY MOUTH AT BEDTIME 07/07/14  Yes Timoteo Gaul, FNP  albuterol (PROVENTIL HFA;VENTOLIN HFA) 108 (90 BASE) MCG/ACT inhaler Inhale 2 puffs into the lungs every 6 (six) hours as needed for wheezing or shortness of breath. Patient not taking: Reported on 08/15/2014 05/30/14   Caren Griffins, MD  Alum & Mag Hydroxide-Simeth (MAGIC MOUTHWASH W/LIDOCAINE) SOLN Take 5 mLs by mouth 4 (four) times daily as needed for mouth pain. Patient not taking: Reported on 08/15/2014 03/14/14   Timoteo Gaul, FNP  ciprofloxacin (CIPRO) 250 MG tablet Take 1 tablet (250 mg total) by mouth 2 (two) times daily. Patient not taking: Reported on 08/15/2014 06/23/14   Timoteo Gaul, FNP  ferrous sulfate (FERROUSUL) 325 (65 FE) MG tablet Take 1 tablet (325 mg total) by mouth 2 (two) times daily with a meal. Patient not taking: Reported on 08/15/2014 05/26/14   Arlee Muslim, PA-C  HYDROmorphone (DILAUDID) 2 MG tablet Take 1-2 tablets (2-4 mg total) by mouth every 4 (four) hours as needed for moderate pain or severe pain. Patient not taking:  Reported on 08/15/2014 05/26/14   Arlee Muslim, PA-C  levofloxacin (LEVAQUIN) 750 MG tablet Take 1 tablet (750 mg total) by mouth daily. Patient not taking: Reported on 08/15/2014 05/30/14   Caren Griffins, MD  methocarbamol (ROBAXIN) 500 MG tablet Take 1 tablet (500 mg total) by mouth every 6 (six) hours as needed for muscle spasms. Patient not taking: Reported on 08/15/2014 05/26/14   Arlee Muslim, PA-C  polyethylene glycol [facility] / Floria Raveling) packet Take 17 g by mouth 2 (two) times daily. Patient not taking: Reported on 08/15/2014 05/30/14   Caren Griffins, MD  rivaroxaban (XARELTO) 10 MG TABS tablet Take 1 tablet (10 mg total) by mouth daily with breakfast. Take Xarelto for two and a half more weeks, then discontinue  Xarelto. Once the patient has completed the Xarelto, they may resume the 81 mg Aspirin and the 75 mg Plavix. Patient not taking: Reported on 08/15/2014 05/26/14   Arlee Muslim, PA-C  simvastatin (ZOCOR) 80 MG tablet TAKE 1 TABLET BY MOUTH AT BEDTIME Patient not taking: Reported on 08/15/2014 07/12/14   Timoteo Gaul, FNP   BP 147/73 mmHg  Pulse 56  Temp(Src) 97.6 F (36.4 C) (Oral)  Resp 16  SpO2 100% Physical Exam  Constitutional: She is oriented to person, place, and time. She appears well-developed and well-nourished. No distress.  HENT:  Head: Normocephalic and atraumatic.  Mouth/Throat: Oropharynx is clear and moist. No oropharyngeal exudate.  Eyes: Conjunctivae and EOM are normal. Pupils are equal, round, and reactive to light.  Neck: Normal range of motion. Neck supple.  Cardiovascular: Normal rate, regular rhythm and intact distal pulses.   Pulmonary/Chest: Effort normal and breath sounds normal. No respiratory distress. She has no wheezes. She has no rales. She exhibits no tenderness.  Abdominal: Soft. Bowel sounds are normal. There is no tenderness. There is no rebound and no guarding.  Musculoskeletal: Normal range of motion. She exhibits no edema or  tenderness.  Neurological: She is alert and oriented to person, place, and time.  Skin: Skin is warm and dry. She is not diaphoretic.  Psychiatric: She has a normal mood and affect.    ED Course  Procedures (including critical care time) Labs Review Labs Reviewed  BASIC METABOLIC PANEL - Abnormal; Notable for the following:    GFR calc non Af Amer 88 (*)    All other components within normal limits  CBC  I-STAT TROPOININ, ED    Imaging Review Dg Chest 2 View  08/15/2014   CLINICAL DATA:  Worsening cough, chest pain x3 weeks  EXAM: CHEST  2 VIEW  COMPARISON:  05/26/2014  FINDINGS: Lungs are clear.  No pleural effusion or pneumothorax.  The heart is normal in size.  Coronary stent.  Visualized osseous structures are within normal limits.  IMPRESSION: No evidence of acute cardiopulmonary disease.   Electronically Signed   By: Julian Hy M.D.   On: 08/15/2014 19:10     EKG Interpretation   Date/Time:  Tuesday August 15 2014 18:16:52 EST Ventricular Rate:  54 PR Interval:  163 QRS Duration: 92 QT Interval:  449 QTC Calculation: 425 R Axis:   74 Text Interpretation:  Sinus rhythm Consider left ventricular hypertrophy  Abnrm T, consider ischemia, anterolateral lds Confirmed by COOK  MD, BRIAN  [phone]) on 08/15/2014 6:35:04 PM      MDM   Final diagnoses:  "Walking corpse" syndrome  Chest pain  Pain is worse with coughing.  Highly doubt ACS.  Treatng URI symptomatically.    220 case d/w Dr. Claiborne Billings of cardiology.  With ongoing symptoms for 4 days troponin should be elevated EKG changes likely old as none since May.  Took patient's name.  Patient to call in am to be seen in office related to change in the EKG   Informed patient of discussion with cardiology and patient will call cardiology in am to be seen.  Feeling better except nasal congestion   Erasmo Vertz K Izak Anding-Rasch, MD 08/16/14 [phone]

## 2014-08-15 NOTE — ED Notes (Signed)
Per pt, states cold symptoms and cough for over a week-started having chest pain today related to cough-history of 3 MIs so PCP told her to come to ED for eval

## 2014-08-16 ENCOUNTER — Encounter (HOSPITAL_COMMUNITY): Payer: Self-pay

## 2014-08-16 ENCOUNTER — Emergency Department (HOSPITAL_COMMUNITY): Payer: Managed Care, Other (non HMO)

## 2014-08-16 ENCOUNTER — Telehealth: Payer: Self-pay | Admitting: Family

## 2014-08-16 MED ORDER — KETOROLAC TROMETHAMINE 30 MG/ML IJ SOLN
30.0000 mg | Freq: Once | INTRAMUSCULAR | Status: AC
  Administered 2014-08-16: 30 mg via INTRAVENOUS
  Filled 2014-08-16: qty 1

## 2014-08-16 MED ORDER — FLUTICASONE PROPIONATE 50 MCG/ACT NA SUSP: 2.0000 | Freq: Every day | NASAL | Status: DC

## 2014-08-16 MED ORDER — GUAIFENESIN 100 MG/5ML PO SOLN
5.0000 mL | Freq: Once | ORAL | Status: AC
  Administered 2014-08-16: 100 mg via ORAL
  Filled 2014-08-16: qty 5

## 2014-08-16 MED ORDER — BENZONATATE 100 MG PO CAPS
200.0000 mg | ORAL_CAPSULE | Freq: Once | ORAL | Status: AC
  Administered 2014-08-16: 200 mg via ORAL
  Filled 2014-08-16: qty 2

## 2014-08-16 MED ORDER — GUAIFENESIN 200 MG PO TABS: 200.0000 mg | ORAL_TABLET | ORAL | Status: DC | PRN

## 2014-08-16 MED ORDER — DESLORATADINE 5 MG PO TABS: 5.0000 mg | ORAL_TABLET | Freq: Every day | ORAL | Status: DC

## 2014-08-16 MED ORDER — IOHEXOL 350 MG/ML SOLN
100.0000 mL | Freq: Once | INTRAVENOUS | Status: AC | PRN
  Administered 2014-08-16: 100 mL via INTRAVENOUS

## 2014-08-16 MED ORDER — NAPROXEN 375 MG PO TABS: 375.0000 mg | ORAL_TABLET | Freq: Two times a day (BID) | ORAL | Status: DC

## 2014-08-16 NOTE — Telephone Encounter (Signed)
Please advise 

## 2014-08-16 NOTE — ED Notes (Signed)
Patient transported to CT 

## 2014-08-16 NOTE — Telephone Encounter (Signed)
Seen in the ED. Follow-up post hospital as available. OTC Flonase as needed for congestion.

## 2014-08-16 NOTE — Telephone Encounter (Signed)
[facility] - Client [place] Patient Name: Bianca [name] Gender: Female DOB: 27-Jan-1946 Age: 68 Y 51 M 24 D Return Phone Number: [phone] (Primary) Address: [address] City/State/Zip: [place] [address] Client [facility] - Client Client Site [place] - Day Physician Roxy Cedar Contact Type Call Call Type Triage / Clinical Relationship To Patient Self Return Phone Number [phone] (Primary) Chief Complaint Cough Initial Comment Caller states she is having some coughing and stuffy. She is congested in her chest too. PreDisposition Call Doctor Nurse Assessment Nurse: Malva Cogan, RN, Juliann Pulse Date/Time Eilene Ghazi Time): 08/15/2014 4:55:25 PM Confirm and document reason for call. If symptomatic, describe symptoms. ---Caller states that she had onset of stuffy nose approx 1 wk ago, had onset of increased chest congestion 2 days ago, onset of coughing yesterday, & has been having difficulty breathing with exertion a few days. No fever. No problems with breathing now. Has the patient traveled out of the country within the last 30 days? ---No Does the patient require triage? ---Yes Related visit to physician within the last 2 weeks? ---No Does the PT have any chronic conditions? (i.e. diabetes, asthma, etc.) ---Yes List chronic conditions. ---H/O 3 heart attacks, depression, acid reflux, high cholesterol Guidelines Guideline Title Affirmed Question Affirmed Notes Nurse Date/Time Eilene Ghazi Time) Chest Pain [1] Chest pain lasts > 5 minutes AND [2] history of heart disease (i.e., heart attack, bypass surgery, angina, angioplasty, CHF; not just a heart murmur) Carola Rhine 08/15/2014 4:59:50 PM Disp. Time Eilene Ghazi Time) Disposition Final User 08/15/2014 5:04:44 PM 911 Follow Up Call Attempted Malva Cogan, RN, Juliann Pulse Reason: Caller states that she  is not going to call 911 now as advised, states that her sp is on the way home now & she will either call 911 after he gets home or have him to drive her to local ED for evaluation. PLEASE NOTE: All timestamps contained within this report are represented as [place] Standard Time. CONFIDENTIALTY NOTICE: This fax transmission is intended only for the addressee. It contains information that is legally privileged, confidential or otherwise protected from use or disclosure. If you are not the intended recipient, you are strictly prohibited from reviewing, disclosing, copying using or disseminating any of this information or taking any action in reliance on or regarding this information. If you have received this fax in error, please notify us immediately by telephone so that we can arrange for its return to [place]. Phone: [phone], Toll-Free: [phone], Fax: [phone] Page: 2 of 2 Call Id: [id number] 08/15/2014 5:03:22 PM Call EMS 911 Now Yes Malva Cogan, RN, Gara Kroner Understands: Yes Disagree/Comply: Disagree Disagree/Comply Reason: Disagree with instructions Care Advice Given Per Guideline CALL EMS 911 NOW: Immediate medical attention is needed. You need to hang up and call 911 (or an ambulance). Psychologist, forensic Discretion: I'll call you back in a few minutes to be sure you were able to reach them.) CARE ADVICE given per Chest Pain (Adult) guideline. After Care Instructions Given Call Event Type User Date / Time Description

## 2014-09-01 ENCOUNTER — Other Ambulatory Visit: Payer: Self-pay | Admitting: Family

## 2014-09-11 ENCOUNTER — Other Ambulatory Visit: Payer: Self-pay | Admitting: Internal Medicine

## 2014-09-18 ENCOUNTER — Other Ambulatory Visit: Payer: Self-pay | Admitting: Internal Medicine

## 2014-09-19 ENCOUNTER — Other Ambulatory Visit: Payer: Self-pay | Admitting: Family

## 2014-10-01 ENCOUNTER — Other Ambulatory Visit: Payer: Self-pay | Admitting: Family

## 2014-10-06 ENCOUNTER — Ambulatory Visit (INDEPENDENT_AMBULATORY_CARE_PROVIDER_SITE_OTHER): Payer: Managed Care, Other (non HMO)

## 2014-10-06 ENCOUNTER — Telehealth: Payer: Self-pay | Admitting: Family

## 2014-10-06 DIAGNOSIS — E538 Deficiency of other specified B group vitamins: Secondary | ICD-10-CM | POA: Diagnosis not present

## 2014-10-06 MED ORDER — CYANOCOBALAMIN 1000 MCG/ML IJ SOLN
1000.0000 ug | Freq: Once | INTRAMUSCULAR | Status: AC
  Administered 2014-10-06: 1000 ug via INTRAMUSCULAR

## 2014-10-06 NOTE — Telephone Encounter (Addendum)
Pt will get est with new NP CORY in April. Pt would like to have last cpx with NP campbell. Pt is sch for 01-16-15. Is Megan Salon working that week?

## 2014-10-10 NOTE — Telephone Encounter (Signed)
I have no idea if Abby Potash is working that week. We are unsure what Padonda's schedule will be from one month to the next. If pt is establishing with Tommi Rumps in April, she should have he cpx with him.

## 2014-10-14 ENCOUNTER — Other Ambulatory Visit: Payer: Self-pay | Admitting: Internal Medicine

## 2014-11-02 ENCOUNTER — Other Ambulatory Visit: Payer: Self-pay | Admitting: Family

## 2014-11-12 ENCOUNTER — Other Ambulatory Visit: Payer: Self-pay | Admitting: Internal Medicine

## 2014-11-14 ENCOUNTER — Other Ambulatory Visit: Payer: Self-pay

## 2014-11-16 ENCOUNTER — Ambulatory Visit (INDEPENDENT_AMBULATORY_CARE_PROVIDER_SITE_OTHER): Payer: Managed Care, Other (non HMO)

## 2014-11-16 DIAGNOSIS — E538 Deficiency of other specified B group vitamins: Secondary | ICD-10-CM | POA: Diagnosis not present

## 2014-11-16 MED ORDER — CYANOCOBALAMIN 1000 MCG/ML IJ SOLN
1000.0000 ug | Freq: Once | INTRAMUSCULAR | Status: AC
  Administered 2014-11-16: 1000 ug via INTRAMUSCULAR

## 2014-11-17 ENCOUNTER — Other Ambulatory Visit: Payer: Self-pay | Admitting: Internal Medicine

## 2014-11-27 ENCOUNTER — Telehealth: Payer: Self-pay | Admitting: Internal Medicine

## 2014-11-27 NOTE — Telephone Encounter (Signed)
Spoke with Estill Bamberg at dental office. Informed her that dentist should let us know how long he wants to hold the plavix and aspirin for.  Per Dr. Harrington Challenger patient is ok to hold plavix, since her last stent is from 2013.  Eunice Blase to call back if he wants to hold anything, and let us know.  Also advised ass for antibiotics, patient does not have a history of valve replacement, so from a cardiac standpoint she would not need antibiotics.

## 2014-11-27 NOTE — Telephone Encounter (Signed)
New message      What dental office are you calling from? Dr Burnard Bunting 1. What is your office phone and fax number? Fax [phone]  2. What type of procedure is the patient having performed? Dental extraxction  3. What date is procedure scheduled? 12-06-14  4. What is your question (ex. Antibiotics prior to procedure, holding medication-we need to know how long dentist wants pt to hold med)? plavix and aspirin.  Will she need an antibiotic prior to extraction

## 2014-12-11 ENCOUNTER — Other Ambulatory Visit: Payer: Self-pay | Admitting: Family

## 2014-12-15 ENCOUNTER — Other Ambulatory Visit: Payer: Self-pay | Admitting: Family

## 2014-12-18 ENCOUNTER — Other Ambulatory Visit: Payer: Self-pay | Admitting: Internal Medicine

## 2014-12-19 ENCOUNTER — Other Ambulatory Visit: Payer: Self-pay | Admitting: Internal Medicine

## 2014-12-20 ENCOUNTER — Other Ambulatory Visit: Payer: Self-pay | Admitting: Internal Medicine

## 2015-01-08 ENCOUNTER — Other Ambulatory Visit: Payer: Self-pay | Admitting: Family

## 2015-01-09 ENCOUNTER — Other Ambulatory Visit: Payer: Medicare Other

## 2015-01-09 NOTE — Telephone Encounter (Signed)
Pt has sch with cory

## 2015-01-16 ENCOUNTER — Encounter: Payer: Medicare Other | Admitting: Family

## 2015-01-19 ENCOUNTER — Other Ambulatory Visit: Payer: Self-pay | Admitting: Internal Medicine

## 2015-01-21 ENCOUNTER — Other Ambulatory Visit: Payer: Self-pay | Admitting: Internal Medicine

## 2015-01-23 ENCOUNTER — Other Ambulatory Visit: Payer: Self-pay | Admitting: Internal Medicine

## 2015-01-24 ENCOUNTER — Ambulatory Visit (INDEPENDENT_AMBULATORY_CARE_PROVIDER_SITE_OTHER): Payer: Managed Care, Other (non HMO) | Admitting: Adult Health

## 2015-01-24 ENCOUNTER — Encounter: Payer: Self-pay | Admitting: Adult Health

## 2015-01-24 VITALS — BP 110/70 | Temp 97.9°F | Ht 62.0 in | Wt 170.9 lb

## 2015-01-24 DIAGNOSIS — E538 Deficiency of other specified B group vitamins: Secondary | ICD-10-CM

## 2015-01-24 DIAGNOSIS — Z780 Asymptomatic menopausal state: Secondary | ICD-10-CM | POA: Diagnosis not present

## 2015-01-24 DIAGNOSIS — Z1239 Encounter for other screening for malignant neoplasm of breast: Secondary | ICD-10-CM | POA: Diagnosis not present

## 2015-01-24 DIAGNOSIS — Z7189 Other specified counseling: Secondary | ICD-10-CM

## 2015-01-24 DIAGNOSIS — G4700 Insomnia, unspecified: Secondary | ICD-10-CM

## 2015-01-24 DIAGNOSIS — Z23 Encounter for immunization: Secondary | ICD-10-CM | POA: Diagnosis not present

## 2015-01-24 DIAGNOSIS — Z7689 Persons encountering health services in other specified circumstances: Secondary | ICD-10-CM

## 2015-01-24 DIAGNOSIS — R635 Abnormal weight gain: Secondary | ICD-10-CM

## 2015-01-24 DIAGNOSIS — M5440 Lumbago with sciatica, unspecified side: Secondary | ICD-10-CM

## 2015-01-24 DIAGNOSIS — T50905A Adverse effect of unspecified drugs, medicaments and biological substances, initial encounter: Secondary | ICD-10-CM

## 2015-01-24 MED ORDER — ZOLPIDEM TARTRATE 5 MG PO TABS: 10.0000 mg | ORAL_TABLET | Freq: Every evening | ORAL | Status: DC | PRN

## 2015-01-24 MED ORDER — ZOLPIDEM TARTRATE 5 MG PO TABS: 5.0000 mg | ORAL_TABLET | Freq: Every evening | ORAL | Status: DC | PRN

## 2015-01-24 MED ORDER — CYANOCOBALAMIN 1000 MCG/ML IJ SOLN
1000.0000 ug | Freq: Once | INTRAMUSCULAR | Status: AC
  Administered 2015-01-24: 1000 ug via INTRAMUSCULAR

## 2015-01-24 MED ORDER — TRAMADOL HCL 50 MG PO TABS: 50.0000 mg | ORAL_TABLET | Freq: Four times a day (QID) | ORAL | Status: DC | PRN

## 2015-01-24 NOTE — Patient Instructions (Signed)
Stop the Wellbutrin and just take the Celexa.   Follow up with me in 3 weeks for complete physical

## 2015-01-24 NOTE — Addendum Note (Signed)
Addended by: Apolinar Junes on: 01/24/2015 11:55 AM   Modules accepted: Level of Service

## 2015-01-24 NOTE — Progress Notes (Signed)
HPI:  Bianca [name] is here to establish care. She is a pleasant Caucasian female who smokes. She has a significant cardiac history. Including MI x 3 with 5 stents. She had right hip surgery in October.   Last PCP and physical:01/10/2014 With NP Justin Mend  Immunizations:Needs Shingles- will check with insurance Diet:No fried foods, vegetables fruits. Fish and chicken.  Exercise:Walk, does not do a lot do to chronic back pain Colonoscopy:2011- every ten years Dexa:Will schedule Pap Smear:No abnormal. Mammogram:Will schedule Dentist: Goes yearly Eye: Gets eye exam yearly  Has the following chronic problems that require follow up and concerns today:  Weight Gain - has gained 20 pounds since starting Wellbutrin in October. She denies any increase in appetitie, and endorses eating a healthy diet.   Smoking Cessation  - Has quit three times since January. Has not tried anything in the past. Will check with cardiology about using patch.    ROS negative for unless reported above: fevers, chills,feeling poorly, unintentional weight loss, hearing or vision loss, chest pain, palpitations, leg claudication, struggling to breath,Not feeling congested in the chest, no orthopenia, no cough,no wheezing, normal appetite, no soft tissue swelling, no hemoptysis, melena, hematochezia, hematuria, falls, loc, si, or thoughts of self harm.    Past Medical History  Diagnosis Date  . CAD (coronary artery disease)     a. 1997 MI/PCI RCA;  b. 2007 MI/PCI of 100% RCA with Taxus DES x 3 placed, EF 55%;  c. 2010 Cath: stable anatomy;  d. 05/2012 NSTEMI/Cath/PCI: LM nl, LAD 60-58m, D1 20ost, LCX 17m, RCA 40-97m ISR, 60/95d (Treated w/ 2.75x33 Xience Xpedition DES), PDA 63m (Treated w/ PTCA), EF 60%.  . Depression   . Hyperlipidemia   . Carpal tunnel syndrome on both sides   . Numbness of foot left foot    "never recovered from last back OR, 2009" (05/17/2012)  . GERD (gastroesophageal reflux disease)   .  Arthritis     "back; probably knees" (05/17/2012)  . Chronic lower back pain   . Anginal pain   . Myocardial infarction  1997, 2007 , last 2013    x 3    Past Surgical History  Procedure Laterality Date  . Cesarean section  1990  . Ankle surgery  years ago    left; "had to take out a floater"  . Posterior fusion lumbar spine  1962; 2009  . Coronary angioplasty with stent placement  1997; 2007, 2013    "2 + 2; + 1= total of 5  . 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 Left heart catheterization with coronary angiogram N/A 05/18/2012    Procedure: LEFT HEART CATHETERIZATION WITH CORONARY ANGIOGRAM;  Surgeon: Wellington Hampshire, MD;  Location: [facility];  Service: Cardiovascular;  Laterality: N/A;    Family History  Problem Relation Age of Onset  . Coronary artery disease Mother   . Diabetes Mother   . Hypertension Mother   . Dementia Father   . Sudden death Brother     suicide  . Cancer Paternal Grandfather     esophageal    History   Social History  . Marital Status: Married    Spouse Name: N/A  . Number of Children: N/A  . Years of Education: N/A   Social History Main Topics  . Smoking status: Current Every Day Smoker --  0.50 packs/day for 50 years    Types: Cigarettes  . Smokeless tobacco: Never Used  . Alcohol Use: No  . Drug Use: No  . Sexual Activity: Not Currently   Other Topics Concern  . Not on file   Social History Narrative     Current outpatient prescriptions:  .  albuterol (PROVENTIL HFA;VENTOLIN HFA) 108 (90 BASE) MCG/ACT inhaler, Inhale 2 puffs into the lungs every 6 (six) hours as needed for wheezing or shortness of breath., Disp: 1 Inhaler, Rfl: 2 .  Alum & Mag Hydroxide-Simeth (MAGIC MOUTHWASH W/LIDOCAINE) SOLN, Take 5 mLs by mouth 4 (four) times daily as needed for mouth pain., Disp: 120  mL, Rfl: 0 .  buPROPion (WELLBUTRIN XL) 150 MG 24 hr tablet, TAKE 1 TABLET (150 MG TOTAL) BY MOUTH DAILY., Disp: 30 tablet, Rfl: 3 .  clopidogrel (PLAVIX) 75 MG tablet, TAKE 1 TABLET BY MOUTH ONCE DAILY, Disp: 30 tablet, Rfl: 2 .  escitalopram (LEXAPRO) 20 MG tablet, TAKE 1 TABLET BY MOUTH EVERY DAY, Disp: 90 tablet, Rfl: 0 .  fluticasone (FLONASE) 50 MCG/ACT nasal spray, Place 2 sprays into both nostrils daily., Disp: 16 g, Rfl: 0 .  metoprolol (LOPRESSOR) 50 MG tablet, TAKE 1/2 TABLETS (25 MG TOTAL) BY MOUTH 2 (TWO) TIMES DAILY., Disp: 90 tablet, Rfl: 1 .  pantoprazole (PROTONIX) 40 MG tablet, TAKE 1 TABLET BY MOUTH DAILY., Disp: 30 tablet, Rfl: 0 .  simvastatin (ZOCOR) 80 MG tablet, TAKE 1 TABLET BY MOUTH AT BEDTIME, Disp: 30 tablet, Rfl: 0 .  traMADol (ULTRAM) 50 MG tablet, TAKE 2 TABLET BY MOUTH EVERY 8 HOURS AS NEEDED FOR PAIN., Disp: 180 tablet, Rfl: 1 .  zolpidem (AMBIEN) 10 MG tablet, TAKE 1 TABLET BY MOUTH AT BEDTIME AS NEEDED, Disp: 30 tablet, Rfl: 2 .  ferrous sulfate (FERROUSUL) 325 (65 FE) MG tablet, Take 1 tablet (325 mg total) by mouth 2 (two) times daily with a meal. (Patient not taking: Reported on 01/24/2015), Disp: 42 tablet, Rfl: 0 .  nitroGLYCERIN (NITROSTAT) 0.4 MG SL tablet, Place 0.4 mg under the tongue every 5 (five) minutes as needed for chest pain., Disp: , Rfl:   EXAM:  Filed Vitals:   01/24/15 1002  BP: 110/70  Temp: 97.9 F (36.6 C)    Body mass index is 31.25 kg/(m^2).  GENERAL: vitals reviewed and listed above, alert, oriented, appears well hydrated and in no acute distress. Slightly obese around abdomen.   HEENT: atraumatic, conjunttiva clear, no obvious abnormalities on inspection of external nose and ears. TM's visualized in bilateral ear canal. Wearing glasses  NECK: Neck is soft and supple without masses, no adenopathy or thyromegaly, trachea midline, no JVD. Normal range of motion.   LUNGS: clear to auscultation bilaterally, no wheezes, rales or  rhonchi, good air movement  CV: Regular rate and rhythm, normal S1/S2, no audible murmurs, gallops, or rubs. No carotid bruit and no peripheral edema.   MS: moves all extremities without noticeable abnormality. No edema noted  Abd: soft/nontender/nondistended/normal bowel sounds   Skin: warm and dry, no rash. Scar on right knee from prior surgery  Extremities: No clubbing, cyanosis, or edema. Capillary refill is WNL. Pulses intact bilaterally in upper and lower extremities.   Neuro: CN II-XII intact, sensation and reflexes normal throughout, 5/5 muscle strength in bilateral upper and lower extremities. Normal finger to nose. Normal rapid alternating movements.  PSYCH: pleasant and cooperative, no obvious depression or anxiety  ASSESSMENT AND PLAN:  1. Encounter to establish care -  Follow up in three weeks for CPE  2. Breast cancer screening - MM Digital Screening; Future  3. Insomnia - She is willing to taper back to 5 mg Ambien and seek sleep specialist. - Ambulatory referral to Sleep Studies - zolpidem (AMBIEN) 5 MG tablet; Take 1 tablet (5 mg total) by mouth at bedtime as needed for sleep.  Dispense: 30 tablet; Refill: 0  4. Postmenopausal - DG Bone Density; Future  5. Need for pneumococcal vaccination - Pneumococcal conjugate vaccine 13-valent IM  6. Vitamin B12 deficiency - cyanocobalamin ((VITAMIN B-12)) injection 1,000 mcg; Inject 1 mL (1,000 mcg total) into the muscle once.  7. Low back pain with sciatica, sciatica laterality unspecified, unspecified back pain laterality - She is cutting back on her Tramadol use. Is currently taking 3 pills.  - traMADol (ULTRAM) 50 MG tablet; Take 1 tablet (50 mg total) by mouth every 6 (six) hours as needed.  Dispense: 120 tablet; Refill: 0  8. Weight gain due to medication - Stop Wellbutrin and just use Lexapro 20 mg.  - Follow up in three weeks.  - Go to the ER with any depression or Si   Discussed the following assessment  and plan:  Breast cancer screening - Plan: MM Digital Screening  Postmenopausal - Plan: DG Bone Density -We reviewed the PMH, PSH, FH, SH, Meds and Allergies. -We provided refills for any medications we will prescribe as needed. -We addressed current concerns per orders and patient instructions. -We have asked for records for pertinent exams, studies, vaccines and notes from previous providers. -We have advised patient to follow up per instructions below.   -Patient advised to return or notify a provider immediately if symptoms worsen or persist or new concerns arise.  There are no Patient Instructions on file for this visit.   [employer]

## 2015-01-24 NOTE — Progress Notes (Signed)
Pre visit review using our clinic review tool, if applicable. No additional management support is needed unless otherwise documented below in the visit note. 

## 2015-01-24 NOTE — Addendum Note (Signed)
Addended by: Apolinar Junes on: 01/24/2015 04:52 PM   Modules accepted: Level of Service

## 2015-01-27 ENCOUNTER — Other Ambulatory Visit: Payer: Self-pay | Admitting: Family

## 2015-01-27 ENCOUNTER — Other Ambulatory Visit: Payer: Self-pay | Admitting: Internal Medicine

## 2015-01-31 ENCOUNTER — Institutional Professional Consult (permissible substitution): Payer: Medicare Other | Admitting: Pulmonary Disease

## 2015-02-05 ENCOUNTER — Other Ambulatory Visit: Payer: Self-pay | Admitting: Family

## 2015-02-14 ENCOUNTER — Encounter: Payer: Self-pay | Admitting: Adult Health

## 2015-02-14 ENCOUNTER — Ambulatory Visit (INDEPENDENT_AMBULATORY_CARE_PROVIDER_SITE_OTHER): Payer: Managed Care, Other (non HMO) | Admitting: Adult Health

## 2015-02-14 VITALS — BP 110/68 | Temp 98.0°F | Ht 61.5 in | Wt 169.7 lb

## 2015-02-14 DIAGNOSIS — Z0000 Encounter for general adult medical examination without abnormal findings: Secondary | ICD-10-CM | POA: Diagnosis not present

## 2015-02-14 DIAGNOSIS — G4700 Insomnia, unspecified: Secondary | ICD-10-CM | POA: Diagnosis not present

## 2015-02-14 DIAGNOSIS — M5440 Lumbago with sciatica, unspecified side: Secondary | ICD-10-CM | POA: Diagnosis not present

## 2015-02-14 DIAGNOSIS — F17200 Nicotine dependence, unspecified, uncomplicated: Secondary | ICD-10-CM

## 2015-02-14 DIAGNOSIS — Z720 Tobacco use: Secondary | ICD-10-CM

## 2015-02-14 DIAGNOSIS — E785 Hyperlipidemia, unspecified: Secondary | ICD-10-CM | POA: Diagnosis not present

## 2015-02-14 LAB — BASIC METABOLIC PANEL
BUN: 10 mg/dL (ref 6–23)
CALCIUM: 9.2 mg/dL (ref 8.4–10.5)
CHLORIDE: 105 meq/L (ref 96–112)
CO2: 28 meq/L (ref 19–32)
Creatinine, Ser: 0.78 mg/dL (ref 0.40–1.20)
GFR: 77.8 mL/min (ref >60.00)
Glucose, Bld: 101 mg/dL — ABNORMAL HIGH (ref 70–99)
Potassium: 4.4 mEq/L (ref 3.5–5.1)
Sodium: 139 mEq/L (ref 135–145)

## 2015-02-14 LAB — LIPID PANEL
Cholesterol: 181 mg/dL (ref 0–200)
HDL: 39.8 mg/dL (ref >39.00)
NonHDL: 141.2
Total CHOL/HDL Ratio: 5
Triglycerides: 218 mg/dL — ABNORMAL HIGH (ref 0.0–149.0)
VLDL: 43.6 mg/dL — AB (ref 0.0–40.0)

## 2015-02-14 LAB — CBC WITH DIFFERENTIAL/PLATELET
BASOS PCT: 0.2 % (ref 0.0–3.0)
Basophils Absolute: 0 10*3/uL (ref 0.0–0.1)
EOS PCT: 2.8 % (ref 0.0–5.0)
Eosinophils Absolute: 0.2 10*3/uL (ref 0.0–0.7)
HCT: 37.9 % (ref 36.0–46.0)
HEMOGLOBIN: 12.9 g/dL (ref 12.0–15.0)
LYMPHS PCT: 41.2 % (ref 12.0–46.0)
Lymphs Abs: 2.3 10*3/uL (ref 0.7–4.0)
MCHC: 34 g/dL (ref 30.0–36.0)
MCV: 91.8 fl (ref 78.0–100.0)
Monocytes Absolute: 0.7 10*3/uL (ref 0.1–1.0)
Monocytes Relative: 12.3 % — ABNORMAL HIGH (ref 3.0–12.0)
NEUTROS PCT: 43.5 % (ref 43.0–77.0)
Neutro Abs: 2.4 10*3/uL (ref 1.4–7.7)
Platelets: 222 10*3/uL (ref 150.0–400.0)
RBC: 4.13 Mil/uL (ref 3.87–5.11)
RDW: 12.7 % (ref 11.5–15.5)
WBC: 5.6 10*3/uL (ref 4.0–10.5)

## 2015-02-14 LAB — LDL CHOLESTEROL, DIRECT: LDL DIRECT: 105 mg/dL

## 2015-02-14 LAB — HEPATIC FUNCTION PANEL
ALBUMIN: 4 g/dL (ref 3.5–5.2)
ALT: 13 U/L (ref 0–35)
AST: 22 U/L (ref 0–37)
Alkaline Phosphatase: 73 U/L (ref 39–117)
BILIRUBIN TOTAL: 0.6 mg/dL (ref 0.2–1.2)
Bilirubin, Direct: 0 mg/dL (ref 0.0–0.3)
Total Protein: 7 g/dL (ref 6.0–8.3)

## 2015-02-14 LAB — POCT URINALYSIS DIPSTICK
Bilirubin, UA: NEGATIVE
Glucose, UA: NEGATIVE
KETONES UA: NEGATIVE
Nitrite, UA: NEGATIVE
PROTEIN UA: NEGATIVE
SPEC GRAV UA: 1.02
Urobilinogen, UA: 0.2
pH, UA: 5.5

## 2015-02-14 LAB — TSH: TSH: 3.53 u[IU]/mL (ref 0.35–4.50)

## 2015-02-14 LAB — HEMOGLOBIN A1C: Hgb A1c MFr Bld: 5.8 % (ref 4.6–6.5)

## 2015-02-14 MED ORDER — TRAMADOL HCL 50 MG PO TABS: 50.0000 mg | ORAL_TABLET | Freq: Four times a day (QID) | ORAL | Status: DC | PRN

## 2015-02-14 MED ORDER — SUVOREXANT 20 MG PO TABS: 20.0000 mg | ORAL_TABLET | Freq: Every evening | ORAL | Status: DC

## 2015-02-14 MED ORDER — ESCITALOPRAM OXALATE 20 MG PO TABS: 20.0000 mg | ORAL_TABLET | Freq: Every day | ORAL | Status: DC

## 2015-02-14 MED ORDER — SUVOREXANT 10 MG PO TABS: 10.0000 mg | ORAL_TABLET | Freq: Every evening | ORAL | Status: DC

## 2015-02-14 MED ORDER — SUVOREXANT 15 MG PO TABS: 15.0000 mg | ORAL_TABLET | Freq: Every evening | ORAL | Status: DC

## 2015-02-14 MED ORDER — SIMVASTATIN 80 MG PO TABS: 80.0000 mg | ORAL_TABLET | Freq: Every day | ORAL | Status: DC

## 2015-02-14 NOTE — Patient Instructions (Addendum)
Please continue to exercise and eat healthy.   Try the belsomra and let me know how you do on it. It will take acouple of nights to make the switch but be patient. Health Maintenance Adopting a healthy lifestyle and getting preventive care can go a long way to promote health and wellness. Talk with your health care provider about what schedule of regular examinations is right for you. This is a good chance for you to check in with your provider about disease prevention and staying healthy. In between checkups, there are plenty of things you can do on your own. Experts have done a lot of research about which lifestyle changes and preventive measures are most likely to keep you healthy. Ask your health care provider for more information. WEIGHT AND DIET  Eat a healthy diet  Be sure to include plenty of vegetables, fruits, low-fat dairy products, and lean protein.  Do not eat a lot of foods high in solid fats, added sugars, or salt.  Get regular exercise. This is one of the most important things you can do for your health.  Most adults should exercise for at least 150 minutes each week. The exercise should increase your heart rate and make you sweat (moderate-intensity exercise).  Most adults should also do strengthening exercises at least twice a week. This is in addition to the moderate-intensity exercise.  Maintain a healthy weight  Body mass index (BMI) is a measurement that can be used to identify possible weight problems. It estimates body fat based on height and weight. Your health care provider can help determine your BMI and help you achieve or maintain a healthy weight.  For females 70 years of age and older:   A BMI below 18.5 is considered underweight.  A BMI of 18.5 to 24.9 is normal.  A BMI of 25 to 29.9 is considered overweight.  A BMI of 30 and above is considered obese.  Watch levels of cholesterol and blood lipids  You should start having your blood tested for lipids  and cholesterol at 69 years of age, then have this test every 5 years.  You may need to have your cholesterol levels checked more often if:  Your lipid or cholesterol levels are high.  You are older than 69 years of age.  You are at high risk for heart disease.  CANCER SCREENING   Lung Cancer  Lung cancer screening is recommended for adults 43-106 years old who are at high risk for lung cancer because of a history of smoking.  A yearly low-dose CT scan of the lungs is recommended for people who:  Currently smoke.  Have quit within the past 15 years.  Have at least a 30-pack-year history of smoking. A pack year is smoking an average of one pack of cigarettes a day for 1 year.  Yearly screening should continue until it has been 15 years since you quit.  Yearly screening should stop if you develop a health problem that would prevent you from having lung cancer treatment.  Breast Cancer  Practice breast self-awareness. This means understanding how your breasts normally appear and feel.  It also means doing regular breast self-exams. Let your health care provider know about any changes, no matter how small.  If you are in your 20s or 30s, you should have a clinical breast exam (CBE) by a health care provider every 1-3 years as part of a regular health exam.  If you are 76 or older, have a CBE  every year. Also consider having a breast X-ray (mammogram) every year.  If you have a family history of breast cancer, talk to your health care provider about genetic screening.  If you are at high risk for breast cancer, talk to your health care provider about having an MRI and a mammogram every year.  Breast cancer gene (BRCA) assessment is recommended for women who have family members with BRCA-related cancers. BRCA-related cancers include:  Breast.  Ovarian.  Tubal.  Peritoneal cancers.  Results of the assessment will determine the need for genetic counseling and BRCA1 and  BRCA2 testing. Cervical Cancer Routine pelvic examinations to screen for cervical cancer are no longer recommended for nonpregnant women who are considered low risk for cancer of the pelvic organs (ovaries, uterus, and vagina) and who do not have symptoms. A pelvic examination may be necessary if you have symptoms including those associated with pelvic infections. Ask your health care provider if a screening pelvic exam is right for you.   The Pap test is the screening test for cervical cancer for women who are considered at risk.  If you had a hysterectomy for a problem that was not cancer or a condition that could lead to cancer, then you no longer need Pap tests.  If you are older than 65 years, and you have had normal Pap tests for the past 10 years, you no longer need to have Pap tests.  If you have had past treatment for cervical cancer or a condition that could lead to cancer, you need Pap tests and screening for cancer for at least 20 years after your treatment.  If you no longer get a Pap test, assess your risk factors if they change (such as having a new sexual partner). This can affect whether you should start being screened again.  Some women have medical problems that increase their chance of getting cervical cancer. If this is the case for you, your health care provider may recommend more frequent screening and Pap tests.  The human papillomavirus (HPV) test is another test that may be used for cervical cancer screening. The HPV test looks for the virus that can cause cell changes in the cervix. The cells collected during the Pap test can be tested for HPV.  The HPV test can be used to screen women 74 years of age and older. Getting tested for HPV can extend the interval between normal Pap tests from three to five years.  An HPV test also should be used to screen women of any age who have unclear Pap test results.  After 69 years of age, women should have HPV testing as often as  Pap tests.  Colorectal Cancer  This type of cancer can be detected and often prevented.  Routine colorectal cancer screening usually begins at 69 years of age and continues through 69 years of age.  Your health care provider may recommend screening at an earlier age if you have risk factors for colon cancer.  Your health care provider may also recommend using home test kits to check for hidden blood in the stool.  A small camera at the end of a tube can be used to examine your colon directly (sigmoidoscopy or colonoscopy). This is done to check for the earliest forms of colorectal cancer.  Routine screening usually begins at age 63.  Direct examination of the colon should be repeated every 5-10 years through 69 years of age. However, you may need to be screened more often if  early forms of precancerous polyps or small growths are found. Skin Cancer  Check your skin from head to toe regularly.  Tell your health care provider about any new moles or changes in moles, especially if there is a change in a mole's shape or color.  Also tell your health care provider if you have a mole that is larger than the size of a pencil eraser.  Always use sunscreen. Apply sunscreen liberally and repeatedly throughout the day.  Protect yourself by wearing long sleeves, pants, a wide-brimmed hat, and sunglasses whenever you are outside. HEART DISEASE, DIABETES, AND HIGH BLOOD PRESSURE   Have your blood pressure checked at least every 1-2 years. High blood pressure causes heart disease and increases the risk of stroke.  If you are between 58 years and 8 years old, ask your health care provider if you should take aspirin to prevent strokes.  Have regular diabetes screenings. This involves taking a blood sample to check your fasting blood sugar level.  If you are at a normal weight and have a low risk for diabetes, have this test once every three years after 68 years of age.  If you are overweight  and have a high risk for diabetes, consider being tested at a younger age or more often. PREVENTING INFECTION  Hepatitis B  If you have a higher risk for hepatitis B, you should be screened for this virus. You are considered at high risk for hepatitis B if:  You were born in a country where hepatitis B is common. Ask your health care provider which countries are considered high risk.  Your parents were born in a high-risk country, and you have not been immunized against hepatitis B (hepatitis B vaccine).  You have HIV or AIDS.  You use needles to inject street drugs.  You live with someone who has hepatitis B.  You have had sex with someone who has hepatitis B.  You get hemodialysis treatment.  You take certain medicines for conditions, including cancer, organ transplantation, and autoimmune conditions. Hepatitis C  Blood testing is recommended for:  Everyone born from [birth year] through 1965.  Anyone with known risk factors for hepatitis C. Sexually transmitted infections (STIs)  You should be screened for sexually transmitted infections (STIs) including gonorrhea and chlamydia if:  You are sexually active and are younger than 69 years of age.  You are older than 69 years of age and your health care provider tells you that you are at risk for this type of infection.  Your sexual activity has changed since you were last screened and you are at an increased risk for chlamydia or gonorrhea. Ask your health care provider if you are at risk.  If you do not have HIV, but are at risk, it may be recommended that you take a prescription medicine daily to prevent HIV infection. This is called pre-exposure prophylaxis (PrEP). You are considered at risk if:  You are sexually active and do not regularly use condoms or know the HIV status of your partner(s).  You take drugs by injection.  You are sexually active with a partner who has HIV. Talk with your health care provider about whether  you are at high risk of being infected with HIV. If you choose to begin PrEP, you should first be tested for HIV. You should then be tested every 3 months for as long as you are taking PrEP.  PREGNANCY   If you are premenopausal and you may become pregnant, ask  your health care provider about preconception counseling.  If you may become pregnant, take 400 to 800 micrograms (mcg) of folic acid every day.  If you want to prevent pregnancy, talk to your health care provider about birth control (contraception). OSTEOPOROSIS AND MENOPAUSE   Osteoporosis is a disease in which the bones lose minerals and strength with aging. This can result in serious bone fractures. Your risk for osteoporosis can be identified using a bone density scan.  If you are 64 years of age or older, or if you are at risk for osteoporosis and fractures, ask your health care provider if you should be screened.  Ask your health care provider whether you should take a calcium or vitamin D supplement to lower your risk for osteoporosis.  Menopause may have certain physical symptoms and risks.  Hormone replacement therapy may reduce some of these symptoms and risks. Talk to your health care provider about whether hormone replacement therapy is right for you.  HOME CARE INSTRUCTIONS   Schedule regular health, dental, and eye exams.  Stay current with your immunizations.   Do not use any tobacco products including cigarettes, chewing tobacco, or electronic cigarettes.  If you are pregnant, do not drink alcohol.  If you are breastfeeding, limit how much and how often you drink alcohol.  Limit alcohol intake to no more than 1 drink per day for nonpregnant women. One drink equals 12 ounces of beer, 5 ounces of wine, or 1 ounces of hard liquor.  Do not use street drugs.  Do not share needles.  Ask your health care provider for help if you need support or information about quitting drugs.  Tell your health care  provider if you often feel depressed.  Tell your health care provider if you have ever been abused or do not feel safe at home. Document Released: 02/17/2011 Document Revised: 12/19/2013 Document Reviewed: 07/06/2013 [employer] [facility], [facility] Patient Information 2015 [place], [place]. This information is not intended to replace advice given to you by your health care provider. Make sure you discuss any questions you have with your health care provider.

## 2015-02-14 NOTE — Progress Notes (Signed)
Subjective:    Patient ID: Bianca [name], female    DOB: 02-02-1946, 69 y.o.   MRN: [id number]  HPI HPI 69 y.o. white female presents today for a yearly physical. Pt has hx significant for depression, hyperlipidemia, hypertension, carpal tunnel syndrome bilaterally, numbness to left foot, GERD, arthritis, chronic lower back pain, myocardial infarction with 5 stents, CAD. Medications were discussed in detail with patient. Pt is up to date on immunizations, will find out about shingles shot from insurance, mammogram and yearly eye exams. She is going to return to the eye doctor because she was told she has macular degeneration.    Pt denies fever, fatigue, SOB and change in appetite.   She has started to eat healthy and exercise.   She is trying to quit smoking - does not want any smoking cessation aids.    Review of Systems  Constitutional: Negative.   HENT: Negative.   Eyes: Negative.   Respiratory: Negative.   Cardiovascular: Negative.   Gastrointestinal: Negative.   Endocrine: Negative.   Genitourinary: Negative.   Musculoskeletal: Positive for myalgias, back pain (lower back) and arthralgias. Negative for joint swelling, gait problem, neck pain and neck stiffness.  Skin: Negative.   Allergic/Immunologic: Negative.   Neurological: Positive for numbness (in left foot from back surgery).  Hematological: Negative.   Psychiatric/Behavioral: Positive for sleep disturbance.  All other systems reviewed and are negative.      Objective:   Physical Exam  Physical Exam  Constitutional: She is oriented to person, place, and time. Vital signs are normal. She appears well-developed and well-nourished. She is active.  HENT:  Head: Normocephalic.  Right Ear: Tympanic membrane, external ear and ear canal normal.  Left Ear: Tympanic membrane, external ear and ear canal normal.  Nose: Nose normal.  Mouth/Throat: Uvula is midline, oropharynx is clear and moist and mucous membranes are  normal.  Eyes: Conjunctivae, EOM and lids are normal. Pupils are equal, round, and reactive to light.  Neck: Trachea normal, normal range of motion and full passive range of motion without pain. Neck supple.  Cardiovascular: Normal rate, regular rhythm, normal heart sounds, intact distal pulses and normal pulses.No carotid bruit  Pulmonary/Chest: Effort normal and breath sounds normal.  Abdominal: Soft. Normal appearance and bowel sounds are normal.  Genitourinary: Deferred by patient.  Musculoskeletal:  Decrease ROM to bilateral wrist. Normal ROM to bilateral lower extremities.  Neurological: She is alert and oriented to person, place, and time. She has normal strength and normal reflexes. She displays a negative Romberg sign.  Skin: Skin is warm, dry and intact.  Psychiatric: She has a normal mood and affect. Her speech is normal and behavior is normal. Judgment and thought content normal. Cognition and memory are normal.      Assessment & Plan:  1. Routine general medical examination at a health care facility - Continue exercising and eating healthy.  - Basic metabolic panel - Hemoglobin A1c - Hepatic function panel - Lipid panel - POCT urinalysis dipstick - TSH - CBC with Differential/Platelet - simvastatin (ZOCOR) 80 MG tablet; Take 1 tablet (80 mg total) by mouth at bedtime.  Dispense: 30 tablet; Refill: 5 - escitalopram (LEXAPRO) 20 MG tablet; Take 1 tablet (20 mg total) by mouth daily.  Dispense: 90 tablet; Refill: 0 - Follow up in one year for CPE or sooner if needed - Will follow up on labs.  2. Low back pain with sciatica, sciatica laterality unspecified, unspecified back pain laterality - traMADol (ULTRAM)  50 MG tablet; Take 1 tablet (50 mg total) by mouth every 6 (six) hours as needed.  Dispense: 120 tablet; Refill: 0 - Continue to taper off Tramadol 3. Hyperlipidemia - Basic metabolic panel - Hemoglobin A1c - Hepatic function panel - Lipid panel - POCT urinalysis  dipstick - TSH - CBC with Differential/Platelet  4. Insomnia - Suvorexant (BELSOMRA) 10 MG TABS; Take 10 mg by mouth Nightly.  Dispense: 10 tablet; Refill: 0 - Suvorexant (BELSOMRA) 15 MG TABS; Take 15 mg by mouth Nightly.  Dispense: 10 tablet; Refill: 0 - Suvorexant (BELSOMRA) 20 MG TABS; Take 20 mg by mouth Nightly.  Dispense: 10 tablet; Refill: 0  5. TOBACCO USE - She is not interested in medications to help her stop smoking.  - Less than 5 minutes spent on smoking cessation.

## 2015-02-15 ENCOUNTER — Encounter: Payer: Self-pay | Admitting: Adult Health

## 2015-02-22 ENCOUNTER — Ambulatory Visit (INDEPENDENT_AMBULATORY_CARE_PROVIDER_SITE_OTHER): Payer: Managed Care, Other (non HMO) | Admitting: Internal Medicine

## 2015-02-22 ENCOUNTER — Encounter: Payer: Self-pay | Admitting: Internal Medicine

## 2015-02-22 VITALS — BP 122/62 | HR 59 | Ht 61.5 in | Wt 169.4 lb

## 2015-02-22 DIAGNOSIS — E785 Hyperlipidemia, unspecified: Secondary | ICD-10-CM | POA: Diagnosis not present

## 2015-02-22 MED ORDER — NITROGLYCERIN 0.4 MG SL SUBL: 0.4000 mg | SUBLINGUAL_TABLET | SUBLINGUAL | Status: DC | PRN

## 2015-02-22 MED ORDER — PANTOPRAZOLE SODIUM 40 MG PO TBEC: 40.0000 mg | DELAYED_RELEASE_TABLET | Freq: Every day | ORAL | Status: DC

## 2015-02-22 NOTE — Progress Notes (Signed)
Cardiology Office Note   Date:  02/22/2015   ID:  Bianca [name], DOB Feb 22, 1946, MRN [id number]  PCP:  [name], NP  Cardiologist:   Dorris Carnes, MD   No chief complaint on file.     History of Present Illness: Bianca [name] is a 69 y.o. female with a history of CAD, s/p MI in 1997 treated with PCI to the RCA, s/p Taxus DES x3 the RCA in 2007 in the setting of MI, HTN, HL. Echo 7/10: EF 55-60%, mild MR. She was  admitted 9/30-10/2/13 with a non-STEMI. [facility] 05/18/12: mLAD 60-70%, oD1 20%, mCFX 50%, proximal to distal RCA with multiple overlapping stents, mid 40-50% ISR, distal 60/95%, mPDA 80%, EF 60%. PCI: Xience expedition DES to the RCA and balloon angioplasty to the PDA. It was noted that if the patient develops further restenosis in the RCA, best treatment option would probably CABG.  The last time I saw her in clinic was November 2014 Since seen she denies CP  Breathing is stable.  Does get SOB wit exertion but no change Planning to undergo carpal tunnel surgery      Current Outpatient Prescriptions  Medication Sig Dispense Refill  . albuterol (PROVENTIL HFA;VENTOLIN HFA) 108 (90 BASE) MCG/ACT inhaler Inhale 2 puffs into the lungs every 6 (six) hours as needed for wheezing or shortness of breath. 1 Inhaler 2  . clopidogrel (PLAVIX) 75 MG tablet TAKE 1 TABLET BY MOUTH ONCE DAILY 30 tablet 2  . escitalopram (LEXAPRO) 20 MG tablet Take 1 tablet (20 mg total) by mouth daily. 90 tablet 0  . ferrous sulfate (FERROUSUL) 325 (65 FE) MG tablet Take 1 tablet (325 mg total) by mouth 2 (two) times daily with a meal. 42 tablet 0  . fluticasone (FLONASE) 50 MCG/ACT nasal spray Place 2 sprays into both nostrils daily. (Patient taking differently: Place 2 sprays into both nostrils as needed for allergies. ) 16 g 0  . metoprolol (LOPRESSOR) 50 MG tablet TAKE 1/2 TABLETS (25 MG TOTAL) BY MOUTH 2 (TWO) TIMES DAILY. 90 tablet 1  . nitroGLYCERIN (NITROSTAT) 0.4 MG SL tablet Place 0.4 mg under  the tongue every 5 (five) minutes as needed for chest pain.    . pantoprazole (PROTONIX) 40 MG tablet TAKE 1 TABLET BY MOUTH DAILY. 30 tablet 0  . simvastatin (ZOCOR) 80 MG tablet Take 1 tablet (80 mg total) by mouth at bedtime. 30 tablet 5  . Suvorexant (BELSOMRA) 10 MG TABS Take 10 mg by mouth Nightly. 10 tablet 0  . Suvorexant (BELSOMRA) 15 MG TABS Take 15 mg by mouth Nightly. 10 tablet 0  . Suvorexant (BELSOMRA) 20 MG TABS Take 20 mg by mouth Nightly. 10 tablet 0  . traMADol (ULTRAM) 50 MG tablet Take 1 tablet (50 mg total) by mouth every 6 (six) hours as needed. 120 tablet 0   No current facility-administered medications for this visit.    Allergies:   Oxycodone hcl; Penicillins; Macrobid; and Sulfa antibiotics   Past Medical History  Diagnosis Date  . CAD (coronary artery disease)     a. 1997 MI/PCI RCA;  b. 2007 MI/PCI of 100% RCA with Taxus DES x 3 placed, EF 55%;  c. 2010 Cath: stable anatomy;  d. 05/2012 NSTEMI/Cath/PCI: LM nl, LAD 60-37m, D1 20ost, LCX 62m, RCA 40-66m ISR, 60/95d (Treated w/ 2.75x33 Xience Xpedition DES), PDA 65m (Treated w/ PTCA), EF 60%.  . Depression   . Hyperlipidemia   . Carpal tunnel syndrome on both  sides   . Numbness of foot left foot    "never recovered from last back OR, 2009" (05/17/2012)  . GERD (gastroesophageal reflux disease)   . Arthritis     "back; probably knees" (05/17/2012)  . Chronic lower back pain   . Anginal pain   . Myocardial infarction  1997, 2007 , last 2013    x 3    Past Surgical History  Procedure Laterality Date  . Cesarean section  1990  . Ankle surgery  years ago    left; "had to take out a floater"  . Posterior fusion lumbar spine  1962; 2009  . Coronary angioplasty with stent placement  1997; 2007, 2013    "2 + 2; + 1= total of 5  . Total hip arthroplasty Right 05/24/2014    Procedure: RIGHT TOTAL HIP ARTHROPLASTY ANTERIOR APPROACH;  Surgeon: Gearlean Alf, MD;  Location: [facility];  Service: Orthopedics;  Laterality:  Right;  . Left heart catheterization with coronary angiogram N/A 05/18/2012    Procedure: LEFT HEART CATHETERIZATION WITH CORONARY ANGIOGRAM;  Surgeon: Wellington Hampshire, MD;  Location: [facility];  Service: Cardiovascular;  Laterality: N/A;     Social History:  The patient  reports that she has been smoking Cigarettes.  She has a 25 pack-year smoking history. She has never used smokeless tobacco. She reports that she does not drink alcohol or use illicit drugs.   Family History:  The patient's family history includes Cancer in her paternal grandfather; Coronary artery disease in her mother; Dementia in her father; Diabetes in her mother; Hypertension in her mother; Sudden death in her brother.    ROS:  Please see the history of present illness. All other systems are reviewed and  Negative to the above problem except as noted.    PHYSICAL EXAM: VS:  BP 122/62 mmHg  Pulse 59  Ht 5' 1.5" (1.562 m)  Wt 169 lb 6.4 oz (76.839 kg)  BMI 31.49 kg/m2  SpO2 96%  GEN: Well nourished, well developed, in no acute distress HEENT: normal Neck: no JVD, carotid bruits, or masses Cardiac: RRR; no murmurs, rubs, or gallops,no edema  Respiratory:  clear to auscultation bilaterally, normal work of breathing GI: soft, nontender, nondistended, + BS  No hepatomegaly  MS: no deformity Moving all extremities   Skin: warm and dry, no rash Neuro:  Strength and sensation are intact Psych: euthymic mood, full affect   EKG:  EKG is not ordered today.   Lipid Panel    Component Value Date/Time   CHOL 181 02/14/2015 0850   TRIG 218.0* 02/14/2015 0850   HDL 39.80 02/14/2015 0850   CHOLHDL 5 02/14/2015 0850   VLDL 43.6* 02/14/2015 0850   LDLCALC 106* 01/03/2014 0939   LDLDIRECT 105.0 02/14/2015 0850      Wt Readings from Last 3 Encounters:  02/22/15 169 lb 6.4 oz (76.839 kg)  02/14/15 169 lb 11.2 oz (76.975 kg)  01/24/15 170 lb 14.4 oz (77.52 kg)      ASSESSMENT AND PLAN:  1  CAD  I am not  convinced any active ischemia  No chagne in regimen  2.  HL  Needs tighter control of lipids Needs to check on pricing for Zetia    3  Ortho  Pt should stop plavix 5 days prior  Resume after      Disposition:   FU with me next year    Signed, Dorris Carnes, MD  02/22/2015 9:19 AM    [facility] [address], [place], [place]  [address] Phone: [phone]; Fax: [phone]

## 2015-02-22 NOTE — Patient Instructions (Signed)
Your physician recommends that you continue on your current medications as directed. Please refer to the Current Medication list given to you today. --OK TO STOP PLAVIX PRIOR TO CARPEL TUNNEL SURGERY; RESTART AFTER--  Your physician wants you to follow-up in: [place].   You will receive a reminder letter in the mail two months in advance. If you don't receive a letter, please call our office to schedule the follow-up appointment.  PER DR ROSS-PLEASE CHECK ON THE COPAYMENT OF ZETIA (CALL YOUR INSURANCE COMPANY) DR ROSS RECOMMENDS THAT YOU TAKE ZETIA 5 MG DAILY.

## 2015-02-26 ENCOUNTER — Other Ambulatory Visit: Payer: Self-pay | Admitting: Adult Health

## 2015-02-26 ENCOUNTER — Telehealth: Payer: Self-pay | Admitting: Adult Health

## 2015-02-26 DIAGNOSIS — F5104 Psychophysiologic insomnia: Secondary | ICD-10-CM

## 2015-02-26 MED ORDER — ZOLPIDEM TARTRATE 10 MG PO TABS
10.0000 mg | ORAL_TABLET | Freq: Every evening | ORAL | Status: DC | PRN
Start: 2015-02-26 — End: 2015-03-28

## 2015-02-26 NOTE — Telephone Encounter (Signed)
Ok to fill. Zolpidem 10 mg #30, No refills.

## 2015-02-26 NOTE — Telephone Encounter (Signed)
Pt can not afford belsomra 5 mg cost is around 10 dollars a pill. Pt would like to go back to generic ambien 10 mg [facility] [address]

## 2015-02-26 NOTE — Telephone Encounter (Signed)
Rx called in 

## 2015-03-05 ENCOUNTER — Other Ambulatory Visit: Payer: Self-pay | Admitting: Family

## 2015-03-08 ENCOUNTER — Ambulatory Visit (INDEPENDENT_AMBULATORY_CARE_PROVIDER_SITE_OTHER): Payer: Managed Care, Other (non HMO)

## 2015-03-08 DIAGNOSIS — E538 Deficiency of other specified B group vitamins: Secondary | ICD-10-CM

## 2015-03-08 MED ORDER — CYANOCOBALAMIN 1000 MCG/ML IJ SOLN
1000.0000 ug | Freq: Once | INTRAMUSCULAR | Status: AC
  Administered 2015-03-08: 1000 ug via INTRAMUSCULAR

## 2015-03-22 ENCOUNTER — Telehealth: Payer: Self-pay | Admitting: Adult Health

## 2015-03-22 DIAGNOSIS — M5440 Lumbago with sciatica, unspecified side: Secondary | ICD-10-CM

## 2015-03-22 MED ORDER — TRAMADOL HCL 50 MG PO TABS: 50.0000 mg | ORAL_TABLET | Freq: Four times a day (QID) | ORAL | Status: DC | PRN

## 2015-03-22 NOTE — Telephone Encounter (Signed)
Pt needs new rx tramadol 50 mg #180 instead of 120. Pt is taking med three times a day. [facility] [address]

## 2015-03-22 NOTE — Telephone Encounter (Signed)
Per Tommi Rumps #120 is correct.  Per last office note pt states she was cutting down on her Tramadol use.  Called and spoke with pt and pt states she had a bad week last week with her back. Pt states at her last appt she told Tommi Rumps that she would be having an upcoming surgery on 8.23.2016 and that she would be taking Tramadol 50 mg tid.    Per Tommi Rumps ok for pt to have Tramadol 50 mg #180 this time but orthopedics or a pain clinic will have to take over pt's pain management.  Pt verbalized understanding.  Rx called in to pharamacy.

## 2015-03-29 ENCOUNTER — Ambulatory Visit (INDEPENDENT_AMBULATORY_CARE_PROVIDER_SITE_OTHER): Payer: Managed Care, Other (non HMO) | Admitting: Adult Health

## 2015-03-29 ENCOUNTER — Encounter: Payer: Self-pay | Admitting: Adult Health

## 2015-03-29 VITALS — BP 102/68 | Temp 98.1°F | Ht 61.5 in | Wt 167.9 lb

## 2015-03-29 DIAGNOSIS — N898 Other specified noninflammatory disorders of vagina: Secondary | ICD-10-CM

## 2015-03-29 DIAGNOSIS — R300 Dysuria: Secondary | ICD-10-CM | POA: Diagnosis not present

## 2015-03-29 DIAGNOSIS — L298 Other pruritus: Secondary | ICD-10-CM

## 2015-03-29 LAB — POCT URINALYSIS DIPSTICK
Bilirubin, UA: NEGATIVE
GLUCOSE UA: NEGATIVE
Ketones, UA: NEGATIVE
LEUKOCYTES UA: NEGATIVE
Nitrite, UA: NEGATIVE
Protein, UA: NEGATIVE
SPEC GRAV UA: 1.01
UROBILINOGEN UA: 0.2
pH, UA: 6

## 2015-03-29 NOTE — Progress Notes (Signed)
Subjective:    Patient ID: Bianca [name], female    DOB: 10-30-1945, 69 y.o.   MRN: [id number]  HPI 69 year old female who presents to the office today with two complaints.   1) Complains of vaginal burning for the last two days. The "burning" " comes and goes". She denies any irritation or discharge. She does endorse vague UTI type symptoms, burning with urination and frequency. Denies fever, nausea, vomiting, diarrhea. She denies vaginal discharge or odor. No unsafe sex or new partner. She has not tried anything over-the-counter for symptom relief.  2) she also has the complaint of generalized fatigue and sinus pain and pressure. This has been going on for at least 5 days.this is accompanied by sore throat, she does not have any trouble swallowing. She denies any fever nausea vomiting or diarrhea. Has not tried anything over-the-counter for symptom relief   Review of Systems  Constitutional: Positive for fatigue. Negative for fever, chills, diaphoresis and unexpected weight change.  HENT: Positive for sinus pressure and sore throat. Negative for ear discharge, ear pain, postnasal drip, rhinorrhea, tinnitus, trouble swallowing and voice change.   Eyes: Negative.   Respiratory: Negative for cough, chest tightness and shortness of breath.   Cardiovascular: Negative.   Gastrointestinal: Negative.  Negative for nausea, vomiting, diarrhea, constipation and rectal pain.  Genitourinary: Positive for dysuria and frequency. Negative for urgency, decreased urine volume and difficulty urinating.  Musculoskeletal: Negative.   Skin: Negative.   Neurological: Negative.   All other systems reviewed and are negative.  Past Medical History  Diagnosis Date  . CAD (coronary artery disease)     a. 1997 MI/PCI RCA;  b. 2007 MI/PCI of 100% RCA with Taxus DES x 3 placed, EF 55%;  c. 2010 Cath: stable anatomy;  d. 05/2012 NSTEMI/Cath/PCI: LM nl, LAD 60-79m, D1 20ost, LCX 13m, RCA 40-74m ISR, 60/95d (Treated  w/ 2.75x33 Xience Xpedition DES), PDA 93m (Treated w/ PTCA), EF 60%.  . Depression   . Hyperlipidemia   . Carpal tunnel syndrome on both sides   . Numbness of foot left foot    "never recovered from last back OR, 2009" (05/17/2012)  . GERD (gastroesophageal reflux disease)   . Arthritis     "back; probably knees" (05/17/2012)  . Chronic lower back pain   . Anginal pain   . Myocardial infarction  1997, 2007 , last 2013    x 3    Social History   Social History  . Marital Status: Married    Spouse Name: N/A  . Number of Children: N/A  . Years of Education: N/A   Occupational History  . Not on file.   Social History Main Topics  . Smoking status: Current Every Day Smoker -- 0.50 packs/day for 50 years    Types: Cigarettes  . Smokeless tobacco: Never Used  . Alcohol Use: No  . Drug Use: No  . Sexual Activity: Not Currently   Other Topics Concern  . Not on file   Social History Narrative   Is a homemaker   Married for 69 years    Has a daughter and son    Pets: Two dogs and a Neurosurgeon   Likes to garden ( flower beds).           Past Surgical History  Procedure Laterality Date  . Cesarean section  1990  . Ankle surgery  years ago    left; "had to take out a floater"  . Posterior fusion  lumbar spine  1962; 2009  . Coronary angioplasty with stent placement  1997; 2007, 2013    "2 + 2; + 1= total of 5  . 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 Left heart catheterization with coronary angiogram N/A 05/18/2012    Procedure: LEFT HEART CATHETERIZATION WITH CORONARY ANGIOGRAM;  Surgeon: Wellington Hampshire, MD;  Location: [facility];  Service: Cardiovascular;  Laterality: N/A;    Family History  Problem Relation Age of Onset  . Coronary artery disease Mother   . Diabetes Mother   . Hypertension Mother   . Dementia  Father   . Sudden death Brother     suicide  . Cancer Paternal Grandfather     esophageal    Allergies  Allergen Reactions  . Oxycodone Hcl Itching and Other (See Comments)    confusion  . Penicillins Rash  . Macrobid [Nitrofurantoin Monohyd Macro] Nausea And Vomiting  . Sulfa Antibiotics Nausea Only    Current Outpatient Prescriptions on File Prior to Visit  Medication Sig Dispense Refill  . albuterol (PROVENTIL HFA;VENTOLIN HFA) 108 (90 BASE) MCG/ACT inhaler Inhale 2 puffs into the lungs every 6 (six) hours as needed for wheezing or shortness of breath. 1 Inhaler 2  . clopidogrel (PLAVIX) 75 MG tablet TAKE 1 TABLET BY MOUTH ONCE DAILY 30 tablet 2  . escitalopram (LEXAPRO) 20 MG tablet Take 1 tablet (20 mg total) by mouth daily. 90 tablet 0  . ferrous sulfate (FERROUSUL) 325 (65 FE) MG tablet Take 1 tablet (325 mg total) by mouth 2 (two) times daily with a meal. 42 tablet 0  . fluticasone (FLONASE) 50 MCG/ACT nasal spray Place 2 sprays into both nostrils daily. (Patient taking differently: Place 2 sprays into both nostrils as needed for allergies. ) 16 g 0  . metoprolol (LOPRESSOR) 50 MG tablet TAKE 1/2 TABLETS (25 MG TOTAL) BY MOUTH 2 (TWO) TIMES DAILY. 90 tablet 1  . nitroGLYCERIN (NITROSTAT) 0.4 MG SL tablet Place 1 tablet (0.4 mg total) under the tongue every 5 (five) minutes as needed for chest pain. 25 tablet 3  . pantoprazole (PROTONIX) 40 MG tablet Take 1 tablet (40 mg total) by mouth daily. 90 tablet 3  . simvastatin (ZOCOR) 80 MG tablet Take 1 tablet (80 mg total) by mouth at bedtime. 30 tablet 5  . Suvorexant (BELSOMRA) 10 MG TABS Take 10 mg by mouth Nightly. 10 tablet 0  . Suvorexant (BELSOMRA) 15 MG TABS Take 15 mg by mouth Nightly. 10 tablet 0  . Suvorexant (BELSOMRA) 20 MG TABS Take 20 mg by mouth Nightly. 10 tablet 0  . traMADol (ULTRAM) 50 MG tablet Take 1 tablet (50 mg total) by mouth every 6 (six) hours as needed. 180 tablet 0   No current facility-administered  medications on file prior to visit.    BP 102/68 mmHg  Temp(Src) 98.1 F (36.7 C) (Oral)  Ht 5' 1.5" (1.562 m)  Wt 167 lb 14.4 oz (76.159 kg)  BMI 31.21 kg/m2       Objective:   Physical Exam  Constitutional: She is oriented to person, place, and time. She appears well-developed and well-nourished. No distress.  HENT:  Head: Normocephalic and atraumatic.  Right Ear: External ear normal.  Left Ear: External ear normal.  Nose: Nose normal.  Mouth/Throat: Oropharynx is clear and moist. No oropharyngeal exudate.  Eyes: Conjunctivae are normal. Pupils are equal, round, and reactive to light. Right eye exhibits no discharge. Left eye exhibits no discharge.  Neck: Normal range of motion. Neck supple. No tracheal deviation present. No thyromegaly present.  Cardiovascular: Normal rate, regular rhythm, normal heart sounds and intact distal pulses.  Exam reveals no gallop and no friction rub.   No murmur heard. Pulmonary/Chest: Effort normal and breath sounds normal. No respiratory distress. She has no wheezes. She has no rales. She exhibits no tenderness.  Abdominal: Soft. Bowel sounds are normal. She exhibits no distension and no mass. There is no tenderness. There is no rebound and no guarding.  Genitourinary: Vagina normal. No vaginal discharge found.  No redness, irritation, odor, or discharge noted during pelvic exam.   Musculoskeletal: Normal range of motion.  Lymphadenopathy:    She has no cervical adenopathy.  Neurological: She is alert and oriented to person, place, and time. No cranial nerve deficit. Coordination normal.  Skin: Skin is warm and dry. No rash noted. No erythema. No pallor.  Psychiatric: She has a normal mood and affect. Her behavior is normal. Judgment and thought content normal.  Nursing note and vitals reviewed.      Assessment & Plan:  1. Dysuria - POCT urinalysis dipstick- negative - Advised to try OTC Azo  2. Vaginal itching - No signs of yeast  infection or STD - Advised GYN follow up for possible estrogen cream.  - otc vaginal itching cream

## 2015-03-29 NOTE — Progress Notes (Signed)
Pre visit review using our clinic review tool, if applicable. No additional management support is needed unless otherwise documented below in the visit note. 

## 2015-03-29 NOTE — Patient Instructions (Signed)
It appears as though you have a viral upper respiratory infection. Please get plenty of rest and drink plenty of fluid.   You can try over the counter vaginal itch cream, which may help with the burning. Please follow up with GYN.   If you do not notice any improvement by middle of next week, please let me know.

## 2015-04-04 ENCOUNTER — Other Ambulatory Visit: Payer: Self-pay | Admitting: Adult Health

## 2015-04-04 NOTE — Telephone Encounter (Signed)
Pt switched to belsomra correct?

## 2015-04-05 NOTE — Telephone Encounter (Signed)
Ok to refill 

## 2015-04-22 ENCOUNTER — Other Ambulatory Visit: Payer: Self-pay | Admitting: Internal Medicine

## 2015-05-07 ENCOUNTER — Other Ambulatory Visit: Payer: Self-pay | Admitting: Adult Health

## 2015-05-07 MED ORDER — ZOLPIDEM TARTRATE 5 MG PO TABS: 5.0000 mg | ORAL_TABLET | Freq: Every evening | ORAL | Status: DC | PRN

## 2015-05-07 NOTE — Telephone Encounter (Signed)
Changed from 10 mg to 5 mg Ambien, per guidelines. Patient is also tapering off this medication

## 2015-05-07 NOTE — Telephone Encounter (Signed)
Refill on Ambien. 

## 2015-05-08 ENCOUNTER — Other Ambulatory Visit: Payer: Self-pay | Admitting: Adult Health

## 2015-05-10 ENCOUNTER — Ambulatory Visit (INDEPENDENT_AMBULATORY_CARE_PROVIDER_SITE_OTHER): Payer: Managed Care, Other (non HMO)

## 2015-05-10 DIAGNOSIS — D539 Nutritional anemia, unspecified: Secondary | ICD-10-CM | POA: Diagnosis not present

## 2015-05-10 MED ORDER — CYANOCOBALAMIN 1000 MCG/ML IJ SOLN: 1000.0000 ug | Freq: Once | INTRAMUSCULAR | Status: DC

## 2015-05-10 MED ORDER — CYANOCOBALAMIN 1000 MCG/ML IJ SOLN
1000.0000 ug | Freq: Once | INTRAMUSCULAR | Status: AC
  Administered 2015-05-10: 1000 ug via INTRAMUSCULAR

## 2015-05-10 NOTE — Progress Notes (Signed)
   Subjective:    Patient ID: Bianca [name], female    DOB: 08-05-1946, 69 y.o.   MRN: [id number]  HPI    Review of Systems     Objective:   Physical Exam        Assessment & Plan:

## 2015-05-14 ENCOUNTER — Telehealth: Payer: Self-pay | Admitting: Adult Health

## 2015-05-14 NOTE — Telephone Encounter (Signed)
Patient thinks she has a UTI and would like to come in tomorrow for a UA. Would you like her to have an appointment first?

## 2015-05-14 NOTE — Telephone Encounter (Signed)
Per Tommi Rumps, pt needs to schedule an appointment with him for evaluation before ua can be ordered.

## 2015-05-15 ENCOUNTER — Encounter: Payer: Self-pay | Admitting: Adult Health

## 2015-05-15 ENCOUNTER — Ambulatory Visit (INDEPENDENT_AMBULATORY_CARE_PROVIDER_SITE_OTHER): Payer: Managed Care, Other (non HMO) | Admitting: Adult Health

## 2015-05-15 VITALS — BP 120/70 | HR 56 | Temp 98.3°F | Ht 61.0 in | Wt 169.0 lb

## 2015-05-15 DIAGNOSIS — N390 Urinary tract infection, site not specified: Secondary | ICD-10-CM | POA: Diagnosis not present

## 2015-05-15 DIAGNOSIS — R350 Frequency of micturition: Secondary | ICD-10-CM | POA: Diagnosis not present

## 2015-05-15 DIAGNOSIS — R319 Hematuria, unspecified: Secondary | ICD-10-CM | POA: Diagnosis not present

## 2015-05-15 LAB — POCT URINALYSIS DIPSTICK
Bilirubin, UA: NEGATIVE
Glucose, UA: NEGATIVE
KETONES UA: NEGATIVE
Nitrite, UA: NEGATIVE
PROTEIN UA: NEGATIVE
Spec Grav, UA: 1.015
Urobilinogen, UA: 0.2
pH, UA: 6

## 2015-05-15 MED ORDER — PHENAZOPYRIDINE HCL 100 MG PO TABS: 100.0000 mg | ORAL_TABLET | Freq: Three times a day (TID) | ORAL | Status: DC | PRN

## 2015-05-15 MED ORDER — CIPROFLOXACIN HCL 500 MG PO TABS: 500.0000 mg | ORAL_TABLET | Freq: Two times a day (BID) | ORAL | Status: DC

## 2015-05-15 NOTE — Addendum Note (Signed)
Addended by: Elmer Picker on: 05/15/2015 08:50 AM   Modules accepted: Orders

## 2015-05-15 NOTE — Progress Notes (Signed)
Pre visit review using our clinic review tool, if applicable. No additional management support is needed unless otherwise documented below in the visit note. 

## 2015-05-15 NOTE — Patient Instructions (Addendum)
It was great seeing you again.   Take the Cipro twice a day for three days a week.  Use the Pyridium as needed.   Continue to drink plenty of fluid.    Urinary Tract Infection Urinary tract infections (UTIs) can develop anywhere along your urinary tract. Your urinary tract is your body's drainage system for removing wastes and extra water. Your urinary tract includes two kidneys, two ureters, a bladder, and a urethra. Your kidneys are a pair of bean-shaped organs. Each kidney is about the size of your fist. They are located below your ribs, one on each side of your spine. CAUSES Infections are caused by microbes, which are microscopic organisms, including fungi, viruses, and bacteria. These organisms are so small that they can only be seen through a microscope. Bacteria are the microbes that most commonly cause UTIs. SYMPTOMS  Symptoms of UTIs may vary by age and gender of the patient and by the location of the infection. Symptoms in Bianca [name] women typically include a frequent and intense urge to urinate and a painful, burning feeling in the bladder or urethra during urination. Older women and men are more likely to be tired, shaky, and weak and have muscle aches and abdominal pain. A fever may mean the infection is in your kidneys. Other symptoms of a kidney infection include pain in your back or sides below the ribs, nausea, and vomiting. DIAGNOSIS To diagnose a UTI, your caregiver will ask you about your symptoms. Your caregiver also will ask to provide a urine sample. The urine sample will be tested for bacteria and white blood cells. White blood cells are made by your body to help fight infection. TREATMENT  Typically, UTIs can be treated with medication. Because most UTIs are caused by a bacterial infection, they usually can be treated with the use of antibiotics. The choice of antibiotic and length of treatment depend on your symptoms and the type of bacteria causing your infection. HOME CARE  INSTRUCTIONS  If you were prescribed antibiotics, take them exactly as your caregiver instructs you. Finish the medication even if you feel better after you have only taken some of the medication.  Drink enough water and fluids to keep your urine clear or pale yellow.  Avoid caffeine, tea, and carbonated beverages. They tend to irritate your bladder.  Empty your bladder often. Avoid holding urine for long periods of time.  Empty your bladder before and after sexual intercourse.  After a bowel movement, women should cleanse from front to back. Use each tissue only once. SEEK MEDICAL CARE IF:   You have back pain.  You develop a fever.  Your symptoms do not begin to resolve within 3 days. SEEK IMMEDIATE MEDICAL CARE IF:   You have severe back pain or lower abdominal pain.  You develop chills.  You have nausea or vomiting.  You have continued burning or discomfort with urination. MAKE SURE YOU:   Understand these instructions.  Will watch your condition.  Will get help right away if you are not doing well or get worse. Document Released: 05/14/2005 Document Revised: 02/03/2012 Document Reviewed: 09/12/2011 [facility] Patient Information 2015 [place], [place]. This information is not intended to replace advice given to you by your health care provider. Make sure you discuss any questions you have with your health care provider.

## 2015-05-15 NOTE — Progress Notes (Signed)
Subjective:    Patient ID: Bianca [name], female    DOB: Jan 16, 1946, 69 y.o.   MRN: [id number]  HPI  69 year old female who presents to the office today with UTI type symptoms. Her symptoms include frequent urination and burning after urination with back pain for less then a week.   Denies fever, difficulty urinating or hematuria, nausea, vomiting, diarrhea.   Review of Systems  Constitutional: Negative.   HENT: Negative.   Genitourinary: Positive for dysuria, frequency and flank pain. Negative for hematuria and difficulty urinating.  Musculoskeletal: Negative.   Neurological: Negative.    Past Medical History  Diagnosis Date  . CAD (coronary artery disease)     a. 1997 MI/PCI RCA;  b. 2007 MI/PCI of 100% RCA with Taxus DES x 3 placed, EF 55%;  c. 2010 Cath: stable anatomy;  d. 05/2012 NSTEMI/Cath/PCI: LM nl, LAD 60-98m, D1 20ost, LCX 66m, RCA 40-74m ISR, 60/95d (Treated w/ 2.75x33 Xience Xpedition DES), PDA 48m (Treated w/ PTCA), EF 60%.  . Depression   . Hyperlipidemia   . Carpal tunnel syndrome on both sides   . Numbness of foot left foot    "never recovered from last back OR, 2009" (05/17/2012)  . GERD (gastroesophageal reflux disease)   . Arthritis     "back; probably knees" (05/17/2012)  . Chronic lower back pain   . Anginal pain   . Myocardial infarction  1997, 2007 , last 2013    x 3    Social History   Social History  . Marital Status: Married    Spouse Name: N/A  . Number of Children: N/A  . Years of Education: N/A   Occupational History  . Not on file.   Social History Main Topics  . Smoking status: Current Every Day Smoker -- 0.50 packs/day for 50 years    Types: Cigarettes  . Smokeless tobacco: Never Used  . Alcohol Use: No  . Drug Use: No  . Sexual Activity: Not Currently   Other Topics Concern  . Not on file   Social History Narrative   Is a homemaker   Married for 62 years    Has a daughter and son    Pets: Two dogs and a Neurosurgeon   Likes to  garden ( flower beds).           Past Surgical History  Procedure Laterality Date  . Cesarean section  1990  . Ankle surgery  years ago    left; "had to take out a floater"  . Posterior fusion lumbar spine  1962; 2009  . Coronary angioplasty with stent placement  1997; 2007, 2013    "2 + 2; + 1= total of 5  . 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 Left heart catheterization with coronary angiogram N/A 05/18/2012    Procedure: LEFT HEART CATHETERIZATION WITH CORONARY ANGIOGRAM;  Surgeon: Wellington Hampshire, MD;  Location: [facility];  Service: Cardiovascular;  Laterality: N/A;    Family History  Problem Relation Age of Onset  . Coronary artery disease Mother   . Diabetes Mother   . Hypertension Mother   . Dementia Father   . Sudden death Brother     suicide  . Cancer Paternal Grandfather     esophageal    Allergies  Allergen Reactions  . Oxycodone Hcl  Itching and Other (See Comments)    confusion  . Penicillins Rash  . Macrobid [Nitrofurantoin Monohyd Macro] Nausea And Vomiting  . Sulfa Antibiotics Nausea Only    Current Outpatient Prescriptions on File Prior to Visit  Medication Sig Dispense Refill  . albuterol (PROVENTIL HFA;VENTOLIN HFA) 108 (90 BASE) MCG/ACT inhaler Inhale 2 puffs into the lungs every 6 (six) hours as needed for wheezing or shortness of breath. (Patient not taking: Reported on 05/15/2015) 1 Inhaler 2  . clopidogrel (PLAVIX) 75 MG tablet TAKE 1 TABLET BY MOUTH ONCE DAILY 30 tablet 6  . escitalopram (LEXAPRO) 20 MG tablet Take 1 tablet (20 mg total) by mouth daily. 90 tablet 0  . fluticasone (FLONASE) 50 MCG/ACT nasal spray Place 2 sprays into both nostrils daily. (Patient taking differently: Place 2 sprays into both nostrils as needed for allergies. ) 16 g 0  . metoprolol (LOPRESSOR) 50 MG tablet  TAKE 1/2 TABLETS (25 MG TOTAL) BY MOUTH 2 (TWO) TIMES DAILY. 90 tablet 1  . nitroGLYCERIN (NITROSTAT) 0.4 MG SL tablet Place 1 tablet (0.4 mg total) under the tongue every 5 (five) minutes as needed for chest pain. 25 tablet 3  . pantoprazole (PROTONIX) 40 MG tablet Take 1 tablet (40 mg total) by mouth daily. 90 tablet 3  . simvastatin (ZOCOR) 80 MG tablet Take 1 tablet (80 mg total) by mouth at bedtime. 30 tablet 5  . traMADol (ULTRAM) 50 MG tablet Take 1 tablet (50 mg total) by mouth every 6 (six) hours as needed. 180 tablet 0  . zolpidem (AMBIEN) 5 MG tablet Take 1 tablet (5 mg total) by mouth at bedtime as needed. for sleep 30 tablet 0   No current facility-administered medications on file prior to visit.    BP 120/70 mmHg  Pulse 56  Temp(Src) 98.3 F (36.8 C) (Oral)  Ht 5\' 1"  (1.549 m)  Wt 169 lb (76.658 kg)  BMI 31.95 kg/m2  SpO2 97%       Objective:   Physical Exam  Constitutional: She is oriented to person, place, and time. She appears well-developed and well-nourished. No distress.  Cardiovascular: Normal rate, regular rhythm, normal heart sounds and intact distal pulses.  Exam reveals no gallop.   No murmur heard. Pulmonary/Chest: Effort normal and breath sounds normal. No respiratory distress. She has no wheezes. She has no rales. She exhibits no tenderness.  Abdominal: Soft. Bowel sounds are normal. She exhibits no distension and no mass. There is no tenderness. There is no rebound and no guarding.  Musculoskeletal: Normal range of motion. She exhibits no edema or tenderness.  No CVA tenderness  Lymphadenopathy:    She has no cervical adenopathy.  Neurological: She is alert and oriented to person, place, and time.  Skin: Skin is warm and dry. No rash noted. She is not diaphoretic. No erythema. No pallor.  Psychiatric: She has a normal mood and affect. Her behavior is normal. Judgment and thought content normal.  Nursing note and vitals reviewed.      Assessment &  Plan:  1. Frequent urination - POCT urinalysis dipstick  2. Urinary tract infection with hematuria, site unspecified - ciprofloxacin (CIPRO) 500 MG tablet; Take 1 tablet (500 mg total) by mouth 2 (two) times daily.  Dispense: 6 tablet; Refill: 0 - phenazopyridine (PYRIDIUM) 100 MG tablet; Take 1 tablet (100 mg total) by mouth 3 (three) times daily as needed for pain.  Dispense: 10 tablet; Refill: 0 - Follow up if no improvement in the next  2-3 days.  - Drink plenty fluids.

## 2015-05-18 LAB — URINE CULTURE

## 2015-05-22 ENCOUNTER — Telehealth: Payer: Self-pay | Admitting: Adult Health

## 2015-05-22 DIAGNOSIS — M5440 Lumbago with sciatica, unspecified side: Secondary | ICD-10-CM

## 2015-05-22 NOTE — Telephone Encounter (Signed)
Pt request refill traMADol (ULTRAM) 50 MG tablet [[id number]] [facility]/ [address]

## 2015-05-22 NOTE — Telephone Encounter (Signed)
Rx was sent on 8.4.2016 #180 with 0 Rf. Ok to refill?

## 2015-05-23 MED ORDER — TRAMADOL HCL 50 MG PO TABS: 50.0000 mg | ORAL_TABLET | Freq: Four times a day (QID) | ORAL | Status: DC | PRN

## 2015-05-23 NOTE — Telephone Encounter (Signed)
Rx called in to pharmacy. 

## 2015-05-23 NOTE — Telephone Encounter (Signed)
Ok to refill 

## 2015-05-24 ENCOUNTER — Telehealth: Payer: Self-pay | Admitting: Adult Health

## 2015-05-24 NOTE — Telephone Encounter (Signed)
Called the pharmacy and spoke with Will.  On 9.2.2016 a prescription for B12 was sent to pharmacy.  This was probably by accident because on the same day pt received a B12 injection. Please advise if pt should follow up with [place] or a referral is needed.

## 2015-05-24 NOTE — Telephone Encounter (Signed)
.  Pt said she is still having vagina burning and has been taking the following medicine and is asking if she need another refill  ciprofloxacin (CIPRO) 500 MG tablet  Pt said she also receive  a vial for b12 injection and is asking why she received this when she always come to the office and get her b12 injections. Would like a call back about this  Bianca [name]

## 2015-05-24 NOTE — Telephone Encounter (Signed)
If she would like to come back in and be evaluated and give another urine sample that would be ok. If she is having vaginal burning then we should refer to GYN as she may need to have hormone therapy.   She can continue to follow up with [place] for the B12. We need to draw a level on her

## 2015-05-25 NOTE — Telephone Encounter (Signed)
Called and spoke with pt and pt is aware of Cory's recommendations.  Appt made for Monday and pt is aware.

## 2015-05-28 ENCOUNTER — Ambulatory Visit (INDEPENDENT_AMBULATORY_CARE_PROVIDER_SITE_OTHER): Payer: Managed Care, Other (non HMO) | Admitting: Adult Health

## 2015-05-28 ENCOUNTER — Encounter: Payer: Self-pay | Admitting: Adult Health

## 2015-05-28 VITALS — BP 128/80 | Temp 98.1°F

## 2015-05-28 DIAGNOSIS — N949 Unspecified condition associated with female genital organs and menstrual cycle: Secondary | ICD-10-CM

## 2015-05-28 DIAGNOSIS — R300 Dysuria: Secondary | ICD-10-CM

## 2015-05-28 LAB — POCT URINALYSIS DIPSTICK
Bilirubin, UA: NEGATIVE
GLUCOSE UA: NEGATIVE
Ketones, UA: NEGATIVE
Leukocytes, UA: NEGATIVE
Nitrite, UA: NEGATIVE
Protein, UA: NEGATIVE
UROBILINOGEN UA: 0.2
pH, UA: 6

## 2015-05-28 NOTE — Progress Notes (Signed)
Subjective:    Patient ID: Bianca [name], female    DOB: [birth year], 69 y.o.   MRN: [id number]  HPI  69 year old female who presents to the office today for continued urinary and vaginal complaints. I last saw her on 05/15/2015 with UTI type symptoms of frequent urination, burning with urination and back pain. She was + for UTI and was prescribed Cipro 500mg  BID x 3 days. Prior to that I saw her on 03/29/2015 with complaints of vaginal burning that "comes and goes". She denied any vaginal discharge or irritation. She has not had any new sexual partners. Her pelvic exam was normal at that time.   Today she endorses that she continues to have vaginal burning but that it is not as bad.  She also continues to have "alittle bit of burning with urination."   She denies any vaginal discharge, hematuria, fevers or pelvic pain. She does not have a GYN.   Review of Systems  Constitutional: Negative.   HENT: Negative.   Respiratory: Negative.   Cardiovascular: Negative.   Genitourinary: Positive for dysuria and vaginal pain (irritatation). Negative for frequency, hematuria, flank pain, vaginal bleeding, vaginal discharge and difficulty urinating.  Neurological: Negative.    Past Medical History  Diagnosis Date  . CAD (coronary artery disease)     a. 1997 MI/PCI RCA;  b. 2007 MI/PCI of 100% RCA with Taxus DES x 3 placed, EF 55%;  c. 2010 Cath: stable anatomy;  d. 05/2012 NSTEMI/Cath/PCI: LM nl, LAD 60-26m, D1 20ost, LCX 84m, RCA 40-93m ISR, 60/95d (Treated w/ 2.75x33 Xience Xpedition DES), PDA 32m (Treated w/ PTCA), EF 60%.  . Depression   . Hyperlipidemia   . Carpal tunnel syndrome on both sides   . Numbness of foot left foot    "never recovered from last back OR, 2009" (05/17/2012)  . GERD (gastroesophageal reflux disease)   . Arthritis     "back; probably knees" (05/17/2012)  . Chronic lower back pain   . Anginal pain   . Myocardial infarction  1997, 2007 , last 2013    x 3    Social  History   Social History  . Marital Status: Married    Spouse Name: N/A  . Number of Children: N/A  . Years of Education: N/A   Occupational History  . Not on file.   Social History Main Topics  . Smoking status: Current Every Day Smoker -- 0.50 packs/day for 50 years    Types: Cigarettes  . Smokeless tobacco: Never Used  . Alcohol Use: No  . Drug Use: No  . Sexual Activity: Not Currently   Other Topics Concern  . Not on file   Social History Narrative   Is a homemaker   Married for 81 years    Has a daughter and son    Pets: Two dogs and a Neurosurgeon   Likes to garden ( flower beds).           Past Surgical History  Procedure Laterality Date  . Cesarean section  1990  . Ankle surgery  years ago    left; "had to take out a floater"  . Posterior fusion lumbar spine  1962; 2009  . Coronary angioplasty with stent placement  1997; 2007, 2013    "2 + 2; + 1= total of 5  . 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 Left heart catheterization with coronary angiogram N/A 05/18/2012    Procedure: LEFT HEART CATHETERIZATION WITH CORONARY ANGIOGRAM;  Surgeon: Wellington Hampshire, MD;  Location: [place] [facility];  Service: Cardiovascular;  Laterality: N/A;    Family History  Problem Relation Age of Onset  . Coronary artery disease Mother   . Diabetes Mother   . Hypertension Mother   . Dementia Father   . Sudden death Brother     suicide  . Cancer Paternal Grandfather     esophageal    Allergies  Allergen Reactions  . Oxycodone Hcl Itching and Other (See Comments)    confusion  . Penicillins Rash  . Macrobid [Nitrofurantoin Monohyd Macro] Nausea And Vomiting  . Sulfa Antibiotics Nausea Only    Current Outpatient Prescriptions on File Prior to Visit  Medication Sig Dispense Refill  . albuterol (PROVENTIL HFA;VENTOLIN HFA) 108  (90 BASE) MCG/ACT inhaler Inhale 2 puffs into the lungs every 6 (six) hours as needed for wheezing or shortness of breath. 1 Inhaler 2  . clopidogrel (PLAVIX) 75 MG tablet TAKE 1 TABLET BY MOUTH ONCE DAILY 30 tablet 6  . escitalopram (LEXAPRO) 20 MG tablet Take 1 tablet (20 mg total) by mouth daily. 90 tablet 0  . fluticasone (FLONASE) 50 MCG/ACT nasal spray Place 2 sprays into both nostrils daily. (Patient taking differently: Place 2 sprays into both nostrils as needed for allergies. ) 16 g 0  . metoprolol (LOPRESSOR) 50 MG tablet TAKE 1/2 TABLETS (25 MG TOTAL) BY MOUTH 2 (TWO) TIMES DAILY. 90 tablet 1  . nitroGLYCERIN (NITROSTAT) 0.4 MG SL tablet Place 1 tablet (0.4 mg total) under the tongue every 5 (five) minutes as needed for chest pain. 25 tablet 3  . pantoprazole (PROTONIX) 40 MG tablet Take 1 tablet (40 mg total) by mouth daily. 90 tablet 3  . phenazopyridine (PYRIDIUM) 100 MG tablet Take 1 tablet (100 mg total) by mouth 3 (three) times daily as needed for pain. 10 tablet 0  . simvastatin (ZOCOR) 80 MG tablet Take 1 tablet (80 mg total) by mouth at bedtime. 30 tablet 5  . traMADol (ULTRAM) 50 MG tablet Take 1 tablet (50 mg total) by mouth every 6 (six) hours as needed. 180 tablet 0  . zolpidem (AMBIEN) 5 MG tablet Take 1 tablet (5 mg total) by mouth at bedtime as needed. for sleep 30 tablet 0   No current facility-administered medications on file prior to visit.    BP 128/80 mmHg  Temp(Src) 98.1 F (36.7 C) (Oral)       Objective:   Physical Exam  Constitutional: She is oriented to person, place, and time. She appears well-developed and well-nourished. No distress.  Cardiovascular: Normal rate, regular rhythm, normal heart sounds and intact distal pulses.  Exam reveals no friction rub.   No murmur heard. Pulmonary/Chest: Effort normal and breath sounds normal. No respiratory distress. She has no wheezes. She has no rales. She exhibits no tenderness.  Abdominal: Soft. Bowel sounds  are normal. She exhibits no distension and no mass. There is no tenderness. There is no rebound and no guarding.  Genitourinary:  Deferred  Musculoskeletal: Normal range of motion. She exhibits no tenderness.  Neurological: She is alert and oriented to person, place, and time.  Skin: Skin is warm and dry. No rash noted. She is not diaphoretic. No erythema. No pallor.  Psychiatric: She has a normal mood and affect. Her behavior is normal. Thought content normal.  Nursing note and vitals reviewed.      Assessment & Plan:  1. Dysuria - POCT urinalysis dipstick; Standing - POCT urinalysis dipstick- Negative  2. Vaginal burning - Likely from vaginal dryness. Information given on local GYNs to see about HRT.  - we have recently done a pelvic exam and nothing has changed, I do not think we need to do another exam at this time.

## 2015-05-28 NOTE — Progress Notes (Signed)
Pre visit review using our clinic review tool, if applicable. No additional management support is needed unless otherwise documented below in the visit note. 

## 2015-06-01 ENCOUNTER — Other Ambulatory Visit: Payer: Self-pay | Admitting: Adult Health

## 2015-06-01 NOTE — Telephone Encounter (Signed)
Ok to refill for one month  

## 2015-06-03 ENCOUNTER — Other Ambulatory Visit: Payer: Self-pay | Admitting: Adult Health

## 2015-06-11 ENCOUNTER — Other Ambulatory Visit: Payer: Self-pay | Admitting: Family

## 2015-06-15 ENCOUNTER — Telehealth: Payer: Self-pay | Admitting: Adult Health

## 2015-06-15 ENCOUNTER — Other Ambulatory Visit: Payer: Self-pay | Admitting: Adult Health

## 2015-06-15 DIAGNOSIS — M5440 Lumbago with sciatica, unspecified side: Secondary | ICD-10-CM

## 2015-06-15 DIAGNOSIS — E538 Deficiency of other specified B group vitamins: Secondary | ICD-10-CM

## 2015-06-15 MED ORDER — ZOLPIDEM TARTRATE 5 MG PO TABS: 5.0000 mg | ORAL_TABLET | Freq: Every evening | ORAL | Status: DC | PRN

## 2015-06-15 MED ORDER — TRAMADOL HCL 50 MG PO TABS: 50.0000 mg | ORAL_TABLET | Freq: Four times a day (QID) | ORAL | Status: DC | PRN

## 2015-06-15 NOTE — Telephone Encounter (Signed)
Pt will be traveling out of town for several days.  While she will be gone, and her traMADol (ULTRAM) 50 MG tablet zolpidem (AMBIEN) 5 MG tablet Will run out of these meds   Pt coming in Monday for flu shot, would like to know if [facility] will give her paper scripts  To take with her to fill when she needs.

## 2015-06-18 ENCOUNTER — Other Ambulatory Visit (INDEPENDENT_AMBULATORY_CARE_PROVIDER_SITE_OTHER): Payer: Managed Care, Other (non HMO)

## 2015-06-18 DIAGNOSIS — Z23 Encounter for immunization: Secondary | ICD-10-CM | POA: Diagnosis not present

## 2015-06-18 DIAGNOSIS — E538 Deficiency of other specified B group vitamins: Secondary | ICD-10-CM

## 2015-06-18 LAB — VITAMIN B12: VITAMIN B 12: 182 pg/mL — AB (ref 211–911)

## 2015-06-18 MED ORDER — CYANOCOBALAMIN 1000 MCG/ML IJ SOLN
1000.0000 ug | Freq: Once | INTRAMUSCULAR | Status: AC
  Administered 2015-06-18: 1000 ug via INTRAMUSCULAR

## 2015-07-11 ENCOUNTER — Ambulatory Visit (INDEPENDENT_AMBULATORY_CARE_PROVIDER_SITE_OTHER): Payer: Managed Care, Other (non HMO) | Admitting: Adult Health

## 2015-07-11 ENCOUNTER — Encounter: Payer: Self-pay | Admitting: Adult Health

## 2015-07-11 VITALS — BP 130/80 | Temp 97.5°F | Ht 61.0 in | Wt 170.7 lb

## 2015-07-11 DIAGNOSIS — E538 Deficiency of other specified B group vitamins: Secondary | ICD-10-CM

## 2015-07-11 DIAGNOSIS — M545 Low back pain: Secondary | ICD-10-CM | POA: Diagnosis not present

## 2015-07-11 MED ORDER — CYCLOBENZAPRINE HCL 10 MG PO TABS: 10.0000 mg | ORAL_TABLET | Freq: Three times a day (TID) | ORAL | Status: DC | PRN

## 2015-07-11 MED ORDER — HYDROCODONE-ACETAMINOPHEN 10-325 MG PO TABS: 1.0000 | ORAL_TABLET | Freq: Three times a day (TID) | ORAL | Status: DC | PRN

## 2015-07-11 MED ORDER — CYANOCOBALAMIN 1000 MCG/ML IJ SOLN
1000.0000 ug | Freq: Once | INTRAMUSCULAR | Status: AC
  Administered 2015-07-11: 1000 ug via INTRAMUSCULAR

## 2015-07-11 NOTE — Patient Instructions (Signed)
Take the Norco every 8 hours as needed. This medication can cause sleepyness and constipation. Do not take it with Tramadol  Use the Flexeril as a muscle relaxer, three times a day as needed  Continue to use heating pad.   Follow up with orthopedics.

## 2015-07-11 NOTE — Progress Notes (Signed)
Pre visit review using our clinic review tool, if applicable. No additional management support is needed unless otherwise documented below in the visit note. 

## 2015-07-11 NOTE — Progress Notes (Signed)
Subjective:    Patient ID: Bianca [name], female    DOB: [birth year], 69 y.o.   MRN: [id number]  HPI  69 year old female who presents to the office today for chronic back pain that stated to get worse about 3 weeks ago. She endorses that the pain is worse in the morning when she wakes up and sometimes gets better throughout the the day. It is not radiating.  She has a history of spinal fusion x 2. She cannot get into orthopedics until after the holidays.   She has been using Tramadol without relief. Is also using a heating pad which helps slightly.   No recent trauma or falls   Review of Systems  Constitutional: Negative.   HENT: Negative.   Musculoskeletal: Positive for myalgias, back pain and arthralgias. Negative for joint swelling, gait problem, neck pain and neck stiffness.  Skin: Negative.   Neurological: Negative.   All other systems reviewed and are negative.  Past Medical History  Diagnosis Date  . CAD (coronary artery disease)     a. 1997 MI/PCI RCA;  b. 2007 MI/PCI of 100% RCA with Taxus DES x 3 placed, EF 55%;  c. 2010 Cath: stable anatomy;  d. 05/2012 NSTEMI/Cath/PCI: LM nl, LAD 60-48m, D1 20ost, LCX 33m, RCA 40-43m ISR, 60/95d (Treated w/ 2.75x33 Xience Xpedition DES), PDA 15m (Treated w/ PTCA), EF 60%.  . Depression   . Hyperlipidemia   . Carpal tunnel syndrome on both sides   . Numbness of foot left foot    "never recovered from last back OR, 2009" (05/17/2012)  . GERD (gastroesophageal reflux disease)   . Arthritis     "back; probably knees" (05/17/2012)  . Chronic lower back pain   . Anginal pain ([place])   . Myocardial infarction [facility])  1997, 2007 , last 2013    x 3    Social History   Social History  . Marital Status: Married    Spouse Name: N/A  . Number of Children: N/A  . Years of Education: N/A   Occupational History  . Not on file.   Social History Main Topics  . Smoking status: Current Every Day Smoker -- 0.50 packs/day for 50 years    Types:  Cigarettes  . Smokeless tobacco: Never Used  . Alcohol Use: No  . Drug Use: No  . Sexual Activity: Not Currently   Other Topics Concern  . Not on file   Social History Narrative   Is a homemaker   Married for 48 years    Has a daughter and son    Pets: Two dogs and a Neurosurgeon   Likes to garden ( flower beds).           Past Surgical History  Procedure Laterality Date  . Cesarean section  1990  . Ankle surgery  years ago    left; "had to take out a floater"  . Posterior fusion lumbar spine  1962; 2009  . Coronary angioplasty with stent placement  1997; 2007, 2013    "2 + 2; + 1= total of 5  . 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 Left heart catheterization with coronary angiogram N/A 05/18/2012    Procedure: LEFT HEART CATHETERIZATION WITH CORONARY ANGIOGRAM;  Surgeon: Wellington Hampshire, MD;  Location: [place] CATH LAB;  Service: Cardiovascular;  Laterality:  N/A;    Family History  Problem Relation Age of Onset  . Coronary artery disease Mother   . Diabetes Mother   . Hypertension Mother   . Dementia Father   . Sudden death Brother     suicide  . Cancer Paternal Grandfather     esophageal    Allergies  Allergen Reactions  . Oxycodone Hcl Itching and Other (See Comments)    confusion  . Penicillins Rash  . Macrobid [Nitrofurantoin Monohyd Macro] Nausea And Vomiting  . Sulfa Antibiotics Nausea Only    Current Outpatient Prescriptions on File Prior to Visit  Medication Sig Dispense Refill  . albuterol (PROVENTIL HFA;VENTOLIN HFA) 108 (90 BASE) MCG/ACT inhaler Inhale 2 puffs into the lungs every 6 (six) hours as needed for wheezing or shortness of breath. 1 Inhaler 2  . clopidogrel (PLAVIX) 75 MG tablet TAKE 1 TABLET BY MOUTH ONCE DAILY 30 tablet 6  . escitalopram (LEXAPRO) 20 MG tablet TAKE 1 TABLET (20 MG TOTAL) BY  MOUTH DAILY. 90 tablet 1  . fluticasone (FLONASE) 50 MCG/ACT nasal spray Place 2 sprays into both nostrils daily. (Patient taking differently: Place 2 sprays into both nostrils as needed for allergies. ) 16 g 0  . metoprolol (LOPRESSOR) 50 MG tablet TAKE 1/2 TABLETS (25 MG TOTAL) BY MOUTH 2 (TWO) TIMES DAILY. 90 tablet 1  . nitroGLYCERIN (NITROSTAT) 0.4 MG SL tablet Place 1 tablet (0.4 mg total) under the tongue every 5 (five) minutes as needed for chest pain. 25 tablet 3  . pantoprazole (PROTONIX) 40 MG tablet Take 1 tablet (40 mg total) by mouth daily. 90 tablet 3  . simvastatin (ZOCOR) 80 MG tablet Take 1 tablet (80 mg total) by mouth at bedtime. 30 tablet 5  . traMADol (ULTRAM) 50 MG tablet Take 1 tablet (50 mg total) by mouth every 6 (six) hours as needed. 180 tablet 0  . zolpidem (AMBIEN) 5 MG tablet Take 1 tablet (5 mg total) by mouth at bedtime as needed. 30 tablet 0   No current facility-administered medications on file prior to visit.    BP 130/80 mmHg  Temp(Src) 97.5 F (36.4 C) (Oral)  Ht 5\' 1"  (1.549 m)  Wt 170 lb 11.2 oz (77.429 kg)  BMI 32.27 kg/m2       Objective:   Physical Exam  Constitutional: She is oriented to person, place, and time. She appears well-developed and well-nourished. No distress.  Cardiovascular: Normal rate, regular rhythm, normal heart sounds and intact distal pulses.  Exam reveals no gallop and no friction rub.   No murmur heard. Pulmonary/Chest: Effort normal and breath sounds normal. No respiratory distress. She has no wheezes. She has no rales. She exhibits no tenderness.  Musculoskeletal: Normal range of motion. She exhibits tenderness (bilateral lower back with palpation). She exhibits no edema.  Neurological: She is alert and oriented to person, place, and time. She has normal reflexes. She displays normal reflexes. She exhibits normal muscle tone.  Skin: Skin is warm and dry. No rash noted. She is not diaphoretic. No erythema. No pallor.    Surgical scar on lumbar spine from previous surgery.   Psychiatric: She has a normal mood and affect. Her behavior is normal. Judgment and thought content normal.  Nursing note and vitals reviewed.      Assessment & Plan:  1. Low back pain without sciatica, unspecified back pain laterality - HYDROcodone-acetaminophen (NORCO) 10-325 MG tablet; Take 1 tablet by mouth every 8 (eight) hours as needed.  Dispense:  21 tablet; Refill: 0 - cyclobenzaprine (FLEXERIL) 10 MG tablet; Take 1 tablet (10 mg total) by mouth 3 (three) times daily as needed for muscle spasms.  Dispense: 30 tablet; Refill: 0 - Continue to use heating pad - Follow up with ortho - Go to the ER with any numbness or tingling in lower extremities 2. Vitamin B12 deficiency - cyanocobalamin ((VITAMIN B-12)) injection 1,000 mcg; Inject 1 mL (1,000 mcg total) into the muscle once.

## 2015-07-18 ENCOUNTER — Ambulatory Visit (INDEPENDENT_AMBULATORY_CARE_PROVIDER_SITE_OTHER): Payer: Managed Care, Other (non HMO) | Admitting: Adult Health

## 2015-07-18 ENCOUNTER — Encounter: Payer: Self-pay | Admitting: Adult Health

## 2015-07-18 VITALS — BP 110/76 | Temp 98.2°F | Ht 61.0 in | Wt 170.7 lb

## 2015-07-18 DIAGNOSIS — J069 Acute upper respiratory infection, unspecified: Secondary | ICD-10-CM

## 2015-07-18 DIAGNOSIS — R300 Dysuria: Secondary | ICD-10-CM | POA: Diagnosis not present

## 2015-07-18 DIAGNOSIS — Z760 Encounter for issue of repeat prescription: Secondary | ICD-10-CM | POA: Diagnosis not present

## 2015-07-18 DIAGNOSIS — N898 Other specified noninflammatory disorders of vagina: Secondary | ICD-10-CM | POA: Diagnosis not present

## 2015-07-18 LAB — POCT URINALYSIS DIPSTICK
Bilirubin, UA: NEGATIVE
Blood, UA: NEGATIVE
GLUCOSE UA: NEGATIVE
Ketones, UA: NEGATIVE
Leukocytes, UA: NEGATIVE
NITRITE UA: NEGATIVE
PROTEIN UA: NEGATIVE
SPEC GRAV UA: 1.015
UROBILINOGEN UA: 0.2
pH, UA: 6

## 2015-07-18 MED ORDER — HYDROCODONE-ACETAMINOPHEN 10-325 MG PO TABS: 1.0000 | ORAL_TABLET | Freq: Three times a day (TID) | ORAL | Status: DC | PRN

## 2015-07-18 MED ORDER — DOXYCYCLINE HYCLATE 100 MG PO CAPS: 100.0000 mg | ORAL_CAPSULE | Freq: Two times a day (BID) | ORAL | Status: DC

## 2015-07-18 NOTE — Progress Notes (Signed)
Subjective:    Patient ID: Bianca [name], female    DOB: December 14, 1945, 69 y.o.   MRN: [id number]  HPI  69 year old female who presents to the office today for 7 days of upper respiratory infection type symptoms. Her symptoms include frontal and ethmoid sinus pain and pressure, feeling "stuffy", post nasal drip and rhinorrhea, mild chest congestion, dry cough and fatigue. She has been using Mucinex with slight improvement with cough. She has had a subjective fever " I feel warm and then I get cold".   She also has the complaint of continued vaginal irritation and UTI type symptoms ( dysuria, frequency, urgency).   She would also ike a refill of Norco for her chronic back pain. She has not made an appointment to see her orthopedic provider yet.   Review of Systems  Constitutional: Positive for fever, chills, activity change and fatigue. Negative for diaphoresis and appetite change.  HENT: Positive for congestion, postnasal drip, rhinorrhea and sinus pressure. Negative for ear discharge, ear pain, trouble swallowing and voice change.   Respiratory: Positive for cough. Negative for choking, chest tightness, shortness of breath and wheezing.   Cardiovascular: Negative.   Genitourinary: Positive for dysuria, urgency, frequency and vaginal pain. Negative for vaginal bleeding, vaginal discharge and pelvic pain.  Musculoskeletal: Positive for back pain and arthralgias.  Neurological: Positive for headaches. Negative for dizziness and weakness.  All other systems reviewed and are negative.  Past Medical History  Diagnosis Date  . CAD (coronary artery disease)     a. 1997 MI/PCI RCA;  b. 2007 MI/PCI of 100% RCA with Taxus DES x 3 placed, EF 55%;  c. 2010 Cath: stable anatomy;  d. 05/2012 NSTEMI/Cath/PCI: LM nl, LAD 60-34m, D1 20ost, LCX 15m, RCA 40-57m ISR, 60/95d (Treated w/ 2.75x33 Xience Xpedition DES), PDA 12m (Treated w/ PTCA), EF 60%.  . Depression   . Hyperlipidemia   . Carpal tunnel syndrome  on both sides   . Numbness of foot left foot    "never recovered from last back OR, 2009" (05/17/2012)  . GERD (gastroesophageal reflux disease)   . Arthritis     "back; probably knees" (05/17/2012)  . Chronic lower back pain   . Anginal pain ([place])   . Myocardial infarction [facility])  1997, 2007 , last 2013    x 3    Social History   Social History  . Marital Status: Married    Spouse Name: N/A  . Number of Children: N/A  . Years of Education: N/A   Occupational History  . Not on file.   Social History Main Topics  . Smoking status: Current Every Day Smoker -- 0.50 packs/day for 50 years    Types: Cigarettes  . Smokeless tobacco: Never Used  . Alcohol Use: No  . Drug Use: No  . Sexual Activity: Not Currently   Other Topics Concern  . Not on file   Social History Narrative   Is a homemaker   Married for 54 years    Has a daughter and son    Pets: Two dogs and a Neurosurgeon   Likes to garden ( flower beds).           Past Surgical History  Procedure Laterality Date  . Cesarean section  1990  . Ankle surgery  years ago    left; "had to take out a floater"  . Posterior fusion lumbar spine  1962; 2009  . Coronary angioplasty with stent placement  1997; 2007,  2013    "2 + 2; + 1= total of 5  . 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 Left heart catheterization with coronary angiogram N/A 05/18/2012    Procedure: LEFT HEART CATHETERIZATION WITH CORONARY ANGIOGRAM;  Surgeon: Wellington Hampshire, MD;  Location: [place] CATH LAB;  Service: Cardiovascular;  Laterality: N/A;    Family History  Problem Relation Age of Onset  . Coronary artery disease Mother   . Diabetes Mother   . Hypertension Mother   . Dementia Father   . Sudden death Brother     suicide  . Cancer Paternal Grandfather     esophageal    Allergies  Allergen  Reactions  . Oxycodone Hcl Itching and Other (See Comments)    confusion  . Penicillins Rash  . Macrobid [Nitrofurantoin Monohyd Macro] Nausea And Vomiting  . Sulfa Antibiotics Nausea Only    Current Outpatient Prescriptions on File Prior to Visit  Medication Sig Dispense Refill  . albuterol (PROVENTIL HFA;VENTOLIN HFA) 108 (90 BASE) MCG/ACT inhaler Inhale 2 puffs into the lungs every 6 (six) hours as needed for wheezing or shortness of breath. 1 Inhaler 2  . clopidogrel (PLAVIX) 75 MG tablet TAKE 1 TABLET BY MOUTH ONCE DAILY 30 tablet 6  . cyclobenzaprine (FLEXERIL) 10 MG tablet Take 1 tablet (10 mg total) by mouth 3 (three) times daily as needed for muscle spasms. 30 tablet 0  . escitalopram (LEXAPRO) 20 MG tablet TAKE 1 TABLET (20 MG TOTAL) BY MOUTH DAILY. 90 tablet 1  . fluticasone (FLONASE) 50 MCG/ACT nasal spray Place 2 sprays into both nostrils daily. (Patient taking differently: Place 2 sprays into both nostrils as needed for allergies. ) 16 g 0  . metoprolol (LOPRESSOR) 50 MG tablet TAKE 1/2 TABLETS (25 MG TOTAL) BY MOUTH 2 (TWO) TIMES DAILY. 90 tablet 1  . nitroGLYCERIN (NITROSTAT) 0.4 MG SL tablet Place 1 tablet (0.4 mg total) under the tongue every 5 (five) minutes as needed for chest pain. 25 tablet 3  . pantoprazole (PROTONIX) 40 MG tablet Take 1 tablet (40 mg total) by mouth daily. 90 tablet 3  . simvastatin (ZOCOR) 80 MG tablet Take 1 tablet (80 mg total) by mouth at bedtime. 30 tablet 5  . traMADol (ULTRAM) 50 MG tablet Take 1 tablet (50 mg total) by mouth every 6 (six) hours as needed. 180 tablet 0  . zolpidem (AMBIEN) 5 MG tablet Take 1 tablet (5 mg total) by mouth at bedtime as needed. 30 tablet 0   No current facility-administered medications on file prior to visit.    BP 110/76 mmHg  Temp(Src) 98.2 F (36.8 C) (Oral)  Ht 5\' 1"  (1.549 m)  Wt 170 lb 11.2 oz (77.429 kg)  BMI 32.27 kg/m2       Objective:   Physical Exam  Constitutional: She is oriented to  person, place, and time. She appears well-developed and well-nourished. No distress.  HENT:  Head: Normocephalic and atraumatic.  Right Ear: External ear normal.  Left Ear: External ear normal.  Mouth/Throat: No oropharyngeal exudate.  + PND  Eyes: Conjunctivae and EOM are normal. Pupils are equal, round, and reactive to light. Right eye exhibits no discharge. Left eye exhibits no discharge.  Neck: Normal range of motion. Neck supple. No thyromegaly present.  Cardiovascular: Normal rate, regular rhythm, normal heart sounds and intact distal  pulses.  Exam reveals no gallop and no friction rub.   No murmur heard. Pulmonary/Chest: Effort normal and breath sounds normal. No respiratory distress. She has no wheezes. She has no rales. She exhibits no tenderness.  Genitourinary:  Refused  Lymphadenopathy:    She has no cervical adenopathy.  Neurological: She is alert and oriented to person, place, and time.  Skin: Skin is warm and dry. No rash noted. She is not diaphoretic. No erythema. No pallor.  Psychiatric: She has a normal mood and affect. Her behavior is normal. Judgment and thought content normal.  Nursing note and vitals reviewed.      Assessment & Plan:  1. Acute upper respiratory infection - doxycycline (VIBRAMYCIN) 100 MG capsule; Take 1 capsule (100 mg total) by mouth 2 (two) times daily.  Dispense: 14 capsule; Refill: 0 - Use Flonase and normal saline spray - Follow up if no improvement.  2. Vaginal irritation - As I have multiple times in the past, I advised her to go see GYN about possible HRT.  - List of local GYN's given to patient - [place] jelly to help with dryness/irritation - She did not want a vaginal exam and denied any discharge or smell.   3. Dysuria - POCT urinalysis dipstick; Standing - Negative - POCT urinalysis dipstick  4. Medication refill-  - She was advised that this would be the last prescription for narcotics I would be writing for her. She has had  plenty of time to follow up with ortho and has failed to do so.  - HYDROcodone-acetaminophen (NORCO) 10-325 MG tablet; Take 1 tablet by mouth every 8 (eight) hours as needed.  Dispense: 30 tablet; Refill: 0

## 2015-07-18 NOTE — Patient Instructions (Addendum)
Your exam is consistent with an upper respiratory infection. I have sent in a prescription for Doxycyline, take this twice a day for 7 days.   Also use Flonase and normal saline spray.   Follow up with me if you are not feeling any better in the next 2-3 days.     Upper Respiratory Infection, Adult Most upper respiratory infections (URIs) are a viral infection of the air passages leading to the lungs. A URI affects the nose, throat, and upper air passages. The most common type of URI is nasopharyngitis and is typically referred to as "the common cold." URIs run their course and usually go away on their own. Most of the time, a URI does not require medical attention, but sometimes a bacterial infection in the upper airways can follow a viral infection. This is called a secondary infection. Sinus and middle ear infections are common types of secondary upper respiratory infections. Bacterial pneumonia can also complicate a URI. A URI can worsen asthma and chronic obstructive pulmonary disease (COPD). Sometimes, these complications can require emergency medical care and may be life threatening.  CAUSES Almost all URIs are caused by viruses. A virus is a type of germ and can spread from one person to another.  RISKS FACTORS You may be at risk for a URI if:   You smoke.   You have chronic heart or lung disease.  You have a weakened defense (immune) system.   You are very young or very old.   You have nasal allergies or asthma.  You work in crowded or poorly ventilated areas.  You work in health care facilities or schools. SIGNS AND SYMPTOMS  Symptoms typically develop 2-3 days after you come in contact with a cold virus. Most viral URIs last 7-10 days. However, viral URIs from the influenza virus (flu virus) can last 14-18 days and are typically more severe. Symptoms may include:   Runny or stuffy (congested) nose.   Sneezing.   Cough.   Sore throat.   Headache.    Fatigue.   Fever.   Loss of appetite.   Pain in your forehead, behind your eyes, and over your cheekbones (sinus pain).  Muscle aches.  DIAGNOSIS  Your health care provider may diagnose a URI by:  Physical exam.  Tests to check that your symptoms are not due to another condition such as:  Strep throat.  Sinusitis.  Pneumonia.  Asthma. TREATMENT  A URI goes away on its own with time. It cannot be cured with medicines, but medicines may be prescribed or recommended to relieve symptoms. Medicines may help:  Reduce your fever.  Reduce your cough.  Relieve nasal congestion. HOME CARE INSTRUCTIONS   Take medicines only as directed by your health care provider.   Gargle warm saltwater or take cough drops to comfort your throat as directed by your health care provider.  Use a warm mist humidifier or inhale steam from a shower to increase air moisture. This may make it easier to breathe.  Drink enough fluid to keep your urine clear or pale yellow.   Eat soups and other clear broths and maintain good nutrition.   Rest as needed.   Return to work when your temperature has returned to normal or as your health care provider advises. You may need to stay home longer to avoid infecting others. You can also use a face mask and careful hand washing to prevent spread of the virus.  Increase the usage of your inhaler if  you have asthma.   Do not use any tobacco products, including cigarettes, chewing tobacco, or electronic cigarettes. If you need help quitting, ask your health care provider. PREVENTION  The best way to protect yourself from getting a cold is to practice good hygiene.   Avoid oral or hand contact with people with cold symptoms.   Wash your hands often if contact occurs.  There is no clear evidence that vitamin C, vitamin E, echinacea, or exercise reduces the chance of developing a cold. However, it is always recommended to get plenty of rest,  exercise, and practice good nutrition.  SEEK MEDICAL CARE IF:   You are getting worse rather than better.   Your symptoms are not controlled by medicine.   You have chills.  You have worsening shortness of breath.  You have brown or red mucus.  You have yellow or brown nasal discharge.  You have pain in your face, especially when you bend forward.  You have a fever.  You have swollen neck glands.  You have pain while swallowing.  You have white areas in the back of your throat. SEEK IMMEDIATE MEDICAL CARE IF:   You have severe or persistent:  Headache.  Ear pain.  Sinus pain.  Chest pain.  You have chronic lung disease and any of the following:  Wheezing.  Prolonged cough.  Coughing up blood.  A change in your usual mucus.  You have a stiff neck.  You have changes in your:  Vision.  Hearing.  Thinking.  Mood. MAKE SURE YOU:   Understand these instructions.  Will watch your condition.  Will get help right away if you are not doing well or get worse.   This information is not intended to replace advice given to you by your health care provider. Make sure you discuss any questions you have with your health care provider.   Document Released: 01/28/2001 Document Revised: 12/19/2014 Document Reviewed: 11/09/2013 Elsevier Interactive Patient Education [employer].

## 2015-07-18 NOTE — Progress Notes (Signed)
Pre visit review using our clinic review tool, if applicable. No additional management support is needed unless otherwise documented below in the visit note. 

## 2015-07-31 ENCOUNTER — Encounter: Payer: Self-pay | Admitting: Certified Nurse Midwife

## 2015-07-31 ENCOUNTER — Ambulatory Visit (INDEPENDENT_AMBULATORY_CARE_PROVIDER_SITE_OTHER): Payer: Managed Care, Other (non HMO) | Admitting: Certified Nurse Midwife

## 2015-07-31 VITALS — BP 122/70 | HR 66 | Resp 14 | Ht 61.0 in | Wt 169.0 lb

## 2015-07-31 DIAGNOSIS — N952 Postmenopausal atrophic vaginitis: Secondary | ICD-10-CM | POA: Diagnosis not present

## 2015-07-31 DIAGNOSIS — B373 Candidiasis of vulva and vagina: Secondary | ICD-10-CM | POA: Diagnosis not present

## 2015-07-31 DIAGNOSIS — B3731 Acute candidiasis of vulva and vagina: Secondary | ICD-10-CM

## 2015-07-31 DIAGNOSIS — R300 Dysuria: Secondary | ICD-10-CM

## 2015-07-31 LAB — POCT URINALYSIS DIPSTICK
BILIRUBIN UA: NEGATIVE
Blood, UA: NEGATIVE
GLUCOSE UA: NEGATIVE
KETONES UA: NEGATIVE
LEUKOCYTES UA: NEGATIVE
NITRITE UA: NEGATIVE
PH UA: 5
Protein, UA: NEGATIVE
Urobilinogen, UA: NEGATIVE

## 2015-07-31 MED ORDER — TERCONAZOLE 0.4 % VA CREA: 1.0000 | TOPICAL_CREAM | Freq: Every day | VAGINAL | Status: DC

## 2015-07-31 NOTE — Progress Notes (Signed)
Bianca [name] 69 y.o. G2P0 Married  Caucasian Fe here to establish gyn care and for problem of vaginal burning.. Patient has incontinence seen By Dr. Tresa Moore and no issues other than incontinence. Uses pads as needed. Was being seen by Dr. Boyd Kerbs last year for aex, breast exam/vaginal exam with pap smear. Negative per patient. Was seen by Urgent care and told she had vaginal dryness and has tried Replens and coconut oil, with no relief. Not sexually active due spouse stroke. Has not used any HRT due to heart attack at [age] and in 2013 with 5 stents placement. Smoker working on smoking cessation. "Not sure what the problem is but vagina burns and when urine touches skin burns". No urinary frequency, urgency or pain. Denies increase in vaginal discharge, but slight odor. Treated recently with Doxy for URI and vaginal symptoms have increased. Menopausal. Sees PCP for Depression/cholesterol and labs/aex. Sees cardiology for labs and follow up. Plans to have gyn exam here, due to new PCP is a female. No other health concerns today.  No LMP recorded (lmp unknown). Patient is postmenopausal.  Since early age 25.        Sexually active: No.  The current method of family planning is post menopausal status.    Exercising: No.  Patient has back problems.//kg Smoker:  yes  Health Maintenance: Pap:  01/10/2014 Negative No Abnormal paps MMG:  01/05/2014 BIRADS 0 Incomplete per chart cyst but was to repeat, not done Colonoscopy: Approx 3 years ago as per pt BMD:   None TDaP:  2008 Labs: UA Neg   reports that she has been smoking Cigarettes.  She has a 25 pack-year smoking history. She has never used smokeless tobacco. She reports that she does not drink alcohol or use illicit drugs.  Past Medical History  Diagnosis Date  . CAD (coronary artery disease)     a. 1997 MI/PCI RCA;  b. 2007 MI/PCI of 100% RCA with Taxus DES x 3 placed, EF 55%;  c. 2010 Cath: stable anatomy;  d. 05/2012 NSTEMI/Cath/PCI: LM nl, LAD 60-80m, D1  20ost, LCX 34m, RCA 40-68m ISR, 60/95d (Treated w/ 2.75x33 Xience Xpedition DES), PDA 37m (Treated w/ PTCA), EF 60%.  . Depression   . Hyperlipidemia   . Carpal tunnel syndrome on both sides   . Numbness of foot left foot    "never recovered from last back OR, 2009" (05/17/2012)  . GERD (gastroesophageal reflux disease)   . Arthritis     "back; probably knees" (05/17/2012)  . Chronic lower back pain   . Anginal pain ([place])   . Myocardial infarction [facility])  1997, 2007 , last 2013    x 3  . Blood transfusion without reported diagnosis     Past Surgical History  Procedure Laterality Date  . Cesarean section  1990  . Ankle surgery  years ago    left; "had to take out a floater"  . Posterior fusion lumbar spine  1962; 2009  . Coronary angioplasty with stent placement  1997; 2007, 2013    "2 + 2; + 1= total of 5  . 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 Left heart catheterization with coronary angiogram N/A 05/18/2012    Procedure: LEFT HEART CATHETERIZATION WITH CORONARY ANGIOGRAM;  Surgeon: Wellington Hampshire, MD;  Location: [facility];  Service: Cardiovascular;  Laterality: N/A;  Current Outpatient Prescriptions  Medication Sig Dispense Refill  . clopidogrel (PLAVIX) 75 MG tablet TAKE 1 TABLET BY MOUTH ONCE DAILY 30 tablet 6  . escitalopram (LEXAPRO) 20 MG tablet TAKE 1 TABLET (20 MG TOTAL) BY MOUTH DAILY. 90 tablet 1  . metoprolol (LOPRESSOR) 50 MG tablet TAKE 1/2 TABLETS (25 MG TOTAL) BY MOUTH 2 (TWO) TIMES DAILY. 90 tablet 1  . pantoprazole (PROTONIX) 40 MG tablet Take 1 tablet (40 mg total) by mouth daily. 90 tablet 3  . simvastatin (ZOCOR) 80 MG tablet Take 1 tablet (80 mg total) by mouth at bedtime. 30 tablet 5  . traMADol (ULTRAM) 50 MG tablet Take 1 tablet (50 mg total) by mouth every 6 (six) hours as needed. 180 tablet 0   . zolpidem (AMBIEN) 5 MG tablet Take 1 tablet (5 mg total) by mouth at bedtime as needed. 30 tablet 0   No current facility-administered medications for this visit.    Family History  Problem Relation Age of Onset  . Coronary artery disease Mother   . Diabetes Mother   . Hypertension Mother   . Dementia Father   . Sudden death Brother     suicide  . Cancer Paternal Grandfather     esophageal    ROS:  Pertinent items are noted in HPI.  Otherwise, a comprehensive ROS was negative.  Exam:   BP 122/70 mmHg  Pulse 66  Resp 14  Ht 5\' 1"  (1.549 m)  Wt 169 lb (76.658 kg)  BMI 31.95 kg/m2  LMP  (LMP Unknown) Height: 5\' 1"  (154.9 cm) Ht Readings from Last 3 Encounters:  07/31/15 5\' 1"  (1.549 m)  07/18/15 5\' 1"  (1.549 m)  07/11/15 5\' 1"  (1.549 m)    General appearance: alert, cooperative and appears stated age, very talkative Abdomen: soft, non-tender; no masses,  no organomegaly Extremities: extremities normal, atraumatic, no cyanosis or edema Skin: Skin color, texture, turgor normal. No rashes or lesions No abnormal inguinal nodes palpated Neurologic: Grossly normal   Pelvic: External genitalia:  no lesions, normal female, atrophic appearance, no scaling or exudate noted              Urethra:  normal appearing urethra with no masses, tenderness or lesions  Bladder: non tender,  Urethral meatus: non tender, no redness, prominent, no leaking noted with cough              Bartholin's and Skene's: normal                 Vagina: normal appearing vagina with atrophy noted and thick white odorous discharge noted, tender on exam with pale color, affirm taken, appearance of yeast , negative for cystocele, good support            Cervix: normal,non tender, no lesions noted              Pap taken: No. Bimanual Exam:  Uterus:  normal size, contour, position, consistency, mobility, non-tender              Adnexa: normal adnexa and no mass, fullness, tenderness               Rectovaginal:  Confirms               Anus:  normal appearance  Chaperone present: yes  A:  Normal pelvic exam  Atrophic vaginitis  Suspect yeast vaginitis  Stress incontinence  Incomplete mammogram follow up for left breast cyst per note probably benign  Coronary artery  disease with 3 heart attacks with stent placement with cardiology management  Cholesterol and depression with PCP management   P:   Reviewed findings of normal pelvic exam.  Discussed atrophic vaginitis finding and etiology. Discussed management with OTC products to history of heart issues not a good candidate for estrogen products. Would recommend pure coconut oil nightly with applicator and externally daily for protection with urine leakage. Patient feels she can do this. Discuss sporadic use will not maintain moisture and protection. Questions addressed at length.  Discussed suspect yeast vaginitis with appearance, affirm done for confirmation.  Rx Terazol 7 Cream see order with instructions to apply coconut oil 15 minutes prior to inserted Terazol cream to prevent burning . Use nightly for 7 nights. Continue with coconut oil nightly upon completion and will recheck at aex in 2-3 weeks. Patient agreeable  Lab Affirm.  Continue to work on [employer] and apply coconut oil externally to prevent irritation from pads as needed. Pat when urinating with toilet paper to lessen trauma to tissue.  Stressed importance of mammogram follow up, active order in, patient will try to schedule, declined our help with.  Rv 2-3 weeks with aex and will recheck status, prn   Time spent with patient in face to face consult regarding vaginal dryness and management in addition to exam 15 minutes.

## 2015-07-31 NOTE — Patient Instructions (Signed)
Atrophic Vaginitis Atrophic vaginitis is a condition in which the tissues that line the vagina become dry and thin. This condition is most common in women who have stopped having regular menstrual periods (menopause). This usually starts when a woman is 45-69 years old. Estrogen helps to keep the vagina moist. It stimulates the vagina to produce a clear fluid that lubricates the vagina for sexual intercourse. This fluid also protects the vagina from infection. Lack of estrogen can cause the lining of the vagina to get thinner and dryer. The vagina may also shrink in size. It may become less elastic. Atrophic vaginitis tends to get worse over time as a woman's estrogen level drops. CAUSES This condition is caused by the normal drop in estrogen that happens around the time of menopause. RISK FACTORS Certain conditions or situations may lower a woman's estrogen level, which increases her risk of atrophic vaginitis. These include:  Taking medicine that blocks estrogen.  Having ovaries removed surgically.  Being treated for cancer with X-ray treatment (radiation) or medicines (chemotherapy).  Exercising very hard and often.  Having an eating disorder (anorexia).  Giving birth or breastfeeding.  Being over the age of 50.  Smoking. SYMPTOMS Symptoms of this condition include:  Pain, soreness, or bleeding during sexual intercourse (dyspareunia).  Vaginal burning, irritation, or itching.  Pain or bleeding during a vaginal examination using a speculum (pelvic exam).  Loss of interest in sexual activity.  Having burning pain when passing urine.  Vaginal discharge that is brown or yellow. In some cases, there are no symptoms. DIAGNOSIS This condition is diagnosed with a medical history and physical exam. This will include a pelvic exam that checks whether the inside of your vagina appears pale, thin, or dry. Rarely, you may also have other tests, including:  A urine test.  A test that  checks the acid balance in your vaginal fluid (acid balance test). TREATMENT Treatment for this condition may depend on the severity of your symptoms. Treatment may include:  Using an over-the-counter vaginal lubricant before you have sexual intercourse.  Using a long-acting vaginal moisturizer.  Using low-dose vaginal estrogen for moderate to severe symptoms that do not respond to other treatments. Options include creams, tablets, and inserts (vaginal rings). Before using vaginal estrogen, tell your health care provider if you have a history of:  Breast cancer.  Endometrial cancer.  Blood clots.  Taking medicines. You may be able to take a daily pill for dyspareunia. Discuss all of the risks of this medicine with your health care provider. It is usually not recommended for women who have a family history or personal history of breast cancer. If your symptoms are very mild and you are not sexually active, you may not need treatment. HOME CARE INSTRUCTIONS  Take medicines only as directed by your health care provider. Do not use herbal or alternative medicines unless your health care provider says that you can.  Use over-the-counter creams, lubricants, or moisturizers for dryness only as directed by your health care provider.  If your atrophic vaginitis is caused by menopause, discuss all of your menopausal symptoms and treatment options with your health care provider.  Do not douche.  Do not use products that can make your vagina dry. These include:  Scented feminine sprays.  Scented tampons.  Scented soaps.  If it hurts to have sex, talk with your sexual partner. SEEK MEDICAL CARE IF:  Your discharge looks different than normal.  Your vagina has an unusual smell.  You have   new symptoms.  Your symptoms do not improve with treatment.  Your symptoms get worse.   This information is not intended to replace advice given to you by your health care provider. Make sure you  discuss any questions you have with your health care provider.   Document Released: 12/19/2014 Document Reviewed: 12/19/2014 Elsevier Interactive Patient Education 2016 Else[employer] Monilial Vaginitis Vaginitis in a soreness, swelling and redness (inflammation) of the vagina and vulva. Monilial vaginitis is not a sexually transmitted infection. CAUSES  Yeast vaginitis is caused by yeast (candida) that is normally found in your vagina. With a yeast infection, the candida has overgrown in number to a point that upsets the chemical balance. SYMPTOMS   White, thick vaginal discharge.  Swelling, itching, redness and irritation of the vagina and possibly the lips of the vagina (vulva).  Burning or painful urination.  Painful intercourse. DIAGNOSIS  Things that may contribute to monilial vaginitis are:  Postmenopausal and virginal states.  Pregnancy.  Infections.  Being tired, sick or stressed, especially if you had monilial vaginitis in the past.  Diabetes. Good control will help lower the chance.  Birth control pills.  Tight fitting garments.  Using bubble bath, feminine sprays, douches or deodorant tampons.  Taking certain medications that kill germs (antibiotics).  Sporadic recurrence can occur if you become ill. TREATMENT  Your caregiver will give you medication.  There are several kinds of anti monilial vaginal creams and suppositories specific for monilial vaginitis. For recurrent yeast infections, use a suppository or cream in the vagina 2 times a week, or as directed.  Anti-monilial or steroid cream for the itching or irritation of the vulva may also be used. Get your caregiver's permission.  Painting the vagina with methylene blue solution may help if the monilial cream does not work.  Eating yogurt may help prevent monilial vaginitis. HOME CARE INSTRUCTIONS   Finish all medication as prescribed.  Do not have sex until treatment is completed or after your  caregiver tells you it is okay.  Take warm sitz baths.  Do not douche.  Do not use tampons, especially scented ones.  Wear cotton underwear.  Avoid tight pants and panty hose.  Tell your sexual partner that you have a yeast infection. They should go to their caregiver if they have symptoms such as mild rash or itching.  Your sexual partner should be treated as well if your infection is difficult to eliminate.  Practice safer sex. Use condoms.  Some vaginal medications cause latex condoms to fail. Vaginal medications that harm condoms are:  Cleocin cream.  Butoconazole (Femstat).  Terconazole (Terazol) vaginal suppository.  Miconazole (Monistat) (may be purchased over the counter). SEEK MEDICAL CARE IF:   You have a temperature by mouth above 102 F (38.9 C).  The infection is getting worse after 2 days of treatment.  The infection is not getting better after 3 days of treatment.  You develop blisters in or around your vagina.  You develop vaginal bleeding, and it is not your menstrual period.  You have pain when you urinate.  You develop intestinal problems.  You have pain with sexual intercourse.   This information is not intended to replace advice given to you by your health care provider. Make sure you discuss any questions you have with your health care provider.   Document Released: 05/14/2005 Document Revised: 10/27/2011 Document Reviewed: 02/05/2015 Elsevier Interactive Patient Education [employer].

## 2015-08-01 ENCOUNTER — Telehealth: Payer: Self-pay | Admitting: Adult Health

## 2015-08-01 NOTE — Telephone Encounter (Signed)
Pt needs refill on tramadol [facility] [address]. Pt husband would like to pick up med tonight. Pt is aware may take up to 3 business day. Pt has an appt tomorrow

## 2015-08-02 ENCOUNTER — Encounter: Payer: Self-pay | Admitting: Adult Health

## 2015-08-02 ENCOUNTER — Other Ambulatory Visit: Payer: Self-pay | Admitting: Adult Health

## 2015-08-02 ENCOUNTER — Telehealth: Payer: Self-pay | Admitting: Certified Nurse Midwife

## 2015-08-02 ENCOUNTER — Ambulatory Visit (INDEPENDENT_AMBULATORY_CARE_PROVIDER_SITE_OTHER): Payer: Managed Care, Other (non HMO) | Admitting: Adult Health

## 2015-08-02 VITALS — BP 120/78 | HR 60 | Temp 98.6°F | Ht 61.0 in | Wt 168.8 lb

## 2015-08-02 DIAGNOSIS — B373 Candidiasis of vulva and vagina: Secondary | ICD-10-CM

## 2015-08-02 DIAGNOSIS — J0100 Acute maxillary sinusitis, unspecified: Secondary | ICD-10-CM | POA: Diagnosis not present

## 2015-08-02 DIAGNOSIS — R05 Cough: Secondary | ICD-10-CM

## 2015-08-02 DIAGNOSIS — M5440 Lumbago with sciatica, unspecified side: Secondary | ICD-10-CM | POA: Diagnosis not present

## 2015-08-02 DIAGNOSIS — E538 Deficiency of other specified B group vitamins: Secondary | ICD-10-CM

## 2015-08-02 DIAGNOSIS — B3731 Acute candidiasis of vulva and vagina: Secondary | ICD-10-CM

## 2015-08-02 DIAGNOSIS — R059 Cough, unspecified: Secondary | ICD-10-CM

## 2015-08-02 LAB — CBC WITH DIFFERENTIAL/PLATELET
BASOS PCT: 0.5 % (ref 0.0–3.0)
Basophils Absolute: 0 10*3/uL (ref 0.0–0.1)
EOS ABS: 0.2 10*3/uL (ref 0.0–0.7)
EOS PCT: 2.8 % (ref 0.0–5.0)
HEMATOCRIT: 40.4 % (ref 36.0–46.0)
HEMOGLOBIN: 13.6 g/dL (ref 12.0–15.0)
LYMPHS PCT: 42.1 % (ref 12.0–46.0)
Lymphs Abs: 3 10*3/uL (ref 0.7–4.0)
MCHC: 33.7 g/dL (ref 30.0–36.0)
MCV: 94.2 fl (ref 78.0–100.0)
Monocytes Absolute: 0.7 10*3/uL (ref 0.1–1.0)
Monocytes Relative: 10.4 % (ref 3.0–12.0)
NEUTROS ABS: 3.1 10*3/uL (ref 1.4–7.7)
Neutrophils Relative %: 44.2 % (ref 43.0–77.0)
PLATELETS: 251 10*3/uL (ref 150.0–400.0)
RBC: 4.3 Mil/uL (ref 3.87–5.11)
RDW: 12.9 % (ref 11.5–15.5)
WBC: 7 10*3/uL (ref 4.0–10.5)

## 2015-08-02 LAB — WET PREP BY MOLECULAR PROBE
CANDIDA SPECIES: NEGATIVE
Gardnerella vaginalis: NEGATIVE
Trichomonas vaginosis: NEGATIVE

## 2015-08-02 MED ORDER — CYANOCOBALAMIN 1000 MCG/ML IJ SOLN
1000.0000 ug | Freq: Once | INTRAMUSCULAR | Status: AC
  Administered 2015-08-02: 1000 ug via INTRAMUSCULAR

## 2015-08-02 MED ORDER — METHYLPREDNISOLONE 4 MG PO TBPK: ORAL_TABLET | ORAL | Status: DC

## 2015-08-02 MED ORDER — ZOLPIDEM TARTRATE 5 MG PO TABS: 5.0000 mg | ORAL_TABLET | Freq: Every evening | ORAL | Status: DC | PRN

## 2015-08-02 MED ORDER — TERCONAZOLE 0.4 % VA CREA: 1.0000 | TOPICAL_CREAM | Freq: Every day | VAGINAL | Status: DC

## 2015-08-02 MED ORDER — TRAMADOL HCL 50 MG PO TABS: 50.0000 mg | ORAL_TABLET | Freq: Four times a day (QID) | ORAL | Status: DC | PRN

## 2015-08-02 NOTE — Progress Notes (Signed)
Pre visit review using our clinic review tool, if applicable. No additional management support is needed unless otherwise documented below in the visit note. 

## 2015-08-02 NOTE — Telephone Encounter (Signed)
Order for Terazol 7 place 1 applicator at bedtime nightly [facility]. Spoke with MiMi at [place] to verify order was received. MiMi states rx has been received and she will process it at this time. Patient notified and is agreeable.  Routing to provider for final review. Patient agreeable to disposition. Will close encounter.

## 2015-08-02 NOTE — Telephone Encounter (Signed)
Patient said [facility] has [facility] [address] [facility] and they do not have her rx for Terazol Cream that we sent in 07/31/2015. Best # to reach: [phone]

## 2015-08-02 NOTE — Telephone Encounter (Signed)
Will discuss this at her appointment.

## 2015-08-02 NOTE — Patient Instructions (Addendum)
I will follow up with you regarding your lab work and then we can decide on what to do with medication.   You need to follow up with Orthopedics, call them today to make an appointment.   Think about if you want to see an allergist or ENT for the sinus issues.

## 2015-08-02 NOTE — Progress Notes (Signed)
Subjective:    Patient ID: Bianca [name], female    DOB: Aug 28, 1945, 69 y.o.   MRN: [id number]  HPI  69 year old female who presents to the office today for three weeks of upper respiratory type symptoms. She was seen on July 18 2015 for this issue and was prescribed a 7 day course of Doxycycline. She endorses taking the medication but it did not help at all for her sinusitis. She also endorses using Flonase and Mucinex, which has also failed.   She has been seen in the past by ENT for chronic sinus infections and feel like " they didn't do anything".   She continues to have a dry cough, "stuffy" nose, chest congestion and fatigue for three weeks. She denies having a fever, n/v/d.   She would like to have " a blood test done to see what is going on."   She also needs a Tramadol refill - has failed to follow up with ortho, states " I haven't been feeling well for the last three weeks, I didn't want to call them."     Review of Systems  Constitutional: Negative.   HENT: Positive for congestion and sinus pressure (maxiillary). Negative for ear discharge, ear pain, postnasal drip, rhinorrhea, sneezing, sore throat and tinnitus.   Eyes: Negative.   Respiratory: Positive for cough. Negative for chest tightness, shortness of breath and wheezing.   Cardiovascular: Negative.   Gastrointestinal: Negative.   Endocrine: Negative.   Genitourinary: Negative.   All other systems reviewed and are negative.  Past Medical History  Diagnosis Date  . CAD (coronary artery disease)     a. 1997 MI/PCI RCA;  b. 2007 MI/PCI of 100% RCA with Taxus DES x 3 placed, EF 55%;  c. 2010 Cath: stable anatomy;  d. 05/2012 NSTEMI/Cath/PCI: LM nl, LAD 60-53m, D1 20ost, LCX 47m, RCA 40-28m ISR, 60/95d (Treated w/ 2.75x33 Xience Xpedition DES), PDA 5m (Treated w/ PTCA), EF 60%.  . Depression   . Hyperlipidemia   . Carpal tunnel syndrome on both sides   . Numbness of foot left foot    "never recovered from last  back OR, 2009" (05/17/2012)  . GERD (gastroesophageal reflux disease)   . Arthritis     "back; probably knees" (05/17/2012)  . Chronic lower back pain   . Anginal pain ([place])   . Myocardial infarction [facility])  1997, 2007 , last 2013    x 3  . Blood transfusion without reported diagnosis     Social History   Social History  . Marital Status: Married    Spouse Name: N/A  . Number of Children: N/A  . Years of Education: N/A   Occupational History  . Not on file.   Social History Main Topics  . Smoking status: Current Every Day Smoker -- 0.50 packs/day for 50 years    Types: Cigarettes  . Smokeless tobacco: Never Used     Comment: Working on it at this time.//kg  . Alcohol Use: No  . Drug Use: No  . Sexual Activity: Not Currently   Other Topics Concern  . Not on file   Social History Narrative   Is a homemaker   Married for 40 years    Has a daughter and son    Pets: Two dogs and a Neurosurgeon   Likes to garden ( flower beds).           Past Surgical History  Procedure Laterality Date  . Cesarean section  1990  .  Ankle surgery  years ago    left; "had to take out a floater"  . Posterior fusion lumbar spine  1962; 2009  . Coronary angioplasty with stent placement  1997; 2007, 2013    "2 + 2; + 1= total of 5  . 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 Left heart catheterization with coronary angiogram N/A 05/18/2012    Procedure: LEFT HEART CATHETERIZATION WITH CORONARY ANGIOGRAM;  Surgeon: Wellington Hampshire, MD;  Location: [facility] CATH LAB;  Service: Cardiovascular;  Laterality: N/A;    Family History  Problem Relation Age of Onset  . Coronary artery disease Mother   . Diabetes Mother   . Hypertension Mother   . Dementia Father   . Sudden death Brother     suicide  . Cancer Paternal Grandfather     esophageal     Allergies  Allergen Reactions  . Oxycodone Hcl Itching and Other (See Comments)    confusion  . Penicillins Rash  . Macrobid [Nitrofurantoin Monohyd Macro] Nausea And Vomiting  . Sulfa Antibiotics Nausea Only    Current Outpatient Prescriptions on File Prior to Visit  Medication Sig Dispense Refill  . clopidogrel (PLAVIX) 75 MG tablet TAKE 1 TABLET BY MOUTH ONCE DAILY 30 tablet 6  . escitalopram (LEXAPRO) 20 MG tablet TAKE 1 TABLET (20 MG TOTAL) BY MOUTH DAILY. 90 tablet 1  . metoprolol (LOPRESSOR) 50 MG tablet TAKE 1/2 TABLETS (25 MG TOTAL) BY MOUTH 2 (TWO) TIMES DAILY. 90 tablet 1  . pantoprazole (PROTONIX) 40 MG tablet Take 1 tablet (40 mg total) by mouth daily. 90 tablet 3  . simvastatin (ZOCOR) 80 MG tablet Take 1 tablet (80 mg total) by mouth at bedtime. 30 tablet 5  . terconazole (TERAZOL 7) 0.4 % vaginal cream Place 1 applicator vaginally at bedtime. 45 g 0   No current facility-administered medications on file prior to visit.    BP 120/78 mmHg  Pulse 60  Temp(Src) 98.6 F (37 C) (Oral)  Ht 5\' 1"  (1.549 m)  Wt 168 lb 12.8 oz (76.567 kg)  BMI 31.91 kg/m2  SpO2 97%  LMP  (LMP Unknown)       Objective:   Physical Exam  Constitutional: She is oriented to person, place, and time. She appears well-developed and well-nourished. No distress.  HENT:  Head: Normocephalic and atraumatic.  Right Ear: External ear normal.  Left Ear: External ear normal.  Nose: Nose normal.  Mouth/Throat: Oropharynx is clear and moist. No oropharyngeal exudate.  Eyes: Conjunctivae and EOM are normal. Pupils are equal, round, and reactive to light. Right eye exhibits no discharge. Left eye exhibits discharge. No scleral icterus.  Neck: Normal range of motion. Neck supple. No thyromegaly present.  Cardiovascular: Normal rate, regular rhythm, normal heart sounds and intact distal pulses.  Exam reveals no gallop.   No murmur heard. Pulmonary/Chest: Effort normal and breath sounds normal. No  respiratory distress. She has no wheezes. She has no rales.  Abdominal: Soft. Bowel sounds are normal. She exhibits no distension and no mass. There is no tenderness. There is no rebound and no guarding.  Musculoskeletal: Normal range of motion.  Lymphadenopathy:    She has no cervical adenopathy.  Neurological: She is alert and oriented to person, place, and time.  Skin: Skin is warm and dry. No rash noted. She is not  diaphoretic. No erythema. No pallor.  Psychiatric: She has a normal mood and affect. Her behavior is normal. Judgment and thought content normal.  Nursing note and vitals reviewed.      Assessment & Plan:  1. Subacute maxillary sinusitis - Will wait for results before prescribing abx or prednisone as this is what the patient wishes to be done.  - CBC with Differential/Platelet - No concern for pna or mono .  2. Low back pain with sciatica, sciatica laterality unspecified, unspecified back pain laterality - Advised again, that I was not going to be managing her chronic pain and that she needs to follow up with Orthopedics. She has had plenty of time and I will no longer be writing her for pain medication.  - traMADol (ULTRAM) 50 MG tablet; Take 1 tablet (50 mg total) by mouth every 6 (six) hours as needed.  Dispense: 180 tablet; Refill: 0  3. B12 deficiency - cyanocobalamin ((VITAMIN B-12)) injection 1,000 mcg; Inject 1 mL (1,000 mcg total) into the muscle once.  4. Cough - Likely bronchitis.  - Will consider prescribing prednisone.

## 2015-08-04 NOTE — Progress Notes (Signed)
Reviewed personally.  M. Suzanne Arielys Wandersee, MD.  

## 2015-08-16 ENCOUNTER — Ambulatory Visit: Payer: Managed Care, Other (non HMO) | Admitting: Certified Nurse Midwife

## 2015-08-21 ENCOUNTER — Telehealth: Payer: Self-pay

## 2015-08-21 NOTE — Telephone Encounter (Signed)
Pt called and states she was trying to clean and take her medication during the holidays and accidentally dropped her Ambien prescription in the sink and most went down the drain.  Pt states she was suppose to have enough to last her until the 16th but she only has 2 pills left.  Please advise if pt's Ambien can be called in to [facility].

## 2015-08-21 NOTE — Telephone Encounter (Signed)
Call in #[phone] with no refill

## 2015-08-22 ENCOUNTER — Ambulatory Visit (INDEPENDENT_AMBULATORY_CARE_PROVIDER_SITE_OTHER): Payer: Managed Care, Other (non HMO)

## 2015-08-22 DIAGNOSIS — E538 Deficiency of other specified B group vitamins: Secondary | ICD-10-CM

## 2015-08-22 MED ORDER — ZOLPIDEM TARTRATE 5 MG PO TABS: 5.0000 mg | ORAL_TABLET | Freq: Every evening | ORAL | Status: DC | PRN

## 2015-08-22 MED ORDER — CYANOCOBALAMIN 1000 MCG/ML IJ SOLN
1000.0000 ug | Freq: Once | INTRAMUSCULAR | Status: AC
  Administered 2015-08-22: 1000 ug via INTRAMUSCULAR

## 2015-08-22 NOTE — Telephone Encounter (Signed)
Rx sent to pharmacy.  I will make pt aware when she comes in to the office today for her injection.

## 2015-09-11 ENCOUNTER — Ambulatory Visit (INDEPENDENT_AMBULATORY_CARE_PROVIDER_SITE_OTHER): Payer: Managed Care, Other (non HMO) | Admitting: Certified Nurse Midwife

## 2015-09-11 ENCOUNTER — Encounter: Payer: Self-pay | Admitting: Certified Nurse Midwife

## 2015-09-11 VITALS — BP 120/72 | HR 72 | Resp 16 | Ht 61.0 in | Wt 168.0 lb

## 2015-09-11 DIAGNOSIS — M549 Dorsalgia, unspecified: Secondary | ICD-10-CM | POA: Diagnosis not present

## 2015-09-11 DIAGNOSIS — G8929 Other chronic pain: Secondary | ICD-10-CM

## 2015-09-11 DIAGNOSIS — Z124 Encounter for screening for malignant neoplasm of cervix: Secondary | ICD-10-CM

## 2015-09-11 DIAGNOSIS — Z01419 Encounter for gynecological examination (general) (routine) without abnormal findings: Secondary | ICD-10-CM | POA: Diagnosis not present

## 2015-09-11 DIAGNOSIS — R319 Hematuria, unspecified: Secondary | ICD-10-CM | POA: Diagnosis not present

## 2015-09-11 DIAGNOSIS — N898 Other specified noninflammatory disorders of vagina: Secondary | ICD-10-CM | POA: Diagnosis not present

## 2015-09-11 DIAGNOSIS — Z0000 Encounter for general adult medical examination without abnormal findings: Secondary | ICD-10-CM

## 2015-09-11 LAB — POCT URINALYSIS DIPSTICK
Bilirubin, UA: NEGATIVE
Glucose, UA: NEGATIVE
Ketones, UA: NEGATIVE
LEUKOCYTES UA: NEGATIVE
Nitrite, UA: NEGATIVE
PH UA: 5
PROTEIN UA: NEGATIVE
UROBILINOGEN UA: NEGATIVE

## 2015-09-11 NOTE — Patient Instructions (Signed)
Atrophic Vaginitis Atrophic vaginitis is a condition in which the tissues that line the vagina become dry and thin. This condition is most common in women who have stopped having regular menstrual periods (menopause). This usually starts when a woman is 45-70 years old. Estrogen helps to keep the vagina moist. It stimulates the vagina to produce a clear fluid that lubricates the vagina for sexual intercourse. This fluid also protects the vagina from infection. Lack of estrogen can cause the lining of the vagina to get thinner and dryer. The vagina may also shrink in size. It may become less elastic. Atrophic vaginitis tends to get worse over time as a woman's estrogen level drops. CAUSES This condition is caused by the normal drop in estrogen that happens around the time of menopause. RISK FACTORS Certain conditions or situations may lower a woman's estrogen level, which increases her risk of atrophic vaginitis. These include:  Taking medicine that blocks estrogen.  Having ovaries removed surgically.  Being treated for cancer with X-ray treatment (radiation) or medicines (chemotherapy).  Exercising very hard and often.  Having an eating disorder (anorexia).  Giving birth or breastfeeding.  Being over the age of 50.  Smoking. SYMPTOMS Symptoms of this condition include:  Pain, soreness, or bleeding during sexual intercourse (dyspareunia).  Vaginal burning, irritation, or itching.  Pain or bleeding during a vaginal examination using a speculum (pelvic exam).  Loss of interest in sexual activity.  Having burning pain when passing urine.  Vaginal discharge that is brown or yellow. In some cases, there are no symptoms. DIAGNOSIS This condition is diagnosed with a medical history and physical exam. This will include a pelvic exam that checks whether the inside of your vagina appears pale, thin, or dry. Rarely, you may also have other tests, including:  A urine test.  A test that  checks the acid balance in your vaginal fluid (acid balance test). TREATMENT Treatment for this condition may depend on the severity of your symptoms. Treatment may include:  Using an over-the-counter vaginal lubricant before you have sexual intercourse.  Using a long-acting vaginal moisturizer.  Using low-dose vaginal estrogen for moderate to severe symptoms that do not respond to other treatments. Options include creams, tablets, and inserts (vaginal rings). Before using vaginal estrogen, tell your health care provider if you have a history of:  Breast cancer.  Endometrial cancer.  Blood clots.  Taking medicines. You may be able to take a daily pill for dyspareunia. Discuss all of the risks of this medicine with your health care provider. It is usually not recommended for women who have a family history or personal history of breast cancer. If your symptoms are very mild and you are not sexually active, you may not need treatment. HOME CARE INSTRUCTIONS  Take medicines only as directed by your health care provider. Do not use herbal or alternative medicines unless your health care provider says that you can.  Use over-the-counter creams, lubricants, or moisturizers for dryness only as directed by your health care provider.  If your atrophic vaginitis is caused by menopause, discuss all of your menopausal symptoms and treatment options with your health care provider.  Do not douche.  Do not use products that can make your vagina dry. These include:  Scented feminine sprays.  Scented tampons.  Scented soaps.  If it hurts to have sex, talk with your sexual partner. SEEK MEDICAL CARE IF:  Your discharge looks different than normal.  Your vagina has an unusual smell.  You have   new symptoms.  Your symptoms do not improve with treatment.  Your symptoms get worse.   This information is not intended to replace advice given to you by your health care provider. Make sure you  discuss any questions you have with your health care provider.   Document Released: 12/19/2014 Document Reviewed: 12/19/2014 Elsevier Interactive Patient Education 2016 Elsevier Inc. Chronic Back Pain  When back pain lasts longer than 3 months, it is called chronic back pain.People with chronic back pain often go through certain periods that are more intense (flare-ups).  CAUSES Chronic back pain can be caused by wear and tear (degeneration) on different structures in your back. These structures include:  The bones of your spine (vertebrae) and the joints surrounding your spinal cord and nerve roots (facets).  The strong, fibrous tissues that connect your vertebrae (ligaments). Degeneration of these structures may result in pressure on your nerves. This can lead to constant pain. HOME CARE INSTRUCTIONS  Avoid bending, heavy lifting, prolonged sitting, and activities which make the problem worse.  Take brief periods of rest throughout the day to reduce your pain. Lying down or standing usually is better than sitting while you are resting.  Take over-the-counter or prescription medicines only as directed by your caregiver. SEEK IMMEDIATE MEDICAL CARE IF:   You have weakness or numbness in one of your legs or feet.  You have trouble controlling your bladder or bowels.  You have nausea, vomiting, abdominal pain, shortness of breath, or fainting.   This information is not intended to replace advice given to you by your health care provider. Make sure you discuss any questions you have with your health care provider.   Document Released: 09/11/2004 Document Revised: 10/27/2011 Document Reviewed: 01/22/2015 Elsevier Interactive Patient Education [employer].

## 2015-09-11 NOTE — Progress Notes (Signed)
70 y.o. G2P0 Married  Caucasian Fe here for annual exam. Menopausal no HRT. Denies vaginal bleeding. Has been treating self with coconut oil for vaginal dryness and feels this is helping some. Sees Dr. Harrington Challenger for cardiology follow up and labs/medication. Sees Maryanna Shape as PCP for aex/labs/medciation for anxiety and depression,cholesterol management/insomnia. Patient requesting Tramadol for vaginal burning. Patient dealing with chronic back pain which has been hard. Tramadol helps with it too. Patient also requesting another medication for anxiety/depression, PCP has been managing. Plans another appointment with orthopedic to evaluate her back again." Just not been a good year". No other health issues today.   No LMP recorded (lmp unknown). Patient is postmenopausal.          Sexually active: No.  The current method of family planning is post menopausal status.    Exercising: No.  exercise Smoker:  yes  Health Maintenance: Pap: 01/10/2014 Negative No Abnormal paps MMG: 01/05/2014 category 3 prob benign, 01/24/15 negative per patient,  Colonoscopy: Approx 3 years ago as per pt BMD: None TDaP: 2008 Shingles: no Pneumonia: 2016 Hep C and HIV: not done Labs: poct urine-rbc tr not symptomatic Self breast exam: done monthly   reports that she has been smoking Cigarettes.  She has a 25 pack-year smoking history. She has never used smokeless tobacco. She reports that she does not drink alcohol or use illicit drugs.  Past Medical History  Diagnosis Date  . CAD (coronary artery disease)     a. 1997 MI/PCI RCA;  b. 2007 MI/PCI of 100% RCA with Taxus DES x 3 placed, EF 55%;  c. 2010 Cath: stable anatomy;  d. 05/2012 NSTEMI/Cath/PCI: LM nl, LAD 60-76m, D1 20ost, LCX 56m, RCA 40-77m ISR, 60/95d (Treated w/ 2.75x33 Xience Xpedition DES), PDA 8m (Treated w/ PTCA), EF 60%.  . Depression   . Hyperlipidemia   . Carpal tunnel syndrome on both sides   . Numbness of foot left foot    "never recovered from  last back OR, 2009" (05/17/2012)  . GERD (gastroesophageal reflux disease)   . Arthritis     "back; probably knees" (05/17/2012)  . Chronic lower back pain   . Anginal pain ([place])   . Myocardial infarction [facility])  1997, 2007 , last 2013    x 3  . Blood transfusion without reported diagnosis     Past Surgical History  Procedure Laterality Date  . Cesarean section  1990  . Ankle surgery  years ago    left; "had to take out a floater"  . Posterior fusion lumbar spine  1962; 2009  . Coronary angioplasty with stent placement  1997; 2007, 2013    "2 + 2; + 1= total of 5  . 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 Left heart catheterization with coronary angiogram N/A 05/18/2012    Procedure: LEFT HEART CATHETERIZATION WITH CORONARY ANGIOGRAM;  Surgeon: Wellington Hampshire, MD;  Location: [facility] CATH LAB;  Service: Cardiovascular;  Laterality: N/A;    Current Outpatient Prescriptions  Medication Sig Dispense Refill  . BELSOMRA 15 MG TABS Take 1 tablet by mouth at bedtime.  0  . clopidogrel (PLAVIX) 75 MG tablet TAKE 1 TABLET BY MOUTH ONCE DAILY 30 tablet 6  . escitalopram (LEXAPRO) 20 MG tablet TAKE 1 TABLET (20 MG TOTAL) BY MOUTH DAILY. 90 tablet 1  . metoprolol (LOPRESSOR)  50 MG tablet TAKE 1/2 TABLETS (25 MG TOTAL) BY MOUTH 2 (TWO) TIMES DAILY. 90 tablet 1  . pantoprazole (PROTONIX) 40 MG tablet Take 1 tablet (40 mg total) by mouth daily. 90 tablet 3  . Red Yeast Rice Extract (RED YEAST RICE PO) Take by mouth daily.    . simvastatin (ZOCOR) 80 MG tablet Take 1 tablet (80 mg total) by mouth at bedtime. 30 tablet 5  . traMADol (ULTRAM) 50 MG tablet Take 1 tablet (50 mg total) by mouth every 6 (six) hours as needed. 180 tablet 0  . UNABLE TO FIND daily. Eye vitamin    . zolpidem (AMBIEN) 5 MG tablet Take 1 tablet (5 mg total) by mouth at bedtime  as needed. 30 tablet 0   No current facility-administered medications for this visit.    Family History  Problem Relation Age of Onset  . Coronary artery disease Mother   . Diabetes Mother   . Hypertension Mother   . Dementia Father   . Sudden death Brother     suicide  . Cancer Paternal Grandfather     esophageal    ROS:  Pertinent items are noted in HPI.  Otherwise, a comprehensive ROS was negative.  Exam:   BP 120/72 mmHg  Pulse 72  Resp 16  Ht 5\' 1"  (1.549 m)  Wt 168 lb (76.204 kg)  BMI 31.76 kg/m2  LMP  (LMP Unknown) Height: 5\' 1"  (154.9 cm) Ht Readings from Last 3 Encounters:  09/11/15 5\' 1"  (1.549 m)  08/02/15 5\' 1"  (1.549 m)  07/31/15 5\' 1"  (1.549 m)    General appearance: alert, cooperative and appears stated age Head: Normocephalic, without obvious abnormality, atraumatic Neck: no adenopathy, supple, symmetrical, trachea midline and thyroid normal to inspection and palpation Lungs: clear to auscultation bilaterally CVAT;Negative Breasts: normal appearance, no masses or tenderness, No nipple retraction or dimpling, No nipple discharge or bleeding, No axillary or supraclavicular adenopathy Heart: regular rate and rhythm Abdomen: soft, non-tender; no masses,  no organomegaly negative suprapubic Extremities: extremities normal, atraumatic, no cyanosis or edema Skin: Skin color, texture, turgor normal. No rashes or lesions, warm and dry Lymph nodes: Cervical, supraclavicular, and axillary nodes normal. No abnormal inguinal nodes palpated Neurologic: Grossly normal   Pelvic: External genitalia:  no lesions              Urethra:  normal appearing urethra with no masses, tenderness or lesions  Bladder and urethral meatus non tender              Bartholin's and Skene's: normal                 Vagina: normal appearing vagina with normal color and discharge, no lesions              Cervix: no cervical motion tenderness and no lesions, normal appearance               Pap taken: Yes.   Bimanual Exam:  Uterus:  normal size, contour, position, consistency, mobility, non-tender              Adnexa: normal adnexa and no mass, fullness, tenderness               Rectovaginal: Confirms               Anus:  normal sphincter tone, no lesions  Chaperone present: yes  A:  Well Woman with normal exam  Menopausal no HRT  Vaginal dryness improved in  appearance, but continues with vaginal burning, requesting Tramadol for discomfort. Affirm previous was negative.  R/O UTI   Hyperlipidemia/depression/insomina with PCP management  Cardiology management Coronary artery disease  Chronic back pain with orthopedic management in past, plans another appointment  Patient requesting other medication for insomnia(PCP management,she has Rx)  P:   Reviewed health and wellness pertinent to exam  Discussed need to advise if vaginal bleeding  Discussed finding of improved appearance, and not tender with exam. We discussed vaginal burning and possible use of Hydrocortisone supp. Trial. Will consider and advise. Will continue coconut oil or Olive oil for dryness. Discussed would not provide Rx for Tramadol as her medications are from her PCP.   Lab Urine micro  Reviewed UTI and warning signs to advise if occurs.  Continue to follow up with PCP as indicated. Discussed discussing with PCP that she does not feel her depression medication is not working and suggestions regarding. ? Psychiatrist consult. Patient feels this is a good idea and will discuss at PCP visit.   Follow up with MD regarding back pain.  Pap smear as above   counseled on breast self exam, stressed mammography screening, adequate intake of calcium and vitamin D, diet and exercise  return annually or prn  An After Visit Summary was printed and given to the patient.

## 2015-09-12 ENCOUNTER — Other Ambulatory Visit: Payer: Self-pay | Admitting: Certified Nurse Midwife

## 2015-09-12 ENCOUNTER — Telehealth: Payer: Self-pay

## 2015-09-12 DIAGNOSIS — N949 Unspecified condition associated with female genital organs and menstrual cycle: Secondary | ICD-10-CM

## 2015-09-12 LAB — URINALYSIS, MICROSCOPIC ONLY
Bacteria, UA: NONE SEEN [HPF]
CASTS: NONE SEEN [LPF]
Crystals: NONE SEEN [HPF]
SQUAMOUS EPITHELIAL / LPF: NONE SEEN [HPF] (ref <=5)
WBC, UA: NONE SEEN WBC/HPF (ref <=5)
Yeast: NONE SEEN [HPF]

## 2015-09-12 MED ORDER — HYDROCORTISONE ACETATE 25 MG RE SUPP: RECTAL | Status: DC

## 2015-09-12 NOTE — Telephone Encounter (Signed)
-----   Message from Regina Eck, CNM sent at 09/12/2015  8:06 AM EST ----- Notify patient that urine micro is  Negative Also was looking at another option for vaginal burning and would like her to try hydrocortisone suppositories for 7 nights.Inserting in vagina at bedtime. Stopping all other products until completed. Please advise if she agreeable and order will be placed.

## 2015-09-12 NOTE — Telephone Encounter (Signed)
lmtcb

## 2015-09-12 NOTE — Telephone Encounter (Signed)
Patient notified of results. See lab 

## 2015-09-13 LAB — IPS PAP SMEAR ONLY

## 2015-09-14 NOTE — Progress Notes (Signed)
Reviewed personally.  M. Suzanne Liliya Fullenwider, MD.  

## 2015-09-18 ENCOUNTER — Encounter: Payer: Self-pay | Admitting: Cardiology

## 2015-09-18 ENCOUNTER — Encounter: Payer: Self-pay | Admitting: Cardiovascular Disease

## 2015-09-19 ENCOUNTER — Other Ambulatory Visit: Payer: Self-pay | Admitting: Family Medicine

## 2015-09-20 NOTE — Telephone Encounter (Signed)
Ok to refill for one month  

## 2015-09-26 ENCOUNTER — Ambulatory Visit (INDEPENDENT_AMBULATORY_CARE_PROVIDER_SITE_OTHER): Payer: Managed Care, Other (non HMO)

## 2015-09-26 DIAGNOSIS — E538 Deficiency of other specified B group vitamins: Secondary | ICD-10-CM

## 2015-09-26 MED ORDER — CYANOCOBALAMIN 1000 MCG/ML IJ SOLN
1000.0000 ug | Freq: Once | INTRAMUSCULAR | Status: AC
Start: 2015-09-26 — End: 2015-09-26
  Administered 2015-09-26: 1000 ug via INTRAMUSCULAR

## 2015-10-18 ENCOUNTER — Other Ambulatory Visit: Payer: Self-pay | Admitting: Adult Health

## 2015-10-19 NOTE — Telephone Encounter (Signed)
Ok to refill for one month  

## 2015-10-25 ENCOUNTER — Telehealth: Payer: Self-pay | Admitting: Internal Medicine

## 2015-10-25 NOTE — Telephone Encounter (Signed)
Request for surgical clearance:  1. What type of surgery is being performed? Spinal Nerve Block   2. When is this surgery scheduled? Have not been scheduled   3. Are there any medications that need to be held prior to surgery and how long?general clearance   4. Name of physician performing surgery? Dr Suella Broad   5. What is your office phone and fax number? [phone] option 3 and Fax#-[phone]-Att: Dr Nelva Bush  6.

## 2015-10-25 NOTE — Telephone Encounter (Signed)
Pt is OK to proceed with nerve block Stop plavix 5 days prior.   Resume after

## 2015-10-26 NOTE — Telephone Encounter (Signed)
Printed and placed in nurse fax bin in medical records to be faxed.

## 2015-11-15 ENCOUNTER — Ambulatory Visit (INDEPENDENT_AMBULATORY_CARE_PROVIDER_SITE_OTHER): Payer: Managed Care, Other (non HMO) | Admitting: Adult Health

## 2015-11-15 ENCOUNTER — Encounter: Payer: Self-pay | Admitting: Adult Health

## 2015-11-15 ENCOUNTER — Other Ambulatory Visit: Payer: Self-pay | Admitting: Internal Medicine

## 2015-11-15 VITALS — BP 130/70 | Temp 98.0°F | Ht 61.0 in | Wt 173.0 lb

## 2015-11-15 DIAGNOSIS — G4700 Insomnia, unspecified: Secondary | ICD-10-CM | POA: Diagnosis not present

## 2015-11-15 DIAGNOSIS — J0101 Acute recurrent maxillary sinusitis: Secondary | ICD-10-CM

## 2015-11-15 DIAGNOSIS — E538 Deficiency of other specified B group vitamins: Secondary | ICD-10-CM | POA: Diagnosis not present

## 2015-11-15 MED ORDER — AZITHROMYCIN 250 MG PO TABS: ORAL_TABLET | ORAL | Status: DC

## 2015-11-15 MED ORDER — ZOLPIDEM TARTRATE 5 MG PO TABS: 5.0000 mg | ORAL_TABLET | Freq: Every day | ORAL | Status: DC

## 2015-11-15 MED ORDER — CYANOCOBALAMIN 1000 MCG/ML IJ SOLN
1000.0000 ug | Freq: Once | INTRAMUSCULAR | Status: AC
  Administered 2015-11-15: 1000 ug via INTRAMUSCULAR

## 2015-11-15 NOTE — Progress Notes (Signed)
Subjective:    Patient ID: Bianca [name], female    DOB: [birth year], 70 y.o.   MRN: [id number]  Sinusitis This is a recurrent problem. The current episode started more than 1 month ago. The problem has been waxing and waning since onset. There has been no fever. The fever has been present for less than 1 day. Associated symptoms include congestion, headaches, sinus pressure and a sore throat. Pertinent negatives include no chills. Past treatments include saline sprays and oral decongestants. The treatment provided moderate relief.   She also needs her Vitamin B12 injection, additionally, she needs a refill of Ambien.   Review of Systems  Constitutional: Positive for fever and fatigue. Negative for chills, activity change and appetite change.  HENT: Positive for congestion, postnasal drip, rhinorrhea, sinus pressure and sore throat.   Respiratory: Negative.   Cardiovascular: Negative.   Neurological: Positive for headaches.  All other systems reviewed and are negative.  Past Medical History  Diagnosis Date  . CAD (coronary artery disease)     a. 1997 MI/PCI RCA;  b. 2007 MI/PCI of 100% RCA with Taxus DES x 3 placed, EF 55%;  c. 2010 Cath: stable anatomy;  d. 05/2012 NSTEMI/Cath/PCI: LM nl, LAD 60-55m, D1 20ost, LCX 36m, RCA 40-73m ISR, 60/95d (Treated w/ 2.75x33 Xience Xpedition DES), PDA 93m (Treated w/ PTCA), EF 60%.  . Depression   . Hyperlipidemia   . Carpal tunnel syndrome on both sides   . Numbness of foot left foot    "never recovered from last back OR, 2009" (05/17/2012)  . GERD (gastroesophageal reflux disease)   . Arthritis     "back; probably knees" (05/17/2012)  . Chronic lower back pain   . Anginal pain ([place])   . Myocardial infarction [facility])  1997, 2007 , last 2013    x 3  . Blood transfusion without reported diagnosis     Social History   Social History  . Marital Status: Married    Spouse Name: N/A  . Number of Children: N/A  . Years of Education: N/A    Occupational History  . Not on file.   Social History Main Topics  . Smoking status: Current Every Day Smoker -- 0.50 packs/day for 50 years    Types: Cigarettes  . Smokeless tobacco: Never Used     Comment: Working on it at this time.//kg  . Alcohol Use: No  . Drug Use: No  . Sexual Activity: Not Currently    Birth Control/ Protection: Post-menopausal   Other Topics Concern  . Not on file   Social History Narrative   Is a homemaker   Married for 68 years    Has a daughter and son    Pets: Two dogs and a Neurosurgeon   Likes to garden ( flower beds).           Past Surgical History  Procedure Laterality Date  . Cesarean section  1990  . Ankle surgery  years ago    left; "had to take out a floater"  . Posterior fusion lumbar spine  1962; 2009  . Coronary angioplasty with stent placement  1997; 2007, 2013    "2 + 2; + 1= total of 5  . 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 Left heart catheterization with coronary angiogram N/A 05/18/2012    Procedure: LEFT  HEART CATHETERIZATION WITH CORONARY ANGIOGRAM;  Surgeon: Wellington Hampshire, MD;  Location: [facility] CATH LAB;  Service: Cardiovascular;  Laterality: N/A;  . Carpal tunnel release Left 10/12016    Family History  Problem Relation Age of Onset  . Coronary artery disease Mother   . Diabetes Mother   . Hypertension Mother   . Dementia Father   . Sudden death Brother     suicide  . Cancer Paternal Grandfather     esophageal    Allergies  Allergen Reactions  . Oxycodone Hcl Itching and Other (See Comments)    confusion  . Penicillins Rash  . Macrobid [Nitrofurantoin Monohyd Macro] Nausea And Vomiting  . Sulfa Antibiotics Nausea Only    Current Outpatient Prescriptions on File Prior to Visit  Medication Sig Dispense Refill  . escitalopram (LEXAPRO) 20 MG tablet TAKE 1  TABLET (20 MG TOTAL) BY MOUTH DAILY. 90 tablet 1  . hydrocortisone (ANUSOL-HC) 25 MG suppository Insert one suppository vaginally at night, for 7 nights 7 suppository 0  . metoprolol (LOPRESSOR) 50 MG tablet TAKE 1/2 TABLETS (25 MG TOTAL) BY MOUTH 2 (TWO) TIMES DAILY. 90 tablet 1  . Red Yeast Rice Extract (RED YEAST RICE PO) Take by mouth daily.    . simvastatin (ZOCOR) 80 MG tablet Take 1 tablet (80 mg total) by mouth at bedtime. 30 tablet 5  . UNABLE TO FIND daily. Eye vitamin    . clopidogrel (PLAVIX) 75 MG tablet TAKE 1 TABLET BY MOUTH ONCE DAILY 30 tablet 3   No current facility-administered medications on file prior to visit.    BP 130/70 mmHg  Temp(Src) 98 F (36.7 C) (Oral)  Ht 5\' 1"  (1.549 m)  Wt 173 lb (78.472 kg)  BMI 32.70 kg/m2  LMP  (LMP Unknown)       Objective:   Physical Exam  Constitutional: She is oriented to person, place, and time. She appears well-developed and well-nourished. No distress.  HENT:  Head: Normocephalic and atraumatic.  Right Ear: Hearing, tympanic membrane, external ear and ear canal normal.  Left Ear: Hearing, tympanic membrane, external ear and ear canal normal.  Nose: Right sinus exhibits no maxillary sinus tenderness and no frontal sinus tenderness. Left sinus exhibits maxillary sinus tenderness. Left sinus exhibits no frontal sinus tenderness.  Mouth/Throat: Oropharynx is clear and moist.  Eyes: Conjunctivae and EOM are normal. Pupils are equal, round, and reactive to light. Right eye exhibits no discharge. Left eye exhibits no discharge.  Neck: Normal range of motion. Neck supple.  Cardiovascular: Normal rate, regular rhythm, normal heart sounds and intact distal pulses.  Exam reveals no gallop and no friction rub.   No murmur heard. Pulmonary/Chest: Effort normal and breath sounds normal. No respiratory distress. She has no wheezes. She has no rales. She exhibits no tenderness.  Lymphadenopathy:    She has no cervical adenopathy.    Neurological: She is alert and oriented to person, place, and time.  Skin: Skin is warm and dry. No rash noted. She is not diaphoretic. No erythema. No pallor.  Psychiatric: She has a normal mood and affect. Her behavior is normal. Judgment and thought content normal.  Nursing note and vitals reviewed.     Assessment & Plan:  1. Acute recurrent maxillary sinusitis - azithromycin (ZITHROMAX Z-PAK) 250 MG tablet; 2 tablets today, one tablet days 2-4.  Dispense: 6 each; Refill: 0 -  Follow-up if no improvement in 2 or 3 days -  Consider referral to ear nose and throat  if she has any additional sinus infections -  Use Flonase and normal saline nasal spray -   Stay hydrated and rest 2. B12 deficiency - cyanocobalamin ((VITAMIN B-12)) injection 1,000 mcg; Inject 1 mL (1,000 mcg total) into the muscle once.  3. Insomnia - zolpidem (AMBIEN) 5 MG tablet; Take 1 tablet (5 mg total) by mouth at bedtime.  Dispense: 30 tablet; Refill: 0

## 2015-11-21 ENCOUNTER — Other Ambulatory Visit: Payer: Self-pay | Admitting: Adult Health

## 2015-11-21 NOTE — Telephone Encounter (Signed)
Ok to refill for 6 months 

## 2015-11-21 NOTE — Telephone Encounter (Signed)
Ok to refill 

## 2015-11-26 ENCOUNTER — Other Ambulatory Visit: Payer: Self-pay | Admitting: Adult Health

## 2015-11-28 ENCOUNTER — Encounter: Payer: Self-pay | Admitting: Adult Health

## 2015-11-28 ENCOUNTER — Ambulatory Visit (INDEPENDENT_AMBULATORY_CARE_PROVIDER_SITE_OTHER): Payer: Managed Care, Other (non HMO) | Admitting: Adult Health

## 2015-11-28 VITALS — BP 148/70 | HR 59 | Temp 97.9°F | Wt 169.0 lb

## 2015-11-28 DIAGNOSIS — Z720 Tobacco use: Secondary | ICD-10-CM | POA: Diagnosis not present

## 2015-11-28 DIAGNOSIS — R300 Dysuria: Secondary | ICD-10-CM

## 2015-11-28 DIAGNOSIS — R6889 Other general symptoms and signs: Secondary | ICD-10-CM

## 2015-11-28 LAB — POC URINALSYSI DIPSTICK (AUTOMATED)
BILIRUBIN UA: NEGATIVE
GLUCOSE UA: NEGATIVE
KETONES UA: NEGATIVE
LEUKOCYTES UA: NEGATIVE
Nitrite, UA: NEGATIVE
PH UA: 6
Protein, UA: NEGATIVE
Spec Grav, UA: 1.025
Urobilinogen, UA: 0.2

## 2015-11-28 LAB — URINALYSIS, MICROSCOPIC ONLY

## 2015-11-28 LAB — POCT INFLUENZA A: RAPID INFLUENZA A AGN: NEGATIVE

## 2015-11-28 MED ORDER — MIRABEGRON ER 50 MG PO TB24: 50.0000 mg | ORAL_TABLET | Freq: Every day | ORAL | Status: DC

## 2015-11-28 NOTE — Progress Notes (Signed)
Pre visit review using our clinic review tool, if applicable. No additional management support is needed unless otherwise documented below in the visit note. 

## 2015-11-28 NOTE — Progress Notes (Addendum)
   Subjective:    Patient ID: Bianca [name], female    DOB: 12-Sep-1945, 70 y.o.   MRN: [id number]  HPI 70 year old female who presents to the office today for one week of " I just feel sick" Her symptoms include chest congestion, productive cough, weakness, sore throat, sinus pain and pressure, ear pain, generalized body aches, possible subjective fever, chills, one night of diarrhea. She has vomited once - strikes this up to coughing.    She denies nausea or diarrhea.   She continues to smoke 0.5 packs a day.   Review of Systems  Constitutional: Positive for fever, chills, diaphoresis, activity change, appetite change and fatigue.  HENT: Positive for congestion, ear pain, postnasal drip, rhinorrhea, sinus pressure and sore throat. Negative for ear discharge and tinnitus.   Cardiovascular: Negative.   Gastrointestinal: Negative.   Genitourinary: Positive for urgency and frequency. Negative for dysuria and difficulty urinating.  Musculoskeletal: Negative.   Neurological: Negative.  Negative for dizziness, weakness and headaches.  Psychiatric/Behavioral: Negative.        Objective:   Physical Exam  Constitutional: She is oriented to person, place, and time.  HENT:  Head: Normocephalic and atraumatic.  Right Ear: External ear normal.  Left Ear: External ear normal.  Nose: Nose normal.  Mouth/Throat: Oropharynx is clear and moist. No oropharyngeal exudate.  Neck: Normal range of motion. Neck supple.  Cardiovascular: Normal rate, regular rhythm, normal heart sounds and intact distal pulses.  Exam reveals no gallop and no friction rub.   No murmur heard. Pulmonary/Chest: Effort normal and breath sounds normal. No respiratory distress. She has no wheezes. She has no rales. She exhibits no tenderness.  Musculoskeletal: Normal range of motion. She exhibits no edema or tenderness.  Lymphadenopathy:    She has no cervical adenopathy.  Neurological: She is alert and oriented to person,  place, and time. She has normal reflexes.  Skin: Skin is warm and dry. No rash noted. No erythema. No pallor.  Psychiatric: She has a normal mood and affect. Her behavior is normal. Judgment and thought content normal.  Nursing note and vitals reviewed.     Assessment & Plan:  1. Dysuria - This is a chronic issue and has been evaluated in the past by Urology  - POCT Urinalysis Dipstick (Automated)- _+ blood - Urine Microscopic Only  2. Flu-like symptoms - POCT Influenza A- Negative - Likely viral condition.  INo need for antibiotics at this time.  - Follow up if no improvement.   3. Tobacco use  - She needs to quit smoking. She continues to have sinus infections and feels bad all the time.  - She does not want any help with quitting. She will try the gum or patch. We spent about 3 minutes reviewing options for quitting smoking. She refused all options that I presented.  - Follow up as needed.   [name], NP

## 2015-11-29 ENCOUNTER — Other Ambulatory Visit: Payer: Self-pay | Admitting: Adult Health

## 2015-11-29 MED ORDER — CIPROFLOXACIN HCL 500 MG PO TABS: 500.0000 mg | ORAL_TABLET | Freq: Two times a day (BID) | ORAL | Status: DC

## 2015-12-03 ENCOUNTER — Ambulatory Visit: Payer: Medicare Other | Admitting: Family Medicine

## 2015-12-10 ENCOUNTER — Telehealth: Payer: Self-pay | Admitting: Certified Nurse Midwife

## 2015-12-10 NOTE — Telephone Encounter (Signed)
Patient having vaginal burning and pain.

## 2015-12-10 NOTE — Telephone Encounter (Signed)
Spoke with patient. Patient states that she has "always" had vaginal burning. On 12/07/2015 she began to have vaginal pain. "It is on each side." Denies any vaginal discharge or bleeding. Denies urinary symptoms, fevers, or chills. Reports she was seen with her PCP on 11/28/2015 for flu symptoms and was told "I might have had a UTI." States she took Cipro for 3 days without relief. Advised she will need to be seen in the office for further evaluation with Melvia Heaps CNM. She is agreeable and verbalizes understanding. Appointment scheduled for 12/11/2015 at 2 pm with Melvia Heaps CNM. She is agreeable to date and time. Aware if her symptoms worsen or she develops any new symptoms she will need to be seen earlier in our office, with urgent care, or ER.  Routing to provider for final review. Patient agreeable to disposition. Will close encounter.

## 2015-12-11 ENCOUNTER — Encounter: Payer: Self-pay | Admitting: Certified Nurse Midwife

## 2015-12-11 ENCOUNTER — Ambulatory Visit (INDEPENDENT_AMBULATORY_CARE_PROVIDER_SITE_OTHER): Payer: Managed Care, Other (non HMO) | Admitting: Certified Nurse Midwife

## 2015-12-11 VITALS — BP 128/60 | HR 68 | Temp 97.9°F | Resp 16 | Ht 61.0 in | Wt 168.0 lb

## 2015-12-11 DIAGNOSIS — B373 Candidiasis of vulva and vagina: Secondary | ICD-10-CM

## 2015-12-11 DIAGNOSIS — R319 Hematuria, unspecified: Secondary | ICD-10-CM

## 2015-12-11 DIAGNOSIS — N949 Unspecified condition associated with female genital organs and menstrual cycle: Secondary | ICD-10-CM

## 2015-12-11 DIAGNOSIS — R300 Dysuria: Secondary | ICD-10-CM

## 2015-12-11 DIAGNOSIS — B3731 Acute candidiasis of vulva and vagina: Secondary | ICD-10-CM

## 2015-12-11 LAB — POCT URINALYSIS DIPSTICK
Bilirubin, UA: NEGATIVE
GLUCOSE UA: NEGATIVE
Ketones, UA: NEGATIVE
Leukocytes, UA: NEGATIVE
NITRITE UA: NEGATIVE
PROTEIN UA: NEGATIVE
UROBILINOGEN UA: NEGATIVE
pH, UA: 5

## 2015-12-11 MED ORDER — TERCONAZOLE 0.4 % VA CREA: 1.0000 | TOPICAL_CREAM | Freq: Every day | VAGINAL | Status: DC

## 2015-12-11 NOTE — Progress Notes (Signed)
70 y.o. Married Caucasian female G2P2 here with complaint of vaginal burning, with onset  on 2-3 weeks. Patient has been using coconut oil for vaginal dryness with some change. Has noted increase in vaginal burning after treatment with Erythromycin for sinusitis. Patient was also treated with Cipro for UTI. Patient is feeling burning when urine touches skin only.. Patient has  urinary frequency with urination as a rule, and has not changed.  Patient denies fever, chills, or nausea or back pain.. Menopausal with vaginal dryness. Patient working on  water intake and decrease soda intake. Does have stress incontinence, but tolerates and does not desire further treatment. Uses Depends only as needed.  O: Healthy female WDWN Affect: Normal, orientation x 3 Skin : warm and dry CVAT: negative bilateral Abdomen: negative for suprapubic tenderness  Pelvic exam: External genital area: normal, no lesions, atrophic appearance Bladder,Urethra non  tender, Urethral meatus:non tender, no redness Vagina:scant thick vaginal discharge, atrophic appearance, slightly tender with touch and redness noted at fourchette.  Wet prep taken,  affirm taken Cervix: normal, non tender Uterus:normal,non tender Adnexa: normal non tender, no fullness or masses   A:Normal pelvic exam Yeast vulvitis R/O UTI poct urine- rbc tr history of microscopic hematuria, recent treatment for UTI  P:reviewed findings of normal pelvic exam.  Reviewed findings of Yeast vagintis and need for treatment. ?Antiobiotic induced. [id number] vaginal cream with instructions. Will treat per affirm if indicated. Resume coconut oil after treatment. 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 Estrogen use contraindicated due to previous cardiac problems [id number], Reviewed warning signs and symptoms of UTI and need to advise if occurring. Encouraged to limit soda, tea, and coffee and be sure to increase water  intake.   RV prn

## 2015-12-11 NOTE — Patient Instructions (Signed)

## 2015-12-12 LAB — URINALYSIS, MICROSCOPIC ONLY
Bacteria, UA: NONE SEEN [HPF]
Casts: NONE SEEN [LPF]
Crystals: NONE SEEN [HPF]
Yeast: NONE SEEN [HPF]

## 2015-12-12 LAB — WET PREP BY MOLECULAR PROBE
CANDIDA SPECIES: NEGATIVE
GARDNERELLA VAGINALIS: NEGATIVE
TRICHOMONAS VAG: NEGATIVE

## 2015-12-12 NOTE — Addendum Note (Signed)
Addended by: Apolinar Junes on: 12/12/2015 04:59 PM   Modules accepted: Miquel Dunn

## 2015-12-13 NOTE — Addendum Note (Signed)
Addended by: Apolinar Junes on: 12/13/2015 08:41 AM   Modules accepted: Miquel Dunn

## 2015-12-13 NOTE — Addendum Note (Signed)
Addended by: Apolinar Junes on: 12/13/2015 08:43 AM   Modules accepted: Miquel Dunn

## 2015-12-14 NOTE — Progress Notes (Signed)
Encounter reviewed. Discussed with Mrs Hollice Espy that I think this patient is a candidate for vaginal estrogen if desired. A H/O cardiac disease is not a contraindication to vaginal estrogen.  Sumner Boast, MD

## 2015-12-19 ENCOUNTER — Telehealth: Payer: Self-pay | Admitting: Adult Health

## 2015-12-19 NOTE — Telephone Encounter (Signed)
Ok to refill 

## 2015-12-19 NOTE — Telephone Encounter (Signed)
Pt needs refill on zolpidem 5 mg send to [facility] [address]

## 2015-12-20 ENCOUNTER — Other Ambulatory Visit: Payer: Self-pay

## 2015-12-20 DIAGNOSIS — G4700 Insomnia, unspecified: Secondary | ICD-10-CM

## 2015-12-20 MED ORDER — ZOLPIDEM TARTRATE 5 MG PO TABS: 5.0000 mg | ORAL_TABLET | Freq: Every day | ORAL | Status: DC

## 2015-12-20 NOTE — Telephone Encounter (Signed)
Rx called in as directed.   

## 2015-12-21 ENCOUNTER — Ambulatory Visit: Payer: Medicare Other

## 2015-12-24 ENCOUNTER — Emergency Department (HOSPITAL_COMMUNITY): Payer: Managed Care, Other (non HMO)

## 2015-12-24 ENCOUNTER — Emergency Department (HOSPITAL_COMMUNITY)
Admission: EM | Admit: 2015-12-24 | Discharge: 2015-12-25 | Disposition: A | Discharge status: Home / Self Care | Payer: Managed Care, Other (non HMO) | Source: Home / Self Care

## 2015-12-24 ENCOUNTER — Encounter (HOSPITAL_COMMUNITY): Payer: Self-pay | Admitting: Emergency Medicine

## 2015-12-24 DIAGNOSIS — E785 Hyperlipidemia, unspecified: Secondary | ICD-10-CM | POA: Diagnosis not present

## 2015-12-24 DIAGNOSIS — F329 Major depressive disorder, single episode, unspecified: Secondary | ICD-10-CM | POA: Diagnosis not present

## 2015-12-24 DIAGNOSIS — Z880 Allergy status to penicillin: Secondary | ICD-10-CM | POA: Diagnosis not present

## 2015-12-24 DIAGNOSIS — G8929 Other chronic pain: Secondary | ICD-10-CM

## 2015-12-24 DIAGNOSIS — F17210 Nicotine dependence, cigarettes, uncomplicated: Secondary | ICD-10-CM | POA: Insufficient documentation

## 2015-12-24 DIAGNOSIS — Z8719 Personal history of other diseases of the digestive system: Secondary | ICD-10-CM | POA: Diagnosis not present

## 2015-12-24 DIAGNOSIS — Z9889 Other specified postprocedural states: Secondary | ICD-10-CM | POA: Diagnosis not present

## 2015-12-24 DIAGNOSIS — I25119 Atherosclerotic heart disease of native coronary artery with unspecified angina pectoris: Secondary | ICD-10-CM | POA: Insufficient documentation

## 2015-12-24 DIAGNOSIS — Z79899 Other long term (current) drug therapy: Secondary | ICD-10-CM | POA: Diagnosis not present

## 2015-12-24 DIAGNOSIS — I252 Old myocardial infarction: Secondary | ICD-10-CM | POA: Insufficient documentation

## 2015-12-24 DIAGNOSIS — Z7902 Long term (current) use of antithrombotics/antiplatelets: Secondary | ICD-10-CM | POA: Diagnosis not present

## 2015-12-24 DIAGNOSIS — R0602 Shortness of breath: Secondary | ICD-10-CM

## 2015-12-24 DIAGNOSIS — J209 Acute bronchitis, unspecified: Secondary | ICD-10-CM | POA: Diagnosis not present

## 2015-12-24 DIAGNOSIS — Z8739 Personal history of other diseases of the musculoskeletal system and connective tissue: Secondary | ICD-10-CM | POA: Diagnosis not present

## 2015-12-24 DIAGNOSIS — Z792 Long term (current) use of antibiotics: Secondary | ICD-10-CM | POA: Diagnosis not present

## 2015-12-24 DIAGNOSIS — Z9861 Coronary angioplasty status: Secondary | ICD-10-CM | POA: Diagnosis not present

## 2015-12-24 NOTE — ED Notes (Signed)
Pt states that she has had a URI symptoms x more than a week and it has moved more into her chest now and she feels SOB. Alert and oriented.

## 2015-12-24 NOTE — ED Notes (Signed)
Did not answer

## 2015-12-25 ENCOUNTER — Encounter (HOSPITAL_COMMUNITY): Payer: Self-pay | Admitting: *Deleted

## 2015-12-25 ENCOUNTER — Emergency Department (HOSPITAL_COMMUNITY)
Admission: EM | Admit: 2015-12-25 | Discharge: 2015-12-25 | Disposition: A | Discharge status: Home / Self Care | Payer: Managed Care, Other (non HMO) | Attending: Emergency Medicine | Admitting: Emergency Medicine

## 2015-12-25 DIAGNOSIS — F329 Major depressive disorder, single episode, unspecified: Secondary | ICD-10-CM | POA: Insufficient documentation

## 2015-12-25 DIAGNOSIS — Z9861 Coronary angioplasty status: Secondary | ICD-10-CM | POA: Insufficient documentation

## 2015-12-25 DIAGNOSIS — Z79899 Other long term (current) drug therapy: Secondary | ICD-10-CM | POA: Insufficient documentation

## 2015-12-25 DIAGNOSIS — I252 Old myocardial infarction: Secondary | ICD-10-CM | POA: Insufficient documentation

## 2015-12-25 DIAGNOSIS — Z8719 Personal history of other diseases of the digestive system: Secondary | ICD-10-CM | POA: Insufficient documentation

## 2015-12-25 DIAGNOSIS — J40 Bronchitis, not specified as acute or chronic: Secondary | ICD-10-CM

## 2015-12-25 DIAGNOSIS — G8929 Other chronic pain: Secondary | ICD-10-CM | POA: Insufficient documentation

## 2015-12-25 DIAGNOSIS — Z792 Long term (current) use of antibiotics: Secondary | ICD-10-CM | POA: Insufficient documentation

## 2015-12-25 DIAGNOSIS — I25119 Atherosclerotic heart disease of native coronary artery with unspecified angina pectoris: Secondary | ICD-10-CM | POA: Insufficient documentation

## 2015-12-25 DIAGNOSIS — Z8739 Personal history of other diseases of the musculoskeletal system and connective tissue: Secondary | ICD-10-CM | POA: Insufficient documentation

## 2015-12-25 DIAGNOSIS — Z720 Tobacco use: Secondary | ICD-10-CM

## 2015-12-25 DIAGNOSIS — J209 Acute bronchitis, unspecified: Secondary | ICD-10-CM | POA: Insufficient documentation

## 2015-12-25 DIAGNOSIS — E785 Hyperlipidemia, unspecified: Secondary | ICD-10-CM | POA: Insufficient documentation

## 2015-12-25 DIAGNOSIS — Z880 Allergy status to penicillin: Secondary | ICD-10-CM | POA: Insufficient documentation

## 2015-12-25 DIAGNOSIS — F17210 Nicotine dependence, cigarettes, uncomplicated: Secondary | ICD-10-CM | POA: Insufficient documentation

## 2015-12-25 DIAGNOSIS — Z7902 Long term (current) use of antithrombotics/antiplatelets: Secondary | ICD-10-CM | POA: Insufficient documentation

## 2015-12-25 DIAGNOSIS — Z9889 Other specified postprocedural states: Secondary | ICD-10-CM | POA: Insufficient documentation

## 2015-12-25 LAB — CBC
HEMATOCRIT: 37.2 % (ref 36.0–46.0)
HEMOGLOBIN: 13.2 g/dL (ref 12.0–15.0)
MCH: 32 pg (ref 26.0–34.0)
MCHC: 35.5 g/dL (ref 30.0–36.0)
MCV: 90.1 fL (ref 78.0–100.0)
Platelets: 255 10*3/uL (ref 150–400)
RBC: 4.13 MIL/uL (ref 3.87–5.11)
RDW: 12.3 % (ref 11.5–15.5)
WBC: 9.3 10*3/uL (ref 4.0–10.5)

## 2015-12-25 LAB — BASIC METABOLIC PANEL
ANION GAP: 11 (ref 5–15)
BUN: 6 mg/dL (ref 6–20)
CALCIUM: 9.5 mg/dL (ref 8.9–10.3)
CHLORIDE: 104 mmol/L (ref 101–111)
CO2: 23 mmol/L (ref 22–32)
Creatinine, Ser: 0.7 mg/dL (ref 0.44–1.00)
GFR calc Af Amer: >60 mL/min (ref >60)
Glucose, Bld: 85 mg/dL (ref 65–99)
POTASSIUM: 4 mmol/L (ref 3.5–5.1)
Sodium: 138 mmol/L (ref 135–145)

## 2015-12-25 LAB — I-STAT TROPONIN, ED: TROPONIN I, POC: 0.01 ng/mL (ref 0.00–0.08)

## 2015-12-25 MED ORDER — DOXYCYCLINE HYCLATE 100 MG PO CAPS: 100.0000 mg | ORAL_CAPSULE | Freq: Two times a day (BID) | ORAL | Status: DC

## 2015-12-25 NOTE — ED Notes (Signed)
Pt reports she has been having problem with SOB and chest heaviness for a while now.  Became severe x 4 days ago.  States her doctors kept saying it's bronchitis but is not getting any better.  Pt reports chest pain is non-radiating.  Pt also reports nausea.  Pt also reports recurrent UTI and is on abx.  Pt has had MI's in the past, last one was 2013.

## 2015-12-25 NOTE — Discharge Instructions (Signed)
Stop the Macrobid antibiotic  Smoking Cessation, Tips for Success If you are ready to quit smoking, congratulations! You have chosen to help yourself be healthier. Cigarettes bring nicotine, tar, carbon monoxide, and other irritants into your body. Your lungs, heart, and blood vessels will be able to work better without these poisons. There are many different ways to quit smoking. Nicotine gum, nicotine patches, a nicotine inhaler, or nicotine nasal spray can help with physical craving. Hypnosis, support groups, and medicines help break the habit of smoking. WHAT THINGS CAN I DO TO MAKE QUITTING EASIER?  Here are some tips to help you quit for good:  Pick a date when you will quit smoking completely. Tell all of your friends and family about your plan to quit on that date.  Do not try to slowly cut down on the number of cigarettes you are smoking. Pick a quit date and quit smoking completely starting on that day.  Throw away all cigarettes.   Clean and remove all ashtrays from your home, work, and car.  On a card, write down your reasons for quitting. Carry the card with you and read it when you get the urge to smoke.  Cleanse your body of nicotine. Drink enough water and fluids to keep your urine clear or pale yellow. Do this after quitting to flush the nicotine from your body.  Learn to predict your moods. Do not let a bad situation be your excuse to have a cigarette. Some situations in your life might tempt you into wanting a cigarette.  Never have "just one" cigarette. It leads to wanting another and another. Remind yourself of your decision to quit.  Change habits associated with smoking. If you smoked while driving or when feeling stressed, try other activities to replace smoking. Stand up when drinking your coffee. Brush your teeth after eating. Sit in a different chair when you read the paper. Avoid alcohol while trying to quit, and try to drink fewer caffeinated beverages. Alcohol  and caffeine may urge you to smoke.  Avoid foods and drinks that can trigger a desire to smoke, such as sugary or spicy foods and alcohol.  Ask people who smoke not to smoke around you.  Have something planned to do right after eating or having a cup of coffee. For example, plan to take a walk or exercise.  Try a relaxation exercise to calm you down and decrease your stress. Remember, you may be tense and nervous for the first 2 weeks after you quit, but this will pass.  Find new activities to keep your hands busy. Play with a pen, coin, or rubber band. Doodle or draw things on paper.  Brush your teeth right after eating. This will help cut down on the craving for the taste of tobacco after meals. You can also try mouthwash.   Use oral substitutes in place of cigarettes. Try using lemon drops, carrots, cinnamon sticks, or chewing gum. Keep them handy so they are available when you have the urge to smoke.  When you have the urge to smoke, try deep breathing.  Designate your home as a nonsmoking area.  If you are a heavy smoker, ask your health care provider about a prescription for nicotine chewing gum. It can ease your withdrawal from nicotine.  Reward yourself. Set aside the cigarette money you save and buy yourself something nice.  Look for support from others. Join a support group or smoking cessation program. Ask someone at home or at work to  help you with your plan to quit smoking.  Always ask yourself, "Do I need this cigarette or is this just a reflex?" Tell yourself, "Today, I choose not to smoke," or "I do not want to smoke." You are reminding yourself of your decision to quit.  Do not replace cigarette smoking with electronic cigarettes (commonly called e-cigarettes). The safety of e-cigarettes is unknown, and some may contain harmful chemicals.  If you relapse, do not give up! Plan ahead and think about what you will do the next time you get the urge to smoke. HOW WILL I  FEEL WHEN I QUIT SMOKING? You may have symptoms of withdrawal because your body is used to nicotine (the addictive substance in cigarettes). You may crave cigarettes, be irritable, feel very hungry, cough often, get headaches, or have difficulty concentrating. The withdrawal symptoms are only temporary. They are strongest when you first quit but will go away within 10-14 days. When withdrawal symptoms occur, stay in control. Think about your reasons for quitting. Remind yourself that these are signs that your body is healing and getting used to being without cigarettes. Remember that withdrawal symptoms are easier to treat than the major diseases that smoking can cause.  Even after the withdrawal is over, expect periodic urges to smoke. However, these cravings are generally short lived and will go away whether you smoke or not. Do not smoke! WHAT RESOURCES ARE AVAILABLE TO HELP ME QUIT SMOKING? Your health care provider can direct you to community resources or hospitals for support, which may include:  Group support.  Education.  Hypnosis.  Therapy.   This information is not intended to replace advice given to you by your health care provider. Make sure you discuss any questions you have with your health care provider.   Document Released: 05/02/2004 Document Revised: 08/25/2014 Document Reviewed: 01/20/2013 Elsevier Interactive Patient Education 2016 Elsevier Inc. Acute Bronchitis Bronchitis is inflammation of the airways that extend from the windpipe into the lungs (bronchi). The inflammation often causes mucus to develop. This leads to a cough, which is the most common symptom of bronchitis.  In acute bronchitis, the condition usually develops suddenly and goes away over time, usually in a couple weeks. Smoking, allergies, and asthma can make bronchitis worse. Repeated episodes of bronchitis may cause further lung problems.  CAUSES Acute bronchitis is most often caused by the same virus  that causes a cold. The virus can spread from person to person (contagious) through coughing, sneezing, and touching contaminated objects. SIGNS AND SYMPTOMS   Cough.   Fever.   Coughing up mucus.   Body aches.   Chest congestion.   Chills.   Shortness of breath.   Sore throat.  DIAGNOSIS  Acute bronchitis is usually diagnosed through a physical exam. Your health care provider will also ask you questions about your medical history. Tests, such as chest X-rays, are sometimes done to rule out other conditions.  TREATMENT  Acute bronchitis usually goes away in a couple weeks. Oftentimes, no medical treatment is necessary. Medicines are sometimes given for relief of fever or cough. Antibiotic medicines are usually not needed but may be prescribed in certain situations. In some cases, an inhaler may be recommended to help reduce shortness of breath and control the cough. A cool mist vaporizer may also be used to help thin bronchial secretions and make it easier to clear the chest.  HOME CARE INSTRUCTIONS  Get plenty of rest.   Drink enough fluids to keep your urine clear  or pale yellow (unless you have a medical condition that requires fluid restriction). Increasing fluids may help thin your respiratory secretions (sputum) and reduce chest congestion, and it will prevent dehydration.   Take medicines only as directed by your health care provider.  If you were prescribed an antibiotic medicine, finish it all even if you start to feel better.  Avoid smoking and secondhand smoke. Exposure to cigarette smoke or irritating chemicals will make bronchitis worse. If you are a smoker, consider using nicotine gum or skin patches to help control withdrawal symptoms. Quitting smoking will help your lungs heal faster.   Reduce the chances of another bout of acute bronchitis by washing your hands frequently, avoiding people with cold symptoms, and trying not to touch your hands to your  mouth, nose, or eyes.   Keep all follow-up visits as directed by your health care provider.  SEEK MEDICAL CARE IF: Your symptoms do not improve after 1 week of treatment.  SEEK IMMEDIATE MEDICAL CARE IF:  You develop an increased fever or chills.   You have chest pain.   You have severe shortness of breath.  You have bloody sputum.   You develop dehydration.  You faint or repeatedly feel like you are going to pass out.  You develop repeated vomiting.  You develop a severe headache. MAKE SURE YOU:   Understand these instructions.  Will watch your condition.  Will get help right away if you are not doing well or get worse.   This information is not intended to replace advice given to you by your health care provider. Make sure you discuss any questions you have with your health care provider.   Document Released: 09/11/2004 Document Revised: 08/25/2014 Document Reviewed: 01/25/2013 Elsevier Interactive Patient Education [employer].

## 2015-12-25 NOTE — ED Provider Notes (Signed)
CSN: [id number]     Arrival date & time 12/25/15  0809 History   First MD Initiated Contact with Patient 12/25/15 [phone]     Chief Complaint  Patient presents with  . Shortness of Breath  . Chest Pain     (Consider location/radiation/quality/duration/timing/severity/associated sxs/prior Treatment) HPI  Expand All Collapse All   Pt reports she has been having problem with SOB and chest heaviness for a while now. Became severe x 4 days ago.Patient has had green productive sputum.  She does continue to smoke.  She states the discomfort is worse when she coughs a lot.  She is currently on Macrobid States her doctors kept saying it's bronchitis but is not getting any better. Pt reports chest pain is non-radiating. Pt also reports nausea. Pt also reports recurrent UTI and is on abx.         Past Medical History  Diagnosis Date  . CAD (coronary artery disease)     a. 1997 MI/PCI RCA;  b. 2007 MI/PCI of 100% RCA with Taxus DES x 3 placed, EF 55%;  c. 2010 Cath: stable anatomy;  d. 05/2012 NSTEMI/Cath/PCI: LM nl, LAD 60-24m, D1 20ost, LCX 10m, RCA 40-56m ISR, 60/95d (Treated w/ 2.75x33 Xience Xpedition DES), PDA 28m (Treated w/ PTCA), EF 60%.  . Depression   . Hyperlipidemia   . Carpal tunnel syndrome on both sides   . Numbness of foot left foot    "never recovered from last back OR, 2009" (05/17/2012)  . GERD (gastroesophageal reflux disease)   . Arthritis     "back; probably knees" (05/17/2012)  . Chronic lower back pain   . Anginal pain ([place])   . Myocardial infarction [facility])  1997, 2007 , last 2013    x 3  . Blood transfusion without reported diagnosis    Past Surgical History  Procedure Laterality Date  . Cesarean section  1990  . Ankle surgery  years ago    left; "had to take out a floater"  . Posterior fusion lumbar spine  1962; 2009  . Coronary angioplasty with stent placement  1997; 2007, 2013    "2 + 2; + 1= total of 5  . Total hip arthroplasty Right 05/24/2014   Procedure: RIGHT TOTAL HIP ARTHROPLASTY ANTERIOR APPROACH;  Surgeon: Gearlean Alf, MD;  Location: [facility];  Service: Orthopedics;  Laterality: Right;  . Left heart catheterization with coronary angiogram N/A 05/18/2012    Procedure: LEFT HEART CATHETERIZATION WITH CORONARY ANGIOGRAM;  Surgeon: Wellington Hampshire, MD;  Location: [place] CATH LAB;  Service: Cardiovascular;  Laterality: N/A;  . Carpal tunnel release Left 10/12016   Family History  Problem Relation Age of Onset  . Coronary artery disease Mother   . Diabetes Mother   . Hypertension Mother   . Dementia Father   . Sudden death Brother     suicide  . Cancer Paternal Grandfather     esophageal   Social History  Substance Use Topics  . Smoking status: Current Every Day Smoker -- 0.50 packs/day for 50 years    Types: Cigarettes  . Smokeless tobacco: Never Used     Comment: Working on it at this time.//kg  . Alcohol Use: No   OB History    Gravida Para Term Preterm AB TAB SAB Ectopic Multiple Living   2 2        2      Review of Systems All other systems reviewed and are negative   Allergies  Oxycodone hcl; Penicillins;  Aspirin; Macrobid; Sulfa antibiotics; and Tramadol  Home Medications   Prior to Admission medications   Medication Sig Start Date End Date Taking? Authorizing Provider  clopidogrel (PLAVIX) 75 MG tablet TAKE 1 TABLET BY MOUTH ONCE DAILY 11/15/15  Yes Fay Records, MD  escitalopram (LEXAPRO) 20 MG tablet TAKE 1 TABLET (20 MG TOTAL) BY MOUTH DAILY. 11/21/15  Yes [name], NP  HYDROmorphone (DILAUDID) 2 MG tablet Take 2 mg by mouth every 8 (eight) hours as needed. for pain 10/22/15  Yes Historical Provider, MD  metoprolol (LOPRESSOR) 50 MG tablet TAKE 1/2 TABLETS (25 MG TOTAL) BY MOUTH 2 (TWO) TIMES DAILY. 06/11/15  Yes [name], NP  Probiotic Product (PROBIOTIC PO) Take 1 capsule by mouth daily.    Yes Historical Provider, MD  Red Yeast Rice Extract (RED YEAST RICE PO) Take 1 capsule by mouth daily.     Yes Historical Provider, MD  simvastatin (ZOCOR) 80 MG tablet Take 1 tablet (80 mg total) by mouth at bedtime. 02/14/15  Yes [name], NP  UNABLE TO FIND Take 1 capsule by mouth daily. OTC Eye vitamin   Yes Historical Provider, MD  UNABLE TO FIND Inject 1 mL into the skin every 14 (fourteen) days. b12 shots twice a month at MDs office   Yes Historical Provider, MD  zolpidem (AMBIEN) 5 MG tablet Take 1 tablet (5 mg total) by mouth at bedtime. 12/20/15  Yes [name], NP  doxycycline (VIBRAMYCIN) 100 MG capsule Take 1 capsule (100 mg total) by mouth 2 (two) times daily. 12/25/15   Leonard Schwartz, MD  mirabegron ER (MYRBETRIQ) 50 MG TB24 tablet Take 1 tablet (50 mg total) by mouth daily. Patient not taking: Reported on 12/11/2015 11/28/15   [name], NP  nitrofurantoin, macrocrystal-monohydrate, (MACROBID) 100 MG capsule Take 100 mg by mouth 2 (two) times daily. 12/20/15-12/27/15 12/20/15   Historical Provider, MD  phenazopyridine (PYRIDIUM) 100 MG tablet Take 100 mg by mouth every 8 (eight) hours as needed. For pain 12/20/15   Historical Provider, MD  terconazole (TERAZOL 7) 0.4 % vaginal cream Place 1 applicator vaginally at bedtime. One applicator full QHS for seven days of therapy Patient not taking: Reported on 12/25/2015 12/11/15   Regina Eck, CNM   BP 126/66 mmHg  Pulse 50  Temp(Src) 98.2 F (36.8 C) (Oral)  Resp 16  Ht 5\' 1"  (1.549 m)  Wt 163 lb (73.936 kg)  BMI 30.81 kg/m2  SpO2 97%  LMP  (LMP Unknown) Physical Exam Physical Exam  Nursing note and vitals reviewed. Constitutional: She is oriented to person, place, and time. She appears well-developed and well-nourished. No distress.  HENT:  Head: Normocephalic and atraumatic.  Eyes: Pupils are equal, round, and reactive to light.  Neck: Normal range of motion.  Cardiovascular: Normal rate and intact distal pulses.   Pulmonary/Chest: No respiratory distress.  scattered expiratory wheezes in all lung fields. Abdominal: Normal  appearance. She exhibits no distension.  Musculoskeletal: Normal range of motion.  Neurological: She is alert and oriented to person, place, and time. No cranial nerve deficit.  Skin: Skin is warm and dry. No rash noted.  Psychiatric: She has a normal mood and affect. Her behavior is normal.   ED Course  Procedures (including critical care time) [place], ED    Imaging Review Dg Chest 2 View  12/24/2015  CLINICAL DATA:  On a antibiotics for urinary tract infection. EXAM: CHEST  2 VIEW COMPARISON:  08/16/2014 FINDINGS: The heart size and mediastinal contours are within normal limits. Both lungs are clear. The visualized skeletal structures are unremarkable. IMPRESSION: No active cardiopulmonary disease. Electronically Signed   By: Kerby Moors M.D.   On: 12/24/2015 19:33   I have personally reviewed and evaluated these images and lab results as part of my medical decision-making.   EKG Interpretation   Date/Time:  Tuesday Dec 25 2015 08:18:14 EDT Ventricular Rate:  57 PR Interval:  162 QRS Duration: 102 QT Interval:  411 QTC Calculation: 400 R Axis:   66 Text Interpretation:  Sinus rhythm Probable left ventricular hypertrophy  Borderline T abnormalities, anterior leads No significant change since  last tracing Confirmed by Evea Sheek  MD, Tyjai Matuszak ([id number]) on 12/25/2015 8:50:39  AM      MDM   Final diagnoses:  Bronchitis  Tobacco use        Leonard Schwartz, MD 12/25/15 1032

## 2016-01-02 ENCOUNTER — Encounter: Payer: Self-pay | Admitting: Adult Health

## 2016-01-02 ENCOUNTER — Ambulatory Visit (INDEPENDENT_AMBULATORY_CARE_PROVIDER_SITE_OTHER): Payer: Managed Care, Other (non HMO) | Admitting: Adult Health

## 2016-01-02 VITALS — BP 136/64 | Temp 97.9°F | Ht 61.0 in | Wt 168.1 lb

## 2016-01-02 DIAGNOSIS — J069 Acute upper respiratory infection, unspecified: Secondary | ICD-10-CM

## 2016-01-02 DIAGNOSIS — F329 Major depressive disorder, single episode, unspecified: Secondary | ICD-10-CM

## 2016-01-02 DIAGNOSIS — F32A Depression, unspecified: Secondary | ICD-10-CM

## 2016-01-02 MED ORDER — FLUOXETINE HCL 20 MG PO TABS: 20.0000 mg | ORAL_TABLET | Freq: Every day | ORAL | Status: DC

## 2016-01-02 NOTE — Progress Notes (Addendum)
   Subjective:    Patient ID: Bianca [name], female    DOB: 04/07/1946, 70 y.o.   MRN: [id number]  HPI  70 year old female who presents to the office today after being seen in the ER for SOB and Chest pain. She had a chest x ray which was negative and showed no active cardio pulmonary disease. Labs were unremarkable as well.   She was prescribed doxycycline for bronchitis. She continues to complain of chest congestion, nasal congestion in the morning and at night, semi productive cough, and the general feeling of " feeling sick all the time."   She endorses subjective fevers at home.   Has been using Mucinex.   This has been an ongoing issue for 2 years.   She continues to smoke but is down to 3 cigarettes per day.   Additionally, she feels as though Lexapro is not working well for her. She would like to go back on Prozac. She denies SI or HI.   Review of Systems  Constitutional: Positive for fever, chills, activity change and fatigue.  HENT: Positive for congestion, postnasal drip and rhinorrhea. Negative for ear discharge, ear pain, sinus pressure and sore throat.   Eyes: Negative.   Respiratory: Positive for cough. Negative for shortness of breath and wheezing.   Cardiovascular: Negative.   Neurological: Negative.   Psychiatric/Behavioral:       Depression  All other systems reviewed and are negative.      Objective:   Physical Exam  Constitutional: She appears well-developed and well-nourished. No distress.  HENT:  Head: Normocephalic and atraumatic.  Right Ear: Hearing, tympanic membrane, external ear and ear canal normal.  Left Ear: Hearing, tympanic membrane, external ear and ear canal normal.  Nose: Mucosal edema (Right side) present. No rhinorrhea. Right sinus exhibits no maxillary sinus tenderness and no frontal sinus tenderness. Left sinus exhibits no maxillary sinus tenderness and no frontal sinus tenderness.  Mouth/Throat: Oropharynx is clear and moist. No  oropharyngeal exudate.  Eyes: Conjunctivae and EOM are normal. Pupils are equal, round, and reactive to light. Right eye exhibits no discharge. Left eye exhibits no discharge.  Neck: Neck supple.  Cardiovascular: Normal rate, regular rhythm, normal heart sounds and intact distal pulses.  Exam reveals no gallop and no friction rub.   No murmur heard. Pulmonary/Chest: Effort normal and breath sounds normal. No respiratory distress. She has no wheezes. She has no rales. She exhibits no tenderness.  Lymphadenopathy:    She has no cervical adenopathy.  Neurological: She is alert.  Skin: Skin is warm and dry. No rash noted. She is not diaphoretic. No erythema. No pallor.  Psychiatric: She has a normal mood and affect. Her behavior is normal. Judgment and thought content normal.  Nursing note and vitals reviewed.      Assessment & Plan:  1. Multiple URI - She has been worked up for this on multiple occassions and has been on multiple antibiotics without resolution.  - She needs to quit smoking.  - Use Flonase and Claritin - Ambulatory referral to ENT - Consider referral to Allergy  - Follow up as needed.  2. Depression - d/c lexapro  - FLUoxetine (PROZAC) 20 MG tablet; Take 1 tablet (20 mg total) by mouth daily.  Dispense: 30 tablet; Refill: 3 - Follow up in one month

## 2016-01-02 NOTE — Patient Instructions (Addendum)
Someone will call you to schedule your appointment with ENT>   Start your prozac today   Stop Lexapro  Use Flonase twice a day and add Claritin

## 2016-01-09 ENCOUNTER — Other Ambulatory Visit: Payer: Self-pay | Admitting: Otolaryngology

## 2016-01-09 DIAGNOSIS — J3489 Other specified disorders of nose and nasal sinuses: Secondary | ICD-10-CM

## 2016-01-09 DIAGNOSIS — J310 Chronic rhinitis: Secondary | ICD-10-CM

## 2016-01-09 DIAGNOSIS — R0981 Nasal congestion: Secondary | ICD-10-CM

## 2016-01-16 ENCOUNTER — Telehealth: Payer: Self-pay | Admitting: Adult Health

## 2016-01-16 NOTE — Telephone Encounter (Signed)
Pt would like to speak with [facility].

## 2016-01-16 NOTE — Telephone Encounter (Signed)
See below

## 2016-01-17 ENCOUNTER — Telehealth: Payer: Self-pay | Admitting: Adult Health

## 2016-01-17 ENCOUNTER — Other Ambulatory Visit: Payer: Self-pay

## 2016-01-17 DIAGNOSIS — G4700 Insomnia, unspecified: Secondary | ICD-10-CM

## 2016-01-17 MED ORDER — ZOLPIDEM TARTRATE 5 MG PO TABS: 5.0000 mg | ORAL_TABLET | Freq: Every day | ORAL | Status: DC

## 2016-01-17 NOTE — Telephone Encounter (Signed)
Ok to refill Bianca [name] for one month

## 2016-01-17 NOTE — Telephone Encounter (Signed)
Rx faxed in.

## 2016-01-17 NOTE — Telephone Encounter (Signed)
FYI; Pt call to say her results were Variety of  mole and fungus   Pt also req refill on zolpidem (AMBIEN) 5 MG tablet   [facility] [address]

## 2016-01-17 NOTE — Telephone Encounter (Signed)
Will route to PCP [facility] to refill?

## 2016-01-22 ENCOUNTER — Other Ambulatory Visit: Payer: Self-pay | Admitting: Adult Health

## 2016-01-22 ENCOUNTER — Encounter: Payer: Self-pay | Admitting: Adult Health

## 2016-01-22 ENCOUNTER — Ambulatory Visit (INDEPENDENT_AMBULATORY_CARE_PROVIDER_SITE_OTHER): Payer: Managed Care, Other (non HMO) | Admitting: Adult Health

## 2016-01-22 VITALS — BP 134/60 | Temp 98.1°F | Ht 61.0 in | Wt 165.5 lb

## 2016-01-22 DIAGNOSIS — N3001 Acute cystitis with hematuria: Secondary | ICD-10-CM

## 2016-01-22 LAB — POCT URINALYSIS DIPSTICK
Bilirubin, UA: NEGATIVE
Glucose, UA: NEGATIVE
Ketones, UA: NEGATIVE
SPEC GRAV UA: 1.025
Urobilinogen, UA: 0.2
pH, UA: 6

## 2016-01-22 MED ORDER — CIPROFLOXACIN HCL 500 MG PO TABS: 500.0000 mg | ORAL_TABLET | Freq: Two times a day (BID) | ORAL | Status: DC

## 2016-01-22 NOTE — Patient Instructions (Signed)

## 2016-01-22 NOTE — Progress Notes (Signed)
[id number] Lacorey Brusca, NP No chief complaint on file.   Current Issues:  Presents with 3 days of dysuria, urinary urgency and urinary frequency Associated symptoms include:  cloudy urine, dysuria, flank pain bilaterally, lower abdominal pain, urinary frequency, urinary hesitancy, urinary retention and urinary urgency  There is a previous history of of similar symptoms. Sexually active:  No   No concern for STI.  Prior to Admission medications   Medication Sig Start Date End Date Taking? Authorizing Provider  clopidogrel (PLAVIX) 75 MG tablet TAKE 1 TABLET BY MOUTH ONCE DAILY 11/15/15  Yes Fay Records, MD  doxycycline (VIBRAMYCIN) 100 MG capsule Take 1 capsule (100 mg total) by mouth 2 (two) times daily. 12/25/15  Yes Leonard Schwartz, MD  FLUoxetine (PROZAC) 20 MG tablet Take 1 tablet (20 mg total) by mouth daily. 01/02/16  Yes [name], NP  HYDROmorphone (DILAUDID) 2 MG tablet Take 2 mg by mouth every 8 (eight) hours as needed. for pain 10/22/15  Yes Historical Provider, MD  metoprolol (LOPRESSOR) 50 MG tablet TAKE 1/2 TABLETS (25 MG TOTAL) BY MOUTH 2 (TWO) TIMES DAILY. 06/11/15  Yes [name], NP  phenazopyridine (PYRIDIUM) 100 MG tablet Take 100 mg by mouth every 8 (eight) hours as needed. For pain 12/20/15  Yes Historical Provider, MD  Probiotic Product (PROBIOTIC PO) Take 1 capsule by mouth daily.    Yes Historical Provider, MD  Red Yeast Rice Extract (RED YEAST RICE PO) Take 1 capsule by mouth daily.    Yes Historical Provider, MD  simvastatin (ZOCOR) 80 MG tablet TAKE 1 TABLET BY MOUTH AT BEDTIME 01/22/16  Yes [name], NP  terconazole (TERAZOL 7) 0.4 % vaginal cream Place 1 applicator vaginally at bedtime. One applicator full QHS for seven days of therapy 12/11/15  Yes Regina Eck, CNM  UNABLE TO FIND Take 1 capsule by mouth daily. OTC Eye vitamin   Yes Historical Provider, MD  UNABLE TO FIND Inject 1 mL into the skin every 14 (fourteen) days. b12 shots twice a month at MDs  office   Yes Historical Provider, MD  zolpidem (AMBIEN) 5 MG tablet Take 1 tablet (5 mg total) by mouth at bedtime. 01/17/16  Yes [name], NP  ciprofloxacin (CIPRO) 500 MG tablet Take 1 tablet (500 mg total) by mouth 2 (two) times daily. 01/22/16   [name], NP    Review of Systems:Negative unless mentioned above  PE:  BP 134/60 mmHg  Temp(Src) 98.1 F (36.7 C) (Oral)  Ht 5\' 1"  (1.549 m)  Wt 165 lb 8 oz (75.07 kg)  BMI 31.29 kg/m2  LMP  (LMP Unknown) Constitutional: Alert and oriented. In no acute distress Heart: Normal rate and normal rhythm. No m/r/g Lungs: Clear to auscultation  Back: + bilateral CVA tenderness Abdomen: no tenderness, no distention, no masses. Normal bowel sounds  Results for orders placed or performed during the hospital encounter of [id number]  Basic metabolic panel  Result Value Ref Range   Sodium 138 135 - 145 mmol/L   Potassium 4.0 3.5 - 5.1 mmol/L   Chloride 104 101 - 111 mmol/L   CO2 23 22 - 32 mmol/L   Glucose, Bld 85 65 - 99 mg/dL   BUN 6 6 - 20 mg/dL   Creatinine, Ser 0.70 0.44 - 1.00 mg/dL   Calcium 9.5 8.9 - 10.3 mg/dL   GFR calc non Af Amer >60 >60 mL/min   GFR calc Af Amer >60 >60 mL/min   Anion gap 11 5 - 15  CBC  Result Value Ref Range   WBC 9.3 4.0 - 10.5 K/uL   RBC 4.13 3.87 - 5.11 MIL/uL   Hemoglobin 13.2 12.0 - 15.0 g/dL   HCT 37.2 36.0 - 46.0 %   MCV 90.1 78.0 - 100.0 fL   MCH 32.0 26.0 - 34.0 pg   MCHC 35.5 30.0 - 36.0 g/dL   RDW 12.3 11.5 - 15.5 %   Platelets 255 150 - 400 K/uL  I-stat troponin, ED  Result Value Ref Range   Troponin i, poc 0.01 0.00 - 0.08 ng/mL   Comment 3            Assessment and Plan:  1. Acute cystitis with hematuria [N30.01] - POCT urinalysis dipstick - Urine culture - ciprofloxacin (CIPRO) 500 MG tablet; Take 1 tablet (500 mg total) by mouth 2 (two) times daily.  Dispense: 14 tablet; Refill: 0 - Follow up as needed  [name], NP

## 2016-01-22 NOTE — Progress Notes (Deleted)
Subjective:    Patient ID: Bianca [name], female    DOB: Jan 23, 1946, 70 y.o.   MRN: [id number]  HPI    Review of Systems     Objective:   Physical Exam        Assessment & Plan:   [id number] Steffany Schoenfelder, NP No chief complaint on file.   Current Issues:  Presents with 3 days of dysuria, urinary urgency and urinary frequency Associated symptoms include:  cloudy urine, dysuria, flank pain bilaterally, lower abdominal pain, urinary frequency, urinary retention and urinary urgency  There is a previous history of of similar symptoms. Sexually active:  No   No concern for STI.  Prior to Admission medications   Medication Sig Start Date End Date Taking? Authorizing Provider  clopidogrel (PLAVIX) 75 MG tablet TAKE 1 TABLET BY MOUTH ONCE DAILY 11/15/15  Yes Fay Records, MD  doxycycline (VIBRAMYCIN) 100 MG capsule Take 1 capsule (100 mg total) by mouth 2 (two) times daily. 12/25/15  Yes Leonard Schwartz, MD  FLUoxetine (PROZAC) 20 MG tablet Take 1 tablet (20 mg total) by mouth daily. 01/02/16  Yes [name], NP  HYDROmorphone (DILAUDID) 2 MG tablet Take 2 mg by mouth every 8 (eight) hours as needed. for pain 10/22/15  Yes Historical Provider, MD  metoprolol (LOPRESSOR) 50 MG tablet TAKE 1/2 TABLETS (25 MG TOTAL) BY MOUTH 2 (TWO) TIMES DAILY. 06/11/15  Yes [name], NP  phenazopyridine (PYRIDIUM) 100 MG tablet Take 100 mg by mouth every 8 (eight) hours as needed. For pain 12/20/15  Yes Historical Provider, MD  Probiotic Product (PROBIOTIC PO) Take 1 capsule by mouth daily.    Yes Historical Provider, MD  Red Yeast Rice Extract (RED YEAST RICE PO) Take 1 capsule by mouth daily.    Yes Historical Provider, MD  simvastatin (ZOCOR) 80 MG tablet TAKE 1 TABLET BY MOUTH AT BEDTIME 01/22/16  Yes [name], NP  terconazole (TERAZOL 7) 0.4 % vaginal cream Place 1 applicator vaginally at bedtime. One applicator full QHS for seven days of therapy 12/11/15  Yes Regina Eck, CNM  UNABLE TO FIND  Take 1 capsule by mouth daily. OTC Eye vitamin   Yes Historical Provider, MD  UNABLE TO FIND Inject 1 mL into the skin every 14 (fourteen) days. b12 shots twice a month at MDs office   Yes Historical Provider, MD  zolpidem (AMBIEN) 5 MG tablet Take 1 tablet (5 mg total) by mouth at bedtime. 01/17/16  Yes [name], NP    Review of Systems Negative unless mentioned above   PE:  BP 134/60 mmHg  Temp(Src) 98.1 F (36.7 C) (Oral)  Ht 5\' 1"  (1.549 m)  Wt 165 lb 8 oz (75.07 kg)  BMI 31.29 kg/m2  LMP  (LMP Unknown) Constitutional: Alert and orei Heart: Normal rate and normal rhythm  Lungs: Clear to auscultation  Back: +  CVA tenderness Abdomen: no tenderness, no distension, no rebound tenderness  Results for orders placed or performed during the hospital encounter of [id number]  Basic metabolic panel  Result Value Ref Range   Sodium 138 135 - 145 mmol/L   Potassium 4.0 3.5 - 5.1 mmol/L   Chloride 104 101 - 111 mmol/L   CO2 23 22 - 32 mmol/L   Glucose, Bld 85 65 - 99 mg/dL   BUN 6 6 - 20 mg/dL   Creatinine, Ser 0.70 0.44 - 1.00 mg/dL   Calcium 9.5 8.9 - 10.3 mg/dL   GFR calc non Af Amer >  60 >60 mL/min   GFR calc Af Amer >60 >60 mL/min   Anion gap 11 5 - 15  CBC  Result Value Ref Range   WBC 9.3 4.0 - 10.5 K/uL   RBC 4.13 3.87 - 5.11 MIL/uL   Hemoglobin 13.2 12.0 - 15.0 g/dL   HCT 37.2 36.0 - 46.0 %   MCV 90.1 78.0 - 100.0 fL   MCH 32.0 26.0 - 34.0 pg   MCHC 35.5 30.0 - 36.0 g/dL   RDW 12.3 11.5 - 15.5 %   Platelets 255 150 - 400 K/uL  I-stat troponin, ED  Result Value Ref Range   Troponin i, poc 0.01 0.00 - 0.08 ng/mL   Comment 3            Assessment and Plan:  There are no diagnoses linked to this encounter.

## 2016-01-24 ENCOUNTER — Telehealth: Payer: Self-pay | Admitting: Adult Health

## 2016-01-24 LAB — URINE CULTURE: Colony Count: >=100000

## 2016-01-24 NOTE — Telephone Encounter (Signed)
Pt states she would like to proceed and get referral to pulmonary.

## 2016-01-24 NOTE — Telephone Encounter (Signed)
Please advise 

## 2016-01-26 ENCOUNTER — Observation Stay (HOSPITAL_COMMUNITY)
Admission: EM | Admit: 2016-01-26 | Discharge: 2016-01-27 | Disposition: A | Discharge status: Home / Self Care | Payer: Managed Care, Other (non HMO) | Attending: Internal Medicine | Admitting: Internal Medicine

## 2016-01-26 ENCOUNTER — Encounter (HOSPITAL_COMMUNITY): Payer: Self-pay | Admitting: Emergency Medicine

## 2016-01-26 ENCOUNTER — Emergency Department (HOSPITAL_COMMUNITY): Payer: Managed Care, Other (non HMO)

## 2016-01-26 DIAGNOSIS — Z96641 Presence of right artificial hip joint: Secondary | ICD-10-CM | POA: Diagnosis not present

## 2016-01-26 DIAGNOSIS — Z79899 Other long term (current) drug therapy: Secondary | ICD-10-CM | POA: Insufficient documentation

## 2016-01-26 DIAGNOSIS — R079 Chest pain, unspecified: Secondary | ICD-10-CM

## 2016-01-26 DIAGNOSIS — R0789 Other chest pain: Secondary | ICD-10-CM | POA: Diagnosis not present

## 2016-01-26 DIAGNOSIS — F17200 Nicotine dependence, unspecified, uncomplicated: Secondary | ICD-10-CM

## 2016-01-26 DIAGNOSIS — I10 Essential (primary) hypertension: Secondary | ICD-10-CM | POA: Diagnosis not present

## 2016-01-26 DIAGNOSIS — R001 Bradycardia, unspecified: Secondary | ICD-10-CM | POA: Diagnosis not present

## 2016-01-26 DIAGNOSIS — I25110 Atherosclerotic heart disease of native coronary artery with unstable angina pectoris: Secondary | ICD-10-CM | POA: Diagnosis present

## 2016-01-26 DIAGNOSIS — Z79891 Long term (current) use of opiate analgesic: Secondary | ICD-10-CM | POA: Diagnosis not present

## 2016-01-26 DIAGNOSIS — Z955 Presence of coronary angioplasty implant and graft: Secondary | ICD-10-CM | POA: Diagnosis not present

## 2016-01-26 DIAGNOSIS — I2510 Atherosclerotic heart disease of native coronary artery without angina pectoris: Secondary | ICD-10-CM | POA: Diagnosis not present

## 2016-01-26 DIAGNOSIS — F329 Major depressive disorder, single episode, unspecified: Secondary | ICD-10-CM | POA: Insufficient documentation

## 2016-01-26 DIAGNOSIS — Z720 Tobacco use: Secondary | ICD-10-CM

## 2016-01-26 DIAGNOSIS — E785 Hyperlipidemia, unspecified: Secondary | ICD-10-CM | POA: Diagnosis not present

## 2016-01-26 DIAGNOSIS — E871 Hypo-osmolality and hyponatremia: Secondary | ICD-10-CM | POA: Diagnosis not present

## 2016-01-26 DIAGNOSIS — M1990 Unspecified osteoarthritis, unspecified site: Secondary | ICD-10-CM | POA: Insufficient documentation

## 2016-01-26 DIAGNOSIS — Z7902 Long term (current) use of antithrombotics/antiplatelets: Secondary | ICD-10-CM | POA: Insufficient documentation

## 2016-01-26 DIAGNOSIS — F17210 Nicotine dependence, cigarettes, uncomplicated: Secondary | ICD-10-CM | POA: Diagnosis not present

## 2016-01-26 DIAGNOSIS — I252 Old myocardial infarction: Secondary | ICD-10-CM | POA: Insufficient documentation

## 2016-01-26 LAB — URINALYSIS, ROUTINE W REFLEX MICROSCOPIC
Bilirubin Urine: NEGATIVE
GLUCOSE, UA: NEGATIVE mg/dL
Ketones, ur: NEGATIVE mg/dL
Leukocytes, UA: NEGATIVE
Nitrite: NEGATIVE
PH: 6 (ref 5.0–8.0)
Protein, ur: NEGATIVE mg/dL
SPECIFIC GRAVITY, URINE: 1.003 — AB (ref 1.005–1.030)

## 2016-01-26 LAB — URINE MICROSCOPIC-ADD ON
BACTERIA UA: NONE SEEN
WBC, UA: NONE SEEN WBC/hpf (ref 0–5)

## 2016-01-26 LAB — CBC
HCT: 34.8 % — ABNORMAL LOW (ref 36.0–46.0)
HEMOGLOBIN: 12.3 g/dL (ref 12.0–15.0)
MCH: 31.4 pg (ref 26.0–34.0)
MCHC: 35.3 g/dL (ref 30.0–36.0)
MCV: 88.8 fL (ref 78.0–100.0)
PLATELETS: 317 10*3/uL (ref 150–400)
RBC: 3.92 MIL/uL (ref 3.87–5.11)
RDW: 11.8 % (ref 11.5–15.5)
WBC: 8.8 10*3/uL (ref 4.0–10.5)

## 2016-01-26 LAB — BASIC METABOLIC PANEL
ANION GAP: 7 (ref 5–15)
BUN: <5 mg/dL — ABNORMAL LOW (ref 6–20)
CALCIUM: 8.8 mg/dL — AB (ref 8.9–10.3)
CO2: 25 mmol/L (ref 22–32)
CREATININE: 0.67 mg/dL (ref 0.44–1.00)
Chloride: 99 mmol/L — ABNORMAL LOW (ref 101–111)
Glucose, Bld: 111 mg/dL — ABNORMAL HIGH (ref 65–99)
Potassium: 3.5 mmol/L (ref 3.5–5.1)
Sodium: 131 mmol/L — ABNORMAL LOW (ref 135–145)

## 2016-01-26 LAB — I-STAT TROPONIN, ED: Troponin i, poc: 0.01 ng/mL (ref 0.00–0.08)

## 2016-01-26 NOTE — H&P (Addendum)
Bianca [name] [id number] DOB: 1945/11/28 DOA: 01/26/2016     PCP: [name], NP   Outpatient Specialists: Cardiology Dorris Carnes Patient coming from:  home Lives   With family    Chief Complaint: Chest pain  HPI: Bianca [name] is a 70 y.o. female with medical history significant of HTN, CAD say status post MI in 1997, HL, depression depression    Presented with shortness of breath and chest pain associates some nausea started this evening pain is in the left chest radiates to her back. Onset was sudden. Pain has been commenting going lasting less than a minute at a time. Feels like a gas pain.  The pressure was feeling somewhat similar to when she had her heart attack but feels less severe. She found out that she has black mold in her house and she is really scared. Feels like she has not been feeling well for past 1 year.   Recently has been diagnosed with UTI treated with ciprofloxacin  Regarding pertinent Chronic problems: Known history of coronary disease status post MI 1997 treated with PCI to RCA status post Taxus DES 3 the RCA in 2007 in the setting of a mild MI, Echogram in 2010 showed EF 50-60 percent with mild mitral regurgitation. Last time patient was admitted in 2013 with non-ST elevation MI as undergone left heart cath in October 2013 showing mid LAD 60-70 percent D1 20% M CFX 50% proximal to distal RCA multiple overlapping stents, mid 40-50 percent ISR, distal 60-95% she has undergone PCI DES to RCA in both angioplasty to PDA that time was decided if she continues to have restenosis of RCA best next treatment would be CABG. Patient is currently on Plavix  IN ER: Afebrile, heart rate 49, blood pressure 131/61, WBC 8.8, hemoglobin 12.3, sodium 131, creatinine 0.67 glucose 111 troponin 0.01 Chest x-ray unremarkable  Case was discussed by EER provider cardiology who states it's okay to keep patient at [facility] long and continue to cycle cardiac enzymes  Hospitalist  was called for admission for chest pain in the setting of history of coronary artery disease  Review of Systems:    Pertinent positives include:  shortness of breath at rest. Nausea, chest pain,   Constitutional:  No weight loss, night sweats, Fevers, chills, fatigue, weight loss  HEENT:  No headaches, Difficulty swallowing,Tooth/dental problems,Sore throat,  No sneezing, itching, ear ache, nasal congestion, post nasal drip,  Cardio-vascular:  No Orthopnea, PND, anasarca, dizziness, palpitations.no Bilateral lower extremity swelling  GI:  No heartburn, indigestion, abdominal pain,  vomiting, diarrhea, change in bowel habits, loss of appetite, melena, blood in stool, hematemesis Resp:  noNo dyspnea on exertion, No excess mucus, no productive cough, No non-productive cough, No coughing up of blood.No change in color of mucus.No wheezing. Skin:  no rash or lesions. No jaundice GU:  no dysuria, change in color of urine, no urgency or frequency. No straining to urinate.  No flank pain.  Musculoskeletal:  No joint pain or no joint swelling. No decreased range of motion. No back pain.  Psych:  No change in mood or affect. No depression or anxiety. No memory loss.  Neuro: no localizing neurological complaints, no tingling, no weakness, no double vision, no gait abnormality, no slurred speech, no confusion  As per HPI otherwise 10 point review of systems negative.   Past Medical History: Past Medical History  Diagnosis Date  . CAD (coronary artery disease)     a. 1997 MI/PCI RCA;  b. 2007 MI/PCI of 100% RCA with Taxus DES x 3 placed, EF 55%;  c. 2010 Cath: stable anatomy;  d. 05/2012 NSTEMI/Cath/PCI: LM nl, LAD 60-30m, D1 20ost, LCX 61m, RCA 40-67m ISR, 60/95d (Treated w/ 2.75x33 Xience Xpedition DES), PDA 27m (Treated w/ PTCA), EF 60%.  . Depression   . Hyperlipidemia   . Carpal tunnel syndrome on both sides   . Numbness of foot left foot    "never recovered from last back OR, 2009"  (05/17/2012)  . GERD (gastroesophageal reflux disease)   . Arthritis     "back; probably knees" (05/17/2012)  . Chronic lower back pain   . Anginal pain ([place])   . Myocardial infarction [facility])  1997, 2007 , last 2013    x 3  . Blood transfusion without reported diagnosis   . MI (myocardial infarction) [facility])    Past Surgical History  Procedure Laterality Date  . Cesarean section  1990  . Ankle surgery  years ago    left; "had to take out a floater"  . Posterior fusion lumbar spine  1962; 2009  . Coronary angioplasty with stent placement  1997; 2007, 2013    "2 + 2; + 1= total of 5  . 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 Left heart catheterization with coronary angiogram N/A 05/18/2012    Procedure: LEFT HEART CATHETERIZATION WITH CORONARY ANGIOGRAM;  Surgeon: Wellington Hampshire, MD;  Location: [place] [facility] CATH LAB;  Service: Cardiovascular;  Laterality: N/A;  . Carpal tunnel release Left 10/12016     Social History:  Ambulatory   independently      reports that she has been smoking Cigarettes.  She has a 25 pack-year smoking history. She has never used smokeless tobacco. She reports that she does not drink alcohol or use illicit drugs.  Allergies:   Allergies  Allergen Reactions  . Oxycodone Hcl Itching and Other (See Comments)    confusion  . Penicillins Itching and Rash    Has patient had a PCN reaction causing immediate rash, facial/tongue/throat swelling, SOB or lightheadedness with hypotension: yes rash that took a while to go away across the abdomen Has patient had a PCN reaction causing severe rash involving mucus membranes or skin necrosis: no Has patient had a PCN reaction that required hospitalization: no Has patient had a PCN reaction occurring within the last 10 years: No If all of the above answers are "NO", then may  proceed with Cephalosporin use.   . Aspirin     Burning in stomach  . Macrobid [Nitrofurantoin Monohyd Macro] Nausea And Vomiting  . Sulfa Antibiotics Nausea Only  . Tramadol Itching    Seeing things       Family History:    Family History  Problem Relation Age of Onset  . Coronary artery disease Mother   . Diabetes Mother   . Hypertension Mother   . Dementia Father   . Sudden death Brother     suicide  . Cancer Paternal Grandfather     esophageal    Medications: Prior to Admission medications   Medication Sig Start Date End Date Taking? Authorizing Provider  ciprofloxacin (CIPRO) 500 MG tablet Take 1 tablet (500 mg total) by mouth 2 (two) times daily. 01/22/16  Yes [name], NP  clopidogrel (PLAVIX) 75 MG tablet TAKE 1 TABLET BY MOUTH ONCE DAILY  Patient taking differently: TAKE 75 MG  BY MOUTH ONCE DAILY 11/15/15  Yes Fay Records, MD  FLUoxetine (PROZAC) 20 MG tablet Take 1 tablet (20 mg total) by mouth daily. 01/02/16  Yes [name], NP  HYDROmorphone (DILAUDID) 2 MG tablet Take 2 mg by mouth every 8 (eight) hours as needed. for pain 10/22/15  Yes Historical Provider, MD  metoprolol (LOPRESSOR) 50 MG tablet TAKE 1/2 TABLETS (25 MG TOTAL) BY MOUTH 2 (TWO) TIMES DAILY. 06/11/15  Yes [name], NP  Probiotic Product (PROBIOTIC PO) Take 1 capsule by mouth daily.    Yes Historical Provider, MD  Red Yeast Rice Extract (RED YEAST RICE PO) Take 1 capsule by mouth daily.    Yes Historical Provider, MD  simvastatin (ZOCOR) 80 MG tablet TAKE 1 TABLET BY MOUTH AT BEDTIME Patient taking differently: TAKE 80 MG BY MOUTH AT BEDTIME 01/22/16  Yes [name], NP  UNABLE TO FIND Take 1 capsule by mouth daily. OTC Eye vitamin   Yes Historical Provider, MD  UNABLE TO FIND Inject 1 mL into the skin every 14 (fourteen) days. b12 shots twice a month at MDs office   Yes Historical Provider, MD  zolpidem (AMBIEN) 5 MG tablet Take 1 tablet (5 mg total) by mouth at bedtime. 01/17/16  Yes [name], NP  doxycycline (VIBRAMYCIN) 100 MG capsule Take 1 capsule (100 mg total) by mouth 2 (two) times daily. Patient not taking: Reported on 01/26/2016 12/25/15   Leonard Schwartz, MD  terconazole (TERAZOL 7) 0.4 % vaginal cream Place 1 applicator vaginally at bedtime. One applicator full QHS for seven days of therapy Patient not taking: Reported on 01/26/2016 12/11/15   Regina Eck, CNM    Physical Exam: Patient Vitals for the past 24 hrs:  BP Temp Temp src Pulse Resp SpO2 Height Weight  01/26/16 2300 131/67 mmHg - - (!) 49 12 97 % - -  01/26/16 2230 124/74 mmHg - - (!) 48 20 97 % - -  01/26/16 2200 128/65 mmHg - - (!) 50 14 97 % - -  01/26/16 2130 132/75 mmHg 98.6 F (37 C) Oral 63 23 95 % 5' 1.5" (1.562 m) 71.668 kg (158 lb)    1. General:  in No Acute distress 2. Psychological: Alert and  Oriented 3. Head/ENT:   Moist    Mucous Membranes                          Head Non traumatic, neck supple                          Normal  Dentition 4. SKIN:   decreased Skin turgor,  Skin clean Dry and intact no rash 5. Heart: Regular rate and rhythm no   Murmur, Rub or gallop 6. Lungs Clear to auscultation bilaterally, no wheezes or crackles   7. Abdomen: Soft, non-tender, Non distended 8. Lower extremities: no clubbing, cyanosis, or edema 9. Neurologically Grossly intact, moving all 4 extremities equally 10. MSK: Normal range of motion   body mass index is 29.37 kg/(m^2).  Labs on Admission:   Labs on Admission: I have personally reviewed following labs and imaging studies  CBC:  Recent Labs Lab 01/26/16 2045  WBC 8.8  HGB 12.3  HCT 34.8*  MCV 88.8  PLT [id number]   Basic Metabolic Panel:  Recent Labs Lab 01/26/16 2045  NA 131*  K 3.5  CL  99*  CO2 25  GLUCOSE 111*  BUN <5*  CREATININE 0.67  CALCIUM 8.8*   GFR: Estimated Creatinine Clearance: 60 mL/min (by C-G formula based on Cr of 0.67). Liver Function Tests: No results for input(s): AST, ALT, ALKPHOS, BILITOT,  PROT, ALBUMIN in the last 168 hours. No results for input(s): LIPASE, AMYLASE in the last 168 hours. No results for input(s): AMMONIA in the last 168 hours. Coagulation Profile: No results for input(s): INR, PROTIME in the last 168 hours. Cardiac Enzymes: No results for input(s): CKTOTAL, CKMB, CKMBINDEX, TROPONINI in the last 168 hours. BNP (last 3 results) No results for input(s): PROBNP in the last 8760 hours. HbA1C: No results for input(s): HGBA1C in the last 72 hours. CBG: No results for input(s): GLUCAP in the last 168 hours. Lipid Profile: No results for input(s): CHOL, HDL, LDLCALC, TRIG, CHOLHDL, LDLDIRECT in the last 72 hours. Thyroid Function Tests: No results for input(s): TSH, T4TOTAL, FREET4, T3FREE, THYROIDAB in the last 72 hours. Anemia Panel: No results for input(s): VITAMINB12, FOLATE, FERRITIN, TIBC, IRON, RETICCTPCT in the last 72 hours. Urine analysis:    Component Value Date/Time   COLORURINE YELLOW 01/26/2016 2134   APPEARANCEUR CLEAR 01/26/2016 2134   LABSPEC 1.003* 01/26/2016 2134   PHURINE 6.0 01/26/2016 2134   GLUCOSEU NEGATIVE 01/26/2016 2134   GLUCOSEU NEGATIVE 05/26/2012 1521   HGBUR TRACE* 01/26/2016 2134   HGBUR trace-lysed 06/26/2008 0835   BILIRUBINUR NEGATIVE 01/26/2016 2134   BILIRUBINUR neg 01/22/2016 1415   KETONESUR NEGATIVE 01/26/2016 2134   PROTEINUR NEGATIVE 01/26/2016 2134   PROTEINUR 1+ 01/22/2016 1415   UROBILINOGEN 0.2 01/22/2016 1415   UROBILINOGEN 0.2 05/27/2014 1542   NITRITE NEGATIVE 01/26/2016 2134   NITRITE Trace 01/22/2016 1415   LEUKOCYTESUR NEGATIVE 01/26/2016 2134   Sepsis Labs: @LABRCNTIP (procalcitonin:4,lacticidven:4) ) Recent Results (from the past 240 hour(s))  Urine culture     Status: None   Collection Time: 01/22/16  2:02 PM  Result Value Ref Range Status   Culture KLEBSIELLA PNEUMONIAE  Final   Colony Count >=100,000 COLONIES/ML  Final   Organism ID, Bacteria KLEBSIELLA PNEUMONIAE  Final       Susceptibility   Klebsiella pneumoniae -  (no method available)    AMPICILLIN  Resistant     AMOX/CLAVULANIC <=2 Sensitive     AMPICILLIN/SULBACTAM <=2 Sensitive     PIP/TAZO <=4 Sensitive     IMIPENEM <=0.25 Sensitive     CEFAZOLIN <=4 Not Reportable     CEFTRIAXONE <=1 Sensitive     CEFTAZIDIME <=1 Sensitive     CEFEPIME <=1 Sensitive     GENTAMICIN <=1 Sensitive     TOBRAMYCIN <=1 Sensitive     CIPROFLOXACIN <=0.25 Sensitive     LEVOFLOXACIN <=0.12 Sensitive     NITROFURANTOIN <=16 Sensitive     TRIMETH/SULFA* <=20 Sensitive      * NR=NOT REPORTABLE,SEE COMMENTORAL therapy:A cefazolin MIC of <32 predicts susceptibility to the oral agents cefaclor,cefdinir,cefpodoxime,cefprozil,cefuroxime,cephalexin,and loracarbef when used for therapy of uncomplicated UTIs due to E.coli,K.pneumomiae,and P.mirabilis. PARENTERAL therapy: A cefazolinMIC of >8 indicates resistance to parenteralcefazolin. An alternate test method must beperformed to confirm susceptibility to parenteralcefazolin.      UA   no evidence of UTI  Lab Results  Component Value Date   HGBA1C 5.8 02/14/2015    Estimated Creatinine Clearance: 60 mL/min (by C-G formula based on Cr of 0.67).  BNP (last 3 results) No results for input(s): PROBNP in the last 8760 hours.   ECG REPORT  Independently reviewed  Rate:55  Rhythm: Normal sinus rhythm ST&T Change: No acute ischemic changes  [id number]  Filed Weights   01/26/16 2130  Weight: 71.668 kg (158 lb)     Cultures:    Component Value Date/Time   SDES URINE, CLEAN CATCH 05/27/2014 0621   [place] 05/27/2014 0621   CULT  05/27/2014 0010    NO GROWTH 5 DAYS Performed at [place] 05/27/2014 FINAL 05/27/2014 [id number]     Radiological Exams on Admission: Dg Chest 2 View  01/26/2016  CLINICAL DATA:  Chest pain EXAM: CHEST  2 VIEW COMPARISON:  12/24/2015 chest radiograph. FINDINGS: Coronary artery stent overlies the right heart. Stable  cardiomediastinal silhouette with normal heart size. No pneumothorax. No pleural effusion. Lungs appear clear, with no acute consolidative airspace disease and no pulmonary edema. IMPRESSION: No active cardiopulmonary disease. Electronically Signed   By: Ilona Sorrel M.D.   On: 01/26/2016 21:06    Chart has been reviewed    Assessment/Plan  70 y.o. female with medical history significant of HTN, CAD say status post MI in 1997, HL, depression depression here with chest pain current chest pain-free so far unremarkable workup  Present on Admission:  . Chest pain - given risk factors will admit, monitor on telemetry, cycle cardiac enzymes, obtain serial ECG, echo gram. Further risk stratify with lipid panel, hgA1C, obtain TSH. Make sure patient is on Aspirin. Further treatment based on the currently pending results. Notify cardiology patient is here  . Coronary atherosclerosis - continue Plavix  . Hyperlipidemia continue statin Bradycardia - hold metoprolol for tonight and resume at the lower dose if  can tolerate Hyponatremia. Obtain TSH urine electrolytes rehydrate and recheck Other plan as per orders.  DVT prophylaxis:    Lovenox     Code Status:  FULL CODE  as per patient    Family Communication:   Family  at  Bedside  plan of care was discussed with  Daughter  Theadora Rama [phone]  Disposition Plan:     To home once workup is complete and patient is stable   Consults called: ER provider discussed with Cardiology  Admission status:  obs    Level of care     tele         [place] 01/27/2016, 12:03 AM    [facility]  Pager [phone]   after 2 AM please page floor coverage PA If 7AM-7PM, please contact the day team taking care of the patient  [email or web address]  Password [email or web address]

## 2016-01-26 NOTE — ED Notes (Addendum)
Pt from home with complaints of chest pain, sob, and nausea that began this evening, pt has had 3 MIs pt states she has a hx of high cholesterol. Pt states the pain sits on her left chest and radiates to her back. Pain was sudden.  Pt states she has been exposed to mold in her home x 1 year Pt states she currently has a UTI and has been taking cipro

## 2016-01-26 NOTE — ED Provider Notes (Signed)
CSN: [id number]     Arrival date & time 01/26/16  2017 History   First MD Initiated Contact with Patient 01/26/16 2117     Chief Complaint  Patient presents with  . Chest Pain     (Consider location/radiation/quality/duration/timing/severity/associated sxs/prior Treatment) Patient is a 70 y.o. female presenting with chest pain. The history is provided by the patient (Patient states that today she's been having periodic left-sided chest pain lasting for less than a minute. This pressure pain is similar to when she had her MI).  Chest Pain Pain location:  L chest Pain quality: aching   Pain radiates to:  Does not radiate Pain radiates to the back: no   Pain severity:  Moderate Onset quality:  Sudden Timing:  Intermittent Associated symptoms: no abdominal pain, no back pain, no cough, no fatigue and no headache     Past Medical History  Diagnosis Date  . CAD (coronary artery disease)     a. 1997 MI/PCI RCA;  b. 2007 MI/PCI of 100% RCA with Taxus DES x 3 placed, EF 55%;  c. 2010 Cath: stable anatomy;  d. 05/2012 NSTEMI/Cath/PCI: LM nl, LAD 60-52m, D1 20ost, LCX 22m, RCA 40-31m ISR, 60/95d (Treated w/ 2.75x33 Xience Xpedition DES), PDA 25m (Treated w/ PTCA), EF 60%.  . Depression   . Hyperlipidemia   . Carpal tunnel syndrome on both sides   . Numbness of foot left foot    "never recovered from last back OR, 2009" (05/17/2012)  . GERD (gastroesophageal reflux disease)   . Arthritis     "back; probably knees" (05/17/2012)  . Chronic lower back pain   . Anginal pain ([facility])   . Myocardial infarction [facility])  1997, 2007 , last 2013    x 3  . Blood transfusion without reported diagnosis   . MI (myocardial infarction) [facility])    Past Surgical History  Procedure Laterality Date  . Cesarean section  1990  . Ankle surgery  years ago    left; "had to take out a floater"  . Posterior fusion lumbar spine  1962; 2009  . Coronary angioplasty with stent placement  1997; 2007, 2013    "2 + 2; + 1=  total of 5  . 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 Left heart catheterization with coronary angiogram N/A 05/18/2012    Procedure: LEFT HEART CATHETERIZATION WITH CORONARY ANGIOGRAM;  Surgeon: Wellington Hampshire, MD;  Location: [employer] CATH LAB;  Service: Cardiovascular;  Laterality: N/A;  . Carpal tunnel release Left 10/12016   Family History  Problem Relation Age of Onset  . Coronary artery disease Mother   . Diabetes Mother   . Hypertension Mother   . Dementia Father   . Sudden death Brother     suicide  . Cancer Paternal Grandfather     esophageal   Social History  Substance Use Topics  . Smoking status: Current Every Day Smoker -- 0.50 packs/day for 50 years    Types: Cigarettes  . Smokeless tobacco: Never Used     Comment: Working on it at this time.//kg  . Alcohol Use: No   OB History    Gravida Para Term Preterm AB TAB SAB Ectopic Multiple Living   2 2        2      Review of Systems  Constitutional: Negative for appetite change  and fatigue.  HENT: Negative for congestion, ear discharge and sinus pressure.   Eyes: Negative for discharge.  Respiratory: Negative for cough.   Cardiovascular: Positive for chest pain.  Gastrointestinal: Negative for abdominal pain and diarrhea.  Genitourinary: Negative for frequency and hematuria.  Musculoskeletal: Negative for back pain.  Skin: Negative for rash.  Neurological: Negative for seizures and headaches.  Psychiatric/Behavioral: Negative for hallucinations.      Allergies  Oxycodone hcl; Penicillins; Aspirin; Macrobid; Sulfa antibiotics; and Tramadol  Home Medications   Prior to Admission medications   Medication Sig Start Date End Date Taking? Authorizing Provider  ciprofloxacin (CIPRO) 500 MG tablet Take 1 tablet (500 mg total) by mouth 2 (two) times daily. 01/22/16   Yes [name], NP  clopidogrel (PLAVIX) 75 MG tablet TAKE 1 TABLET BY MOUTH ONCE DAILY Patient taking differently: TAKE 75 MG  BY MOUTH ONCE DAILY 11/15/15  Yes Fay Records, MD  FLUoxetine (PROZAC) 20 MG tablet Take 1 tablet (20 mg total) by mouth daily. 01/02/16  Yes [name], NP  HYDROmorphone (DILAUDID) 2 MG tablet Take 2 mg by mouth every 8 (eight) hours as needed. for pain 10/22/15  Yes Historical Provider, MD  metoprolol (LOPRESSOR) 50 MG tablet TAKE 1/2 TABLETS (25 MG TOTAL) BY MOUTH 2 (TWO) TIMES DAILY. 06/11/15  Yes [name], NP  Probiotic Product (PROBIOTIC PO) Take 1 capsule by mouth daily.    Yes Historical Provider, MD  Red Yeast Rice Extract (RED YEAST RICE PO) Take 1 capsule by mouth daily.    Yes Historical Provider, MD  simvastatin (ZOCOR) 80 MG tablet TAKE 1 TABLET BY MOUTH AT BEDTIME Patient taking differently: TAKE 80 MG BY MOUTH AT BEDTIME 01/22/16  Yes [name], NP  UNABLE TO FIND Take 1 capsule by mouth daily. OTC Eye vitamin   Yes Historical Provider, MD  UNABLE TO FIND Inject 1 mL into the skin every 14 (fourteen) days. b12 shots twice a month at MDs office   Yes Historical Provider, MD  zolpidem (AMBIEN) 5 MG tablet Take 1 tablet (5 mg total) by mouth at bedtime. 01/17/16  Yes [name], NP  doxycycline (VIBRAMYCIN) 100 MG capsule Take 1 capsule (100 mg total) by mouth 2 (two) times daily. Patient not taking: Reported on 01/26/2016 12/25/15   Leonard Schwartz, MD  terconazole (TERAZOL 7) 0.4 % vaginal cream Place 1 applicator vaginally at bedtime. One applicator full QHS for seven days of therapy Patient not taking: Reported on 01/26/2016 12/11/15   Regina Eck, CNM   BP 124/74 mmHg  Pulse 48  Temp(Src) 98.6 F (37 C) (Oral)  Resp 20  Ht 5' 1.5" (1.562 m)  Wt 158 lb (71.668 kg)  BMI 29.37 kg/m2  SpO2 97%  LMP  (LMP Unknown) Physical Exam  Constitutional: She is oriented to person, place, and time. She appears well-developed.  HENT:  Head:  Normocephalic.  Eyes: Conjunctivae and EOM are normal. No scleral icterus.  Neck: Neck supple. No thyromegaly present.  Cardiovascular: Normal rate and regular rhythm.  Exam reveals no gallop and no friction rub.   No murmur heard. Pulmonary/Chest: No stridor. She has no wheezes. She has no rales. She exhibits no tenderness.  Abdominal: She exhibits no distension. There is no tenderness. There is no rebound.  Musculoskeletal: Normal range of motion. She exhibits no edema.  Lymphadenopathy:    She has no cervical adenopathy.  Neurological: She is oriented to person, place, and time. She exhibits normal muscle tone. Coordination  normal.  Skin: No rash noted. No erythema.  Psychiatric: She has a normal mood and affect. Her behavior is normal.    ED Course  Procedures (including critical care time) Labs Review Labs Reviewed  BASIC METABOLIC PANEL - Abnormal; Notable for the following:    Sodium 131 (*)    Chloride 99 (*)    Glucose, Bld 111 (*)    BUN <5 (*)    Calcium 8.8 (*)    All other components within normal limits  CBC - Abnormal; Notable for the following:    HCT 34.8 (*)    All other components within normal limits  URINALYSIS, ROUTINE W REFLEX MICROSCOPIC (NOT AT A [facility]) - Abnormal; Notable for the following:    Specific Gravity, Urine 1.003 (*)    Hgb urine dipstick TRACE (*)    All other components within normal limits  URINE MICROSCOPIC-ADD ON - Abnormal; Notable for the following:    Squamous Epithelial / LPF 0-5 (*)    All other components within normal limits  I-STAT TROPOININ, ED    Imaging Review Dg Chest 2 View  01/26/2016  CLINICAL DATA:  Chest pain EXAM: CHEST  2 VIEW COMPARISON:  12/24/2015 chest radiograph. FINDINGS: Coronary artery stent overlies the right heart. Stable cardiomediastinal silhouette with normal heart size. No pneumothorax. No pleural effusion. Lungs appear clear, with no acute consolidative airspace disease and no pulmonary edema. IMPRESSION:  No active cardiopulmonary disease. Electronically Signed   By: Ilona Sorrel M.D.   On: 01/26/2016 21:06   I have personally reviewed and evaluated these images and lab results as part of my medical decision-making.   EKG Interpretation   Date/Time:  Saturday January 26 2016 20:23:01 EDT Ventricular Rate:  55 PR Interval:  180 QRS Duration: 94 QT Interval:  431 QTC Calculation: 412 R Axis:   75 Text Interpretation:  Sinus rhythm Baseline wander in lead(s) V4 Confirmed  by HAVILAND MD, JULIE ([id number]) on 01/26/2016 8:29:43 PM      MDM   Final diagnoses:  None    Patient with intermittent chest pain and history of coronary artery disease. Labs and EKG unremarkable. I spoke with cardiology who agreed with admission to [facility] and cardiology consult in the a.m.    Milton Ferguson, MD 01/26/16 2239

## 2016-01-27 ENCOUNTER — Observation Stay (HOSPITAL_BASED_OUTPATIENT_CLINIC_OR_DEPARTMENT_OTHER): Payer: Managed Care, Other (non HMO)

## 2016-01-27 ENCOUNTER — Other Ambulatory Visit: Payer: Self-pay | Admitting: Physician Assistant

## 2016-01-27 ENCOUNTER — Encounter (HOSPITAL_COMMUNITY): Payer: Self-pay | Admitting: *Deleted

## 2016-01-27 DIAGNOSIS — R0789 Other chest pain: Secondary | ICD-10-CM

## 2016-01-27 DIAGNOSIS — R079 Chest pain, unspecified: Secondary | ICD-10-CM | POA: Diagnosis not present

## 2016-01-27 DIAGNOSIS — E785 Hyperlipidemia, unspecified: Secondary | ICD-10-CM

## 2016-01-27 DIAGNOSIS — R072 Precordial pain: Secondary | ICD-10-CM

## 2016-01-27 DIAGNOSIS — R001 Bradycardia, unspecified: Secondary | ICD-10-CM | POA: Diagnosis present

## 2016-01-27 DIAGNOSIS — I2510 Atherosclerotic heart disease of native coronary artery without angina pectoris: Secondary | ICD-10-CM | POA: Diagnosis not present

## 2016-01-27 DIAGNOSIS — I25119 Atherosclerotic heart disease of native coronary artery with unspecified angina pectoris: Secondary | ICD-10-CM | POA: Diagnosis not present

## 2016-01-27 DIAGNOSIS — Z720 Tobacco use: Secondary | ICD-10-CM | POA: Diagnosis not present

## 2016-01-27 DIAGNOSIS — E871 Hypo-osmolality and hyponatremia: Secondary | ICD-10-CM | POA: Diagnosis present

## 2016-01-27 LAB — ECHOCARDIOGRAM COMPLETE
E decel time: 359 msec
EERAT: 5.57
FS: 34 % (ref 28–44)
HEIGHTINCHES: 61 in
IVS/LV PW RATIO, ED: 1.42
LA diam end sys: 36 mm
LA diam index: 1.96 cm/m2
LA vol index: 21.4 mL/m2
LASIZE: 36 mm
LAVOL: 39.2 mL
LAVOLA4C: 35.8 mL
LV E/e'average: 5.57
LVEEMED: 5.57
LVELAT: 12.2 cm/s
LVOT area: 4.52 cm2
LVOT diameter: 24 mm
Lateral S' vel: 12.2 cm/s
MV Dec: 359
MVPKAVEL: 56.3 m/s
MVPKEVEL: 67.9 m/s
PW: 7.41 mm — AB (ref 0.6–1.1)
RV sys press: 25 mmHg
Reg peak vel: 236 cm/s
TDI e' lateral: 12.2
TDI e' medial: 7.94
TRMAXVEL: 236 cm/s
Weight: 2667.2 oz

## 2016-01-27 LAB — TROPONIN I: Troponin I: <0.03 ng/mL (ref <0.031)

## 2016-01-27 MED ORDER — HYDROMORPHONE HCL 2 MG PO TABS: 2.0000 mg | ORAL_TABLET | Freq: Four times a day (QID) | ORAL | Status: DC | PRN

## 2016-01-27 MED ORDER — FLUOXETINE HCL 20 MG PO CAPS
20.0000 mg | ORAL_CAPSULE | Freq: Every day | ORAL | Status: DC
  Administered 2016-01-27: 20 mg via ORAL
  Filled 2016-01-27: qty 1

## 2016-01-27 MED ORDER — FLUOXETINE HCL 20 MG PO TABS
20.0000 mg | ORAL_TABLET | Freq: Every day | ORAL | Status: DC
  Filled 2016-01-27: qty 1

## 2016-01-27 MED ORDER — ZOLPIDEM TARTRATE 5 MG PO TABS: 5.0000 mg | ORAL_TABLET | Freq: Every day | ORAL | Status: DC

## 2016-01-27 MED ORDER — ENOXAPARIN SODIUM 40 MG/0.4ML ~~LOC~~ SOLN
40.0000 mg | SUBCUTANEOUS | Status: DC
  Administered 2016-01-27: 40 mg via SUBCUTANEOUS
  Filled 2016-01-27: qty 0.4

## 2016-01-27 MED ORDER — ATORVASTATIN CALCIUM 40 MG PO TABS: 40.0000 mg | ORAL_TABLET | Freq: Every day | ORAL | Status: DC

## 2016-01-27 MED ORDER — CLOPIDOGREL BISULFATE 75 MG PO TABS
75.0000 mg | ORAL_TABLET | Freq: Every day | ORAL | Status: DC
  Administered 2016-01-27: 75 mg via ORAL
  Filled 2016-01-27: qty 1

## 2016-01-27 MED ORDER — SODIUM CHLORIDE 0.9 % IV SOLN
INTRAVENOUS | Status: DC
  Administered 2016-01-27: 05:00:00 via INTRAVENOUS

## 2016-01-27 MED ORDER — DIPHENHYDRAMINE HCL 25 MG PO CAPS
25.0000 mg | ORAL_CAPSULE | Freq: Once | ORAL | Status: AC
  Administered 2016-01-27: 25 mg via ORAL
  Filled 2016-01-27: qty 1

## 2016-01-27 MED ORDER — CIPROFLOXACIN HCL 500 MG PO TABS
500.0000 mg | ORAL_TABLET | Freq: Two times a day (BID) | ORAL | Status: DC
  Administered 2016-01-27: 500 mg via ORAL
  Filled 2016-01-27: qty 1

## 2016-01-27 MED ORDER — ONDANSETRON HCL 4 MG/2ML IJ SOLN: 4.0000 mg | Freq: Four times a day (QID) | INTRAMUSCULAR | Status: DC | PRN

## 2016-01-27 MED ORDER — ACETAMINOPHEN 325 MG PO TABS: 650.0000 mg | ORAL_TABLET | ORAL | Status: DC | PRN

## 2016-01-27 NOTE — Consult Note (Signed)
Requesting provider: Dr. Toy Baker Primary cardiologist: Dr. Dorris Carnes Consulting cardiologist: Dr. Satira Sark  Reason for consultation: CP, known CAD  Clinical Summary Bianca [name] is a 70 y.o.female with past medical history outlined below currently admitted to the hospitalist service with recent chest discomfort. She has a known history of CAD and previous percutaneous interventions as outlined below, most recently in 2013 at which time she had an NSTEMI with findings of moderate multivessel disease and placement of DES to distal RCA with PTCA of the PDA.  She reports being in her usual state of health, taking medications regularly. Thinks that she may have been exposed to "mold" recently. She has been experiencing an atypical chest discomfort, states a "twinging sensation" under her left breast, sporadic, not using nitroglycerin. States that this is not similar to previous angina.  Last ischemic evaluation was by [facility] in May 2015 as outlined below.   Allergies  Allergen Reactions  . Oxycodone Hcl Itching and Other (See Comments)    confusion  . Penicillins Itching and Rash    Has patient had a PCN reaction causing immediate rash, facial/tongue/throat swelling, SOB or lightheadedness with hypotension: yes rash that took a while to go away across the abdomen Has patient had a PCN reaction causing severe rash involving mucus membranes or skin necrosis: no Has patient had a PCN reaction that required hospitalization: no Has patient had a PCN reaction occurring within the last 10 years: No If all of the above answers are "NO", then may proceed with Cephalosporin use.   . Aspirin     Burning in stomach  . Macrobid [Nitrofurantoin Monohyd Macro] Nausea And Vomiting  . Sulfa Antibiotics Nausea Only  . Tramadol Itching    Seeing things    Medications Scheduled Medications: . atorvastatin  40 mg Oral q1800  . ciprofloxacin  500 mg Oral BID  . clopidogrel  75 mg  Oral Daily  . enoxaparin (LOVENOX) injection  40 mg Subcutaneous Q24H  . FLUoxetine  20 mg Oral Daily  . zolpidem  5 mg Oral QHS    Infusions: . sodium chloride 75 mL/hr at 01/27/16 0502    PRN Medications: acetaminophen, HYDROmorphone, ondansetron (ZOFRAN) IV   Past Medical History  Diagnosis Date  . CAD (coronary artery disease)     a. 1997 MI/PCI RCA;  b. 2007 MI/PCI of 100% RCA with Taxus DES x 3 placed, EF 55%;  c. 2010 Cath: stable anatomy;  d. 05/2012 NSTEMI/Cath/PCI: LM nl, LAD 60-16m, D1 20ost, LCX 36m, RCA 40-32m ISR, 60/95d (Treated w/ 2.75x33 Xience Xpedition DES), PDA 56m (Treated w/ PTCA), EF 60%.  . Depression   . Hyperlipidemia   . Carpal tunnel syndrome on both sides   . Numbness of foot     Left foot - back surgery 2009  . GERD (gastroesophageal reflux disease)   . Arthritis   . Chronic lower back pain   . Myocardial infarction [facility]) 1997, 2007, 2013  . Blood transfusion without reported diagnosis     Past Surgical History  Procedure Laterality Date  . Cesarean section  1990  . Ankle surgery  Years ago    Left; "had to take out a floater"  . Posterior fusion lumbar spine  1962; 2009  . Coronary angioplasty with stent placement  1997; 2007, 2013    "2 + 2; + 1= total of 5  . 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 Left heart catheterization with coronary angiogram N/A 05/18/2012    Procedure: LEFT HEART CATHETERIZATION WITH CORONARY ANGIOGRAM;  Surgeon: Wellington Hampshire, MD;  Location: [facility] CATH LAB;  Service: Cardiovascular;  Laterality: N/A;  . Carpal tunnel release Left 10/12016    Family History  Problem Relation Age of Onset  . Coronary artery disease Mother   . Diabetes Mother   . Hypertension Mother   . Dementia Father   . Sudden death Brother     Suicide  . Cancer Paternal Grandfather      Esophageal    Social History Ms. Mua reports that she has been smoking Cigarettes.  She has a 25 pack-year smoking history. She has never used smokeless tobacco. Ms. Bernath reports that she does not drink alcohol.  Review of Systems Complete review of systems negative except as otherwise outlined in the clinical summary and also the following. Intermittent cough, no fevers or chills.  Physical Examination Blood pressure 127/71, pulse 50, temperature 98.3 F (36.8 C), temperature source Oral, resp. rate 18, height 5\' 1"  (1.549 m), weight 166 lb 11.2 oz (75.615 kg), SpO2 96 %. No intake or output data in the 24 hours ending 01/27/16 0924  Telemetry: Sinus rhythm.  Gen.: Patient appears covered blood rest. HEENT: Conjunctiva and lids normal, oropharynx clear. Neck: Supple, no elevated JVP or carotid bruits, no thyromegaly. Lungs: Clear to auscultation, nonlabored breathing at rest. Cardiac: Regular rate and rhythm, no S3 or significant systolic murmur, no pericardial rub. Abdomen: Soft, nontender, bowel sounds present, no guarding or rebound. Extremities: No pitting edema, distal pulses 2+. Skin: Warm and dry. Musculoskeletal: No kyphosis. Neuropsychiatric: Alert and oriented x3, affect grossly appropriate.  Lab Results  Basic Metabolic Panel:  Recent Labs Lab 01/26/16 2045  NA 131*  K 3.5  CL 99*  CO2 25  GLUCOSE 111*  BUN <5*  CREATININE 0.67  CALCIUM 8.8*    CBC:  Recent Labs Lab 01/26/16 2045  WBC 8.8  HGB 12.3  HCT 34.8*  MCV 88.8  PLT 317    Cardiac Enzymes:  Recent Labs Lab 01/27/16 0208 01/27/16 0602  TROPONINI <0.03 <0.03    ECG I personally reviewed the tracing from 01/27/2016 which showed sinus bradycardia.  Imaging Chest x-ray 01/26/2016: FINDINGS: Coronary artery stent overlies the right heart. Stable cardiomediastinal silhouette with normal heart size. No pneumothorax. No pleural effusion. Lungs appear clear, with no  acute consolidative airspace disease and no pulmonary edema.  IMPRESSION: No active cardiopulmonary disease.  Lexiscan Myoview 12/19/2013: FINDINGS: Calculated ejection fraction is 67%. The end systolic volume is 23 mL. End-diastolic volume is 71 mL. Wall motion is normal. There is no ischemia or infarction. No fixed or reversible defects are identified in the myocardium.  IMPRESSION: 1. Normal myocardial perfusion study. No fixed or reversible defects. 2. Calculated ejection fraction of 67%.  Impression  1. Atypical chest pain. ECG shows no acute ST segment changes and troponin I levels are negative. Chest x-ray without acute findings.  2. Multivessel CAD, overall moderate in the left system and status post previous percutaneous interventions to the RCA and PDA as outlined, most recently 2013. Last ischemic evaluation was in 2015.  3. Hyperlipidemia, on Zocor as an outpatient.  Recommendations  Discussed with patient. Recommend follow-up ischemic testing in light of her cardiac history, although symptoms are fairly atypical. She is undergoing an echocardiogram today per primary team. If  LVEF stable, we will arrange an outpatient Lexiscan Myoview on medical therapy in the office and then have her see Dr. Harrington Challenger in follow-up. Continue present outpatient medical regimen.  Satira Sark, M.D., F.A.C.C.

## 2016-01-27 NOTE — Discharge Instructions (Signed)
Follow with Primary MD [name], NP and your primary cardiologist within 1 week    Get CBC, CMP, 2 view Chest X ray checked  by Primary MD next visit.    Activity: As tolerated with Full fall precautions use walker/cane & assistance as needed   Disposition Home     Diet:   Heart Healthy  .  For Heart failure patients - Check your Weight same time everyday, if you gain over 2 pounds, or you develop in leg swelling, experience more shortness of breath or chest pain, call your Primary MD immediately. Follow Cardiac Low Salt Diet and 1.5 lit/day fluid restriction.   On your next visit with your primary care physician please Get Medicines reviewed and adjusted.   Please request your Prim.MD to go over all Hospital Tests and Procedure/Radiological results at the follow up, please get all Hospital records sent to your Prim MD by signing hospital release before you go home.   If you experience worsening of your admission symptoms, develop shortness of breath, life threatening emergency, suicidal or homicidal thoughts you must seek medical attention immediately by calling 911 or calling your MD immediately  if symptoms less severe.  You Must read complete instructions/literature along with all the possible adverse reactions/side effects for all the Medicines you take and that have been prescribed to you. Take any new Medicines after you have completely understood and accpet all the possible adverse reactions/side effects.   Do not drive, operate heavy machinery, perform activities at heights, swimming or participation in water activities or provide baby sitting services if your were admitted for syncope or siezures until you have seen by Primary MD or a Neurologist and advised to do so again.  Do not drive when taking Pain medications.    Do not take more than prescribed Pain, Sleep and Anxiety Medications  Special Instructions: If you have smoked or chewed Tobacco  in the last 2 yrs  please stop smoking, stop any regular Alcohol  and or any Recreational drug use.  Wear Seat belts while driving.   Please note  You were cared for by a hospitalist during your hospital stay. If you have any questions about your discharge medications or the care you received while you were in the hospital after you are discharged, you can call the unit and asked to speak with the hospitalist on call if the hospitalist that took care of you is not available. Once you are discharged, your primary care physician will handle any further medical issues. Please note that NO REFILLS for any discharge medications will be authorized once you are discharged, as it is imperative that you return to your primary care physician (or establish a relationship with a primary care physician if you do not have one) for your aftercare needs so that they can reassess your need for medications and monitor your lab values.

## 2016-01-27 NOTE — Progress Notes (Signed)
Echocardiogram 2D Echocardiogram has been performed.  Tresa Res 01/27/2016, 10:16 AM

## 2016-01-27 NOTE — Discharge Summary (Signed)
Bianca [name], is a 70 y.o. female  DOB [birth year]  MRN [id number].  Admission date:  01/26/2016  Admitting Physician  Toy Baker, MD  Discharge Date:  01/27/2016   Primary MD  [name], NP  Recommendations for primary care physician for things to follow:   Monitor secondary risk factors for CAD, outpatient stress test.   Admission Diagnosis  Chest pain at rest [R07.9]   Discharge Diagnosis  Chest pain at rest [R07.9]    Active Problems:   Hyperlipidemia   Coronary atherosclerosis   Chest pain   Tobacco use   Hyponatremia   Bradycardia      Past Medical History  Diagnosis Date  . CAD (coronary artery disease)     a. 1997 MI/PCI RCA;  b. 2007 MI/PCI of 100% RCA with Taxus DES x 3 placed, EF 55%;  c. 2010 Cath: stable anatomy;  d. 05/2012 NSTEMI/Cath/PCI: LM nl, LAD 60-67m, D1 20ost, LCX 40m, RCA 40-66m ISR, 60/95d (Treated w/ 2.75x33 Xience Xpedition DES), PDA 55m (Treated w/ PTCA), EF 60%.  . Depression   . Hyperlipidemia   . Carpal tunnel syndrome on both sides   . Numbness of foot     Left foot - back surgery 2009  . GERD (gastroesophageal reflux disease)   . Arthritis   . Chronic lower back pain   . Myocardial infarction [facility]) 1997, 2007, 2013  . Blood transfusion without reported diagnosis     Past Surgical History  Procedure Laterality Date  . Cesarean section  1990  . Ankle surgery  Years ago    Left; "had to take out a floater"  . Posterior fusion lumbar spine  1962; 2009  . Coronary angioplasty with stent placement  1997; 2007, 2013    "2 + 2; + 1= total of 5  . 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 Left heart  catheterization with coronary angiogram N/A 05/18/2012    Procedure: LEFT HEART CATHETERIZATION WITH CORONARY ANGIOGRAM;  Surgeon: Wellington Hampshire, MD;  Location: [place] CATH LAB;  Service: Cardiovascular;  Laterality: N/A;  . Carpal tunnel release Left 10/12016       HPI  from the history and physical done on the day of admission:   Bianca [name] is a 70 y.o. female with medical history significant of HTN, CAD say status post MI in 1997, HL, depression depression   Presented with shortness of breath and chest pain associates some nausea started this evening pain is in the left chest radiates to her back. Onset was sudden. Pain has been commenting going lasting less than a minute at a time. Feels like a gas pain. The pressure was feeling somewhat similar to when she had her heart attack but feels less severe. She found out that she has black mold in her house and she is really scared. Feels like she has  not been feeling well for past 1 year.   Recently has been diagnosed with UTI treated with ciprofloxacin  Regarding pertinent Chronic problems: Known history of coronary disease status post MI 1997 treated with PCI to RCA status post Taxus DES 3 the RCA in 2007 in the setting of a mild MI, Echogram in 2010 showed EF 50-60 percent with mild mitral regurgitation. Last time patient was admitted in 2013 with non-ST elevation MI as undergone left heart cath in October 2013 showing mid LAD 60-70 percent D1 20% M CFX 50% proximal to distal RCA multiple overlapping stents, mid 40-50 percent ISR, distal 60-95% she has undergone PCI DES to RCA in both angioplasty to PDA that time was decided if she continues to have restenosis of RCA best next treatment would be CABG. Patient is currently on Plavix  IN ER: Afebrile, heart rate 49, blood pressure 131/61, WBC 8.8, hemoglobin 12.3, sodium 131, creatinine 0.67 glucose 111 troponin 0.01 Chest x-ray unremarkable. Case was discussed by EER provider cardiology  who states it's okay to keep patient at [facility] long and continue to cycle cardiac enzymes     Hospital Course:     1. Atypical chest pain in a patient with CAD - ruled out for MI, EKG nonacute, echogram stable with preserved EF and no wall motion abnormalities. Seen by cardiologist Dr. Domenic Polite, stable to be discharged on home medications, outpatient follow-up with PCP and primary cardiologist for outpatient stress test. Continue home cardiac medications for secondary risk factor modification. Completely symptom free this morning.  2. Dyslipidemia. On statin continue.   3. Hypertension on Lopressor 25 twice a day continue, check blood pressure and heart rate next office visit.       Follow UP  Follow-up Information    Follow up with [name], NP. Schedule an appointment as soon as possible for a visit in 1 week.   Specialty:  Family Medicine   Why:  Also follow with her primary cardiologist within a week   Contact information:   [address] [id number] [phone]        Consults obtained - Cards  Discharge Condition: Stable  Diet and Activity recommendation: See Discharge Instructions below  Discharge Instructions       Discharge Instructions    Diet - low sodium heart healthy    Complete by:  As directed      Discharge instructions    Complete by:  As directed   Follow with Primary MD [name], NP and your primary cardiologist within 1 week    Get CBC, CMP, 2 view Chest X ray checked  by Primary MD next visit.    Activity: As tolerated with Full fall precautions use walker/cane & assistance as needed   Disposition Home     Diet:   Heart Healthy  .  For Heart failure patients - Check your Weight same time everyday, if you gain over 2 pounds, or you develop in leg swelling, experience more shortness of breath or chest pain, call your Primary MD immediately. Follow Cardiac Low Salt Diet and 1.5 lit/day fluid restriction.   On  your next visit with your primary care physician please Get Medicines reviewed and adjusted.   Please request your Prim.MD to go over all Hospital Tests and Procedure/Radiological results at the follow up, please get all Hospital records sent to your Prim MD by signing hospital release before you go home.   If you experience worsening of your admission symptoms,  develop shortness of breath, life threatening emergency, suicidal or homicidal thoughts you must seek medical attention immediately by calling 911 or calling your MD immediately  if symptoms less severe.  You Must read complete instructions/literature along with all the possible adverse reactions/side effects for all the Medicines you take and that have been prescribed to you. Take any new Medicines after you have completely understood and accpet all the possible adverse reactions/side effects.   Do not drive, operate heavy machinery, perform activities at heights, swimming or participation in water activities or provide baby sitting services if your were admitted for syncope or siezures until you have seen by Primary MD or a Neurologist and advised to do so again.  Do not drive when taking Pain medications.    Do not take more than prescribed Pain, Sleep and Anxiety Medications  Special Instructions: If you have smoked or chewed Tobacco  in the last 2 yrs please stop smoking, stop any regular Alcohol  and or any Recreational drug use.  Wear Seat belts while driving.   Please note  You were cared for by a hospitalist during your hospital stay. If you have any questions about your discharge medications or the care you received while you were in the hospital after you are discharged, you can call the unit and asked to speak with the hospitalist on call if the hospitalist that took care of you is not available. Once you are discharged, your primary care physician will handle any further medical issues. Please note that NO REFILLS for any  discharge medications will be authorized once you are discharged, as it is imperative that you return to your primary care physician (or establish a relationship with a primary care physician if you do not have one) for your aftercare needs so that they can reassess your need for medications and monitor your lab values.     Increase activity slowly    Complete by:  As directed              Discharge Medications       Medication List    TAKE these medications        ciprofloxacin 500 MG tablet  Commonly known as:  CIPRO  Take 1 tablet (500 mg total) by mouth 2 (two) times daily.     clopidogrel 75 MG tablet  Commonly known as:  PLAVIX  TAKE 1 TABLET BY MOUTH ONCE DAILY     doxycycline 100 MG capsule  Commonly known as:  VIBRAMYCIN  Take 1 capsule (100 mg total) by mouth 2 (two) times daily.     FLUoxetine 20 MG tablet  Commonly known as:  PROZAC  Take 1 tablet (20 mg total) by mouth daily.     HYDROmorphone 2 MG tablet  Commonly known as:  DILAUDID  Take 2 mg by mouth every 8 (eight) hours as needed. for pain     metoprolol 50 MG tablet  Commonly known as:  LOPRESSOR  TAKE 1/2 TABLETS (25 MG TOTAL) BY MOUTH 2 (TWO) TIMES DAILY.     PROBIOTIC PO  Take 1 capsule by mouth daily.     RED YEAST RICE PO  Take 1 capsule by mouth daily.     simvastatin 80 MG tablet  Commonly known as:  ZOCOR  TAKE 1 TABLET BY MOUTH AT BEDTIME     terconazole 0.4 % vaginal cream  Commonly known as:  TERAZOL 7  Place 1 applicator vaginally at bedtime. One applicator full QHS for  seven days of therapy     UNABLE TO FIND  Take 1 capsule by mouth daily. OTC Eye vitamin     UNABLE TO FIND  Inject 1 mL into the skin every 14 (fourteen) days. b12 shots twice a month at MDs office     zolpidem 5 MG tablet  Commonly known as:  AMBIEN  Take 1 tablet (5 mg total) by mouth at bedtime.        Major procedures and Radiology Reports - PLEASE review detailed and final reports for all  details, in brief -   TTE  Left ventricle: The cavity size was normal. Systolic function was normal. The estimated ejection fraction was in the range of 55% to 60%. Wall motion was normal; there were no regional wall motion abnormalities. - Mitral valve: There was trivial regurgitation. - Tricuspid valve: There was trivial regurgitation.   Dg Chest 2 View  01/26/2016  CLINICAL DATA:  Chest pain EXAM: CHEST  2 VIEW COMPARISON:  12/24/2015 chest radiograph. FINDINGS: Coronary artery stent overlies the right heart. Stable cardiomediastinal silhouette with normal heart size. No pneumothorax. No pleural effusion. Lungs appear clear, with no acute consolidative airspace disease and no pulmonary edema. IMPRESSION: No active cardiopulmonary disease. Electronically Signed   By: Ilona Sorrel M.D.   On: 01/26/2016 21:06    Micro Results      Recent Results (from the past 240 hour(s))  Urine culture     Status: None   Collection Time: 01/22/16  2:02 PM  Result Value Ref Range Status   Culture KLEBSIELLA PNEUMONIAE  Final   Colony Count >=100,000 COLONIES/ML  Final   Organism ID, Bacteria KLEBSIELLA PNEUMONIAE  Final      Susceptibility   Klebsiella pneumoniae -  (no method available)    AMPICILLIN  Resistant     AMOX/CLAVULANIC <=2 Sensitive     AMPICILLIN/SULBACTAM <=2 Sensitive     PIP/TAZO <=4 Sensitive     IMIPENEM <=0.25 Sensitive     CEFAZOLIN <=4 Not Reportable     CEFTRIAXONE <=1 Sensitive     CEFTAZIDIME <=1 Sensitive     CEFEPIME <=1 Sensitive     GENTAMICIN <=1 Sensitive     TOBRAMYCIN <=1 Sensitive     CIPROFLOXACIN <=0.25 Sensitive     LEVOFLOXACIN <=0.12 Sensitive     NITROFURANTOIN <=16 Sensitive     TRIMETH/SULFA* <=20 Sensitive      * NR=NOT REPORTABLE,SEE COMMENTORAL therapy:A cefazolin MIC of <32 predicts susceptibility to the oral agents cefaclor,cefdinir,cefpodoxime,cefprozil,cefuroxime,cephalexin,and loracarbef when used for therapy of uncomplicated UTIs due to  E.coli,K.pneumomiae,and P.mirabilis. PARENTERAL therapy: A cefazolinMIC of >8 indicates resistance to parenteralcefazolin. An alternate test method must beperformed to confirm susceptibility to parenteralcefazolin.   Today   Subjective    Bianca [name] today has no headache,no chest abdominal pain,no new weakness tingling or numbness, feels much better wants to go home today.    Objective   Blood pressure 127/71, pulse 50, temperature 98.3 F (36.8 C), temperature source Oral, resp. rate 18, height 5\' 1"  (1.549 m), weight 75.615 kg (166 lb 11.2 oz), SpO2 96 %.  No intake or output data in the 24 hours ending 01/27/16 1103  Exam Awake Alert, Oriented x 3, No new F.N deficits, Normal affect Lane.AT,PERRAL Supple Neck,No JVD, No cervical lymphadenopathy appriciated.  Symmetrical Chest wall movement, Good air movement bilaterally, CTAB RRR,No Gallops,Rubs or new Murmurs, No Parasternal Heave +ve B.Sounds, Abd Soft, Non tender, No organomegaly appriciated, No rebound -guarding or rigidity. No  Cyanosis, Clubbing or edema, No new Rash or bruise   Data Review   CBC w Diff:  Lab Results  Component Value Date   WBC 8.8 01/26/2016   HGB 12.3 01/26/2016   HCT 34.8* 01/26/2016   PLT 317 01/26/2016   LYMPHOPCT 42.1 08/02/2015   MONOPCT 10.4 08/02/2015   EOSPCT 2.8 08/02/2015   BASOPCT 0.5 08/02/2015    CMP:  Lab Results  Component Value Date   NA 131* 01/26/2016   K 3.5 01/26/2016   CL 99* 01/26/2016   CO2 25 01/26/2016   BUN <5* 01/26/2016   CREATININE 0.67 01/26/2016   PROT 7.0 02/14/2015   ALBUMIN 4.0 02/14/2015   BILITOT 0.6 02/14/2015   ALKPHOS 73 02/14/2015   AST 22 02/14/2015   ALT 13 02/14/2015  . Lab Results  Component Value Date   CKTOTAL 97 05/18/2012   CKMB 5.3* 05/18/2012   TROPONINI <0.03 01/27/2016   Lab Results  Component Value Date   TSH 3.53 02/14/2015     Total Time in preparing paper work, data evaluation and todays exam - 35  minutes  Thurnell Lose M.D on 01/27/2016 at 11:03 AM  [facility]   Office  [phone]

## 2016-01-29 ENCOUNTER — Telehealth: Payer: Self-pay | Admitting: *Deleted

## 2016-01-29 NOTE — Telephone Encounter (Signed)
Left message to call back  

## 2016-01-29 NOTE — Telephone Encounter (Signed)
-----   Message from Liliane Shi, PA-C sent at 01/27/2016  9:38 AM EDT ----- Regarding: Needs stress test and follow up scheduled. Patient seen by Dr. Rozann Lesches at [facility] 01/27/2016 for consult for chest pain. Patient needs outpatient Lexiscan Myoview. Please call patient to arrange Lexiscan Myoview in the next 1 week. (I will put order in.  Please make sure results go to Dr. Harrington Challenger as well.) Please schedule follow up with Dr. Harrington Challenger or PA/NP on a day Dr. Harrington Challenger in the office. Richardson Dopp, PA-C   01/27/2016 9:39 AM

## 2016-01-30 ENCOUNTER — Ambulatory Visit (INDEPENDENT_AMBULATORY_CARE_PROVIDER_SITE_OTHER): Payer: Managed Care, Other (non HMO) | Admitting: Family Medicine

## 2016-01-30 VITALS — BP 120/74 | HR 80 | Temp 98.4°F | Ht 61.0 in | Wt 164.0 lb

## 2016-01-30 DIAGNOSIS — F329 Major depressive disorder, single episode, unspecified: Secondary | ICD-10-CM

## 2016-01-30 DIAGNOSIS — K219 Gastro-esophageal reflux disease without esophagitis: Secondary | ICD-10-CM | POA: Diagnosis not present

## 2016-01-30 DIAGNOSIS — R0789 Other chest pain: Secondary | ICD-10-CM | POA: Diagnosis not present

## 2016-01-30 DIAGNOSIS — F32A Depression, unspecified: Secondary | ICD-10-CM

## 2016-01-30 MED ORDER — BUPROPION HCL ER (XL) 150 MG PO TB24: 150.0000 mg | ORAL_TABLET | Freq: Every day | ORAL | Status: DC

## 2016-01-30 NOTE — Progress Notes (Signed)
Pre visit review using our clinic review tool, if applicable. No additional management support is needed unless otherwise documented below in the visit note. 

## 2016-01-30 NOTE — Progress Notes (Signed)
Subjective:    Patient ID: Bianca [name], female    DOB: 1945/10/07, 70 y.o.   MRN: [id number]  HPI Patient here for recent hospital follow-up She was admitted on June 10 and discharged June 11. She has long history of CAD with multiple stents and was admitted with atypical chest pain. Ruled out for MI. Echocardiogram stable with preserved EF and no wall motion abnormalities. She is in process of being scheduled for follow-up with cardiology. She has continued to have some atypical pains with upper abdominal pressure sensation that radiates to the back bilaterally. Nonexertional. Symptoms usually only lasts seconds. She does feel that the symptoms are more frequent after eating.  She recently saw ENT and had what sounds like nasolaryngoscopy. She's been on omeprazole 40 mg daily for about one month-with no change in symptoms. She has occasional dysphagia symptoms. Decreased appetite. 5 pound weight loss past 2 months. Frequent postnasal drip symptoms. She has some chronic dyspnea with exertion which is basically unchanged. She had EGD back in October 2011 which is basically normal  She is complaining of increased depression symptoms. She is currently on fluoxetine 20 mg daily. She was previously described Wellbutrin but never started this. She has depressed mood and low motivation. No suicidal ideation. She's had progression and depression symptoms for several months.  Past Medical History  Diagnosis Date  . CAD (coronary artery disease)     a. 1997 MI/PCI RCA;  b. 2007 MI/PCI of 100% RCA with Taxus DES x 3 placed, EF 55%;  c. 2010 Cath: stable anatomy;  d. 05/2012 NSTEMI/Cath/PCI: LM nl, LAD 60-66m, D1 20ost, LCX 102m, RCA 40-74m ISR, 60/95d (Treated w/ 2.75x33 Xience Xpedition DES), PDA 54m (Treated w/ PTCA), EF 60%.  . Depression   . Hyperlipidemia   . Carpal tunnel syndrome on both sides   . Numbness of foot     Left foot - back surgery 2009  . GERD (gastroesophageal reflux disease)   .  Arthritis   . Chronic lower back pain   . Myocardial infarction [facility]) 1997, 2007, 2013  . Blood transfusion without reported diagnosis    Past Surgical History  Procedure Laterality Date  . Cesarean section  1990  . Ankle surgery  Years ago    Left; "had to take out a floater"  . Posterior fusion lumbar spine  1962; 2009  . Coronary angioplasty with stent placement  1997; 2007, 2013    "2 + 2; + 1= total of 5  . 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 Left heart catheterization with coronary angiogram N/A 05/18/2012    Procedure: LEFT HEART CATHETERIZATION WITH CORONARY ANGIOGRAM;  Surgeon: Wellington Hampshire, MD;  Location: [place] CATH LAB;  Service: Cardiovascular;  Laterality: N/A;  . Carpal tunnel release Left 10/12016    reports that she has been smoking Cigarettes.  She has a 25 pack-year smoking history. She has never used smokeless tobacco. She reports that she does not drink alcohol or use illicit drugs. family history includes Cancer in her paternal grandfather; Coronary artery disease in her mother; Dementia in her father; Diabetes in her mother; Hypertension in her mother; Sudden death in her brother.     Review of Systems  Constitutional: Positive for appetite change and unexpected weight change. Negative for fever and chills.  HENT: Positive for congestion. Negative  for voice change.   Respiratory: Positive for shortness of breath. Negative for cough.   Cardiovascular: Positive for chest pain. Negative for palpitations and leg swelling.  Gastrointestinal: Positive for abdominal pain. Negative for nausea, vomiting and abdominal distention.  Psychiatric/Behavioral: Positive for dysphoric mood. Negative for suicidal ideas.       Objective:   Physical Exam  Constitutional: She appears well-developed and well-nourished.    Neck: Neck supple. No thyromegaly present.  Cardiovascular: Normal rate and regular rhythm.   Pulmonary/Chest: Effort normal and breath sounds normal. No respiratory distress. She has no wheezes. She has no rales.  Abdominal: Soft. She exhibits no mass. There is no tenderness.  Musculoskeletal: She exhibits no edema.  Psychiatric: She has a normal mood and affect. Her behavior is normal. Judgment and thought content normal.  PH Q-9 score 15          Assessment & Plan:  #1 recent atypical chest pain and a patient with known history of CAD. She has pending cardiology follow-up. Symptoms are very atypical. Recent rule out for MI Her current symptoms are not consistent with her prior angina symptoms.  #2 depression. PH Q-9 score 15. Continue fluoxetine. We have recommend starting Wellbutrin XL 150 mg once daily and follow-up with primary in 3 weeks to reassess. Consider further titration of fluoxetine to 40 mg a that point if not improved  #3 probable GERD symptoms. Occasional dysphagia. Cardiology workup pending. If unremarkable, we have encouraged her to follow-up with GI. May need EGD to further investigate- especially if she continues to have any appetite or weight changes  Eulas Post MD [facility]

## 2016-01-30 NOTE — Telephone Encounter (Signed)
Left message to call back  

## 2016-01-30 NOTE — Patient Instructions (Signed)
Add the Wellbutrin to Fluoxetine Schedule follow up with [facility] in 3-4 weeks Follow up with Cardiology. I recommend follow up with Dr Carlean Purl if you have any progressive/persistent swallowing difficulty.

## 2016-01-31 NOTE — Telephone Encounter (Signed)
Spoke with patient and advised her of need for lexiscan myoview per Richardson Dopp, PA.  The order is in epic and I have sent a message to [facility] for date/time.  I advised her that someone from our office will call her to schedule this test.  I advised her that an appointment with Dr. Harrington Challenger or one of our APPs on a day Dr. Harrington Challenger is in the office is also needed after the [facility].  I scheduled her with Almyra Deforest, PA on July 3 which is a day Dr. Harrington Challenger is in the office.  She verbalized understanding and agreement with plan of care.

## 2016-02-08 ENCOUNTER — Telehealth: Payer: Self-pay | Admitting: Adult Health

## 2016-02-08 NOTE — Telephone Encounter (Signed)
Pt would like for you to know that since her visit with Dr. Elease Hashimoto on 6/14 her symptoms for mold and depression has gotten worse. Pt would like to know what she should do from here.  Pt would like a call back.

## 2016-02-08 NOTE — Telephone Encounter (Signed)
Please call pt back and have her "follow up depression" with Tommi Rumps her PCP. After scheduled please send back so I can send this to [facility] as an [place]. Thanks.

## 2016-02-09 ENCOUNTER — Other Ambulatory Visit: Payer: Self-pay

## 2016-02-09 ENCOUNTER — Encounter (HOSPITAL_COMMUNITY): Payer: Self-pay | Admitting: Emergency Medicine

## 2016-02-09 ENCOUNTER — Emergency Department (HOSPITAL_COMMUNITY)
Admission: EM | Admit: 2016-02-09 | Discharge: 2016-02-10 | Disposition: A | Discharge status: Home / Self Care | Payer: Managed Care, Other (non HMO) | Attending: Emergency Medicine | Admitting: Emergency Medicine

## 2016-02-09 DIAGNOSIS — N761 Subacute and chronic vaginitis: Secondary | ICD-10-CM | POA: Diagnosis not present

## 2016-02-09 DIAGNOSIS — R350 Frequency of micturition: Secondary | ICD-10-CM

## 2016-02-09 DIAGNOSIS — I2510 Atherosclerotic heart disease of native coronary artery without angina pectoris: Secondary | ICD-10-CM | POA: Insufficient documentation

## 2016-02-09 DIAGNOSIS — F17210 Nicotine dependence, cigarettes, uncomplicated: Secondary | ICD-10-CM | POA: Insufficient documentation

## 2016-02-09 DIAGNOSIS — I252 Old myocardial infarction: Secondary | ICD-10-CM | POA: Diagnosis not present

## 2016-02-09 DIAGNOSIS — E785 Hyperlipidemia, unspecified: Secondary | ICD-10-CM | POA: Diagnosis not present

## 2016-02-09 DIAGNOSIS — M1990 Unspecified osteoarthritis, unspecified site: Secondary | ICD-10-CM | POA: Diagnosis not present

## 2016-02-09 DIAGNOSIS — Z79899 Other long term (current) drug therapy: Secondary | ICD-10-CM | POA: Insufficient documentation

## 2016-02-09 DIAGNOSIS — Z7902 Long term (current) use of antithrombotics/antiplatelets: Secondary | ICD-10-CM | POA: Insufficient documentation

## 2016-02-09 DIAGNOSIS — R0789 Other chest pain: Secondary | ICD-10-CM | POA: Insufficient documentation

## 2016-02-09 DIAGNOSIS — F32A Depression, unspecified: Secondary | ICD-10-CM

## 2016-02-09 DIAGNOSIS — Z955 Presence of coronary angioplasty implant and graft: Secondary | ICD-10-CM | POA: Diagnosis not present

## 2016-02-09 DIAGNOSIS — F329 Major depressive disorder, single episode, unspecified: Secondary | ICD-10-CM | POA: Diagnosis not present

## 2016-02-09 DIAGNOSIS — F332 Major depressive disorder, recurrent severe without psychotic features: Secondary | ICD-10-CM

## 2016-02-09 LAB — CBC WITH DIFFERENTIAL/PLATELET
BASOS ABS: 0 10*3/uL (ref 0.0–0.1)
Basophils Relative: 0 %
EOS PCT: 0 %
Eosinophils Absolute: 0 10*3/uL (ref 0.0–0.7)
HCT: 35.2 % — ABNORMAL LOW (ref 36.0–46.0)
HEMOGLOBIN: 12.9 g/dL (ref 12.0–15.0)
LYMPHS PCT: 17 %
Lymphs Abs: 1.2 10*3/uL (ref 0.7–4.0)
MCH: 31.3 pg (ref 26.0–34.0)
MCHC: 36.6 g/dL — ABNORMAL HIGH (ref 30.0–36.0)
MCV: 85.4 fL (ref 78.0–100.0)
Monocytes Absolute: 0.6 10*3/uL (ref 0.1–1.0)
Monocytes Relative: 9 %
NEUTROS ABS: 5.3 10*3/uL (ref 1.7–7.7)
NEUTROS PCT: 74 %
PLATELETS: 354 10*3/uL (ref 150–400)
RBC: 4.12 MIL/uL (ref 3.87–5.11)
RDW: 11.9 % (ref 11.5–15.5)
WBC: 7.2 10*3/uL (ref 4.0–10.5)

## 2016-02-09 LAB — COMPREHENSIVE METABOLIC PANEL
ALT: 11 U/L — ABNORMAL LOW (ref 14–54)
ANION GAP: 9 (ref 5–15)
AST: 22 U/L (ref 15–41)
Albumin: 3.6 g/dL (ref 3.5–5.0)
Alkaline Phosphatase: 90 U/L (ref 38–126)
BILIRUBIN TOTAL: 0.9 mg/dL (ref 0.3–1.2)
CHLORIDE: 102 mmol/L (ref 101–111)
CO2: 24 mmol/L (ref 22–32)
Calcium: 9.4 mg/dL (ref 8.9–10.3)
Creatinine, Ser: 0.69 mg/dL (ref 0.44–1.00)
Glucose, Bld: 112 mg/dL — ABNORMAL HIGH (ref 65–99)
POTASSIUM: 3.7 mmol/L (ref 3.5–5.1)
Sodium: 135 mmol/L (ref 135–145)
TOTAL PROTEIN: 7.3 g/dL (ref 6.5–8.1)

## 2016-02-09 LAB — URINALYSIS, ROUTINE W REFLEX MICROSCOPIC
Bilirubin Urine: NEGATIVE
Glucose, UA: NEGATIVE mg/dL
KETONES UR: NEGATIVE mg/dL
LEUKOCYTES UA: NEGATIVE
NITRITE: NEGATIVE
PH: 6.5 (ref 5.0–8.0)
PROTEIN: NEGATIVE mg/dL
Specific Gravity, Urine: 1.005 (ref 1.005–1.030)

## 2016-02-09 LAB — WET PREP, GENITAL
Clue Cells Wet Prep HPF POC: NONE SEEN
Sperm: NONE SEEN
TRICH WET PREP: NONE SEEN
YEAST WET PREP: NONE SEEN

## 2016-02-09 LAB — LIPASE, BLOOD: Lipase: 20 U/L (ref 11–51)

## 2016-02-09 LAB — URINE MICROSCOPIC-ADD ON
Bacteria, UA: NONE SEEN
WBC, UA: NONE SEEN WBC/hpf (ref 0–5)

## 2016-02-09 MED ORDER — SODIUM CHLORIDE 0.9 % IV BOLUS (SEPSIS)
500.0000 mL | Freq: Once | INTRAVENOUS | Status: AC
  Administered 2016-02-09: 500 mL via INTRAVENOUS

## 2016-02-09 MED ORDER — FLUOXETINE HCL 20 MG PO CAPS
20.0000 mg | ORAL_CAPSULE | Freq: Every day | ORAL | Status: DC
  Administered 2016-02-10: 20 mg via ORAL
  Filled 2016-02-09: qty 1

## 2016-02-09 MED ORDER — LORAZEPAM 1 MG PO TABS: 1.0000 mg | ORAL_TABLET | Freq: Three times a day (TID) | ORAL | Status: DC | PRN

## 2016-02-09 MED ORDER — ZOLPIDEM TARTRATE 5 MG PO TABS
5.0000 mg | ORAL_TABLET | Freq: Every evening | ORAL | Status: DC | PRN
  Administered 2016-02-10: 5 mg via ORAL
  Filled 2016-02-09: qty 1

## 2016-02-09 MED ORDER — ONDANSETRON HCL 4 MG/2ML IJ SOLN
4.0000 mg | Freq: Once | INTRAMUSCULAR | Status: AC
  Administered 2016-02-09: 4 mg via INTRAVENOUS
  Filled 2016-02-09: qty 2

## 2016-02-09 MED ORDER — METOPROLOL TARTRATE 25 MG PO TABS
12.5000 mg | ORAL_TABLET | Freq: Two times a day (BID) | ORAL | Status: DC
  Administered 2016-02-09 – 2016-02-10 (×2): 12.5 mg via ORAL
  Filled 2016-02-09 (×2): qty 1

## 2016-02-09 MED ORDER — CLOPIDOGREL BISULFATE 75 MG PO TABS
75.0000 mg | ORAL_TABLET | Freq: Every day | ORAL | Status: DC
  Administered 2016-02-09 – 2016-02-10 (×2): 75 mg via ORAL
  Filled 2016-02-09 (×3): qty 1

## 2016-02-09 MED ORDER — MICONAZOLE NITRATE 2 % VA CREA
1.0000 | TOPICAL_CREAM | Freq: Every day | VAGINAL | Status: DC
  Administered 2016-02-09: 1 via VAGINAL
  Filled 2016-02-09: qty 45

## 2016-02-09 MED ORDER — ALUM & MAG HYDROXIDE-SIMETH 200-200-20 MG/5ML PO SUSP
30.0000 mL | ORAL | Status: DC | PRN
  Administered 2016-02-10: 30 mL via ORAL
  Filled 2016-02-09: qty 30

## 2016-02-09 MED ORDER — HYDROMORPHONE HCL 2 MG PO TABS
2.0000 mg | ORAL_TABLET | ORAL | Status: AC
  Administered 2016-02-09: 2 mg via ORAL
  Filled 2016-02-09: qty 1

## 2016-02-09 MED ORDER — BUPROPION HCL ER (XL) 150 MG PO TB24
150.0000 mg | ORAL_TABLET | Freq: Every day | ORAL | Status: DC
  Administered 2016-02-09: 150 mg via ORAL
  Filled 2016-02-09 (×2): qty 1

## 2016-02-09 MED ORDER — ONDANSETRON HCL 4 MG PO TABS: 4.0000 mg | ORAL_TABLET | Freq: Three times a day (TID) | ORAL | Status: DC | PRN

## 2016-02-09 MED ORDER — ONDANSETRON HCL 4 MG/2ML IJ SOLN
4.0000 mg | Freq: Once | INTRAMUSCULAR | Status: AC | PRN
  Administered 2016-02-09: 4 mg via INTRAVENOUS
  Filled 2016-02-09: qty 2

## 2016-02-09 MED ORDER — HYDROMORPHONE HCL 1 MG/ML IJ SOLN
0.5000 mg | Freq: Once | INTRAMUSCULAR | Status: AC
  Administered 2016-02-09: 0.5 mg via INTRAVENOUS
  Filled 2016-02-09: qty 1

## 2016-02-09 MED ORDER — NICOTINE 21 MG/24HR TD PT24: 21.0000 mg | MEDICATED_PATCH | Freq: Every day | TRANSDERMAL | Status: DC

## 2016-02-09 MED ORDER — HYDROMORPHONE HCL 2 MG PO TABS
2.0000 mg | ORAL_TABLET | Freq: Three times a day (TID) | ORAL | Status: DC | PRN
  Administered 2016-02-10 (×2): 2 mg via ORAL
  Filled 2016-02-09 (×2): qty 1

## 2016-02-09 NOTE — ED Notes (Signed)
Pt has denied SI throughout ED visit

## 2016-02-09 NOTE — ED Notes (Addendum)
Pt changed into paper scrubs, wanded, and belongings to be secured once transferred to [facility]

## 2016-02-09 NOTE — ED Notes (Cosign Needed)
Pt agrees to stay over night to be observed and to be evaluated by psychiatrist tomorrow morning.  She does not exhibit SI/HI at this time.    Domenic Moras, PA-C 02/09/16 2049

## 2016-02-09 NOTE — ED Notes (Signed)
Pt now having dry heaves.

## 2016-02-09 NOTE — ED Notes (Addendum)
Per EMS. Pt reports vaginal burning that began last night. Also complains of depression for the past 2 weeks. Also complains of chest discomfort with standing for the past 2 weeks. Was seen here 2 weeks ago for same and also by PCP 3 days ago for same. Pt denies vaginal discharge. Does have dysuria as well. Denies SI/HI; feels like she is afraid she will "lose control." Only new symptom with CP since last visit to healthcare provider is increased nausea.

## 2016-02-09 NOTE — Discharge Instructions (Signed)
Read the information below.  You may return to the Emergency Department at any time for worsening condition or any new symptoms that concern you.   Major Depressive Disorder Major depressive disorder is a mental illness. It also may be called clinical depression or unipolar depression. Major depressive disorder usually causes feelings of sadness, hopelessness, or helplessness. Some people with this disorder do not feel particularly sad but lose interest in doing things they used to enjoy (anhedonia). Major depressive disorder also can cause physical symptoms. It can interfere with work, school, relationships, and other normal everyday activities. The disorder varies in severity but is longer lasting and more serious than the sadness we all feel from time to time in our lives. Major depressive disorder often is triggered by stressful life events or major life changes. Examples of these triggers include divorce, loss of your job or home, a move, and the death of a family member or close friend. Sometimes this disorder occurs for no obvious reason at all. People who have family members with major depressive disorder or bipolar disorder are at higher risk for developing this disorder, with or without life stressors. Major depressive disorder can occur at any age. It may occur just once in your life (single episode major depressive disorder). It may occur multiple times (recurrent major depressive disorder). SYMPTOMS People with major depressive disorder have either anhedonia or depressed mood on nearly a daily basis for at least 2 weeks or longer. Symptoms of depressed mood include:  Feelings of sadness (blue or down in the dumps) or emptiness.  Feelings of hopelessness or helplessness.  Tearfulness or episodes of crying (may be observed by others).  Irritability (children and adolescents). In addition to depressed mood or anhedonia or both, people with this disorder have at least four of the following  symptoms:  Difficulty sleeping or sleeping too much.   Significant change (increase or decrease) in appetite or weight.   Lack of energy or motivation.  Feelings of guilt and worthlessness.   Difficulty concentrating, remembering, or making decisions.  Unusually slow movement (psychomotor retardation) or restlessness (as observed by others).   Recurrent wishes for death, recurrent thoughts of self-harm (suicide), or a suicide attempt. People with major depressive disorder commonly have persistent negative thoughts about themselves, other people, and the world. People with severe major depressive disorder may experiencedistorted beliefs or perceptions about the world (psychotic delusions). They also may see or hear things that are not real (psychotic hallucinations). DIAGNOSIS Major depressive disorder is diagnosed through an assessment by your health care provider. Your health care provider will ask aboutaspects of your daily life, such as mood,sleep, and appetite, to see if you have the diagnostic symptoms of major depressive disorder. Your health care provider may ask about your medical history and use of alcohol or drugs, including prescription medicines. Your health care provider also may do a physical exam and blood work. This is because certain medical conditions and the use of certain substances can cause major depressive disorder-like symptoms (secondary depression). Your health care provider also may refer you to a mental health specialist for further evaluation and treatment. TREATMENT It is important to recognize the symptoms of major depressive disorder and seek treatment. The following treatments can be prescribed for this disorder:   Medicine. Antidepressant medicines usually are prescribed. Antidepressant medicines are thought to correct chemical imbalances in the brain that are commonly associated with major depressive disorder. Other types of medicine may be added if the  symptoms do not  respond to antidepressant medicines alone or if psychotic delusions or hallucinations occur.  Talk therapy. Talk therapy can be helpful in treating major depressive disorder by providing support, education, and guidance. Certain types of talk therapy also can help with negative thinking (cognitive behavioral therapy) and with relationship issues that trigger this disorder (interpersonal therapy). A mental health specialist can help determine which treatment is best for you. Most people with major depressive disorder do well with a combination of medicine and talk therapy. Treatments involving electrical stimulation of the brain can be used in situations with extremely severe symptoms or when medicine and talk therapy do not work over time. These treatments include electroconvulsive therapy, transcranial magnetic stimulation, and vagal nerve stimulation.   This information is not intended to replace advice given to you by your health care provider. Make sure you discuss any questions you have with your health care provider.   Document Released: 11/29/2012 Document Revised: 08/25/2014 Document Reviewed: 11/29/2012 Elsevier Interactive Patient Education 2016 [employer].  Vaginitis Vaginitis is an inflammation of the vagina. It is most often caused by a change in the normal balance of the bacteria and yeast that live in the vagina. This change in balance causes an overgrowth of certain bacteria or yeast, which causes the inflammation. There are different types of vaginitis, but the most common types are:  Bacterial vaginosis.  Yeast infection (candidiasis).  Trichomoniasis vaginitis. This is a sexually transmitted infection (STI).  Viral vaginitis.  Atrophic vaginitis.  Allergic vaginitis. CAUSES  The cause depends on the type of vaginitis. Vaginitis can be caused by:  Bacteria (bacterial vaginosis).  Yeast (yeast infection).  A parasite (trichomoniasis vaginitis)  A  virus (viral vaginitis).  Low hormone levels (atrophic vaginitis). Low hormone levels can occur during pregnancy, breastfeeding, or after menopause.  Irritants, such as bubble baths, scented tampons, and feminine sprays (allergic vaginitis). Other factors can change the normal balance of the yeast and bacteria that live in the vagina. These include:  Antibiotic medicines.  Poor hygiene.  Diaphragms, vaginal sponges, spermicides, birth control pills, and intrauterine devices (IUD).  Sexual intercourse.  Infection.  Uncontrolled diabetes.  A weakened immune system. SYMPTOMS  Symptoms can vary depending on the cause of the vaginitis. Common symptoms include:  Abnormal vaginal discharge.  The discharge is white, gray, or yellow with bacterial vaginosis.  The discharge is thick, white, and cheesy with a yeast infection.  The discharge is frothy and yellow or greenish with trichomoniasis.  A bad vaginal odor.  The odor is fishy with bacterial vaginosis.  Vaginal itching, pain, or swelling.  Painful intercourse.  Pain or burning when urinating. Sometimes, there are no symptoms. TREATMENT  Treatment will vary depending on the type of infection.   Bacterial vaginosis and trichomoniasis are often treated with antibiotic creams or pills.  Yeast infections are often treated with antifungal medicines, such as vaginal creams or suppositories.  Viral vaginitis has no cure, but symptoms can be treated with medicines that relieve discomfort. Your sexual partner should be treated as well.  Atrophic vaginitis may be treated with an estrogen cream, pill, suppository, or vaginal ring. If vaginal dryness occurs, lubricants and moisturizing creams may help. You may be told to avoid scented soaps, sprays, or douches.  Allergic vaginitis treatment involves quitting the use of the product that is causing the problem. Vaginal creams can be used to treat the symptoms. HOME CARE INSTRUCTIONS     Take all medicines as directed by your caregiver.  Keep your  genital area clean and dry. Avoid soap and only rinse the area with water.  Avoid douching. It can remove the healthy bacteria in the vagina.  Do not use tampons or have sexual intercourse until your vaginitis has been treated. Use sanitary pads while you have vaginitis.  Wipe from front to back. This avoids the spread of bacteria from the rectum to the vagina.  Let air reach your genital area.  Wear cotton underwear to decrease moisture buildup.  Avoid wearing underwear while you sleep until your vaginitis is gone.  Avoid tight pants and underwear or nylons without a cotton panel.  Take off wet clothing (especially bathing suits) as soon as possible.  Use mild, non-scented products. Avoid using irritants, such as:  Scented feminine sprays.  Fabric softeners.  Scented detergents.  Scented tampons.  Scented soaps or bubble baths.  Practice safe sex and use condoms. Condoms may prevent the spread of trichomoniasis and viral vaginitis. SEEK MEDICAL CARE IF:   You have abdominal pain.  You have a fever or persistent symptoms for more than 2-3 days.  You have a fever and your symptoms suddenly get worse.   This information is not intended to replace advice given to you by your health care provider. Make sure you discuss any questions you have with your health care provider.   Document Released: 06/01/2007 Document Revised: 12/19/2014 Document Reviewed: 01/15/2012 Elsevier Interactive Patient Education [employer].

## 2016-02-09 NOTE — ED Notes (Signed)
Bed: WA06 Expected date:  Expected time:  Means of arrival:  Comments: EMS- 70 yo multiple complaints

## 2016-02-09 NOTE — ED Notes (Signed)
PA at bedside.

## 2016-02-09 NOTE — ED Notes (Signed)
Report given to [facility] nurse Bethena Roys, RN.

## 2016-02-09 NOTE — ED Provider Notes (Signed)
CSN: [id number]     Arrival date & time 02/09/16  0957 History   First MD Initiated Contact with Patient 02/09/16 1004     Chief Complaint  Patient presents with  . Vaginitis  . Depression  . Chest Pain     (Consider location/radiation/quality/duration/timing/severity/associated sxs/prior Treatment) The history is provided by the patient and the spouse.    Pt presents with multiple complaints.   Primarily patient is concerned because she is depressed and feels that her depression has gotten so much worse that she is "not functioning."  States she is not eating or sleeping, doesn't want to talk.  Is "jumping out of my skin" and "ready to lose it."  She is very anxious and was up all night because of it, unable to sleep.  Is taking prozac and recently started taking wellbutrin.  Pt has chronic back pain and ran out of her dilaudid 1-2 days ago.  Because she has been out, she feels she is now aware of other symptoms such as vaginal burning and urinary frequency.  Has had intermittent problems with her bladder and with vaginal burning for months, but symptoms have gotten worse or become more noticeable in the past 2 days.  Today she also developed nausea and dry heaving.    Has been admitted and seen by cardiology recently for chest pain.  States she has had daily chest tightness that occurs when she stands up that she believes is related to recently finding mold in her house.  She has also had nasal congestion.  The chest pain that she has experienced today is the same as the chest pain she has had for sometime, no changes.  She does have hx MI and multiple cardiac stents.    Chronic back pain is unchanged.    Past Medical History  Diagnosis Date  . CAD (coronary artery disease)     a. 1997 MI/PCI RCA;  b. 2007 MI/PCI of 100% RCA with Taxus DES x 3 placed, EF 55%;  c. 2010 Cath: stable anatomy;  d. 05/2012 NSTEMI/Cath/PCI: LM nl, LAD 60-70m, D1 20ost, LCX 31m, RCA 40-12m ISR, 60/95d (Treated  w/ 2.75x33 Xience Xpedition DES), PDA 95m (Treated w/ PTCA), EF 60%.  . Depression   . Hyperlipidemia   . Carpal tunnel syndrome on both sides   . Numbness of foot     Left foot - back surgery 2009  . GERD (gastroesophageal reflux disease)   . Arthritis   . Chronic lower back pain   . Myocardial infarction [facility]) 1997, 2007, 2013  . Blood transfusion without reported diagnosis    Past Surgical History  Procedure Laterality Date  . Cesarean section  1990  . Ankle surgery  Years ago    Left; "had to take out a floater"  . Posterior fusion lumbar spine  1962; 2009  . Coronary angioplasty with stent placement  1997; 2007, 2013    "2 + 2; + 1= total of 5  . 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 Left heart catheterization with coronary angiogram N/A 05/18/2012    Procedure: LEFT HEART CATHETERIZATION WITH CORONARY ANGIOGRAM;  Surgeon: Wellington Hampshire, MD;  Location: [facility] CATH LAB;  Service: Cardiovascular;  Laterality: N/A;  . Carpal tunnel release Left 10/12016   Family History  Problem Relation Age of Onset  . Coronary artery  disease Mother   . Diabetes Mother   . Hypertension Mother   . Dementia Father   . Sudden death Brother     Suicide  . Cancer Paternal Grandfather     Esophageal   Social History  Substance Use Topics  . Smoking status: Current Every Day Smoker -- 0.50 packs/day for 50 years    Types: Cigarettes  . Smokeless tobacco: Never Used     Comment: Working on it at this time.//kg  . Alcohol Use: No   OB History    Gravida Para Term Preterm AB TAB SAB Ectopic Multiple Living   2 2        2      Review of Systems  All other systems reviewed and are negative.     Allergies  Oxycodone hcl; Penicillins; Aspirin; Macrobid; Sulfa antibiotics; and Tramadol  Home Medications   Prior to Admission  medications   Medication Sig Start Date End Date Taking? Authorizing Provider  buPROPion (WELLBUTRIN XL) 150 MG 24 hr tablet Take 1 tablet (150 mg total) by mouth daily. 01/30/16  Yes Eulas Post, MD  clopidogrel (PLAVIX) 75 MG tablet TAKE 1 TABLET BY MOUTH ONCE DAILY Patient taking differently: TAKE 75 MG  BY MOUTH ONCE DAILY 11/15/15  Yes Fay Records, MD  FLUoxetine (PROZAC) 20 MG tablet Take 1 tablet (20 mg total) by mouth daily. 01/02/16  Yes [name], NP  HYDROmorphone (DILAUDID) 2 MG tablet Take 2 mg by mouth every 8 (eight) hours as needed. for pain 10/22/15  Yes Historical Provider, MD  metoprolol (LOPRESSOR) 50 MG tablet TAKE 1/2 TABLETS (25 MG TOTAL) BY MOUTH 2 (TWO) TIMES DAILY. 06/11/15  Yes [name], NP  omeprazole (PRILOSEC) 40 MG capsule Take 40 mg by mouth daily.   Yes Historical Provider, MD  Probiotic Product (PROBIOTIC PO) Take 1 capsule by mouth daily.    Yes Historical Provider, MD  Red Yeast Rice Extract (RED YEAST RICE PO) Take 1 capsule by mouth daily.    Yes Historical Provider, MD  simvastatin (ZOCOR) 80 MG tablet TAKE 1 TABLET BY MOUTH AT BEDTIME Patient taking differently: TAKE 80 MG BY MOUTH AT BEDTIME 01/22/16  Yes [name], NP  terconazole (TERAZOL 7) 0.4 % vaginal cream Place 1 applicator vaginally at bedtime. One applicator full QHS for seven days of therapy 12/11/15  Yes Regina Eck, CNM  UNABLE TO FIND Take 1 capsule by mouth daily. OTC Eye vitamin   Yes Historical Provider, MD  UNABLE TO FIND Inject 1 mL into the skin every 14 (fourteen) days. b12 shots twice a month at MDs office   Yes Historical Provider, MD  zolpidem (AMBIEN) 5 MG tablet Take 1 tablet (5 mg total) by mouth at bedtime. 01/17/16  Yes Cory Nafziger, NP   BP 125/72 mmHg  Pulse 59  Temp(Src) 97.9 F (36.6 C) (Oral)  Resp 16  SpO2 97%  LMP  (LMP Unknown) Physical Exam  Constitutional: She appears well-developed and well-nourished. No distress.  HENT:  Head: Normocephalic  and atraumatic.  Neck: Neck supple.  Cardiovascular: Normal rate and regular rhythm.   Pulmonary/Chest: Effort normal and breath sounds normal. No respiratory distress. She has no wheezes. She has no rales.  Abdominal: Soft. She exhibits no distension. There is no tenderness. There is no rebound and no guarding.  Genitourinary: Uterus is not tender. Cervix exhibits no motion tenderness and no discharge. Right adnexum displays no mass, no tenderness and no fullness. Left adnexum displays  no mass, no tenderness and no fullness. There is tenderness in the vagina. No erythema or bleeding in the vagina. No foreign body around the vagina. No signs of injury around the vagina. No vaginal discharge found.  Diffuse tenderness throughout.   Neurological: She is alert.  Skin: She is not diaphoretic.  Nursing note and vitals reviewed.   ED Course  Procedures (including critical care time) Labs Review Labs Reviewed  WET PREP, GENITAL - Abnormal; Notable for the following:    WBC, Wet Prep HPF POC FEW (*)    All other components within normal limits  COMPREHENSIVE METABOLIC PANEL - Abnormal; Notable for the following:    Glucose, Bld 112 (*)    BUN <5 (*)    ALT 11 (*)    All other components within normal limits  CBC WITH DIFFERENTIAL/PLATELET - Abnormal; Notable for the following:    HCT 35.2 (*)    MCHC 36.6 (*)    All other components within normal limits  URINALYSIS, ROUTINE W REFLEX MICROSCOPIC (NOT AT [facility]) - Abnormal; Notable for the following:    Hgb urine dipstick TRACE (*)    All other components within normal limits  URINE MICROSCOPIC-ADD ON - Abnormal; Notable for the following:    Squamous Epithelial / LPF 0-5 (*)    All other components within normal limits  URINE CULTURE  LIPASE, BLOOD  GC/CHLAMYDIA PROBE AMP ([place]) NOT AT [facility]    Imaging Review No results found. I have personally reviewed and evaluated these images and lab results as part of my medical  decision-making.   EKG Interpretation   Date/Time:  Saturday February 09 2016 10:19:30 EDT Ventricular Rate:  61 PR Interval:    QRS Duration: 92 QT Interval:  429 QTC Calculation: 433 R Axis:   70 Text Interpretation:  Sinus rhythm Nonspecific T abnrm, anterolateral  leads Baseline wander in lead(s) II V1 V2 since last tracing no  significant change Confirmed by BELFI  MD, MELANIE ([id number]) on 02/09/2016  11:54:06 AM      MDM   Final diagnoses:  Depression  Chronic vaginitis  Urinary frequency    Afebrile nontoxic patient with c/o severe depression and anxiety, also with urinary frequency, vaginal burning, and ongoing chronic chest pain.  The urinary, vaginal, and chest symptoms seem to be chronic.  Pt also seen by Dr Tamera Punt.  Labs unremarkable.  Pelvic exam remarkable only for tenderness with exam.  UA negative.  She is medically clear.  Pending psych evaluation.  Signed out to Domenic Moras, PA-C, who assumes care at change of shift pending psychiatric MD decision.      Clayton Bibles, PA-C 02/09/16 [place], MD 02/10/16 [phone]

## 2016-02-09 NOTE — BH Assessment (Signed)
Tele Assessment Note   Bianca [name] is an 70 y.o. female. Pt denies SI/HI. Pt denies AVH. Per Pt she has been battling depression all of her life. Pt's states that her depression has been worsening since the beginning of this month. Pt could not identify triggers to her depression. Pt is not receiving outpatient treatment at this time. Pt has not received inpatient treatment. Pt states "I feel like I'm having a breakdown," "I'm about to loose it." Pt states she needs a change in medication. Pt is currently prescribed Wellbutrin and Prozac by her PA at her PCP office. Pt denies SA. Pt denies abuse. Pt is diabetic and had several heart attacks.   Writer consulted with Dr. Harrington Challenger. Per Dr. Harrington Challenger Pt will need to be seen by psychiatry to determine disposition.   Diagnosis:  F33.2 MDD, recurrent, severe  Past Medical History:  Past Medical History  Diagnosis Date  . CAD (coronary artery disease)     a. 1997 MI/PCI RCA;  b. 2007 MI/PCI of 100% RCA with Taxus DES x 3 placed, EF 55%;  c. 2010 Cath: stable anatomy;  d. 05/2012 NSTEMI/Cath/PCI: LM nl, LAD 60-18m, D1 20ost, LCX 58m, RCA 40-46m ISR, 60/95d (Treated w/ 2.75x33 Xience Xpedition DES), PDA 13m (Treated w/ PTCA), EF 60%.  . Depression   . Hyperlipidemia   . Carpal tunnel syndrome on both sides   . Numbness of foot     Left foot - back surgery 2009  . GERD (gastroesophageal reflux disease)   . Arthritis   . Chronic lower back pain   . Myocardial infarction [facility]) 1997, 2007, 2013  . Blood transfusion without reported diagnosis     Past Surgical History  Procedure Laterality Date  . Cesarean section  1990  . Ankle surgery  Years ago    Left; "had to take out a floater"  . Posterior fusion lumbar spine  1962; 2009  . Coronary angioplasty with stent placement  1997; 2007, 2013    "2 + 2; + 1= total of 5  . Total hip arthroplasty Right 05/24/2014    Procedure: RIGHT TOTAL HIP ARTHROPLASTY ANTERIOR APPROACH;  Surgeon: Gearlean Alf, MD;   Location: [facility];  Service: Orthopedics;  Laterality: Right;  . Left heart catheterization with coronary angiogram N/A 05/18/2012    Procedure: LEFT HEART CATHETERIZATION WITH CORONARY ANGIOGRAM;  Surgeon: Wellington Hampshire, MD;  Location: [facility] CATH LAB;  Service: Cardiovascular;  Laterality: N/A;  . Carpal tunnel release Left 10/12016    Family History:  Family History  Problem Relation Age of Onset  . Coronary artery disease Mother   . Diabetes Mother   . Hypertension Mother   . Dementia Father   . Sudden death Brother     Suicide  . Cancer Paternal Grandfather     Esophageal    Social History:  reports that she has been smoking Cigarettes.  She has a 25 pack-year smoking history. She has never used smokeless tobacco. She reports that she does not drink alcohol or use illicit drugs.  Additional Social History:  Alcohol / Drug Use Pain Medications: Pt denies  Prescriptions: Wellbutrin and Prozac Over the Counter: Pt denies History of alcohol / drug use?: No history of alcohol / drug abuse Longest period of sobriety (when/how long): NA  CIWA: CIWA-Ar BP: 124/72 mmHg Pulse Rate: 60 COWS:    PATIENT STRENGTHS: (choose at least two) Average or above average intelligence Communication skills  Allergies:  Allergies  Allergen Reactions  .  Oxycodone Hcl Itching and Other (See Comments)    confusion  . Penicillins Itching and Rash    Has patient had a PCN reaction causing immediate rash, facial/tongue/throat swelling, SOB or lightheadedness with hypotension: yes rash that took a while to go away across the abdomen Has patient had a PCN reaction causing severe rash involving mucus membranes or skin necrosis: no Has patient had a PCN reaction that required hospitalization: no Has patient had a PCN reaction occurring within the last 10 years: No If all of the above answers are "NO", then may proceed with Cephalosporin use.   . Aspirin     Burning in stomach  . Macrobid  [Nitrofurantoin Monohyd Macro] Nausea And Vomiting  . Sulfa Antibiotics Nausea Only  . Tramadol Itching    Seeing things    Home Medications:  (Not in a hospital admission)  OB/GYN Status:  No LMP recorded (lmp unknown). Patient is postmenopausal.  General Assessment Data [facility] of Assessment: [facility] TTS Assessment: In system Is this a Tele or Face-to-Face Assessment?: Tele Assessment Is this an Initial Assessment or a Re-assessment for this encounter?: Initial Assessment Marital status: Married Clara name: NA Is patient pregnant?: No Pregnancy Status: No Living Arrangements: Spouse/significant other Can pt return to current living arrangement?: Yes Admission Status: Voluntary Is patient capable of signing voluntary admission?: Yes Referral Source: Self/Family/Friend Insurance type: [place] Living Arrangements: Spouse/significant other Legal Guardian: Other: (self) Name of Psychiatrist: NA Name of Therapist: NA  Education Status Is patient currently in school?: Yes Current Grade: NA Highest grade of school patient has completed: some college Name of school: NA Contact person: NA  Risk to self with the past 6 months Suicidal Ideation: No Has patient been a risk to self within the past 6 months prior to admission? : No Suicidal Intent: No Has patient had any suicidal intent within the past 6 months prior to admission? : No Is patient at risk for suicide?: No Suicidal Plan?: No Has patient had any suicidal plan within the past 6 months prior to admission? : No Access to Means: No What has been your use of drugs/alcohol within the last 12 months?: Pt denies Previous Attempts/Gestures: No How many times?: 0 Other Self Harm Risks: NA Triggers for Past Attempts: None known Intentional Self Injurious Behavior: None Family Suicide History: No Recent stressful life event(s): Other (Comment) (Pt could not identify) Persecutory voices/beliefs?:  No Depression: Yes Depression Symptoms: Despondent, Tearfulness, Isolating, Guilt, Loss of interest in usual pleasures, Fatigue, Feeling worthless/self pity, Feeling angry/irritable Substance abuse history and/or treatment for substance abuse?: No Suicide prevention information given to non-admitted patients: Not applicable  Risk to Others within the past 6 months Homicidal Ideation: No Does patient have any lifetime risk of violence toward others beyond the six months prior to admission? : No Thoughts of Harm to Others: No Current Homicidal Intent: No Current Homicidal Plan: No Access to Homicidal Means: No Identified Victim: NA History of harm to others?: No Assessment of Violence: None Noted Violent Behavior Description: NA Does patient have access to weapons?: No Criminal Charges Pending?: No Does patient have a court date: No Is patient on probation?: No  Psychosis Hallucinations: None noted Delusions: None noted  Mental Status Report Appearance/Hygiene: Unremarkable, In hospital gown Eye Contact: Good Motor Activity: Freedom of movement Speech: Logical/coherent Level of Consciousness: Alert Mood: Depressed Affect: Depressed Anxiety Level: Minimal Thought Processes: Coherent, Relevant Judgement: Unimpaired Orientation: Person, Place, Time, Situation, Appropriate  for developmental age Obsessive Compulsive Thoughts/Behaviors: None  Cognitive Functioning Concentration: Normal Memory: Recent Intact, Remote Intact IQ: Average Insight: Fair Impulse Control: Fair Appetite: Fair Weight Loss: 0 Weight Gain: 0 Sleep: Decreased Total Hours of Sleep: 3 Vegetative Symptoms: None  ADLScreening [facility] Assessment Services) Patient's cognitive ability adequate to safely complete daily activities?: Yes Patient able to express need for assistance with ADLs?: Yes Independently performs ADLs?: Yes (appropriate for developmental age)  Prior Inpatient Therapy Prior Inpatient  Therapy: No Prior Therapy Dates: NA Prior Therapy Facilty/Provider(s): NA Reason for Treatment: NA  Prior Outpatient Therapy Prior Outpatient Therapy: No Prior Therapy Dates: Na Prior Therapy Facilty/Provider(s): nA Reason for Treatment: nA Does patient have an ACCT team?: No Does patient have Intensive In-House Services?  : No Does patient have [facility] services? : No Does patient have P4CC services?: No  ADL Screening (condition at time of admission) Patient's cognitive ability adequate to safely complete daily activities?: Yes Is the patient deaf or have difficulty hearing?: No Does the patient have difficulty seeing, even when wearing glasses/contacts?: No Does the patient have difficulty concentrating, remembering, or making decisions?: No Patient able to express need for assistance with ADLs?: Yes Does the patient have difficulty dressing or bathing?: No Independently performs ADLs?: Yes (appropriate for developmental age) Does the patient have difficulty walking or climbing stairs?: No Weakness of Legs: None Weakness of Arms/Hands: None       Abuse/Neglect Assessment (Assessment to be complete while patient is alone) Physical Abuse: Denies Verbal Abuse: Denies Sexual Abuse: Denies Exploitation of patient/patient's resources: Denies Self-Neglect: Denies Values / Beliefs Cultural Requests During Hospitalization: None Spiritual Requests During Hospitalization: None   Advance Directives (For Healthcare) Does patient have an advance directive?: No Would patient like information on creating an advanced directive?: No - patient declined information    Additional Information 1:1 In Past 12 Months?: No CIRT Risk: No Elopement Risk: No Does patient have medical clearance?: Yes     Disposition:  Disposition Initial Assessment Completed for this Encounter: Yes Disposition of Patient: Other dispositions (Per Dr. Harrington Challenger Pt needs to be seen by psychiatry) Other  disposition(s): Other (Comment) (To be seen by psychiatry)  Lorenza Cambridge D 02/09/2016 12:23 PM

## 2016-02-10 LAB — URINE CULTURE

## 2016-02-10 MED ORDER — CLONAZEPAM 0.5 MG PO TABS: 0.5000 mg | ORAL_TABLET | Freq: Two times a day (BID) | ORAL | Status: DC | PRN

## 2016-02-10 MED ORDER — FLUOXETINE HCL 20 MG PO CAPS: 30.0000 mg | ORAL_CAPSULE | Freq: Every day | ORAL | Status: DC

## 2016-02-10 MED ORDER — PANTOPRAZOLE SODIUM 40 MG PO TBEC
40.0000 mg | DELAYED_RELEASE_TABLET | Freq: Every day | ORAL | Status: DC
  Administered 2016-02-10: 40 mg via ORAL
  Filled 2016-02-10: qty 1

## 2016-02-10 MED ORDER — FLUOXETINE HCL 10 MG PO CAPS: 30.0000 mg | ORAL_CAPSULE | Freq: Every day | ORAL | Status: DC

## 2016-02-10 NOTE — Consult Note (Signed)
[place] Psychiatry Consult   Reason for Consult:  depression Referring Physician:  EDP Patient Identification: Bianca [name] MRN:  [id number] Principal Diagnosis: <principal problem not specified> Diagnosis:   Patient Active Problem List   Diagnosis Date Noted  . Hyponatremia [E87.1] 01/27/2016  . Bradycardia [R00.1] 01/27/2016  . B12 deficiency [E53.8] 07/11/2015  . Anemia associated with acute blood loss [D62] 05/28/2014  . Tobacco use [Z72.0] 05/28/2014  . OA (osteoarthritis) of hip [M16.9] 05/24/2014  . Chest pain [R07.9] 12/19/2013  . NSTEMI (non-ST elevated myocardial infarction) [I21.4] 05/18/2012  . Allergic rhinitis due to pollen [J30.1] 04/01/2012  . Back pain [M54.9] 11/22/2010  . NEOPLASM OF UNCERTAIN BEHAVIOR OF SKIN [D48.5] 06/05/2010  . GERD [K21.9] 07/30/2009  . Hyperlipidemia [E78.5] 03/02/2007  . Depression [F32.9] 03/02/2007  . Coronary atherosclerosis [I25.10] 03/02/2007    Total Time spent with patient: 30 minutes  Subjective:   Bianca [name] is a 70 y.o. female patient admitted with depression.  HPI:  Pt is 70 year old married female who lives with her husband. 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 The patient states her depression started about 20 years ago when she was going through menopause. She took Prozac with good result. She went off it after a time and a few months ago became depressed again a few months ago. She was placed on Lexapro but depression worsened. Earlier this month she was placed on Prozac 20 mg and a couple of weeks ago Wellbutril XL 150 was added.   On day of admission she felt upset , overwhelmed and unable to do anything. She is very anxious. She has no medication for anxiety. She denies SI/HI or AH/VH. She has to take Ambien 5 mg for sleep.She takes care of 5 yo grandson. Pt's meds have been managed by PCP but she has out patient arranged with psychiatrist.  Past Psychiatric History: out pt treatment with PCP only  Risk to  Self: Suicidal Ideation: No Suicidal Intent: No Is patient at risk for suicide?: No Suicidal Plan?: No Access to Means: No What has been your use of drugs/alcohol within the last 12 months?: Pt denies How many times?: 0 Other Self Harm Risks: NA Triggers for Past Attempts: None known Intentional Self Injurious Behavior: None Risk to Others: Homicidal Ideation: No Thoughts of Harm to Others: No Current Homicidal Intent: No Current Homicidal Plan: No Access to Homicidal Means: No Identified Victim: NA History of harm to others?: No Assessment of Violence: None Noted Violent Behavior Description: NA Does patient have access to weapons?: No Criminal Charges Pending?: No Does patient have a court date: No Prior Inpatient Therapy: Prior Inpatient Therapy: No Prior Therapy Dates: NA Prior Therapy Facilty/Provider(s): NA Reason for Treatment: NA Prior Outpatient Therapy: Prior Outpatient Therapy: No Prior Therapy Dates: Na Prior Therapy Facilty/Provider(s): nA Reason for Treatment: nA Does patient have an ACCT team?: No Does patient have Intensive In-House Services?  : No Does patient have [facility] services? : No Does patient have P4CC services?: No  Past Medical History:  Past Medical History  Diagnosis Date  . CAD (coronary artery disease)     a. 1997 MI/PCI RCA;  b. 2007 MI/PCI of 100% RCA with Taxus DES x 3 placed, EF 55%;  c. 2010 Cath: stable anatomy;  d. 05/2012 NSTEMI/Cath/PCI: LM nl, LAD 60-79m D1 20ost, LCX 528mRCA 40-5079mR, 60/95d (Treated w/ 2.75x33 Xience Xpedition DES), PDA 38m58meated w/ PTCA), EF 60%.  . Depression   . Hyperlipidemia   .  Carpal tunnel syndrome on both sides   . Numbness of foot     Left foot - back surgery 2009  . GERD (gastroesophageal reflux disease)   . Arthritis   . Chronic lower back pain   . Myocardial infarction [facility]) 1997, 2007, 2013  . Blood transfusion without reported diagnosis     Past Surgical History  Procedure Laterality  Date  . Cesarean section  1990  . Ankle surgery  Years ago    Left; "had to take out a floater"  . Posterior fusion lumbar spine  1962; 2009  . Coronary angioplasty with stent placement  1997; 2007, 2013    "2 + 2; + 1= total of 5  . 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 Left heart catheterization with coronary angiogram N/A 05/18/2012    Procedure: LEFT HEART CATHETERIZATION WITH CORONARY ANGIOGRAM;  Surgeon: Wellington Hampshire, MD;  Location: [facility] CATH LAB;  Service: Cardiovascular;  Laterality: N/A;  . Carpal tunnel release Left 10/12016   Family History:  Family History  Problem Relation Age of Onset  . Coronary artery disease Mother   . Diabetes Mother   . Hypertension Mother   . Dementia Father   . Sudden death Brother     Suicide  . Cancer Paternal Grandfather     Esophageal   Family Psychiatric  History: none Social History:  History  Alcohol Use No     History  Drug Use No    Social History   Social History  . Marital Status: Married    Spouse Name: N/A  . Number of Children: N/A  . Years of Education: N/A   Social History Main Topics  . Smoking status: Current Every Day Smoker -- 0.50 packs/day for 50 years    Types: Cigarettes  . Smokeless tobacco: Never Used     Comment: Working on it at this time.//kg  . Alcohol Use: No  . Drug Use: No  . Sexual Activity: Not Currently    Birth Control/ Protection: Post-menopausal   Other Topics Concern  . None   Social History Narrative   Is a homemaker   Married for 5 years    Has a daughter and son    Pets: Two dogs and a Neurosurgeon   Likes to garden ( flower beds).          Additional Social History:    Allergies:   Allergies  Allergen Reactions  . Oxycodone Hcl Itching and Other (See Comments)    confusion  . Penicillins Itching and Rash    Has  patient had a PCN reaction causing immediate rash, facial/tongue/throat swelling, SOB or lightheadedness with hypotension: yes rash that took a while to go away across the abdomen Has patient had a PCN reaction causing severe rash involving mucus membranes or skin necrosis: no Has patient had a PCN reaction that required hospitalization: no Has patient had a PCN reaction occurring within the last 10 years: No If all of the above answers are "NO", then may proceed with Cephalosporin use.   . Aspirin     Burning in stomach  . Macrobid [Nitrofurantoin Monohyd Macro] Nausea And Vomiting  . Sulfa Antibiotics Nausea Only  . Tramadol Itching    Seeing things    Labs:  Results for orders placed or performed during the hospital encounter of  02/09/16 (from the past 48 hour(s))  Comprehensive metabolic panel     Status: Abnormal   Collection Time: 02/09/16 10:42 AM  Result Value Ref Range   Sodium 135 135 - 145 mmol/L   Potassium 3.7 3.5 - 5.1 mmol/L   Chloride 102 101 - 111 mmol/L   CO2 24 22 - 32 mmol/L   Glucose, Bld 112 (H) 65 - 99 mg/dL   BUN <5 (L) 6 - 20 mg/dL   Creatinine, Ser 0.69 0.44 - 1.00 mg/dL   Calcium 9.4 8.9 - 10.3 mg/dL   Total Protein 7.3 6.5 - 8.1 g/dL   Albumin 3.6 3.5 - 5.0 g/dL   AST 22 15 - 41 U/L   ALT 11 (L) 14 - 54 U/L   Alkaline Phosphatase 90 38 - 126 U/L   Total Bilirubin 0.9 0.3 - 1.2 mg/dL   GFR calc non Af Amer >60 >60 mL/min   GFR calc Af Amer >60 >60 mL/min    Comment: (NOTE) The eGFR has been calculated using the CKD EPI equation. This calculation has not been validated in all clinical situations. eGFR's persistently <60 mL/min signify possible Chronic Kidney Disease.    Anion gap 9 5 - 15  CBC with Differential     Status: Abnormal   Collection Time: 02/09/16 10:42 AM  Result Value Ref Range   WBC 7.2 4.0 - 10.5 K/uL   RBC 4.12 3.87 - 5.11 MIL/uL   Hemoglobin 12.9 12.0 - 15.0 g/dL   HCT 35.2 (L) 36.0 - 46.0 %   MCV 85.4 78.0 - 100.0 fL   MCH  31.3 26.0 - 34.0 pg   MCHC 36.6 (H) 30.0 - 36.0 g/dL   RDW 11.9 11.5 - 15.5 %   Platelets 354 150 - 400 K/uL   Neutrophils Relative % 74 %   Neutro Abs 5.3 1.7 - 7.7 K/uL   Lymphocytes Relative 17 %   Lymphs Abs 1.2 0.7 - 4.0 K/uL   Monocytes Relative 9 %   Monocytes Absolute 0.6 0.1 - 1.0 K/uL   Eosinophils Relative 0 %   Eosinophils Absolute 0.0 0.0 - 0.7 K/uL   Basophils Relative 0 %   Basophils Absolute 0.0 0.0 - 0.1 K/uL  Lipase, blood     Status: None   Collection Time: 02/09/16 10:42 AM  Result Value Ref Range   Lipase 20 11 - 51 U/L  Urinalysis, Routine w reflex microscopic     Status: Abnormal   Collection Time: 02/09/16 12:28 PM  Result Value Ref Range   Color, Urine YELLOW YELLOW   APPearance CLEAR CLEAR   Specific Gravity, Urine 1.005 1.005 - 1.030   pH 6.5 5.0 - 8.0   Glucose, UA NEGATIVE NEGATIVE mg/dL   Hgb urine dipstick TRACE (A) NEGATIVE   Bilirubin Urine NEGATIVE NEGATIVE   Ketones, ur NEGATIVE NEGATIVE mg/dL   Protein, ur NEGATIVE NEGATIVE mg/dL   Nitrite NEGATIVE NEGATIVE   Leukocytes, UA NEGATIVE NEGATIVE  Urine culture     Status: Abnormal   Collection Time: 02/09/16 12:28 PM  Result Value Ref Range   Specimen Description URINE, CLEAN CATCH    Special Requests NONE    Culture (A)     2,000 COLONIES/mL INSIGNIFICANT GROWTH Performed at [facility]    Report Status 02/10/2016 FINAL   Urine microscopic-add on     Status: Abnormal   Collection Time: 02/09/16 12:28 PM  Result Value Ref Range   Squamous Epithelial / LPF 0-5 (A) NONE  SEEN   WBC, UA NONE SEEN 0 - 5 WBC/hpf   RBC / HPF 0-5 0 - 5 RBC/hpf   Bacteria, UA NONE SEEN NONE SEEN  Wet prep, genital     Status: Abnormal   Collection Time: 02/09/16  1:06 PM  Result Value Ref Range   Yeast Wet Prep HPF POC NONE SEEN NONE SEEN   Trich, Wet Prep NONE SEEN NONE SEEN   Clue Cells Wet Prep HPF POC NONE SEEN NONE SEEN   WBC, Wet Prep HPF POC FEW (A) NONE SEEN   Sperm NONE SEEN      Current Facility-Administered Medications  Medication Dose Route Frequency Provider Last Rate Last Dose  . alum & mag hydroxide-simeth (MAALOX/MYLANTA) 200-200-20 MG/5ML suspension 30 mL  30 mL Oral PRN Domenic Moras, PA-C   30 mL at 02/10/16 1104  . buPROPion (WELLBUTRIN XL) 24 hr tablet 150 mg  150 mg Oral Daily Domenic Moras, PA-C   150 mg at 02/09/16 2239  . clopidogrel (PLAVIX) tablet 75 mg  75 mg Oral Daily Domenic Moras, PA-C   75 mg at 02/09/16 2240  . FLUoxetine (PROZAC) capsule 20 mg  20 mg Oral Daily Domenic Moras, PA-C   20 mg at 02/10/16 1029  . HYDROmorphone (DILAUDID) tablet 2 mg  2 mg Oral Q8H PRN Domenic Moras, PA-C   2 mg at 02/10/16 1105  . LORazepam (ATIVAN) tablet 1 mg  1 mg Oral Q8H PRN Domenic Moras, PA-C      . metoprolol tartrate (LOPRESSOR) tablet 12.5 mg  12.5 mg Oral BID Domenic Moras, PA-C   12.5 mg at 02/10/16 1029  . miconazole (MONISTAT 7) 2 % vaginal cream 1 Applicatorful  1 Applicatorful Vaginal QHS Domenic Moras, PA-C   1 Applicatorful at 43/56/86 2333  . nicotine (NICODERM CQ - dosed in mg/24 hours) patch 21 mg  21 mg Transdermal Daily Domenic Moras, PA-C   21 mg at 02/10/16 0001  . ondansetron (ZOFRAN) tablet 4 mg  4 mg Oral Q8H PRN Domenic Moras, PA-C      . pantoprazole (PROTONIX) EC tablet 40 mg  40 mg Oral Daily Cloria Spring, MD   40 mg at 02/10/16 1105  . zolpidem (AMBIEN) tablet 5 mg  5 mg Oral QHS PRN Domenic Moras, PA-C   5 mg at 02/10/16 0008   Current Outpatient Prescriptions  Medication Sig Dispense Refill  . buPROPion (WELLBUTRIN XL) 150 MG 24 hr tablet Take 1 tablet (150 mg total) by mouth daily. 30 tablet 3  . clopidogrel (PLAVIX) 75 MG tablet TAKE 1 TABLET BY MOUTH ONCE DAILY (Patient taking differently: TAKE 75 MG  BY MOUTH ONCE DAILY) 30 tablet 3  . FLUoxetine (PROZAC) 20 MG tablet Take 1 tablet (20 mg total) by mouth daily. 30 tablet 3  . HYDROmorphone (DILAUDID) 2 MG tablet Take 2 mg by mouth every 8 (eight) hours as needed. for pain  0  . metoprolol (LOPRESSOR) 50  MG tablet TAKE 1/2 TABLETS (25 MG TOTAL) BY MOUTH 2 (TWO) TIMES DAILY. 90 tablet 1  . omeprazole (PRILOSEC) 40 MG capsule Take 40 mg by mouth daily.    . Probiotic Product (PROBIOTIC PO) Take 1 capsule by mouth daily.     . Red Yeast Rice Extract (RED YEAST RICE PO) Take 1 capsule by mouth daily.     . simvastatin (ZOCOR) 80 MG tablet TAKE 1 TABLET BY MOUTH AT BEDTIME (Patient taking differently: TAKE 80 MG BY MOUTH AT BEDTIME) 30 tablet  4  . terconazole (TERAZOL 7) 0.4 % vaginal cream Place 1 applicator vaginally at bedtime. One applicator full QHS for seven days of therapy 45 g 0  . UNABLE TO FIND Take 1 capsule by mouth daily. OTC Eye vitamin    . UNABLE TO FIND Inject 1 mL into the skin every 14 (fourteen) days. b12 shots twice a month at MDs office    . zolpidem (AMBIEN) 5 MG tablet Take 1 tablet (5 mg total) by mouth at bedtime. 30 tablet 0    Musculoskeletal: Strength & Muscle Tone: within normal limits Gait & Station: normal Patient leans: N/A  Psychiatric Specialty Exam: Physical Exam  Review of Systems  Genitourinary: Positive for dysuria.  Psychiatric/Behavioral: Positive for depression. The patient is nervous/anxious.   All other systems reviewed and are negative.   Blood pressure 139/80, pulse 58, temperature 98.1 F (36.7 C), temperature source Oral, resp. rate 16, SpO2 98 %.There is no weight on file to calculate BMI.  General Appearance: Casual, Neat and Well Groomed  Eye Contact:  Good  Speech:  Clear and Coherent  Volume:  Normal  Mood:  Anxious and Depressed  Affect:  Congruent  Thought Process:  Goal Directed  Orientation:  Full (Time, Place, and Person)  Thought Content:  Rumination  Suicidal Thoughts:  No  Homicidal Thoughts:  No  Memory:  Immediate;   Good Recent;   Fair Remote;   Fair  Judgement:  Fair  Insight:  Fair  Psychomotor Activity:  Normal  Concentration:  Concentration: Good and Attention Span: Good  Recall:  Good  Fund of Knowledge:  Good   Language:  Good  Akathisia:  No  Handed:  Right  AIMS (if indicated):     Assets:  Communication Skills Desire for Improvement Resilience Social Support  ADL's:  Intact  Cognition:  WNL  Sleep:        Treatment Plan Summary: Daily contact with patient to assess and evaluate symptoms and progress in treatment  Disposition: No evidence of imminent risk to self or others at present.   Patient does not meet criteria for psychiatric inpatient admission. Supportive therapy provided about ongoing stressors. Discussed crisis plan, support from social network, calling 911, coming to the Emergency Department, and calling Suicide Hotline.   Suggest increasing Prozac to 30 mg and d/c Wellbutrin as it may be worsening anxiety. Clonazepam prn for anxiety Patient is safe to d/c home with outpt follow up  Levonne Spiller, MD 02/10/2016 1:13 PM

## 2016-02-10 NOTE — ED Notes (Signed)
Pt discharged ambulatory with husband.  Discharge instructions and RX reviewed.  All belongings were sent home with husband earlier except a new clean outfit which was given to patient to change into.  Pt was in no distress.

## 2016-02-10 NOTE — BHH Suicide Risk Assessment (Signed)
[facility] Discharge Suicide Risk Assessment   Principal Problem: <principal problem not specified> Discharge Diagnoses:  Patient Active Problem List   Diagnosis Date Noted  . Hyponatremia [E87.1] 01/27/2016  . Bradycardia [R00.1] 01/27/2016  . B12 deficiency [E53.8] 07/11/2015  . Anemia associated with acute blood loss [D62] 05/28/2014  . Tobacco use [Z72.0] 05/28/2014  . OA (osteoarthritis) of hip [M16.9] 05/24/2014  . Chest pain [R07.9] 12/19/2013  . NSTEMI (non-ST elevated myocardial infarction) [I21.4] 05/18/2012  . Allergic rhinitis due to pollen [J30.1] 04/01/2012  . Back pain [M54.9] 11/22/2010  . NEOPLASM OF UNCERTAIN BEHAVIOR OF SKIN [D48.5] 06/05/2010  . GERD [K21.9] 07/30/2009  . Hyperlipidemia [E78.5] 03/02/2007  . Depression [F32.9] 03/02/2007  . Coronary atherosclerosis [I25.10] 03/02/2007    Total Time spent with patient: 30 minutes  Musculoskeletal: Strength & Muscle Tone: within normal limits Gait & Station: normal Patient leans: N/A  Psychiatric Specialty Exam: Review of Systems  Genitourinary: Positive for dysuria.  Psychiatric/Behavioral: Positive for depression. The patient is nervous/anxious.     Blood pressure 139/80, pulse 58, temperature 98.1 F (36.7 C), temperature source Oral, resp. rate 16, SpO2 98 %.There is no weight on file to calculate BMI.  General Appearance: Casual, Neat and Well Groomed  Eye Contact::  Good  Speech:  Clear and [id number]  Volume:  Normal  Mood:  Anxious and Depressed  Affect:  Congruent  Thought Process:  Goal Directed  Orientation:  Full (Time, Place, and Person)  Thought Content:  Rumination  Suicidal Thoughts:  No  Homicidal Thoughts:  No  Memory:  Immediate;   Good Recent;   Fair Remote;   Fair  Judgement:  Good  Insight:  Fair  Psychomotor Activity:  Normal  Concentration:  Good  Recall:  Good  Fund of Knowledge:Good  Language: Good  Akathisia:  No  Handed:  Right  AIMS (if indicated):     Assets:   Communication Skills Desire for Improvement Resilience Social Support  Sleep:     Cognition: WNL  ADL's:  Intact   Mental Status Per Nursing Assessment::   On Admission:     Demographic Factors:  Age 70 or older  Loss Factors: NA  Historical Factors: NA  Risk Reduction Factors:   Sense of responsibility to family, Living with another person, especially a relative and Positive social support  Continued Clinical Symptoms:  Depression:   Insomnia  Cognitive Features That Contribute To Risk:  None    Suicide Risk:  Minimal: No identifiable suicidal ideation.  Patients presenting with no risk factors but with morbid ruminations; may be classified as minimal risk based on the severity of the depressive symptoms  Follow-up Information    Schedule an appointment as soon as possible for a visit with [name], NP.   Specialty:  Family Medicine   Contact information:   [address] [place] [address] [phone]       Schedule an appointment as soon as possible for a visit with Your gynecologist .      Plan Of Care/Follow-up recommendations:  Activity:  normal Diet:  normal Other:  follow up with psychiatrist as arranged by family  Levonne Spiller, MD 02/10/2016, 1:34 PM

## 2016-02-10 NOTE — ED Notes (Signed)
Patient noted in room. Pt denies SI, HI, A/ V H, anxiety, pt endorses pain and depression. Pt very pleasant as plan discussed with pt. Q15 minute rounds and monitoring via security cameras continue for safety.

## 2016-02-11 ENCOUNTER — Telehealth: Payer: Self-pay | Admitting: Certified Nurse Midwife

## 2016-02-11 LAB — GC/CHLAMYDIA PROBE AMP (~~LOC~~) NOT AT ARMC
Chlamydia: NEGATIVE
Neisseria Gonorrhea: NEGATIVE

## 2016-02-11 NOTE — Telephone Encounter (Signed)
Patient's daughter Theadora Rama (dpr for on file) calling to speak with nurse because mom is having vaginal burning and in a lot of pain. [phone]

## 2016-02-11 NOTE — Telephone Encounter (Signed)
Pt schedule with Tommi Rumps.

## 2016-02-11 NOTE — Telephone Encounter (Signed)
Spoke with patient's daughter Bianca [name], okay per ROI. Bianca [name] states that the patient has been experiencing increased vaginal burning and pain over the last few months. Reports she has seen multiple doctors and has tried vaginal ointments and cream without relief. "She was seen in the hospital over the weekend for this and had a lot of testing for yeast, bacteria, and fungus. Everything was normal." States she was given pain medication for discomfort which helps to relieve the pain temporarily. "She says it is like she has sand paper to her vagina constantly." Advised the patient will need to be seen in the office for further evaluation. Declines appointment for today. Appointment scheduled for 02/13/2016 at 1 pm with Dr.Silva. She is agreeable to date and time. Bianca [name] would also like Dr.Silva to know that the patient has had black mold removed from under her house recently and is unsure if this could be causing her health problems. Advised I will let Dr.Silva know and this can be discussed at the patient's office visit. She is agreeable.  Routing to provider for final review. Patient agreeable to disposition. Will close encounter.

## 2016-02-12 ENCOUNTER — Telehealth: Payer: Self-pay | Admitting: Adult Health

## 2016-02-12 DIAGNOSIS — G4700 Insomnia, unspecified: Secondary | ICD-10-CM

## 2016-02-12 MED ORDER — ZOLPIDEM TARTRATE 5 MG PO TABS: 5.0000 mg | ORAL_TABLET | Freq: Every day | ORAL | Status: DC

## 2016-02-12 NOTE — Telephone Encounter (Signed)
Faxed to pharmacy

## 2016-02-12 NOTE — Telephone Encounter (Signed)
Pt need new Rx for Ambien    Pharm:  [facility]

## 2016-02-12 NOTE — Telephone Encounter (Signed)
Rx printed to be signed & faxed

## 2016-02-12 NOTE — Telephone Encounter (Signed)
Ok to refill for one month  

## 2016-02-13 ENCOUNTER — Ambulatory Visit: Payer: Medicare Other | Admitting: Adult Health

## 2016-02-13 ENCOUNTER — Ambulatory Visit (INDEPENDENT_AMBULATORY_CARE_PROVIDER_SITE_OTHER): Payer: Managed Care, Other (non HMO) | Admitting: Obstetrics and Gynecology

## 2016-02-13 ENCOUNTER — Other Ambulatory Visit: Payer: Self-pay | Admitting: Adult Health

## 2016-02-13 ENCOUNTER — Encounter: Payer: Self-pay | Admitting: Obstetrics and Gynecology

## 2016-02-13 VITALS — BP 122/64 | HR 70 | Ht 61.0 in | Wt 163.0 lb

## 2016-02-13 DIAGNOSIS — N760 Acute vaginitis: Secondary | ICD-10-CM | POA: Diagnosis not present

## 2016-02-13 MED ORDER — FLUCONAZOLE 150 MG PO TABS: 150.0000 mg | ORAL_TABLET | Freq: Once | ORAL | Status: DC

## 2016-02-13 NOTE — Progress Notes (Signed)
Patient ID: Bianca [name], female   DOB: [birth year], 70 y.o.   MRN: [id number] GYNECOLOGY  VISIT   HPI: 70 y.o.   Married  Caucasian  female   G2P2 with No LMP recorded (lmp unknown). Patient is postmenopausal.   here for evaluation of vaginal burning.  Patient was seen at [facility] 02-09-16 for these symptoms and advised she had a "yeast infection" and treated with Monistat.  This did not help. States Monistat made it burn.   Has intense internal vaginal burning.  Coming and going over 2 months.  No vaginal discharge or odor.  Took an abx about one month ago.  Tx with erythromycinmfor sinusitis and then ciprofloxacin for UTI.  Used coconut oil and this did not make anything better or worse.  No new detergent. Using a new pad due to urinary leakage.  No new detergents or soaps.  No new sexual partner. Not sexually active right now.  Now taking Dilaudid for back pain.    Patient would like to make doctor aware her house has been found to have mold underneath.  GYNECOLOGIC HISTORY: No LMP recorded (lmp unknown). Patient is postmenopausal. Contraception:  Postmenopausal Menopausal hormone therapy:  None Last mammogram:  01-24-15 negative per patient Last pap smear:   09-11-15 Neg        OB History    Gravida Para Term Preterm AB TAB SAB Ectopic Multiple Living   2 2        2          Patient Active Problem List   Diagnosis Date Noted  . Hyponatremia 01/27/2016  . Bradycardia 01/27/2016  . B12 deficiency 07/11/2015  . Anemia associated with acute blood loss 05/28/2014  . Tobacco use 05/28/2014  . OA (osteoarthritis) of hip 05/24/2014  . Chest pain 12/19/2013  . NSTEMI (non-ST elevated myocardial infarction) 05/18/2012  . Allergic rhinitis due to pollen 04/01/2012  . Back pain 11/22/2010  . NEOPLASM OF UNCERTAIN BEHAVIOR OF SKIN 06/05/2010  . GERD 07/30/2009  . Hyperlipidemia 03/02/2007  . Depression 03/02/2007  . Coronary atherosclerosis 03/02/2007    Past  Medical History  Diagnosis Date  . CAD (coronary artery disease)     a. 1997 MI/PCI RCA;  b. 2007 MI/PCI of 100% RCA with Taxus DES x 3 placed, EF 55%;  c. 2010 Cath: stable anatomy;  d. 05/2012 NSTEMI/Cath/PCI: LM nl, LAD 60-106m, D1 20ost, LCX 68m, RCA 40-37m ISR, 60/95d (Treated w/ 2.75x33 Xience Xpedition DES), PDA 77m (Treated w/ PTCA), EF 60%.  . Depression   . Hyperlipidemia   . Carpal tunnel syndrome on both sides   . Numbness of foot     Left foot - back surgery 2009  . GERD (gastroesophageal reflux disease)   . Arthritis   . Chronic lower back pain   . Myocardial infarction [facility]) 1997, 2007, 2013  . Blood transfusion without reported diagnosis     Past Surgical History  Procedure Laterality Date  . Cesarean section  1990  . Ankle surgery  Years ago    Left; "had to take out a floater"  . Posterior fusion lumbar spine  1962; 2009  . Coronary angioplasty with stent placement  1997; 2007, 2013    "2 + 2; + 1= total of 5  . 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 Left heart  catheterization with coronary angiogram N/A 05/18/2012    Procedure: LEFT HEART CATHETERIZATION WITH CORONARY ANGIOGRAM;  Surgeon: Wellington Hampshire, MD;  Location: [place] CATH LAB;  Service: Cardiovascular;  Laterality: N/A;  . Carpal tunnel release Left 10/12016    Current Outpatient Prescriptions  Medication Sig Dispense Refill  . clonazePAM (KLONOPIN) 0.5 MG tablet Take 1 tablet (0.5 mg total) by mouth 2 (two) times daily as needed (anxiety). 10 tablet 0  . clopidogrel (PLAVIX) 75 MG tablet TAKE 1 TABLET BY MOUTH ONCE DAILY (Patient taking differently: TAKE 75 MG  BY MOUTH ONCE DAILY) 30 tablet 3  . FLUoxetine (PROZAC) 10 MG capsule Take 3 capsules (30 mg total) by mouth daily. 30 capsule 3  . HYDROmorphone (DILAUDID) 2 MG tablet Take 2 mg by mouth every  8 (eight) hours as needed. for pain  0  . metoprolol (LOPRESSOR) 50 MG tablet TAKE 1/2 TABLETS (25 MG TOTAL) BY MOUTH 2 (TWO) TIMES DAILY. 90 tablet 1  . omeprazole (PRILOSEC) 40 MG capsule Take 40 mg by mouth daily.    . Probiotic Product (PROBIOTIC PO) Take 1 capsule by mouth daily.     . Red Yeast Rice Extract (RED YEAST RICE PO) Take 1 capsule by mouth daily.     . simvastatin (ZOCOR) 80 MG tablet TAKE 1 TABLET BY MOUTH AT BEDTIME (Patient taking differently: TAKE 80 MG BY MOUTH AT BEDTIME) 30 tablet 4  . UNABLE TO FIND Take 1 capsule by mouth daily. OTC Eye vitamin    . UNABLE TO FIND Inject 1 mL into the skin every 14 (fourteen) days. b12 shots twice a month at MDs office    . zolpidem (AMBIEN) 5 MG tablet Take 1 tablet (5 mg total) by mouth at bedtime. 30 tablet 0   No current facility-administered medications for this visit.     ALLERGIES: Oxycodone hcl; Penicillins; Aspirin; Macrobid; Sulfa antibiotics; and Tramadol  Family History  Problem Relation Age of Onset  . Coronary artery disease Mother   . Diabetes Mother   . Hypertension Mother   . Dementia Father   . Sudden death Brother     Suicide  . Cancer Paternal Grandfather     Esophageal    Social History   Social History  . Marital Status: Married    Spouse Name: N/A  . Number of Children: N/A  . Years of Education: N/A   Occupational History  . Not on file.   Social History Main Topics  . Smoking status: Light Tobacco Smoker -- 0.50 packs/day for 50 years    Types: Cigarettes  . Smokeless tobacco: Never Used     Comment: 02/13/16 trying to quit--has't smoked x1week Working on it at this time.//kg  . Alcohol Use: No  . Drug Use: No  . Sexual Activity: Not Currently    Birth Control/ Protection: Post-menopausal   Other Topics Concern  . Not on file   Social History Narrative   Is a homemaker   Married for 30 years    Has a daughter and son    Pets: Two dogs and a Neurosurgeon   Likes to garden ( flower beds).            ROS:  Pertinent items are noted in HPI.  PHYSICAL EXAMINATION:    BP 122/64 mmHg  Pulse 70  Ht 5\' 1"  (1.549 m)  Wt 163 lb (73.936 kg)  BMI 30.81 kg/m2  LMP  (LMP Unknown)    General appearance: alert, cooperative  and appears stated age    Pelvic: External genitalia:  no lesions              Urethra:  normal appearing urethra with no masses, tenderness or lesions              Bartholins and Skenes: normal                 Vagina: normal appearing vagina with normal color and discharge, no lesions              Cervix: no lesions            Bimanual Exam:  Uterus:  normal size, contour, position, consistency, mobility, non-tender              Adnexa: normal adnexa and no mass, fullness, tenderness              Rectal exam: Yes.  .  Confirms.              Anus:  normal sphincter tone, no lesions  Chaperone was present for exam.  ASSESSMENT  Vulvitis.  Hx MI.  Depression. Status post recent abx.  PLAN  Affirm done.  Probiotics discussed.  Diflucan 150 mg po.  May repeat in 72 hours if needed.  Avoid pads or switch to a new one.  Follow up for recurrent or persistent symptoms.    An After Visit Summary was printed and given to the patient.  _15_____ minutes face to face time of which over 50% was spent in counseling.

## 2016-02-14 ENCOUNTER — Encounter (HOSPITAL_COMMUNITY): Payer: Medicare Other

## 2016-02-14 ENCOUNTER — Telehealth: Payer: Self-pay | Admitting: Obstetrics and Gynecology

## 2016-02-14 LAB — WET PREP BY MOLECULAR PROBE
CANDIDA SPECIES: NEGATIVE
Gardnerella vaginalis: NEGATIVE
Trichomonas vaginosis: NEGATIVE

## 2016-02-14 MED ORDER — HYLAFEM VA SUPP: 1.0000 | Freq: Every day | VAGINAL | Status: DC

## 2016-02-14 NOTE — Telephone Encounter (Signed)
The Affirm is negative for vaginitis.   I am recommending an empiric treatment of Hylafem - vaginal boric acid and probiotic suppository.  Place one, IN THE VAGINA, nightly for 6 nights.  Please send to pharmacy of choice.   If symptoms persist, she will need to return to the office for re-evaluation.

## 2016-02-14 NOTE — Telephone Encounter (Signed)
Spoke with patient. Advised of message as seen below from [place]. Patient is agreeable and verbalizes understanding. Rx for Hylafem place 1 in the vagina nightly for 6 nights #6 0RF sent to pharmacy on file. Patient is agreeable and verbalizes understanding.  Routing to provider for final review. Patient agreeable to disposition. Will close encounter.

## 2016-02-14 NOTE — Telephone Encounter (Signed)
Patient states she was told her results would be in today. She is still having vaginal burning.

## 2016-02-14 NOTE — Telephone Encounter (Signed)
Routing to [place] for review and advise. Affirm testing from 02/13/2016 returned negative. Patient reports she is still having vaginal burning.

## 2016-02-15 ENCOUNTER — Ambulatory Visit (INDEPENDENT_AMBULATORY_CARE_PROVIDER_SITE_OTHER): Payer: Managed Care, Other (non HMO) | Admitting: Adult Health

## 2016-02-15 ENCOUNTER — Telehealth: Payer: Self-pay | Admitting: Obstetrics and Gynecology

## 2016-02-15 ENCOUNTER — Encounter: Payer: Self-pay | Admitting: Adult Health

## 2016-02-15 VITALS — BP 138/78 | Temp 98.1°F | Ht 61.0 in | Wt 161.0 lb

## 2016-02-15 DIAGNOSIS — R0989 Other specified symptoms and signs involving the circulatory and respiratory systems: Secondary | ICD-10-CM | POA: Diagnosis not present

## 2016-02-15 DIAGNOSIS — F329 Major depressive disorder, single episode, unspecified: Secondary | ICD-10-CM

## 2016-02-15 DIAGNOSIS — F32A Depression, unspecified: Secondary | ICD-10-CM

## 2016-02-15 MED ORDER — TRIAMCINOLONE ACETONIDE 0.1 % EX OINT: TOPICAL_OINTMENT | CUTANEOUS | Status: DC

## 2016-02-15 NOTE — Telephone Encounter (Signed)
Patient is taking Hylafem at night and is wondering what she could take during the day?

## 2016-02-15 NOTE — Patient Instructions (Signed)
I will refer you to an allergist, they will call you to schedule an appointment. Let me know if you do not hear.   I will send in a new prescription for Ambien  Follow up as needed

## 2016-02-15 NOTE — Telephone Encounter (Signed)
I recommend taking the medication as prescribed and completing the full 6 day course.  Avoid toilet paper, soaps, pads, and detergents with perfumes.  Do not use alcohol based gels on hands for sanitizing hands.  Triamcinolone ointment 0.1% to vulva externally bid prn.  Please send to pharmacy of choice.

## 2016-02-15 NOTE — Progress Notes (Signed)
Subjective:    Patient ID: Bianca [name], female    DOB: 01/10/1946, 70 y.o.   MRN: [id number]  HPI  70 year old female who presents to the clinic today for for follow up after being seen in the ER 6 days ago. Per ER note:  Pt presents with multiple complaints.   Primarily patient is concerned because she is depressed and feels that her depression has gotten so much worse that she is "not functioning." States she is not eating or sleeping, doesn't want to talk. Is "jumping out of my skin" and "ready to lose it." She is very anxious and was up all night because of it, unable to sleep. Is taking prozac and recently started taking wellbutrin.  Pt has chronic back pain and ran out of her dilaudid 1-2 days ago. Because she has been out, she feels she is now aware of other symptoms such as vaginal burning and urinary frequency. Has had intermittent problems with her bladder and with vaginal burning for months, but symptoms have gotten worse or become more noticeable in the past 2 days. Today she also developed nausea and dry heaving.   Has been admitted and seen by cardiology recently for chest pain. States she has had daily chest tightness that occurs when she stands up that she believes is related to recently finding mold in her house. She has also had nasal congestion. The chest pain that she has experienced today is the same as the chest pain she has had for sometime, no changes. She does have hx MI and multiple cardiac stents.   Chronic back pain is unchanged.   Labs were unremarkable. She saw psychiatry while in the hospital and they had her increase her Prozac from 20 mg to 30 mg and stop Wellbutrin. She was given clonopin to take as needed. She denies SI or HI  Today in the office Bianca [name] continues to complain of depression. She does not feel like the increase in Prozac has helped. She is set up with psychiatry next week in order to get some additional help with her depression.     Additionally, Bianca [name] continues to complain of of SOB with exertion, generalized fatigue, and upper respiratory infection type symptoms. She reports that she has been exposed to black mold and is currently going through the process of eradicating it from her residence.   Recent chest x ray was negative for pulmonary disease.   Review of Systems  Constitutional: Positive for activity change and fatigue. Negative for fever, chills and diaphoresis.  HENT: Positive for congestion.   Respiratory: Positive for cough and shortness of breath. Negative for wheezing.   Cardiovascular: Negative.   Gastrointestinal: Negative.   Musculoskeletal: Positive for myalgias, back pain and arthralgias. Negative for gait problem.  Skin: Negative.   Neurological: Negative.   Psychiatric/Behavioral: Positive for sleep disturbance.       Depression  All other systems reviewed and are negative.  Past Medical History  Diagnosis Date  . CAD (coronary artery disease)     a. 1997 MI/PCI RCA;  b. 2007 MI/PCI of 100% RCA with Taxus DES x 3 placed, EF 55%;  c. 2010 Cath: stable anatomy;  d. 05/2012 NSTEMI/Cath/PCI: LM nl, LAD 60-68m, D1 20ost, LCX 7m, RCA 40-43m ISR, 60/95d (Treated w/ 2.75x33 Xience Xpedition DES), PDA 68m (Treated w/ PTCA), EF 60%.  . Depression   . Hyperlipidemia   . Carpal tunnel syndrome on both sides   . Numbness of foot  Left foot - back surgery 2009  . GERD (gastroesophageal reflux disease)   . Arthritis   . Chronic lower back pain   . Myocardial infarction [facility]) 1997, 2007, 2013  . Blood transfusion without reported diagnosis     Social History   Social History  . Marital Status: Married    Spouse Name: N/A  . Number of Children: N/A  . Years of Education: N/A   Occupational History  . Not on file.   Social History Main Topics  . Smoking status: Light Tobacco Smoker -- 0.50 packs/day for 50 years    Types: Cigarettes  . Smokeless tobacco: Never Used     Comment:  02/13/16 trying to quit--has't smoked x1week Working on it at this time.//kg  . Alcohol Use: No  . Drug Use: No  . Sexual Activity: Not Currently    Birth Control/ Protection: Post-menopausal   Other Topics Concern  . Not on file   Social History Narrative   Is a homemaker   Married for 52 years    Has a daughter and son    Pets: Two dogs and a Neurosurgeon   Likes to garden ( flower beds).           Past Surgical History  Procedure Laterality Date  . Cesarean section  1990  . Ankle surgery  Years ago    Left; "had to take out a floater"  . Posterior fusion lumbar spine  1962; 2009  . Coronary angioplasty with stent placement  1997; 2007, 2013    "2 + 2; + 1= total of 5  . 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 Left heart catheterization with coronary angiogram N/A 05/18/2012    Procedure: LEFT HEART CATHETERIZATION WITH CORONARY ANGIOGRAM;  Surgeon: Wellington Hampshire, MD;  Location: [place] CATH LAB;  Service: Cardiovascular;  Laterality: N/A;  . Carpal tunnel release Left 10/12016    Family History  Problem Relation Age of Onset  . Coronary artery disease Mother   . Diabetes Mother   . Hypertension Mother   . Dementia Father   . Sudden death Brother     Suicide  . Cancer Paternal Grandfather     Esophageal    Allergies  Allergen Reactions  . Oxycodone Hcl Itching and Other (See Comments)    confusion  . Penicillins Itching and Rash    Has patient had a PCN reaction causing immediate rash, facial/tongue/throat swelling, SOB or lightheadedness with hypotension: yes rash that took a while to go away across the abdomen Has patient had a PCN reaction causing severe rash involving mucus membranes or skin necrosis: no Has patient had a PCN reaction that required hospitalization: no Has patient had a PCN reaction occurring within  the last 10 years: No If all of the above answers are "NO", then may proceed with Cephalosporin use.   . Aspirin     Burning in stomach  . Macrobid [Nitrofurantoin Monohyd Macro] Nausea And Vomiting  . Sulfa Antibiotics Nausea Only  . Tramadol Itching    Seeing things    Current Outpatient Prescriptions on File Prior to Visit  Medication Sig Dispense Refill  . clonazePAM (KLONOPIN) 0.5 MG tablet Take 1 tablet (0.5 mg total) by mouth 2 (two) times daily as needed (anxiety). 10 tablet 0  . clopidogrel (PLAVIX) 75 MG tablet TAKE 1  TABLET BY MOUTH ONCE DAILY (Patient taking differently: TAKE 75 MG  BY MOUTH ONCE DAILY) 30 tablet 3  . fluconazole (DIFLUCAN) 150 MG tablet Take 1 tablet (150 mg total) by mouth once. 2 tablet 0  . FLUoxetine (PROZAC) 10 MG capsule Take 3 capsules (30 mg total) by mouth daily. 30 capsule 3  . Homeopathic Products (HYLAFEM) SUPP Place 1 suppository vaginally at bedtime. 6 suppository 0  . HYDROmorphone (DILAUDID) 2 MG tablet Take 2 mg by mouth every 8 (eight) hours as needed. for pain  0  . metoprolol (LOPRESSOR) 50 MG tablet TAKE 1/2 TABLETS (25 MG TOTAL) BY MOUTH 2 (TWO) TIMES DAILY. 90 tablet 1  . omeprazole (PRILOSEC) 40 MG capsule Take 40 mg by mouth daily.    . Probiotic Product (PROBIOTIC PO) Take 1 capsule by mouth daily.     . Red Yeast Rice Extract (RED YEAST RICE PO) Take 1 capsule by mouth daily.     . simvastatin (ZOCOR) 80 MG tablet TAKE 1 TABLET BY MOUTH AT BEDTIME (Patient taking differently: TAKE 80 MG BY MOUTH AT BEDTIME) 30 tablet 4  . UNABLE TO FIND Take 1 capsule by mouth daily. OTC Eye vitamin    . UNABLE TO FIND Inject 1 mL into the skin every 14 (fourteen) days. b12 shots twice a month at MDs office    . zolpidem (AMBIEN) 5 MG tablet Take 1 tablet (5 mg total) by mouth at bedtime. 30 tablet 0   No current facility-administered medications on file prior to visit.    BP 138/78 mmHg  Temp(Src) 98.1 F (36.7 C) (Oral)  Ht 5\' 1"  (1.549 m)   Wt 161 lb (73.029 kg)  BMI 30.44 kg/m2  LMP  (LMP Unknown)       Objective:   Physical Exam  Constitutional: She is oriented to person, place, and time. She appears well-developed and well-nourished.  HENT:  Head: Normocephalic and atraumatic.  Right Ear: External ear normal.  Left Ear: External ear normal.  Nose: Nose normal.  Mouth/Throat: Oropharynx is clear and moist. No oropharyngeal exudate.  Eyes: Conjunctivae and EOM are normal. Pupils are equal, round, and reactive to light. Right eye exhibits no discharge. Left eye exhibits no discharge. No scleral icterus.  Neck: Normal range of motion. Neck supple. No thyromegaly present.  Cardiovascular: Normal rate, regular rhythm, normal heart sounds and intact distal pulses.  Exam reveals no gallop and no friction rub.   No murmur heard. Pulmonary/Chest: Effort normal and breath sounds normal. No respiratory distress. She has no wheezes. She has no rales. She exhibits no tenderness.  Lymphadenopathy:    She has no cervical adenopathy.  Neurological: She is alert and oriented to person, place, and time. She has normal reflexes. She displays normal reflexes. No cranial nerve deficit. She exhibits normal muscle tone. Coordination normal.  Skin: Skin is warm and dry. No rash noted. No erythema. No pallor.  Psychiatric: She has a normal mood and affect. Her behavior is normal. Judgment and thought content normal.  Nursing note and vitals reviewed.     Assessment & Plan:  1. Upper respiratory symptom - Due to her subjective exposure and multiple URI's over the last year. I will refer her to pulmonology and allergy  - Ambulatory referral to Allergy - Ambulatory referral to Pulmonology - Follow up as needed  2. Depression - Keep with current plan until seen by her outpatient psychiatrist.  - Go to the ER with any thoughts of SI or  self harm  - Follow up as needed   [name], NP

## 2016-02-15 NOTE — Telephone Encounter (Signed)
Spoke with patient. Patient sates that she started using Hylafem last night and had relief from vaginal burning over night. During the day vaginal burning has returned. Asking if there is something she could use during the day for relief as well. Advised I will speak with Dr.Silva and return call with further recommendations. She is agreeable.

## 2016-02-15 NOTE — Telephone Encounter (Signed)
Spoke with patient. Advised of message as seen below from [place]. Patient is agreeable and verbalizes understanding. Rx for Triamcinolone ointment 0.1 % to vulva externally bid prn sent to pharmacy on file. Patient is agreeable.  Routing to provider for final review. Patient agreeable to disposition. Will close encounter.

## 2016-02-18 ENCOUNTER — Ambulatory Visit: Payer: Medicare Other | Admitting: Physician Assistant

## 2016-02-20 ENCOUNTER — Ambulatory Visit (INDEPENDENT_AMBULATORY_CARE_PROVIDER_SITE_OTHER): Payer: Managed Care, Other (non HMO) | Admitting: Internal Medicine

## 2016-02-20 ENCOUNTER — Other Ambulatory Visit (INDEPENDENT_AMBULATORY_CARE_PROVIDER_SITE_OTHER): Payer: Managed Care, Other (non HMO)

## 2016-02-20 ENCOUNTER — Other Ambulatory Visit: Payer: Self-pay | Admitting: Obstetrics and Gynecology

## 2016-02-20 ENCOUNTER — Encounter: Payer: Self-pay | Admitting: Internal Medicine

## 2016-02-20 VITALS — BP 116/74 | HR 57 | Ht 61.0 in | Wt 160.0 lb

## 2016-02-20 DIAGNOSIS — R0600 Dyspnea, unspecified: Secondary | ICD-10-CM

## 2016-02-20 DIAGNOSIS — Z720 Tobacco use: Secondary | ICD-10-CM | POA: Diagnosis not present

## 2016-02-20 DIAGNOSIS — F17210 Nicotine dependence, cigarettes, uncomplicated: Secondary | ICD-10-CM

## 2016-02-20 LAB — RESPIRATORY ALLERGY PROFILE REGION II ~~LOC~~
Allergen, Cedar tree, t12: <0.1 kU/L
Allergen, Cottonwood, t14: <0.1 kU/L
Allergen, Mouse Urine Protein, e78: <0.1 kU/L
Aspergillus fumigatus, m3: <0.1 kU/L
Bermuda Grass: <0.1 kU/L
Cat Dander: <0.1 kU/L
Common Ragweed: <0.1 kU/L
Dog Dander: <0.1 kU/L
IGE (IMMUNOGLOBULIN E), SERUM: 7 kU/L (ref <115)
Pecan/Hickory Tree IgE: <0.1 kU/L
Penicillium Notatum: <0.1 kU/L
Timothy Grass: <0.1 kU/L

## 2016-02-20 LAB — SEDIMENTATION RATE: SED RATE: 27 mm/h (ref 0–30)

## 2016-02-20 LAB — BRAIN NATRIURETIC PEPTIDE: Pro B Natriuretic peptide (BNP): 79 pg/mL (ref 0.0–100.0)

## 2016-02-20 LAB — TSH: TSH: 3.73 u[IU]/mL (ref 0.35–4.50)

## 2016-02-20 LAB — NITRIC OXIDE: Nitric Oxide: 15

## 2016-02-20 NOTE — Patient Instructions (Signed)
Please remember to go to the lab   department downstairs for your tests - we will call you with the results when they are available.  Change omeprazole to Take 30- 60 min before your first and last meals of the day   GERD (REFLUX)  is an extremely common cause of respiratory symptoms just like yours , many times with no obvious heartburn at all.    It can be treated with medication, but also with lifestyle changes including elevation of the head of your bed (ideally with 6 inch  bed blocks),  Smoking cessation, avoidance of late meals, excessive alcohol, and avoid fatty foods, chocolate, peppermint, colas, red wine, and acidic juices such as orange juice.  NO MINT OR MENTHOL PRODUCTS SO NO COUGH DROPS  USE SUGARLESS CANDY INSTEAD (Jolley ranchers or Stover's or Life Savers) or even ice chips will also do - the key is to swallow to prevent all throat clearing. NO OIL BASED VITAMINS - use powdered substitutes.   The key is to stop smoking completely before smoking completely stops you - it's clearly no too late as you have no copd at all   Call in 2 weeks if not improving

## 2016-02-20 NOTE — Progress Notes (Signed)
Subjective:    Patient ID: Bianca [name], female    DOB: [birth year],   MRN: [id number]  HPI  88 yowf active smoker with h/o sinus symptoms mostly in spring ever since moving to [facility] around 1970s from [place] with ENT neg previous but never allergy eval  with new onset chest tightness and sob x mid June 2017 > 2 trips to ER with neg Cardiac eval referred to pulmonary clinic 02/20/2016 by [name].   02/20/2016 [facility]st [facility] office visit/ Wert  Onset of symptoms was late March 2017 starting with "sinus infection" Chief Complaint  Patient presents with  . Pulmonary Consult    Referred by [name]. Pt c/o chest tightness and SOB for the past 6 wks- she believes that this is due to mold exposure. She states chest tightness comes and goes, but bothers her more when she lies down. She notices SOB "maybe when I rush up some steps".    on ppi x sev years takes 10 before supper  When arrived ER 12/25/15 cc "sob /chest heavy x awhile" then x  4 days much worse assoc with cough productive with green mucus and assoc with nausea and stated  "her doctors kept saying it's bronchitis but is not getting any better" > rec doxy > cough resolved but not the chest tightness or sob and intermittently having dysphagia since onset of symptoms and overt anxiety/depression  Symptoms are the same since ER x cough better, no variability x worse at hs consistently the same and doe x fast walk or steps.  Does not bother her once asleep  No obvious other patterns in day to day or daytime variabilty or assoc chronic cough or cp or chest tightness, subjective wheeze overt active sinus  symptoms. No unusual exp hx or h/o childhood pna/ asthma or knowledge of premature birth.  Sleeping ok without nocturnal  or early am exacerbation  of respiratory  c/o's or need for noct saba. Also denies any obvious fluctuation of symptoms with weather or environmental changes or other aggravating or alleviating factors except as  outlined above   Current Medications, Allergies, Complete Past Medical History, Past Surgical History, Family History, and Social History were reviewed in [employer] record.                   Review of Systems  Constitutional: Positive for appetite change. Negative for fever, chills and unexpected weight change.  HENT: Positive for trouble swallowing. Negative for congestion, dental problem, ear pain, nosebleeds, postnasal drip, rhinorrhea, sinus pressure, sneezing, sore throat and voice change.   Eyes: Negative for visual disturbance.  Respiratory: Positive for shortness of breath. Negative for cough and choking.   Cardiovascular: Negative for chest pain and leg swelling.  Gastrointestinal: Negative for vomiting, abdominal pain and diarrhea.       Acid heartburn  Genitourinary: Negative for difficulty urinating.  Musculoskeletal: Negative for arthralgias.  Skin: Negative for rash.  Neurological: Negative for tremors, syncope and headaches.  Hematological: Does not bruise/bleed easily.       Objective:   Physical Exam  amb wf nad very shaky on details of care, denies ever taking macrobid /macrodantin but record suggests otherwise   Wt Readings from Last 3 Encounters:  02/20/16 160 lb (72.576 kg)  02/15/16 161 lb (73.029 kg)  02/13/16 163 lb (73.936 kg)    Vital signs reviewed   HEENT: nl dentition, turbinates, and oropharynx. Nl external ear canals without cough reflex  NECK :  without JVD/Nodes/TM/ nl carotid upstrokes bilaterally   LUNGS: no acc muscle use,  Nl contour chest which is clear to A and P bilaterally without cough on insp or exp maneuvers   CV:  RRR  no s3 or murmur or increase in P2, no edema   ABD:  soft and nontender with nl inspiratory excursion in the supine position. No bruits or organomegaly, bowel sounds nl  MS:  Nl gait/ ext warm without deformities, calf tenderness, cyanosis or clubbing No obvious joint  restrictions   SKIN: warm and dry without lesions    NEURO:  alert, approp, nl sensorium with  no motor deficits     I personally reviewed images and agree with radiology impression as follows:  CXR:  01/26/16  No active cardiopulmonary disease.    Labs ordered/ reviewed:      Chemistry      Component Value Date/Time   NA 135 02/09/2016 1042   K 3.7 02/09/2016 1042   CL 102 02/09/2016 1042   CO2 24 02/09/2016 1042   BUN <5* 02/09/2016 1042   CREATININE 0.69 02/09/2016 1042      Component Value Date/Time   CALCIUM 9.4 02/09/2016 1042   ALKPHOS 90 02/09/2016 1042   AST 22 02/09/2016 1042   ALT 11* 02/09/2016 1042   BILITOT 0.9 02/09/2016 1042        Lab Results  Component Value Date   WBC 7.2 02/09/2016   HGB 12.9 02/09/2016   HCT 35.2* 02/09/2016   MCV 85.4 02/09/2016   PLT 354 02/09/2016         Lab Results  Component Value Date   TSH 3.73 02/20/2016     Lab Results  Component Value Date   PROBNP 79.0 02/20/2016       Lab Results  Component Value Date   ESRSEDRATE 27 02/20/2016   ESRSEDRATE 16 12/28/2009           Assessment & Plan:

## 2016-02-21 NOTE — Progress Notes (Signed)
Quick Note:  Spoke with pt and notified of results per Dr. Wert. Pt verbalized understanding and denied any questions.  ______ 

## 2016-02-21 NOTE — Assessment & Plan Note (Signed)

## 2016-02-21 NOTE — Assessment & Plan Note (Addendum)
02/20/2016  Walked RA x 3 laps @ 185 ft each stopped due to end of study, nl pace, no significant desat or sob but did c/o back pain   - spirometry 02/20/2016 wnl including fef 25-75 with active symptoms- FENO 02/20/2016  =   15  - Allergy profile 02/20/2016 >  Eos 0.0 /  IgE  7 Neg RAST  Symptoms are markedly disproportionate to objective findings and not clear this is a lung problem but pt does appear to have difficult airway management issues. DDX of  difficult airways management almost all start with A and  include Adherence, Ace Inhibitors, Acid Reflux, Active Sinus Disease, Alpha 1 Antitripsin deficiency, Anxiety masquerading as Airways dz,  ABPA,  Allergy(esp in young), Aspiration (esp in elderly), Adverse effects of meds,  Active smokers, A bunch of PE's (a small clot burden can't cause this syndrome unless there is already severe underlying pulm or vascular dz with poor reserve) plus two Bs  = Bronchiectasis and Beta blocker use..and one C= CHF  Adherence is always the initial "prime suspect" and is a multilayered concern that requires a "trust but verify" approach in every patient - starting with knowing how to use medications, especially inhalers, correctly, keeping up with refills and understanding the fundamental difference between maintenance and prns vs those medications only taken for a very short course and then stopped and not refilled.   ? Acid (or non-acid) GERD > always difficult to exclude as up to 75% of pts in some series report no assoc GI/ Heartburn symptoms> rec max (24h)  acid suppression and diet restrictions/ reviewed and instructions given in writing.  - note assoc with dysphagia so needs UGI or GI eval if not better   ? Anxiety > usually at the bottom of this list of usual suspects but should be much higher on this pt's based on H and P and note already on psychotropics .   ? Allergies/ asthma > likely  excluded with nl NO, nl FEF25-75 with active symptoms on day of study  ?  chf >  Cards w/u neg/ bnp and nl echo 01/27/16 in setting of ongoing daily symptoms< 100 so very unlikely   rec first trial of max rx for gerd then regroup in 2 weeks if not improving  Total time devoted to counseling  = 35/58m review case with pt/ discussion of options/alternatives/ personally creating written instructions  in presence of pt  then going over those specific  Instructions directly with the pt including how to use all of the meds but in particular covering each new medication in detail and the difference between the maintenance/automatic meds and the prns using an action plan format for the latter.    Total time devoted to counseling  = 35/75m review case with pt/ discussion of options/alternatives/ personally creating written instructions  in presence of pt  then going over those specific  Instructions directly with the pt including how to use all of the meds but in particular covering each new medication in detail and the difference between the maintenance/automatic meds and the prns using an action plan format for the latter.

## 2016-02-22 ENCOUNTER — Ambulatory Visit (INDEPENDENT_AMBULATORY_CARE_PROVIDER_SITE_OTHER): Payer: Managed Care, Other (non HMO) | Admitting: Obstetrics and Gynecology

## 2016-02-22 ENCOUNTER — Other Ambulatory Visit: Payer: Self-pay | Admitting: Adult Health

## 2016-02-22 ENCOUNTER — Encounter: Payer: Self-pay | Admitting: Obstetrics and Gynecology

## 2016-02-22 VITALS — BP 144/70 | HR 92 | Resp 16 | Wt 161.6 lb

## 2016-02-22 DIAGNOSIS — R102 Pelvic and perineal pain: Secondary | ICD-10-CM | POA: Diagnosis not present

## 2016-02-22 NOTE — Telephone Encounter (Signed)
Ok to refill for one month  

## 2016-02-22 NOTE — Patient Instructions (Signed)
(CYMBALTA) Duloxetine delayed-release capsules - this can be used to treat depression and sometimes chronic pain.  What is this medicine? DULOXETINE (doo LOX e teen) is used to treat depression, anxiety, and different types of chronic pain. This medicine may be used for other purposes; ask your health care provider or pharmacist if you have questions. What should I tell my health care provider before I take this medicine? They need to know if you have any of these conditions: -bipolar disorder or a family history of bipolar disorder -glaucoma -kidney disease -liver disease -suicidal thoughts or a previous suicide attempt -taken medicines called MAOIs like Carbex, Eldepryl, Marplan, Nardil, and Parnate within 14 days -an unusual reaction to duloxetine, other medicines, foods, dyes, or preservatives -pregnant or trying to get pregnant -breast-feeding How should I use this medicine? Take this medicine by mouth with a glass of water. Follow the directions on the prescription label. Do not cut, crush or chew this medicine. You can take this medicine with or without food. Take your medicine at regular intervals. Do not take your medicine more often than directed. Do not stop taking this medicine suddenly except upon the advice of your doctor. Stopping this medicine too quickly may cause serious side effects or your condition may worsen. A special MedGuide will be given to you by the pharmacist with each prescription and refill. Be sure to read this information carefully each time. Talk to your pediatrician regarding the use of this medicine in children. While this drug may be prescribed for children as young as 6 years of age for selected conditions, precautions do apply. Overdosage: If you think you have taken too much of this medicine contact a poison control center or emergency room at once. NOTE: This medicine is only for you. Do not share this medicine with others. What if I miss a dose? If you  miss a dose, take it as soon as you can. If it is almost time for your next dose, take only that dose. Do not take double or extra doses. What may interact with this medicine? Do not take this medicine with any of the following medications: -certain diet drugs like dexfenfluramine, fenfluramine -desvenlafaxine -linezolid -MAOIs like Azilect, Carbex, Eldepryl, Marplan, Nardil, and Parnate -methylene blue (intravenous) -milnacipran -thioridazine -venlafaxine This medicine may also interact with the following medications: -alcohol -aspirin and aspirin-like medicines -certain antibiotics like ciprofloxacin and enoxacin -certain medicines for blood pressure, heart disease, irregular heart beat -certain medicines for depression, anxiety, or psychotic disturbances -certain medicines for migraine headache like almotriptan, eletriptan, frovatriptan, naratriptan, rizatriptan, sumatriptan, zolmitriptan -certain medicines that treat or prevent blood clots like warfarin, enoxaparin, and dalteparin -cimetidine -fentanyl -lithium -NSAIDS, medicines for pain and inflammation, like ibuprofen or naproxen -phentermine -procarbazine -sibutramine -St. John's wort -theophylline -tramadol -tryptophan This list may not describe all possible interactions. Give your health care provider a list of all the medicines, herbs, non-prescription drugs, or dietary supplements you use. Also tell them if you smoke, drink alcohol, or use illegal drugs. Some items may interact with your medicine. What should I watch for while using this medicine? Tell your doctor if your symptoms do not get better or if they get worse. Visit your doctor or health care professional for regular checks on your progress. Because it may take several weeks to see the full effects of this medicine, it is important to continue your treatment as prescribed by your doctor. Patients and their families should watch out for new or worsening thoughts  of  suicide or depression. Also watch out for sudden changes in feelings such as feeling anxious, agitated, panicky, irritable, hostile, aggressive, impulsive, severely restless, overly excited and hyperactive, or not being able to sleep. If this happens, especially at the beginning of treatment or after a change in dose, call your health care professional. Dennis Bast may get drowsy or dizzy. Do not drive, use machinery, or do anything that needs mental alertness until you know how this medicine affects you. Do not stand or sit up quickly, especially if you are an older patient. This reduces the risk of dizzy or fainting spells. Alcohol may interfere with the effect of this medicine. Avoid alcoholic drinks. This medicine can cause an increase in blood pressure. This medicine can also cause a sudden drop in your blood pressure, which may make you feel faint and increase the chance of a fall. These effects are most common when you first start the medicine or when the dose is increased, or during use of other medicines that can cause a sudden drop in blood pressure. Check with your doctor for instructions on monitoring your blood pressure while taking this medicine. Your mouth may get dry. Chewing sugarless gum or sucking hard candy, and drinking plenty of water may help. Contact your doctor if the problem does not go away or is severe. What side effects may I notice from receiving this medicine? Side effects that you should report to your doctor or health care professional as soon as possible: -allergic reactions like skin rash, itching or hives, swelling of the face, lips, or tongue -changes in blood pressure -confusion -dark urine -dizziness -fast talking and excited feelings or actions that are out of control -fast, irregular heartbeat -fever -general ill feeling or flu-like symptoms -hallucination, loss of contact with reality -light-colored stools -loss of balance or coordination -redness, blistering,  peeling or loosening of the skin, including inside the mouth -right upper belly pain -seizures -suicidal thoughts or other mood changes -trouble concentrating -trouble passing urine or change in the amount of urine -unusual bleeding or bruising -unusually weak or tired -yellowing of the eyes or skin Side effects that usually do not require medical attention (report to your doctor or health care professional if they continue or are bothersome): -blurred vision -change in appetite -change in sex drive or performance -headache -increased sweating -nausea This list may not describe all possible side effects. Call your doctor for medical advice about side effects. You may report side effects to FDA at 1-800-FDA-1088. Where should I keep my medicine? Keep out of the reach of children. Store at room temperature between 20 and 25 degrees C (68 to 77 degrees F). Throw away any unused medicine after the expiration date. NOTE: This sheet is a summary. It may not cover all possible information. If you have questions about this medicine, talk to your doctor, pharmacist, or health care provider.    2016, Elsevier/Gold Standard. (2013-07-26 16:28:32)  Gabapentin capsules or tablets - this can treat be used to treat chronic pain. What is this medicine? GABAPENTIN (GA ba pen tin) is used to control partial seizures in adults with epilepsy. It is also used to treat certain types of nerve pain. This medicine may be used for other purposes; ask your health care provider or pharmacist if you have questions. What should I tell my health care provider before I take this medicine? They need to know if you have any of these conditions: -kidney disease -suicidal thoughts, plans, or attempt; a previous suicide attempt  by you or a family member -an unusual or allergic reaction to gabapentin, other medicines, foods, dyes, or preservatives -pregnant or trying to get pregnant -breast-feeding How should I use  this medicine? Take this medicine by mouth with a glass of water. Follow the directions on the prescription label. You can take it with or without food. If it upsets your stomach, take it with food.Take your medicine at regular intervals. Do not take it more often than directed. Do not stop taking except on your doctor's advice. If you are directed to break the 600 or 800 mg tablets in half as part of your dose, the extra half tablet should be used for the next dose. If you have not used the extra half tablet within 28 days, it should be thrown away. A special MedGuide will be given to you by the pharmacist with each prescription and refill. Be sure to read this information carefully each time. Talk to your pediatrician regarding the use of this medicine in children. Special care may be needed. Overdosage: If you think you have taken too much of this medicine contact a poison control center or emergency room at once. NOTE: This medicine is only for you. Do not share this medicine with others. What if I miss a dose? If you miss a dose, take it as soon as you can. If it is almost time for your next dose, take only that dose. Do not take double or extra doses. What may interact with this medicine? Do not take this medicine with any of the following medications: -other gabapentin products This medicine may also interact with the following medications: -alcohol -antacids -antihistamines for allergy, cough and cold -certain medicines for anxiety or sleep -certain medicines for depression or psychotic disturbances -homatropine; hydrocodone -naproxen -narcotic medicines (opiates) for pain -phenothiazines like chlorpromazine, mesoridazine, prochlorperazine, thioridazine This list may not describe all possible interactions. Give your health care provider a list of all the medicines, herbs, non-prescription drugs, or dietary supplements you use. Also tell them if you smoke, drink alcohol, or use illegal  drugs. Some items may interact with your medicine. What should I watch for while using this medicine? Visit your doctor or health care professional for regular checks on your progress. You may want to keep a record at home of how you feel your condition is responding to treatment. You may want to share this information with your doctor or health care professional at each visit. You should contact your doctor or health care professional if your seizures get worse or if you have any new types of seizures. Do not stop taking this medicine or any of your seizure medicines unless instructed by your doctor or health care professional. Stopping your medicine suddenly can increase your seizures or their severity. Wear a medical identification bracelet or chain if you are taking this medicine for seizures, and carry a card that lists all your medications. You may get drowsy, dizzy, or have blurred vision. Do not drive, use machinery, or do anything that needs mental alertness until you know how this medicine affects you. To reduce dizzy or fainting spells, do not sit or stand up quickly, especially if you are an older patient. Alcohol can increase drowsiness and dizziness. Avoid alcoholic drinks. Your mouth may get dry. Chewing sugarless gum or sucking hard candy, and drinking plenty of water will help. The use of this medicine may increase the chance of suicidal thoughts or actions. Pay special attention to how you are responding while  on this medicine. Any worsening of mood, or thoughts of suicide or dying should be reported to your health care professional right away. Women who become pregnant while using this medicine may enroll in the [place] Pregnancy Registry by calling [phone]. This registry collects information about the safety of antiepileptic drug use during pregnancy. What side effects may I notice from receiving this medicine? Side effects that you should report to your  doctor or health care professional as soon as possible: -allergic reactions like skin rash, itching or hives, swelling of the face, lips, or tongue -worsening of mood, thoughts or actions of suicide or dying Side effects that usually do not require medical attention (report to your doctor or health care professional if they continue or are bothersome): -constipation -difficulty walking or controlling muscle movements -dizziness -nausea -slurred speech -tiredness -tremors -weight gain This list may not describe all possible side effects. Call your doctor for medical advice about side effects. You may report side effects to FDA at 1-800-FDA-1088. Where should I keep my medicine? Keep out of reach of children. This medicine may cause accidental overdose and death if it taken by other adults, children, or pets. Mix any unused medicine with a substance like cat litter or coffee grounds. Then throw the medicine away in a sealed container like a sealed bag or a coffee can with a lid. Do not use the medicine after the expiration date. Store at room temperature between 15 and 30 degrees C (59 and 86 degrees F). NOTE: This sheet is a summary. It may not cover all possible information. If you have questions about this medicine, talk to your doctor, pharmacist, or health care provider.    2016, Elsevier/Gold Standard. (2013-09-30 15:26:50)

## 2016-02-22 NOTE — Telephone Encounter (Signed)
Spoke with Bianca [name] - this medication was last prescribed by Dr. Harrington Challenger with psychiatry. Bianca [name] states that if patient needs a refill on this medication, then she needs to follow up with Dr. Harrington Challenger. Patient notified via voicemail.

## 2016-02-22 NOTE — Telephone Encounter (Signed)
Pt would like a call back

## 2016-02-22 NOTE — Progress Notes (Signed)
GYNECOLOGY  VISIT   HPI: 70 y.o.   Married  Caucasian  female   G2P2 with No LMP recorded (lmp unknown). Patient is postmenopausal.   here for   Vaginal burning internally.  Has pain for 2 years. This all started with a bladder infection.  Bartholome Bill had multiple visits for her symptoms - office visits, ER visits.  Has been treated for presumptive yeast infection, and medications had not helped her symptoms outside of Hylafem which gave night time relief. Patient is asking for relief of her pain now.  Her burning is increasing her depression.    No vaginal discharge.  Taking probiotics once a day.    Just started Prozac and Wellbutrin.  Seeing psychiatrist - Dr. Louretta Shorten at [facility]. [phone]. Just increased the dosage of Prozac recently.   Hx CAD. 3 MIs.   GYNECOLOGIC HISTORY: No LMP recorded (lmp unknown). Patient is postmenopausal. Contraception: postmenopausal  Menopausal hormone therapy:  No  Last mammogram:  2016 at [facility] per patient-- normal  Last pap smear:   09/11/2015 negative         OB History    Gravida Para Term Preterm AB TAB SAB Ectopic Multiple Living   2 2        2          Patient Active Problem List   Diagnosis Date Noted  . Dyspnea 02/20/2016  . Hyponatremia 01/27/2016  . Bradycardia 01/27/2016  . B12 deficiency 07/11/2015  . Anemia associated with acute blood loss 05/28/2014  . Cigarette smoker 05/28/2014  . OA (osteoarthritis) of hip 05/24/2014  . Chest pain 12/19/2013  . NSTEMI (non-ST elevated myocardial infarction) 05/18/2012  . Allergic rhinitis due to pollen 04/01/2012  . Back pain 11/22/2010  . NEOPLASM OF UNCERTAIN BEHAVIOR OF SKIN 06/05/2010  . GERD 07/30/2009  . Hyperlipidemia 03/02/2007  . Depression 03/02/2007  . Coronary atherosclerosis 03/02/2007    Past Medical History  Diagnosis Date  . CAD (coronary artery disease)     a. 1997 MI/PCI RCA;  b. 2007 MI/PCI of 100% RCA with Taxus DES x 3 placed, EF 55%;  c.  2010 Cath: stable anatomy;  d. 05/2012 NSTEMI/Cath/PCI: LM nl, LAD 60-24m, D1 20ost, LCX 53m, RCA 40-8m ISR, 60/95d (Treated w/ 2.75x33 Xience Xpedition DES), PDA 44m (Treated w/ PTCA), EF 60%.  . Depression   . Hyperlipidemia   . Carpal tunnel syndrome on both sides   . Numbness of foot     Left foot - back surgery 2009  . GERD (gastroesophageal reflux disease)   . Arthritis   . Chronic lower back pain   . Myocardial infarction [facility]) 1997, 2007, 2013  . Blood transfusion without reported diagnosis     Past Surgical History  Procedure Laterality Date  . Cesarean section  1990  . Ankle surgery  Years ago    Left; "had to take out a floater"  . Posterior fusion lumbar spine  1962; 2009  . Coronary angioplasty with stent placement  1997; 2007, 2013    "2 + 2; + 1= total of 5  . 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 Left heart catheterization with coronary angiogram N/A 05/18/2012    Procedure: LEFT HEART CATHETERIZATION WITH CORONARY ANGIOGRAM;  Surgeon: Wellington Hampshire, MD;  Location: [facility]ATH [facility];  Service: Cardiovascular;  Laterality: N/A;  .  Carpal tunnel release Left 10/12016    Current Outpatient Prescriptions  Medication Sig Dispense Refill  . clonazePAM (KLONOPIN) 0.5 MG tablet Take 1 tablet (0.5 mg total) by mouth 2 (two) times daily as needed (anxiety). 10 tablet 0  . clopidogrel (PLAVIX) 75 MG tablet TAKE 1 TABLET BY MOUTH ONCE DAILY (Patient taking differently: TAKE 75 MG  BY MOUTH ONCE DAILY) 30 tablet 3  . FLUoxetine (PROZAC) 10 MG capsule Take 3 capsules (30 mg total) by mouth daily. 30 capsule 3  . Homeopathic Products (HYLAFEM) SUPP Place 1 suppository vaginally at bedtime. 6 suppository 0  . HYDROmorphone (DILAUDID) 2 MG tablet Take 2 mg by mouth every 8 (eight) hours as needed. for pain  0  . metoprolol  (LOPRESSOR) 50 MG tablet TAKE 1/2 TABLETS (25 MG TOTAL) BY MOUTH 2 (TWO) TIMES DAILY. 90 tablet 1  . omeprazole (PRILOSEC) 40 MG capsule Take 40 mg by mouth daily.    . Probiotic Product (PROBIOTIC PO) Take 1 capsule by mouth daily.     . Red Yeast Rice Extract (RED YEAST RICE PO) Take 1 capsule by mouth daily.     . simvastatin (ZOCOR) 80 MG tablet TAKE 1 TABLET BY MOUTH AT BEDTIME (Patient taking differently: TAKE 80 MG BY MOUTH AT BEDTIME) 30 tablet 4  . triamcinolone ointment (KENALOG) 0.1 % Apply to vulva externally bid prn. 30 g 0  . UNABLE TO FIND Take 1 capsule by mouth daily. OTC Eye vitamin    . UNABLE TO FIND Inject 1 mL into the skin every 14 (fourteen) days. b12 shots twice a month at MDs office    . zolpidem (AMBIEN) 5 MG tablet Take 1 tablet (5 mg total) by mouth at bedtime. 30 tablet 0   No current facility-administered medications for this visit.     ALLERGIES: Oxycodone hcl; Penicillins; Aspirin; Macrobid; Sulfa antibiotics; and Tramadol  Family History  Problem Relation Age of Onset  . Coronary artery disease Mother   . Diabetes Mother   . Hypertension Mother   . Dementia Father   . Sudden death Brother     Suicide  . Cancer Paternal Grandfather     Esophageal    Social History   Social History  . Marital Status: Married    Spouse Name: N/A  . Number of Children: N/A  . Years of Education: N/A   Occupational History  . Not on file.   Social History Main Topics  . Smoking status: Light Tobacco Smoker -- 0.50 packs/day for 50 years    Types: Cigarettes  . Smokeless tobacco: Never Used  . Alcohol Use: No  . Drug Use: No  . Sexual Activity: Not Currently    Birth Control/ Protection: Post-menopausal   Other Topics Concern  . Not on file   Social History Narrative   Is a homemaker   Married for 50 years    Has a daughter and son    Pets: Two dogs and a Neurosurgeon   Likes to garden ( flower beds).           ROS:  Pertinent items are noted in  HPI.  PHYSICAL EXAMINATION:    LMP  (LMP Unknown)    General appearance: alert, cooperative and appears stated age   Pelvic: External genitalia:  no lesions.  No external tenderness with testing with a Q tip.              Urethra:  normal appearing urethra with no masses, tenderness  or lesions              Bartholins and Skenes: normal                 Vagina: normal appearing vagina with normal color and discharge, no lesions              Cervix: no lesions         Bimanual Exam:  Uterus:  normal size, contour, position, consistency, mobility, non-tender              Adnexa: no mass, fullness, tenderness          Chaperone was present for exam.  ASSESSMENT  Vaginal burning.  No signs of infectious vaginitis.  Depression.  Hx CAD with 3 MIs.  PLAN  Patient and I discussed vaginal pain and further evaluation with pelvic ultrasound to rule out a mass as a cause of her discomfort.  She will start vit E vaginal suppositories today.   I discussed Cymbalta or Gabapentin for chronic pain.  I think that these should be prescribed by her psychiatrist.  I have given her written information about this today, and she will get in touch with her provider. I have discussed pelvic floor therapy which the patient will consider.  She may benefit from a urology consultation and cystoscopy.  I am hesitant to prescribe vaginal estrogen therapy give her cardiac history.  We will try other options at this time.    An After Visit Summary was printed and given to the patient.  _40_____ minutes face to face time of which over 50% was spent in counseling.

## 2016-02-22 NOTE — Telephone Encounter (Signed)
Ok to refill 

## 2016-02-25 ENCOUNTER — Telehealth: Payer: Self-pay | Admitting: Obstetrics and Gynecology

## 2016-02-25 NOTE — Telephone Encounter (Signed)
Spoke with patient. Advised of message as seen below from [place]. Patient states she has tried a tap water douche without relief. Reports she has contacted her psychiatrist and was told that Dr.Silva should be prescribing these medications. Asked the patient if she asked her psychiatrist about Cymbalta and Gabapentin. States she asked "If they could change what I am taking so Dr.Silva could write me a prescription for this pain and they told me I need to talk to your office. It is all confusing and I just want relief, but this could take days." Advised I will speak with Dr.Silva regarding medications and recommendations and return call. She is agreeable.  Dr.Silva, please advise next steps for patient.

## 2016-02-25 NOTE — Telephone Encounter (Signed)
Spoke with patient. Patient states that she started using vitamin e suppositories over the weekend with "very little" relief of vaginal burning. Patient is anxious due to her vaginal burning and states it is affecting her depression.  "I need some relief now. This is going to set me over the edge if I don't get something to help." States she discussed medications with Dr.Silva, but was advised she needed to discuss having these prescribed by her psychiatrist. "I am working on getting my medications changed, but it is going to take a while." States she has taken Hydromorphone 2 mg as needed before without an problems with her current medications. Asking if Dr.Silva can prescribe this for her. "I just need relief." Advised I will speak with Dr.Silva and return call with further recommendations. PUS appointment scheduled for 03/06/2016 at 8 am with 8:30 am consult with Dr.Silva. Patient declines earlier PUS on 7/13 due to her schedule.

## 2016-02-25 NOTE — Telephone Encounter (Signed)
I am unable to prescribe narcotics for her.  She may want to do a tap water douche to see if this will allow her some relief.  I encourage her to contact her psychiatrist about the medications we discussed for chronic pain - Cymbalta and Gabapentin.

## 2016-02-25 NOTE — Telephone Encounter (Signed)
Patient says she is still having vaginal burning.

## 2016-02-26 NOTE — Telephone Encounter (Signed)
Spoke with patient. Advised patient Dr.Silva is out of the office today and that we have reached out to her psychiatrist office to discuss her care. Patient states she has also reached out and feels she may be "getting some answers." Advised our office will return call with further recommendations upon Dr.Silva's return to the office tomorrow and return call from [place]. She is agreeable.

## 2016-02-26 NOTE — Telephone Encounter (Signed)
Phone call to Dr Barnetta Hammersmith office [phone]), left message to call back regarding mutual patient. No name left on voice mail.

## 2016-02-26 NOTE — Telephone Encounter (Signed)
Patient calling to check the status of message from yesterday.

## 2016-02-27 NOTE — Telephone Encounter (Signed)
Routing to provider for final review. Patient agreeable to disposition. Will close encounter.     

## 2016-02-27 NOTE — Telephone Encounter (Signed)
Call received from [place] at Dr Barnetta Hammersmith office. Advised we are calling to provide clarification on recommendation. Rama states that pain medication is not something they want to prescribe.  Advised we agree that narcotic pain medication is not a solution and we will not prescribe this either. Dr Quincy Simmonds recommended Cymbalta or Gabapentin as options that may help the underlying cause of her vaginal pain. Testing to date has been negative.  Rama states she will forward this information to Dr Lamar Benes.

## 2016-02-27 NOTE — Telephone Encounter (Signed)
Patient is scheduled for a PUS on 03/06/2016 at 8 am with 8:30 am consult with Dr.Silva.  Routing to Dr.Silva as FYI.

## 2016-02-27 NOTE — Telephone Encounter (Signed)
Phone call has been placed to the patient's psychiatry office to discuss her medications.  I recommend a pelvic ultrasound as her next step from our office.

## 2016-03-06 ENCOUNTER — Ambulatory Visit (INDEPENDENT_AMBULATORY_CARE_PROVIDER_SITE_OTHER): Payer: Managed Care, Other (non HMO) | Admitting: Obstetrics and Gynecology

## 2016-03-06 ENCOUNTER — Ambulatory Visit (INDEPENDENT_AMBULATORY_CARE_PROVIDER_SITE_OTHER): Payer: Managed Care, Other (non HMO)

## 2016-03-06 ENCOUNTER — Encounter: Payer: Self-pay | Admitting: Obstetrics and Gynecology

## 2016-03-06 VITALS — BP 110/70 | HR 56 | Ht 61.0 in | Wt 161.0 lb

## 2016-03-06 DIAGNOSIS — R102 Pelvic and perineal pain: Secondary | ICD-10-CM

## 2016-03-06 DIAGNOSIS — N949 Unspecified condition associated with female genital organs and menstrual cycle: Secondary | ICD-10-CM | POA: Diagnosis not present

## 2016-03-06 MED ORDER — NONFORMULARY OR COMPOUNDED ITEM: Status: DC

## 2016-03-06 NOTE — Progress Notes (Signed)
GYNECOLOGY  VISIT   HPI: 70 y.o.   Married  Caucasian  female   G2P2 with No LMP recorded (lmp unknown). Bianca [name] is postmenopausal.   here for   Vaginal burning for 2 years.  Burns every day.  Negative vaginitis testing.  Negative urine culture.  Hylafem at hs for 6 nights helped this burning at night.  Vitamin E can be helpful or not.  Was using this 3 times per day.  Has tried coconut oil and it helped a little bit.   Seeing psychiatrist - Dr. Louretta Shorten at [facility]. [phone]. Just increased the dosage of Prozac recently to 40 mg.  Started Prozac 6 weeks ago. Also on Wellbutrin which was started 6 weeks ago. Psychiatry stated he would not change medication to Cymbalta or Gabapentin. Needs to continue with current care plan.   Hx CAD. 3 MIs.   GYNECOLOGIC HISTORY: No LMP recorded (lmp unknown). Bianca [name] is postmenopausal.          OB History    Gravida Para Term Preterm AB TAB SAB Ectopic Multiple Living   2 2        2          Bianca [name] Active Problem List   Diagnosis Date Noted  . Dyspnea 02/20/2016  . Hyponatremia 01/27/2016  . Bradycardia 01/27/2016  . B12 deficiency 07/11/2015  . Anemia associated with acute blood loss 05/28/2014  . Cigarette smoker 05/28/2014  . OA (osteoarthritis) of hip 05/24/2014  . Chest pain 12/19/2013  . NSTEMI (non-ST elevated myocardial infarction) 05/18/2012  . Allergic rhinitis due to pollen 04/01/2012  . Back pain 11/22/2010  . NEOPLASM OF UNCERTAIN BEHAVIOR OF SKIN 06/05/2010  . GERD 07/30/2009  . Hyperlipidemia 03/02/2007  . Depression 03/02/2007  . Coronary atherosclerosis 03/02/2007    Past Medical History  Diagnosis Date  . CAD (coronary artery disease)     a. 1997 MI/PCI RCA;  b. 2007 MI/PCI of 100% RCA with Taxus DES x 3 placed, EF 55%;  c. 2010 Cath: stable anatomy;  d. 05/2012 NSTEMI/Cath/PCI: LM nl, LAD 60-2m, D1 20ost, LCX 23m, RCA 40-59m ISR, 60/95d (Treated w/ 2.75x33 Xience Xpedition DES), PDA 54m  (Treated w/ PTCA), EF 60%.  . Depression   . Hyperlipidemia   . Carpal tunnel syndrome on both sides   . Numbness of foot     Left foot - back surgery 2009  . GERD (gastroesophageal reflux disease)   . Arthritis   . Chronic lower back pain   . Myocardial infarction [facility]) 1997, 2007, 2013  . Blood transfusion without reported diagnosis     Past Surgical History  Procedure Laterality Date  . Cesarean section  1990  . Ankle surgery  Years ago    Left; "had to take out a floater"  . Posterior fusion lumbar spine  1962; 2009  . Coronary angioplasty with stent placement  1997; 2007, 2013    "2 + 2; + 1= total of 5  . 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 Left heart catheterization with coronary angiogram N/A 05/18/2012    Procedure: LEFT HEART CATHETERIZATION WITH CORONARY ANGIOGRAM;  Surgeon: Wellington Hampshire, MD;  Location: [place] CATH [facility];  Service: Cardiovascular;  Laterality: N/A;  . Carpal tunnel release Left 10/12016    Current Outpatient Prescriptions  Medication Sig Dispense Refill  . buPROPion (  WELLBUTRIN SR) 100 MG 12 hr tablet     . clonazePAM (KLONOPIN) 0.5 MG tablet Take 1 tablet (0.5 mg total) by mouth 2 (two) times daily as needed (anxiety). 10 tablet 0  . clopidogrel (PLAVIX) 75 MG tablet TAKE 1 TABLET BY MOUTH ONCE DAILY (Bianca [name] taking differently: TAKE 75 MG  BY MOUTH ONCE DAILY) 30 tablet 3  . FLUoxetine (PROZAC) 40 MG capsule     . Homeopathic Products (HYLAFEM) SUPP Place 1 suppository vaginally at bedtime. 6 suppository 0  . HYDROmorphone (DILAUDID) 2 MG tablet Take 2 mg by mouth every 8 (eight) hours as needed. for pain  0  . hydrOXYzine (ATARAX/VISTARIL) 25 MG tablet Reported on 02/22/2016    . metoprolol (LOPRESSOR) 50 MG tablet TAKE 1/2 TABLETS (25 MG TOTAL) BY MOUTH 2 (TWO) TIMES DAILY. 90 tablet 1  .  omeprazole (PRILOSEC) 40 MG capsule Take 40 mg by mouth daily.    . Probiotic Product (PROBIOTIC PO) Take 1 capsule by mouth daily.     . Red Yeast Rice Extract (RED YEAST RICE PO) Take 1 capsule by mouth daily.     . simvastatin (ZOCOR) 80 MG tablet TAKE 1 TABLET BY MOUTH AT BEDTIME (Bianca [name] taking differently: TAKE 80 MG BY MOUTH AT BEDTIME) 30 tablet 4  . triamcinolone ointment (KENALOG) 0.1 % Apply to vulva externally bid prn. 30 g 0  . UNABLE TO FIND Take 1 capsule by mouth daily. OTC Eye vitamin    . UNABLE TO FIND Inject 1 mL into the skin every 14 (fourteen) days. b12 shots twice a month at MDs office    . zolpidem (AMBIEN) 5 MG tablet Take 1 tablet (5 mg total) by mouth at bedtime. 30 tablet 0   No current facility-administered medications for this visit.     ALLERGIES: Oxycodone hcl; Penicillins; Aspirin; Macrobid; Sulfa antibiotics; and Tramadol  Family History  Problem Relation Age of Onset  . Coronary artery disease Mother   . Diabetes Mother   . Hypertension Mother   . Dementia Father   . Sudden death Brother     Suicide  . Cancer Paternal Grandfather     Esophageal    Social History   Social History  . Marital Status: Married    Spouse Name: N/A  . Number of Children: N/A  . Years of Education: N/A   Occupational History  . Not on file.   Social History Main Topics  . Smoking status: Light Tobacco Smoker -- 0.50 packs/day for 50 years    Types: Cigarettes  . Smokeless tobacco: Never Used  . Alcohol Use: No  . Drug Use: No  . Sexual Activity: Not Currently    Birth Control/ Protection: Post-menopausal   Other Topics Concern  . Not on file   Social History Narrative   Is a homemaker   Married for 75 years    Has a daughter and son    Pets: Two dogs and a Neurosurgeon   Likes to garden ( flower beds).           ROS:  Pertinent items are noted in HPI.  PHYSICAL EXAMINATION:    BP 110/70 mmHg  Pulse 56  Ht 5\' 1"  (1.549 m)  Wt 161 lb (73.029 kg)  BMI  30.44 kg/m2  LMP  (LMP Unknown)    General appearance: alert, cooperative and appears stated age.   ASSESSMENT  Vaginal pain and burning.  Chronic.  I do not believe this is desquamative vaginitis.  Normal pelvic ultrasound.  Depression. Use of hypnotics.  Hx MI x 3.   PLAN  Discussion of normal pelvic ultrasound.  Pictures and report reviewed with Bianca [name].  We will proceed with empiric tx with Boric acid suppositories 200 mg per vagin q hs for 21 days.  She understands this can cause death if taken orally.  She will return in 1 month for a recheck.  I am hesitant regarding vaginal estrogen cream usage.  I have discussed referral to [facility] to the pelvic pain clinic.  Will hold off on this for now.    An After Visit Summary was printed and given to the Bianca [name].  __15____ minutes face to face time of which over 50% was spent in counseling.

## 2016-03-07 ENCOUNTER — Telehealth: Payer: Self-pay

## 2016-03-07 NOTE — Telephone Encounter (Signed)
This is not treatment for bacterial vaginosis.  Thank you.  Encounter closed.

## 2016-03-07 NOTE — Telephone Encounter (Signed)
Bianca [name] with [facility] wanted to verify strength of Boric Acid vaginal suppositories.  She states Rx has 200mg  and they stock 600mg  vaginal suppositories, but they can make 200mg  vaginal suppositories. Per Dr. Quincy Simmonds she would like patient to have Boric Acid 200mg  vaginal suppositories.  Rise Paganini states they will compound and notify patient when they are ready.

## 2016-03-11 ENCOUNTER — Telehealth: Payer: Self-pay | Admitting: Adult Health

## 2016-03-11 NOTE — Telephone Encounter (Signed)
Please advise 

## 2016-03-11 NOTE — Telephone Encounter (Signed)
Pt stated that she went to the pulmonologist that you referred her to and they stated it is acid reflux and it has gotten bad pt would like to be referred to a GI specialist.

## 2016-03-12 ENCOUNTER — Telehealth: Payer: Self-pay | Admitting: Adult Health

## 2016-03-12 ENCOUNTER — Other Ambulatory Visit: Payer: Self-pay | Admitting: Adult Health

## 2016-03-12 DIAGNOSIS — G4700 Insomnia, unspecified: Secondary | ICD-10-CM

## 2016-03-12 MED ORDER — PANTOPRAZOLE SODIUM 40 MG PO TBEC: 40.0000 mg | DELAYED_RELEASE_TABLET | Freq: Every day | ORAL | 3 refills | Status: DC

## 2016-03-12 NOTE — Telephone Encounter (Signed)
Spoke to [place] on the phone and she advised me that she saw Pulmonary and they thought that it was GERD that was causing her symptoms. She is taking Omeprazole 40mg  but reports that this is not working. She would like to go se GI.   I asked her to try Protonix daily for a few weeks inorder to see if that helps. If not, then I will send her to GI  She was agreeable to this

## 2016-03-12 NOTE — Telephone Encounter (Signed)
Pt would like cory to return her call concerning GI md

## 2016-03-13 ENCOUNTER — Other Ambulatory Visit: Payer: Self-pay | Admitting: Adult Health

## 2016-03-13 DIAGNOSIS — G4700 Insomnia, unspecified: Secondary | ICD-10-CM

## 2016-03-13 MED ORDER — ZOLPIDEM TARTRATE 5 MG PO TABS: 5.0000 mg | ORAL_TABLET | Freq: Every day | ORAL | 0 refills | Status: DC

## 2016-03-13 NOTE — Telephone Encounter (Signed)
See below

## 2016-03-13 NOTE — Telephone Encounter (Signed)
Ok to refill 

## 2016-03-14 ENCOUNTER — Other Ambulatory Visit: Payer: Self-pay | Admitting: Adult Health

## 2016-03-14 MED ORDER — DEXLANSOPRAZOLE 30 MG PO CPDR: 30.0000 mg | DELAYED_RELEASE_CAPSULE | Freq: Every day | ORAL | 3 refills | Status: DC

## 2016-03-14 NOTE — Telephone Encounter (Signed)
Spoke to [place] and she informed me that Protonix is not covered by her insurance.   Will write doe Dexilent 30mg  as there is a savings card for this.

## 2016-03-17 ENCOUNTER — Other Ambulatory Visit: Payer: Self-pay | Admitting: Internal Medicine

## 2016-03-19 ENCOUNTER — Other Ambulatory Visit: Payer: Self-pay | Admitting: Obstetrics and Gynecology

## 2016-03-19 ENCOUNTER — Telehealth: Payer: Self-pay | Admitting: Obstetrics and Gynecology

## 2016-03-19 ENCOUNTER — Other Ambulatory Visit: Payer: Self-pay | Admitting: Adult Health

## 2016-03-19 MED ORDER — NONFORMULARY OR COMPOUNDED ITEM: 0 refills | Status: DC

## 2016-03-19 NOTE — Telephone Encounter (Signed)
Patient requesting a refill of #5 Boric acid capsules. She dropped 5 in the toilet last night Please advise

## 2016-03-19 NOTE — Telephone Encounter (Signed)
Patient says a prescription for boric acid capsules should have been called in to [facility] [phone].

## 2016-03-19 NOTE — Telephone Encounter (Signed)
Patient accidentally dropped five of her boric acid capsules in the toilet last night. She needs to take this for 21 days and now she will need a refill for more.

## 2016-03-19 NOTE — Telephone Encounter (Signed)
Faxed Rx for Boric Acid suppositories 200mg  #5(five) with zero refills to [facility] [phone].

## 2016-03-19 NOTE — Telephone Encounter (Signed)
Notified patient that the boric acid tablets had been sent in -eh

## 2016-03-20 NOTE — Telephone Encounter (Signed)
Spoke with Mickel Baas at [facility] who states rx for Boric Acid capsules 200 mg suppositories in size zero gelatin capsule in vagina at hs for 21 nights was called in yesterday. States they have to make this dosage by hand as they only keep the 600 mg dosage in stock. The pharmacy will notify the patient when the rx is ready for pick up. Spoke with patient. Advised rx is at [facility] and she will be contacted once it is ready for pick up. She is agreeable.  Routing to provider for final review. Patient agreeable to disposition. Will close encounter.

## 2016-03-27 ENCOUNTER — Ambulatory Visit: Payer: Managed Care, Other (non HMO) | Admitting: Obstetrics and Gynecology

## 2016-03-27 ENCOUNTER — Telehealth: Payer: Self-pay | Admitting: Obstetrics and Gynecology

## 2016-03-27 ENCOUNTER — Encounter: Payer: Self-pay | Admitting: Obstetrics and Gynecology

## 2016-03-27 NOTE — Telephone Encounter (Signed)
Phone call returned to patient upon her request. "Is there anything that we can do to treat this vaginal burning?" I shared with patient the option for vaginal compounding medications to treat pain.  I advised her to call the office tomorrow to schedule an appointment.  She had an appointment today but apparently did not have a ride for her visit.

## 2016-03-27 NOTE — Telephone Encounter (Signed)
Returned call to patient. RN stated she received message that patient would like to reschedule appointment that she was unable to attend today. Patient states, "I would like to talk to Dr. Quincy Simmonds first." RN asked if there was anything she could assist patient with, but patient stated, "no, thank you for asking, but I would like to talk with Dr. Quincy Simmonds. I just need to ask her a question." RN stated she would send this message to Dr. Quincy Simmonds. Patient verbalized understanding.  Routing to provider for review.

## 2016-03-27 NOTE — Telephone Encounter (Addendum)
Patient cancelled her appointment today because she does not have a ride. She would like a nurse to call so she can reschedule if she need to.

## 2016-03-28 ENCOUNTER — Ambulatory Visit: Payer: Self-pay | Admitting: Obstetrics and Gynecology

## 2016-03-28 ENCOUNTER — Ambulatory Visit: Payer: Medicare Other | Admitting: Internal Medicine

## 2016-03-28 ENCOUNTER — Telehealth: Payer: Self-pay | Admitting: Obstetrics and Gynecology

## 2016-03-28 MED ORDER — LIDOCAINE 5 % EX OINT: 1.0000 "application " | TOPICAL_OINTMENT | Freq: Every day | CUTANEOUS | 0 refills | Status: DC | PRN

## 2016-03-28 NOTE — Telephone Encounter (Signed)
Please see encounter from 03/27/2016. Will close encounter.

## 2016-03-28 NOTE — Telephone Encounter (Signed)
Patient called requesting a return call from Dr Quincy Simmonds regarding on going vaginal burning.    Routing to Triage for review

## 2016-03-28 NOTE — Telephone Encounter (Signed)
I would recommend Lidocaine 5% jelly to vulva once daily as needed.  Dispense one tube, RF none. This may have some positive effect on the vaginal discomfort.

## 2016-03-28 NOTE — Addendum Note (Signed)
Addended by: Lowella Fairy on: 03/28/2016 11:16 AM   Modules accepted: Orders

## 2016-03-28 NOTE — Telephone Encounter (Signed)
Office visit entered in error for 03/28/16 at 1430 instead of 03/31/16. Appointment for 03/28/16 canceled and RN called patient to see if she could come 03/31/16 at 1515. Patient agreeable to that time. Appointment now scheduled for 03/31/16 at 1515.

## 2016-03-28 NOTE — Telephone Encounter (Signed)
Called patient and notified Rx for Lidocaine 5% ointment sent to pharmacy. Patient to keep appointment for Monday.

## 2016-03-28 NOTE — Telephone Encounter (Signed)
I called patient back and recommended an office visit.  I have looked at the schedule for today, and I am recommending a visit on Monday, 03/31/16. Patient has chronic vaginal pain.   Cc- Bianca [name]

## 2016-03-28 NOTE — Telephone Encounter (Signed)
Called patient to help schedule appointment for Monday. Patient states "the later the better." Office visit scheduled for Monday 03/31/2016 at 1430. Patient agreeable to date and time.   Patient also requesting again to speak with Dr. Quincy Simmonds. RN stated to patient that Dr. Quincy Simmonds is seeing patients today and RN could help facilitate a quicker response to patient's question. Patient states she would like Dr. Quincy Simmonds to call her something in because she states, "I can't endure the weekend without something." [facility] [address]. Patient again requesting to speak with Dr. Quincy Simmonds. RN again reiterated that Dr. Quincy Simmonds is seeing patients today. Patient aware and still requesting RN let Dr. Quincy Simmonds know she would like to be called. RN stated she would send this to Dr. Quincy Simmonds. Patient aware she might not get an immediate response.   Routing to provider for review.

## 2016-03-31 ENCOUNTER — Ambulatory Visit: Payer: Medicare Other | Admitting: Pulmonary Disease

## 2016-03-31 ENCOUNTER — Ambulatory Visit: Payer: Medicare Other | Admitting: Internal Medicine

## 2016-03-31 ENCOUNTER — Ambulatory Visit (INDEPENDENT_AMBULATORY_CARE_PROVIDER_SITE_OTHER): Payer: Managed Care, Other (non HMO) | Admitting: Obstetrics and Gynecology

## 2016-03-31 ENCOUNTER — Ambulatory Visit: Payer: Self-pay | Admitting: Obstetrics and Gynecology

## 2016-03-31 VITALS — BP 118/64 | HR 60 | Resp 12 | Ht 61.0 in | Wt 158.2 lb

## 2016-03-31 DIAGNOSIS — N949 Unspecified condition associated with female genital organs and menstrual cycle: Secondary | ICD-10-CM | POA: Diagnosis not present

## 2016-03-31 MED ORDER — NONFORMULARY OR COMPOUNDED ITEM: 1 refills | Status: DC

## 2016-03-31 NOTE — Progress Notes (Signed)
GYNECOLOGY  VISIT   HPI: 70 y.o.   Married  Caucasian  female   G2P2 with No LMP recorded (lmp unknown). Patient is postmenopausal.   here for chronic vaginal burning. Patient states went to urgent care over weekend for severe burning and urine culture sent and was told positive for bacteria. Patient started antibiotics today.    Treating with Ciprofloxacin.   States that pain meds are the only thing that helped the vaginal pain.  States hydromorphone is what helped this the best.  She reports that when she stopped taking this narcotic, she developed the vaginal pain.  Used vaginal estrogen cream in the past.   Hx of 3 MIs.  Sees Dr. Louretta Shorten at [facility] for her medication.   GYNECOLOGIC HISTORY: No LMP recorded (lmp unknown). Patient is postmenopausal. Contraception:  postmenopausal Menopausal hormone therapy:  none Last mammogram: 01/05/2014 category 3 prob benign, 01/24/15 negative per patient,  Last pap smear:   01/10/14 Neg. No Abnormal paps.         OB History    Gravida Para Term Preterm AB Living   2 2       2    SAB TAB Ectopic Multiple Live Births                     Patient Active Problem List   Diagnosis Date Noted  . Dyspnea 02/20/2016  . Hyponatremia 01/27/2016  . Bradycardia 01/27/2016  . B12 deficiency 07/11/2015  . Anemia associated with acute blood loss 05/28/2014  . Cigarette smoker 05/28/2014  . OA (osteoarthritis) of hip 05/24/2014  . Chest pain 12/19/2013  . NSTEMI (non-ST elevated myocardial infarction) 05/18/2012  . Allergic rhinitis due to pollen 04/01/2012  . Back pain 11/22/2010  . NEOPLASM OF UNCERTAIN BEHAVIOR OF SKIN 06/05/2010  . GERD 07/30/2009  . Hyperlipidemia 03/02/2007  . Depression 03/02/2007  . Coronary atherosclerosis 03/02/2007    Past Medical History:  Diagnosis Date  . Arthritis   . Blood transfusion without reported diagnosis   . CAD (coronary artery disease)    a. 1997 MI/PCI RCA;  b. 2007 MI/PCI of  100% RCA with Taxus DES x 3 placed, EF 55%;  c. 2010 Cath: stable anatomy;  d. 05/2012 NSTEMI/Cath/PCI: LM nl, LAD 60-30m, D1 20ost, LCX 85m, RCA 40-56m ISR, 60/95d (Treated w/ 2.75x33 Xience Xpedition DES), PDA 66m (Treated w/ PTCA), EF 60%.  . Carpal tunnel syndrome on both sides   . Chronic lower back pain   . Depression   . GERD (gastroesophageal reflux disease)   . Hyperlipidemia   . Myocardial infarction [facility]) 1997, 2007, 2013  . Numbness of foot    Left foot - back surgery 2009    Past Surgical History:  Procedure Laterality Date  . ANKLE SURGERY  Years ago   Left; "had to take out a floater"  . CARPAL TUNNEL RELEASE Left 10/12016  . CESAREAN SECTION  1990  . CORONARY ANGIOPLASTY WITH STENT PLACEMENT  1997; 2007, 2013   "2 + 2; + 1= total of 5  . LEFT HEART CATHETERIZATION WITH CORONARY ANGIOGRAM N/A 05/18/2012   Procedure: LEFT HEART CATHETERIZATION WITH CORONARY ANGIOGRAM;  Surgeon: Wellington Hampshire, MD;  Location: [facility];  Service: Cardiovascular;  Laterality: N/A;  . [place]; 2009  . TOTAL HIP ARTHROPLASTY Right 05/24/2014   Procedure: RIGHT TOTAL HIP ARTHROPLASTY ANTERIOR APPROACH;  Surgeon: Gearlean Alf, MD;  Location: [facility];  Service: Orthopedics;  Laterality: Right;    Current Outpatient Prescriptions  Medication Sig Dispense Refill  . buPROPion (WELLBUTRIN XL) 150 MG 24 hr tablet     . ciprofloxacin (CIPRO) 500 MG tablet     . clonazePAM (KLONOPIN) 0.5 MG tablet Take 1 tablet (0.5 mg total) by mouth 2 (two) times daily as needed (anxiety). 10 tablet 0  . clopidogrel (PLAVIX) 75 MG tablet TAKE 1 TABLET BY MOUTH ONCE DAILY 30 tablet 0  . [facility] NASAL ALLERGY SPRAY 55 MCG/ACT AERO nasal inhaler USE 1 SPRAY IN EACH NOSTRIL EVERY DAY AT NIGHT  0  . Dexlansoprazole 30 MG capsule Take 1 capsule (30 mg total) by mouth daily. 30 capsule 3  . FLUoxetine (PROZAC) 40 MG capsule     . Homeopathic Products (HYLAFEM) SUPP Place 1 suppository  vaginally at bedtime. 6 suppository 0  . hydrOXYzine (ATARAX/VISTARIL) 25 MG tablet Reported on 02/22/2016    . metoprolol (LOPRESSOR) 50 MG tablet TAKE 1/2 TABLETS (25 MG TOTAL) BY MOUTH 2 (TWO) TIMES DAILY. 90 tablet 0  . NONFORMULARY OR COMPOUNDED ITEM Place 200 mg boric acid suppository in size zero gelatin capsule in vagina at hs for 21 nights. 5 each 0  . Probiotic Product (PROBIOTIC PO) Take 1 capsule by mouth daily.     . Red Yeast Rice Extract (RED YEAST RICE PO) Take 1 capsule by mouth daily.     . simvastatin (ZOCOR) 80 MG tablet TAKE 1 TABLET BY MOUTH AT BEDTIME (Patient taking differently: TAKE 80 MG BY MOUTH AT BEDTIME) 30 tablet 4  . triamcinolone ointment (KENALOG) 0.1 % Apply to vulva externally bid prn. 30 g 0  . UNABLE TO FIND Take 1 capsule by mouth daily. OTC Eye vitamin    . UNABLE TO FIND Inject 1 mL into the skin every 14 (fourteen) days. b12 shots twice a month at MDs office    . zolpidem (AMBIEN) 5 MG tablet Take 1 tablet (5 mg total) by mouth at bedtime. 30 tablet 0   No current facility-administered medications for this visit.      ALLERGIES: Oxycodone hcl; Penicillins; Aspirin; Macrobid [nitrofurantoin monohyd macro]; Sulfa antibiotics; and Tramadol  Family History  Problem Relation Age of Onset  . Coronary artery disease Mother   . Diabetes Mother   . Hypertension Mother   . Dementia Father   . Sudden death Brother     Suicide  . Cancer Paternal Grandfather     Esophageal    Social History   Social History  . Marital status: Married    Spouse name: N/A  . Number of children: N/A  . Years of education: N/A   Occupational History  . Not on file.   Social History Main Topics  . Smoking status: Light Tobacco Smoker    Packs/day: 0.50    Years: 50.00    Types: Cigarettes  . Smokeless tobacco: Never Used  . Alcohol use No  . Drug use: No  . Sexual activity: Not Currently    Birth control/ protection: Post-menopausal   Other Topics Concern  .  Not on file   Social History Narrative   Is a homemaker   Married for 34 years    Has a daughter and son    Pets: Two dogs and a Neurosurgeon   Likes to garden ( flower beds).           ROS:  Pertinent items are noted in HPI.  PHYSICAL EXAMINATION:    BP 118/64 (BP Location:  Right Arm, Patient Position: Sitting, Cuff Size: Normal)   Pulse 60   Resp 12   Ht 5\' 1"  (1.549 m)   Wt 158 lb 3.2 oz (71.8 kg)   LMP  (LMP Unknown)   BMI 29.89 kg/m     General appearance: alert, cooperative and appears stated age  Pelvic: External genitalia:  no lesions.  Tender to palpation of the hymen.               Urethra:  normal appearing urethra with no masses, tenderness or lesions              Bartholins and Skenes: normal                 Vagina: normal appearing vagina with normal color and discharge, no lesions              Cervix: no lesions                Bimanual Exam:  Uterus:  normal size, contour, position, consistency, mobility, non-tender              Adnexa: no mass, fullness, tenderness               Chaperone was present for exam.  ASSESSMENT  Chronic vaginal pain.  Current UTI. Hx MI x 3. Depression.  PLAN  Discussed vulvodynia. Discussed with patient local Gabapentin treatment.  Will have this compounded as Gabapentin 6% in versa base, 2 ml to vagina nightly, x 2 months.  Discussed side effects.  Follow up in 4 weeks and prn.     An After Visit Summary was printed and given to the patient.  _25_____ minutes face to face time of which over 50% was spent in counseling.

## 2016-03-31 NOTE — Patient Instructions (Addendum)
Dr. Caprice Beaver or Dr. Toy Care are psychiatrists who may be of interest to you.   Gabapentin capsules or tablets What is this medicine? GABAPENTIN (GA ba pen tin) is used to control partial seizures in adults with epilepsy. It is also used to treat certain types of nerve pain. This medicine may be used for other purposes; ask your health care provider or pharmacist if you have questions. What should I tell my health care provider before I take this medicine? They need to know if you have any of these conditions: -kidney disease -suicidal thoughts, plans, or attempt; a previous suicide attempt by you or a family member -an unusual or allergic reaction to gabapentin, other medicines, foods, dyes, or preservatives -pregnant or trying to get pregnant -breast-feeding How should I use this medicine? Take this medicine by mouth with a glass of water. Follow the directions on the prescription label. You can take it with or without food. If it upsets your stomach, take it with food.Take your medicine at regular intervals. Do not take it more often than directed. Do not stop taking except on your doctor's advice. If you are directed to break the 600 or 800 mg tablets in half as part of your dose, the extra half tablet should be used for the next dose. If you have not used the extra half tablet within 28 days, it should be thrown away. A special MedGuide will be given to you by the pharmacist with each prescription and refill. Be sure to read this information carefully each time. Talk to your pediatrician regarding the use of this medicine in children. Special care may be needed. Overdosage: If you think you have taken too much of this medicine contact a poison control center or emergency room at once. NOTE: This medicine is only for you. Do not share this medicine with others. What if I miss a dose? If you miss a dose, take it as soon as you can. If it is almost time for your next dose, take only that dose. Do  not take double or extra doses. What may interact with this medicine? Do not take this medicine with any of the following medications: -other gabapentin products This medicine may also interact with the following medications: -alcohol -antacids -antihistamines for allergy, cough and cold -certain medicines for anxiety or sleep -certain medicines for depression or psychotic disturbances -homatropine; hydrocodone -naproxen -narcotic medicines (opiates) for pain -phenothiazines like chlorpromazine, mesoridazine, prochlorperazine, thioridazine This list may not describe all possible interactions. Give your health care provider a list of all the medicines, herbs, non-prescription drugs, or dietary supplements you use. Also tell them if you smoke, drink alcohol, or use illegal drugs. Some items may interact with your medicine. What should I watch for while using this medicine? Visit your doctor or health care professional for regular checks on your progress. You may want to keep a record at home of how you feel your condition is responding to treatment. You may want to share this information with your doctor or health care professional at each visit. You should contact your doctor or health care professional if your seizures get worse or if you have any new types of seizures. Do not stop taking this medicine or any of your seizure medicines unless instructed by your doctor or health care professional. Stopping your medicine suddenly can increase your seizures or their severity. Wear a medical identification bracelet or chain if you are taking this medicine for seizures, and carry a card that lists  all your medications. You may get drowsy, dizzy, or have blurred vision. Do not drive, use machinery, or do anything that needs mental alertness until you know how this medicine affects you. To reduce dizzy or fainting spells, do not sit or stand up quickly, especially if you are an older patient. Alcohol can  increase drowsiness and dizziness. Avoid alcoholic drinks. Your mouth may get dry. Chewing sugarless gum or sucking hard candy, and drinking plenty of water will help. The use of this medicine may increase the chance of suicidal thoughts or actions. Pay special attention to how you are responding while on this medicine. Any worsening of mood, or thoughts of suicide or dying should be reported to your health care professional right away. Women who become pregnant while using this medicine may enroll in the [place] Pregnancy Registry by calling [phone]. This registry collects information about the safety of antiepileptic drug use during pregnancy. What side effects may I notice from receiving this medicine? Side effects that you should report to your doctor or health care professional as soon as possible: -allergic reactions like skin rash, itching or hives, swelling of the face, lips, or tongue -worsening of mood, thoughts or actions of suicide or dying Side effects that usually do not require medical attention (report to your doctor or health care professional if they continue or are bothersome): -constipation -difficulty walking or controlling muscle movements -dizziness -nausea -slurred speech -tiredness -tremors -weight gain This list may not describe all possible side effects. Call your doctor for medical advice about side effects. You may report side effects to FDA at 1-800-FDA-1088. Where should I keep my medicine? Keep out of reach of children. This medicine may cause accidental overdose and death if it taken by other adults, children, or pets. Mix any unused medicine with a substance like cat litter or coffee grounds. Then throw the medicine away in a sealed container like a sealed bag or a coffee can with a lid. Do not use the medicine after the expiration date. Store at room temperature between 15 and 30 degrees C (59 and 86 degrees F). NOTE: This  sheet is a summary. It may not cover all possible information. If you have questions about this medicine, talk to your doctor, pharmacist, or health care provider.    2016, Elsevier/Gold Standard. (2013-09-30 15:26:50)

## 2016-04-01 ENCOUNTER — Telehealth: Payer: Self-pay | Admitting: Obstetrics and Gynecology

## 2016-04-01 ENCOUNTER — Ambulatory Visit (INDEPENDENT_AMBULATORY_CARE_PROVIDER_SITE_OTHER): Payer: Managed Care, Other (non HMO) | Admitting: Adult Health

## 2016-04-01 ENCOUNTER — Encounter: Payer: Self-pay | Admitting: Adult Health

## 2016-04-01 DIAGNOSIS — J45909 Unspecified asthma, uncomplicated: Secondary | ICD-10-CM | POA: Insufficient documentation

## 2016-04-01 DIAGNOSIS — J4521 Mild intermittent asthma with (acute) exacerbation: Secondary | ICD-10-CM

## 2016-04-01 MED ORDER — PREDNISONE 10 MG PO TABS: ORAL_TABLET | ORAL | 0 refills | Status: DC

## 2016-04-01 MED ORDER — NONFORMULARY OR COMPOUNDED ITEM: 0 refills | Status: DC

## 2016-04-01 NOTE — Telephone Encounter (Signed)
Dr.Silva, I see that rx for Gabapentin 6 % in versa base. Place 1 ml vaginally at hs #30 ml was printed. [facility] need [facility] be [facility] to [facility] for compounding. Will this rx need to be reprinted for faxing? Rx not in back nursing station.

## 2016-04-01 NOTE — Telephone Encounter (Signed)
Thank you.   Cc- Dr. Sabra Heck

## 2016-04-01 NOTE — Telephone Encounter (Signed)
Patient says her prescription was not called in yesterday [facility] at [phone].

## 2016-04-01 NOTE — Telephone Encounter (Signed)
Spoke with patient. Advised rx for Gabapentin 6% in [place] has been faxed to [facility] with confirmation. She is agreeable.  Cc: Dr.Silva  Routing to covering provider for final review. Patient agreeable to disposition. Will close encounter.

## 2016-04-01 NOTE — Patient Instructions (Signed)
Prednisone taper over next week.  Begin Zyrtec 10mg  At bedtime   Use sips of water to soothe throat to prevent throat clearing and coughing .  Continue on Dexilant daily before meal .  Add Pepcid 20mg  At bedtime   Follow up with Dr. Melvyn Novas  In 6 weeks and As needed   Please contact office for sooner follow up if symptoms do not improve or worsen or seek emergency care

## 2016-04-01 NOTE — Telephone Encounter (Signed)
Call to [facility] rx for Gabapentin has not been received by their pharmacy. New rx written and to [place] for review and signature for sending as Dr.Silva is out of the office today.

## 2016-04-01 NOTE — Progress Notes (Signed)
Chart and office note reviewed in detail  > agree with a/p as outlined    

## 2016-04-01 NOTE — Assessment & Plan Note (Signed)
?  RAD with AR /GERD triggers  Spirometry nml  Labs are unrevealing with neg RAST/IgE , BNP  Will control for triggers .   Plan  Prednisone taper over next week.  Begin Zyrtec 10mg  At bedtime   Use sips of water to soothe throat to prevent throat clearing and coughing .  Continue on Dexilant daily before meal .  Add Pepcid 20mg  At bedtime   Follow up with Dr. Melvyn Novas  In 6 weeks and As needed   Please contact office for sooner follow up if symptoms do not improve or worsen or seek emergency care

## 2016-04-01 NOTE — Progress Notes (Signed)
Subjective:    Patient ID: Bianca [name], female    DOB: 02-15-1946,   MRN: [id number]  HPI  66 yowf active smoker with h/o sinus symptoms mostly in spring ever since moving to [facility] around 1970s from [place] with ENT neg previous but never allergy eval  with new onset chest tightness and sob x mid June 2017 > 2 trips to ER with neg Cardiac eval referred to pulmonary clinic 02/20/2016 by [name].   02/20/2016 1st [facility] office visit/ W[facility]  Onset of symptoms was late March 2017 starting with "sinus infection" Chief Complaint  Patient presents with  . Pulmonary Consult    Referred by [name]. Pt c/o chest tightness and SOB for the past 6 wks- she believes that this is due to mold exposure. She states chest tightness comes and goes, but bothers her more when she lies down. She notices SOB "maybe when I rush up some steps".    on ppi x sev years takes 10 before supper  When arrived ER 12/25/15 cc "sob /chest heavy x awhile" then x  4 days much worse assoc with cough productive with green mucus and assoc with nausea and stated  "her doctors kept saying it's bronchitis but is not getting any better" > rec doxy > cough resolved but not the chest tightness or sob and intermittently having dysphagia since onset of symptoms and overt anxiety/depression  Symptoms are the same since ER x cough better, no variability x worse at hs consistently the same and doe x fast walk or steps.  Does not bother her once asleep >>  04/01/2016 Acute OV :  Patient presents for an acute office visit Patient complains of over last 6 months of post nasal drainage, tickle in her throat and dry cough .  Says main issue with drainage in throat. Has very little cough. Drainage is so thick in throat it makes her sick. Patient was seen for pulmonary consult 02/20/2016 for chronic cough. She was started on GERD treatment. Started on PPI Twice daily  And GERD diet.  Chest x-ray in June 2017 showed clear lungs.. She  misunderstood instructions. She did not change her dexilant from daily dosing.  Allergy profile was negative with IgE at 7. Sedimentation rate , TSH and BNP were normal. CBC in June 2017 showed normal eosinophils.  She denies any hemoptysis, chest pain, orthopnea, PND, or increased leg swelling. Spriometry was normal .    Current Medications, Allergies, Complete Past Medical History, Past Surgical History, Family History, and Social History were reviewed in [employer] record.            Review of Systems  Constitutional:   No  weight loss, night sweats,  Fevers, chills, fatigue, or  lassitude.  HEENT:   No headaches,  Difficulty swallowing,  Tooth/dental problems, or  Sore throat,                No sneezing, itching, ear ache,  +nasal congestion, post nasal drip,   CV:  No chest pain,  Orthopnea, PND, swelling in lower extremities, anasarca, dizziness, palpitations, syncope.   GI  No heartburn, indigestion, abdominal pain, nausea, vomiting, diarrhea, change in bowel habits, loss of appetite, bloody stools.   Resp:   No chest wall deformity  Skin: no rash or lesions.  GU: no dysuria, change in color of urine, no urgency or frequency.  No flank pain, no hematuria   MS:  No joint pain or swelling.  No decreased range of motion.  No back pain.  Psych:  No change in mood or affect. No depression or anxiety.  No memory loss.         Objective:   Physical Exam  amb wf nad  Vitals:   04/01/16 1415  BP: 132/76  Pulse: 75  SpO2: 96%    Vital signs reviewed   HEENT: nl dentition, turbinates, and oropharynx. Nl external ear canals without cough reflex   NECK :  without JVD/Nodes/TM/ nl carotid upstrokes bilaterally   LUNGS: no acc muscle use,  Nl contour chest which is clear to A and P bilaterally without cough on insp or exp maneuvers   CV:  RRR  no s3 or murmur or increase in P2, no edema   ABD:  soft and nontender with nl inspiratory  excursion in the supine position. No bruits or organomegaly, bowel sounds nl  MS:  Nl gait/ ext warm without deformities, calf tenderness, cyanosis or clubbing No obvious joint restrictions   SKIN: warm and dry without lesions    NEURO:  alert, approp, nl sensorium with  no motor deficits      CXR:  01/26/16  No active cardiopulmonary disease.    Labs ordered/ reviewed:      Chemistry      Component Value Date/Time   NA 135 02/09/2016 1042   K 3.7 02/09/2016 1042   CL 102 02/09/2016 1042   CO2 24 02/09/2016 1042   BUN <5* 02/09/2016 1042   CREATININE 0.69 02/09/2016 1042      Component Value Date/Time   CALCIUM 9.4 02/09/2016 1042   ALKPHOS 90 02/09/2016 1042   AST 22 02/09/2016 1042   ALT 11* 02/09/2016 1042   BILITOT 0.9 02/09/2016 1042        Lab Results  Component Value Date   WBC 7.2 02/09/2016   HGB 12.9 02/09/2016   HCT 35.2* 02/09/2016   MCV 85.4 02/09/2016   PLT 354 02/09/2016         Lab Results  Component Value Date   TSH 3.73 02/20/2016     Lab Results  Component Value Date   PROBNP 79.0 02/20/2016       Lab Results  Component Value Date   ESRSEDRATE 27 02/20/2016   ESRSEDRATE 16 12/28/2009       Yenesis Even NP-C  [facility]  04/01/2016

## 2016-04-04 ENCOUNTER — Ambulatory Visit: Payer: Managed Care, Other (non HMO) | Admitting: Obstetrics and Gynecology

## 2016-04-04 ENCOUNTER — Encounter: Payer: Self-pay | Admitting: Obstetrics and Gynecology

## 2016-04-11 ENCOUNTER — Telehealth: Payer: Self-pay | Admitting: Internal Medicine

## 2016-04-11 NOTE — Telephone Encounter (Signed)
Called and spoke to pt. Pt states she wants to be sure it is ok for her to take prednisone because of a past MI in 2013. Advised pt that TP is aware of her history and would not have given medication without consideration of the risks. Pt verbalized understanding and denied any further questions or concerns at this time.

## 2016-04-14 ENCOUNTER — Other Ambulatory Visit: Payer: Self-pay | Admitting: Family Medicine

## 2016-04-14 DIAGNOSIS — G4700 Insomnia, unspecified: Secondary | ICD-10-CM

## 2016-04-15 NOTE — Telephone Encounter (Signed)
Ok to refill for 30 days  

## 2016-04-15 NOTE — Telephone Encounter (Signed)
Rx called in as directed.   

## 2016-04-17 ENCOUNTER — Encounter: Payer: Self-pay | Admitting: Pulmonary Disease

## 2016-04-17 ENCOUNTER — Other Ambulatory Visit (INDEPENDENT_AMBULATORY_CARE_PROVIDER_SITE_OTHER): Payer: Managed Care, Other (non HMO)

## 2016-04-17 ENCOUNTER — Ambulatory Visit (INDEPENDENT_AMBULATORY_CARE_PROVIDER_SITE_OTHER): Payer: Managed Care, Other (non HMO) | Admitting: Pulmonary Disease

## 2016-04-17 VITALS — BP 122/78 | HR 76 | Ht 61.0 in | Wt 155.0 lb

## 2016-04-17 DIAGNOSIS — R221 Localized swelling, mass and lump, neck: Secondary | ICD-10-CM | POA: Diagnosis not present

## 2016-04-17 DIAGNOSIS — R1310 Dysphagia, unspecified: Secondary | ICD-10-CM

## 2016-04-17 NOTE — Assessment & Plan Note (Signed)
Patient is here for persistence of her issues. Unfortunately, it is hard to tease out her issues. She said, since March of this year, she has had problems swallowing, substernal chest pain, dyspnea. Not sure if dyspnea is separate from the swallowing issues. She has noticed her swallowing has gotten worse. If she just water, she is fine. If she drinks soda, she starts having chest pain. She also noticed difficulty swallowing solid foods. Symptoms are progressive.  Cardiac workup has been unremarkable. PFT and allergy profile have been unremarkable. Symptoms are persistent despite being on Dexilant in am and Pepcid at HS.   No GI workup was been done. Plan for modified Ba swallow.

## 2016-04-17 NOTE — Progress Notes (Signed)
Subjective:    Patient ID: Bianca [name], female    DOB: 08-03-1946, 70 y.o.   MRN: [id number]  HPI  ROV 04/17/16 patient is here urgently for throat pain/problem swallowing/pleuritic cp/dyspnea. Patient was seen by Dr. Melvyn Novas in July as well as Tammy Parrett in August for dyspnea. She was kept on the [place]  in the morning and started Pepcid at bedtime. She was also placed on prednisone taper 2 weeks ago. She is back here because she is not better. She's had these issues since March of this year. She feels the problems swallowing has slowly gotten worse. Difficulty with eating solids, feels that food gets stuck in her esophagus. She also has "throat fullness" and feels she has a "lump in her throat". She has been admitted several times for this issue. Allegedly, her cardiac issue has been unremarkable. Her PFT, allergy testing are all within normal.   Review of Systems  Constitutional: Negative.   HENT: Positive for congestion, postnasal drip, rhinorrhea, sinus pressure, sore throat and trouble swallowing.   Eyes: Negative.   Respiratory: Positive for choking and shortness of breath.   Cardiovascular: Negative.   Gastrointestinal: Negative.   Endocrine: Negative.   Genitourinary: Negative.   Musculoskeletal: Negative.   Allergic/Immunologic: Negative.   Neurological: Negative.   Hematological: Negative.   Psychiatric/Behavioral: Negative.   All other systems reviewed and are negative.      Objective:   Physical Exam Vitals:  Vitals:   04/17/16 1615  BP: 122/78  Pulse: 76  SpO2: 95%  Weight: 155 lb (70.3 kg)  Height: 5\' 1"  (1.549 m)    Constitutional/General:  Pleasant, well-nourished, well-developed, not in any distress,  Comfortably seating.  Well kempt  Body mass index is 29.29 kg/m. Wt Readings from Last 3 Encounters:  04/17/16 155 lb (70.3 kg)  04/01/16 156 lb (70.8 kg)  03/31/16 158 lb 3.2 oz (71.8 kg)     HEENT: Pupils equal and reactive to light and  accommodation. Anicteric sclerae. Normal nasal mucosa.   No oral  lesions,  mouth clear,  oropharynx clear, no postnasal drip. (-) Oral thrush. No dental caries.  Airway - Mallampati class III  Neck: No masses. Midline trachea. No JVD, (-) LAD. (-) bruits appreciated.  Respiratory/Chest: Grossly normal chest. (-) deformity. (-) Accessory muscle use.  Symmetric expansion. (-) Tenderness on palpation.  Resonant on percussion.  Diminished BS on both lower lung zones. (-) wheezing, crackles, rhonchi (-) egophony  Cardiovascular: Regular rate and  rhythm, heart sounds normal, no murmur or gallops, no peripheral edema  Gastrointestinal:  Normal bowel sounds. Soft, non-tender. No hepatosplenomegaly.  (-) masses.   Musculoskeletal:  Normal muscle tone. Normal gait.   Extremities: Grossly normal. (-) clubbing, cyanosis.  (-) edema  Skin: (-) rash,lesions seen.   Neurological/Psychiatric : alert, oriented to time, place, person. Normal mood and affect           Assessment & Plan:  Swallowing difficulty Patient is here for persistence of her issues. Unfortunately, it is hard to tease out her issues. She said, since March of this year, she has had problems swallowing, substernal chest pain, dyspnea. Not sure if dyspnea is separate from the swallowing issues. She has noticed her swallowing has gotten worse. If she just water, she is fine. If she drinks soda, she starts having chest pain. She also noticed difficulty swallowing solid foods. Symptoms are progressive.  Cardiac workup has been unremarkable. PFT and allergy profile have been unremarkable. Symptoms are persistent  despite being on Dexilant in am and Pepcid at HS.   No GI workup was been done. Plan for modified Ba swallow.    Lump in throat Pt also complains of "throat lump" on top of her GI sx. Persistent. Also with weight loss.  Plan for neck ct scan with contrast.   Return to clinic with Dr. Melvyn Novas as scheduled.   I  spent at least 25 minutes with the patient today and more than 50% was spent counseling the patient regarding disease and management and facilitating labs and medications.         Monica Becton, MD 04/17/2016, 4:53 PM [facility] Pager [phone] After 3 pm or if no answer, call [phone]

## 2016-04-17 NOTE — Patient Instructions (Signed)
It was a pleasure taking care of you today!  We will schedule you for a swallow examination as well as a CAT scan of her neck. We will do blood work today. We will call you with results.  Return to clinic on 9/26 with Dr. Melvyn Novas.

## 2016-04-17 NOTE — Assessment & Plan Note (Signed)
Pt also complains of "throat lump" on top of her GI sx. Persistent. Also with weight loss.  Plan for neck ct scan with contrast.

## 2016-04-18 LAB — BASIC METABOLIC PANEL
BUN: 10 mg/dL (ref 6–23)
CHLORIDE: 99 meq/L (ref 96–112)
CO2: 28 mEq/L (ref 19–32)
Calcium: 9.4 mg/dL (ref 8.4–10.5)
Creatinine, Ser: 0.86 mg/dL (ref 0.40–1.20)
GFR: 69.27 mL/min (ref >60.00)
Glucose, Bld: 98 mg/dL (ref 70–99)
POTASSIUM: 4 meq/L (ref 3.5–5.1)
SODIUM: 135 meq/L (ref 135–145)

## 2016-04-18 NOTE — Progress Notes (Signed)
Called spoke with pt. Reviewed results and recs. Pt voiced understanding and had no further questions.

## 2016-04-25 ENCOUNTER — Other Ambulatory Visit (HOSPITAL_COMMUNITY): Payer: Self-pay | Admitting: Pulmonary Disease

## 2016-04-25 DIAGNOSIS — R1310 Dysphagia, unspecified: Secondary | ICD-10-CM

## 2016-04-29 ENCOUNTER — Ambulatory Visit (HOSPITAL_COMMUNITY): Admission: RE | Admit: 2016-04-29 | Payer: Medicare Other | Source: Ambulatory Visit

## 2016-04-29 ENCOUNTER — Telehealth: Payer: Self-pay | Admitting: Pulmonary Disease

## 2016-04-29 NOTE — Telephone Encounter (Signed)
Patient states that she has called several times to reschedule her barium swallow that Dr. Melvyn Novas ordered, but she has not been able to get through.  She would like assistance in getting this rescheduled.  [facility] - can you help with this?

## 2016-04-29 NOTE — Telephone Encounter (Signed)
Requesting an appointment for this Friday if possible, 09/15.  CB [phone].

## 2016-04-30 ENCOUNTER — Ambulatory Visit (INDEPENDENT_AMBULATORY_CARE_PROVIDER_SITE_OTHER)
Admission: RE | Admit: 2016-04-30 | Discharge: 2016-04-30 | Disposition: A | Discharge status: Home / Self Care | Payer: Managed Care, Other (non HMO) | Source: Ambulatory Visit | Attending: Pulmonary Disease | Admitting: Pulmonary Disease

## 2016-04-30 DIAGNOSIS — R1310 Dysphagia, unspecified: Secondary | ICD-10-CM

## 2016-04-30 MED ORDER — IOPAMIDOL (ISOVUE-300) INJECTION 61%
75.0000 mL | Freq: Once | INTRAVENOUS | Status: AC | PRN
  Administered 2016-04-30: 75 mL via INTRAVENOUS

## 2016-04-30 NOTE — Telephone Encounter (Signed)
I called pt to help her get MBS rescheduled & she states she has just gotten it rescheduled.  Nothing further needed.

## 2016-05-01 ENCOUNTER — Telehealth: Payer: Self-pay | Admitting: Internal Medicine

## 2016-05-01 NOTE — Telephone Encounter (Signed)
Called spoke with pt. She is requesting to get her barium swallowing test scheduled sooner than 05/13/16. Please advise PCC's thanks

## 2016-05-02 ENCOUNTER — Telehealth: Payer: Self-pay | Admitting: *Deleted

## 2016-05-02 NOTE — Telephone Encounter (Signed)
Called spoke with pt. Aware of below. She reports she has not heard from anyone regarding getting her test done ASAP. Please advise PCC's if you can assist in this? thanks

## 2016-05-02 NOTE — Telephone Encounter (Signed)
Note canceled MBS, needs asap but nothing to offer in meantime

## 2016-05-02 NOTE — Telephone Encounter (Signed)
Pt calling in and is wanting to know if she is able to take anything for the "knot feeling" in her throat. She is scheduled for BMP on 9/26 but is trying to get this scheduled sooner. Please advise MW thanks

## 2016-05-02 NOTE — Telephone Encounter (Signed)
I had spoken to Corinne Ports at [facility] earlier today & she said she was going to check with a co-worker about how to reschedule this and then would call pt to get her appt moved up.  They have already left for today.  I called pt back & gave her their phone # & told her she can call them first thing Monday morning & ask about moving up appt.  I told her I would check on it Monday & if it hasn't been moved up I will call them & call her back.  She was appreciative & said that would be fine.

## 2016-05-02 NOTE — Telephone Encounter (Signed)
Spoke to Bianca [name] in [facility] & she said they will call pt & get her appt moved up.  I called pt & made her aware they would be calling her.  Nothing further needed.

## 2016-05-05 NOTE — Telephone Encounter (Signed)
Pt is still scheduled for 9/26.  I called outpt rehab this morning & spoke to [facility].  They have had a cancellation for this Friday the 22nd at 11:30.  She is going to call pt now to see if she wants to switch to Friday.  Nothing further needed.

## 2016-05-07 ENCOUNTER — Ambulatory Visit (INDEPENDENT_AMBULATORY_CARE_PROVIDER_SITE_OTHER): Payer: Managed Care, Other (non HMO) | Admitting: Adult Health

## 2016-05-07 ENCOUNTER — Encounter: Payer: Self-pay | Admitting: Adult Health

## 2016-05-07 VITALS — BP 112/70 | Temp 98.2°F | Ht 61.0 in | Wt 156.8 lb

## 2016-05-07 DIAGNOSIS — N3001 Acute cystitis with hematuria: Secondary | ICD-10-CM | POA: Diagnosis not present

## 2016-05-07 LAB — POCT URINALYSIS DIPSTICK
Bilirubin, UA: NEGATIVE
GLUCOSE UA: NEGATIVE
Ketones, UA: NEGATIVE
NITRITE UA: NEGATIVE
Protein, UA: NEGATIVE
Spec Grav, UA: 1.025
UROBILINOGEN UA: NEGATIVE
pH, UA: 6

## 2016-05-07 MED ORDER — CEPHALEXIN 500 MG PO CAPS: 500.0000 mg | ORAL_CAPSULE | Freq: Two times a day (BID) | ORAL | 0 refills | Status: DC

## 2016-05-07 NOTE — Patient Instructions (Signed)

## 2016-05-07 NOTE — Progress Notes (Signed)
[id number] Milessa Hogan, NP No chief complaint on file.   Current Issues:  Presents with 3  days of dysuria Associated symptoms include:  cloudy urine, dysuria and nausea  There is a previous history of of similar symptoms. Sexually active:  No   No concern for STI.  Prior to Admission medications   Medication Sig Start Date End Date Taking? Authorizing Provider  buPROPion (WELLBUTRIN XL) 150 MG 24 hr tablet  03/24/16   Historical Provider, MD  ciprofloxacin (CIPRO) 500 MG tablet  03/29/16   Historical Provider, MD  clopidogrel (PLAVIX) 75 MG tablet TAKE 1 TABLET BY MOUTH ONCE DAILY 03/17/16   Fay Records, MD  [employer] NASAL ALLERGY SPRAY 55 MCG/ACT AERO nasal inhaler USE 1 SPRAY IN EACH NOSTRIL EVERY DAY AT NIGHT 01/04/16   Historical Provider, MD  Dexlansoprazole 30 MG capsule Take 1 capsule (30 mg total) by mouth daily. 03/14/16   [name], NP  FLUoxetine (PROZAC) 40 MG capsule  02/20/16   Historical Provider, MD  HYDROmorphone (DILAUDID) 2 MG tablet Take 2 mg by mouth every 4 (four) hours as needed for severe pain.    Historical Provider, MD  hydrOXYzine (ATARAX/VISTARIL) 25 MG tablet Reported on 02/22/2016 02/20/16   Historical Provider, MD  metoprolol (LOPRESSOR) 50 MG tablet TAKE 1/2 TABLETS (25 MG TOTAL) BY MOUTH 2 (TWO) TIMES DAILY. 03/19/16   [name], NP  NONFORMULARY OR COMPOUNDED ITEM Gabapentin 6% in versa base. Place 1 ml vaginally at hs. Dispense 30 ml 04/01/16   Megan Salon, MD  predniSONE (DELTASONE) 10 MG tablet 4 tabs for 2 days, then 3 tabs for 2 days, 2 tabs for 2 days, then 1 tab for 2 days, then stop 04/01/16   Melvenia Needles, NP  Probiotic Product (PROBIOTIC PO) Take 1 capsule by mouth daily.     Historical Provider, MD  Red Yeast Rice Extract (RED YEAST RICE PO) Take 1 capsule by mouth daily.     Historical Provider, MD  simvastatin (ZOCOR) 80 MG tablet TAKE 1 TABLET BY MOUTH AT BEDTIME Patient taking differently: TAKE 80 MG BY MOUTH AT BEDTIME 01/22/16   [name], NP   UNABLE TO FIND Take 1 capsule by mouth daily. OTC Eye vitamin    Historical Provider, MD  UNABLE TO FIND Inject 1 mL into the skin every 14 (fourteen) days. b12 shots twice a month at MDs office    Historical Provider, MD  zolpidem (AMBIEN) 5 MG tablet TAKE 1 TABLET AT BEDTIME 04/15/16   [name], NP    Review of Systems: Negative unless mentioned above   PE:  LMP  (LMP Unknown)  Constitutional: Alert and oriented and does not appear in any acute distress Heart: normal rate and normal rhythm. No m/r/g Lungs" Clear to auscultation  Back: No CVA tenderness Abdomen" No abdominal tenderness, masses or hernias. No rebound noted   Results for orders placed or performed in visit on [id number]  Basic metabolic panel  Result Value Ref Range   Sodium 135 135 - 145 mEq/L   Potassium 4.0 3.5 - 5.1 mEq/L   Chloride 99 96 - 112 mEq/L   CO2 28 19 - 32 mEq/L   Glucose, Bld 98 70 - 99 mg/dL   BUN 10 6 - 23 mg/dL   Creatinine, Ser 0.86 0.40 - 1.20 mg/dL   Calcium 9.4 8.4 - 10.5 mg/dL   GFR 69.27 >60.00 mL/min    Assessment and Plan:    1. Acute cystitis with hematuria -  POCT urinalysis dipstick + Leuks and blood  - Urine culture - cephALEXin (KEFLEX) 500 MG capsule; Take 1 capsule (500 mg total) by mouth 2 (two) times daily.  Dispense: 14 capsule; Refill: 0 - Follow up as needed  [name], NP

## 2016-05-08 ENCOUNTER — Other Ambulatory Visit: Payer: Self-pay | Admitting: Internal Medicine

## 2016-05-08 LAB — URINE CULTURE

## 2016-05-09 ENCOUNTER — Ambulatory Visit (HOSPITAL_COMMUNITY)
Admission: RE | Admit: 2016-05-09 | Discharge: 2016-05-09 | Disposition: A | Discharge status: Home / Self Care | Payer: Managed Care, Other (non HMO) | Source: Ambulatory Visit | Attending: Pulmonary Disease | Admitting: Pulmonary Disease

## 2016-05-09 ENCOUNTER — Telehealth: Payer: Self-pay | Admitting: Adult Health

## 2016-05-09 DIAGNOSIS — R1310 Dysphagia, unspecified: Secondary | ICD-10-CM | POA: Insufficient documentation

## 2016-05-09 NOTE — Telephone Encounter (Signed)
Pt would like ua results  °

## 2016-05-09 NOTE — Telephone Encounter (Signed)
Patient notified of these results 

## 2016-05-11 ENCOUNTER — Other Ambulatory Visit: Payer: Self-pay | Admitting: Adult Health

## 2016-05-11 DIAGNOSIS — G4700 Insomnia, unspecified: Secondary | ICD-10-CM

## 2016-05-12 NOTE — Telephone Encounter (Signed)
Ok to refill for one month  

## 2016-05-13 ENCOUNTER — Other Ambulatory Visit (HOSPITAL_COMMUNITY): Payer: Medicare Other

## 2016-05-13 ENCOUNTER — Ambulatory Visit (HOSPITAL_COMMUNITY): Payer: Medicare Other

## 2016-05-13 ENCOUNTER — Encounter: Payer: Self-pay | Admitting: Internal Medicine

## 2016-05-13 ENCOUNTER — Ambulatory Visit (INDEPENDENT_AMBULATORY_CARE_PROVIDER_SITE_OTHER): Payer: Managed Care, Other (non HMO) | Admitting: Internal Medicine

## 2016-05-13 VITALS — BP 124/74 | HR 60 | Ht 61.0 in | Wt 157.0 lb

## 2016-05-13 DIAGNOSIS — R05 Cough: Secondary | ICD-10-CM | POA: Insufficient documentation

## 2016-05-13 DIAGNOSIS — F17210 Nicotine dependence, cigarettes, uncomplicated: Secondary | ICD-10-CM | POA: Diagnosis not present

## 2016-05-13 DIAGNOSIS — R058 Other specified cough: Secondary | ICD-10-CM | POA: Insufficient documentation

## 2016-05-13 DIAGNOSIS — R0600 Dyspnea, unspecified: Secondary | ICD-10-CM

## 2016-05-13 NOTE — Patient Instructions (Addendum)
Continue dexilant 60 mg Take 30-60 min before first meal of the day   Add pepcid 20 mg along with chlorpheniramine 4 mg x 1-2 about 1 hours before bed  For drainage / throat tickle try take CHLORPHENIRAMINE  4 mg - take one every 4 hours as needed - available over the counter- may cause drowsiness so start with just a bedtime dose or two and see how you tolerate it before trying in daytime    GERD (REFLUX)  is an extremely common cause of respiratory symptoms just like yours , many times with no obvious heartburn at all.    It can be treated with medication, but also with lifestyle changes including elevation of the head of your bed (ideally with 6 inch  bed blocks),  Smoking cessation, avoidance of late meals, excessive alcohol, and avoid fatty foods, chocolate, peppermint, colas, red wine, and acidic juices such as orange juice.  NO MINT OR MENTHOL PRODUCTS SO NO COUGH DROPS   USE SUGARLESS CANDY INSTEAD (Jolley ranchers or Stover's or Life Savers) or even ice chips will also do - the key is to swallow to prevent all throat clearing. NO OIL BASED VITAMINS - use powdered substitutes.  Please see patient coordinator before you leave today  to schedule ENT eval / Dr Victorio Palm group - no sooner than 2 weeks to let the medications work   No pulmonary follow up needed

## 2016-05-13 NOTE — Progress Notes (Signed)
Subjective:    Patient ID: Bianca [name], female    DOB: Sep 26, 1945,   MRN: [id number]  Brief patient profile:  30 yowf active smoker with h/o sinus symptoms mostly in spring ever since moving to [facility] around 1970s from [place] with ENT neg previous but never allergy eval  with new onset chest tightness and sob x mid June 2017 > 2 trips to ER with neg Cardiac eval referred to pulmonary clinic 02/20/2016 by [name].   History of Present Illness  02/20/2016 1st [facility] office visit/ Tangie Stay  Onset of symptoms was late March 2017 starting with "sinus infection" Chief Complaint  Patient presents with  . Pulmonary Consult    Referred by [name]. Pt c/o chest tightness and SOB for the past 6 wks- she believes that this is due to mold exposure. She states chest tightness comes and goes, but bothers her more when she lies down. She notices SOB "maybe when I rush up some steps".    on ppi x sev years takes 66m before supper  When arrived ER 12/25/15 cc "sob /chest heavy x awhile" then x  4 days much worse assoc with cough productive with green mucus and assoc with nausea and stated  "her doctors kept saying it's bronchitis but is not getting any better" > rec doxy > cough resolved but not the chest tightness or sob and intermittently having dysphagia since onset of symptoms and overt anxiety/depression  Symptoms are the same since ER x cough better, no variability x worse at hs consistently the same and doe x fast walk or steps.  Does not bother her once asleep rec Please remember to go to the lab   department downstairs for your tests - we will call you with the results when they are available. Change omeprazole to Take 30- 60 min before your first and last meals of the day  GERD diet  The key is to stop smoking completely before smoking completely stops you     04/01/2016 NP  Acute OV :  Patient presents for an acute office visit Patient complains of over last 6 months of post nasal  drainage, tickle in her throat and dry cough .  Says main issue with drainage in throat. Has very little cough. Drainage is so thick in throat it makes her sick. Patient was seen for pulmonary consult 02/20/2016 for chronic cough. She was started on GERD treatment. Started on PPI Twice daily  And GERD diet.  Chest x-ray in June 2017 showed clear lungs.. She misunderstood instructions. She did not change her dexilant from daily dosing.  Allergy profile was negative with IgE at 7. Sedimentation rate , TSH and BNP were normal. CBC in June 2017 showed normal eosinophils.  She denies any hemoptysis, chest pain, orthopnea, PND, or increased leg swelling. Spriometry was normal  rec Prednisone taper over next week.  Begin Zyrtec 10mg  At bedtime   Use sips of water to soothe throat to prevent throat clearing and coughing .  Continue on Dexilant daily before meal .  Add Pepcid 20mg  At bedtime    04/17/16 [facility] [employer] [facility] ov "no better"  rec   schedule   a swallow examination as well as a CAT scan of her neck. We will do blood work today.  04/30/16 CT neck :  Prominent soft tissue at the soft palate and lingual/palatine tonsils compatible with acute pharyngitis  05/09/16 ST   Nl eval/ rec ENT eval  05/13/2016  f/u ov/Alexander Mcauley re: refractory globus sensation/ still smoking and drinking lots of coke on dexilant 30 mg daily  Chief Complaint  Patient presents with  . Follow-up    Chest tightness is unchanged. She still feels like there is a lump in her throat. She is drinking a cola in the exam room today.    Not limited by breathing from desired activities    No obvious day to day or daytime variability or assoc excess/ purulent sputum or mucus plugs or hemoptysis or cp or chest tightness, subjective wheeze or overt sinus or hb symptoms. No unusual exp hx or h/o childhood pna/ asthma or knowledge of premature birth.  Sleeping ok without nocturnal  or early am exacerbation  of respiratory  c/o's or need for  noct saba. Also denies any obvious fluctuation of symptoms with weather or environmental changes or other aggravating or alleviating factors except as outlined above   Current Medications, Allergies, Complete Past Medical History, Past Surgical History, Family History, and Social History were reviewed in [employer] record.  ROS  The following are not active complaints unless bolded sore throat, dysphagia, dental problems, itching, sneezing,  nasal congestion or excess/ purulent secretions, ear ache,   fever, chills, sweats, unintended wt loss, classically pleuritic or exertional cp,  orthopnea pnd or leg swelling, presyncope, palpitations, abdominal pain, anorexia, nausea, vomiting, diarrhea  or change in bowel or bladder habits, change in stools or urine, dysuria,hematuria,  rash, arthralgias, visual complaints, headache, numbness, weakness or ataxia or problems with walking or coordination,  change in mood/affect or memory.            Objective:   Physical Exam  amb wf nad   Wt Readings from Last 3 Encounters:  05/13/16 157 lb (71.2 kg)  05/07/16 156 lb 12.8 oz (71.1 kg)  04/17/16 155 lb (70.3 kg)    Vital signs reviewed      HEENT: nl dentition, turbinates, and oropharynx. Nl external ear canals without cough reflex   NECK :  without JVD/Nodes/TM/ nl carotid upstrokes bilaterally   LUNGS: no acc muscle use,  Nl contour chest which is clear to A and P bilaterally without cough on insp or exp maneuvers   CV:  RRR  no s3 or murmur or increase in P2, no edema   ABD:  soft and nontender with nl inspiratory excursion in the supine position. No bruits or organomegaly, bowel sounds nl  MS:  Nl gait/ ext warm without deformities, calf tenderness, cyanosis or clubbing No obvious joint restrictions   SKIN: warm and dry without lesions    NEURO:  alert, approp, nl sensorium with  no motor deficits      CT throat  04/30/16 1. Prominent soft tissue at the  soft palate and lingual/palatine tonsils compatible with acute pharyngitis. No focal abscess or mass lesion is evident. 2. No significant adenopathy. 3. Multilevel spondylosis of the cervical spine as described. There is fusion at C3-4, likely acquired.    Labs ordered/ reviewed:      Chemistry      Component Value Date/Time   NA 135 02/09/2016 1042   K 3.7 02/09/2016 1042   CL 102 02/09/2016 1042   CO2 24 02/09/2016 1042   BUN <5* 02/09/2016 1042   CREATININE 0.69 02/09/2016 1042      Component Value Date/Time   CALCIUM 9.4 02/09/2016 1042   ALKPHOS 90 02/09/2016 1042   AST 22 02/09/2016 1042   ALT 11*  02/09/2016 1042   BILITOT 0.9 02/09/2016 1042        Lab Results  Component Value Date   WBC 7.2 02/09/2016   HGB 12.9 02/09/2016   HCT 35.2* 02/09/2016   MCV 85.4 02/09/2016   PLT 354 02/09/2016         Lab Results  Component Value Date   TSH 3.73 02/20/2016     Lab Results  Component Value Date   PROBNP 79.0 02/20/2016       Lab Results  Component Value Date   ESRSEDRATE 27 02/20/2016   ESRSEDRATE 16 12/28/2009

## 2016-05-13 NOTE — Telephone Encounter (Signed)
Rx called in as directed.   

## 2016-05-14 ENCOUNTER — Telehealth: Payer: Self-pay | Admitting: *Deleted

## 2016-05-14 ENCOUNTER — Encounter: Payer: Self-pay | Admitting: Internal Medicine

## 2016-05-14 NOTE — Assessment & Plan Note (Signed)
02/20/2016  Walked RA x 3 laps @ 185 ft each stopped due to end of study, nl pace, no significant desat or sob but did c/o back pain   - spirometry 02/20/2016 wnl including fef 25-75 with active symptoms - FENO 02/20/2016  =   15   - Allergy profile 02/20/2016 >  Eos 0.0 /  IgE  7 Neg RAST   Despite active smoking there is no evidence of any airway issue here.

## 2016-05-14 NOTE — Telephone Encounter (Signed)
-----   Message from Tanda Rockers, MD sent at 05/14/2016  5:31 AM EDT ----- Be sure she has dexilant 60 mg pills - med list says it's only 30 but instructions are for 60

## 2016-05-14 NOTE — Assessment & Plan Note (Signed)
>   3 min Discussed the risks and costs (both direct and indirect)  of smoking relative to the benefits of quitting but patient unwilling to commit at this point to a specific quit date.    Although I don't endorse regular use of e cigs/ many pts find them helpful; however, I emphasized they should be considered a "one-way bridge" off all tobacco products.  

## 2016-05-14 NOTE — Telephone Encounter (Signed)
Spoke with the pt and she states that when she gets home she will check her bottle and if she does not have the 60 mg she will call for rx

## 2016-05-14 NOTE — Assessment & Plan Note (Signed)
04/30/16 CT neck :  Prominent soft tissue at the soft palate and lingual/palatine tonsils compatible with acute pharyngitis  05/09/16 ST   Nl eval/ rec ENT eval  ENT referral 05/13/2016 >>>   Her exam is completely nl and I suspect the globus sensation is from throat clearing but note not yet on max gerd rx which I suggested she start now and see ENT in 2 weeks  I had an extended final summary discussion with the patient reviewing all relevant studies completed to date and  lasting 15 to 20 minutes of a 25 minute visit on the following issues:    Each maintenance medication was reviewed in detail including most importantly the difference between maintenance and as needed and under what circumstances the prns are to be used.  Please see instructions for details which were reviewed in writing and the patient given a copy.

## 2016-05-18 ENCOUNTER — Other Ambulatory Visit: Payer: Self-pay | Admitting: Internal Medicine

## 2016-05-20 ENCOUNTER — Encounter: Payer: Self-pay | Admitting: Physician Assistant

## 2016-05-21 ENCOUNTER — Encounter: Payer: Self-pay | Admitting: Physician Assistant

## 2016-05-21 ENCOUNTER — Ambulatory Visit (INDEPENDENT_AMBULATORY_CARE_PROVIDER_SITE_OTHER): Payer: Managed Care, Other (non HMO) | Admitting: Physician Assistant

## 2016-05-21 VITALS — BP 120/62 | HR 58 | Ht 61.0 in | Wt 158.1 lb

## 2016-05-21 DIAGNOSIS — F17210 Nicotine dependence, cigarettes, uncomplicated: Secondary | ICD-10-CM

## 2016-05-21 DIAGNOSIS — E7800 Pure hypercholesterolemia, unspecified: Secondary | ICD-10-CM | POA: Diagnosis not present

## 2016-05-21 DIAGNOSIS — I2510 Atherosclerotic heart disease of native coronary artery without angina pectoris: Secondary | ICD-10-CM

## 2016-05-21 DIAGNOSIS — I10 Essential (primary) hypertension: Secondary | ICD-10-CM

## 2016-05-21 MED ORDER — CLOPIDOGREL BISULFATE 75 MG PO TABS: 75.0000 mg | ORAL_TABLET | Freq: Every day | ORAL | 3 refills | Status: DC

## 2016-05-21 MED ORDER — METOPROLOL TARTRATE 50 MG PO TABS
ORAL_TABLET | ORAL | 0 refills | Status: DC
Start: 2016-05-21 — End: 2017-06-05

## 2016-05-21 NOTE — Progress Notes (Signed)
Cardiology Office Note:    Date:  05/21/2016   ID:  Bianca [name], DOB 03-Jan-1946, MRN [id number]  PCP:  [name], NP  Cardiologist:  Dr. Dorris Carnes   Electrophysiologist:  N/a Pulmonology: Dr. Melvyn Novas  Referring MD: [name], NP   Chief Complaint  Patient presents with  . Follow-up    CAD    History of Present Illness:    Bianca [name] is a 70 y.o. female with a hx of CAD, s/p MI in 1997 treated with PCI to the RCA, s/p Taxus DES x3 the RCA in 2007 in the setting of MI, HTN, HL. Echo 7/10: EF 55-60%, mild MR.  Admitted in 10/13 with a NSTEMI tx with DES to RCA and POBA to PDA.  Myoview in 5/15 was negative for ischemia. Last seen by Dr. Harrington Challenger 7/16. She was admitted in 6/17 with chest pain. Myocardial infarction was ruled out. She was seen by Dr. Domenic Polite for cardiology consultation. Inpatient echocardiogram demonstrated normal LV function. Outpatient stress testing was recommended. This was never done.  She returns for follow-up. She is here alone today. She denies any recurrent chest discomfort. She denies significant dyspnea. She denies orthopnea, PND or edema. She denies syncope. She continues to smoke.  Prior CV studies that were reviewed today include:    Echo 01/27/16 EF 55-60%, normal wall motion, trivial MR, trivial TR  Myoview 5/15  1. Normal myocardial perfusion study. No fixed or reversible defects. 2. Calculated ejection fraction of 67%.  LHC 05/18/12:   LAD mid 60-70%, D1 ostial 20%,  CFX mid 50%,  RCA prox to distal with multiple overlapping stents, mid 40-50% ISR, distal 60/95%, mPDA 80%,  EF 60%.  PCI:  Xience expedition DES to the RCA and balloon angioplasty to the PDA.   Past Medical History:  Diagnosis Date  . Arthritis   . Blood transfusion without reported diagnosis   . CAD (coronary artery disease)    a. 1997 MI/PCI RCA;  b. 2007 MI/PCI of 100% RCA with Taxus DES x 3 placed, EF 55%;  c. 2010 Cath: stable anatomy;  d. 05/2012 NSTEMI/Cath/PCI:  LM nl, LAD 60-14m, D1 20ost, LCX 3m, RCA 40-11m ISR, 60/95d (Treated w/ 2.75x33 Xience Xpedition DES), PDA 42m (Treated w/ PTCA), EF 60%.  . Carpal tunnel syndrome on both sides   . Chronic lower back pain   . Depression   . GERD (gastroesophageal reflux disease)   . Hyperlipidemia   . Myocardial infarction 1997, 2007, 2013  . Numbness of foot    Left foot - back surgery 2009    Past Surgical History:  Procedure Laterality Date  . ANKLE SURGERY  Years ago   Left; "had to take out a floater"  . CARPAL TUNNEL RELEASE Left 10/12016  . CESAREAN SECTION  1990  . CORONARY ANGIOPLASTY WITH STENT PLACEMENT  1997; 2007, 2013   "2 + 2; + 1= total of 5  . LEFT HEART CATHETERIZATION WITH CORONARY ANGIOGRAM N/A 05/18/2012   Procedure: LEFT HEART CATHETERIZATION WITH CORONARY ANGIOGRAM;  Surgeon: Wellington Hampshire, MD;  Location: [facility];  Service: Cardiovascular;  Laterality: N/A;  . [place]; 2009  . TOTAL HIP ARTHROPLASTY Right 05/24/2014   Procedure: RIGHT TOTAL HIP ARTHROPLASTY ANTERIOR APPROACH;  Surgeon: Gearlean Alf, MD;  Location: [facility];  Service: Orthopedics;  Laterality: Right;    Current Medications: Current Meds  Medication Sig  . buPROPion (WELLBUTRIN SR) 200 MG 12 hr tablet  Take 1 tablet by mouth every morning.   . clopidogrel (PLAVIX) 75 MG tablet Take 1 tablet (75 mg total) by mouth daily.  . [employer] NASAL ALLERGY SPRAY 55 MCG/ACT AERO nasal inhaler USE 1 SPRAY IN EACH NOSTRIL EVERY DAY AT NIGHT  . Dexlansoprazole 30 MG capsule Take 1 capsule (30 mg total) by mouth daily.  Marland Kitchen FLUoxetine (PROZAC) 40 MG capsule Take 40 mg by mouth daily.   Marland Kitchen HYDROmorphone (DILAUDID) 2 MG tablet Take 2 mg by mouth every 4 (four) hours as needed for severe pain.  . hydrOXYzine (ATARAX/VISTARIL) 25 MG tablet Reported on 02/22/2016  . metoprolol (LOPRESSOR) 50 MG tablet TAKE 1/2 TABLETS (25 MG TOTAL) BY MOUTH 2 (TWO) TIMES DAILY.  . NONFORMULARY OR COMPOUNDED ITEM  Gabapentin 6% in versa base. Place 1 ml vaginally at hs. Dispense 30 ml  . Probiotic Product (PROBIOTIC PO) Take 1 capsule by mouth daily.   . Red Yeast Rice Extract (RED YEAST RICE PO) Take 1 capsule by mouth daily.   . simvastatin (ZOCOR) 80 MG tablet Take 80 mg by mouth daily.  Marland Kitchen UNABLE TO FIND Take 1 capsule by mouth daily. OTC Eye vitamin  . UNABLE TO FIND Inject 1 mL into the skin every 14 (fourteen) days. b12 shots twice a month at MDs office  . zolpidem (AMBIEN) 5 MG tablet TAKE 1 TABLET BY MOUTH AT BEDTIME  . [DISCONTINUED] clopidogrel (PLAVIX) 75 MG tablet TAKE 1 TABLET BY MOUTH ONCE DAILY  . [DISCONTINUED] metoprolol (LOPRESSOR) 50 MG tablet TAKE 1/2 TABLETS (25 MG TOTAL) BY MOUTH 2 (TWO) TIMES DAILY.     Allergies:   Oxycodone hcl; Penicillins; Aspirin; Macrobid [nitrofurantoin monohyd macro]; Sulfa antibiotics; and Tramadol   Social History   Social History  . Marital status: Married    Spouse name: N/A  . Number of children: N/A  . Years of education: N/A   Social History Main Topics  . Smoking status: Light Tobacco Smoker    Packs/day: 0.50    Years: 50.00    Types: Cigarettes  . Smokeless tobacco: Never Used  . Alcohol use No  . Drug use: No  . Sexual activity: Not Currently    Birth control/ protection: Post-menopausal   Other Topics Concern  . None   Social History Narrative   Is a homemaker   Married for 54 years    Has a daughter and son    Pets: Two dogs and a Neurosurgeon   Likes to garden ( flower beds).            Family History:  The patient's family history includes Cancer in her paternal grandfather; Coronary artery disease in her mother; Dementia in her father; Diabetes in her mother; Hypertension in her mother; Sudden death in her brother.   ROS:   Please see the history of present illness.    ROS All other systems reviewed and are negative.   EKGs/Labs/Other Test Reviewed:    EKG:  EKG is  ordered today.  The ekg ordered today demonstrates  sinus brady, HR 58, normal axis, no acute ST changes, QTc 404 ms, no change from prior tracing  Recent Labs: 02/09/2016: ALT 11; Hemoglobin 12.9; Platelets 354 02/20/2016: Pro B Natriuretic peptide (BNP) 79.0; TSH 3.73 04/17/2016: BUN 10; Creatinine, Ser 0.86; Potassium 4.0; Sodium 135   Recent Lipid Panel    Component Value Date/Time   CHOL 181 02/14/2015 0850   TRIG 218.0 (H) 02/14/2015 0850   HDL 39.80 02/14/2015 0850  CHOLHDL 5 02/14/2015 0850   VLDL 43.6 (H) 02/14/2015 0850   LDLCALC 106 (H) 01/03/2014 0939   LDLDIRECT 105.0 02/14/2015 0850     Physical Exam:    VS:  BP 120/62   Pulse (!) 58   Ht 5\' 1"  (1.549 m)   Wt 158 lb 1.9 oz (71.7 kg)   LMP  (LMP Unknown)   BMI 29.88 kg/m     Wt Readings from Last 3 Encounters:  05/21/16 158 lb 1.9 oz (71.7 kg)  05/13/16 157 lb (71.2 kg)  05/07/16 156 lb 12.8 oz (71.1 kg)     Physical Exam  Constitutional: She is oriented to person, place, and time. She appears well-developed and well-nourished. No distress.  HENT:  Head: Normocephalic and atraumatic.  Eyes: No scleral icterus.  Neck: No JVD present.  Cardiovascular: Normal rate, regular rhythm and normal heart sounds.   No murmur heard. Pulmonary/Chest: Effort normal. She has no wheezes. She has no rales.  Abdominal: Soft. There is no tenderness.  Musculoskeletal: She exhibits no edema.  Neurological: She is alert and oriented to person, place, and time.  Skin: Skin is warm and dry.  Psychiatric: She has a normal mood and affect.    ASSESSMENT:    1. Coronary artery disease involving native coronary artery of native heart without angina pectoris   2. Essential hypertension   3. Pure hypercholesterolemia   4. Cigarette smoker    PLAN:    In order of problems listed above:  1. CAD - s/p MI in 1997, extensive stenting with DES x 3 to RCA in 2007 and NSTEMI in 10/13 tx with DES to RCA and POBA to PDA.  She did have an admission to the hospital in 6/17 with chest  pain and MI was r/o. Echo was normal.  OP myoview was recommended but she never did this test.  She has been on proton pump inhibitor for vocal cord dysfunction and has not had any further chest pain.  She has not had recurrent angina.  I do not think it is necessary to undergo stress testing at this point. She is intol of ASA.  Continue Plavix, beta-blocker, statin.  2. HL - Last LDL in 2016 was 105.  Goal LDL is < 70. Managed by PCP. She has been on Simvastatin 80 for a long time.  Would consider changing to Rosuvastatin 40 mg QD if LDL remains > 70.  3. HTN - Her blood pressure is controlled.   4. Tobacco abuse - I have recommended cessation.    Medication Adjustments/Labs and Tests Ordered: Current medicines are reviewed at length with the patient today.  Concerns regarding medicines are outlined above.  Medication changes, Labs and Tests ordered today are outlined in the Patient Instructions noted below. Patient Instructions  Medication Instructions:  REFILL PLAVIX AND LOPRESSOR  Labwork: NONE  Testing/Procedures: NONE  Follow-Up: Your physician wants you to follow-up in: [place] DR. ROSS You will receive a reminder letter in the mail two months in advance. If you don't receive a letter, please call our office to schedule the follow-up appointment.  Any Other Special Instructions Will Be Listed Below (If Applicable).  If you need a refill on your cardiac medications before your next appointment, please call your pharmacy.  Signed, Richardson Dopp, PA-C  05/21/2016 12:07 PM    [facility] [place], [place], [place]  [address] Phone: [phone]; Fax: [phone]

## 2016-05-21 NOTE — Patient Instructions (Addendum)
Medication Instructions:  REFILL PLAVIX AND LOPRESSOR  Labwork: NONE  Testing/Procedures: NONE  Follow-Up: Your physician wants you to follow-up in: [place] DR. ROSS You will receive a reminder letter in the mail two months in advance. If you don't receive a letter, please call our office to schedule the follow-up appointment.  Any Other Special Instructions Will Be Listed Below (If Applicable).  If you need a refill on your cardiac medications before your next appointment, please call your pharmacy.

## 2016-05-22 ENCOUNTER — Telehealth: Payer: Self-pay | Admitting: Physician Assistant

## 2016-05-22 NOTE — Telephone Encounter (Signed)
New message   Pt wants to use vaginal hormonal cream

## 2016-05-22 NOTE — Telephone Encounter (Signed)
Per Richardson Dopp, PA-c the pt is advised that she may use vaginal hormonal cream but not to take pill form as she has CAD. The pt verbalized understanding.

## 2016-05-23 ENCOUNTER — Telehealth: Payer: Self-pay | Admitting: Obstetrics and Gynecology

## 2016-05-23 NOTE — Telephone Encounter (Signed)
Patient says she asked her heart doctor about the hormone medication and she is ok to take long as it's not oral.

## 2016-05-23 NOTE — Telephone Encounter (Signed)
Routing to [place] for review and advise. Please see EPIC telephone note dated 05/22/2016 from Richardson Dopp, PA's office stating that the patient may use vaginal hormonal cream but should not take oral estrogen due to CAD.

## 2016-05-26 NOTE — Telephone Encounter (Signed)
Spoke with patient. Advised of message as seen below from [place]. Patient is agreeable. Appointment scheduled for 06/04/2016 at 2:30 pm with Dr.Silva. Patient is agreeable to date and time.  Routing to provider for final review. Patient agreeable to disposition. Will close encounter.

## 2016-05-26 NOTE — Addendum Note (Signed)
Addended by: Briant Cedar on: 05/26/2016 12:51 PM   Modules accepted: Orders

## 2016-05-26 NOTE — Telephone Encounter (Signed)
I reviewed the note from her PA at the cardiology office.  Please have her return to discuss the options for vaginal estrogen.  There are 3 choices, only one is generic.   I have not seen her back for a recheck since she started the gabapentin.   Thanks.

## 2016-06-04 ENCOUNTER — Encounter: Payer: Self-pay | Admitting: Obstetrics and Gynecology

## 2016-06-04 ENCOUNTER — Ambulatory Visit (INDEPENDENT_AMBULATORY_CARE_PROVIDER_SITE_OTHER): Payer: Managed Care, Other (non HMO) | Admitting: Obstetrics and Gynecology

## 2016-06-04 VITALS — BP 100/62 | HR 64 | Resp 16 | Ht 61.0 in | Wt 160.0 lb

## 2016-06-04 DIAGNOSIS — N952 Postmenopausal atrophic vaginitis: Secondary | ICD-10-CM

## 2016-06-04 DIAGNOSIS — R102 Pelvic and perineal pain: Secondary | ICD-10-CM | POA: Diagnosis not present

## 2016-06-04 NOTE — Progress Notes (Signed)
GYNECOLOGY  VISIT   HPI: 70 y.o.   Married  Caucasian  female   G2P2 with No LMP recorded (lmp unknown). Patient is postmenopausal.   here for   Discuss Estrogen meds. Has chronic burning in the vagina.   Has had negative Affirm testing.  She has had presumptive treatment for Candida.  Burning was first noted when she stopped using narcotics to control chronic back pain.   Normal pelvic ultrasound in July 2017.  Wants to use local vaginal estrogen.  Had an [place] from cardiology. Note in EPIC.   Not using gabapentin vaginally.  No difference in her vaginal pain.   Due for mammogram. Was done in 2015.   GYNECOLOGIC HISTORY: No LMP recorded (lmp unknown). Patient is postmenopausal. Contraception:  Post Menopausal Menopausal hormone therapy:  none Last mammogram:  01/05/2014 category 3 prob benign, 01/24/15 negative per patient Last pap smear:   01/10/14 Neg. No Abnormal paps        OB History    Gravida Para Term Preterm AB Living   2 2       2    SAB TAB Ectopic Multiple Live Births                     Patient Active Problem List   Diagnosis Date Noted  . Upper airway cough syndrome 05/13/2016  . Lump in throat 04/17/2016  . RAD (reactive airway disease) 04/01/2016  . Dyspnea 02/20/2016  . Hyponatremia 01/27/2016  . Bradycardia 01/27/2016  . B12 deficiency 07/11/2015  . Anemia associated with acute blood loss 05/28/2014  . Cigarette smoker 05/28/2014  . OA (osteoarthritis) of hip 05/24/2014  . Chest pain 12/19/2013  . History of non-ST elevation myocardial infarction (NSTEMI) 05/18/2012  . Allergic rhinitis due to pollen 04/01/2012  . Back pain 11/22/2010  . NEOPLASM OF UNCERTAIN BEHAVIOR OF SKIN 06/05/2010  . Swallowing difficulty 06/04/2010  . GERD 07/30/2009  . Hyperlipidemia 03/02/2007  . Depression 03/02/2007  . Coronary artery disease involving native heart without angina pectoris 03/02/2007    Past Medical History:  Diagnosis Date  . Arthritis   . Blood  transfusion without reported diagnosis   . CAD (coronary artery disease)    a. 1997 MI/PCI RCA;  b. 2007 MI/PCI of 100% RCA with Taxus DES x 3 placed, EF 55%;  c. 2010 Cath: stable anatomy;  d. 05/2012 NSTEMI/Cath/PCI: LM nl, LAD 60-49m, D1 20ost, LCX 67m, RCA 40-63m ISR, 60/95d (Treated w/ 2.75x33 Xience Xpedition DES), PDA 60m (Treated w/ PTCA), EF 60%.  . Carpal tunnel syndrome on both sides   . Chronic lower back pain   . Depression   . GERD (gastroesophageal reflux disease)   . Hyperlipidemia   . Myocardial infarction 1997, 2007, 2013  . Numbness of foot    Left foot - back surgery 2009    Past Surgical History:  Procedure Laterality Date  . ANKLE SURGERY  Years ago   Left; "had to take out a floater"  . CARPAL TUNNEL RELEASE Left 10/12016  . CESAREAN SECTION  1990  . CORONARY ANGIOPLASTY WITH STENT PLACEMENT  1997; 2007, 2013   "2 + 2; + 1= total of 5  . LEFT HEART CATHETERIZATION WITH CORONARY ANGIOGRAM N/A 05/18/2012   Procedure: LEFT HEART CATHETERIZATION WITH CORONARY ANGIOGRAM;  Surgeon: Wellington Hampshire, MD;  Location: [facility];  Service: Cardiovascular;  Laterality: N/A;  . [place]; 2009  . TOTAL HIP ARTHROPLASTY Right 05/24/2014  Procedure: RIGHT TOTAL HIP ARTHROPLASTY ANTERIOR APPROACH;  Surgeon: Gearlean Alf, MD;  Location: [facility];  Service: Orthopedics;  Laterality: Right;    Current Outpatient Prescriptions  Medication Sig Dispense Refill  . buPROPion (WELLBUTRIN SR) 200 MG 12 hr tablet Take 1 tablet by mouth every morning.   0  . clopidogrel (PLAVIX) 75 MG tablet Take 1 tablet (75 mg total) by mouth daily. 90 tablet 3  . CVS NASAL ALLERGY SPRAY 55 MCG/ACT AERO nasal inhaler USE 1 SPRAY IN EACH NOSTRIL EVERY DAY AT NIGHT  0  . Dexlansoprazole 30 MG capsule Take 1 capsule (30 mg total) by mouth daily. 30 capsule 3  . FLUoxetine (PROZAC) 40 MG capsule Take 40 mg by mouth daily.     Marland Kitchen HYDROmorphone (DILAUDID) 2 MG tablet Take 2 mg by  mouth every 4 (four) hours as needed for severe pain.    . hydrOXYzine (ATARAX/VISTARIL) 25 MG tablet Reported on 02/22/2016    . metoprolol (LOPRESSOR) 50 MG tablet TAKE 1/2 TABLETS (25 MG TOTAL) BY MOUTH 2 (TWO) TIMES DAILY. 90 tablet 0  . NONFORMULARY OR COMPOUNDED ITEM Gabapentin 6% in versa base. Place 1 ml vaginally at hs. Dispense 30 ml 1 each 0  . Probiotic Product (PROBIOTIC PO) Take 1 capsule by mouth daily.     . Red Yeast Rice Extract (RED YEAST RICE PO) Take 1 capsule by mouth daily.     . simvastatin (ZOCOR) 80 MG tablet Take 80 mg by mouth daily.    Marland Kitchen UNABLE TO FIND Take 1 capsule by mouth daily. OTC Eye vitamin    . UNABLE TO FIND Inject 1 mL into the skin every 14 (fourteen) days. b12 shots twice a month at MDs office    . zolpidem (AMBIEN) 5 MG tablet TAKE 1 TABLET BY MOUTH AT BEDTIME 30 tablet 0   No current facility-administered medications for this visit.      ALLERGIES: Oxycodone hcl; Penicillins; Aspirin; Macrobid [nitrofurantoin monohyd macro]; Sulfa antibiotics; and Tramadol  Family History  Problem Relation Age of Onset  . Cancer Paternal Grandfather     Esophageal  . Coronary artery disease Mother   . Diabetes Mother   . Hypertension Mother   . Dementia Father   . Sudden death Brother     Suicide    Social History   Social History  . Marital status: Married    Spouse name: N/A  . Number of children: N/A  . Years of education: N/A   Occupational History  . Not on file.   Social History Main Topics  . Smoking status: Light Tobacco Smoker    Packs/day: 0.50    Years: 50.00    Types: Cigarettes  . Smokeless tobacco: Never Used  . Alcohol use No  . Drug use: No  . Sexual activity: Not Currently    Birth control/ protection: Post-menopausal   Other Topics Concern  . Not on file   Social History Narrative   Is a homemaker   Married for 54 years    Has a daughter and son    Pets: Two dogs and a Neurosurgeon   Likes to garden ( flower beds).            ROS:  Pertinent items are noted in HPI.  PHYSICAL EXAMINATION:    BP 100/62 (BP Location: Right Arm, Patient Position: Sitting, Cuff Size: Normal)   Pulse 64   Resp 16   Ht 5\' 1"  (1.549 m)   Wt  160 lb (72.6 kg)   LMP  (LMP Unknown)   BMI 30.23 kg/m     General appearance: alert, cooperative and appears stated age   ASSESSMENT  Vaginal pain - etiology?  Possible atrophy.  No response to Gabapentin.  Hx MIs.  OK'd by cardiology for local estrogen. Hx depression.  PLAN  Patient will call to get her mammogram done.  Gabapentin discontinued. We discussed local estrogen therapy - cream, Vagifem, Estring.  Will prescribe local estrogen after her mammogram is back and is normal.  She is going to check on the cost of these options.  She was drawn to the idea of using the Estring initially.  We have discussed pelvic floor therapy already.  She has declined. If pain persists, I will need to send her to urology.    An After Visit Summary was printed and given to the patient.  __15____ minutes face to face time of which over 50% was spent in counseling.

## 2016-06-04 NOTE — Patient Instructions (Signed)
Estradiol vaginal ring (Estring) What is this medicine? ESTRADIOL (es tra DYE ole) vaginal ring is an insert that contains a female hormone. This medicine helps relieve symptoms of vaginal irritation and dryness that occurs in some women during menopause. This medicine may be used for other purposes; ask your health care provider or pharmacist if you have questions. What should I tell my health care provider before I take this medicine? They need to know if you have any of these conditions: -abnormal vaginal bleeding -blood vessel disease or blood clots -breast, cervical, endometrial, ovarian, liver, or uterine cancer -dementia -diabetes -gallbladder disease -heart disease or recent heart attack -high blood pressure -high cholesterol -high level of calcium in the blood -hysterectomy -kidney disease -liver disease -migraine headaches -protein C deficiency -protein S deficiency -stroke -systemic lupus erythematosus (SLE) -tobacco smoker -an unusual or allergic reaction to estrogens, other hormones, medicines, foods, dyes, or preservatives -pregnant or trying to get pregnant -breast-feeding How should I use this medicine? This medicine may be inserted by you or your physician. Follow the directions that are included with your prescription. If you are unsure how to insert the ring, contact your doctor or health care professional. The vaginal ring should remain in place for 90 days. After 90 days you should replace your old ring and insert a new one. Do not stop using except on the advice of your doctor or health care professional. Contact your pediatrician regarding the use of this medicine in children. Special care may be needed. A patient package insert for the product will be given with each prescription and refill. Read this sheet carefully each time. The sheet may change frequently. Overdosage: If you think you have taken too much of this medicine contact a poison control center or  emergency room at once. NOTE: This medicine is only for you. Do not share this medicine with others. What if I miss a dose? If you miss a dose, use it as soon as you can. If it is almost time for your next dose, use only that dose. Do not use double or extra doses. What may interact with this medicine? Do not take this medicine with any of the following medications: -aromatase inhibitors like aminoglutethimide, anastrozole, exemestane, letrozole, testolactone, vorozole This medicine may also interact with the following medications: -carbamazepine -certain antibiotics used to treat infections -certain barbiturates used for inducing sleep or treating seizures -grapefruit juice -medicines for fungus infections like itraconazole and ketoconazole -raloxifene or tamoxifen -rifabutin, rifampin, or rifapentine -ritonavir -St. John's Wort This list may not describe all possible interactions. Give your health care provider a list of all the medicines, herbs, non-prescription drugs, or dietary supplements you use. Also tell them if you smoke, drink alcohol, or use illegal drugs. Some items may interact with your medicine. What should I watch for while using this medicine? Visit your doctor or health care professional for regular checks on your progress. You will need a regular breast and pelvic exam and Pap smear while on this medicine. You should also discuss the need for regular mammograms with your health care professional, and follow his or her guidelines for these tests. This medicine can make your body retain fluid, making your fingers, hands, or ankles swell. Your blood pressure can go up. Contact your doctor or health care professional if you feel you are retaining fluid. If you have any reason to think you are pregnant, stop taking this medicine right away and contact your doctor or health care professional. Smoking increases  the risk of getting a blood clot or having a stroke while you are  taking this medicine, especially if you are more than 70 years old. You are strongly advised not to smoke. If you wear contact lenses and notice visual changes, or if the lenses begin to feel uncomfortable, consult your eye doctor or health care professional. This medicine can increase the risk of developing a condition (endometrial hyperplasia) that may lead to cancer of the lining of the uterus. Taking progestins, another hormone drug, with this medicine lowers the risk of developing this condition. Therefore, if your uterus has not been removed (by a hysterectomy), your doctor may prescribe a progestin for you to take together with your estrogen. You should know, however, that taking estrogens with progestins may have additional health risks. You should discuss the use of estrogens and progestins with your health care professional to determine the benefits and risks for you. If you are going to have surgery, you may need to stop taking this medicine. Consult your health care professional for advice before you schedule the surgery. You may bathe or participate in other activities while using this medicine. You do not need to remove the vaginal ring during sexual or other activities unless you are more comfortable doing so. Within the 90-day dosage period, you may remove the vaginal ring, rinse it with clean lukewarm (not hot or boiling) water, and re-insert the ring as needed. What side effects may I notice from receiving this medicine? Side effects that you should report to your doctor or health care professional as soon as possible: -allergic reactions like skin rash, itching or hives, swelling of the face, lips, or tongue -breast tissue changes or discharge -signs and symptoms of a blood clot such as breathing problems; changes in vision; chest pain; severe, sudden headache; pain, swelling, warmth in the leg; trouble speaking; sudden numbness or weakness of the face, arm or leg -signs and symptoms of  infection like fever or chills; vomiting; diarrhea; muscle pain; dizziness; or a red, sunburn-like rash on face and body -signs and symptoms of liver injury like dark yellow or brown urine; general ill feeling or flu-like symptoms; light-colored stools; loss of appetite; nausea; right upper belly pain; unusually weak or tired; yellowing of the eyes or skin -symptoms of bowel blockage like constipation, abdominal swelling, abdominal pain, inability to pass gas or have a bowel movement -symptoms of vaginal infection like itching, irritation or unusual discharge -unusual or increased vaginal bleeding -vaginal pain or soreness, redness, swelling Side effects that usually do not require medical attention (report to your doctor or health care professional if they continue or are bothersome): -breast tenderness -fluid retention -hair loss -headache -nausea -upset stomach -vaginal spotting This list may not describe all possible side effects. Call your doctor for medical advice about side effects. You may report side effects to FDA at 1-800-FDA-1088. Where should I keep my medicine? Keep out of the reach of children. Store at room temperature between 15 and 25 degrees C (59 and 77 degrees F). Throw away any unused medicine after the expiration date. NOTE: This sheet is a summary. It may not cover all possible information. If you have questions about this medicine, talk to your doctor, pharmacist, or health care provider.    2016, Elsevier/Gold Standard. (2014-05-29 13:20:25)

## 2016-06-05 ENCOUNTER — Other Ambulatory Visit: Payer: Self-pay | Admitting: Adult Health

## 2016-06-05 DIAGNOSIS — G4700 Insomnia, unspecified: Secondary | ICD-10-CM

## 2016-06-05 NOTE — Telephone Encounter (Signed)
Ok to refill for one month  

## 2016-06-05 NOTE — Telephone Encounter (Signed)
Ok to refill 

## 2016-06-05 NOTE — Telephone Encounter (Signed)
Rx called in as directed.   

## 2016-06-06 ENCOUNTER — Encounter: Payer: Self-pay | Admitting: Obstetrics and Gynecology

## 2016-06-14 ENCOUNTER — Other Ambulatory Visit: Payer: Self-pay | Admitting: Adult Health

## 2016-06-16 NOTE — Telephone Encounter (Signed)
Ok to refill for one year  

## 2016-06-19 ENCOUNTER — Ambulatory Visit (INDEPENDENT_AMBULATORY_CARE_PROVIDER_SITE_OTHER): Payer: Managed Care, Other (non HMO)

## 2016-06-19 DIAGNOSIS — E538 Deficiency of other specified B group vitamins: Secondary | ICD-10-CM | POA: Diagnosis not present

## 2016-06-19 DIAGNOSIS — Z23 Encounter for immunization: Secondary | ICD-10-CM

## 2016-06-19 MED ORDER — CYANOCOBALAMIN 1000 MCG/ML IJ SOLN
1000.0000 ug | Freq: Once | INTRAMUSCULAR | Status: AC
  Administered 2016-06-19: 1000 ug via INTRAMUSCULAR

## 2016-07-11 ENCOUNTER — Other Ambulatory Visit: Payer: Self-pay | Admitting: Adult Health

## 2016-07-11 DIAGNOSIS — G4700 Insomnia, unspecified: Secondary | ICD-10-CM

## 2016-07-14 ENCOUNTER — Telehealth: Payer: Self-pay | Admitting: Obstetrics and Gynecology

## 2016-07-14 NOTE — Telephone Encounter (Signed)
Patient had her MMG and it was good. She is requesting her hormone cream to [facility] into [facility] [address].

## 2016-07-14 NOTE — Telephone Encounter (Signed)
Ok to refill for one month  

## 2016-07-14 NOTE — Telephone Encounter (Signed)
Med refill request: "Hormone Cream" -Gabapentin  Last AEX: 09/11/15 Next AEX: None scheduled Last MMG (if hormonal med) -07/07/16 Refill authorized: Please Advise? [facility]

## 2016-07-14 NOTE — Telephone Encounter (Signed)
Spoke with Sharyn Lull at [facility]. MMG done 07/07/16 -results faxed to Dr. Quincy Simmonds 07/08/16.

## 2016-07-16 ENCOUNTER — Other Ambulatory Visit: Payer: Self-pay | Admitting: Obstetrics and Gynecology

## 2016-07-16 MED ORDER — ESTROGENS, CONJUGATED 0.625 MG/GM VA CREA: TOPICAL_CREAM | VAGINAL | 2 refills | Status: DC

## 2016-07-16 NOTE — Telephone Encounter (Signed)
Thank you.  Ok to close encounter. 

## 2016-07-16 NOTE — Telephone Encounter (Signed)
I have received her mammogram which was normal.   I sent a prescription through to [facility] for Premarin cream by mistake. (Can you call and cancel this?) I corrected the pharmacy and then [facility].  Patient is to use 1/2 gram pv at hs for 2 weeks and then 1/2 gram pv 2 -3 nights per week.   I would like to have her follow up in about 6 weeks with me.  Please schedule the appointment.   Thank you.

## 2016-07-16 NOTE — Telephone Encounter (Signed)
Patient returning call about refill. Spoke with patient. Advised as seen below per Dr. Quincy Simmonds. RN attempted to schedule 6wk f/u -patient states she will need to look at calendar and return call.     Spoke with Chrys Racer at [facility]- discontinued order for premarin cream.

## 2016-07-18 ENCOUNTER — Ambulatory Visit (INDEPENDENT_AMBULATORY_CARE_PROVIDER_SITE_OTHER): Payer: Managed Care, Other (non HMO)

## 2016-07-18 DIAGNOSIS — E538 Deficiency of other specified B group vitamins: Secondary | ICD-10-CM | POA: Diagnosis not present

## 2016-07-18 MED ORDER — CYANOCOBALAMIN 1000 MCG/ML IJ SOLN
1000.0000 ug | Freq: Once | INTRAMUSCULAR | Status: AC
  Administered 2016-07-18: 1000 ug via INTRAMUSCULAR

## 2016-07-25 ENCOUNTER — Encounter: Payer: Self-pay | Admitting: Family Medicine

## 2016-07-25 ENCOUNTER — Ambulatory Visit (INDEPENDENT_AMBULATORY_CARE_PROVIDER_SITE_OTHER): Payer: Managed Care, Other (non HMO) | Admitting: Family Medicine

## 2016-07-25 VITALS — BP 122/70 | HR 80 | Temp 97.9°F | Ht 61.0 in | Wt 144.3 lb

## 2016-07-25 DIAGNOSIS — R300 Dysuria: Secondary | ICD-10-CM

## 2016-07-25 LAB — POCT URINALYSIS DIPSTICK
Bilirubin, UA: NEGATIVE
Glucose, UA: NEGATIVE
KETONES UA: NEGATIVE
Leukocytes, UA: NEGATIVE
Nitrite, UA: NEGATIVE
PH UA: 5
PROTEIN UA: NEGATIVE
RBC UA: NEGATIVE
UROBILINOGEN UA: 0.2

## 2016-07-25 NOTE — Progress Notes (Signed)
HPI:  Acute visit for Dysuria: -reports chronic vulvovag irritation - reports had workup and no definitive dx but makes it difficult for her to tell when she has a UTI -the last few days dysuria, urgency, bladder spasm -no fevers, malaise, NVD, abd pain, back pain, hematuria, change in vaginal symptoms  ROS: See pertinent positives and negatives per HPI.  Past Medical History:  Diagnosis Date  . Arthritis   . Blood transfusion without reported diagnosis   . CAD (coronary artery disease)    a. 1997 MI/PCI RCA;  b. 2007 MI/PCI of 100% RCA with Taxus DES x 3 placed, EF 55%;  c. 2010 Cath: stable anatomy;  d. 05/2012 NSTEMI/Cath/PCI: LM nl, LAD 60-71m, D1 20ost, LCX 53m, RCA 40-34m ISR, 60/95d (Treated w/ 2.75x33 Xience Xpedition DES), PDA 33m (Treated w/ PTCA), EF 60%.  . Carpal tunnel syndrome on both sides   . Chronic lower back pain   . Depression   . GERD (gastroesophageal reflux disease)   . Hyperlipidemia   . Myocardial infarction 1997, 2007, 2013  . Numbness of foot    Left foot - back surgery 2009    Past Surgical History:  Procedure Laterality Date  . ANKLE SURGERY  Years ago   Left; "had to take out a floater"  . CARPAL TUNNEL RELEASE Left 10/12016  . CESAREAN SECTION  1990  . CORONARY ANGIOPLASTY WITH STENT PLACEMENT  1997; 2007, 2013   "2 + 2; + 1= total of 5  . LEFT HEART CATHETERIZATION WITH CORONARY ANGIOGRAM N/A 05/18/2012   Procedure: LEFT HEART CATHETERIZATION WITH CORONARY ANGIOGRAM;  Surgeon: Wellington Hampshire, MD;  Location: [facility];  Service: Cardiovascular;  Laterality: N/A;  . [place]; 2009  . TOTAL HIP ARTHROPLASTY Right 05/24/2014   Procedure: RIGHT TOTAL HIP ARTHROPLASTY ANTERIOR APPROACH;  Surgeon: Gearlean Alf, MD;  Location: [facility];  Service: Orthopedics;  Laterality: Right;    Family History  Problem Relation Age of Onset  . Cancer Paternal Grandfather     Esophageal  . Coronary artery disease Mother   .  Diabetes Mother   . Hypertension Mother   . Dementia Father   . Sudden death Brother     Suicide    Social History   Social History  . Marital status: Married    Spouse name: N/A  . Number of children: N/A  . Years of education: N/A   Social History Main Topics  . Smoking status: Light Tobacco Smoker    Packs/day: 0.50    Years: 50.00    Types: Cigarettes  . Smokeless tobacco: Never Used  . Alcohol use No  . Drug use: No  . Sexual activity: Not Currently    Birth control/ protection: Post-menopausal   Other Topics Concern  . None   Social History Narrative   Is a homemaker   Married for 65 years    Has a daughter and son    Pets: Two dogs and a Neurosurgeon   Likes to garden ( flower beds).            Current Outpatient Prescriptions:  .  buPROPion (WELLBUTRIN XL) 300 MG 24 hr tablet, , Disp: , Rfl:  .  clopidogrel (PLAVIX) 75 MG tablet, Take 1 tablet (75 mg total) by mouth daily., Disp: 90 tablet, Rfl: 3 .  conjugated estrogens (PREMARIN) vaginal cream, Use 1/2 g vaginally every night at bed time for the first 2 weeks, then use 1/2 g  vaginally two or three times per week as needed., Disp: 30 g, Rfl: 2 .  [facility] NASAL ALLERGY SPRAY 55 MCG/ACT AERO nasal inhaler, USE 1 SPRAY IN EACH NOSTRIL EVERY DAY AT NIGHT, Disp: , Rfl: 0 .  Dexlansoprazole 30 MG capsule, Take 1 capsule (30 mg total) by mouth daily., Disp: 30 capsule, Rfl: 3 .  FLUoxetine (PROZAC) 40 MG capsule, Take 40 mg by mouth daily. , Disp: , Rfl:  .  HYDROmorphone (DILAUDID) 2 MG tablet, Take 2 mg by mouth every 4 (four) hours as needed for severe pain., Disp: , Rfl:  .  hydrOXYzine (ATARAX/VISTARIL) 25 MG tablet, Reported on 02/22/2016, Disp: , Rfl:  .  metoprolol (LOPRESSOR) 50 MG tablet, TAKE 1/2 TABLETS (25 MG TOTAL) BY MOUTH 2 (TWO) TIMES DAILY., Disp: 90 tablet, Rfl: 0 .  metoprolol (LOPRESSOR) 50 MG tablet, TAKE 1/2 TABLETS (25 MG TOTAL) BY MOUTH 2 (TWO) TIMES DAILY., Disp: 90 tablet, Rfl: 3 .  NONFORMULARY OR  COMPOUNDED ITEM, Gabapentin 6% in versa base. Place 1 ml vaginally at hs. Dispense 30 ml, Disp: 1 each, Rfl: 0 .  Probiotic Product (PROBIOTIC PO), Take 1 capsule by mouth daily. , Disp: , Rfl:  .  Red Yeast Rice Extract (RED YEAST RICE PO), Take 1 capsule by mouth daily. , Disp: , Rfl:  .  simvastatin (ZOCOR) 80 MG tablet, Take 80 mg by mouth daily., Disp: , Rfl:  .  UNABLE TO FIND, Take 1 capsule by mouth daily. OTC Eye vitamin, Disp: , Rfl:  .  UNABLE TO FIND, Inject 1 mL into the skin every 14 (fourteen) days. b12 shots twice a month at MDs office, Disp: , Rfl:  .  zolpidem (AMBIEN) 5 MG tablet, TAKE 1 TABLET AT BEDTIME, Disp: 30 tablet, Rfl: 0  EXAM:  Vitals:   07/25/16 1309  BP: 122/70  Pulse: 80  Temp: 97.9 F (36.6 C)    Body mass index is 27.27 kg/m.  GENERAL: vitals reviewed and listed above, alert, oriented, appears well hydrated and in no acute distress  HEENT: atraumatic, conjunttiva clear, no obvious abnormalities on inspection of external nose and ears  NECK: no obvious masses on inspection  LUNGS: clear to auscultation bilaterally, no wheezes, rales or rhonchi, good air movement  CV: HRRR, no peripheral edema  ABD: no CVA TTP  MS: moves all extremities without noticeable abnormality  PSYCH: pleasant and cooperative, no obvious depression or anxiety  ASSESSMENT AND PLAN:  Discussed the following assessment and plan:  Dysuria  -udip ok, culture pending -Patient advised to return or notify a doctor immediately if symptoms worsen or persist or new concerns arise.  There are no Patient Instructions on file for this visit.  Colin Benton R., DO

## 2016-07-25 NOTE — Progress Notes (Signed)
Pre visit review using our clinic review tool, if applicable. No additional management support is needed unless otherwise documented below in the visit note. 

## 2016-07-28 LAB — URINE CULTURE

## 2016-07-30 ENCOUNTER — Encounter: Payer: Self-pay | Admitting: Obstetrics and Gynecology

## 2016-07-31 MED ORDER — CIPROFLOXACIN HCL 500 MG PO TABS: 500.0000 mg | ORAL_TABLET | Freq: Every day | ORAL | 0 refills | Status: DC

## 2016-07-31 NOTE — Addendum Note (Signed)
Addended by: Agnes Lawrence on: 07/31/2016 05:47 PM   Modules accepted: Orders

## 2016-08-12 ENCOUNTER — Other Ambulatory Visit: Payer: Self-pay | Admitting: Adult Health

## 2016-08-12 DIAGNOSIS — G4700 Insomnia, unspecified: Secondary | ICD-10-CM

## 2016-08-12 NOTE — Telephone Encounter (Signed)
Ok to refill for one month  

## 2016-08-19 ENCOUNTER — Other Ambulatory Visit: Payer: Self-pay | Admitting: Internal Medicine

## 2016-08-28 ENCOUNTER — Ambulatory Visit (INDEPENDENT_AMBULATORY_CARE_PROVIDER_SITE_OTHER): Payer: Managed Care, Other (non HMO)

## 2016-08-28 DIAGNOSIS — E538 Deficiency of other specified B group vitamins: Secondary | ICD-10-CM

## 2016-08-28 MED ORDER — CYANOCOBALAMIN 1000 MCG/ML IJ SOLN
1000.0000 ug | Freq: Once | INTRAMUSCULAR | Status: AC
  Administered 2016-08-28: 1000 ug via INTRAMUSCULAR

## 2016-08-28 NOTE — Progress Notes (Signed)
Patient had her Vit B12 Injection on 08/28/2016 for vit B12 deficiency. Given by Rosealee Albee CMA.

## 2016-09-09 ENCOUNTER — Other Ambulatory Visit: Payer: Self-pay | Admitting: Adult Health

## 2016-09-09 DIAGNOSIS — G4700 Insomnia, unspecified: Secondary | ICD-10-CM

## 2016-09-09 NOTE — Telephone Encounter (Signed)
Ok to refill for one month  

## 2016-09-09 NOTE — Telephone Encounter (Signed)
Rx called in as directed.   

## 2016-10-08 ENCOUNTER — Ambulatory Visit (INDEPENDENT_AMBULATORY_CARE_PROVIDER_SITE_OTHER): Payer: 59 | Admitting: Adult Health

## 2016-10-08 ENCOUNTER — Telehealth: Payer: Self-pay | Admitting: Adult Health

## 2016-10-08 ENCOUNTER — Other Ambulatory Visit: Payer: Self-pay

## 2016-10-08 VITALS — BP 142/70 | Temp 97.8°F | Ht 61.0 in | Wt 170.0 lb

## 2016-10-08 DIAGNOSIS — R300 Dysuria: Secondary | ICD-10-CM | POA: Diagnosis not present

## 2016-10-08 DIAGNOSIS — G4700 Insomnia, unspecified: Secondary | ICD-10-CM

## 2016-10-08 LAB — POC URINALSYSI DIPSTICK (AUTOMATED)
Bilirubin, UA: NEGATIVE
Glucose, UA: NEGATIVE
Ketones, UA: NEGATIVE
Leukocytes, UA: NEGATIVE
Nitrite, UA: NEGATIVE
PH UA: 6
SPEC GRAV UA: 1.025
Urobilinogen, UA: NEGATIVE

## 2016-10-08 MED ORDER — ZOLPIDEM TARTRATE 5 MG PO TABS: 5.0000 mg | ORAL_TABLET | Freq: Every day | ORAL | 0 refills | Status: DC

## 2016-10-08 NOTE — Telephone Encounter (Signed)
Pt needs a refill on Ambien.  Pharmacy: [facility]

## 2016-10-08 NOTE — Progress Notes (Signed)
   Subjective:    Patient ID: Bianca [name], female    DOB: 1945/10/06, 71 y.o.   MRN: [id number]  HPI  71 year old female who  has a past medical history of Arthritis; Blood transfusion without reported diagnosis; CAD (coronary artery disease); Carpal tunnel syndrome on both sides; Chronic lower back pain; Depression; GERD (gastroesophageal reflux disease); Hyperlipidemia; Myocardial infarction (1997, 2007, 2013); and Numbness of foot. She presents to the clinic today with UTI type symptoms. This is an ongoing issue with Bianca [name]. She reports that her symptoms today include that of increased dysuria and abdominal cramping, urgency and frequency. She denies any hematuria.   She would like to see Urology    Review of Systems See HPI       Objective:   Physical Exam  Constitutional: She is oriented to person, place, and time. She appears well-developed and well-nourished. No distress.  Cardiovascular: Normal rate, regular rhythm, normal heart sounds and intact distal pulses.  Exam reveals no gallop and no friction rub.   No murmur heard. Pulmonary/Chest: Effort normal and breath sounds normal. No respiratory distress. She has no wheezes. She has no rales. She exhibits no tenderness.  Abdominal: Soft. Normal appearance and bowel sounds are normal. She exhibits no distension and no mass. There is no tenderness. There is no rebound, no guarding and no CVA tenderness.  Neurological: She is alert and oriented to person, place, and time.  Skin: Skin is warm and dry. No rash noted. She is not diaphoretic. No erythema. No pallor.  Psychiatric: She has a normal mood and affect. Her behavior is normal. Judgment and thought content normal.  Nursing note and vitals reviewed.     Assessment & Plan:  1. Dysuria - POCT Urinalysis Dipstick (Automated)- + trace blood, otherwise negative. Will send culture and treat if needed - Ambulatory referral to Urology - Culture, Urine   [name], NP

## 2016-10-08 NOTE — Telephone Encounter (Signed)
Rx called in as directed. Thanks!

## 2016-10-08 NOTE — Telephone Encounter (Signed)
Received verbal from [facility] that this Rx is ok to refill.

## 2016-10-11 LAB — URINE CULTURE

## 2016-10-13 ENCOUNTER — Ambulatory Visit (INDEPENDENT_AMBULATORY_CARE_PROVIDER_SITE_OTHER): Payer: 59

## 2016-10-13 DIAGNOSIS — E538 Deficiency of other specified B group vitamins: Secondary | ICD-10-CM | POA: Diagnosis not present

## 2016-10-13 MED ORDER — CYANOCOBALAMIN 1000 MCG/ML IJ SOLN
1000.0000 ug | Freq: Once | INTRAMUSCULAR | Status: AC
  Administered 2016-10-13: 1000 ug via INTRAMUSCULAR

## 2016-10-30 ENCOUNTER — Other Ambulatory Visit: Payer: 59

## 2016-10-31 ENCOUNTER — Other Ambulatory Visit: Payer: Self-pay

## 2016-10-31 ENCOUNTER — Other Ambulatory Visit (INDEPENDENT_AMBULATORY_CARE_PROVIDER_SITE_OTHER): Payer: 59

## 2016-10-31 ENCOUNTER — Other Ambulatory Visit: Payer: Self-pay | Admitting: Adult Health

## 2016-10-31 DIAGNOSIS — Z0000 Encounter for general adult medical examination without abnormal findings: Secondary | ICD-10-CM

## 2016-10-31 DIAGNOSIS — E538 Deficiency of other specified B group vitamins: Secondary | ICD-10-CM

## 2016-10-31 LAB — POC URINALSYSI DIPSTICK (AUTOMATED)
Bilirubin, UA: NEGATIVE
Glucose, UA: NEGATIVE
Ketones, UA: NEGATIVE
NITRITE UA: NEGATIVE
PH UA: 5 (ref 5.0–8.0)
PROTEIN UA: NEGATIVE
RBC UA: NEGATIVE
Spec Grav, UA: 1.02 (ref 1.030–1.035)
UROBILINOGEN UA: 0.2 (ref ?–2.0)

## 2016-10-31 LAB — LIPID PANEL
CHOLESTEROL: 233 mg/dL — AB (ref 0–200)
HDL: 48.8 mg/dL (ref >39.00)
NONHDL: 184.23
TRIGLYCERIDES: 316 mg/dL — AB (ref 0.0–149.0)
Total CHOL/HDL Ratio: 5
VLDL: 63.2 mg/dL — ABNORMAL HIGH (ref 0.0–40.0)

## 2016-10-31 LAB — BASIC METABOLIC PANEL
BUN: 7 mg/dL (ref 6–23)
CALCIUM: 9.9 mg/dL (ref 8.4–10.5)
CHLORIDE: 102 meq/L (ref 96–112)
CO2: 28 mEq/L (ref 19–32)
CREATININE: 0.76 mg/dL (ref 0.40–1.20)
GFR: 79.77 mL/min (ref >60.00)
Glucose, Bld: 95 mg/dL (ref 70–99)
Potassium: 4.1 mEq/L (ref 3.5–5.1)
Sodium: 137 mEq/L (ref 135–145)

## 2016-10-31 LAB — CBC WITH DIFFERENTIAL/PLATELET
BASOS ABS: 0 10*3/uL (ref 0.0–0.1)
BASOS PCT: 0.6 % (ref 0.0–3.0)
EOS ABS: 0.2 10*3/uL (ref 0.0–0.7)
Eosinophils Relative: 2.7 % (ref 0.0–5.0)
HEMATOCRIT: 39.5 % (ref 36.0–46.0)
HEMOGLOBIN: 13.4 g/dL (ref 12.0–15.0)
LYMPHS PCT: 41.7 % (ref 12.0–46.0)
Lymphs Abs: 2.7 10*3/uL (ref 0.7–4.0)
MCHC: 33.9 g/dL (ref 30.0–36.0)
MCV: 90 fl (ref 78.0–100.0)
Monocytes Absolute: 0.6 10*3/uL (ref 0.1–1.0)
Monocytes Relative: 10 % (ref 3.0–12.0)
Neutro Abs: 2.9 10*3/uL (ref 1.4–7.7)
Neutrophils Relative %: 45 % (ref 43.0–77.0)
Platelets: 292 10*3/uL (ref 150.0–400.0)
RBC: 4.39 Mil/uL (ref 3.87–5.11)
RDW: 12.9 % (ref 11.5–15.5)
WBC: 6.4 10*3/uL (ref 4.0–10.5)

## 2016-10-31 LAB — HEPATIC FUNCTION PANEL
ALBUMIN: 4 g/dL (ref 3.5–5.2)
ALK PHOS: 78 U/L (ref 39–117)
ALT: 15 U/L (ref 0–35)
AST: 23 U/L (ref 0–37)
BILIRUBIN DIRECT: 0.1 mg/dL (ref 0.0–0.3)
Total Bilirubin: 0.7 mg/dL (ref 0.2–1.2)
Total Protein: 6.7 g/dL (ref 6.0–8.3)

## 2016-10-31 LAB — VITAMIN B12: Vitamin B-12: 542 pg/mL (ref 211–911)

## 2016-10-31 LAB — LDL CHOLESTEROL, DIRECT: Direct LDL: 129 mg/dL

## 2016-10-31 LAB — TSH: TSH: 6.21 u[IU]/mL — AB (ref 0.35–4.50)

## 2016-10-31 LAB — T4, FREE: FREE T4: 0.57 ng/dL — AB (ref 0.60–1.60)

## 2016-10-31 LAB — T3, FREE: T3 FREE: 3.3 pg/mL (ref 2.3–4.2)

## 2016-11-05 ENCOUNTER — Other Ambulatory Visit: Payer: Self-pay

## 2016-11-05 ENCOUNTER — Telehealth: Payer: Self-pay | Admitting: Adult Health

## 2016-11-05 ENCOUNTER — Encounter: Payer: 59 | Admitting: Adult Health

## 2016-11-05 DIAGNOSIS — G4700 Insomnia, unspecified: Secondary | ICD-10-CM

## 2016-11-05 MED ORDER — ZOLPIDEM TARTRATE 5 MG PO TABS
5.0000 mg | ORAL_TABLET | Freq: Every day | ORAL | 0 refills | Status: DC
Start: 2016-11-05 — End: 2016-12-07

## 2016-11-05 NOTE — Telephone Encounter (Signed)
Rx has been called in  

## 2016-11-05 NOTE — Telephone Encounter (Signed)
Pt request refill  ° °zolpidem (AMBIEN) 5 MG tablet ° °[id number] [place] [place] - [address] ° ° °

## 2016-11-05 NOTE — Telephone Encounter (Signed)
OK to refill

## 2016-11-05 NOTE — Telephone Encounter (Signed)
Ok to refill for one month  

## 2016-11-26 ENCOUNTER — Ambulatory Visit (INDEPENDENT_AMBULATORY_CARE_PROVIDER_SITE_OTHER): Payer: 59

## 2016-11-26 DIAGNOSIS — E538 Deficiency of other specified B group vitamins: Secondary | ICD-10-CM | POA: Diagnosis not present

## 2016-11-26 MED ORDER — CYANOCOBALAMIN 1000 MCG/ML IJ SOLN
1000.0000 ug | Freq: Once | INTRAMUSCULAR | Status: AC
  Administered 2016-11-26: 1000 ug via INTRAMUSCULAR

## 2016-11-26 NOTE — Progress Notes (Signed)
Patient had her B 12 injection for her Vit B12 deficiency on 11/26/2016 at 10am. Given by Rosealee Albee CMA.

## 2016-12-02 ENCOUNTER — Other Ambulatory Visit: Payer: Self-pay | Admitting: Adult Health

## 2016-12-02 NOTE — Telephone Encounter (Signed)
Ok to refill for 6 months 

## 2016-12-07 ENCOUNTER — Other Ambulatory Visit: Payer: Self-pay | Admitting: Adult Health

## 2016-12-07 DIAGNOSIS — G4700 Insomnia, unspecified: Secondary | ICD-10-CM

## 2016-12-08 NOTE — Telephone Encounter (Signed)
Ok to refill for 30 days  

## 2016-12-09 ENCOUNTER — Telehealth: Payer: Self-pay | Admitting: Internal Medicine

## 2016-12-09 NOTE — Telephone Encounter (Signed)
New Message  Pt call requesting to speak with RN. Pt states yesterday she experienced a pain in her chest. Pt states it almost felt like an electrical pain, but she states it was not an electrical shock. Pt states it was a pain she felt all over her chest.  appt for pt on 4/25 @10  with Vin. Please call back to discuss

## 2016-12-09 NOTE — Telephone Encounter (Signed)
Patient reports a weird electrical sensation over her whole chest yesterday afternoon. Sitting still.  Took SL ntg x2, after 20 min the feeling was gone.  Today feels hungover.  Drinking adequate fluids. Denies lightheadedness/dizziness or syncope.  BP was 111/68, she cant remember her HR.  Does not feel like her chest pain when she got her stents.   Reports this occurred a year ago and a couple days later she developed depression and memory loss which she voices is still occurring.   Reviewed meds, only takes Dexlansoprazole when having reflux symptoms.  Advised to take every day for effectiveness.  Not sure if this could have been the cause of her symptoms.  The only thing she ate yesterday morning was 1/2 pop tart.    Advised to keep appointment 4/25.  She is aware if symptoms recur and are worse or do not improve she should be seen in the ER.

## 2016-12-09 NOTE — Telephone Encounter (Signed)
Rx called in as directed.   

## 2016-12-10 ENCOUNTER — Other Ambulatory Visit: Payer: Self-pay | Admitting: Obstetrics and Gynecology

## 2016-12-10 ENCOUNTER — Ambulatory Visit: Payer: 59 | Admitting: Physician Assistant

## 2016-12-10 NOTE — Telephone Encounter (Signed)
Medication refill request: Conjugated Estrogens Last AEX:  09/11/15 DL Next AEX: Not scheduled  Last MMG (if hormonal medication request): 07/07/16 BIRADS1, Density B, [facility] Refill authorized: 07/16/16 #30g 2R. Please advise. Thank you.

## 2016-12-10 NOTE — Progress Notes (Deleted)
Cardiology Office Note    Date:  12/10/2016   ID:  Bianca [name], Bianca [name] July 19, 1946, MRN [id number]  PCP:  [name], NP  Cardiologist:  Dr. Harrington Challenger  Chief Complaint: Chest discomfort  History of Present Illness:   Bianca [name] is a 71 y.o. female with hx of CAD, HLD, HTN and GERD presents for follow chest discomfort.   Significant hx of CAD - MI in 1997 treated with PCI to the RCA, s/p Taxus DES x3 the RCA in 2007 in the setting of MI. Admitted 10/13 with a NSTEMI tx with DES to RCA and POBA to PDA.  Last myoview in 5/15 was negative for ischemia. Last echo 01/2016 showed normal LV function, trivial MR and Trivial TR.   She did have an admission to the hospital in 6/17 with chest pain and MI was r/o. Echo was normal.  OP myoview was recommended but she never did this test.   She was doing well on cardiac stand point when last seen by Richardson Dopp 05/2016. Did not felt need of stress testing given no angina.   Added to my schedule for chest discomfort.    Past Medical History:  Diagnosis Date  . Arthritis   . Blood transfusion without reported diagnosis   . CAD (coronary artery disease)    a. 1997 MI/PCI RCA;  b. 2007 MI/PCI of 100% RCA with Taxus DES x 3 placed, EF 55%;  c. 2010 Cath: stable anatomy;  d. 05/2012 NSTEMI/Cath/PCI: LM nl, LAD 60-27m, D1 20ost, LCX 74m, RCA 40-6m ISR, 60/95d (Treated w/ 2.75x33 Xience Xpedition DES), PDA 9m (Treated w/ PTCA), EF 60%.  . Carpal tunnel syndrome on both sides   . Chronic lower back pain   . Depression   . GERD (gastroesophageal reflux disease)   . Hyperlipidemia   . Myocardial infarction 1997, 2007, 2013  . Numbness of foot    Left foot - back surgery 2009    Past Surgical History:  Procedure Laterality Date  . ANKLE SURGERY  Years ago   Left; "had to take out a floater"  . CARPAL TUNNEL RELEASE Left 10/12016  . CESAREAN SECTION  1990  . CORONARY ANGIOPLASTY WITH STENT PLACEMENT  1997; 2007, 2013   "2 + 2; + 1= total of 5    . LEFT HEART CATHETERIZATION WITH CORONARY ANGIOGRAM N/A 05/18/2012   Procedure: LEFT HEART CATHETERIZATION WITH CORONARY ANGIOGRAM;  Surgeon: Wellington Hampshire, MD;  Location: [facility];  Service: Cardiovascular;  Laterality: N/A;  . [place]; 2009  . TOTAL HIP ARTHROPLASTY Right 05/24/2014   Procedure: RIGHT TOTAL HIP ARTHROPLASTY ANTERIOR APPROACH;  Surgeon: Gearlean Alf, MD;  Location: [facility];  Service: Orthopedics;  Laterality: Right;    Current Medications: Prior to Admission medications   Medication Sig Start Date End Date Taking? Authorizing Provider  buPROPion (WELLBUTRIN XL) 300 MG 24 hr tablet  07/15/16   Historical Provider, MD  ciprofloxacin (CIPRO) 500 MG tablet Take 1 tablet (500 mg total) by mouth daily. 07/31/16   Lucretia Kern, DO  clopidogrel (PLAVIX) 75 MG tablet Take 1 tablet (75 mg total) by mouth daily. 05/21/16   Liliane Shi, PA-C  conjugated estrogens (PREMARIN) vaginal cream Use 1/2 g vaginally every night at bed time for the first 2 weeks, then use 1/2 g vaginally two or three times per week as needed. 07/16/16   Brook Oletta Lamas, MD  [facility] NASAL ALLERGY SPRAY  55 MCG/ACT AERO nasal inhaler USE 1 SPRAY IN EACH NOSTRIL EVERY DAY AT NIGHT 01/04/16   Historical Provider, MD  Dexlansoprazole 30 MG capsule Take 1 capsule (30 mg total) by mouth daily. 03/14/16   [name], NP  FLUoxetine (PROZAC) 40 MG capsule Take 40 mg by mouth daily.  02/20/16   Historical Provider, MD  HYDROmorphone (DILAUDID) 2 MG tablet Take 2 mg by mouth every 4 (four) hours as needed for severe pain.    Historical Provider, MD  hydrOXYzine (ATARAX/VISTARIL) 25 MG tablet Reported on 02/22/2016 02/20/16   Historical Provider, MD  metoprolol (LOPRESSOR) 50 MG tablet TAKE 1/2 TABLETS (25 MG TOTAL) BY MOUTH 2 (TWO) TIMES DAILY. 05/21/16   Liliane Shi, PA-C  metoprolol (LOPRESSOR) 50 MG tablet TAKE 1/2 TABLETS (25 MG TOTAL) BY MOUTH 2 (TWO) TIMES DAILY. 06/17/16   Cory  Nafziger, NP  NITROSTAT 0.4 MG SL tablet PLACE 1 TABLET (0.4 MG TOTAL) UNDER THE TONGUE EVERY 5 (FIVE) MINUTES AS NEEDED FOR CHEST PAIN. 08/20/16   Fay Records, MD  NONFORMULARY OR COMPOUNDED ITEM Gabapentin 6% in versa base. Place 1 ml vaginally at hs. Dispense 30 ml 04/01/16   Megan Salon, MD  Probiotic Product (PROBIOTIC PO) Take 1 capsule by mouth daily.     Historical Provider, MD  Red Yeast Rice Extract (RED YEAST RICE PO) Take 1 capsule by mouth daily.     Historical Provider, MD  simvastatin (ZOCOR) 80 MG tablet TAKE 1 TABLET BY MOUTH AT BEDTIME 12/02/16   [name], NP  UNABLE TO FIND Take 1 capsule by mouth daily. OTC Eye vitamin    Historical Provider, MD  UNABLE TO FIND Inject 1 mL into the skin every 14 (fourteen) days. b12 shots twice a month at MDs office    Historical Provider, MD  zolpidem (AMBIEN) 5 MG tablet TAKE 1 TABLET BY MOUTH AT BEDTIME 12/09/16   [name], NP    Allergies:   Oxycodone hcl; Penicillins; Aspirin; Macrobid [nitrofurantoin monohyd macro]; Sulfa antibiotics; and Tramadol   Social History   Social History  . Marital status: Married    Spouse name: N/A  . Number of children: N/A  . Years of education: N/A   Social History Main Topics  . Smoking status: Light Tobacco Smoker    Packs/day: 0.50    Years: 50.00    Types: Cigarettes  . Smokeless tobacco: Never Used  . Alcohol use No  . Drug use: No  . Sexual activity: Not Currently    Birth control/ protection: Post-menopausal   Other Topics Concern  . Not on file   Social History Narrative   Is a homemaker   Married for 6 years    Has a daughter and son    Pets: Two dogs and a Neurosurgeon   Likes to garden ( flower beds).            Family History:  The patient's family history includes Cancer in her paternal grandfather; Coronary artery disease in her mother; Dementia in her father; Diabetes in her mother; Hypertension in her mother; Sudden death in her brother. ***  ROS:   Please see  the history of present illness.    ROS All other systems reviewed and are negative.   PHYSICAL EXAM:   VS:  LMP  (LMP Unknown)    GEN: Well nourished, well developed, in no acute distress  HEENT: normal  Neck: no JVD, carotid bruits, or masses Cardiac: ***RRR; no murmurs,  rubs, or gallops,no edema  Respiratory:  clear to auscultation bilaterally, normal work of breathing GI: soft, nontender, nondistended, + BS MS: no deformity or atrophy  Skin: warm and dry, no rash Neuro:  Alert and Oriented x 3, Strength and sensation are intact Psych: euthymic mood, full affect  Wt Readings from Last 3 Encounters:  10/08/16 170 lb (77.1 kg)  07/25/16 144 lb 4.8 oz (65.5 kg)  06/04/16 160 lb (72.6 kg)      Studies/Labs Reviewed:   EKG:  EKG is ordered today.  The ekg ordered today demonstrates ***  Recent Labs: 02/20/2016: Pro B Natriuretic peptide (BNP) 79.0 10/31/2016: ALT 15; BUN 7; Creatinine, Ser 0.76; Hemoglobin 13.4; Platelets 292.0; Potassium 4.1; Sodium 137; TSH 6.21   Lipid Panel    Component Value Date/Time   CHOL 233 (H) 10/31/2016 0915   TRIG 316.0 (H) 10/31/2016 0915   HDL 48.80 10/31/2016 0915   CHOLHDL 5 10/31/2016 0915   VLDL 63.2 (H) 10/31/2016 0915   LDLCALC 106 (H) 01/03/2014 0939   LDLDIRECT 129.0 10/31/2016 0915    Additional studies/ records that were reviewed today include:   Echo 01/27/16 EF 55-60%, normal wall motion, trivial MR, trivial TR  Myoview 5/15  1. Normal myocardial perfusion study. No fixed or reversible defects. 2. Calculated ejection fraction of 67%.  LHC 05/18/12:  LAD mid 60-70%, D1 ostial 20%,  CFX mid 50%,  RCA prox to distal with multiple overlapping stents, mid 40-50% ISR, distal 60/95%, mPDA 80%,  EF 60%.  PCI: Xience expedition DES to the RCA and balloon angioplasty to the PDA.     ASSESSMENT & PLAN:    1. CAD s/p multiple stents as above   2. HLD  3. HTN - Stable and well controlled on current medications  4.  Tobacco abuse    Medication Adjustments/Labs and Tests Ordered: Current medicines are reviewed at length with the patient today.  Concerns regarding medicines are outlined above.  Medication changes, Labs and Tests ordered today are listed in the Patient Instructions below. There are no Patient Instructions on file for this visit.   Jarrett Soho, [place]  12/10/2016 10:31 AM    [facility] [place], [place], [place]  [address] Phone: [phone]; Fax: [phone]

## 2016-12-11 ENCOUNTER — Encounter: Payer: Self-pay | Admitting: Obstetrics and Gynecology

## 2016-12-11 ENCOUNTER — Ambulatory Visit: Payer: 59 | Admitting: Physician Assistant

## 2016-12-11 ENCOUNTER — Ambulatory Visit (INDEPENDENT_AMBULATORY_CARE_PROVIDER_SITE_OTHER): Payer: 59 | Admitting: Obstetrics and Gynecology

## 2016-12-11 VITALS — BP 120/80 | HR 64 | Resp 12 | Ht 61.0 in | Wt 175.0 lb

## 2016-12-11 DIAGNOSIS — N949 Unspecified condition associated with female genital organs and menstrual cycle: Secondary | ICD-10-CM

## 2016-12-11 NOTE — Progress Notes (Signed)
GYNECOLOGY  VISIT   HPI: 71 y.o.   Married  Caucasian  female   G2P2 with No LMP recorded (lmp unknown). Patient is postmenopausal.   here for vaginal burning and some bilateral lower abdominal pain. Denies pain with urination.    Wants a recheck of the vaginal pain which she had had for 2 years.  No vaginal discharge or odor.  No vaginal bleeding.  First morning urination begins the pain.  It continues day and night.   Has some pain in lower abdomen on each side.  A little more constipation.  Takes Dilaudid twice a day from her orthopedic physician.  Told she is not able to have surgery.   Started vaginal estrogen cream and did not make any improvement in the vaginal pain.   Normal pelvic ultrasound in July 2017.   Tried intravaginal gabapentin and did not get relief of pain.   Did see her PCP on 10/09/16 for dysuria.  Had a negative UC.  Had gonorrhea and chlamydia both negative on 02/09/16.  GYNECOLOGIC HISTORY: No LMP recorded (lmp unknown). Patient is postmenopausal. Contraception:  Postmenopausal Menopausal hormone therapy:  none Last mammogram: 07-07-16 3D/Density B/Neg/BiRads1:[facility] Last pap smear: 09-11-15 Neg;01-10-14 Neg        OB History    Gravida Para Term Preterm AB Living   2 2       2    SAB TAB Ectopic Multiple Live Births                     Patient Active Problem List   Diagnosis Date Noted  . Upper airway cough syndrome 05/13/2016  . Lump in throat 04/17/2016  . RAD (reactive airway disease) 04/01/2016  . Dyspnea 02/20/2016  . Hyponatremia 01/27/2016  . Bradycardia 01/27/2016  . B12 deficiency 07/11/2015  . Anemia associated with acute blood loss 05/28/2014  . Cigarette smoker 05/28/2014  . OA (osteoarthritis) of hip 05/24/2014  . Chest pain 12/19/2013  . History of non-ST elevation myocardial infarction (NSTEMI) 05/18/2012  . Allergic rhinitis due to pollen 04/01/2012  . Back pain 11/22/2010  . NEOPLASM OF UNCERTAIN BEHAVIOR OF SKIN  06/05/2010  . Swallowing difficulty 06/04/2010  . GERD 07/30/2009  . Hyperlipidemia 03/02/2007  . Depression 03/02/2007  . Coronary artery disease involving native heart without angina pectoris 03/02/2007    Past Medical History:  Diagnosis Date  . Arthritis   . Blood transfusion without reported diagnosis   . CAD (coronary artery disease)    a. 1997 MI/PCI RCA;  b. 2007 MI/PCI of 100% RCA with Taxus DES x 3 placed, EF 55%;  c. 2010 Cath: stable anatomy;  d. 05/2012 NSTEMI/Cath/PCI: LM nl, LAD 60-79m, D1 20ost, LCX 24m, RCA 40-64m ISR, 60/95d (Treated w/ 2.75x33 Xience Xpedition DES), PDA 37m (Treated w/ PTCA), EF 60%.  . Carpal tunnel syndrome on both sides   . Chronic lower back pain   . Depression   . GERD (gastroesophageal reflux disease)   . Hyperlipidemia   . Myocardial infarction [facility]) 1997, 2007, 2013  . Numbness of foot    Left foot - back surgery 2009    Past Surgical History:  Procedure Laterality Date  . ANKLE SURGERY  Years ago   Left; "had to take out a floater"  . CARPAL TUNNEL RELEASE Left 10/12016  . CESAREAN SECTION  1990  . CORONARY ANGIOPLASTY WITH STENT PLACEMENT  1997; 2007, 2013   "2 + 2; + 1= total of 5  . LEFT HEART  CATHETERIZATION WITH CORONARY ANGIOGRAM N/A 05/18/2012   Procedure: LEFT HEART CATHETERIZATION WITH CORONARY ANGIOGRAM;  Surgeon: Wellington Hampshire, MD;  Location: [facility];  Service: Cardiovascular;  Laterality: N/A;  . [place]; 2009  . TOTAL HIP ARTHROPLASTY Right 05/24/2014   Procedure: RIGHT TOTAL HIP ARTHROPLASTY ANTERIOR APPROACH;  Surgeon: Gearlean Alf, MD;  Location: [facility];  Service: Orthopedics;  Laterality: Right;    Current Outpatient Prescriptions  Medication Sig Dispense Refill  . buPROPion (WELLBUTRIN XL) 150 MG 24 hr tablet TAKE 1 TABLET BY MOUTH EVERY DAY IN THE MORNING  1  . clopidogrel (PLAVIX) 75 MG tablet Take 1 tablet (75 mg total) by mouth daily. 90 tablet 3  . [employer] NASAL ALLERGY SPRAY  55 MCG/ACT AERO nasal inhaler USE 1 SPRAY IN EACH NOSTRIL EVERY DAY AT NIGHT  0  . Dexlansoprazole 30 MG capsule Take 1 capsule (30 mg total) by mouth daily. 30 capsule 3  . FLUoxetine (PROZAC) 40 MG capsule Take 60 mg by mouth daily.     Marland Kitchen HYDROmorphone (DILAUDID) 2 MG tablet Take 2 mg by mouth every 4 (four) hours as needed for severe pain.    . metoprolol (LOPRESSOR) 50 MG tablet TAKE 1/2 TABLETS (25 MG TOTAL) BY MOUTH 2 (TWO) TIMES DAILY. 90 tablet 0  . NITROSTAT 0.4 MG SL tablet PLACE 1 TABLET (0.4 MG TOTAL) UNDER THE TONGUE EVERY 5 (FIVE) MINUTES AS NEEDED FOR CHEST PAIN. 25 tablet 0  . PREMARIN vaginal cream USE 1/2 GRAM VAGINALLY EVERY NIGHT AT BEDTIME FOR THE 1ST 2 WEEKS,, THEN 1/2 GRAM 2-3 TIMES A WEEK 30 g 1  . Probiotic Product (PROBIOTIC PO) Take 1 capsule by mouth daily.     . simvastatin (ZOCOR) 80 MG tablet TAKE 1 TABLET BY MOUTH AT BEDTIME 30 tablet 5  . UNABLE TO FIND Take 1 capsule by mouth daily. OTC Eye vitamin    . UNABLE TO FIND Inject 1 mL into the skin every 14 (fourteen) days. b12 shots twice a month at MDs office    . zolpidem (AMBIEN) 5 MG tablet TAKE 1 TABLET BY MOUTH AT BEDTIME 30 tablet 0  . NUCYNTA 50 MG tablet TAKE 1 TABLET BY MOUTH 3 TIMES A DAY AS NEEDED FOR PAIN  0   No current facility-administered medications for this visit.      ALLERGIES: Oxycodone hcl; Penicillins; Aspirin; Macrobid [nitrofurantoin monohyd macro]; Sulfa antibiotics; and Tramadol  Family History  Problem Relation Age of Onset  . Cancer Paternal Grandfather     Esophageal  . Coronary artery disease Mother   . Diabetes Mother   . Hypertension Mother   . Dementia Father   . Sudden death Brother     Suicide    Social History   Social History  . Marital status: Married    Spouse name: N/A  . Number of children: N/A  . Years of education: N/A   Occupational History  . Not on file.   Social History Main Topics  . Smoking status: Light Tobacco Smoker    Packs/day: 0.50     Years: 50.00    Types: Cigarettes  . Smokeless tobacco: Never Used  . Alcohol use No  . Drug use: No  . Sexual activity: Not Currently    Birth control/ protection: Post-menopausal   Other Topics Concern  . Not on file   Social History Narrative   Is a homemaker   Married for 37 years  Has a daughter and son    Pets: Two dogs and a cat   Likes to garden ( flower beds).           ROS:  Pertinent items are noted in HPI.  PHYSICAL EXAMINATION:    BP 120/80 (BP Location: Right Arm, Patient Position: Sitting, Cuff Size: Normal)   Pulse 64   Resp 12   Ht 5\' 1"  (1.549 m)   Wt 175 lb (79.4 kg)   LMP  (LMP Unknown)   BMI 33.07 kg/m     General appearance: alert, cooperative and appears stated age    Pelvic: External genitalia:  no lesions              Urethra:  normal appearing urethra with no masses, tenderness or lesions              Bartholins and Skenes: normal                 Vagina: normal appearing vagina with normal color and discharge, no lesions              Cervix:  absent                Bimanual Exam:  Uterus:  absent              Adnexa: no mass, fullness, tenderness              Rectal exam: Yes.  .  Confirms.              Anus:  normal sphincter tone, no lesions  Chaperone was present for exam.  ASSESSMENT  Vaginal pain, chronic for 2 years.  Possible interstitial cystitis versus pelvic floor pain.  PLAN  Affirm done to rule out vaginitis today.  Ok to continue with vaginal estrogen. Will refer to Dr. Alona Bene, urology Discussed possible bladder instillations and pelvic floor therapy.  If no urologic etiology found, will refer to pelvic pain clinic at [facility].   An After Visit Summary was printed and given to the patient.  __25____ minutes face to face time of which over 50% was spent in counseling.

## 2016-12-12 ENCOUNTER — Other Ambulatory Visit: Payer: Self-pay | Admitting: *Deleted

## 2016-12-12 LAB — WET PREP BY MOLECULAR PROBE
CANDIDA SPECIES: NOT DETECTED
Gardnerella vaginalis: DETECTED — AB
TRICHOMONAS VAG: NOT DETECTED

## 2016-12-12 MED ORDER — METRONIDAZOLE 500 MG PO TABS: 500.0000 mg | ORAL_TABLET | Freq: Two times a day (BID) | ORAL | 0 refills | Status: DC

## 2016-12-24 ENCOUNTER — Ambulatory Visit (INDEPENDENT_AMBULATORY_CARE_PROVIDER_SITE_OTHER): Payer: 59 | Admitting: Family Medicine

## 2016-12-24 VITALS — BP 120/70 | HR 58 | Temp 98.2°F | Wt 172.7 lb

## 2016-12-24 DIAGNOSIS — W57XXXA Bitten or stung by nonvenomous insect and other nonvenomous arthropods, initial encounter: Secondary | ICD-10-CM | POA: Diagnosis not present

## 2016-12-24 DIAGNOSIS — S20461A Insect bite (nonvenomous) of right back wall of thorax, initial encounter: Secondary | ICD-10-CM

## 2016-12-24 NOTE — Progress Notes (Signed)
Subjective:     Patient ID: Bianca [name], female   DOB: Dec 17, 1945, 71 y.o.   MRN: [id number]  HPI Patient seen with concern for possible "insect bite" top of her back on the right side. She first noted this Monday. She's had some itching. No pain. No fevers or chills. She had a little lightheadedness but otherwise feels fine. She has not tried anything topically other than some Neosporin.  Past Medical History:  Diagnosis Date  . Arthritis   . Blood transfusion without reported diagnosis   . CAD (coronary artery disease)    a. 1997 MI/PCI RCA;  b. 2007 MI/PCI of 100% RCA with Taxus DES x 3 placed, EF 55%;  c. 2010 Cath: stable anatomy;  d. 05/2012 NSTEMI/Cath/PCI: LM nl, LAD 60-37m, D1 20ost, LCX 45m, RCA 40-59m ISR, 60/95d (Treated w/ 2.75x33 Xience Xpedition DES), PDA 71m (Treated w/ PTCA), EF 60%.  . Carpal tunnel syndrome on both sides   . Chronic lower back pain   . Depression   . GERD (gastroesophageal reflux disease)   . Hyperlipidemia   . Myocardial infarction [facility]) 1997, 2007, 2013  . Numbness of foot    Left foot - back surgery 2009   Past Surgical History:  Procedure Laterality Date  . ANKLE SURGERY  Years ago   Left; "had to take out a floater"  . CARPAL TUNNEL RELEASE Left 10/12016  . CESAREAN SECTION  1990  . CORONARY ANGIOPLASTY WITH STENT PLACEMENT  1997; 2007, 2013   "2 + 2; + 1= total of 5  . LEFT HEART CATHETERIZATION WITH CORONARY ANGIOGRAM N/A 05/18/2012   Procedure: LEFT HEART CATHETERIZATION WITH CORONARY ANGIOGRAM;  Surgeon: Wellington Hampshire, MD;  Location: [facility];  Service: Cardiovascular;  Laterality: N/A;  . [place]; 2009  . TOTAL HIP ARTHROPLASTY Right 05/24/2014   Procedure: RIGHT TOTAL HIP ARTHROPLASTY ANTERIOR APPROACH;  Surgeon: Gearlean Alf, MD;  Location[facility];  Service: Orthopedics;  Laterality: Right;    reports that she has been smoking Cigarettes.  She has a 25.00 pack-year smoking history. She has never  used smokeless tobacco. She reports that she does not drink alcohol or use drugs. family history includes Cancer in her paternal grandfather; Coronary artery disease in her mother; Dementia in her father; Diabetes in her mother; Hypertension in her mother; Sudden death in her brother. Allergies  Allergen Reactions  . Oxycodone Hcl Itching and Other (See Comments)    confusion  . Penicillins Itching and Rash    Has patient had a PCN reaction causing immediate rash, facial/tongue/throat swelling, SOB or lightheadedness with hypotension: yes rash that took a while to go away across the abdomen Has patient had a PCN reaction causing severe rash involving mucus membranes or skin necrosis: no Has patient had a PCN reaction that required hospitalization: no Has patient had a PCN reaction occurring within the last 10 years: No If all of the above answers are "NO", then may proceed with Cephalosporin use.   . Aspirin     Burning in stomach  . Macrobid [Nitrofurantoin Monohyd Macro] Nausea And Vomiting  . Sulfa Antibiotics Nausea Only  . Tramadol Itching    Seeing things     Review of Systems  Constitutional: Negative for chills and fever.  Respiratory: Negative for shortness of breath.   Cardiovascular: Negative for chest pain.  Neurological: Positive for light-headedness.       Objective:   Physical Exam  Constitutional: She  appears well-developed and well-nourished.  Cardiovascular: Normal rate and regular rhythm.   Pulmonary/Chest: Effort normal and breath sounds normal. No respiratory distress. She has no wheezes. She has no rales.  Skin:  She has small nonspecific punctate dot on her right upper back with some mild surrounding erythema. No pustules. Nontender. No fluctuance       Assessment:     Probable insect bite with local allergic reaction. No signs of cellulitis    Plan:     -Reassurance. -Try some topical over-the-counter hydrocortisone cream and consider  over-the-counter Zyrtec or Claritin  Eulas Post MD [facility]

## 2016-12-24 NOTE — Patient Instructions (Signed)
Try some OTC hydrocortisone cream and consider OTC Claritan or Zyrtec for any itching.

## 2016-12-25 NOTE — Progress Notes (Signed)
Cardiology Office Note    Date:  12/29/2016   ID:  Bianca [name], DOB 09-01-1945, MRN [id number]  PCP:  [name], NP  Cardiologist:  Dr. Harrington Challenger Pulmonology: Dr. Melvyn Novas  Chief Complaint: Chest Pain   History of Present Illness:   Bianca [name] is a 71 y.o. female with hx of CAD, HTN, HLD, tobacco abuse and GERD presents for chest pain.   Hx of CAD s/p MI in 1997 treated with PCI to the RCA, s/p Taxus DES x3 the RCA in 2007 in the setting of MI. Admitted in 10/13 with a NSTEMI tx with DES to RCA and POBA to PDA. Normal myoview 12/2013. Last echo 01/2016 showed normal EF.  She was doing well on cardiac stand point when last seen by APP 05/2016.  About 2 weeks ago, she had cp and added to my schedule. He was standing and started to having right-sided dull achy chest pain that radiated across her chest to her left side. Associated with diaphoresis. No shortness of breath. Relieved in 5 minutes with 1 SL nitro. Also had an episode of chest discomfort while walking @ daughter's graduation. Difficult historian due to recent memory issue. Also has abnormal thyroid level 10/2016. No follow-up. Patient denies any orthopnea, PND, syncope, dizziness, lower extremity edema, melena or blood in her stool or urine. Compliant with heart healthy diet. Says she stopped smoking tobacco 2 days ago.   Past Medical History:  Diagnosis Date  . Arthritis   . Blood transfusion without reported diagnosis   . CAD (coronary artery disease)    a. 1997 MI/PCI RCA;  b. 2007 MI/PCI of 100% RCA with Taxus DES x 3 placed, EF 55%;  c. 2010 Cath: stable anatomy;  d. 05/2012 NSTEMI/Cath/PCI: LM nl, LAD 60-54m, D1 20ost, LCX 90m, RCA 40-11m ISR, 60/95d (Treated w/ 2.75x33 Xience Xpedition DES), PDA 43m (Treated w/ PTCA), EF 60%.  . Carpal tunnel syndrome on both sides   . Chronic lower back pain   . Depression   . GERD (gastroesophageal reflux disease)   . Hyperlipidemia   . Myocardial infarction [facility]) 1997, 2007, 2013    . Numbness of foot    Left foot - back surgery 2009    Past Surgical History:  Procedure Laterality Date  . ANKLE SURGERY  Years ago   Left; "had to take out a floater"  . CARPAL TUNNEL RELEASE Left 10/12016  . CESAREAN SECTION  1990  . CORONARY ANGIOPLASTY WITH STENT PLACEMENT  1997; 2007, 2013   "2 + 2; + 1= total of 5  . LEFT HEART CATHETERIZATION WITH CORONARY ANGIOGRAM N/A 05/18/2012   Procedure: LEFT HEART CATHETERIZATION WITH CORONARY ANGIOGRAM;  Surgeon: Wellington Hampshire, MD;  Location: [facility] CATH [facility];  Service: Cardiovascular;  Laterality: N/A;  . [place] [place]; 2009  . TOTAL HIP ARTHROPLASTY Right 05/24/2014   Procedure: RIGHT TOTAL HIP ARTHROPLASTY ANTERIOR APPROACH;  Surgeon: Gearlean Alf, MD;  Location: [facility];  Service: Orthopedics;  Laterality: Right;    Current Medications: Prior to Admission medications   Medication Sig Start Date End Date Taking? Authorizing Provider  buPROPion (WELLBUTRIN XL) 150 MG 24 hr tablet TAKE 1 TABLET BY MOUTH EVERY DAY IN THE MORNING 11/11/16   [provider]  clopidogrel (PLAVIX) 75 MG tablet Take 1 tablet (75 mg total) by mouth daily. 05/21/16   Richardson Dopp T, PA-C  [employer] NASAL ALLERGY SPRAY 55 MCG/ACT AERO nasal inhaler USE 1 SPRAY IN  EACH NOSTRIL EVERY DAY AT NIGHT 01/04/16   [provider]  Dexlansoprazole 30 MG capsule Take 1 capsule (30 mg total) by mouth daily. 03/14/16   Nafziger, Tommi Rumps, NP  FLUoxetine (PROZAC) 40 MG capsule Take 60 mg by mouth daily.  02/20/16   [provider]  HYDROmorphone (DILAUDID) 2 MG tablet Take 2 mg by mouth every 4 (four) hours as needed for severe pain.    [provider]  metoprolol (LOPRESSOR) 50 MG tablet TAKE 1/2 TABLETS (25 MG TOTAL) BY MOUTH 2 (TWO) TIMES DAILY. 05/21/16   Richardson Dopp T, PA-C  metroNIDAZOLE (FLAGYL) 500 MG tablet Take 1 tablet (500 mg total) by mouth 2 (two) times daily. 12/12/16   Amundson Lorenz Coaster, Brook E, MD  NITROSTAT 0.4 MG  SL tablet PLACE 1 TABLET (0.4 MG TOTAL) UNDER THE TONGUE EVERY 5 (FIVE) MINUTES AS NEEDED FOR CHEST PAIN. 08/20/16   Fay Records, MD  NUCYNTA 50 MG tablet TAKE 1 TABLET BY MOUTH 3 TIMES A DAY AS NEEDED FOR PAIN 12/04/16   [provider]  PREMARIN vaginal cream USE 1/2 GRAM VAGINALLY EVERY NIGHT AT BEDTIME FOR THE 1ST 2 WEEKS,, THEN 1/2 GRAM 2-3 TIMES A WEEK 12/10/16   Regina Eck, CNM  Probiotic Product (PROBIOTIC PO) Take 1 capsule by mouth daily.     [provider]  simvastatin (ZOCOR) 80 MG tablet TAKE 1 TABLET BY MOUTH AT BEDTIME 12/02/16   Nafziger, Tommi Rumps, NP  UNABLE TO FIND Take 1 capsule by mouth daily. OTC Eye vitamin    [provider]  UNABLE TO FIND Inject 1 mL into the skin every 14 (fourteen) days. b12 shots twice a month at MDs office    [provider]  zolpidem (AMBIEN) 5 MG tablet TAKE 1 TABLET BY MOUTH AT BEDTIME 12/09/16   Nafziger, Tommi Rumps, NP    Allergies:   Oxycodone hcl; Penicillins; Aspirin; Macrobid [nitrofurantoin monohyd macro]; Sulfa antibiotics; and Tramadol   Social History   Social History  . Marital status: Married    Spouse name: N/A  . Number of children: N/A  . Years of education: N/A   Social History Main Topics  . Smoking status: Light Tobacco Smoker    Packs/day: 0.50    Years: 50.00    Types: Cigarettes  . Smokeless tobacco: Never Used  . Alcohol use No  . Drug use: No  . Sexual activity: Not Currently    Birth control/ protection: Post-menopausal   Other Topics Concern  . None   Social History Narrative   Is a homemaker   Married for 88 years    Has a daughter and son    Pets: Two dogs and a Neurosurgeon   Likes to garden ( flower beds).            Family History:  The patient's family history includes Cancer in her paternal grandfather; Coronary artery disease in her mother; Dementia in her father; Diabetes in her mother; Hypertension in her mother; Sudden death in her brother.   ROS:   Please see the  history of present illness.    ROS All other systems reviewed and are negative.   PHYSICAL EXAM:   VS:  BP 118/70   Pulse 65   Ht 5\' 1"  (1.549 m)   Wt 171 lb (77.6 kg)   LMP  (LMP Unknown)   SpO2 96%   BMI 32.31 kg/m    GEN: Well nourished, well developed, in no acute distress  HEENT: normal  Neck: no JVD, carotid bruits, or masses Cardiac: RRR; no murmurs, rubs, or gallops,no edema  Respiratory:  clear to auscultation bilaterally, normal work of breathing GI: soft, nontender, nondistended, + BS MS: no deformity or atrophy  Skin: warm and dry, no rash Neuro:  Alert and Oriented x 3, Strength and sensation are intact Psych: euthymic mood, full affect  Wt Readings from Last 3 Encounters:  12/29/16 171 lb (77.6 kg)  12/24/16 172 lb 11.2 oz (78.3 kg)  12/11/16 175 lb (79.4 kg)      Studies/Labs Reviewed:   EKG:  EKG is ordered today.  The ekg ordered today demonstrates NSR  Recent Labs: 02/20/2016: Pro B Natriuretic peptide (BNP) 79.0 10/31/2016: ALT 15; BUN 7; Creatinine, Ser 0.76; Hemoglobin 13.4; Platelets 292.0; Potassium 4.1; Sodium 137; TSH 6.21   Lipid Panel    Component Value Date/Time   CHOL 233 (H) 10/31/2016 0915   TRIG 316.0 (H) 10/31/2016 0915   HDL 48.80 10/31/2016 0915   CHOLHDL 5 10/31/2016 0915   VLDL 63.2 (H) 10/31/2016 0915   LDLCALC 106 (H) 01/03/2014 0939   LDLDIRECT 129.0 10/31/2016 0915    Additional studies/ records that were reviewed today include:   Echo 01/27/16 EF 55-60%, normal wall motion, trivial MR, trivial TR  Myoview 5/15  1. Normal myocardial perfusion study. No fixed or reversible defects. 2. Calculated ejection fraction of 67%.  LHC 05/18/12:  LAD mid 60-70%, D1 ostial 20%,  CFX mid 50%,  RCA prox to distal with multiple overlapping stents, mid 40-50% ISR, distal 60/95%, mPDA 80%,  EF 60%.  PCI: Xience expedition DES to the RCA and balloon angioplasty to the PDA.     ASSESSMENT & PLAN:   1. Chest pain,  unspecified - Unable to get accurate history due to recent memory issue. Seems her episode of chest discomfort resolved with sublingual nitroglycerin. Continue Plavix 75 mg, metoprolol, statin. Will get [employer]. She is not able to walk on treadmill due to chronic back issue.  2. CAD s/p multiple PCI as above - Continue plavix and statin  3. HTN - Well controlled on current regimen.  3. HLD - 10/31/2016: Cholesterol 233; HDL 48.80; Triglycerides 316.0; VLDL 63.2 LDL 129 - Compliant with low cholesterol diet. Continue Zocor 80 mg. We'll add Zetia 10 mg. She will need LFT and lipid check in 6 (this will be done by PCP per patient)  4. Tobacco abuse - "quit 2 days ago".  5. Abnormal thyroid level 10/2016 - recent weight gain. Needs repeat labs. F/u with PCP.   6. 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 Consider referral to neurology for further evaluation.     Medication Adjustments/Labs and Tests Ordered: Current medicines are reviewed at length with the patient today.  Concerns regarding medicines are outlined above.  Medication changes, Labs and Tests ordered today are listed in the Patient Instructions below. Patient Instructions  Medication Instructions:  Your physician has recommended you make the following change in your medication: 1.) START Zetia 10 mg daily.   Labwork: None Ordered   Testing/Procedures: Your physician has requested that you have a lexiscan myoview. For further information please visit [email or web address]. Please follow instruction sheet, as given.    Follow-Up: Your physician recommends that you schedule a follow-up appointment in: 3 months with Dr. Harrington Challenger  Any Other Special Instructions Will Be Listed Below (If Applicable).  Please follow up with  your PCP within 1 week.   If you need a refill on your cardiac medications before your next appointment, please  call your pharmacy.  Thank you for choosing [facility]        Signed, [place], [place]  12/29/2016 10:59 AM    [address], [place], [place]  [address] Phone: [phone]; Fax: [phone]

## 2016-12-29 ENCOUNTER — Telehealth (HOSPITAL_COMMUNITY): Payer: Self-pay | Admitting: *Deleted

## 2016-12-29 ENCOUNTER — Encounter: Payer: Self-pay | Admitting: Physician Assistant

## 2016-12-29 ENCOUNTER — Ambulatory Visit (INDEPENDENT_AMBULATORY_CARE_PROVIDER_SITE_OTHER): Payer: 59 | Admitting: Physician Assistant

## 2016-12-29 VITALS — BP 118/70 | HR 65 | Ht 61.0 in | Wt 171.0 lb

## 2016-12-29 DIAGNOSIS — F17210 Nicotine dependence, cigarettes, uncomplicated: Secondary | ICD-10-CM

## 2016-12-29 DIAGNOSIS — E538 Deficiency of other specified B group vitamins: Secondary | ICD-10-CM

## 2016-12-29 DIAGNOSIS — I2510 Atherosclerotic heart disease of native coronary artery without angina pectoris: Secondary | ICD-10-CM

## 2016-12-29 DIAGNOSIS — E782 Mixed hyperlipidemia: Secondary | ICD-10-CM | POA: Diagnosis not present

## 2016-12-29 DIAGNOSIS — R946 Abnormal results of thyroid function studies: Secondary | ICD-10-CM | POA: Diagnosis not present

## 2016-12-29 DIAGNOSIS — R079 Chest pain, unspecified: Secondary | ICD-10-CM | POA: Diagnosis not present

## 2016-12-29 DIAGNOSIS — R413 Other amnesia: Secondary | ICD-10-CM

## 2016-12-29 DIAGNOSIS — I10 Essential (primary) hypertension: Secondary | ICD-10-CM | POA: Diagnosis not present

## 2016-12-29 DIAGNOSIS — R7989 Other specified abnormal findings of blood chemistry: Secondary | ICD-10-CM

## 2016-12-29 MED ORDER — EZETIMIBE 10 MG PO TABS: 10.0000 mg | ORAL_TABLET | Freq: Every day | ORAL | 3 refills | Status: DC

## 2016-12-29 NOTE — Telephone Encounter (Signed)
Left message on voicemail per DPR in reference to upcoming appointment scheduled on 12/31/16 at [facility] with detailed instructions given per Myocardial Perfusion Study Information Sheet for the test. LM to arrive 15 minutes early, and that it is imperative to arrive on time for appointment to keep from having the test rescheduled. If you need to cancel or reschedule your appointment, please call the office within 24 hours of your appointment. Failure to do so may result in a cancellation of your appointment, and a $50 no show fee. Phone number given for call back for any questions.

## 2016-12-29 NOTE — Patient Instructions (Signed)
Medication Instructions:  Your physician has recommended you make the following change in your medication: 1.) START Zetia 10 mg daily.   Labwork: None Ordered   Testing/Procedures: Your physician has requested that you have a lexiscan myoview. For further information please visit [email or web address]. Please follow instruction sheet, as given.    Follow-Up: Your physician recommends that you schedule a follow-up appointment in: 3 months with Dr. Harrington Challenger  Any Other Special Instructions Will Be Listed Below (If Applicable).  Please follow up with your PCP within 1 week.   If you need a refill on your cardiac medications before your next appointment, please call your pharmacy.  Thank you for choosing [facility]

## 2016-12-31 ENCOUNTER — Ambulatory Visit (HOSPITAL_COMMUNITY): Payer: 59 | Attending: Cardiovascular Disease

## 2016-12-31 DIAGNOSIS — R079 Chest pain, unspecified: Secondary | ICD-10-CM | POA: Insufficient documentation

## 2016-12-31 LAB — MYOCARDIAL PERFUSION IMAGING
CHL CUP RESTING HR STRESS: 50 {beats}/min
CSEPPHR: 70 {beats}/min
LVDIAVOL: 76 mL (ref 46–106)
LVSYSVOL: 28 mL
RATE: 0.29
SDS: 2
SRS: 7
SSS: 9
TID: 0.91

## 2016-12-31 MED ORDER — REGADENOSON 0.4 MG/5ML IV SOLN
0.4000 mg | Freq: Once | INTRAVENOUS | Status: AC
  Administered 2016-12-31: 0.4 mg via INTRAVENOUS

## 2016-12-31 MED ORDER — TECHNETIUM TC 99M TETROFOSMIN IV KIT
31.5000 | PACK | Freq: Once | INTRAVENOUS | Status: AC | PRN
  Administered 2016-12-31: 31.5 via INTRAVENOUS
  Filled 2016-12-31: qty 32

## 2016-12-31 MED ORDER — TECHNETIUM TC 99M TETROFOSMIN IV KIT
10.2000 | PACK | Freq: Once | INTRAVENOUS | Status: AC | PRN
  Administered 2016-12-31: 10.2 via INTRAVENOUS
  Filled 2016-12-31: qty 11

## 2017-01-01 ENCOUNTER — Telehealth: Payer: Self-pay | Admitting: Physician Assistant

## 2017-01-01 NOTE — Telephone Encounter (Signed)
-----   Message from [place], [place] sent at 01/01/2017 10:55 AM EDT ----- Normal study. No ischemia.

## 2017-01-01 NOTE — Telephone Encounter (Signed)
New message  ° ° ° °Pt is returning call to Jennifer about results. °

## 2017-01-02 ENCOUNTER — Encounter (HOSPITAL_COMMUNITY): Payer: Self-pay | Admitting: Nurse Practitioner

## 2017-01-02 ENCOUNTER — Other Ambulatory Visit: Payer: Self-pay

## 2017-01-02 ENCOUNTER — Emergency Department (HOSPITAL_COMMUNITY)
Admission: EM | Admit: 2017-01-02 | Discharge: 2017-01-02 | Disposition: A | Discharge status: Home / Self Care | Payer: 59 | Attending: Emergency Medicine | Admitting: Emergency Medicine

## 2017-01-02 ENCOUNTER — Emergency Department (HOSPITAL_COMMUNITY): Payer: 59

## 2017-01-02 DIAGNOSIS — J45909 Unspecified asthma, uncomplicated: Secondary | ICD-10-CM | POA: Diagnosis not present

## 2017-01-02 DIAGNOSIS — Z79899 Other long term (current) drug therapy: Secondary | ICD-10-CM | POA: Diagnosis not present

## 2017-01-02 DIAGNOSIS — I2510 Atherosclerotic heart disease of native coronary artery without angina pectoris: Secondary | ICD-10-CM | POA: Insufficient documentation

## 2017-01-02 DIAGNOSIS — R079 Chest pain, unspecified: Secondary | ICD-10-CM | POA: Diagnosis present

## 2017-01-02 DIAGNOSIS — F17210 Nicotine dependence, cigarettes, uncomplicated: Secondary | ICD-10-CM | POA: Insufficient documentation

## 2017-01-02 DIAGNOSIS — Z955 Presence of coronary angioplasty implant and graft: Secondary | ICD-10-CM | POA: Insufficient documentation

## 2017-01-02 DIAGNOSIS — Z85828 Personal history of other malignant neoplasm of skin: Secondary | ICD-10-CM | POA: Diagnosis not present

## 2017-01-02 DIAGNOSIS — I252 Old myocardial infarction: Secondary | ICD-10-CM | POA: Diagnosis not present

## 2017-01-02 DIAGNOSIS — R0789 Other chest pain: Secondary | ICD-10-CM | POA: Diagnosis not present

## 2017-01-02 LAB — URINALYSIS, ROUTINE W REFLEX MICROSCOPIC
BILIRUBIN URINE: NEGATIVE
Glucose, UA: NEGATIVE mg/dL
KETONES UR: NEGATIVE mg/dL
LEUKOCYTES UA: NEGATIVE
NITRITE: NEGATIVE
PROTEIN: NEGATIVE mg/dL
Specific Gravity, Urine: 1.002 — ABNORMAL LOW (ref 1.005–1.030)
pH: 6 (ref 5.0–8.0)

## 2017-01-02 LAB — D-DIMER, QUANTITATIVE: D-Dimer, Quant: 0.68 ug/mL-FEU — ABNORMAL HIGH (ref 0.00–0.50)

## 2017-01-02 LAB — CBC
HEMATOCRIT: 39.5 % (ref 36.0–46.0)
Hemoglobin: 13.7 g/dL (ref 12.0–15.0)
MCH: 30.5 pg (ref 26.0–34.0)
MCHC: 34.7 g/dL (ref 30.0–36.0)
MCV: 88 fL (ref 78.0–100.0)
PLATELETS: 234 10*3/uL (ref 150–400)
RBC: 4.49 MIL/uL (ref 3.87–5.11)
RDW: 12.5 % (ref 11.5–15.5)
WBC: 6.4 10*3/uL (ref 4.0–10.5)

## 2017-01-02 LAB — PROTIME-INR
INR: 0.96
Prothrombin Time: 12.7 seconds (ref 11.4–15.2)

## 2017-01-02 LAB — BASIC METABOLIC PANEL
Anion gap: 9 (ref 5–15)
BUN: 7 mg/dL (ref 6–20)
CO2: 24 mmol/L (ref 22–32)
CREATININE: 0.72 mg/dL (ref 0.44–1.00)
Calcium: 9.3 mg/dL (ref 8.9–10.3)
Chloride: 103 mmol/L (ref 101–111)
GFR calc Af Amer: >60 mL/min (ref >60)
Glucose, Bld: 104 mg/dL — ABNORMAL HIGH (ref 65–99)
POTASSIUM: 4.3 mmol/L (ref 3.5–5.1)
SODIUM: 136 mmol/L (ref 135–145)

## 2017-01-02 LAB — I-STAT TROPONIN, ED
TROPONIN I, POC: 0.01 ng/mL (ref 0.00–0.08)
Troponin i, poc: 0 ng/mL (ref 0.00–0.08)

## 2017-01-02 LAB — HEPATIC FUNCTION PANEL
ALK PHOS: 80 U/L (ref 38–126)
ALT: 25 U/L (ref 14–54)
AST: 30 U/L (ref 15–41)
Albumin: 4.4 g/dL (ref 3.5–5.0)
Bilirubin, Direct: <0.1 mg/dL — ABNORMAL LOW (ref 0.1–0.5)
Total Bilirubin: 0.5 mg/dL (ref 0.3–1.2)
Total Protein: 7.8 g/dL (ref 6.5–8.1)

## 2017-01-02 LAB — LIPASE, BLOOD: Lipase: 18 U/L (ref 11–51)

## 2017-01-02 MED ORDER — GI COCKTAIL ~~LOC~~
30.0000 mL | Freq: Once | ORAL | Status: AC
  Administered 2017-01-02: 30 mL via ORAL
  Filled 2017-01-02: qty 30

## 2017-01-02 NOTE — ED Notes (Signed)
With assessment pt complaint of worsening central chest burning for past week; pt denies anything making pain worse/better. Pt continues to verbalize had stress test on Wednesday and all results came back normal but requesting further evaluation related to worsening pain onset yesterday. Pt describes pain as the same but just worse on pain scale.

## 2017-01-02 NOTE — Discharge Instructions (Signed)
Please schedule an appointment with your primary care physician and/or cardiologist for further management of your chest discomfort. Your workup today showed elevated d-dimer however our shared decision-making conversation led to decision of no CT scan and no further workup of your chest pain. Please continue taking your medications for your reflux. Please avoid your caffeinated drinks. If any symptoms change or worsen in any way, please return to the nearest emergency department for further management.

## 2017-01-02 NOTE — ED Provider Notes (Signed)
[facility] DEPT Provider Note   CSN: [id number] Arrival date & time: 01/02/17  1713     History   Chief Complaint Chief Complaint  Patient presents with  . Chest Pain    HPI Bianca MCCORRY is a 71 y.o. female.   The history is provided by the patient and medical records. No language interpreter was used.  Chest Pain   This is a new problem. The current episode started more than 1 week ago. The problem occurs constantly. The problem has not changed since onset.The pain is associated with eating. The pain is present in the substernal region. The pain is at a severity of 4/10. The pain is mild. The quality of the pain is described as burning. The pain radiates to the left neck. Pertinent negatives include no abdominal pain, no back pain, no cough, no diaphoresis, no dizziness, no fever, no headaches, no lower extremity edema, no malaise/fatigue, no nausea, no numbness, no palpitations, no shortness of breath, no sputum production and no vomiting. She has tried nothing for the symptoms. The treatment provided no relief.  Her past medical history is significant for CAD and MI.    Past Medical History:  Diagnosis Date  . Arthritis   . Blood transfusion without reported diagnosis   . CAD (coronary artery disease)    a. 1997 MI/PCI RCA;  b. 2007 MI/PCI of 100% RCA with Taxus DES x 3 placed, EF 55%;  c. 2010 Cath: stable anatomy;  d. 05/2012 NSTEMI/Cath/PCI: LM nl, LAD 60-54m, D1 20ost, LCX 68m, RCA 40-66m ISR, 60/95d (Treated w/ 2.75x33 Xience Xpedition DES), PDA 104m (Treated w/ PTCA), EF 60%.  . Carpal tunnel syndrome on both sides   . Chronic lower back pain   . Depression   . GERD (gastroesophageal reflux disease)   . Hyperlipidemia   . Myocardial infarction [facility]) 1997, 2007, 2013  . Numbness of foot    Left foot - back surgery 2009    Patient Active Problem List   Diagnosis Date Noted  . Upper airway cough syndrome 05/13/2016  . Lump in throat 04/17/2016  . RAD (reactive  airway disease) 04/01/2016  . Dyspnea 02/20/2016  . Hyponatremia 01/27/2016  . Bradycardia 01/27/2016  . B12 deficiency 07/11/2015  . Anemia associated with acute blood loss 05/28/2014  . Cigarette smoker 05/28/2014  . OA (osteoarthritis) of hip 05/24/2014  . Chest pain 12/19/2013  . History of non-ST elevation myocardial infarction (NSTEMI) 05/18/2012  . Allergic rhinitis due to pollen 04/01/2012  . Back pain 11/22/2010  . NEOPLASM OF UNCERTAIN BEHAVIOR OF SKIN 06/05/2010  . Swallowing difficulty 06/04/2010  . GERD 07/30/2009  . Hyperlipidemia 03/02/2007  . Depression 03/02/2007  . Coronary artery disease involving native heart without angina pectoris 03/02/2007    Past Surgical History:  Procedure Laterality Date  . ANKLE SURGERY  Years ago   Left; "had to take out a floater"  . CARPAL TUNNEL RELEASE Left 10/12016  . CESAREAN SECTION  1990  . CORONARY ANGIOPLASTY WITH STENT PLACEMENT  1997; 2007, 2013   "2 + 2; + 1= total of 5  . LEFT HEART CATHETERIZATION WITH CORONARY ANGIOGRAM N/A 05/18/2012   Procedure: LEFT HEART CATHETERIZATION WITH CORONARY ANGIOGRAM;  Surgeon: Wellington Hampshire, MD;  Location: [facility] CATH LAB;  Service: Cardiovascular;  Laterality: N/A;  . [place]; 2009  . TOTAL HIP ARTHROPLASTY Right 05/24/2014   Procedure: RIGHT TOTAL HIP ARTHROPLASTY ANTERIOR APPROACH;  Surgeon: Gearlean Alf, MD;  Location: [facility];  Service: Orthopedics;  Laterality: Right;    OB History    Gravida Para Term Preterm AB Living   2 2       2    SAB TAB Ectopic Multiple Live Births                   Home Medications    Prior to Admission medications   Medication Sig Start Date End Date Taking? Authorizing Provider  buPROPion (WELLBUTRIN XL) 150 MG 24 hr tablet TAKE 1 TABLET BY MOUTH EVERY DAY IN THE MORNING 11/11/16  Yes [provider]  clopidogrel (PLAVIX) 75 MG tablet Take 1 tablet (75 mg total) by mouth daily. 05/21/16  Yes Weaver, Scott T,  PA-C  Dexlansoprazole 30 MG capsule Take 1 capsule (30 mg total) by mouth daily. 03/14/16  Yes Nafziger, Tommi Rumps, NP  ezetimibe (ZETIA) 10 MG tablet Take 1 tablet (10 mg total) by mouth daily. 12/29/16 03/29/17 Yes Bhagat, Bhavinkumar, PA  FLUoxetine HCl 60 MG TABS Take 60 mg by mouth daily.  02/20/16  Yes [provider]  fluticasone (FLONASE) 50 MCG/ACT nasal spray Place 2 sprays into both nostrils daily as needed for allergies or rhinitis.   Yes [provider]  HYDROmorphone (DILAUDID) 2 MG tablet Take 2 mg by mouth every 4 (four) hours as needed for severe pain.   Yes [provider]  metoprolol (LOPRESSOR) 50 MG tablet TAKE 1/2 TABLETS (25 MG TOTAL) BY MOUTH 2 (TWO) TIMES DAILY. 05/21/16  Yes Weaver, Scott T, PA-C  NITROSTAT 0.4 MG SL tablet PLACE 1 TABLET (0.4 MG TOTAL) UNDER THE TONGUE EVERY 5 (FIVE) MINUTES AS NEEDED FOR CHEST PAIN. 08/20/16  Yes Fay Records, MD  Probiotic Product (PROBIOTIC PO) Take 1 capsule by mouth daily. [employer] brand   Yes [provider]  simvastatin (ZOCOR) 80 MG tablet TAKE 1 TABLET BY MOUTH AT BEDTIME 12/02/16  Yes Nafziger, Tommi Rumps, NP  UNABLE TO FIND Inject 1 mL into the skin every 30 (thirty) days. b12 shots twice a month at MDs office   Yes [provider]  zolpidem (AMBIEN) 5 MG tablet TAKE 1 TABLET BY MOUTH AT BEDTIME 12/09/16  Yes Nafziger, Tommi Rumps, NP  metroNIDAZOLE (FLAGYL) 500 MG tablet Take 1 tablet (500 mg total) by mouth 2 (two) times daily. Patient not taking: Reported on 01/02/2017 12/12/16   Nunzio Cobbs, MD  PREMARIN vaginal cream USE 1/2 GRAM VAGINALLY EVERY NIGHT AT BEDTIME FOR THE 1ST 2 WEEKS,, THEN 1/2 GRAM 2-3 TIMES A WEEK 12/10/16   Regina Eck, CNM    Family History Family History  Problem Relation Age of Onset  . Cancer Paternal Grandfather        Esophageal  . Coronary artery disease Mother   . Diabetes Mother   . Hypertension Mother   . Dementia Father   . Sudden death Brother         Suicide    Social History Social History  Substance Use Topics  . Smoking status: Light Tobacco Smoker    Packs/day: 0.50    Years: 50.00    Types: Cigarettes  . Smokeless tobacco: Never Used  . Alcohol use No     Allergies   Oxycodone hcl; Penicillins; Aspirin; Macrobid [nitrofurantoin monohyd macro]; Sulfa antibiotics; and Tramadol   Review of Systems Review of Systems  Constitutional: Negative for appetite change, chills, diaphoresis, fever and malaise/fatigue.  HENT: Negative for congestion and rhinorrhea.   Eyes:  Negative for visual disturbance.  Respiratory: Negative for cough, sputum production, chest tightness, shortness of breath, wheezing and stridor.   Cardiovascular: Positive for chest pain. Negative for palpitations.  Gastrointestinal: Negative for abdominal pain, constipation, diarrhea, nausea and vomiting.  Genitourinary: Negative for dyspareunia, flank pain and frequency.  Musculoskeletal: Negative for back pain, neck pain and neck stiffness.  Skin: Negative for rash and wound.  Neurological: Negative for dizziness, numbness and headaches.  Psychiatric/Behavioral: Negative for agitation.  All other systems reviewed and are negative.    Physical Exam Updated Vital Signs BP 118/70 Comment: O2 w/ good waveform shows O2 of 99% on RA  Pulse (!) 44 Comment: O2 w/ good waveform shows O2 of 99% on RA  Temp 98.3 F (36.8 C) (Oral)   Resp 16 Comment: O2 w/ good waveform shows O2 of 99% on RA  Ht 5\' 1"  (1.549 m)   Wt 168 lb (76.2 kg)   LMP  (LMP Unknown)   SpO2 (!) 87% Comment: O2 w/ good waveform shows O2 of 99% on RA  BMI 31.74 kg/m   Physical Exam  Constitutional: She appears well-developed and well-nourished. No distress.  HENT:  Head: Normocephalic and atraumatic.  Mouth/Throat: Oropharynx is clear and moist. No oropharyngeal exudate.  Eyes: Conjunctivae and EOM are normal. Pupils are equal, round, and reactive to light.  Neck: Normal range of  motion. Neck supple.  Cardiovascular: Normal rate, regular rhythm and intact distal pulses.   No murmur heard. Pulmonary/Chest: Effort normal and breath sounds normal. No stridor. No respiratory distress. She has no wheezes. She has no rales. She exhibits no tenderness.  Abdominal: Soft. There is no tenderness.  Musculoskeletal: She exhibits no edema or tenderness.  Neurological: She is alert. No sensory deficit. She exhibits normal muscle tone.  Skin: Skin is warm and dry. Capillary refill takes less than 2 seconds. No rash noted. She is not diaphoretic. No erythema.  Psychiatric: She has a normal mood and affect.  Nursing note and vitals reviewed.    ED Treatments / Results  Labs (all labs ordered are listed, but only abnormal results are displayed) Labs Reviewed  BASIC METABOLIC PANEL - Abnormal; Notable for the following:       Result Value   Glucose, Bld 104 (*)    All other components within normal limits  D-DIMER, QUANTITATIVE (NOT AT [facility]) - Abnormal; Notable for the following:    D-Dimer, Quant 0.68 (*)    All other components within normal limits  URINALYSIS, ROUTINE W REFLEX MICROSCOPIC - Abnormal; Notable for the following:    Color, Urine COLORLESS (*)    Specific Gravity, Urine 1.002 (*)    Hgb urine dipstick SMALL (*)    Bacteria, UA RARE (*)    Squamous Epithelial / LPF 0-5 (*)    All other components within normal limits  HEPATIC FUNCTION PANEL - Abnormal; Notable for the following:    Bilirubin, Direct <0.1 (*)    All other components within normal limits  CBC  PROTIME-INR  LIPASE, BLOOD  I-STAT TROPOININ, ED  I-STAT TROPOININ, ED    EKG  EKG Interpretation None      ED ECG REPORT   Date: 01/03/2017  Rate: 53  Rhythm: normal sinus rhythm  QRS Axis: normal  Intervals: normal  ST/T Wave abnormalities: nonspecific T wave changes  Conduction Disutrbances:none  Narrative Interpretation:   Old EKG Reviewed: unchanged  I have personally reviewed  the EKG tracing and agree with the computerized printout as noted.  Radiology Dg Chest 2 View  Result Date: 01/02/2017 CLINICAL DATA:  Worsening chest pain. EXAM: CHEST  2 VIEW COMPARISON:  01/26/2016 FINDINGS: Lungs are adequately inflated without focal consolidation or effusion. Cardiomediastinal silhouette and remainder of the exam is unchanged. IMPRESSION: No active cardiopulmonary disease. Electronically Signed   By: Marin Olp M.D.   On: 01/02/2017 18:10    Procedures Procedures (including critical care time)  Medications Ordered in ED Medications  gi cocktail (Maalox,Lidocaine,Donnatal) (30 mLs Oral Given 01/02/17 2047)     Initial Impression / Assessment and Plan / ED Course  I have reviewed the triage vital signs and the nursing notes.  Pertinent labs & imaging results that were available during my care of the patient were reviewed by me and considered in my medical decision making (see chart for details).     Bianca [name] is a 71 y.o. female a past medical history significant for CAD with MI status post 5X PCI, GERD, hyperlipidemia, who presents with chest pain and lightheadedness. Patient reports that she has had roughly one week of a chest discomfort that she describes as a central chest burning radiating towards her throat. She describes it as an 8 out of 10 severity but is currently 4-10. She said that she is saw her cardiologist several days ago and had a stress test performed on Wednesday and was reportedly normal. Patient says that her chest discomfort is burning. She said it feels similar to reflux in the past. She says it does not feel like her prior heart attacks. She reported some mild shortness of breath when she was having it worsened but says that has resolved. She denies any nausea, vomiting, constipation, or diarrhea. She does report some chronic dysuria. She denies fevers or chills. She denies any lower extremity pain or swelling. She says it has no history  of blood clots in her legs or lungs.  History and exam are seen above. Patient's lungs are clear. Chest is nontender. Abdomen is nontender. Lower extremity is were nonedematous. No swelling or tenderness in the legs. Pulses are symmetric bilaterally upper extremity.  Based on patient's description of symptoms, suspect reflux given her history of similar, similar to prior, and description of symptoms. However, given the patient's significant cardiac history, she will have laboratory testing, chest x-ray, and for her., Urinalysis. Patient was given a GI cocktail to try and alleviate her symptoms. Given her recent normal stress test, if her labs are reassuring, suspect patient will be able to be discharged.   Patient reported near complete resolution of her chest pain after GI cocktail. Patient's laboratory testing results are seen above. Troponin negative 2. D-dimer similar to prior but slightly elevated. Shared decision-making conversation held with patient and she does not want to go through CT imaging to look for pulmonary embolism at this time. Patient understands risks of missing a blood clot.  Other laboratory is doing was reassuring. No evidence of cute infection. Patient denies any urinary symptoms, doubt UTI. Chest x-ray shows no pneumonia or pneumothorax.  Given improvement in symptoms and patient's report that it feels like prior GERD, patient was to be discharged. Patient was advised that with her history of ACS, MI, and PCI, any chest pain is concerning however, patient was to be discharged. Patient understands risk of death this is a cardiac or pulmonary embolism cause of symptoms. Patient will follow-up with her PCP for further management. Strict return precautions were given and clearly understood and repeated back. Patient had no  other questions or concerns and patient was discharged in good condition for likely reflux causing her chest burning.    Final Clinical Impressions(s) / ED  Diagnoses   Final diagnoses:  Burning chest pain    New Prescriptions Discharge Medication List as of 01/02/2017 10:47 PM      Clinical Impression: 1. Burning chest pain     Disposition: Discharge  Condition: Good  I have discussed the results, Dx and Tx plan with the pt(& family if present). He/she/they expressed understanding and agree(s) with the plan. Discharge instructions discussed at great length. Strict return precautions discussed and pt &/or family have verbalized understanding of the instructions. No further questions at time of discharge.    Discharge Medication List as of 01/02/2017 10:47 PM      Follow Up: [name], NP [place] [address] [phone]  Schedule an appointment as soon as possible for a visit    [facility] DEPT [place] [phone] [place]  If symptoms worsen     Tegeler, Gwenyth Allegra, MD 01/03/17 [phone]

## 2017-01-02 NOTE — ED Triage Notes (Signed)
Pt is c/o chest pain, states she had a stress test 2 days ago and received the results from the cardiologist stating "everything looked normal." States the chest pain is worsening and would like to be evaluated. She is also c/o of suprapubic pain and dysuria that she is describing as burning when she voids.

## 2017-01-06 ENCOUNTER — Telehealth: Payer: Self-pay | Admitting: Emergency Medicine

## 2017-01-06 ENCOUNTER — Ambulatory Visit (INDEPENDENT_AMBULATORY_CARE_PROVIDER_SITE_OTHER): Payer: 59 | Admitting: Physician Assistant

## 2017-01-06 ENCOUNTER — Encounter: Payer: Self-pay | Admitting: Physician Assistant

## 2017-01-06 VITALS — BP 120/60 | HR 62 | Ht 61.0 in | Wt 170.0 lb

## 2017-01-06 DIAGNOSIS — R1013 Epigastric pain: Secondary | ICD-10-CM

## 2017-01-06 DIAGNOSIS — R1310 Dysphagia, unspecified: Secondary | ICD-10-CM

## 2017-01-06 DIAGNOSIS — K219 Gastro-esophageal reflux disease without esophagitis: Secondary | ICD-10-CM

## 2017-01-06 MED ORDER — DEXLANSOPRAZOLE 60 MG PO CPDR: 60.0000 mg | DELAYED_RELEASE_CAPSULE | Freq: Every day | ORAL | 2 refills | Status: DC

## 2017-01-06 NOTE — Telephone Encounter (Signed)
   CLORA OHMER 1945/10/20 [id number]  Dear Dr.Weaver:  We have scheduled the above named patient for a(n) endoscopy procedure. Our records show that (s)he is on anticoagulation therapy.  Please advise as to whether the patient may come off their therapy of Plavix 5 days prior to their procedure which is scheduled for 01-16-17.  Please route your response to Tinnie Gens, CMA or fax response to [phone].  Sincerely,    [facility]

## 2017-01-06 NOTE — Telephone Encounter (Signed)
Patient seen by Leanor Kail, PA-C for evaluation of chest pain.   Will forward to him for his input regarding whether or not Plavix can be held for her GI procedure.  Richardson Dopp, PA-C    01/06/2017 5:29 PM

## 2017-01-06 NOTE — Patient Instructions (Signed)
You have been scheduled for an endoscopy. Please follow written instructions given to you at your visit today. If you use inhalers (even only as needed), please bring them with you on the day of your procedure. Your physician has requested that you go to www.startemmi.com and enter the access code given to you at your visit today. This web site gives a general overview about your procedure. However, you should still follow specific instructions given to you by our office regarding your preparation for the procedure.  We have sent the following medications to your pharmacy for you to pick up at your convenience: Dexilant 60 mg daily  

## 2017-01-06 NOTE — Progress Notes (Signed)
I agree with the above note, plan 

## 2017-01-06 NOTE — Progress Notes (Signed)
Chief Complaint: Epigastric/chest pain, globus sensation  HPI:  Bianca [name] is a 71 year old Caucasian female with a past medical history of CAD with a last EF of 55% on Plavix, depression, reflux and others listed below,  who was referred to me by [name], NP for a complaint of epigastric/chest pain as well as a globus sensation .      Per chart review patient was seen in the ED on 01/02/17 with complaint of pain in the substernal region rated as a 4/10. This is described as burning and radiating to the left neck. With history of CAD and previous MI patient had cardiac workup which was negative. She also had a chest x-ray which showed no active cardiopulmonary disease. She was given GI cocktail and reflux was suspected.    Patient previously followed in our clinic with Dr. Sharlett Iles with an endoscopy in 2011 with dilation of a stricture as well as chronic GERD and hiatal hernia. Colonoscopy was done that same year and was completely normal with repeat recommended in 10 years.   Today, the patient presents to clinic accompanied by her daughter who does assist with her history. Her daughter tells me that this same complaint has been occurring for 2 years "off and on". Apparently they have been going to various doctors including pulmonologist and others to have different things ruled out and everyone seems to be pointing to reflux "although my mom doesn't believe it". The patient describes that previous to last Friday she would have episodes of a pain which seemed to start on the right side of her chest and radiate all the way across, "like a wave", and would typically only last for a few seconds to a minute. Most recently this pain started last Friday and has not gone away since. She describes it as a "burning pressure". This seems to be no worse when she eats but is somewhat exaggerated with a lot of movement. She denies any shortness of breath or overt chest pain. She does have a history of reflux  for which she uses Dexlansoprazole 30 mg daily with some breakthrough. She also relays a sensation of feeling like "there is a lump in my throat all the time". She also describes some trouble with her pills seeming to stick in her throat. She does not recall the endoscopy she had in 2011 with dilation.   Patient denies fever, chills, blood in her stool, melena, weight loss, fatigue, anorexia, change in bowel habits, change in diet, recent addition of medications, nausea, vomiting or symptoms that awaken her at night.    Past Medical History:  Diagnosis Date  . Arthritis   . Blood transfusion without reported diagnosis   . CAD (coronary artery disease)    a. 1997 MI/PCI RCA;  b. 2007 MI/PCI of 100% RCA with Taxus DES x 3 placed, EF 55%;  c. 2010 Cath: stable anatomy;  d. 05/2012 NSTEMI/Cath/PCI: LM nl, LAD 60-56m, D1 20ost, LCX 20m, RCA 40-48m ISR, 60/95d (Treated w/ 2.75x33 Xience Xpedition DES), PDA 44m (Treated w/ PTCA), EF 60%.  . Carpal tunnel syndrome on both sides   . Chronic lower back pain   . Depression   . GERD (gastroesophageal reflux disease)   . Hyperlipidemia   . Myocardial infarction [facility]) 1997, 2007, 2013  . Numbness of foot    Left foot - back surgery 2009    Past Surgical History:  Procedure Laterality Date  . ANKLE SURGERY  Years ago   Left; "had to  take out a floater"  . CARPAL TUNNEL RELEASE Left 10/12016  . CESAREAN SECTION  1990  . CORONARY ANGIOPLASTY WITH STENT PLACEMENT  1997; 2007, 2013   "2 + 2; + 1= total of 5  . LEFT HEART CATHETERIZATION WITH CORONARY ANGIOGRAM N/A 05/18/2012   Procedure: LEFT HEART CATHETERIZATION WITH CORONARY ANGIOGRAM;  Surgeon: Wellington Hampshire, MD;  Location: [place] CATH LAB;  Service: Cardiovascular;  Laterality: N/A;  . [place]; 2009  . TOTAL HIP ARTHROPLASTY Right 05/24/2014   Procedure: RIGHT TOTAL HIP ARTHROPLASTY ANTERIOR APPROACH;  Surgeon: Gearlean Alf, MD;  Location: [facility];  Service: Orthopedics;   Laterality: Right;    Current Outpatient Prescriptions  Medication Sig Dispense Refill  . buPROPion (WELLBUTRIN XL) 150 MG 24 hr tablet TAKE 1 TABLET BY MOUTH EVERY DAY IN THE MORNING  1  . clopidogrel (PLAVIX) 75 MG tablet Take 1 tablet (75 mg total) by mouth daily. 90 tablet 3  . ezetimibe (ZETIA) 10 MG tablet Take 1 tablet (10 mg total) by mouth daily. 90 tablet 3  . FLUoxetine HCl 60 MG TABS Take 60 mg by mouth daily.     . fluticasone (FLONASE) 50 MCG/ACT nasal spray Place 2 sprays into both nostrils daily as needed for allergies or rhinitis.    Marland Kitchen HYDROmorphone (DILAUDID) 2 MG tablet Take 2 mg by mouth every 4 (four) hours as needed for severe pain.    . metoprolol (LOPRESSOR) 50 MG tablet TAKE 1/2 TABLETS (25 MG TOTAL) BY MOUTH 2 (TWO) TIMES DAILY. 90 tablet 0  . NITROSTAT 0.4 MG SL tablet PLACE 1 TABLET (0.4 MG TOTAL) UNDER THE TONGUE EVERY 5 (FIVE) MINUTES AS NEEDED FOR CHEST PAIN. 25 tablet 0  . PREMARIN vaginal cream USE 1/2 GRAM VAGINALLY EVERY NIGHT AT BEDTIME FOR THE 1ST 2 WEEKS,, THEN 1/2 GRAM 2-3 TIMES A WEEK 30 g 1  . Probiotic Product (PROBIOTIC PO) Take 1 capsule by mouth daily. Nature Bounty's brand    . simvastatin (ZOCOR) 80 MG tablet TAKE 1 TABLET BY MOUTH AT BEDTIME 30 tablet 5  . UNABLE TO FIND Inject 1 mL into the skin every 30 (thirty) days. b12 shots twice a month at MDs office    . zolpidem (AMBIEN) 5 MG tablet TAKE 1 TABLET BY MOUTH AT BEDTIME 30 tablet 0  . dexlansoprazole (DEXILANT) 60 MG capsule Take 1 capsule (60 mg total) by mouth daily. 30 capsule 2   No current facility-administered medications for this visit.     Allergies as of 01/06/2017 - Review Complete 01/06/2017  Allergen Reaction Noted  . Oxycodone hcl Itching and Other (See Comments) 02/23/2008  . Penicillins Itching and Rash 08/25/2006  . Aspirin  12/11/2015  . Macrobid [nitrofurantoin monohyd macro] Nausea And Vomiting 12/23/2013  . Sulfa antibiotics Nausea Only 12/28/2013  . Tramadol  Itching 12/11/2015    Family History  Problem Relation Age of Onset  . Cancer Paternal Grandfather        Esophageal  . Coronary artery disease Mother   . Diabetes Mother   . Hypertension Mother   . Dementia Father   . Sudden death Brother        Suicide    Social History   Social History  . Marital status: Married    Spouse name: N/A  . Number of children: N/A  . Years of education: N/A   Occupational History  . Not on file.   Social History Main Topics  .  Smoking status: Light Tobacco Smoker    Packs/day: 0.50    Years: 50.00    Types: Cigarettes  . Smokeless tobacco: Never Used  . Alcohol use No  . Drug use: No  . Sexual activity: Not Currently    Birth control/ protection: Post-menopausal   Other Topics Concern  . Not on file   Social History Narrative   Is a homemaker   Married for 67 years    Has a daughter and son    Pets: Two dogs and a Neurosurgeon   Likes to garden ( flower beds).           Review of Systems:    Constitutional: No weight loss, fever or chills Skin: No rash  Cardiovascular: Positive for chest pain Respiratory: No SOB  Gastrointestinal: See HPI and otherwise negative Genitourinary: No dysuria  Neurological: No headache Musculoskeletal: No new muscle or joint pain Hematologic: No bleeding  Psychiatric: No history of depression or anxiety   Physical Exam:  Vital signs: BP 120/60   Pulse 62   Ht 5\' 1"  (1.549 m)   Wt 170 lb (77.1 kg)   LMP  (LMP Unknown)   BMI 32.12 kg/m   Constitutional:   Pleasant Caucasian female appears to be in NAD, Well developed, Well nourished, alert and cooperative Head:  Normocephalic and atraumatic. Eyes:   PEERL, EOMI. No icterus. Conjunctiva pink. Ears:  Normal auditory acuity. Neck:  Supple Throat: Oral cavity and pharynx without inflammation, swelling or lesion.  Respiratory: Respirations even and unlabored. Lungs clear to auscultation bilaterally.   No wheezes, crackles, or rhonchi.    Cardiovascular: Normal S1, S2. No MRG. Regular rate and rhythm. No peripheral edema, cyanosis or pallor.  Gastrointestinal:  Soft, nondistended, mild epigastric TTP No rebound or guarding. Normal bowel sounds. No appreciable masses or hepatomegaly. Rectal:  Not performed.  Msk:  Symmetrical without gross deformities. Without edema, no deformity or joint abnormality.  Neurologic:  Alert and  oriented x4;  grossly normal neurologically.  Skin:   Dry and intact without significant lesions or rashes. Psychiatric:  Demonstrates good judgement and reason without abnormal affect or behaviors.  MOST RECENT LABS: CBC    Component Value Date/Time   WBC 6.4 01/02/2017 1810   RBC 4.49 01/02/2017 1810   HGB 13.7 01/02/2017 1810   HCT 39.5 01/02/2017 1810   PLT 234 01/02/2017 1810   MCV 88.0 01/02/2017 1810   MCH 30.5 01/02/2017 1810   MCHC 34.7 01/02/2017 1810   RDW 12.5 01/02/2017 1810   LYMPHSABS 2.7 10/31/2016 0915   MONOABS 0.6 10/31/2016 0915   EOSABS 0.2 10/31/2016 0915   BASOSABS 0.0 10/31/2016 0915    CMP     Component Value Date/Time   NA 136 01/02/2017 1810   K 4.3 01/02/2017 1810   CL 103 01/02/2017 1810   CO2 24 01/02/2017 1810   GLUCOSE 104 (H) 01/02/2017 1810   BUN 7 01/02/2017 1810   CREATININE 0.72 01/02/2017 1810   CALCIUM 9.3 01/02/2017 1810   PROT 7.8 01/02/2017 2050   ALBUMIN 4.4 01/02/2017 2050   AST 30 01/02/2017 2050   ALT 25 01/02/2017 2050   ALKPHOS 80 01/02/2017 2050   BILITOT 0.5 01/02/2017 2050   GFRNONAA >60 01/02/2017 1810   GFRAA >60 01/02/2017 1810    Assessment: 1. Epigastric pain: This has been occurring intermittently over the past 2 years, patient has been evaluated by pulmonology and had cardiac workup which were negative per her, there were suggestions  of relation to reflux, history of chronic gastritis on EGD in 2011 as well as stricture which was dilated, patient also complains of some dysphagia and a globus sensation; Consider GERD vs  gastritis versus other 2. Reflux: See above; moderately controlled on Dexilant 30 mg daily with some breakthrough symptoms 3. Dysphagia: Mainly to pills, but sometimes with food, worse over the past year; history of stricture, likely this is the same  Plan: 1. Scheduled patient for an EGD with dialtion in the [place] with Dr. Ardis Hughs as he is supervising this morning. Did discuss risks, benefits, limitations and alternatives and the patient agrees to proceed. 2. Increased patient's Dexilant from 30 mg daily to 60 mg daily provided her with a prescription for 30 days with one refill 3. Reviewed antireflux diet and lifestyle modifications. 4. Reviewed anti-dysphagia measures 5. Patient to follow in clinic per Dr. Ardis Hughs recommendations after time of procedure.  Ellouise Newer, PA-C [facility] 01/06/2017, 9:59 AM  Cc: [name], NP

## 2017-01-07 ENCOUNTER — Other Ambulatory Visit: Payer: Self-pay | Admitting: Adult Health

## 2017-01-07 DIAGNOSIS — G4700 Insomnia, unspecified: Secondary | ICD-10-CM

## 2017-01-08 ENCOUNTER — Other Ambulatory Visit: Payer: Self-pay | Admitting: Adult Health

## 2017-01-08 NOTE — Telephone Encounter (Signed)
Ok to refill. Please advise patient that she never followed up for her physical, I am going to need to see her to retest her thyroid.

## 2017-01-08 NOTE — Telephone Encounter (Signed)
Rx has been called in as directed.  Patient has been scheduled for CPE

## 2017-01-08 NOTE — Telephone Encounter (Addendum)
Pt following up on refill request.  Thanks  [facility]/ [address]  Pt scheduled cpe

## 2017-01-09 NOTE — Telephone Encounter (Signed)
OK to stop plavix.

## 2017-01-09 NOTE — Telephone Encounter (Signed)
Patient informed and verbalized understanding

## 2017-01-13 ENCOUNTER — Encounter: Payer: Self-pay | Admitting: Gastroenterology

## 2017-01-13 ENCOUNTER — Ambulatory Visit (INDEPENDENT_AMBULATORY_CARE_PROVIDER_SITE_OTHER): Payer: 59 | Admitting: Adult Health

## 2017-01-13 VITALS — BP 132/70 | Temp 98.1°F | Ht 61.0 in | Wt 175.0 lb

## 2017-01-13 DIAGNOSIS — R4189 Other symptoms and signs involving cognitive functions and awareness: Secondary | ICD-10-CM | POA: Diagnosis not present

## 2017-01-13 DIAGNOSIS — E039 Hypothyroidism, unspecified: Secondary | ICD-10-CM

## 2017-01-13 DIAGNOSIS — Z0000 Encounter for general adult medical examination without abnormal findings: Secondary | ICD-10-CM

## 2017-01-13 LAB — TSH: TSH: 1.59 u[IU]/mL (ref 0.35–4.50)

## 2017-01-13 LAB — T4, FREE: Free T4: 0.67 ng/dL (ref 0.60–1.60)

## 2017-01-13 LAB — T3, FREE: T3, Free: 3.3 pg/mL (ref 2.3–4.2)

## 2017-01-13 NOTE — Patient Instructions (Signed)
It was great seeing you today   I am going to check your thyroid levels today   I would like you to cut back on Ambian to see if this is what is effecting your memory.   Follow up with me in one month

## 2017-01-13 NOTE — Progress Notes (Signed)
Subjective:    Patient ID: Bianca [name], female    DOB: 21-Jul-1946, 71 y.o.   MRN: [id number]  HPI  71 year old female who  has a past medical history of Arthritis; Blood transfusion without reported diagnosis; CAD (coronary artery disease); Carpal tunnel syndrome on both sides; Chronic lower back pain; Depression; GERD (gastroesophageal reflux disease); Hyperlipidemia; Myocardial infarction [facility]) (1997, 2007, 2013); and Numbness of foot. She presents to the clinic today to discuss the issue of cognitive impairment   Her complaint is that of short term memory loss - this has been an issue for over two years. She feels as though her memory issues have been becoming gradually worse. She feels as though " I cannot always remember where I am when I am driving, but it is only for a split second."  She denies getting lost while driving. She has forgotten where she puts her keys and purse but this is not often. She takes dilaudid and ambien chronically for pain and insomnia. She is also feeling as though she is forgetting about appointments. She had her physical labs done about two months ago, canceled her physical and never rescheduled. She does not remember cancelling her appointment.   When reviewing her physical labs it was noticed that her TSH was slightly elevated. I will recheck this today     Review of Systems  Constitutional: Negative.   HENT: Negative.   Eyes: Negative.   Respiratory: Negative.   Cardiovascular: Negative.   Gastrointestinal: Negative.   Endocrine: Negative.   Genitourinary: Positive for pelvic pain (chronic ).  Musculoskeletal: Positive for arthralgias and back pain.  Skin: Negative.   Allergic/Immunologic: Negative.   Neurological: Negative.   Hematological: Negative.   Psychiatric/Behavioral: Positive for confusion and decreased concentration.  All other systems reviewed and are negative.  Past Medical History:  Diagnosis Date  . Arthritis   . Blood  transfusion without reported diagnosis   . CAD (coronary artery disease)    a. 1997 MI/PCI RCA;  b. 2007 MI/PCI of 100% RCA with Taxus DES x 3 placed, EF 55%;  c. 2010 Cath: stable anatomy;  d. 05/2012 NSTEMI/Cath/PCI: LM nl, LAD 60-55m, D1 20ost, LCX 46m, RCA 40-39m ISR, 60/95d (Treated w/ 2.75x33 Xience Xpedition DES), PDA 72m (Treated w/ PTCA), EF 60%.  . Carpal tunnel syndrome on both sides   . Chronic lower back pain   . Depression   . GERD (gastroesophageal reflux disease)   . Hyperlipidemia   . Myocardial infarction [facility]) 1997, 2007, 2013  . Numbness of foot    Left foot - back surgery 2009    Social History   Social History  . Marital status: Married    Spouse name: N/A  . Number of children: N/A  . Years of education: N/A   Occupational History  . Not on file.   Social History Main Topics  . Smoking status: Light Tobacco Smoker    Packs/day: 0.50    Years: 50.00    Types: Cigarettes  . Smokeless tobacco: Never Used  . Alcohol use No  . Drug use: No  . Sexual activity: Not Currently    Birth control/ protection: Post-menopausal   Other Topics Concern  . Not on file   Social History Narrative   Is a homemaker   Married for 29 years    Has a daughter and son    Pets: Two dogs and a Neurosurgeon   Likes to garden ( flower beds).  Past Surgical History:  Procedure Laterality Date  . ANKLE SURGERY  Years ago   Left; "had to take out a floater"  . CARPAL TUNNEL RELEASE Left 10/12016  . CESAREAN SECTION  1990  . CORONARY ANGIOPLASTY WITH STENT PLACEMENT  1997; 2007, 2013   "2 + 2; + 1= total of 5  . LEFT HEART CATHETERIZATION WITH CORONARY ANGIOGRAM N/A 05/18/2012   Procedure: LEFT HEART CATHETERIZATION WITH CORONARY ANGIOGRAM;  Surgeon: Wellington Hampshire, MD;  Location: [place] CATH LAB;  Service: Cardiovascular;  Laterality: N/A;  . [place]; 2009  . TOTAL HIP ARTHROPLASTY Right 05/24/2014   Procedure: RIGHT TOTAL HIP ARTHROPLASTY  ANTERIOR APPROACH;  Surgeon: Gearlean Alf, MD;  Location: [facility];  Service: Orthopedics;  Laterality: Right;    Family History  Problem Relation Age of Onset  . Cancer Paternal Grandfather        Esophageal  . Coronary artery disease Mother   . Diabetes Mother   . Hypertension Mother   . Dementia Father   . Sudden death Brother        Suicide    Allergies  Allergen Reactions  . Oxycodone Hcl Itching and Other (See Comments)    confusion  . Penicillins Itching and Rash    Has patient had a PCN reaction causing immediate rash, facial/tongue/throat swelling, SOB or lightheadedness with hypotension: yes rash that took a while to go away across the abdomen Has patient had a PCN reaction causing severe rash involving mucus membranes or skin necrosis: no Has patient had a PCN reaction that required hospitalization: no Has patient had a PCN reaction occurring within the last 10 years: No If all of the above answers are "NO", then may proceed with Cephalosporin use.   . Aspirin     Burning in stomach  . Macrobid [Nitrofurantoin Monohyd Macro] Nausea And Vomiting  . Sulfa Antibiotics Nausea Only  . Tramadol Itching    Seeing things    Current Outpatient Prescriptions on File Prior to Visit  Medication Sig Dispense Refill  . buPROPion (WELLBUTRIN XL) 150 MG 24 hr tablet TAKE 1 TABLET BY MOUTH EVERY DAY IN THE MORNING  1  . clopidogrel (PLAVIX) 75 MG tablet Take 1 tablet (75 mg total) by mouth daily. 90 tablet 3  . dexlansoprazole (DEXILANT) 60 MG capsule Take 1 capsule (60 mg total) by mouth daily. 30 capsule 2  . ezetimibe (ZETIA) 10 MG tablet Take 1 tablet (10 mg total) by mouth daily. 90 tablet 3  . FLUoxetine HCl 60 MG TABS Take 60 mg by mouth daily.     . fluticasone (FLONASE) 50 MCG/ACT nasal spray Place 2 sprays into both nostrils daily as needed for allergies or rhinitis.    Marland Kitchen HYDROmorphone (DILAUDID) 2 MG tablet Take 2 mg by mouth every 4 (four) hours as needed for severe  pain.    . metoprolol (LOPRESSOR) 50 MG tablet TAKE 1/2 TABLETS (25 MG TOTAL) BY MOUTH 2 (TWO) TIMES DAILY. 90 tablet 0  . NITROSTAT 0.4 MG SL tablet PLACE 1 TABLET (0.4 MG TOTAL) UNDER THE TONGUE EVERY 5 (FIVE) MINUTES AS NEEDED FOR CHEST PAIN. 25 tablet 0  . PREMARIN vaginal cream USE 1/2 GRAM VAGINALLY EVERY NIGHT AT BEDTIME FOR THE 1ST 2 WEEKS,, THEN 1/2 GRAM 2-3 TIMES A WEEK 30 g 1  . Probiotic Product (PROBIOTIC PO) Take 1 capsule by mouth daily. Nature Bounty's brand    . simvastatin (ZOCOR) 80 MG tablet TAKE  1 TABLET BY MOUTH AT BEDTIME 30 tablet 5  . UNABLE TO FIND Inject 1 mL into the skin every 30 (thirty) days. b12 shots twice a month at MDs office    . zolpidem (AMBIEN) 5 MG tablet TAKE 1 TABLET BY MOUTH AT BEDTIME 30 tablet 0   No current facility-administered medications on file prior to visit.     BP 132/70 (BP Location: Left Arm, Patient Position: Sitting, Cuff Size: Normal)   Temp 98.1 F (36.7 C) (Oral)   Ht 5\' 1"  (1.549 m)   Wt 175 lb (79.4 kg)   LMP  (LMP Unknown)   BMI 33.07 kg/m       Objective:   Physical Exam  Constitutional: She is oriented to person, place, and time. She appears well-developed and well-nourished. No distress.  Cardiovascular: Normal rate, regular rhythm, normal heart sounds and intact distal pulses.  Exam reveals no gallop and no friction rub.   No murmur heard. Pulmonary/Chest: Effort normal and breath sounds normal. No respiratory distress. She has no wheezes. She has no rales. She exhibits no tenderness.  Abdominal: Soft. Bowel sounds are normal. She exhibits no distension and no mass. There is no tenderness. There is no rebound and no guarding.  Musculoskeletal: Normal range of motion. She exhibits no edema, tenderness or deformity.  Neurological: She is alert and oriented to person, place, and time. She has normal reflexes. She displays normal reflexes. No cranial nerve deficit. She exhibits normal muscle tone. Coordination normal.    Skin: Skin is warm and dry. No rash noted. No erythema. No pallor.  Psychiatric: She has a normal mood and affect. Her behavior is normal. Judgment and thought content normal.  Nursing note and vitals reviewed.     Assessment & Plan:  1. Cognitive impairment - I am going to have her cut back on ambien and stop for a few days to see if this resolved her cognitive impairment.  - Follow up in one month  - Consider further labs and mini mental exam at this time  - Consider referral to neurology   2. Hypothyroidism, unspecified type  - TSH - T3, Free - T4, Free  [name], NP

## 2017-01-15 ENCOUNTER — Ambulatory Visit (INDEPENDENT_AMBULATORY_CARE_PROVIDER_SITE_OTHER): Payer: 59 | Admitting: *Deleted

## 2017-01-15 ENCOUNTER — Ambulatory Visit: Payer: 59

## 2017-01-15 DIAGNOSIS — E538 Deficiency of other specified B group vitamins: Secondary | ICD-10-CM | POA: Diagnosis not present

## 2017-01-15 MED ORDER — CYANOCOBALAMIN 1000 MCG/ML IJ SOLN
1000.0000 ug | Freq: Once | INTRAMUSCULAR | Status: AC
  Administered 2017-01-15: 1000 ug via INTRAMUSCULAR

## 2017-01-23 ENCOUNTER — Encounter: Payer: Self-pay | Admitting: Family Medicine

## 2017-01-23 ENCOUNTER — Ambulatory Visit (INDEPENDENT_AMBULATORY_CARE_PROVIDER_SITE_OTHER): Payer: 59 | Admitting: Family Medicine

## 2017-01-23 VITALS — BP 120/78 | HR 78 | Temp 97.7°F | Ht 61.0 in | Wt 173.2 lb

## 2017-01-23 DIAGNOSIS — B9789 Other viral agents as the cause of diseases classified elsewhere: Secondary | ICD-10-CM | POA: Diagnosis not present

## 2017-01-23 DIAGNOSIS — R350 Frequency of micturition: Secondary | ICD-10-CM | POA: Diagnosis not present

## 2017-01-23 DIAGNOSIS — J329 Chronic sinusitis, unspecified: Secondary | ICD-10-CM | POA: Diagnosis not present

## 2017-01-23 LAB — POC URINALSYSI DIPSTICK (AUTOMATED)
Bilirubin, UA: NEGATIVE
GLUCOSE UA: NEGATIVE
Ketones, UA: NEGATIVE
Leukocytes, UA: NEGATIVE
NITRITE UA: NEGATIVE
Protein, UA: NEGATIVE
RBC UA: NEGATIVE
Spec Grav, UA: 1.015 (ref 1.010–1.025)
UROBILINOGEN UA: 0.2 U/dL
pH, UA: 6 (ref 5.0–8.0)

## 2017-01-23 MED ORDER — PREDNISONE 20 MG PO TABS: ORAL_TABLET | ORAL | 0 refills | Status: DC

## 2017-01-23 NOTE — Patient Instructions (Signed)
Sinsusitis Viral based on <10 days, no double sickening, lack of severity of symptoms in first 3 days. Educated on signs that bacterial infection may have developed (symptoms over 10 days, double sickening).   Treatment: -considered steroid: we opted in- course of prednisone -other symptomatic care with mucinex -Antibiotic indicated: not yet but if not improved at end of prednisone would be reasonable to trial  Finally, we reviewed reasons to return to care including if symptoms worsen or persist or new concerns arise (particularly fever or shortness of breath)  Meds ordered this encounter  . predniSONE (DELTASONE) 20 MG tablet    Sig: Take 1 tablet by mouth daily for 5 days, then 1/2 tablet daily for 2 days    Dispense:  6 tablet    Refill:  0

## 2017-01-23 NOTE — Progress Notes (Signed)
PCP: [name], NP  Subjective:  Bianca [name] is a 71 y.o. year old very pleasant female patient who presents with sinusitis symptoms including nasal congestion, sinus tenderness -other symptoms include: Whitish discharge from nose. Some cough. Mildly winded at times. slightly dizzy with this. Urinary frequency and some burning though Chronic issue- seeing urology next week.  -day of illness:3 days -Symptoms are worsening -previous treatments: OTC cold medicine. Cannot recall the name.  -sick contacts/travel/risks: denies flu exposure.  -Hx of: allergies  ROS-denies fever, SOB, NVD, tooth pain. No wheeze  Pertinent Past Medical History-  Patient Active Problem List   Diagnosis Date Noted  . RAD (reactive airway disease) 04/01/2016  . Dyspnea 02/20/2016  . Bradycardia 01/27/2016  . B12 deficiency 07/11/2015  . Anemia associated with acute blood loss 05/28/2014  . Cigarette smoker 05/28/2014  . OA (osteoarthritis) of hip 05/24/2014  . Chest pain 12/19/2013  . History of non-ST elevation myocardial infarction (NSTEMI) 05/18/2012  . Allergic rhinitis due to pollen 04/01/2012  . Back pain 11/22/2010  . NEOPLASM OF UNCERTAIN BEHAVIOR OF SKIN 06/05/2010  . Swallowing difficulty 06/04/2010  . GERD 07/30/2009  . Hyperlipidemia 03/02/2007  . Depression 03/02/2007  . Coronary artery disease involving native heart without angina pectoris 03/02/2007    Medications- reviewed  Current Outpatient Prescriptions  Medication Sig Dispense Refill  . buPROPion (WELLBUTRIN XL) 150 MG 24 hr tablet TAKE 1 TABLET BY MOUTH EVERY DAY IN THE MORNING  1  . clopidogrel (PLAVIX) 75 MG tablet Take 1 tablet (75 mg total) by mouth daily. 90 tablet 3  . dexlansoprazole (DEXILANT) 60 MG capsule Take 1 capsule (60 mg total) by mouth daily. 30 capsule 2  . escitalopram (LEXAPRO) 20 MG tablet Take 20 mg by mouth daily.  1  . ezetimibe (ZETIA) 10 MG tablet Take 1 tablet (10 mg total) by mouth daily. 90  tablet 3  . fluticasone (FLONASE) 50 MCG/ACT nasal spray Place 2 sprays into both nostrils daily as needed for allergies or rhinitis.    Marland Kitchen HYDROmorphone (DILAUDID) 2 MG tablet Take 2 mg by mouth every 4 (four) hours as needed for severe pain.    . metoprolol (LOPRESSOR) 50 MG tablet TAKE 1/2 TABLETS (25 MG TOTAL) BY MOUTH 2 (TWO) TIMES DAILY. 90 tablet 0  . NITROSTAT 0.4 MG SL tablet PLACE 1 TABLET (0.4 MG TOTAL) UNDER THE TONGUE EVERY 5 (FIVE) MINUTES AS NEEDED FOR CHEST PAIN. 25 tablet 0  . PREMARIN vaginal cream USE 1/2 GRAM VAGINALLY EVERY NIGHT AT BEDTIME FOR THE 1ST 2 WEEKS,, THEN 1/2 GRAM 2-3 TIMES A WEEK 30 g 1  . Probiotic Product (PROBIOTIC PO) Take 1 capsule by mouth daily. Nature Bounty's brand    . simvastatin (ZOCOR) 80 MG tablet TAKE 1 TABLET BY MOUTH AT BEDTIME 30 tablet 5  . UNABLE TO FIND Inject 1 mL into the skin every 30 (thirty) days. b12 shots twice a month at MDs office    . zolpidem (AMBIEN) 5 MG tablet TAKE 1 TABLET BY MOUTH AT BEDTIME 30 tablet 0  . predniSONE (DELTASONE) 20 MG tablet Take 1 tablet by mouth daily for 5 days, then 1/2 tablet daily for 2 days 6 tablet 0   No current facility-administered medications for this visit.     Objective: BP 120/78 (BP Location: Right Arm, Patient Position: Sitting, Cuff Size: Normal)   Pulse 78   Temp 97.7 F (36.5 C) (Oral)   Ht 5\' 1"  (1.549 m)   Wt  173 lb 3.2 oz (78.6 kg)   LMP  (LMP Unknown)   BMI 32.73 kg/m  Gen: NAD, resting comfortably HEENT: Turbinates erythematous with white drainage, TM normal, pharynx mildly erythematous with no tonsilar exudate or edema, maxillary sinus tenderness CV: RRR no murmurs rubs or gallops Lungs: CTAB no crackles, wheeze, rhonchi Abdomen: no suprapubic pain Ext: no edema Skin: warm, dry, no rash Neuro: grossly normal, moves all extremities  Assessment/Plan:  Sinsusitis Viral based on <10 days, no double sickening, lack of severity of symptoms in first 3 days. Educated on signs  that bacterial infection may have developed (symptoms over 10 days, double sickening).   Treatment: -considered steroid: we opted in- course of prednisone -other symptomatic care with mucinex -Antibiotic indicated: not yet but if not improved at end of prednisone would be reasonable to trial  Finally, we reviewed reasons to return to care including if symptoms worsen or persist or new concerns arise (particularly fever or shortness of breath)  Meds ordered this encounter  . predniSONE (DELTASONE) 20 MG tablet    Sig: Take 1 tablet by mouth daily for 5 days, then 1/2 tablet daily for 2 days    Dispense:  6 tablet    Refill:  0   Urine frequency - Plan: POCT Urinalysis Dipstick (Automated) S: chronic issue as noted above Results for orders placed or performed in visit on 01/23/17 (from the past 24 hour(s))  POCT Urinalysis Dipstick (Automated)     Status: None   Collection Time: 01/23/17  5:03 PM  Result Value Ref Range   Color, UA yellow    Clarity, UA clear    Glucose, UA N    Bilirubin, UA N    Ketones, UA N    Spec Grav, UA 1.015 1.010 - 1.025   Blood, UA N    pH, UA 6.0 5.0 - 8.0   Protein, UA N    Urobilinogen, UA 0.2 0.2 or 1.0 E.U./dL   Nitrite, UA N    Leukocytes, UA Negative Negative  A/P:normal UA- advised no clear UTI- would proceed with urology evaluation. She declines culture.     Garret Reddish, MD

## 2017-01-26 ENCOUNTER — Encounter: Payer: Self-pay | Admitting: Gastroenterology

## 2017-01-26 ENCOUNTER — Ambulatory Visit (AMBULATORY_SURGERY_CENTER): Payer: 59 | Admitting: Gastroenterology

## 2017-01-26 VITALS — BP 119/45 | HR 50 | Temp 98.2°F | Resp 10 | Ht 61.0 in | Wt 170.0 lb

## 2017-01-26 DIAGNOSIS — R1013 Epigastric pain: Secondary | ICD-10-CM

## 2017-01-26 DIAGNOSIS — R1310 Dysphagia, unspecified: Secondary | ICD-10-CM | POA: Diagnosis not present

## 2017-01-26 MED ORDER — SODIUM CHLORIDE 0.9 % IV SOLN: 500.0000 mL | INTRAVENOUS | Status: DC

## 2017-01-26 NOTE — Op Note (Signed)
[place] Patient Name: Bianca [name] Procedure Date: 01/26/2017 2:03 PM MRN: [id number] Endoscopist: Milus Banister , MD Age: 71 Referring MD:  Date of Birth: 11-25-1945 Gender: Female Account #: [id number] Procedure:                Upper GI endoscopy Indications:              Epigastric abdominal pain, Dysphagia Medicines:                Monitored Anesthesia Care Procedure:                Pre-Anesthesia Assessment:                           - Prior to the procedure, a History and Physical                            was performed, and patient medications and                            allergies were reviewed. The patient's tolerance of                            previous anesthesia was also reviewed. The risks                            and benefits of the procedure and the sedation                            options and risks were discussed with the patient.                            All questions were answered, and informed consent                            was obtained. Prior Anticoagulants: The patient has                            taken Plavix (clopidogrel), last dose was 2 days                            prior to procedure. ASA Grade Assessment: III - A                            patient with severe systemic disease. After                            reviewing the risks and benefits, the patient was                            deemed in satisfactory condition to undergo the                            procedure.  After obtaining informed consent, the endoscope was                            passed under direct vision. Throughout the                            procedure, the patient's blood pressure, pulse, and                            oxygen saturations were monitored continuously. The                            Endoscope was introduced through the mouth, and                            advanced to the second part of duodenum. The upper                          GI endoscopy was accomplished without difficulty.                            The patient tolerated the procedure well. Scope In: Scope Out: Findings:                 The esophagus was normal.                           The stomach was normal.                           The examined duodenum was normal. Complications:            No immediate complications. Estimated blood loss:                            None. Estimated Blood Loss:     Estimated blood loss: none. Impression:               - Normal UGI tract. Recommendation:           - Patient has a contact number available for                            emergencies. The signs and symptoms of potential                            delayed complications were discussed with the                            patient. Return to normal activities tomorrow.                            Written discharge instructions were provided to the                            patient.                           -  Resume previous diet.                           - Continue present medications. OK to decrease to                            once daily dexilant.                           - Dr. Ardis Hughs' office will arrange abdominal US for                            the epigastric pain. Milus Banister, MD 01/26/2017 2:16:53 PM This report has been signed electronically.

## 2017-01-26 NOTE — Progress Notes (Signed)
Report given to PACU, vss 

## 2017-01-26 NOTE — Patient Instructions (Signed)
YOU HAD AN ENDOSCOPIC PROCEDURE TODAY AT [facility]:   Refer to the procedure report that was given to you for any specific questions about what was found during the examination.  If the procedure report does not answer your questions, please call your gastroenterologist to clarify.  If you requested that your care partner not be given the details of your procedure findings, then the procedure report has been included in a sealed envelope for you to review at your convenience later.  YOU SHOULD EXPECT: Some feelings of bloating in the abdomen. Passage of more gas than usual.  Walking can help get rid of the air that was put into your GI tract during the procedure and reduce the bloating. If you had a lower endoscopy (such as a colonoscopy or flexible sigmoidoscopy) you may notice spotting of blood in your stool or on the toilet paper. If you underwent a bowel prep for your procedure, you may not have a normal bowel movement for a few days.  Please Note:  You might notice some irritation and congestion in your nose or some drainage.  This is from the oxygen used during your procedure.  There is no need for concern and it should clear up in a day or so.  SYMPTOMS TO REPORT IMMEDIATELY:    Following upper endoscopy (EGD)  Vomiting of blood or coffee ground material  New chest pain or pain under the shoulder blades  Painful or persistently difficult swallowing  New shortness of breath  Fever of 100F or higher  Black, tarry-looking stools  For urgent or emergent issues, a gastroenterologist can be reached at any hour by calling [phone].  May decrease to once daily dexilant. May resume Plavix today.  DIET:  We do recommend a small meal at first, but then you may proceed to your regular diet.  Drink plenty of fluids but you should avoid alcoholic beverages for 24 hours.  ACTIVITY:  You should plan to take it easy for the rest of today and you should NOT DRIVE or use heavy  machinery until tomorrow (because of the sedation medicines used during the test).    FOLLOW UP: Our staff will call the number listed on your records the next business day following your procedure to check on you and address any questions or concerns that you may have regarding the information given to you following your procedure. If we do not reach you, we will leave a message.  However, if you are feeling well and you are not experiencing any problems, there is no need to return our call.  We will assume that you have returned to your regular daily activities without incident.  If any biopsies were taken you will be contacted by phone or by letter within the next 1-3 weeks.  Please call us at [phone] if you have not heard about the biopsies in 3 weeks.    SIGNATURES/CONFIDENTIALITY: You and/or your care partner have signed paperwork which will be entered into your electronic medical record.  These signatures attest to the fact that that the information above on your After Visit Summary has been reviewed and is understood.  Full responsibility of the confidentiality of this discharge information lies with you and/or your care-partner.  Thank you for letting us take care of your healthcare needs today.

## 2017-01-27 ENCOUNTER — Telehealth: Payer: Self-pay | Admitting: *Deleted

## 2017-01-27 NOTE — Telephone Encounter (Signed)
  Follow up Call-  Call back number 01/26/2017  Post procedure Call Back phone  # [phone]  Permission to leave phone message Yes  Some recent data might be hidden     Patient questions:  Do you have a fever, pain , or abdominal swelling? No. Pain Score  0 *  Have you tolerated food without any problems? Yes.    Have you been able to return to your normal activities? Yes.    Do you have any questions about your discharge instructions: Diet   No. Medications  No. Follow up visit  No.  Do you have questions or concerns about your Care? No.  Actions: * If pain score is 4 or above: No action needed, pain <4.

## 2017-01-30 ENCOUNTER — Ambulatory Visit (INDEPENDENT_AMBULATORY_CARE_PROVIDER_SITE_OTHER): Payer: 59 | Admitting: Adult Health

## 2017-01-30 ENCOUNTER — Encounter: Payer: Self-pay | Admitting: Adult Health

## 2017-01-30 VITALS — BP 126/68 | Temp 97.9°F | Ht 61.0 in | Wt 173.8 lb

## 2017-01-30 DIAGNOSIS — J0141 Acute recurrent pansinusitis: Secondary | ICD-10-CM | POA: Diagnosis not present

## 2017-01-30 MED ORDER — DOXYCYCLINE HYCLATE 100 MG PO CAPS: 100.0000 mg | ORAL_CAPSULE | Freq: Two times a day (BID) | ORAL | 0 refills | Status: DC

## 2017-01-30 NOTE — Progress Notes (Signed)
Subjective:    Patient ID: Bianca [name], female    DOB: Nov 07, 1945, 71 y.o.   MRN: [id number]  Was seen by Dr. Yong Channel 4 days ago and prescribed steroids for viral sinusitis. She reports that she did not respond well to the steroids as they made her feel jittery and she could not sleep   Sinusitis  This is a recurrent problem. The current episode started 1 to 4 weeks ago (8-9 days). The problem has been gradually worsening since onset. There has been no fever. Associated symptoms include congestion, headaches and sinus pressure. Pertinent negatives include no coughing, ear pain, shortness of breath, sore throat or swollen glands. Treatments tried: steroids. The treatment provided no relief.    Review of Systems  Constitutional: Positive for fatigue.  HENT: Positive for congestion, sinus pain and sinus pressure. Negative for ear pain, postnasal drip, rhinorrhea and sore throat.   Respiratory: Negative for cough and shortness of breath.   Cardiovascular: Negative.   Neurological: Positive for headaches.   Past Medical History:  Diagnosis Date  . Arthritis   . Blood transfusion without reported diagnosis   . CAD (coronary artery disease)    a. 1997 MI/PCI RCA;  b. 2007 MI/PCI of 100% RCA with Taxus DES x 3 placed, EF 55%;  c. 2010 Cath: stable anatomy;  d. 05/2012 NSTEMI/Cath/PCI: LM nl, LAD 60-20m, D1 20ost, LCX 27m, RCA 40-23m ISR, 60/95d (Treated w/ 2.75x33 Xience Xpedition DES), PDA 31m (Treated w/ PTCA), EF 60%.  . Carpal tunnel syndrome on both sides   . Chronic lower back pain   . Depression   . GERD (gastroesophageal reflux disease)   . Hyperlipidemia   . Myocardial infarction [facility]) 1997, 2007, 2013  . Numbness of foot    Left foot - back surgery 2009    Social History   Social History  . Marital status: Married    Spouse name: N/A  . Number of children: N/A  . Years of education: N/A   Occupational History  . Not on file.   Social History Main Topics  . Smoking  status: Light Tobacco Smoker    Packs/day: 0.50    Years: 50.00    Types: Cigarettes  . Smokeless tobacco: Never Used     Comment: smokes a half pack a day  . Alcohol use No  . Drug use: No  . Sexual activity: Not Currently    Birth control/ protection: Post-menopausal   Other Topics Concern  . Not on file   Social History Narrative   Is a homemaker   Married for 32 years    Has a daughter and son    Pets: Two dogs and a Neurosurgeon   Likes to garden ( flower beds).           Past Surgical History:  Procedure Laterality Date  . ANKLE SURGERY  Years ago   Left; "had to take out a floater"  . CARPAL TUNNEL RELEASE Left 10/12016  . CESAREAN SECTION  1990  . CORONARY ANGIOPLASTY WITH STENT PLACEMENT  1997; 2007, 2013   "2 + 2; + 1= total of 5  . LEFT HEART CATHETERIZATION WITH CORONARY ANGIOGRAM N/A 05/18/2012   Procedure: LEFT HEART CATHETERIZATION WITH CORONARY ANGIOGRAM;  Surgeon: Wellington Hampshire, MD;  Location: [facility];  Service: Cardiovascular;  Laterality: N/A;  . [place]; 2009  . TOTAL HIP ARTHROPLASTY Right 05/24/2014   Procedure: RIGHT TOTAL HIP ARTHROPLASTY ANTERIOR APPROACH;  Surgeon: Gearlean Alf, MD;  Location: [facility];  Service: Orthopedics;  Laterality: Right;    Family History  Problem Relation Age of Onset  . Cancer Paternal Grandfather        Esophageal  . Coronary artery disease Mother   . Diabetes Mother   . Hypertension Mother   . Dementia Father   . Sudden death Brother        Suicide    Allergies  Allergen Reactions  . Oxycodone Hcl Itching and Other (See Comments)    confusion  . Penicillins Itching and Rash    Has patient had a PCN reaction causing immediate rash, facial/tongue/throat swelling, SOB or lightheadedness with hypotension: yes rash that took a while to go away across the abdomen Has patient had a PCN reaction causing severe rash involving mucus membranes or skin necrosis: no Has patient had a PCN  reaction that required hospitalization: no Has patient had a PCN reaction occurring within the last 10 years: No If all of the above answers are "NO", then may proceed with Cephalosporin use.   . Aspirin     Burning in stomach  . Macrobid [Nitrofurantoin Monohyd Macro] Nausea And Vomiting  . Sulfa Antibiotics Nausea Only  . Tramadol Itching    Seeing things    Current Outpatient Prescriptions on File Prior to Visit  Medication Sig Dispense Refill  . buPROPion (WELLBUTRIN XL) 150 MG 24 hr tablet TAKE 1 TABLET BY MOUTH EVERY DAY IN THE MORNING  1  . clopidogrel (PLAVIX) 75 MG tablet Take 1 tablet (75 mg total) by mouth daily. 90 tablet 3  . dexlansoprazole (DEXILANT) 60 MG capsule Take 1 capsule (60 mg total) by mouth daily. 30 capsule 2  . escitalopram (LEXAPRO) 20 MG tablet Take 20 mg by mouth daily.  1  . ezetimibe (ZETIA) 10 MG tablet Take 1 tablet (10 mg total) by mouth daily. 90 tablet 3  . fluticasone (FLONASE) 50 MCG/ACT nasal spray Place 2 sprays into both nostrils daily as needed for allergies or rhinitis.    Marland Kitchen HYDROmorphone (DILAUDID) 2 MG tablet Take 2 mg by mouth every 4 (four) hours as needed for severe pain.    . metoprolol (LOPRESSOR) 50 MG tablet TAKE 1/2 TABLETS (25 MG TOTAL) BY MOUTH 2 (TWO) TIMES DAILY. 90 tablet 0  . NITROSTAT 0.4 MG SL tablet PLACE 1 TABLET (0.4 MG TOTAL) UNDER THE TONGUE EVERY 5 (FIVE) MINUTES AS NEEDED FOR CHEST PAIN. 25 tablet 0  . PREMARIN vaginal cream USE 1/2 GRAM VAGINALLY EVERY NIGHT AT BEDTIME FOR THE 1ST 2 WEEKS,, THEN 1/2 GRAM 2-3 TIMES A WEEK 30 g 1  . Probiotic Product (PROBIOTIC PO) Take 1 capsule by mouth daily. Nature Bounty's brand    . simvastatin (ZOCOR) 80 MG tablet TAKE 1 TABLET BY MOUTH AT BEDTIME 30 tablet 5  . UNABLE TO FIND Inject 1 mL into the skin every 30 (thirty) days. b12 shots twice a month at MDs office    . zolpidem (AMBIEN) 5 MG tablet TAKE 1 TABLET BY MOUTH AT BEDTIME 30 tablet 0   Current Facility-Administered  Medications on File Prior to Visit  Medication Dose Route Frequency Provider Last Rate Last Dose  . 0.9 %  sodium chloride infusion  500 mL Intravenous Continuous Milus Banister, MD        BP 126/68 (BP Location: Left Arm, Patient Position: Sitting, Cuff Size: Normal)   Temp 97.9 F (36.6 C) (Oral)   Ht 5\' 1"  (  1.549 m)   Wt 173 lb 12.8 oz (78.8 kg)   LMP  (LMP Unknown)   BMI 32.84 kg/m       Objective:   Physical Exam  Constitutional: She is oriented to person, place, and time. She appears well-developed and well-nourished. No distress.  HENT:  Head: Normocephalic and atraumatic.  Right Ear: Hearing, tympanic membrane, external ear and ear canal normal.  Left Ear: Hearing, tympanic membrane, external ear and ear canal normal.  Nose: Mucosal edema present. No rhinorrhea. Right sinus exhibits maxillary sinus tenderness and frontal sinus tenderness. Left sinus exhibits maxillary sinus tenderness and frontal sinus tenderness.  Mouth/Throat: Uvula is midline and oropharynx is clear and moist. No oropharyngeal exudate.  Eyes: Conjunctivae and EOM are normal. Pupils are equal, round, and reactive to light. Right eye exhibits no discharge.  Neck: Normal range of motion. Neck supple.  Cardiovascular: Normal rate, regular rhythm, normal heart sounds and intact distal pulses.  Exam reveals no gallop and no friction rub.   No murmur heard. Pulmonary/Chest: Effort normal and breath sounds normal. No respiratory distress. She has no wheezes. She has no rales. She exhibits no tenderness.  Lymphadenopathy:    She has no cervical adenopathy.  Neurological: She is alert and oriented to person, place, and time.  Skin: Skin is warm and dry. No rash noted. She is not diaphoretic. No erythema. No pallor.  Psychiatric: She has a normal mood and affect. Her behavior is normal. Judgment and thought content normal.  Nursing note and vitals reviewed.     Assessment & Plan:  1. Acute recurrent  pansinusitis - Will treat do to worsening symptoms  - doxycycline (VIBRAMYCIN) 100 MG capsule; Take 1 capsule (100 mg total) by mouth 2 (two) times daily.  Dispense: 14 capsule; Refill: 0 - Follow up if not resolved at end of antibiotic treatment or sooner if fever develops   [name], NP

## 2017-02-03 ENCOUNTER — Other Ambulatory Visit: Payer: Self-pay

## 2017-02-03 DIAGNOSIS — R1013 Epigastric pain: Secondary | ICD-10-CM

## 2017-02-03 NOTE — Progress Notes (Signed)
The pt has been advised and will call with any questions   You have been scheduled for an abdominal ultrasound at [facility] (1st floor of hospital) on 02/11/17 at 9 am. Please arrive 15 minutes prior to your appointment for registration. Make certain not to have anything to eat or drink 6 hours prior to your appointment. Should you need to reschedule your appointment, please contact radiology at [phone]. This test typically takes about 30 minutes to perform.

## 2017-02-04 ENCOUNTER — Other Ambulatory Visit: Payer: Self-pay | Admitting: Adult Health

## 2017-02-04 DIAGNOSIS — G4700 Insomnia, unspecified: Secondary | ICD-10-CM

## 2017-02-04 NOTE — Telephone Encounter (Signed)
Ok to refill 

## 2017-02-04 NOTE — Telephone Encounter (Signed)
Rx has been called in as directed. Thanks! 

## 2017-02-11 ENCOUNTER — Ambulatory Visit (HOSPITAL_COMMUNITY): Payer: 59

## 2017-02-25 ENCOUNTER — Telehealth: Payer: Self-pay | Admitting: Obstetrics and Gynecology

## 2017-02-25 NOTE — Telephone Encounter (Signed)
Call forwarded from front office staff. Patient request OV with provider, declines scheduling with Dr. Quincy Simmonds. Patient reports:  1 .constant right sided pain 6/10, "vaginal area at ovaries"  2. severe vaginal burning last 2 years, narcotic pain medication only relief provided, was taking for back pain, was advised nothing could be done for vaginal burning  3. denies any other GYN or Urinary complaints, nausea, vomiting, fever, chills  4. reports bowel movements as "normal" q1-d3 days  5.has been evaluated by pcp recently and GI, reports did not have recommended US abdomen done d/t deciding to go through withdraw program  Patient scheduled with Dr. Talbert Nan on 7/13 at 9:15am. Patient declined earlier appointments. Advised if pain becomes severe or fever/chills/vomiting develops, seek care at local ER/Urgent care for evaluations. Advised patient would review with provider and return call with an additional recommendations. Patient is agreeable.      Cc: Dr. Quincy Simmonds

## 2017-02-25 NOTE — Telephone Encounter (Signed)
Dr. Talbert Nan,  I scheduled patient for OV. Per review of EPIC, patient referred to Dr. Ellard Artis at [facility] by Dr. Quincy Simmonds, scheduled for 02/03/17. Called Urology, spoke with Roselyn Reef, was advised appointment cancelled by patient and rescheduled to 04/09/17 at 3pm.   Routing FYI.Marland Kitchen   Cc: Dr. Quincy Simmonds

## 2017-02-25 NOTE — Telephone Encounter (Signed)
Patient called and requested an appointment for right side pain in the area of her ovaries.  Last seen: 12/11/16

## 2017-02-27 ENCOUNTER — Ambulatory Visit: Payer: Self-pay | Admitting: Obstetrics and Gynecology

## 2017-02-27 NOTE — Telephone Encounter (Signed)
Routing to Dr. Rosann Auerbach. Will close encounter.  Cc: Dr. Quincy Simmonds, Lamont Snowball

## 2017-02-27 NOTE — Telephone Encounter (Signed)
Patient dnka her appointment today for right sided pain. I left a message for patient to call and reschedule,

## 2017-03-03 ENCOUNTER — Ambulatory Visit (HOSPITAL_COMMUNITY): Payer: 59

## 2017-03-05 ENCOUNTER — Ambulatory Visit (HOSPITAL_COMMUNITY): Payer: 59

## 2017-03-06 ENCOUNTER — Ambulatory Visit (HOSPITAL_COMMUNITY)
Admission: RE | Admit: 2017-03-06 | Discharge: 2017-03-06 | Disposition: A | Discharge status: Home / Self Care | Payer: 59 | Source: Ambulatory Visit | Attending: Gastroenterology | Admitting: Gastroenterology

## 2017-03-06 ENCOUNTER — Other Ambulatory Visit: Payer: Self-pay | Admitting: Adult Health

## 2017-03-06 DIAGNOSIS — R1013 Epigastric pain: Secondary | ICD-10-CM | POA: Diagnosis present

## 2017-03-06 DIAGNOSIS — G4700 Insomnia, unspecified: Secondary | ICD-10-CM

## 2017-03-06 NOTE — Telephone Encounter (Signed)
Ok to refill for one month  

## 2017-03-06 NOTE — Telephone Encounter (Signed)
Called in #30 to the pharmacy and left on machine.

## 2017-03-06 NOTE — Telephone Encounter (Signed)
Last filled on 02/04/17 for #30. Seen Acutely on 01/30/17 Last follow up on 01/13/17 Please advise.  Thanks!!!

## 2017-03-06 NOTE — Telephone Encounter (Signed)
Pt is going out of town and would like to see if she could get this medication today.

## 2017-03-17 ENCOUNTER — Ambulatory Visit (INDEPENDENT_AMBULATORY_CARE_PROVIDER_SITE_OTHER): Payer: 59 | Admitting: Adult Health

## 2017-03-17 ENCOUNTER — Encounter: Payer: Self-pay | Admitting: Adult Health

## 2017-03-17 VITALS — BP 138/80 | Temp 97.5°F | Wt 170.0 lb

## 2017-03-17 DIAGNOSIS — N3090 Cystitis, unspecified without hematuria: Secondary | ICD-10-CM

## 2017-03-17 LAB — POC URINALSYSI DIPSTICK (AUTOMATED)
BILIRUBIN UA: NEGATIVE
Blood, UA: 1
GLUCOSE UA: NEGATIVE
KETONES UA: NEGATIVE
Nitrite, UA: POSITIVE
PROTEIN UA: POSITIVE
SPEC GRAV UA: 1.025 (ref 1.010–1.025)
Urobilinogen, UA: 0.2 E.U./dL
pH, UA: 6 (ref 5.0–8.0)

## 2017-03-17 MED ORDER — CIPROFLOXACIN HCL 500 MG PO TABS: 500.0000 mg | ORAL_TABLET | Freq: Two times a day (BID) | ORAL | 0 refills | Status: DC

## 2017-03-17 NOTE — Progress Notes (Signed)
Subjective:    Patient ID: Bianca [name], female    DOB: 06/06/1946, 70 y.o.   MRN: [id number]  HPI  71 year old female who  has a past medical history of Arthritis; Blood transfusion without reported diagnosis; CAD (coronary artery disease); Carpal tunnel syndrome on both sides; Chronic lower back pain; Depression; GERD (gastroesophageal reflux disease); Hyperlipidemia; Myocardial infarction [facility]) (1997, 2007, 2013); and Numbness of foot. she presents to the office today for UTI like symptoms. She reports that her symptoms today include that of dysuria, abdominal pain, urgency and frequency. She denies any hematuria   On a separate note she reports today that she has been without dilaudid for a month. She decided to " go off it cold [place]". She felt as though she had been taking it too long and it was starting to affect her cognition. She did go through withdrawal symptoms.   Review of Systems See HPI   Past Medical History:  Diagnosis Date  . Arthritis   . Blood transfusion without reported diagnosis   . CAD (coronary artery disease)    a. 1997 MI/PCI RCA;  b. 2007 MI/PCI of 100% RCA with Taxus DES x 3 placed, EF 55%;  c. 2010 Cath: stable anatomy;  d. 05/2012 NSTEMI/Cath/PCI: LM nl, LAD 60-56m, D1 20ost, LCX 39m, RCA 40-14m ISR, 60/95d (Treated w/ 2.75x33 Xience Xpedition DES), PDA 37m (Treated w/ PTCA), EF 60%.  . Carpal tunnel syndrome on both sides   . Chronic lower back pain   . Depression   . GERD (gastroesophageal reflux disease)   . Hyperlipidemia   . Myocardial infarction [facility]) 1997, 2007, 2013  . Numbness of foot    Left foot - back surgery 2009    Social History   Social History  . Marital status: Married    Spouse name: N/A  . Number of children: N/A  . Years of education: N/A   Occupational History  . Not on file.   Social History Main Topics  . Smoking status: Light Tobacco Smoker    Packs/day: 0.50    Years: 50.00    Types: Cigarettes  . Smokeless  tobacco: Never Used     Comment: smokes a half pack a day  . Alcohol use No  . Drug use: No  . Sexual activity: Not Currently    Birth control/ protection: Post-menopausal   Other Topics Concern  . Not on file   Social History Narrative   Is a homemaker   Married for 31 years    Has a daughter and son    Pets: Two dogs and a Neurosurgeon   Likes to garden ( flower beds).           Past Surgical History:  Procedure Laterality Date  . ANKLE SURGERY  Years ago   Left; "had to take out a floater"  . CARPAL TUNNEL RELEASE Left 10/12016  . CESAREAN SECTION  1990  . CORONARY ANGIOPLASTY WITH STENT PLACEMENT  1997; 2007, 2013   "2 + 2; + 1= total of 5  . LEFT HEART CATHETERIZATION WITH CORONARY ANGIOGRAM N/A 05/18/2012   Procedure: LEFT HEART CATHETERIZATION WITH CORONARY ANGIOGRAM;  Surgeon: Wellington Hampshire, MD;  Location: [facility];  Service: Cardiovascular;  Laterality: N/A;  . [place]; 2009  . TOTAL HIP ARTHROPLASTY Right 05/24/2014   Procedure: RIGHT TOTAL HIP ARTHROPLASTY ANTERIOR APPROACH;  Surgeon: Gearlean Alf, MD;  Location: [facility];  Service: Orthopedics;  Laterality: Right;    Family History  Problem Relation Age of Onset  . Cancer Paternal Grandfather        Esophageal  . Coronary artery disease Mother   . Diabetes Mother   . Hypertension Mother   . Dementia Father   . Sudden death Brother        Suicide    Allergies  Allergen Reactions  . Oxycodone Hcl Itching and Other (See Comments)    confusion  . Penicillins Itching and Rash    Has patient had a PCN reaction causing immediate rash, facial/tongue/throat swelling, SOB or lightheadedness with hypotension: yes rash that took a while to go away across the abdomen Has patient had a PCN reaction causing severe rash involving mucus membranes or skin necrosis: no Has patient had a PCN reaction that required hospitalization: no Has patient had a PCN reaction occurring within the last 10  years: No If all of the above answers are "NO", then may proceed with Cephalosporin use.   . Aspirin     Burning in stomach  . Macrobid [Nitrofurantoin Monohyd Macro] Nausea And Vomiting  . Sulfa Antibiotics Nausea Only  . Tramadol Itching    Seeing things    Current Outpatient Prescriptions on File Prior to Visit  Medication Sig Dispense Refill  . buPROPion (WELLBUTRIN XL) 150 MG 24 hr tablet TAKE 1 TABLET BY MOUTH EVERY DAY IN THE MORNING  1  . clopidogrel (PLAVIX) 75 MG tablet Take 1 tablet (75 mg total) by mouth daily. 90 tablet 3  . dexlansoprazole (DEXILANT) 60 MG capsule Take 1 capsule (60 mg total) by mouth daily. 30 capsule 2  . escitalopram (LEXAPRO) 20 MG tablet Take 20 mg by mouth daily.  1  . ezetimibe (ZETIA) 10 MG tablet Take 1 tablet (10 mg total) by mouth daily. 90 tablet 3  . fluticasone (FLONASE) 50 MCG/ACT nasal spray Place 2 sprays into both nostrils daily as needed for allergies or rhinitis.    . metoprolol (LOPRESSOR) 50 MG tablet TAKE 1/2 TABLETS (25 MG TOTAL) BY MOUTH 2 (TWO) TIMES DAILY. 90 tablet 0  . NITROSTAT 0.4 MG SL tablet PLACE 1 TABLET (0.4 MG TOTAL) UNDER THE TONGUE EVERY 5 (FIVE) MINUTES AS NEEDED FOR CHEST PAIN. 25 tablet 0  . PREMARIN vaginal cream USE 1/2 GRAM VAGINALLY EVERY NIGHT AT BEDTIME FOR THE 1ST 2 WEEKS,, THEN 1/2 GRAM 2-3 TIMES A WEEK 30 g 1  . Probiotic Product (PROBIOTIC PO) Take 1 capsule by mouth daily. Nature [employer]ounty's brand    . simvastatin (ZOCOR) 80 MG tablet TAKE 1 TABLET BY MOUTH AT BEDTIME 30 tablet 5  . UNABLE TO FIND Inject 1 mL into the skin every 30 (thirty) days. b12 shots twice a month at MDs office    . zolpidem (AMBIEN) 5 MG tablet TAKE 1 TABLET BY MOUTH AT BEDTIME 30 tablet 0   No current facility-administered medications on file prior to visit.     BP 138/80   Temp (!) 97.5 F (36.4 C) (Oral)   Wt 170 lb (77.1 kg)   LMP  (LMP Unknown)   BMI 32.12 kg/m       Objective:   Physical Exam  Constitutional: She is  oriented to person, place, and time. She appears well-developed and well-nourished.  Cardiovascular: Normal rate, regular rhythm, normal heart sounds and intact distal pulses.  Exam reveals no gallop and no friction rub.   No murmur heard. Pulmonary/Chest: Effort normal and breath sounds normal. No  respiratory distress. She has no wheezes. She has no rales. She exhibits no tenderness.  Abdominal: Normal appearance and bowel sounds are normal. There is no hepatosplenomegaly, splenomegaly or hepatomegaly. There is tenderness in the suprapubic area. There is no CVA tenderness.  Neurological: She is alert and oriented to person, place, and time.  Skin: Skin is warm and dry. No rash noted. She is not diaphoretic. No erythema. No pallor.  Psychiatric: She has a normal mood and affect. Her behavior is normal. Judgment and thought content normal.  Nursing note and vitals reviewed.      Assessment & Plan:  1. Cystitis - POCT Urinalysis Dipstick (Automated)- Po s leuks, blood and nitrates.  - Culture, Urine - Will treat with cipro 500 mg BID x 3 days  - She is allergic to sulfa and PCN - Drink plenty of water and follow up if no improvement in the next 2-3 days   [name], NP

## 2017-03-19 LAB — URINE CULTURE

## 2017-03-31 ENCOUNTER — Ambulatory Visit (INDEPENDENT_AMBULATORY_CARE_PROVIDER_SITE_OTHER): Payer: 59 | Admitting: Adult Health

## 2017-03-31 ENCOUNTER — Encounter: Payer: Self-pay | Admitting: Adult Health

## 2017-03-31 VITALS — BP 134/80 | Temp 97.7°F | Ht 61.0 in | Wt 170.0 lb

## 2017-03-31 DIAGNOSIS — R42 Dizziness and giddiness: Secondary | ICD-10-CM | POA: Diagnosis not present

## 2017-03-31 DIAGNOSIS — R55 Syncope and collapse: Secondary | ICD-10-CM | POA: Diagnosis not present

## 2017-03-31 MED ORDER — MECLIZINE HCL 25 MG PO TABS: 25.0000 mg | ORAL_TABLET | Freq: Two times a day (BID) | ORAL | 0 refills | Status: DC

## 2017-03-31 NOTE — Progress Notes (Addendum)
Subjective:    Patient ID: Bianca [name], female    DOB: [birth year], 71 y.o.   MRN: [id number]  HPI  71 year old female who  has a past medical history of Arthritis; Blood transfusion without reported diagnosis; CAD (coronary artery disease); Carpal tunnel syndrome on both sides; Chronic lower back pain; Depression; GERD (gastroesophageal reflux disease); Hyperlipidemia; Myocardial infarction [facility]) (1997, 2007, 2013); and Numbness of foot.   She reports today after having a syncopal episode at home. She reports that three days ago she could not sleep( and had not been able to sleep for two days prior) , so she got up and went downstairs where she had a syncopal episode in her dining room. She reports that this was at 4 am. She remembers " going down" and then the next thing she remembers is being woken up by her husband at 7 am. Since that time she has been dizzy constantly. She does not feel as though she hit her head, as it was not sore. She does report pain to left shoulder and lower back. She is anticoagulated on Plavix. Dizziness is constant, she does not feel as though the world around her is spinning.   Review of Systems See HPI   Past Medical History:  Diagnosis Date  . Arthritis   . Blood transfusion without reported diagnosis   . CAD (coronary artery disease)    a. 1997 MI/PCI RCA;  b. 2007 MI/PCI of 100% RCA with Taxus DES x 3 placed, EF 55%;  c. 2010 Cath: stable anatomy;  d. 05/2012 NSTEMI/Cath/PCI: LM nl, LAD 60-86m D1 20ost, LCX 538mRCA 40-5044mR, 60/95d (Treated w/ 2.75x33 Xience Xpedition DES), PDA 55m16meated w/ PTCA), EF 60%.  . Carpal tunnel syndrome on both sides   . Chronic lower back pain   . Depression   . GERD (gastroesophageal reflux disease)   . Hyperlipidemia   . [facility]  . Numbness of foot    Left foot - back surgery 2009    Social History   Social History  . Marital status: Married    Spouse name: N/A  . Number  of children: N/A  . Years of education: N/A   Occupational History  . Not on file.   Social History Main Topics  . Smoking status: Light Tobacco Smoker    Packs/day: 0.50    Years: 50.00    Types: Cigarettes  . Smokeless tobacco: Never Used     Comment: smokes a half pack a day  . Alcohol use No  . Drug use: No  . Sexual activity: Not Currently    Birth control/ protection: Post-menopausal   Other Topics Concern  . Not on file   Social History Narrative   Is a homemaker   Married for 49 y59rs    Has a daughter and son    Pets: Two dogs and a cat Neurosurgeonikes to garden ( flower beds).           Past Surgical History:  Procedure Laterality Date  . ANKLE SURGERY  Years ago   Left; "had to take out a floater"  . CARPAL TUNNEL RELEASE Left 10/12016  . CESAREAN SECTION  1990  . CORONARY ANGIOPLASTY WITH STENT PLACEMENT  1997; 2007, 2013   "2 + 2; + 1= total of 5  . LEFT HEART CATHETERIZATION WITH CORONARY ANGIOGRAM N/A 05/18/2012   Procedure: LEFT HEART CATHETERIZATION WITH CORONARY ANGIOGRAM;  Surgeon: MuhaRogue Jury  Ferne Reus, MD;  Location: [facility];  Service: Cardiovascular;  Laterality: N/A;  . [place]; 2009  . TOTAL HIP ARTHROPLASTY Right 05/24/2014   Procedure: RIGHT TOTAL HIP ARTHROPLASTY ANTERIOR APPROACH;  Surgeon: Gearlean Alf, MD;  Location: [facility];  Service: Orthopedics;  Laterality: Right;    Family History  Problem Relation Age of Onset  . Cancer Paternal Grandfather        Esophageal  . Coronary artery disease Mother   . Diabetes Mother   . Hypertension Mother   . Dementia Father   . Sudden death Brother        Suicide    Allergies  Allergen Reactions  . Oxycodone Hcl Itching and Other (See Comments)    confusion  . Penicillins Itching and Rash    Has patient had a PCN reaction causing immediate rash, facial/tongue/throat swelling, SOB or lightheadedness with hypotension: yes rash that took a while to go away across the  abdomen Has patient had a PCN reaction causing severe rash involving mucus membranes or skin necrosis: no Has patient had a PCN reaction that required hospitalization: no Has patient had a PCN reaction occurring within the last 10 years: No If all of the above answers are "NO", then may proceed with Cephalosporin use.   . Aspirin     Burning in stomach  . Macrobid [Nitrofurantoin Monohyd Macro] Nausea And Vomiting  . Sulfa Antibiotics Nausea Only  . Tramadol Itching    Seeing things    Current Outpatient Prescriptions on File Prior to Visit  Medication Sig Dispense Refill  . buPROPion (WELLBUTRIN XL) 150 MG 24 hr tablet TAKE 1 TABLET BY MOUTH EVERY DAY IN THE MORNING  1  . clopidogrel (PLAVIX) 75 MG tablet Take 1 tablet (75 mg total) by mouth daily. 90 tablet 3  . dexlansoprazole (DEXILANT) 60 MG capsule Take 1 capsule (60 mg total) by mouth daily. 30 capsule 2  . escitalopram (LEXAPRO) 20 MG tablet Take 20 mg by mouth daily.  1  . fluticasone (FLONASE) 50 MCG/ACT nasal spray Place 2 sprays into both nostrils daily as needed for allergies or rhinitis.    . metoprolol (LOPRESSOR) 50 MG tablet TAKE 1/2 TABLETS (25 MG TOTAL) BY MOUTH 2 (TWO) TIMES DAILY. 90 tablet 0  . NITROSTAT 0.4 MG SL tablet PLACE 1 TABLET (0.4 MG TOTAL) UNDER THE TONGUE EVERY 5 (FIVE) MINUTES AS NEEDED FOR CHEST PAIN. 25 tablet 0  . PREMARIN vaginal cream USE 1/2 GRAM VAGINALLY EVERY NIGHT AT BEDTIME FOR THE 1ST 2 WEEKS,, THEN 1/2 GRAM 2-3 TIMES A WEEK 30 g 1  . Probiotic Product (PROBIOTIC PO) Take 1 capsule by mouth daily. [employer]'s brand    . simvastatin (ZOCOR) 80 MG tablet TAKE 1 TABLET BY MOUTH AT BEDTIME 30 tablet 5  . UNABLE TO FIND Inject 1 mL into the skin every 30 (thirty) days. b12 shots twice a month at MDs office    . zolpidem (AMBIEN) 5 MG tablet TAKE 1 TABLET BY MOUTH AT BEDTIME 30 tablet 0  . ezetimibe (ZETIA) 10 MG tablet Take 1 tablet (10 mg total) by mouth daily. 90 tablet 3   No current  facility-administered medications on file prior to visit.     BP 134/80 (BP Location: Right Arm)   Temp 97.7 F (36.5 C) (Oral)   Ht 5' 1"  (1.549 m)   Wt 170 lb (77.1 kg)   LMP  (LMP Unknown)   BMI  32.12 kg/m       Objective:   Physical Exam  Constitutional: She is oriented to person, place, and time. She appears well-developed and well-nourished. No distress.  Eyes: Pupils are equal, round, and reactive to light. Conjunctivae are normal. Right eye exhibits no discharge. Left eye exhibits no discharge. No scleral icterus. Right eye exhibits nystagmus (horizontal ). Left eye exhibits nystagmus (horizontal ).  Cardiovascular: Normal rate, regular rhythm, normal heart sounds and intact distal pulses.  Exam reveals no gallop and no friction rub.   No murmur heard. Pulmonary/Chest: Effort normal and breath sounds normal. No respiratory distress. She has no wheezes. She has no rales. She exhibits no tenderness.  Musculoskeletal: Normal range of motion. She exhibits no edema, tenderness or deformity.  Neurological: She is alert and oriented to person, place, and time. She has normal strength and normal reflexes. She displays normal reflexes. No cranial nerve deficit or sensory deficit. She exhibits normal muscle tone. Coordination normal.  Skin: Skin is warm and dry. No rash noted. She is not diaphoretic. No erythema. No pallor.  Bruising noted to left lower back. No contusions to head were noticed   Psychiatric: She has a normal mood and affect. Her behavior is normal. Judgment and thought content normal.  Nursing note and vitals reviewed.     Assessment & Plan:  1. Syncope, unspecified syncope type - Unknown cause of syncope at this time. Doubt stroke as neuro exam was negative. Possibly cardiac with history of bradycardia. Will get CT of head. She was advised to follow up with Cardiology and possibly neurology  - EKG 12-Lead- Sinus  Rhythm  Voltage criteria for LVH  (R(aVF) exceeds 2.00  mV)  -Voltage criteria w/o ST/T abnormality may be normal.  - BMP with eGFR, Rate 76  - CBC with Differential/Platelet  2. Dizziness  -EKG 12-Lead  - BMP with eGFR - CBC with Differential/Platelet - meclizine (ANTIVERT) 25 MG tablet; Take 1 tablet (25 mg total) by mouth 2 (two) times daily.  Dispense: 30 tablet; Refill: 0 - CT Head Wo Contrast; Future- EKG 12-Lead   [name], NP   -

## 2017-04-01 ENCOUNTER — Telehealth: Payer: Self-pay | Admitting: Obstetrics and Gynecology

## 2017-04-01 ENCOUNTER — Ambulatory Visit (INDEPENDENT_AMBULATORY_CARE_PROVIDER_SITE_OTHER)
Admission: RE | Admit: 2017-04-01 | Discharge: 2017-04-01 | Disposition: A | Discharge status: Home / Self Care | Payer: 59 | Source: Ambulatory Visit | Attending: Adult Health | Admitting: Adult Health

## 2017-04-01 DIAGNOSIS — R55 Syncope and collapse: Secondary | ICD-10-CM

## 2017-04-01 DIAGNOSIS — R42 Dizziness and giddiness: Secondary | ICD-10-CM

## 2017-04-01 LAB — CBC WITH DIFFERENTIAL/PLATELET
BASOS ABS: 0.1 10*3/uL (ref 0.0–0.1)
BASOS PCT: 0.9 % (ref 0.0–3.0)
EOS ABS: 0.2 10*3/uL (ref 0.0–0.7)
Eosinophils Relative: 1.8 % (ref 0.0–5.0)
HCT: 40.1 % (ref 36.0–46.0)
Hemoglobin: 13.9 g/dL (ref 12.0–15.0)
Lymphocytes Relative: 33.7 % (ref 12.0–46.0)
Lymphs Abs: 2.7 10*3/uL (ref 0.7–4.0)
MCHC: 34.8 g/dL (ref 30.0–36.0)
MCV: 92.1 fl (ref 78.0–100.0)
MONO ABS: 0.7 10*3/uL (ref 0.1–1.0)
Monocytes Relative: 9.1 % (ref 3.0–12.0)
NEUTROS ABS: 4.4 10*3/uL (ref 1.4–7.7)
Neutrophils Relative %: 54.5 % (ref 43.0–77.0)
PLATELETS: 264 10*3/uL (ref 150.0–400.0)
RBC: 4.35 Mil/uL (ref 3.87–5.11)
RDW: 12.9 % (ref 11.5–15.5)
WBC: 8.1 10*3/uL (ref 4.0–10.5)

## 2017-04-01 LAB — BASIC METABOLIC PANEL WITH GFR
BUN: 8 mg/dL (ref 7–25)
CALCIUM: 9.2 mg/dL (ref 8.6–10.4)
CHLORIDE: 104 mmol/L (ref 98–110)
CO2: 23 mmol/L (ref 20–32)
CREATININE: 0.8 mg/dL (ref 0.60–0.93)
GFR, Est African American: 86 mL/min (ref >=60)
GFR, Est Non African American: 74 mL/min (ref >=60)
Glucose, Bld: 90 mg/dL (ref 65–99)
Potassium: 4.3 mmol/L (ref 3.5–5.3)
Sodium: 138 mmol/L (ref 135–146)

## 2017-04-01 NOTE — Telephone Encounter (Signed)
Spoke with patient. Reports right side pain "at ovaries" 7/10. Pain has been ongoing for over a month. Denies any other symptoms. Reports abdominal US completed at [facility] on 03/06/17, ordered by PCP, "normal" . Patient states next step is GYN.  Has Urology appointment on 04/09/17.  Recommended OV for further evaluation, patient declined to schedule with Dr. Quincy Simmonds, request another provider. Patient scheduled for OV with Dr. Talbert Nan on 04/02/17 at 10:15am. Patient declined earlier OV for today d/t another appointment. ER precautions reviewed. Patient verbalizes understanding and is agreeable.   Routing to provider for final review. Patient is agreeable to disposition. Will close encounter.   Cc: Dr. Quincy Simmonds

## 2017-04-01 NOTE — Telephone Encounter (Signed)
Patient is having pain on her right side that is going into her stomach.

## 2017-04-02 ENCOUNTER — Ambulatory Visit: Payer: Self-pay | Admitting: Obstetrics and Gynecology

## 2017-04-02 ENCOUNTER — Encounter: Payer: Self-pay | Admitting: Obstetrics and Gynecology

## 2017-04-02 ENCOUNTER — Other Ambulatory Visit: Payer: Self-pay | Admitting: Family Medicine

## 2017-04-02 ENCOUNTER — Ambulatory Visit (INDEPENDENT_AMBULATORY_CARE_PROVIDER_SITE_OTHER): Payer: 59 | Admitting: Obstetrics and Gynecology

## 2017-04-02 ENCOUNTER — Other Ambulatory Visit: Payer: Self-pay | Admitting: Adult Health

## 2017-04-02 VITALS — BP 122/70 | HR 80 | Resp 16 | Wt 172.0 lb

## 2017-04-02 DIAGNOSIS — R3129 Other microscopic hematuria: Secondary | ICD-10-CM | POA: Diagnosis not present

## 2017-04-02 DIAGNOSIS — R194 Change in bowel habit: Secondary | ICD-10-CM

## 2017-04-02 DIAGNOSIS — R1031 Right lower quadrant pain: Secondary | ICD-10-CM

## 2017-04-02 DIAGNOSIS — N952 Postmenopausal atrophic vaginitis: Secondary | ICD-10-CM

## 2017-04-02 DIAGNOSIS — R4189 Other symptoms and signs involving cognitive functions and awareness: Secondary | ICD-10-CM

## 2017-04-02 DIAGNOSIS — R195 Other fecal abnormalities: Secondary | ICD-10-CM | POA: Diagnosis not present

## 2017-04-02 DIAGNOSIS — N949 Unspecified condition associated with female genital organs and menstrual cycle: Secondary | ICD-10-CM | POA: Diagnosis not present

## 2017-04-02 LAB — POCT URINALYSIS DIPSTICK
Bilirubin, UA: NEGATIVE
GLUCOSE UA: NEGATIVE
Ketones, UA: NEGATIVE
NITRITE UA: NEGATIVE
PH UA: 5 (ref 5.0–8.0)
PROTEIN UA: NEGATIVE
UROBILINOGEN UA: 0.2 U/dL

## 2017-04-02 LAB — FECAL OCCULT BLOOD, IMMUNOCHEMICAL
FECAL OCCULT BLD: NEGATIVE
FECAL OCCULT BLD: NEGATIVE

## 2017-04-02 NOTE — Progress Notes (Signed)
GYNECOLOGY  VISIT   HPI: 71 y.o.   Married  Caucasian  female   G2P2 with No LMP recorded (lmp unknown). Patient is postmenopausal.   here for RLQ pain X 6 weeks that's getting worse. She c/o a constant RLQ abdominal pain, gets worse as the day goes on. Radiates to the midline. Baseline pain is 3/10, crampy/sharp, can get up to a 7/10 in severity. She reports BM every 3-4 days, used to be daily. The bowel changes have been in approximately the last month. Not hard, stools are soft and of a different shape than normal. No melena or hematochezia. No urinary c/o. No fevers.  Last colonoscopy was in 2011 She had an abdominal ultrasound with GI for upper abdominal pain in 5/18, it was negative She c/o a 2 year h/o vaginal burning, feels up inside her vagina. No abnormal vaginal d/c. She uses premarin vaginal cream 2 x a week, has for 3-4 months. It has helped some. Symptoms aren't as obvious during the day as it used to be. Notices it when she goes to bed. No vaginal d/c or bleeding. Vagina feels dry. She hasn't tried the replens since she has been on the premarin.  Not sexually active.   GYNECOLOGIC HISTORY: No LMP recorded (lmp unknown). Patient is postmenopausal. Contraception:postmenopause  Menopausal hormone therapy: Premarin cream        OB History    Gravida Para Term Preterm AB Living   2 2       2    SAB TAB Ectopic Multiple Live Births                     Patient Active Problem List   Diagnosis Date Noted  . RAD (reactive airway disease) 04/01/2016  . Dyspnea 02/20/2016  . Bradycardia 01/27/2016  . B12 deficiency 07/11/2015  . Anemia associated with acute blood loss 05/28/2014  . Cigarette smoker 05/28/2014  . OA (osteoarthritis) of hip 05/24/2014  . Chest pain 12/19/2013  . History of non-ST elevation myocardial infarction (NSTEMI) 05/18/2012  . Allergic rhinitis due to pollen 04/01/2012  . Back pain 11/22/2010  . NEOPLASM OF UNCERTAIN BEHAVIOR OF SKIN 06/05/2010  .  Swallowing difficulty 06/04/2010  . GERD 07/30/2009  . Hyperlipidemia 03/02/2007  . Depression 03/02/2007  . Coronary artery disease involving native heart without angina pectoris 03/02/2007    Past Medical History:  Diagnosis Date  . Arthritis   . Blood transfusion without reported diagnosis   . CAD (coronary artery disease)    a. 1997 MI/PCI RCA;  b. 2007 MI/PCI of 100% RCA with Taxus DES x 3 placed, EF 55%;  c. 2010 Cath: stable anatomy;  d. 05/2012 NSTEMI/Cath/PCI: LM nl, LAD 60-58m, D1 20ost, LCX 63m, RCA 40-34m ISR, 60/95d (Treated w/ 2.75x33 Xience Xpedition DES), PDA 54m (Treated w/ PTCA), EF 60%.  . Carpal tunnel syndrome on both sides   . Chronic lower back pain   . Depression   . GERD (gastroesophageal reflux disease)   . Hyperlipidemia   . Myocardial infarction [facility]) 1997, 2007, 2013  . Numbness of foot    Left foot - back surgery 2009    Past Surgical History:  Procedure Laterality Date  . ANKLE SURGERY  Years ago   Left; "had to take out a floater"  . CARPAL TUNNEL RELEASE Left 10/12016  . CESAREAN SECTION  1990  . CORONARY ANGIOPLASTY WITH STENT PLACEMENT  1997; 2007, 2013   "2 + 2; + 1= total of 5  .  LEFT HEART CATHETERIZATION WITH CORONARY ANGIOGRAM N/A 05/18/2012   Procedure: LEFT HEART CATHETERIZATION WITH CORONARY ANGIOGRAM;  Surgeon: Wellington Hampshire, MD;  Location: [facility] CATH LAB;  Service: Cardiovascular;  Laterality: N/A;  . [place]; 2009  . TOTAL HIP ARTHROPLASTY Right 05/24/2014   Procedure: RIGHT TOTAL HIP ARTHROPLASTY ANTERIOR APPROACH;  Surgeon: Gearlean Alf, MD;  Location: [facility];  Service: Orthopedics;  Laterality: Right;    Current Outpatient Prescriptions  Medication Sig Dispense Refill  . buPROPion (WELLBUTRIN XL) 150 MG 24 hr tablet TAKE 1 TABLET BY MOUTH EVERY DAY IN THE MORNING  1  . clopidogrel (PLAVIX) 75 MG tablet Take 1 tablet (75 mg total) by mouth daily. 90 tablet 3  . dexlansoprazole (DEXILANT) 60 MG  capsule Take 1 capsule (60 mg total) by mouth daily. 30 capsule 2  . escitalopram (LEXAPRO) 20 MG tablet Take 20 mg by mouth daily.  1  . ezetimibe (ZETIA) 10 MG tablet Take 1 tablet (10 mg total) by mouth daily. 90 tablet 3  . fluticasone (FLONASE) 50 MCG/ACT nasal spray Place 2 sprays into both nostrils daily as needed for allergies or rhinitis.    Marland Kitchen meclizine (ANTIVERT) 25 MG tablet Take 1 tablet (25 mg total) by mouth 2 (two) times daily. 30 tablet 0  . metoprolol (LOPRESSOR) 50 MG tablet TAKE 1/2 TABLETS (25 MG TOTAL) BY MOUTH 2 (TWO) TIMES DAILY. 90 tablet 0  . NITROSTAT 0.4 MG SL tablet PLACE 1 TABLET (0.4 MG TOTAL) UNDER THE TONGUE EVERY 5 (FIVE) MINUTES AS NEEDED FOR CHEST PAIN. 25 tablet 0  . PREMARIN vaginal cream USE 1/2 GRAM VAGINALLY EVERY NIGHT AT BEDTIME FOR THE 1ST 2 WEEKS,, THEN 1/2 GRAM 2-3 TIMES A WEEK 30 g 1  . Probiotic Product (PROBIOTIC PO) Take 1 capsule by mouth daily. Nature Bounty's brand    . simvastatin (ZOCOR) 80 MG tablet TAKE 1 TABLET BY MOUTH AT BEDTIME 30 tablet 5  . UNABLE TO FIND Inject 1 mL into the skin every 30 (thirty) days. b12 shots twice a month at MDs office    . zolpidem (AMBIEN) 5 MG tablet TAKE 1 TABLET BY MOUTH AT BEDTIME 30 tablet 0   No current facility-administered medications for this visit.      ALLERGIES: Oxycodone hcl; Penicillins; Aspirin; Macrobid [nitrofurantoin monohyd macro]; Sulfa antibiotics; and Tramadol  Family History  Problem Relation Age of Onset  . Cancer Paternal Grandfather        Esophageal  . Coronary artery disease Mother   . Diabetes Mother   . Hypertension Mother   . Dementia Father   . Sudden death Brother        Suicide    Social History   Social History  . Marital status: Married    Spouse name: N/A  . Number of children: N/A  . Years of education: N/A   Occupational History  . Not on file.   Social History Main Topics  . Smoking status: Light Tobacco Smoker    Packs/day: 0.50    Years: 50.00     Types: Cigarettes  . Smokeless tobacco: Never Used     Comment: smokes a half pack a day  . Alcohol use No  . Drug use: No  . Sexual activity: Not Currently    Birth control/ protection: Post-menopausal   Other Topics Concern  . Not on file   Social History Narrative   Is a homemaker   Married for 40 years  Has a daughter and son    Pets: Two dogs and a cat   Likes to garden ( flower beds).           Review of Systems  Constitutional: Negative.   HENT: Negative.   Eyes: Negative.   Respiratory: Negative.   Gastrointestinal: Negative.   Genitourinary:       RLQ pain   Musculoskeletal: Negative.   Skin: Negative.   Neurological: Negative.   Endo/Heme/Allergies: Negative.   Psychiatric/Behavioral: Negative.     PHYSICAL EXAMINATION:    BP 122/70 (BP Location: Right Arm, Patient Position: Sitting, Cuff Size: Normal)   Pulse 80   Resp 16   Wt 172 lb (78 kg)   LMP  (LMP Unknown)   BMI 32.50 kg/m     General appearance: alert, cooperative and appears stated age CVA: not tender Abdomen: soft, non-tender; mildly distended; no masses,  no organomegaly  Pelvic: External genitalia:  no lesions, not tender at the vestibule              Urethra:  normal appearing urethra with no masses, tenderness or lesions              Bartholins and Skenes: normal                 Vagina: normal appearing vagina with normal color and discharge, no lesions              Cervix: no lesions, no CMT              Bimanual Exam:  Uterus:  normal size, contour, position, consistency, mobility, non-tender              Adnexa: not tender, fullness on the right, ? stool              Rectovaginal: Yes.  .  Confirms.              Anus:  normal sphincter tone, no lesions  Bladder: not tender  Pelvic floor: not tender Stool: guaiac negative  Chaperone was present for exam.  ASSESSMENT RLQ abdominal pain, some fullness in the right adnexa, may be stool Bowel changes, change in form of  stool Vaginal burning Mild vaginal atrophy Small blood on urine dip, will send for ua, c&s    PLAN Return for pelvic ultrasound Recommend colonoscopy Affirm sent She will continue with the premarin cream Recommended she try adding replense vaginally to see if that helps. No signs of vulvodynia   An After Visit Summary was printed and given to the patient.  Over 25 minutes face to face time of which over 50% was spent in counseling.   CC: Bianca Peng, NP         Ranelle Oyster, MD

## 2017-04-03 ENCOUNTER — Encounter: Payer: Self-pay | Admitting: Psychology

## 2017-04-03 ENCOUNTER — Telehealth: Payer: Self-pay

## 2017-04-03 LAB — URINALYSIS, MICROSCOPIC ONLY: CASTS: NONE SEEN /LPF

## 2017-04-03 LAB — VAGINITIS/VAGINOSIS, DNA PROBE
Candida Species: NEGATIVE
Gardnerella vaginalis: NEGATIVE
Trichomonas vaginosis: NEGATIVE

## 2017-04-03 LAB — URINE CULTURE

## 2017-04-03 NOTE — Telephone Encounter (Signed)
The pt has been scheduled to see Dr Ardis Hughs.  She has been advised

## 2017-04-03 NOTE — Telephone Encounter (Signed)
-----   Message from Milus Banister, MD sent at 04/02/2017  4:50 PM EDT ----- Burnis Medin get her back in to talk about it.  Nicolus Ose, Can you call her to offer my next available OV.   Thanks   ----- Message ----- From: Salvadore Dom, MD Sent: 04/02/2017   1:26 PM To: Milus Banister, MD  [facility] Tommi Rumps, Please see attached office visit. Ms Steinhaus presented c/o 6 week h/o RLQ abdominal pain. She also c/o a change in her bowel frequency and consistency. On exam she has some right adnexal fullness. I'm having come back for a GYN ultrasound, but recommend she f/u with you to discuss colonoscopy. I hope you're having a great summer! Thanks for you help, Sharee Pimple

## 2017-04-05 ENCOUNTER — Other Ambulatory Visit: Payer: Self-pay | Admitting: Adult Health

## 2017-04-05 DIAGNOSIS — G4700 Insomnia, unspecified: Secondary | ICD-10-CM

## 2017-04-05 NOTE — Progress Notes (Deleted)
Cardiology Office Note   Date:  04/05/2017   ID:  Bianca [name], DOB 19-Jan-1946, MRN [id number]  PCP:  [name], NP  Cardiologist:   Dorris Carnes, MD   No chief complaint on file.     History of Present Illness: Bianca [name] is a 71 y.o. female with a history of CAD, s/p MI in 1997 treated with PCI to the RCA, s/p Taxus DES x3 the RCA in 2007 in the setting of MI, HTN, HL. Echo 7/10: EF 55-60%, mild MR. She was  admitted 9/30-10/2/13 with a non-STEMI. Marland Kitchen LHC 05/18/12: mLAD 60-70%, oD1 20%, mCFX 50%, proximal to distal RCA with multiple overlapping stents, mid 40-50% ISR, distal 60/95%, mPDA 80%, EF 60%. PCI: Xience expedition DES to the RCA and balloon angioplasty to the PDA. It was noted that if the patient develops further restenosis in the RCA, best treatment option would probably CABG.  The last time I saw her in clinic was 2016  She was seen by B Bhagat in May  Complained of CP  Difficult to get good history  Recomm myovuew    Current Outpatient Prescriptions  Medication Sig Dispense Refill  . buPROPion (WELLBUTRIN XL) 150 MG 24 hr tablet TAKE 1 TABLET BY MOUTH EVERY DAY IN THE MORNING  1  . clopidogrel (PLAVIX) 75 MG tablet Take 1 tablet (75 mg total) by mouth daily. 90 tablet 3  . dexlansoprazole (DEXILANT) 60 MG capsule Take 1 capsule (60 mg total) by mouth daily. 30 capsule 2  . escitalopram (LEXAPRO) 20 MG tablet Take 20 mg by mouth daily.  1  . ezetimibe (ZETIA) 10 MG tablet Take 1 tablet (10 mg total) by mouth daily. 90 tablet 3  . fluticasone (FLONASE) 50 MCG/ACT nasal spray Place 2 sprays into both nostrils daily as needed for allergies or rhinitis.    Marland Kitchen meclizine (ANTIVERT) 25 MG tablet Take 1 tablet (25 mg total) by mouth 2 (two) times daily. 30 tablet 0  . metoprolol (LOPRESSOR) 50 MG tablet TAKE 1/2 TABLETS (25 MG TOTAL) BY MOUTH 2 (TWO) TIMES DAILY. 90 tablet 0  . NITROSTAT 0.4 MG SL tablet PLACE 1 TABLET (0.4 MG TOTAL) UNDER THE TONGUE EVERY 5 (FIVE) MINUTES  AS NEEDED FOR CHEST PAIN. 25 tablet 0  . PREMARIN vaginal cream USE 1/2 GRAM VAGINALLY EVERY NIGHT AT BEDTIME FOR THE 1ST 2 WEEKS,, THEN 1/2 GRAM 2-3 TIMES A WEEK 30 g 1  . Probiotic Product (PROBIOTIC PO) Take 1 capsule by mouth daily. Nature Bounty's brand    . simvastatin (ZOCOR) 80 MG tablet TAKE 1 TABLET BY MOUTH AT BEDTIME 30 tablet 5  . UNABLE TO FIND Inject 1 mL into the skin every 30 (thirty) days. b12 shots twice a month at MDs office    . zolpidem (AMBIEN) 5 MG tablet TAKE 1 TABLET BY MOUTH AT BEDTIME 30 tablet 0   No current facility-administered medications for this visit.     Allergies:   Oxycodone hcl; Penicillins; Aspirin; Macrobid [nitrofurantoin monohyd macro]; Sulfa antibiotics; and Tramadol   Past Medical History:  Diagnosis Date  . Arthritis   . Blood transfusion without reported diagnosis   . CAD (coronary artery disease)    a. 1997 MI/PCI RCA;  b. 2007 MI/PCI of 100% RCA with Taxus DES x 3 placed, EF 55%;  c. 2010 Cath: stable anatomy;  d. 05/2012 NSTEMI/Cath/PCI: LM nl, LAD 60-53m, D1 20ost, LCX 24m, RCA 40-36m ISR, 60/95d (Treated w/ 2.75x33 Xience Xpedition DES),  PDA 57m (Treated w/ PTCA), EF 60%.  . Carpal tunnel syndrome on both sides   . Chronic lower back pain   . Depression   . GERD (gastroesophageal reflux disease)   . Hyperlipidemia   . Myocardial infarction [facility]) 1997, 2007, 2013  . Numbness of foot    Left foot - back surgery 2009    Past Surgical History:  Procedure Laterality Date  . ANKLE SURGERY  Years ago   Left; "had to take out a floater"  . CARPAL TUNNEL RELEASE Left 10/12016  . CESAREAN SECTION  1990  . CORONARY ANGIOPLASTY WITH STENT PLACEMENT  1997; 2007, 2013   "2 + 2; + 1= total of 5  . LEFT HEART CATHETERIZATION WITH CORONARY ANGIOGRAM N/A 05/18/2012   Procedure: LEFT HEART CATHETERIZATION WITH CORONARY ANGIOGRAM;  Surgeon: Wellington Hampshire, MD;  Location: [facility] CATH LAB;  Service: Cardiovascular;  Laterality: N/A;  . [place]; 2009  . TOTAL HIP ARTHROPLASTY Right 05/24/2014   Procedure: RIGHT TOTAL HIP ARTHROPLASTY ANTERIOR APPROACH;  Surgeon: Gearlean Alf, MD;  Location: [facility] ORS;  Service: Orthopedics;  Laterality: Right;     Social History:  The patient  reports that she has been smoking Cigarettes.  She has a 25.00 pack-year smoking history. She has never used smokeless tobacco. She reports that she does not drink alcohol or use drugs.   Family History:  The patient's family history includes Cancer in her paternal grandfather; Coronary artery disease in her mother; Dementia in her father; Diabetes in her mother; Hypertension in her mother; Sudden death in her brother.    ROS:  Please see the history of present illness. All other systems are reviewed and  Negative to the above problem except as noted.    PHYSICAL EXAM: VS:  LMP  (LMP Unknown)   GEN: Well nourished, well developed, in no acute distress HEENT: normal Neck: no JVD, carotid bruits, or masses Cardiac: RRR; no murmurs, rubs, or gallops,no edema  Respiratory:  clear to auscultation bilaterally, normal work of breathing GI: soft, nontender, nondistended, + BS  No hepatomegaly  MS: no deformity Moving all extremities   Skin: warm and dry, no rash Neuro:  Strength and sensation are intact Psych: euthymic mood, full affect   EKG:  EKG is not ordered today.   Lipid Panel    Component Value Date/Time   CHOL 233 (H) 10/31/2016 0915   TRIG 316.0 (H) 10/31/2016 0915   HDL 48.80 10/31/2016 0915   CHOLHDL 5 10/31/2016 0915   VLDL 63.2 (H) 10/31/2016 0915   LDLCALC 106 (H) 01/03/2014 0939   LDLDIRECT 129.0 10/31/2016 0915      Wt Readings from Last 3 Encounters:  04/02/17 172 lb (78 kg)  03/31/17 170 lb (77.1 kg)  03/17/17 170 lb (77.1 kg)      ASSESSMENT AND PLAN:  1  CAD  I am not convinced any active ischemia  No chagne in regimen  2.  HL  Needs tighter control of lipids Needs to check on pricing for Zetia     3    HTN  4  Remote tobaccco  4  Memor     Disposition:   FU with me next year    Signed, Dorris Carnes, MD  04/05/2017 11:21 PM    [address], [place], [place]  [address] Phone: [phone]; Fax: [phone]

## 2017-04-06 ENCOUNTER — Telehealth: Payer: Self-pay | Admitting: Obstetrics and Gynecology

## 2017-04-06 ENCOUNTER — Ambulatory Visit: Payer: 59 | Admitting: Internal Medicine

## 2017-04-06 DIAGNOSIS — N949 Unspecified condition associated with female genital organs and menstrual cycle: Secondary | ICD-10-CM

## 2017-04-06 DIAGNOSIS — R195 Other fecal abnormalities: Secondary | ICD-10-CM

## 2017-04-06 DIAGNOSIS — R194 Change in bowel habit: Secondary | ICD-10-CM

## 2017-04-06 DIAGNOSIS — R1031 Right lower quadrant pain: Secondary | ICD-10-CM

## 2017-04-06 NOTE — Telephone Encounter (Signed)
Spoke with patient regarding benefit for transvaginal ultrasound. Patient understood and agreeable. Patient notes she believes she should proceed with a recommended colonoscopy prior to an ultrasound based on multiple factors.   Patient has a consult scheduled at [facility] on 05-15-17, but is not scheduled for a colonoscopy currently.   Patient agreeable to return call from nurse to discuss concerns.   Please enter referral for colonoscopy if recommended.   Routing to triage for review.

## 2017-04-07 NOTE — Telephone Encounter (Signed)
Called to the pharmacy and left on machine. 

## 2017-04-07 NOTE — Telephone Encounter (Signed)
Spoke with patient. Patient declines to schedule PUS at this time. Patient states she is aware of Dr. Gentry Fitz recommendations, would like to have colonoscopy first. Patient previously scheduled for f/u with Dr. Ardis Hughs on 9/28. Patient states she is going to try to have this appointment moved up to earlier date. Referral placed for colonoscopy as recommended on 04/02/17. Patient aware to return call to office with any additional questions and to schedule PUS.   Advised patient would review with Dr. Talbert Nan and return call with any additional recommendations, patient verbalizes understanding.  Order placed for referral to [facility] for colonoscopy.  Routing to provider for final review. Patient is agreeable to disposition. Will close encounter.   Cc: [name]

## 2017-04-07 NOTE — Telephone Encounter (Signed)
Ok to refill 

## 2017-04-07 NOTE — Telephone Encounter (Signed)
I think her pain is likely GI and think it is reasonable to start there. When I did her exam she has some fullness in the right adnexa. That fullness may very well be stool. If she doesn't want to come back for the gyn ultrasound, please have her come back for a repeat pelvic exam to make sure that fullness resolves.

## 2017-04-07 NOTE — Telephone Encounter (Signed)
Filled on 03/06/17 for #30 Last seen 03/31/17 No future appt scheduled  Please advise.  Thanks!!

## 2017-04-07 NOTE — Telephone Encounter (Signed)
Spoke with patient, advised as seen below per Dr. Talbert Nan. Patient declines to schedule OV at this time, will return call at later date to schedule. Patient verbalizes understanding.  Patient is agreeable to disposition. Will close encounter.

## 2017-05-04 ENCOUNTER — Ambulatory Visit: Payer: 59 | Admitting: Internal Medicine

## 2017-05-07 ENCOUNTER — Ambulatory Visit: Payer: 59 | Admitting: Family Medicine

## 2017-05-07 ENCOUNTER — Telehealth: Payer: Self-pay | Admitting: *Deleted

## 2017-05-07 NOTE — Telephone Encounter (Signed)
Spoke with patient and scheduled for 05-13-17 -eh    Bianca [name],   Please call [name]. She was being scheduled for the ultrasound for pain, but she was also noted to have fullness on pelvic exam. If she isn't going to do the ultrasound, I would at least recommend that she return for a repeat pelvic exam to make sure the fullness resolved (fullness can be stool or ovary).  Thanks,  Sharee Pimple

## 2017-05-07 NOTE — Progress Notes (Addendum)
Cardiology Office Note   Date:  05/08/2017   ID:  Bianca [name], DOB February 13, 1946, MRN [id number]  PCP:  [name], NP  Cardiologist:   Dorris Carnes, MD   Pt presents for f/u of CAD       History of Present Illness: Bianca [name] is a 71 y.o. female with a history of CAD, s/p MI in 1997 treated with PCI to the RCA, s/p Taxus DES x3 the RCA in 2007 in the setting of MI, HTN, HL. Echo 7/10: EF 55-60%, mild MR. She was  admitted 9/30-10/2/13 with a non-STEMI. Marland Kitchen LHC 05/18/12: mLAD 60-70%, oD1 20%, mCFX 50%, proximal to distal RCA with multiple overlapping stents, mid 40-50% ISR, distal 60/95%, mPDA 80%, EF 60%. PCI: Xience expedition DES to the RCA and balloon angioplasty to the PDA. Recomm lifelong DAPT if possible.  t was noted that if the patient develops further restenosis in the RCA, best treatment option would probably CABG.  The last time I saw her in clinic was July 2016  She was admitted in June 2017 with CP  R/O for MI  Echo normal  Myovue recommended but she did not pursue   She was seen by Kathleen Argue in October 2017  Doing good at time   She was seen by B Bhagat in May  Complained of R sided CP that radiated to L  Relieved with 1 NTG  Myovue was normal   Lipids in March LDL 128    NIght before last SOB walking  She notes more SOB  Does have some wheezing  Nighttime cough  Dry COnntinues to smoke about 7 cigs per day   No CP   Pt is very bothered by back pain  Wt up    Current Outpatient Prescriptions  Medication Sig Dispense Refill  . buPROPion (WELLBUTRIN XL) 150 MG 24 hr tablet TAKE 1 TABLET BY MOUTH EVERY DAY IN THE MORNING  1  . clopidogrel (PLAVIX) 75 MG tablet Take 1 tablet (75 mg total) by mouth daily. 90 tablet 3  . dexlansoprazole (DEXILANT) 60 MG capsule Take 1 capsule (60 mg total) by mouth daily. 30 capsule 2  . escitalopram (LEXAPRO) 20 MG tablet Take 20 mg by mouth daily.  1  . ezetimibe (ZETIA) 10 MG tablet Take 1 tablet (10 mg total) by mouth daily. 90 tablet  3  . fluticasone (FLONASE) 50 MCG/ACT nasal spray Place 2 sprays into both nostrils daily as needed for allergies or rhinitis.    Marland Kitchen meclizine (ANTIVERT) 25 MG tablet Take 1 tablet (25 mg total) by mouth 2 (two) times daily. 30 tablet 0  . Methen-Hyosc-Meth Blue-Na Phos (ME/NAPHOS/MB/HYO1 PO) Take 1 tablet by mouth every 6 (six) hours as needed.    . metoprolol (LOPRESSOR) 50 MG tablet TAKE 1/2 TABLETS (25 MG TOTAL) BY MOUTH 2 (TWO) TIMES DAILY. 90 tablet 0  . NITROSTAT 0.4 MG SL tablet PLACE 1 TABLET (0.4 MG TOTAL) UNDER THE TONGUE EVERY 5 (FIVE) MINUTES AS NEEDED FOR CHEST PAIN. 25 tablet 0  . pentosan polysulfate (ELMIRON) 100 MG capsule Take 200 mg by mouth 2 (two) times daily.    Marland Kitchen PREMARIN vaginal cream USE 1/2 GRAM VAGINALLY EVERY NIGHT AT BEDTIME FOR THE 1ST 2 WEEKS,, THEN 1/2 GRAM 2-3 TIMES A WEEK 30 g 1  . Probiotic Product (PROBIOTIC PO) Take 1 capsule by mouth daily. Nature Bounty's brand    . simvastatin (ZOCOR) 80 MG tablet TAKE 1 TABLET BY MOUTH AT  BEDTIME 30 tablet 5  . UNABLE TO FIND Inject 1 mL into the skin every 30 (thirty) days. b12 shots twice a month at MDs office    . zolpidem (AMBIEN) 5 MG tablet TAKE 1 TABLET BY MOUTH AT BEDTIME 30 tablet 0   No current facility-administered medications for this visit.     Allergies:   Oxycodone hcl; Penicillins; Aspirin; Macrobid [nitrofurantoin monohyd macro]; Sulfa antibiotics; and Tramadol   Past Medical History:  Diagnosis Date  . Arthritis   . Blood transfusion without reported diagnosis   . CAD (coronary artery disease)    a. 1997 MI/PCI RCA;  b. 2007 MI/PCI of 100% RCA with Taxus DES x 3 placed, EF 55%;  c. 2010 Cath: stable anatomy;  d. 05/2012 NSTEMI/Cath/PCI: LM nl, LAD 60-19m, D1 20ost, LCX 70m, RCA 40-1m ISR, 60/95d (Treated w/ 2.75x33 Xience Xpedition DES), PDA 67m (Treated w/ PTCA), EF 60%.  . Carpal tunnel syndrome on both sides   . Chronic lower back pain   . Depression   . GERD (gastroesophageal reflux disease)     . Hyperlipidemia   . Myocardial infarction [facility]) 1997, 2007, 2013  . Numbness of foot    Left foot - back surgery 2009    Past Surgical History:  Procedure Laterality Date  . ANKLE SURGERY  Years ago   Left; "had to take out a floater"  . CARPAL TUNNEL RELEASE Left 10/12016  . CESAREAN SECTION  1990  . CORONARY ANGIOPLASTY WITH STENT PLACEMENT  1997; 2007, 2013   "2 + 2; + 1= total of 5  . LEFT HEART CATHETERIZATION WITH CORONARY ANGIOGRAM N/A 05/18/2012   Procedure: LEFT HEART CATHETERIZATION WITH CORONARY ANGIOGRAM;  Surgeon: Wellington Hampshire, MD;  Location: [place] CATH LAB;  Service: Cardiovascular;  Laterality: N/A;  . [place]; 2009  . TOTAL HIP ARTHROPLASTY Right 05/24/2014   Procedure: RIGHT TOTAL HIP ARTHROPLASTY ANTERIOR APPROACH;  Surgeon: Gearlean Alf, MD;  Location: [facility];  Service: Orthopedics;  Laterality: Right;     Social History:  The patient  reports that she has been smoking Cigarettes.  She has a 25.00 pack-year smoking history. She has never used smokeless tobacco. She reports that she does not drink alcohol or use drugs.   Family History:  The patient's family history includes Cancer in her paternal grandfather; Coronary artery disease in her mother; Dementia in her father; Diabetes in her mother; Hypertension in her mother; Sudden death in her brother.    ROS:  Please see the history of present illness. All other systems are reviewed and  Negative to the above problem except as noted.    PHYSICAL EXAM: VS:  BP 122/64   Pulse (!) 55   Ht 5\' 1"  (1.549 m)   Wt 184 lb 12.8 oz (83.8 kg)   LMP  (LMP Unknown)   SpO2 95%   BMI 34.92 kg/m   GEN: Well nourished, well developed, in no acute distress  HEENT: normal  Neck: no JVD, carotid bruits, or masses Cardiac: RRR; no murmurs, rubs, or gallops,no edema  Respiratory: Decreased airflow  Wheezes with forced expiration  GI: soft, nontender, nondistended, + BS  No hepatomegaly  MS: no  deformity Moving all extremities   Skin: warm and dry, no rash Neuro:  Strength and sensation are intact Psych: euthymic mood, full affect   EKG:  EKG is not ordered today.   Lipid Panel    Component Value Date/Time   CHOL  233 (H) 10/31/2016 0915   TRIG 316.0 (H) 10/31/2016 0915   HDL 48.80 10/31/2016 0915   CHOLHDL 5 10/31/2016 0915   VLDL 63.2 (H) 10/31/2016 0915   LDLCALC 106 (H) 01/03/2014 0939   LDLDIRECT 129.0 10/31/2016 0915      Wt Readings from Last 3 Encounters:  05/08/17 184 lb 12.8 oz (83.8 kg)  04/02/17 172 lb (78 kg)  03/31/17 170 lb (77.1 kg)      ASSESSMENT AND PLAN:  1  Dyspnea  Some wheezing on exam  Tight  ? If exacerbated by reflux  She is on dexilantCAD  I am not convinced any active ischemia  ? If fluid  Will give Rx for inhaled steroid.  Continue dexilant   Check labs  F?U based on test results   2. CAD   I am not convinced of active angina    3   HTN  COntinue meds    3  HL  On simvistatin and zetia  Need to f/u lipids    4  Tobacco use  Counselled on cessation       Disposition:   FU with me next year    Signed, Dorris Carnes, MD  05/08/2017 8:39 AM    [facility] [place], [place], [place]  [address] Phone: [phone]; Fax: [phone]

## 2017-05-07 NOTE — Progress Notes (Deleted)
[name] [address] [place], [place] [address] Phone: [phone] Subjective:    I'm seeing this patient by the request  of:    CC:   [id number]  Bianca [name] is a 71 y.o. female coming in with complaint of ***  Onset-  Location Duration-  Character- Aggravating factors- Reliving factors-  Therapies tried-  Severity-     Past Medical History:  Diagnosis Date  . Arthritis   . Blood transfusion without reported diagnosis   . CAD (coronary artery disease)    a. 1997 MI/PCI RCA;  b. 2007 MI/PCI of 100% RCA with Taxus DES x 3 placed, EF 55%;  c. 2010 Cath: stable anatomy;  d. 05/2012 NSTEMI/Cath/PCI: LM nl, LAD 60-61m, D1 20ost, LCX 4m, RCA 40-61m ISR, 60/95d (Treated w/ 2.75x33 Xience Xpedition DES), PDA 93m (Treated w/ PTCA), EF 60%.  . Carpal tunnel syndrome on both sides   . Chronic lower back pain   . Depression   . GERD (gastroesophageal reflux disease)   . Hyperlipidemia   . Myocardial infarction [facility]) 1997, 2007, 2013  . Numbness of foot    Left foot - back surgery 2009   Past Surgical History:  Procedure Laterality Date  . ANKLE SURGERY  Years ago   Left; "had to take out a floater"  . CARPAL TUNNEL RELEASE Left 10/12016  . CESAREAN SECTION  1990  . CORONARY ANGIOPLASTY WITH STENT PLACEMENT  1997; 2007, 2013   "2 + 2; + 1= total of 5  . LEFT HEART CATHETERIZATION WITH CORONARY ANGIOGRAM N/A 05/18/2012   Procedure: LEFT HEART CATHETERIZATION WITH CORONARY ANGIOGRAM;  Surgeon: Wellington Hampshire, MD;  Location: [place] CATH LAB;  Service: Cardiovascular;  Laterality: N/A;  . [place]; 2009  . TOTAL HIP ARTHROPLASTY Right 05/24/2014   Procedure: RIGHT TOTAL HIP ARTHROPLASTY ANTERIOR APPROACH;  Surgeon: Gearlean Alf, MD;  Location: [facility];  Service: Orthopedics;  Laterality: Right;   Social History   Social History  . Marital status: Married    Spouse name: N/A  . Number of children: N/A  . Years  of education: N/A   Social History Main Topics  . Smoking status: Light Tobacco Smoker    Packs/day: 0.50    Years: 50.00    Types: Cigarettes  . Smokeless tobacco: Never Used     Comment: smokes a half pack a day  . Alcohol use No  . Drug use: No  . Sexual activity: Not Currently    Birth control/ protection: Post-menopausal   Other Topics Concern  . Not on file   Social History Narrative   Is a homemaker   Married for 80 years    Has a daughter and son    Pets: Two dogs and a Neurosurgeon   Likes to garden ( flower beds).          Allergies  Allergen Reactions  . Oxycodone Hcl Itching and Other (See Comments)    confusion  . Penicillins Itching and Rash    Has patient had a PCN reaction causing immediate rash, facial/tongue/throat swelling, SOB or lightheadedness with hypotension: yes rash that took a while to go away across the abdomen Has patient had a PCN reaction causing severe rash involving mucus membranes or skin necrosis: no Has patient had a PCN reaction that required hospitalization: no Has patient had a PCN reaction occurring within the last 10 years: No If all of the above answers  are "NO", then may proceed with Cephalosporin use.   . Aspirin     Burning in stomach  . Macrobid [Nitrofurantoin Monohyd Macro] Nausea And Vomiting  . Sulfa Antibiotics Nausea Only  . Tramadol Itching    Seeing things   Family History  Problem Relation Age of Onset  . Cancer Paternal Grandfather        Esophageal  . Coronary artery disease Mother   . Diabetes Mother   . Hypertension Mother   . Dementia Father   . Sudden death Brother        Suicide     Past medical history, social, surgical and family history all reviewed in electronic medical record.  No pertanent information unless stated regarding to the chief complaint.   Review of Systems:Review of systems updated and as accurate as of 05/07/17  No headache, visual changes, nausea, vomiting, diarrhea, constipation,  dizziness, abdominal pain, skin rash, fevers, chills, night sweats, weight loss, swollen lymph nodes, body aches, joint swelling, muscle aches, chest pain, shortness of breath, mood changes.   Objective  There were no vitals taken for this visit. Systems examined below as of 05/07/17   General: No apparent distress alert and oriented x3 mood and affect normal, dressed appropriately.  HEENT: Pupils equal, extraocular movements intact  Respiratory: Patient's speak in full sentences and does not appear short of breath  Cardiovascular: No lower extremity edema, non tender, no erythema  Skin: Warm dry intact with no signs of infection or rash on extremities or on axial skeleton.  Abdomen: Soft nontender  Neuro: Cranial nerves II through XII are intact, neurovascularly intact in all extremities with 2+ DTRs and 2+ pulses.  Lymph: No lymphadenopathy of posterior or anterior cervical chain or axillae bilaterally.  Gait normal with good balance and coordination.  MSK:  Non tender with full range of motion and good stability and symmetric strength and tone of shoulders, elbows, wrist, hip, knee and ankles bilaterally.     Impression and Recommendations:     This case required medical decision making of moderate complexity.      Note: This dictation was prepared with Dragon dictation along with smaller phrase technology. Any transcriptional errors that result from this process are unintentional.

## 2017-05-08 ENCOUNTER — Ambulatory Visit (INDEPENDENT_AMBULATORY_CARE_PROVIDER_SITE_OTHER): Payer: 59 | Admitting: Internal Medicine

## 2017-05-08 ENCOUNTER — Encounter: Payer: Self-pay | Admitting: Internal Medicine

## 2017-05-08 ENCOUNTER — Telehealth: Payer: Self-pay | Admitting: Adult Health

## 2017-05-08 VITALS — BP 122/64 | HR 55 | Ht 61.0 in | Wt 184.8 lb

## 2017-05-08 DIAGNOSIS — I10 Essential (primary) hypertension: Secondary | ICD-10-CM | POA: Diagnosis not present

## 2017-05-08 DIAGNOSIS — R0602 Shortness of breath: Secondary | ICD-10-CM | POA: Diagnosis not present

## 2017-05-08 DIAGNOSIS — E782 Mixed hyperlipidemia: Secondary | ICD-10-CM

## 2017-05-08 DIAGNOSIS — I2510 Atherosclerotic heart disease of native coronary artery without angina pectoris: Secondary | ICD-10-CM

## 2017-05-08 LAB — BASIC METABOLIC PANEL
BUN / CREAT RATIO: 12 (ref 12–28)
BUN: 11 mg/dL (ref 8–27)
CHLORIDE: 99 mmol/L (ref 96–106)
CO2: 20 mmol/L (ref 20–29)
Calcium: 9.8 mg/dL (ref 8.7–10.3)
Creatinine, Ser: 0.9 mg/dL (ref 0.57–1.00)
GFR calc Af Amer: 74 mL/min/{1.73_m2} (ref >59)
GFR calc non Af Amer: 65 mL/min/{1.73_m2} (ref >59)
GLUCOSE: 82 mg/dL (ref 65–99)
POTASSIUM: 4.5 mmol/L (ref 3.5–5.2)
SODIUM: 135 mmol/L (ref 134–144)

## 2017-05-08 LAB — CBC
Hematocrit: 37.2 % (ref 34.0–46.6)
Hemoglobin: 12.6 g/dL (ref 11.1–15.9)
MCH: 30.6 pg (ref 26.6–33.0)
MCHC: 33.9 g/dL (ref 31.5–35.7)
MCV: 90 fL (ref 79–97)
PLATELETS: 228 10*3/uL (ref 150–379)
RBC: 4.12 x10E6/uL (ref 3.77–5.28)
RDW: 13.2 % (ref 12.3–15.4)
WBC: 7.5 10*3/uL (ref 3.4–10.8)

## 2017-05-08 LAB — PRO B NATRIURETIC PEPTIDE: NT-Pro BNP: 226 pg/mL (ref 0–301)

## 2017-05-08 MED ORDER — FAMOTIDINE 20 MG PO TABS: 20.0000 mg | ORAL_TABLET | Freq: Every day | ORAL | Status: DC

## 2017-05-08 MED ORDER — FLUTICASONE PROPIONATE HFA 220 MCG/ACT IN AERO: 1.0000 | INHALATION_SPRAY | Freq: Two times a day (BID) | RESPIRATORY_TRACT | 3 refills | Status: DC

## 2017-05-08 NOTE — Patient Instructions (Addendum)
Your physician has recommended you make the following change in your medication:  1.) START PEPCID (famotidine) 20 mg every day 1-2 hours before bedtime 2.) START FLOVENT  1 puff two times a day  Your physician recommends that you return for lab work today (CBC, BMET, BNP)  We will call you with your lab results.

## 2017-05-08 NOTE — Telephone Encounter (Signed)
Pt request refill   zolpidem (AMBIEN) 5 MG tablet  [id number] - [place], [place] - [address]

## 2017-05-09 ENCOUNTER — Other Ambulatory Visit: Payer: Self-pay | Admitting: Adult Health

## 2017-05-09 DIAGNOSIS — G4700 Insomnia, unspecified: Secondary | ICD-10-CM

## 2017-05-11 ENCOUNTER — Telehealth: Payer: Self-pay

## 2017-05-11 ENCOUNTER — Ambulatory Visit: Payer: 59

## 2017-05-11 NOTE — Telephone Encounter (Signed)
Patient came to office today stating she has been feeling tired lately so she wanted a B12 injection. Patient confirmed she has not had an injection since May 2018 as she has been "too busy". The last B12 level she had drawn was in range. I advised her that I would notify her PCP and we would follow up. She verbalized understanding

## 2017-05-12 ENCOUNTER — Other Ambulatory Visit: Payer: Self-pay | Admitting: Emergency Medicine

## 2017-05-12 DIAGNOSIS — G4700 Insomnia, unspecified: Secondary | ICD-10-CM

## 2017-05-12 MED ORDER — ZOLPIDEM TARTRATE 5 MG PO TABS: 5.0000 mg | ORAL_TABLET | Freq: Every day | ORAL | 0 refills | Status: DC

## 2017-05-12 NOTE — Telephone Encounter (Signed)
Ok to refill for 30 days  

## 2017-05-12 NOTE — Telephone Encounter (Signed)
Last filled 04/07/17, #30 Last seen 03/31/17 Please advise Tommi Rumps if okay to refill. Thanks.

## 2017-05-12 NOTE — Telephone Encounter (Signed)
Rx has been sent to the pharmacy

## 2017-05-12 NOTE — Telephone Encounter (Signed)
Spoke with patient and scheduled her for Friday morning for her B12 injection. She is also requesting a refill on her Ambien.

## 2017-05-12 NOTE — Telephone Encounter (Signed)
Ok to give B!2 injection

## 2017-05-13 ENCOUNTER — Ambulatory Visit: Payer: Self-pay | Admitting: Obstetrics and Gynecology

## 2017-05-15 ENCOUNTER — Ambulatory Visit: Payer: 59 | Admitting: Gastroenterology

## 2017-05-15 ENCOUNTER — Ambulatory Visit (INDEPENDENT_AMBULATORY_CARE_PROVIDER_SITE_OTHER): Payer: 59 | Admitting: *Deleted

## 2017-05-15 DIAGNOSIS — Z23 Encounter for immunization: Secondary | ICD-10-CM

## 2017-05-15 DIAGNOSIS — E538 Deficiency of other specified B group vitamins: Secondary | ICD-10-CM | POA: Diagnosis not present

## 2017-05-15 MED ORDER — CYANOCOBALAMIN 1000 MCG/ML IJ SOLN
1000.0000 ug | Freq: Once | INTRAMUSCULAR | Status: AC
  Administered 2017-05-15: 1000 ug via INTRAMUSCULAR

## 2017-05-15 NOTE — Progress Notes (Signed)
Patient here for B12 injection and flu shot. Patient tolerated injections well.  Dorrene German, RN

## 2017-05-19 ENCOUNTER — Ambulatory Visit (INDEPENDENT_AMBULATORY_CARE_PROVIDER_SITE_OTHER): Payer: 59 | Admitting: Obstetrics and Gynecology

## 2017-05-19 ENCOUNTER — Encounter: Payer: Self-pay | Admitting: Obstetrics and Gynecology

## 2017-05-19 VITALS — BP 118/60 | HR 68 | Resp 20 | Wt 183.0 lb

## 2017-05-19 DIAGNOSIS — R1031 Right lower quadrant pain: Secondary | ICD-10-CM

## 2017-05-19 NOTE — Progress Notes (Signed)
GYNECOLOGY  VISIT   HPI: 71 y.o.   Married  Caucasian  female   G2P2 with No LMP recorded (lmp unknown). Patient is postmenopausal.   here for follow up RLQ pain and pelvic fullness. She still has her pain, RLQ, radiates to the LLQ. She declines gyn ultrasound. No urinary frequency, urgency or pain. No diarrhea or constipation.  Currently BM every 1-2 days (not a change). No vaginal bleeding.     The pain has been daily for the last 2.5 months, constant. The RLQ pain is sharp, intermittently, cramp in BLQ. Her pain ranges from a 6-7/10 in severity.  She had a normal colonoscopy in 2011. Normal abdominal ultrasound in 5/18. Pelvic organs not seen. She is f/u with GU for evaluation of her pain, not having any bladder symptoms. No hematuria in 6/18. She is also supposed to see GI.    GYNECOLOGIC HISTORY: No LMP recorded (lmp unknown). Patient is postmenopausal. Contraception:postmenopause   Menopausal hormone therapy: Premarin         OB History    Gravida Para Term Preterm AB Living   2 2       2    SAB TAB Ectopic Multiple Live Births                     Patient Active Problem List   Diagnosis Date Noted  . RAD (reactive airway disease) 04/01/2016  . Dyspnea 02/20/2016  . Bradycardia 01/27/2016  . B12 deficiency 07/11/2015  . Anemia associated with acute blood loss 05/28/2014  . Cigarette smoker 05/28/2014  . OA (osteoarthritis) of hip 05/24/2014  . Chest pain 12/19/2013  . History of non-ST elevation myocardial infarction (NSTEMI) 05/18/2012  . Allergic rhinitis due to pollen 04/01/2012  . Back pain 11/22/2010  . NEOPLASM OF UNCERTAIN BEHAVIOR OF SKIN 06/05/2010  . Swallowing difficulty 06/04/2010  . GERD 07/30/2009  . Hyperlipidemia 03/02/2007  . Depression 03/02/2007  . Coronary artery disease involving native heart without angina pectoris 03/02/2007    Past Medical History:  Diagnosis Date  . Arthritis   . Blood transfusion without reported diagnosis   . CAD  (coronary artery disease)    a. 1997 MI/PCI RCA;  b. 2007 MI/PCI of 100% RCA with Taxus DES x 3 placed, EF 55%;  c. 2010 Cath: stable anatomy;  d. 05/2012 NSTEMI/Cath/PCI: LM nl, LAD 60-34m, D1 20ost, LCX 78m, RCA 40-56m ISR, 60/95d (Treated w/ 2.75x33 Xience Xpedition DES), PDA 79m (Treated w/ PTCA), EF 60%.  . Carpal tunnel syndrome on both sides   . Chronic lower back pain   . Depression   . GERD (gastroesophageal reflux disease)   . Hyperlipidemia   . Myocardial infarction [facility]) 1997, 2007, 2013  . Numbness of foot    Left foot - back surgery 2009    Past Surgical History:  Procedure Laterality Date  . ANKLE SURGERY  Years ago   Left; "had to take out a floater"  . CARPAL TUNNEL RELEASE Left 10/12016  . CESAREAN SECTION  1990  . CORONARY ANGIOPLASTY WITH STENT PLACEMENT  1997; 2007, 2013   "2 + 2; + 1= total of 5  . LEFT HEART CATHETERIZATION WITH CORONARY ANGIOGRAM N/A 05/18/2012   Procedure: LEFT HEART CATHETERIZATION WITH CORONARY ANGIOGRAM;  Surgeon: Wellington Hampshire, MD;  Location: [place] CATH LAB;  Service: Cardiovascular;  Laterality: N/A;  . [place]; 2009  . TOTAL HIP ARTHROPLASTY Right 05/24/2014   Procedure: RIGHT TOTAL HIP ARTHROPLASTY  ANTERIOR APPROACH;  Surgeon: Gearlean Alf, MD;  Location: [facility];  Service: Orthopedics;  Laterality: Right;    Current Outpatient Prescriptions  Medication Sig Dispense Refill  . buPROPion (WELLBUTRIN XL) 150 MG 24 hr tablet TAKE 1 TABLET BY MOUTH EVERY DAY IN THE MORNING  1  . clopidogrel (PLAVIX) 75 MG tablet Take 1 tablet (75 mg total) by mouth daily. 90 tablet 3  . dexlansoprazole (DEXILANT) 60 MG capsule Take 1 capsule (60 mg total) by mouth daily. 30 capsule 2  . escitalopram (LEXAPRO) 20 MG tablet Take 20 mg by mouth daily.  1  . ezetimibe (ZETIA) 10 MG tablet Take 1 tablet (10 mg total) by mouth daily. 90 tablet 3  . famotidine (PEPCID) 20 MG tablet Take 1 tablet (20 mg total) by mouth at bedtime. 1-2  hours before bed    . fluticasone (FLONASE) 50 MCG/ACT nasal spray Place 2 sprays into both nostrils daily as needed for allergies or rhinitis.    . fluticasone (FLOVENT HFA) 220 MCG/ACT inhaler Inhale 1 puff into the lungs 2 (two) times daily. 1 Inhaler 3  . meclizine (ANTIVERT) 25 MG tablet Take 1 tablet (25 mg total) by mouth 2 (two) times daily. 30 tablet 0  . Methen-Hyosc-Meth Blue-Na Phos (ME/NAPHOS/MB/HYO1 PO) Take 1 tablet by mouth every 6 (six) hours as needed.    . metoprolol (LOPRESSOR) 50 MG tablet TAKE 1/2 TABLETS (25 MG TOTAL) BY MOUTH 2 (TWO) TIMES DAILY. 90 tablet 0  . NITROSTAT 0.4 MG SL tablet PLACE 1 TABLET (0.4 MG TOTAL) UNDER THE TONGUE EVERY 5 (FIVE) MINUTES AS NEEDED FOR CHEST PAIN. 25 tablet 0  . pentosan polysulfate (ELMIRON) 100 MG capsule Take 200 mg by mouth 2 (two) times daily.    Marland Kitchen PREMARIN vaginal cream USE 1/2 GRAM VAGINALLY EVERY NIGHT AT BEDTIME FOR THE 1ST 2 WEEKS,, THEN 1/2 GRAM 2-3 TIMES A WEEK 30 g 1  . Probiotic Product (PROBIOTIC PO) Take 1 capsule by mouth daily. Nature Bounty's brand    . simvastatin (ZOCOR) 80 MG tablet TAKE 1 TABLET BY MOUTH AT BEDTIME 30 tablet 5  . UNABLE TO FIND Inject 1 mL into the skin every 30 (thirty) days. b12 shots twice a month at MDs office    . zolpidem (AMBIEN) 5 MG tablet Take 1 tablet (5 mg total) by mouth at bedtime. 30 tablet 0  . gabapentin (NEURONTIN) 300 MG capsule     . hydrOXYzine (ATARAX/VISTARIL) 25 MG tablet TAKE 1 TO 2 TABLETS BY MOUTH ONE HOUR BEFORE SLEEP  99   No current facility-administered medications for this visit.      ALLERGIES: Oxycodone hcl; Penicillins; Aspirin; Macrobid [nitrofurantoin monohyd macro]; Sulfa antibiotics; and Tramadol  Family History  Problem Relation Age of Onset  . Cancer Paternal Grandfather        Esophageal  . Coronary artery disease Mother   . Diabetes Mother   . Hypertension Mother   . Dementia Father   . Sudden death Brother        Suicide    Social History    Social History  . Marital status: Married    Spouse name: N/A  . Number of children: N/A  . Years of education: N/A   Occupational History  . Not on file.   Social History Main Topics  . Smoking status: Light Tobacco Smoker    Packs/day: 0.50    Years: 50.00    Types: Cigarettes  . Smokeless tobacco: Never Used  Comment: smokes a half pack a day  . Alcohol use No  . Drug use: No  . Sexual activity: Not Currently    Birth control/ protection: Post-menopausal   Other Topics Concern  . Not on file   Social History Narrative   Is a homemaker   Married for 48 years    Has a daughter and son    Pets: Two dogs and a Neurosurgeon   Likes to garden ( flower beds).           Review of Systems  Constitutional: Negative.   HENT: Negative.   Eyes: Negative.   Respiratory: Negative.   Cardiovascular: Negative.   Gastrointestinal: Negative.   Genitourinary:       RLQ pain  Musculoskeletal: Negative.   Skin: Negative.   Neurological: Negative.   Endo/Heme/Allergies: Negative.   Psychiatric/Behavioral: Negative.     PHYSICAL EXAMINATION:    BP 118/60 (BP Location: Right Arm, Patient Position: Sitting, Cuff Size: Normal)   Pulse 68   Resp 20   Wt 183 lb (83 kg)   LMP  (LMP Unknown)   BMI 34.58 kg/m     General appearance: alert, cooperative and appears stated age Abdomen: soft, non-tender; non distended, no masses,  no organomegaly  Pelvic: External genitalia:  no lesions              Urethra:  normal appearing urethra with no masses, tenderness or lesions              Bartholins and Skenes: normal                 Cervix: no cervical motion tenderness              Bimanual Exam:  Uterus:  normal size, contour, position, consistency, mobility, non-tender              Adnexa: no masses, mildly tender right adnexa              Rectovaginal: Yes.  .  Confirms.              Anus:  normal sphincter tone, no lesions  Chaperone was present for exam.  ASSESSMENT RLQ  abdominal pain, now with some radiation to the left. She denies bladder symptoms. Some recent improvement in her BM's. Prior fullness on exam has resolved    PLAN Recommend pelvic ultrasound She has an appointment to see Urology and will f/u with GI. If the etiology of her pain isn't determined by these MD's, then she will return for an ultrasound She will call when she is ready to schedule.    An After Visit Summary was printed and given to the patient.  10+ minutes face to face time of which over 50% was spent in counseling.

## 2017-05-25 ENCOUNTER — Inpatient Hospital Stay (HOSPITAL_COMMUNITY)
Admission: EM | Admit: 2017-05-25 | Discharge: 2017-06-05 | DRG: 234 | Disposition: A | Discharge status: Home Health | Payer: 59 | Attending: Cardiothoracic Surgery | Admitting: Cardiothoracic Surgery

## 2017-05-25 ENCOUNTER — Encounter (HOSPITAL_COMMUNITY): Payer: Self-pay | Admitting: Emergency Medicine

## 2017-05-25 ENCOUNTER — Emergency Department (HOSPITAL_COMMUNITY): Payer: 59

## 2017-05-25 DIAGNOSIS — Z96641 Presence of right artificial hip joint: Secondary | ICD-10-CM | POA: Diagnosis present

## 2017-05-25 DIAGNOSIS — T82855A Stenosis of coronary artery stent, initial encounter: Secondary | ICD-10-CM | POA: Diagnosis present

## 2017-05-25 DIAGNOSIS — Z981 Arthrodesis status: Secondary | ICD-10-CM

## 2017-05-25 DIAGNOSIS — Z800 Family history of malignant neoplasm of digestive organs: Secondary | ICD-10-CM | POA: Diagnosis not present

## 2017-05-25 DIAGNOSIS — R079 Chest pain, unspecified: Secondary | ICD-10-CM | POA: Diagnosis present

## 2017-05-25 DIAGNOSIS — I25110 Atherosclerotic heart disease of native coronary artery with unstable angina pectoris: Secondary | ICD-10-CM | POA: Diagnosis not present

## 2017-05-25 DIAGNOSIS — E782 Mixed hyperlipidemia: Secondary | ICD-10-CM | POA: Diagnosis not present

## 2017-05-25 DIAGNOSIS — R112 Nausea with vomiting, unspecified: Secondary | ICD-10-CM | POA: Diagnosis not present

## 2017-05-25 DIAGNOSIS — E785 Hyperlipidemia, unspecified: Secondary | ICD-10-CM | POA: Diagnosis present

## 2017-05-25 DIAGNOSIS — M1990 Unspecified osteoarthritis, unspecified site: Secondary | ICD-10-CM | POA: Diagnosis present

## 2017-05-25 DIAGNOSIS — Z951 Presence of aortocoronary bypass graft: Secondary | ICD-10-CM

## 2017-05-25 DIAGNOSIS — I200 Unstable angina: Secondary | ICD-10-CM | POA: Diagnosis not present

## 2017-05-25 DIAGNOSIS — Z6834 Body mass index (BMI) 34.0-34.9, adult: Secondary | ICD-10-CM

## 2017-05-25 DIAGNOSIS — I340 Nonrheumatic mitral (valve) insufficiency: Secondary | ICD-10-CM | POA: Diagnosis present

## 2017-05-25 DIAGNOSIS — M545 Low back pain: Secondary | ICD-10-CM | POA: Diagnosis present

## 2017-05-25 DIAGNOSIS — I252 Old myocardial infarction: Secondary | ICD-10-CM

## 2017-05-25 DIAGNOSIS — Z09 Encounter for follow-up examination after completed treatment for conditions other than malignant neoplasm: Secondary | ICD-10-CM

## 2017-05-25 DIAGNOSIS — N179 Acute kidney failure, unspecified: Secondary | ICD-10-CM | POA: Diagnosis not present

## 2017-05-25 DIAGNOSIS — Z7902 Long term (current) use of antithrombotics/antiplatelets: Secondary | ICD-10-CM | POA: Diagnosis not present

## 2017-05-25 DIAGNOSIS — Z886 Allergy status to analgesic agent status: Secondary | ICD-10-CM

## 2017-05-25 DIAGNOSIS — J9811 Atelectasis: Secondary | ICD-10-CM | POA: Diagnosis not present

## 2017-05-25 DIAGNOSIS — F329 Major depressive disorder, single episode, unspecified: Secondary | ICD-10-CM | POA: Diagnosis present

## 2017-05-25 DIAGNOSIS — E873 Alkalosis: Secondary | ICD-10-CM | POA: Diagnosis not present

## 2017-05-25 DIAGNOSIS — I10 Essential (primary) hypertension: Secondary | ICD-10-CM | POA: Diagnosis not present

## 2017-05-25 DIAGNOSIS — D62 Acute posthemorrhagic anemia: Secondary | ICD-10-CM | POA: Diagnosis not present

## 2017-05-25 DIAGNOSIS — F17210 Nicotine dependence, cigarettes, uncomplicated: Secondary | ICD-10-CM | POA: Diagnosis present

## 2017-05-25 DIAGNOSIS — Z882 Allergy status to sulfonamides status: Secondary | ICD-10-CM

## 2017-05-25 DIAGNOSIS — Z881 Allergy status to other antibiotic agents status: Secondary | ICD-10-CM

## 2017-05-25 DIAGNOSIS — E8770 Fluid overload, unspecified: Secondary | ICD-10-CM | POA: Diagnosis not present

## 2017-05-25 DIAGNOSIS — I480 Paroxysmal atrial fibrillation: Secondary | ICD-10-CM | POA: Diagnosis not present

## 2017-05-25 DIAGNOSIS — Y838 Other surgical procedures as the cause of abnormal reaction of the patient, or of later complication, without mention of misadventure at the time of the procedure: Secondary | ICD-10-CM | POA: Diagnosis present

## 2017-05-25 DIAGNOSIS — Z955 Presence of coronary angioplasty implant and graft: Secondary | ICD-10-CM

## 2017-05-25 DIAGNOSIS — Z880 Allergy status to penicillin: Secondary | ICD-10-CM

## 2017-05-25 DIAGNOSIS — Z9689 Presence of other specified functional implants: Secondary | ICD-10-CM

## 2017-05-25 DIAGNOSIS — K219 Gastro-esophageal reflux disease without esophagitis: Secondary | ICD-10-CM | POA: Diagnosis present

## 2017-05-25 DIAGNOSIS — I214 Non-ST elevation (NSTEMI) myocardial infarction: Principal | ICD-10-CM | POA: Diagnosis present

## 2017-05-25 DIAGNOSIS — Z885 Allergy status to narcotic agent status: Secondary | ICD-10-CM

## 2017-05-25 DIAGNOSIS — I25119 Atherosclerotic heart disease of native coronary artery with unspecified angina pectoris: Secondary | ICD-10-CM

## 2017-05-25 DIAGNOSIS — Z888 Allergy status to other drugs, medicaments and biological substances status: Secondary | ICD-10-CM

## 2017-05-25 DIAGNOSIS — Z8249 Family history of ischemic heart disease and other diseases of the circulatory system: Secondary | ICD-10-CM

## 2017-05-25 DIAGNOSIS — G8929 Other chronic pain: Secondary | ICD-10-CM | POA: Diagnosis present

## 2017-05-25 LAB — CBC
HCT: 36.9 % (ref 36.0–46.0)
Hemoglobin: 12.3 g/dL (ref 12.0–15.0)
MCH: 30.2 pg (ref 26.0–34.0)
MCHC: 33.3 g/dL (ref 30.0–36.0)
MCV: 90.7 fL (ref 78.0–100.0)
PLATELETS: 217 10*3/uL (ref 150–400)
RBC: 4.07 MIL/uL (ref 3.87–5.11)
RDW: 12.1 % (ref 11.5–15.5)
WBC: 7 10*3/uL (ref 4.0–10.5)

## 2017-05-25 LAB — BASIC METABOLIC PANEL
ANION GAP: 10 (ref 5–15)
BUN: 8 mg/dL (ref 6–20)
CALCIUM: 9.5 mg/dL (ref 8.9–10.3)
CO2: 26 mmol/L (ref 22–32)
CREATININE: 0.86 mg/dL (ref 0.44–1.00)
Chloride: 101 mmol/L (ref 101–111)
GLUCOSE: 112 mg/dL — AB (ref 65–99)
Potassium: 4.1 mmol/L (ref 3.5–5.1)
Sodium: 137 mmol/L (ref 135–145)

## 2017-05-25 LAB — I-STAT TROPONIN, ED
TROPONIN I, POC: 0.05 ng/mL (ref 0.00–0.08)
Troponin i, poc: 0.08 ng/mL (ref 0.00–0.08)

## 2017-05-25 LAB — D-DIMER, QUANTITATIVE (NOT AT ARMC): D DIMER QUANT: 0.79 ug{FEU}/mL — AB (ref 0.00–0.50)

## 2017-05-25 MED ORDER — HEPARIN (PORCINE) IN NACL 100-0.45 UNIT/ML-% IJ SOLN
800.0000 [IU]/h | INTRAMUSCULAR | Status: DC
  Administered 2017-05-25: 800 [IU]/h via INTRAVENOUS
  Filled 2017-05-25: qty 250

## 2017-05-25 MED ORDER — HEPARIN BOLUS VIA INFUSION
4000.0000 [IU] | Freq: Once | INTRAVENOUS | Status: AC
  Administered 2017-05-25: 4000 [IU] via INTRAVENOUS
  Filled 2017-05-25: qty 4000

## 2017-05-25 MED ORDER — IOPAMIDOL (ISOVUE-370) INJECTION 76%
INTRAVENOUS | Status: AC
  Administered 2017-05-25: 100 mL
  Filled 2017-05-25: qty 100

## 2017-05-25 MED ORDER — SODIUM CHLORIDE 0.9 % IV SOLN
INTRAVENOUS | Status: DC
  Administered 2017-05-25: 125 mL/h via INTRAVENOUS

## 2017-05-25 MED ORDER — FENTANYL CITRATE (PF) 100 MCG/2ML IJ SOLN
50.0000 ug | Freq: Once | INTRAMUSCULAR | Status: AC
  Administered 2017-05-25: 50 ug via INTRAVENOUS
  Filled 2017-05-25: qty 2

## 2017-05-25 MED ORDER — MORPHINE SULFATE (PF) 4 MG/ML IV SOLN
2.0000 mg | INTRAVENOUS | Status: DC | PRN
  Administered 2017-05-25 – 2017-05-26 (×3): 2 mg via INTRAVENOUS
  Filled 2017-05-25 (×4): qty 1

## 2017-05-25 MED ORDER — NITROGLYCERIN IN D5W 200-5 MCG/ML-% IV SOLN
0.0000 ug/min | INTRAVENOUS | Status: DC
Start: 2017-05-25 — End: 2017-05-26
  Administered 2017-05-25: 5 ug/min via INTRAVENOUS
  Filled 2017-05-25: qty 250

## 2017-05-25 MED ORDER — ACETAMINOPHEN 325 MG PO TABS: 650.0000 mg | ORAL_TABLET | Freq: Four times a day (QID) | ORAL | Status: DC | PRN

## 2017-05-25 MED ORDER — GI COCKTAIL ~~LOC~~
30.0000 mL | Freq: Once | ORAL | Status: AC
  Administered 2017-05-25: 30 mL via ORAL
  Filled 2017-05-25: qty 30

## 2017-05-25 NOTE — ED Provider Notes (Signed)
[facility] DEPT Provider Note   CSN: [id number] Arrival date & time: 05/25/17  1607     History   Chief Complaint Chief Complaint  Patient presents with  . Chest Pain    HPI Bianca [name] is a 71 y.o. female.  HPI Bilateral chest pain started 3 hours ago.  It is a burning and pressure pain.  It radiates toward her neck.  She  Took ntg at home and EMS have her NTG.  The sx have resolved now.  This feels similar to her prior anginal sx.  She has a history of CAD.  She has had some sx recently.  She had a stress test a few months ago that was normal.  SHe denies any other sx. Past Medical History:  Diagnosis Date  . Arthritis   . Blood transfusion without reported diagnosis   . CAD (coronary artery disease)    a. 1997 MI/PCI RCA;  b. 2007 MI/PCI of 100% RCA with Taxus DES x 3 placed, EF 55%;  c. 2010 Cath: stable anatomy;  d. 05/2012 NSTEMI/Cath/PCI: LM nl, LAD 60-52m, D1 20ost, LCX 71m, RCA 40-37m ISR, 60/95d (Treated w/ 2.75x33 Xience Xpedition DES), PDA 33m (Treated w/ PTCA), EF 60%.  . Carpal tunnel syndrome on both sides   . Chronic lower back pain   . Depression   . GERD (gastroesophageal reflux disease)   . Hyperlipidemia   . Myocardial infarction [facility]) 1997, 2007, 2013  . Numbness of foot    Left foot - back surgery 2009    Patient Active Problem List   Diagnosis Date Noted  . RAD (reactive airway disease) 04/01/2016  . Dyspnea 02/20/2016  . Bradycardia 01/27/2016  . B12 deficiency 07/11/2015  . Anemia associated with acute blood loss 05/28/2014  . Cigarette smoker 05/28/2014  . OA (osteoarthritis) of hip 05/24/2014  . Chest pain 12/19/2013  . History of non-ST elevation myocardial infarction (NSTEMI) 05/18/2012  . Allergic rhinitis due to pollen 04/01/2012  . Back pain 11/22/2010  . NEOPLASM OF UNCERTAIN BEHAVIOR OF SKIN 06/05/2010  . Swallowing difficulty 06/04/2010  . GERD 07/30/2009  . Hyperlipidemia 03/02/2007  . Depression 03/02/2007  . Coronary  artery disease involving native heart without angina pectoris 03/02/2007    Past Surgical History:  Procedure Laterality Date  . ANKLE SURGERY  Years ago   Left; "had to take out a floater"  . CARPAL TUNNEL RELEASE Left 10/12016  . CESAREAN SECTION  1990  . CORONARY ANGIOPLASTY WITH STENT PLACEMENT  1997; 2007, 2013   "2 + 2; + 1= total of 5  . LEFT HEART CATHETERIZATION WITH CORONARY ANGIOGRAM N/A 05/18/2012   Procedure: LEFT HEART CATHETERIZATION WITH CORONARY ANGIOGRAM;  Surgeon: Wellington Hampshire, MD;  Location: [facility] CATH LAB;  Service: Cardiovascular;  Laterality: N/A;  . [place]; 2009  . TOTAL HIP ARTHROPLASTY Right 05/24/2014   Procedure: RIGHT TOTAL HIP ARTHROPLASTY ANTERIOR APPROACH;  Surgeon: Gearlean Alf, MD;  Location: [facility] ORS;  Service: Orthopedics;  Laterality: Right;    OB History    Gravida Para Term Preterm AB Living   2 2       2    SAB TAB Ectopic Multiple Live Births                   Home Medications    Prior to Admission medications   Medication Sig Start Date End Date Taking? Authorizing Provider  buPROPion (WELLBUTRIN XL) 150 MG 24 hr tablet  TAKE 1 TABLET BY MOUTH EVERY DAY IN THE MORNING 11/11/16   [provider]  clopidogrel (PLAVIX) 75 MG tablet Take 1 tablet (75 mg total) by mouth daily. 05/21/16   Richardson Dopp T, PA-C  dexlansoprazole (DEXILANT) 60 MG capsule Take 1 capsule (60 mg total) by mouth daily. 01/06/17   Levin Erp, PA  escitalopram (LEXAPRO) 20 MG tablet Take 20 mg by mouth daily. 01/14/17   [provider]  ezetimibe (ZETIA) 10 MG tablet Take 1 tablet (10 mg total) by mouth daily. 12/29/16 05/19/17  Leanor Kail, PA  famotidine (PEPCID) 20 MG tablet Take 1 tablet (20 mg total) by mouth at bedtime. 1-2 hours before bed 05/08/17   Fay Records, MD  fluticasone [facility]) 50 MCG/ACT nasal spray Place 2 sprays into both nostrils daily as needed for allergies or rhinitis.    [provider]  fluticasone (FLOVENT HFA) 220 MCG/ACT inhaler Inhale 1 puff into the lungs 2 (two) times daily. 05/08/17   Fay Records, MD  gabapentin (NEURONTIN) 300 MG capsule  05/18/17   [provider]  hydrOXYzine (ATARAX/VISTARIL) 25 MG tablet TAKE 1 TO 2 TABLETS BY MOUTH ONE HOUR BEFORE SLEEP 04/09/17   [provider]  meclizine (ANTIVERT) 25 MG tablet Take 1 tablet (25 mg total) by mouth 2 (two) times daily. 03/31/17   Nafziger, Tommi Rumps, NP  Methen-Hyosc-Meth Ree Kida Phos (ME/NAPHOS/MB/HYO1 PO) Take 1 tablet by mouth every 6 (six) hours as needed.    [provider]  metoprolol (LOPRESSOR) 50 MG tablet TAKE 1/2 TABLETS (25 MG TOTAL) BY MOUTH 2 (TWO) TIMES DAILY. 05/21/16   Weaver, Scott T, PA-C  NITROSTAT 0.4 MG SL tablet PLACE 1 TABLET (0.4 MG TOTAL) UNDER THE TONGUE EVERY 5 (FIVE) MINUTES AS NEEDED FOR CHEST PAIN. 08/20/16   Fay Records, MD  pentosan polysulfate (ELMIRON) 100 MG capsule Take 200 mg by mouth 2 (two) times daily.    [provider]  PREMARIN vaginal cream USE 1/2 GRAM VAGINALLY EVERY NIGHT AT BEDTIME FOR THE 1ST 2 WEEKS,, THEN 1/2 GRAM 2-3 TIMES A WEEK 12/10/16   Regina Eck, CNM  Probiotic Product (PROBIOTIC PO) Take 1 capsule by mouth daily. Nature Bounty's brand    [provider]  simvastatin (ZOCOR) 80 MG tablet TAKE 1 TABLET BY MOUTH AT BEDTIME 12/02/16   Nafziger, Tommi Rumps, NP  UNABLE TO FIND Inject 1 mL into the skin every 30 (thirty) days. b12 shots twice a month at MDs office    [provider]  zolpidem (AMBIEN) 5 MG tablet Take 1 tablet (5 mg total) by mouth at bedtime. 05/12/17   [name], NP    Family History Family History  Problem Relation Age of Onset  . Cancer Paternal Grandfather        Esophageal  . Coronary artery disease Mother   . Diabetes Mother   . Hypertension Mother   . Dementia Father   . Sudden death Brother        Suicide    Social History Social History  Substance Use Topics    . Smoking status: Light Tobacco Smoker    Packs/day: 0.50    Years: 50.00    Types: Cigarettes  . Smokeless tobacco: Never Used     Comment: smokes a half pack a day  . Alcohol use No     Allergies   Oxycodone hcl; Penicillins; Aspirin; Macrobid [nitrofurantoin monohyd macro]; Sulfa antibiotics; and Tramadol   Review of Systems  Review of Systems  Constitutional: Negative for fever.  Respiratory: Positive for shortness of breath. Negative for cough.   All other systems reviewed and are negative.    Physical Exam Updated Vital Signs BP 120/83 (BP Location: Right Arm)   Pulse (!) 59   Temp 98.2 F (36.8 C) (Oral)   Resp 20   Ht 1.549 m (5\' 1" )   Wt 83 kg (183 lb)   LMP  (LMP Unknown)   SpO2 99%   BMI 34.58 kg/m   Physical Exam  Constitutional: She appears well-developed and well-nourished. No distress.  HENT:  Head: Normocephalic and atraumatic.  Right Ear: External ear normal.  Left Ear: External ear normal.  Eyes: Conjunctivae are normal. Right eye exhibits no discharge. Left eye exhibits no discharge. No scleral icterus.  Neck: Neck supple. No tracheal deviation present.  Cardiovascular: Normal rate, regular rhythm and intact distal pulses.   Pulmonary/Chest: Effort normal and breath sounds normal. No stridor. No respiratory distress. She has no wheezes. She has no rales.  Abdominal: Soft. Bowel sounds are normal. She exhibits no distension. There is no tenderness. There is no rebound and no guarding.  Musculoskeletal: She exhibits no edema or tenderness.  Neurological: She is alert. She has normal strength. No cranial nerve deficit (no facial droop, extraocular movements intact, no slurred speech) or sensory deficit. She exhibits normal muscle tone. She displays no seizure activity. Coordination normal.  Skin: Skin is warm and dry. No rash noted.  Psychiatric: She has a normal mood and affect.  Nursing note and vitals reviewed.    ED Treatments / Results   Labs (all labs ordered are listed, but only abnormal results are displayed) Labs Reviewed  BASIC METABOLIC PANEL - Abnormal; Notable for the following:       Result Value   Glucose, Bld 112 (*)    All other components within normal limits  D-DIMER, QUANTITATIVE (NOT AT [facility]) - Abnormal; Notable for the following:    D-Dimer, Quant 0.79 (*)    All other components within normal limits  CBC  I-STAT TROPONIN, ED    EKG  EKG Interpretation  Date/Time:  Monday May 25 2017 17:10:05 EDT Ventricular Rate:  63 PR Interval:    QRS Duration: 95 QT Interval:  405 QTC Calculation: 415 R Axis:   73 Text Interpretation:  Sinus rhythm Abnrm T, consider ischemia, anterolateral lds , new since last tracing Confirmed by Dorie Rank [phone]) on 05/25/2017 5:13:39 PM       Radiology Dg Chest 2 View  Result Date: 05/25/2017 CLINICAL DATA:  Chest pain. EXAM: CHEST  2 VIEW COMPARISON:  01/02/2017 FINDINGS: The cardiac silhouette, mediastinal and hilar contours are normal and stable. Stable coronary artery stent noted on the right. The lungs are clear. Stable mild eventration right hemidiaphragm. No pleural effusions. Stable cystic changes involving both humeral heads. IMPRESSION: No acute cardiopulmonary findings. Electronically Signed   By: Marijo Sanes M.D.   On: 05/25/2017 16:57    Procedures Procedures (including critical care time)  Medications Ordered in ED Medications  0.9 %  sodium chloride infusion (125 mL/hr Intravenous New Bag/Given 05/25/17 1654)  iopamidol (ISOVUE-370) 76 % injection (not administered)  nitroGLYCERIN 50 mg in dextrose 5 % 250 mL (0.2 mg/mL) infusion (not administered)  gi cocktail (Maalox,Lidocaine,Donnatal) (30 mLs Oral Given 05/25/17 1654)  fentaNYL (SUBLIMAZE) injection 50 mcg (50 mcg Intravenous Given 05/25/17 1654)     Initial Impression / Assessment and Plan / ED Course  I have  reviewed the triage vital signs and the nursing notes.  Pertinent labs &  imaging results that were available during my care of the patient were reviewed by me and considered in my medical decision making (see chart for details).  Clinical Course as of May 25 1830  [facility] May 25, 2017  1636 Initially stated her sx resolved.  After I walked out of the room, family informed us her pain had returned.  Will try GI cocktail consider her recent normal stress test and that is what helped her last visit for chest pain.  Pt certainly at risk for recurrent ACS.  Will monitor closely.  [RR]  1165 Previous records.   Admitted in 10/13 with a NSTEMI tx with DES to RCA and POBA to PDA.    [JK]  1716 Normal nuc med test 12/2016  [JK]  1716 D dimer slightly elevated.     [JK]    Clinical Course User Index [JK] Dorie Rank, MD   Pt had persistent pain in the ED.  Sx concerning for ACS.  D Dimer elevated.  CT scan ordered although pt has had elevated d dimers in the past.  Pt seen in the ED by Dr Meda Coffee, cardiology.  Plan on admission and further workup  Final Clinical Impressions(s) / ED Diagnoses   Final diagnoses:  Chest pain due to coronary artery disease      Dorie Rank, MD 05/25/17 [phone]

## 2017-05-25 NOTE — H&P (Signed)
Cardiology Admission History and Physical:   Patient ID: Bianca [name]; MRN: [id number]; DOB: 1946/02/11   Admission date: 05/25/2017  Primary Care Provider: [name], NP Primary Cardiologist: Dr. Harrington Challenger Primary Electrophysiologist:  n/a  Chief Complaint: Chest pain   Patient Profile:   Bianca [name] is a 71 y.o. female with a history of CAD s/p MI in 1997 with PCI to RCA, MI in 2007 s/p DES x 3 to RCA, MI in 2013 with angioplasty to right PDA and DES to distal RCA,  HTN, and HLD. Presented to the ED on 05/25/17 with chest pain.   History of Present Illness:   Bianca [name] is a 71 year old female with a past medical history of CAD s/p MI in 1997 with PCI to RCA, MI in 2007 s/p DES x 3 to RCA, HTN, and HLD. Presented to the ED on 05/25/17 with chest pain.   Last cath was in 2013 at which time she was noted to have a 60-70% LAD lesion, 50% mid circumflex lesion, multiple overlapping stents in the RCA. It was felt that the patient could ultimately require CABG with borderline significant disease in the LAD and LCx.   She recently had a Lexiscan stress test that showed no ischemia. This was done as the patient reported persistent SOB with activity. She recently saw Dr. Harrington Challenger and reported the same persistent SOB. She was given an inhaler.   Today, she was sitting in her home and developed right sided chest pain, that spanned across her chest to the left side and up her left jaw. Pain is intermittent in nature, associated with SOB. Denies associated nausea, diaphoresis. She took one SL Nitro that moderately relieved her pain. Of note, she is on life long Plavix therapy. She has not missed any doses of Plavix. Last LDL in 10/2016 was 129.   EKG shows NSR with some nonspecific anterolateral ST changes. Troponin 0.05.      Past Medical History:  Diagnosis Date  . Arthritis   . Blood transfusion without reported diagnosis   . CAD (coronary artery disease)    a. 1997 MI/PCI RCA;  b. 2007  MI/PCI of 100% RCA with Taxus DES x 3 placed, EF 55%;  c. 2010 Cath: stable anatomy;  d. 05/2012 NSTEMI/Cath/PCI: LM nl, LAD 60-36m, D1 20ost, LCX 54m, RCA 40-53m ISR, 60/95d (Treated w/ 2.75x33 Xience Xpedition DES), PDA 71m (Treated w/ PTCA), EF 60%.  . Carpal tunnel syndrome on both sides   . Chronic lower back pain   . Depression   . GERD (gastroesophageal reflux disease)   . Hyperlipidemia   . Myocardial infarction [facility]) 1997, 2007, 2013  . Numbness of foot    Left foot - back surgery 2009    Past Surgical History:  Procedure Laterality Date  . ANKLE SURGERY  Years ago   Left; "had to take out a floater"  . CARPAL TUNNEL RELEASE Left 10/12016  . CESAREAN SECTION  1990  . CORONARY ANGIOPLASTY WITH STENT PLACEMENT  1997; 2007, 2013   "2 + 2; + 1= total of 5  . LEFT HEART CATHETERIZATION WITH CORONARY ANGIOGRAM N/A 05/18/2012   Procedure: LEFT HEART CATHETERIZATION WITH CORONARY ANGIOGRAM;  Surgeon: Wellington Hampshire, MD;  Location: [place] CATH LAB;  Service: Cardiovascular;  Laterality: N/A;  . [place]; 2009  . TOTAL HIP ARTHROPLASTY Right 05/24/2014   Procedure: RIGHT TOTAL HIP ARTHROPLASTY ANTERIOR APPROACH;  Surgeon: Gearlean Alf, MD;  Location:  [facility] ORS;  Service: Orthopedics;  Laterality: Right;     Medications Prior to Admission: Prior to Admission medications   Medication Sig Start Date End Date Taking? Authorizing Provider  buPROPion (WELLBUTRIN XL) 150 MG 24 hr tablet TAKE 1 TABLET BY MOUTH EVERY DAY IN THE MORNING 11/11/16   [provider]  clopidogrel (PLAVIX) 75 MG tablet Take 1 tablet (75 mg total) by mouth daily. 05/21/16   Richardson Dopp T, PA-C  dexlansoprazole (DEXILANT) 60 MG capsule Take 1 capsule (60 mg total) by mouth daily. 01/06/17   Levin Erp, PA  escitalopram (LEXAPRO) 20 MG tablet Take 20 mg by mouth daily. 01/14/17   [provider]  ezetimibe (ZETIA) 10 MG tablet Take 1 tablet (10 mg total) by mouth  daily. 12/29/16 05/19/17  Leanor Kail, PA  famotidine (PEPCID) 20 MG tablet Take 1 tablet (20 mg total) by mouth at bedtime. 1-2 hours before bed 05/08/17   Fay Records, MD  fluticasone [facility]) 50 MCG/ACT nasal spray Place 2 sprays into both nostrils daily as needed for allergies or rhinitis.    [provider]  fluticasone (FLOVENT HFA) 220 MCG/ACT inhaler Inhale 1 puff into the lungs 2 (two) times daily. 05/08/17   Fay Records, MD  gabapentin (NEURONTIN) 300 MG capsule  05/18/17   [provider]  hydrOXYzine (ATARAX/VISTARIL) 25 MG tablet TAKE 1 TO 2 TABLETS BY MOUTH ONE HOUR BEFORE SLEEP 04/09/17   [provider]  meclizine (ANTIVERT) 25 MG tablet Take 1 tablet (25 mg total) by mouth 2 (two) times daily. 03/31/17   Nafziger, Tommi Rumps, NP  Methen-Hyosc-Meth Ree Kida Phos (ME/NAPHOS/MB/HYO1 PO) Take 1 tablet by mouth every 6 (six) hours as needed.    [provider]  metoprolol (LOPRESSOR) 50 MG tablet TAKE 1/2 TABLETS (25 MG TOTAL) BY MOUTH 2 (TWO) TIMES DAILY. 05/21/16   Weaver, Scott T, PA-C  NITROSTAT 0.4 MG SL tablet PLACE 1 TABLET (0.4 MG TOTAL) UNDER THE TONGUE EVERY 5 (FIVE) MINUTES AS NEEDED FOR CHEST PAIN. 08/20/16   Fay Records, MD  pentosan polysulfate (ELMIRON) 100 MG capsule Take 200 mg by mouth 2 (two) times daily.    [provider]  PREMARIN vaginal cream USE 1/2 GRAM VAGINALLY EVERY NIGHT AT BEDTIME FOR THE 1ST 2 WEEKS,, THEN 1/2 GRAM 2-3 TIMES A WEEK 12/10/16   Regina Eck, CNM  Probiotic Product (PROBIOTIC PO) Take 1 capsule by mouth daily. Nature Bounty's brand    [provider]  simvastatin (ZOCOR) 80 MG tablet TAKE 1 TABLET BY MOUTH AT BEDTIME 12/02/16   Nafziger, Tommi Rumps, NP  UNABLE TO FIND Inject 1 mL into the skin every 30 (thirty) days. b12 shots twice a month at MDs office    [provider]  zolpidem (AMBIEN) 5 MG tablet Take 1 tablet (5 mg total) by mouth at bedtime. 05/12/17   [name], NP      Allergies:    Allergies  Allergen Reactions  . Oxycodone Hcl Itching and Other (See Comments)    confusion  . Penicillins Itching and Rash    Has patient had a PCN reaction causing immediate rash, facial/tongue/throat swelling, SOB or lightheadedness with hypotension: yes rash that took a while to go away across the abdomen Has patient had a PCN reaction causing severe rash involving mucus membranes or skin necrosis: no Has patient had a PCN reaction that required hospitalization: no Has patient had a PCN reaction occurring within the last 10 years: No  If all of the above answers are "NO", then may proceed with Cephalosporin use.   . Aspirin     Burning in stomach  . Macrobid [Nitrofurantoin Monohyd Macro] Nausea And Vomiting  . Sulfa Antibiotics Nausea Only  . Tramadol Itching    Seeing things    Social History:   Social History   Social History  . Marital status: Married    Spouse name: N/A  . Number of children: N/A  . Years of education: N/A   Occupational History  . Not on file.   Social History Main Topics  . Smoking status: Light Tobacco Smoker    Packs/day: 0.50    Years: 50.00    Types: Cigarettes  . Smokeless tobacco: Never Used     Comment: smokes a half pack a day  . Alcohol use No  . Drug use: No  . Sexual activity: Not Currently    Birth control/ protection: Post-menopausal   Other Topics Concern  . Not on file   Social History Narrative   Is a homemaker   Married for 56 years    Has a daughter and son    Pets: Two dogs and a Neurosurgeon   Likes to garden ( flower beds).           Family History:   The patient's family history includes Cancer in her paternal grandfather; Coronary artery disease in her mother; Dementia in her father; Diabetes in her mother; Hypertension in her mother; Sudden death in her brother.    ROS:  Please see the history of present illness.  All other ROS reviewed and negative.     Physical Exam/Data:   Vitals:    05/25/17 1613 05/25/17 1625  BP:  120/83  Pulse:  (!) 59  Resp:  20  Temp:  98.2 F (36.8 C)  TempSrc:  Oral  SpO2:  99%  Weight: 183 lb (83 kg)   Height: 5\' 1"  (1.549 m)    No intake or output data in the 24 hours ending 05/25/17 1749 Filed Weights   05/25/17 1613  Weight: 183 lb (83 kg)   Body mass index is 34.58 kg/m.  General:  Well nourished, well developed, in no acute distress HEENT: normal Lymph: no adenopathy Neck: no JVD Endocrine:  No thryomegaly Vascular: No carotid bruits; FA pulses 2+ bilaterally without bruits  Cardiac:  normal S1, S2; RRR; no murmur  Lungs:  clear to auscultation bilaterally, no wheezing, rhonchi or rales  Abd: soft, nontender, no hepatomegaly  Ext: no edema Musculoskeletal:  No deformities, BUE and BLE strength normal and equal Skin: warm and dry  Neuro:  CNs 2-12 intact, no focal abnormalities noted Psych:  Normal affect    EKG:  The ECG that was done 05/25/17 was personally reviewed and demonstrates NSR, with nonspecific ST changes in anterolateral leads.   Relevant CV Studies: Transthoracic Echocardiography  01/2016 Study Conclusions  - Left ventricle: The cavity size was normal. Systolic function was   normal. The estimated ejection fraction was in the range of 55%   to 60%. Wall motion was normal; there were no regional wall   motion abnormalities. - Mitral valve: There was trivial regurgitation. - Tricuspid valve: There was trivial regurgitation.   Laboratory Data:  Chemistry Recent Labs Lab 05/25/17 1622  NA 137  K 4.1  CL 101  CO2 26  GLUCOSE 112*  BUN 8  CREATININE 0.86  CALCIUM 9.5  GFRNONAA >60  GFRAA >60  ANIONGAP 10  No results for input(s): PROT, ALBUMIN, AST, ALT, ALKPHOS, BILITOT in the last 168 hours. Hematology Recent Labs Lab 05/25/17 1622  WBC 7.0  RBC 4.07  HGB 12.3  HCT 36.9  MCV 90.7  MCH 30.2  MCHC 33.3  RDW 12.1  PLT 217   Cardiac EnzymesNo results for input(s): TROPONINI in  the last 168 hours.  Recent Labs Lab 05/25/17 1627  TROPIPOC 0.05    BNPNo results for input(s): BNP, PROBNP in the last 168 hours.  DDimer  Recent Labs Lab 05/25/17 1622  DDIMER 0.79*    Radiology/Studies:  Dg Chest 2 View  Result Date: 05/25/2017 CLINICAL DATA:  Chest pain. EXAM: CHEST  2 VIEW COMPARISON:  01/02/2017 FINDINGS: The cardiac silhouette, mediastinal and hilar contours are normal and stable. Stable coronary artery stent noted on the right. The lungs are clear. Stable mild eventration right hemidiaphragm. No pleural effusions. Stable cystic changes involving both humeral heads. IMPRESSION: No acute cardiopulmonary findings. Electronically Signed   By: Marijo Sanes M.D.   On: 05/25/2017 16:57    Assessment and Plan:   1. Chest pain with history of CAD: Patient with intermittent chest pain, relieved by Nitro and with history of CAD. Will trend troponin, start Nitro gtt.  - Plan for cath tomorrow - NPO after midnight.  - Continue beta blocker.  - Continue statin.  - Start ASA as well.   2. History of opioid abuse: - patient requests that we avoid narcotic pain meds.   3. HTN - Well controlled on current regimen  4. HLD - Continue simvastatin and Zetia. With persistent elevated LDL, would recommend referral to lipid clinic for [place].   Severity of Illness: The appropriate patient status for this patient is INPATIENT. Inpatient status is judged to be reasonable and necessary in order to provide the required intensity of service to ensure the patient's safety. The patient's presenting symptoms, physical exam findings, and initial radiographic and laboratory data in the context of their chronic comorbidities is felt to place them at high risk for further clinical deterioration. Furthermore, it is not anticipated that the patient will be medically stable for discharge from the hospital within 2 midnights of admission. The following factors support the patient status of  inpatient.   " The patient's presenting symptoms include chest pain . " The worrisome physical exam findings include SOB  " The initial radiographic and laboratory data are worrisome because of history of CAD, MI " The chronic co-morbidities include HTN, HLD   * I certify that at the point of admission it is my clinical judgment that the patient will require inpatient hospital care spanning beyond 2 midnights from the point of admission due to high intensity of service, high risk for further deterioration and high frequency of surveillance required.*  For questions or updates, please contact [facility] Please consult [email or web address] for contact info under Cardiology/STEMI.  Signed, Arbutus Leas, NP  05/25/2017 5:49 PM   The patient was seen, examined and discussed with Arbutus Leas, NP and I agree with the above.   71 year old female with a past medical history of CAD s/p MI in 1997 with PCI to RCA, MI in 2007 s/p DES x 3 to RCA, HTN, and HLD. Presented to the ED on 05/25/17 with chest pain. The pain is retrosternal with radiation to neck, currently 6/10. This is the first pain in a while, but has noticed SOB in the last few months. She recently had a Lexiscan stress  test that showed no ischemia. This was done as the patient reported persistent SOB with activity. She recently saw Dr. Harrington Challenger and reported the same persistent SOB. She was given an inhaler.  Last cath was in 2013 at which time she was noted to have a 60-70% LAD lesion, 50% mid circumflex lesion, multiple overlapping stents in the RCA. It was felt that the patient could ultimately require CABG with borderline significant disease in the LAD and LCx.  She quit taking dilaudid 3 months ago and would like to stay away from narcotics.   ECG shows SR, negative T waves in leads V1-3 suggestive of anterior ischemia. Troponin is negative, crea normal. We will start NTG drip, tylenol for pain. We will plan for a left cardiac catheterization  in the morning.  Start Heparin drip. She is allergic to ASA, chronically on Plavix, I will hold in case she needs CABG.   Ena Dawley, MD 05/25/2017

## 2017-05-25 NOTE — Progress Notes (Signed)
2ANTICOAGULATION CONSULT NOTE - Initial Consult  Pharmacy Consult for heparin dosing Indication: chest pain/ACS  Allergies  Allergen Reactions  . Oxycodone Hcl Itching and Other (See Comments)    Confusion, hallucinations  . Penicillins Itching and Rash    Has patient had a PCN reaction causing immediate rash, facial/tongue/throat swelling, SOB or lightheadedness with hypotension: yes rash that took a while to go away across the abdomen Has patient had a PCN reaction causing severe rash involving mucus membranes or skin necrosis: no Has patient had a PCN reaction that required hospitalization: no Has patient had a PCN reaction occurring within the last 10 years: No If all of the above answers are "NO", then may proceed with Cephalosporin use.   . Aspirin Nausea And Vomiting and Other (See Comments)    Burning in stomach  . Macrobid [Nitrofurantoin Monohyd Macro] Nausea And Vomiting  . Sulfa Antibiotics Nausea Only  . Tramadol Itching and Other (See Comments)    hallucinations    Patient Measurements: Height: 5\' 1"  (154.9 cm) Weight: 183 lb (83 kg) IBW/kg (Calculated) : 47.8 Heparin Dosing Weight: 66.7kg  Vital Signs: Temp: 98.2 F (36.8 C) (10/08 1625) Temp Source: Oral (10/08 1625) BP: 139/75 (10/08 1845) Pulse Rate: 64 (10/08 1845)  Labs:  Recent Labs  05/25/17 1622  HGB 12.3  HCT 36.9  PLT 217  CREATININE 0.86    Estimated Creatinine Clearance: 58.6 mL/min (by C-G formula based on SCr of 0.86 mg/dL).   Medical History: Past Medical History:  Diagnosis Date  . Arthritis   . Blood transfusion without reported diagnosis   . CAD (coronary artery disease)    a. 1997 MI/PCI RCA;  b. 2007 MI/PCI of 100% RCA with Taxus DES x 3 placed, EF 55%;  c. 2010 Cath: stable anatomy;  d. 05/2012 NSTEMI/Cath/PCI: LM nl, LAD 60-42m, D1 20ost, LCX 30m, RCA 40-73m ISR, 60/95d (Treated w/ 2.75x33 Xience Xpedition DES), PDA 21m (Treated w/ PTCA), EF 60%.  . Carpal tunnel syndrome  on both sides   . Chronic lower back pain   . Depression   . GERD (gastroesophageal reflux disease)   . Hyperlipidemia   . Myocardial infarction [facility]) 1997, 2007, 2013  . Numbness of foot    Left foot - back surgery 2009    Medications:  Scheduled:  . iopamidol        Assessment: Bianca [name] admitted with right sided chest pain. Pharmacy consulted for heparin dosing for ACS. Troponin 0.05, D-Dimer 0.79, CXR normal. No anticoag PTA. No bleeding reported.  Allergic to aspirin ( N/V, stomach burning). CT angio ordered. PMH CAD w/multiple stents.   Goal of Therapy:  Heparin level 0.3-0.7 units/ml Monitor platelets by anticoagulation protocol: Yes   Plan:  Give heparin 4000 units bolus x 1 Start heparin infusion at 800 units/hr 8 hour heparin level, daily heparin and CBC Monitor S/Sx of bleeding.   Jerrye Noble, PharmD Candidate 05/25/2017,7:08 PM

## 2017-05-25 NOTE — ED Notes (Signed)
Ice pack placed on right hand due to vein blew while attempting IV access and caused a hematoma to right hand.  Pt denies pain.

## 2017-05-25 NOTE — ED Notes (Signed)
On way to CT 

## 2017-05-25 NOTE — ED Triage Notes (Signed)
Per EMS, patient is from home complaining of chest pain all across going up into neck.  States that it does not feel like acid reflux.  Denies N/V.  Pain began 1515.  Allergic to aspirin so none given en route.  Pt took 1 nitro prior to EMS arrival taking pain from 9 to 6.  1 more nitro given en route dropping pressure so no more given.  Hx of 5 stents and last MI in 2013.  12 Lead unremarkable. NSR.  No obvious distress.  No meds taken today.  114/62, 60 HR, 18 RR, 95% RA.

## 2017-05-26 ENCOUNTER — Encounter (HOSPITAL_COMMUNITY)
Admission: EM | Disposition: A | Discharge status: Home Health | Payer: Self-pay | Source: Home / Self Care | Attending: Cardiothoracic Surgery

## 2017-05-26 ENCOUNTER — Inpatient Hospital Stay (HOSPITAL_COMMUNITY): Payer: 59 | Admitting: Certified Registered"

## 2017-05-26 ENCOUNTER — Inpatient Hospital Stay (HOSPITAL_COMMUNITY)
Admission: EM | Disposition: A | Discharge status: Home Health | Payer: Self-pay | Source: Home / Self Care | Attending: Cardiothoracic Surgery

## 2017-05-26 ENCOUNTER — Encounter (HOSPITAL_COMMUNITY): Payer: Self-pay | Admitting: Certified Registered Nurse Anesthetist

## 2017-05-26 ENCOUNTER — Inpatient Hospital Stay (HOSPITAL_COMMUNITY): Payer: 59

## 2017-05-26 DIAGNOSIS — I25110 Atherosclerotic heart disease of native coronary artery with unstable angina pectoris: Secondary | ICD-10-CM

## 2017-05-26 DIAGNOSIS — Z951 Presence of aortocoronary bypass graft: Secondary | ICD-10-CM

## 2017-05-26 DIAGNOSIS — R079 Chest pain, unspecified: Secondary | ICD-10-CM

## 2017-05-26 HISTORY — PX: LEFT HEART CATH AND CORONARY ANGIOGRAPHY: CATH118249

## 2017-05-26 HISTORY — PX: CORONARY ARTERY BYPASS GRAFT: SHX141

## 2017-05-26 HISTORY — PX: TEE WITHOUT CARDIOVERSION: SHX5443

## 2017-05-26 LAB — URINALYSIS, ROUTINE W REFLEX MICROSCOPIC
Bilirubin Urine: NEGATIVE
Glucose, UA: NEGATIVE mg/dL
Hgb urine dipstick: NEGATIVE
Ketones, ur: NEGATIVE mg/dL
Leukocytes, UA: NEGATIVE
Nitrite: NEGATIVE
Protein, ur: NEGATIVE mg/dL
Specific Gravity, Urine: 1.027 (ref 1.005–1.030)
pH: 6 (ref 5.0–8.0)

## 2017-05-26 LAB — POCT I-STAT 3, ART BLOOD GAS (G3+)
Acid-Base Excess: 3 mmol/L — ABNORMAL HIGH (ref 0.0–2.0)
Bicarbonate: 26.8 mmol/L (ref 20.0–28.0)
Bicarbonate: 24.2 mmol/L (ref 20.0–28.0)
O2 Saturation: 100 %
O2 Saturation: 100 %
TCO2: 28 mmol/L (ref 22–32)
TCO2: 25 mmol/L (ref 22–32)
pCO2 arterial: 38 mmHg (ref 32.0–48.0)
pCO2 arterial: 37.6 mmHg (ref 32.0–48.0)
pH, Arterial: 7.457 — ABNORMAL HIGH (ref 7.350–7.450)
pH, Arterial: 7.418 (ref 7.350–7.450)
pO2, Arterial: 437 mmHg — ABNORMAL HIGH (ref 83.0–108.0)
pO2, Arterial: 331 mmHg — ABNORMAL HIGH (ref 83.0–108.0)

## 2017-05-26 LAB — PLATELET COUNT: Platelets: 129 10*3/uL — ABNORMAL LOW (ref 150–400)

## 2017-05-26 LAB — GLUCOSE, CAPILLARY
Glucose-Capillary: 135 mg/dL — ABNORMAL HIGH (ref 65–99)
Glucose-Capillary: 133 mg/dL — ABNORMAL HIGH (ref 65–99)
Glucose-Capillary: 103 mg/dL — ABNORMAL HIGH (ref 65–99)
Glucose-Capillary: 98 mg/dL (ref 65–99)
Glucose-Capillary: 97 mg/dL (ref 65–99)

## 2017-05-26 LAB — POCT I-STAT, CHEM 8
BUN: 6 mg/dL (ref 6–20)
BUN: 4 mg/dL — ABNORMAL LOW (ref 6–20)
BUN: 5 mg/dL — ABNORMAL LOW (ref 6–20)
BUN: 4 mg/dL — ABNORMAL LOW (ref 6–20)
BUN: 4 mg/dL — ABNORMAL LOW (ref 6–20)
Calcium, Ion: 1.28 mmol/L (ref 1.15–1.40)
Calcium, Ion: 1.02 mmol/L — ABNORMAL LOW (ref 1.15–1.40)
Calcium, Ion: 1.19 mmol/L (ref 1.15–1.40)
Calcium, Ion: 1.04 mmol/L — ABNORMAL LOW (ref 1.15–1.40)
Calcium, Ion: 1.06 mmol/L — ABNORMAL LOW (ref 1.15–1.40)
Chloride: 103 mmol/L (ref 101–111)
Chloride: 101 mmol/L (ref 101–111)
Chloride: 104 mmol/L (ref 101–111)
Chloride: 101 mmol/L (ref 101–111)
Chloride: 102 mmol/L (ref 101–111)
Creatinine, Ser: 0.6 mg/dL (ref 0.44–1.00)
Creatinine, Ser: 0.5 mg/dL (ref 0.44–1.00)
Creatinine, Ser: 0.6 mg/dL (ref 0.44–1.00)
Creatinine, Ser: 0.5 mg/dL (ref 0.44–1.00)
Creatinine, Ser: 0.5 mg/dL (ref 0.44–1.00)
Glucose, Bld: 103 mg/dL — ABNORMAL HIGH (ref 65–99)
Glucose, Bld: 101 mg/dL — ABNORMAL HIGH (ref 65–99)
Glucose, Bld: 93 mg/dL (ref 65–99)
Glucose, Bld: 116 mg/dL — ABNORMAL HIGH (ref 65–99)
Glucose, Bld: 121 mg/dL — ABNORMAL HIGH (ref 65–99)
HCT: 31 % — ABNORMAL LOW (ref 36.0–46.0)
HCT: 24 % — ABNORMAL LOW (ref 36.0–46.0)
HCT: 26 % — ABNORMAL LOW (ref 36.0–46.0)
HCT: 21 % — ABNORMAL LOW (ref 36.0–46.0)
HCT: 23 % — ABNORMAL LOW (ref 36.0–46.0)
Hemoglobin: 10.5 g/dL — ABNORMAL LOW (ref 12.0–15.0)
Hemoglobin: 8.2 g/dL — ABNORMAL LOW (ref 12.0–15.0)
Hemoglobin: 8.8 g/dL — ABNORMAL LOW (ref 12.0–15.0)
Hemoglobin: 7.1 g/dL — ABNORMAL LOW (ref 12.0–15.0)
Hemoglobin: 7.8 g/dL — ABNORMAL LOW (ref 12.0–15.0)
Potassium: 3.9 mmol/L (ref 3.5–5.1)
Potassium: 3.7 mmol/L (ref 3.5–5.1)
Potassium: 3.7 mmol/L (ref 3.5–5.1)
Potassium: 3.9 mmol/L (ref 3.5–5.1)
Potassium: 4.1 mmol/L (ref 3.5–5.1)
Sodium: 138 mmol/L (ref 135–145)
Sodium: 137 mmol/L (ref 135–145)
Sodium: 139 mmol/L (ref 135–145)
Sodium: 137 mmol/L (ref 135–145)
Sodium: 138 mmol/L (ref 135–145)
TCO2: 26 mmol/L (ref 22–32)
TCO2: 24 mmol/L (ref 22–32)
TCO2: 28 mmol/L (ref 22–32)
TCO2: 24 mmol/L (ref 22–32)
TCO2: 26 mmol/L (ref 22–32)

## 2017-05-26 LAB — CBC
HEMATOCRIT: 32.9 % — AB (ref 36.0–46.0)
HEMATOCRIT: 25.5 % — AB (ref 36.0–46.0)
HEMOGLOBIN: 11.1 g/dL — AB (ref 12.0–15.0)
Hemoglobin: 8.8 g/dL — ABNORMAL LOW (ref 12.0–15.0)
MCH: 30.4 pg (ref 26.0–34.0)
MCH: 31 pg (ref 26.0–34.0)
MCHC: 33.7 g/dL (ref 30.0–36.0)
MCHC: 34.5 g/dL (ref 30.0–36.0)
MCV: 90.1 fL (ref 78.0–100.0)
MCV: 89.8 fL (ref 78.0–100.0)
PLATELETS: 110 10*3/uL — AB (ref 150–400)
Platelets: 225 10*3/uL (ref 150–400)
RBC: 3.65 MIL/uL — AB (ref 3.87–5.11)
RBC: 2.84 MIL/uL — ABNORMAL LOW (ref 3.87–5.11)
RDW: 12.2 % (ref 11.5–15.5)
RDW: 12.3 % (ref 11.5–15.5)
WBC: 7.8 10*3/uL (ref 4.0–10.5)
WBC: 9.1 10*3/uL (ref 4.0–10.5)

## 2017-05-26 LAB — SURGICAL PCR SCREEN
MRSA, PCR: NEGATIVE
Staphylococcus aureus: NEGATIVE

## 2017-05-26 LAB — POCT I-STAT 4, (NA,K, GLUC, HGB,HCT)
Glucose, Bld: 129 mg/dL — ABNORMAL HIGH (ref 65–99)
HCT: 22 % — ABNORMAL LOW (ref 36.0–46.0)
Hemoglobin: 7.5 g/dL — ABNORMAL LOW (ref 12.0–15.0)
Potassium: 3.8 mmol/L (ref 3.5–5.1)
Sodium: 139 mmol/L (ref 135–145)

## 2017-05-26 LAB — CREATININE, SERUM
CREATININE: 0.83 mg/dL (ref 0.44–1.00)
GFR calc non Af Amer: >60 mL/min (ref >60)

## 2017-05-26 LAB — HEMOGLOBIN AND HEMATOCRIT, BLOOD
HCT: 22.3 % — ABNORMAL LOW (ref 36.0–46.0)
Hemoglobin: 7.8 g/dL — ABNORMAL LOW (ref 12.0–15.0)

## 2017-05-26 LAB — PLATELET INHIBITION P2Y12: Platelet Function  P2Y12: 353 [PRU] (ref 194–418)

## 2017-05-26 LAB — APTT: APTT: 29 s (ref 24–36)

## 2017-05-26 LAB — TROPONIN I: TROPONIN I: 0.91 ng/mL — AB (ref <0.03)

## 2017-05-26 LAB — PROTIME-INR
INR: 1.46
Prothrombin Time: 17.6 seconds — ABNORMAL HIGH (ref 11.4–15.2)

## 2017-05-26 SURGERY — CORONARY ARTERY BYPASS GRAFTING (CABG)
Anesthesia: General | Site: Chest

## 2017-05-26 SURGERY — LEFT HEART CATH AND CORONARY ANGIOGRAPHY
Anesthesia: LOCAL

## 2017-05-26 MED ORDER — PENTOSAN POLYSULFATE SODIUM 100 MG PO CAPS
200.0000 mg | ORAL_CAPSULE | Freq: Two times a day (BID) | ORAL | Status: DC
  Administered 2017-05-27 – 2017-06-05 (×14): 200 mg via ORAL
  Filled 2017-05-26 (×23): qty 2

## 2017-05-26 MED ORDER — METOPROLOL TARTRATE 5 MG/5ML IV SOLN: 2.5000 mg | INTRAVENOUS | Status: DC | PRN

## 2017-05-26 MED ORDER — NITROGLYCERIN IN D5W 200-5 MCG/ML-% IV SOLN
2.0000 ug/min | INTRAVENOUS | Status: DC
  Filled 2017-05-26: qty 250

## 2017-05-26 MED ORDER — MECLIZINE HCL 25 MG PO TABS
25.0000 mg | ORAL_TABLET | Freq: Two times a day (BID) | ORAL | Status: DC
  Administered 2017-05-27 – 2017-06-05 (×19): 25 mg via ORAL
  Filled 2017-05-26: qty 1
  Filled 2017-05-26 (×4): qty 2
  Filled 2017-05-26 (×4): qty 1
  Filled 2017-05-26 (×2): qty 2
  Filled 2017-05-26: qty 1
  Filled 2017-05-26 (×4): qty 2
  Filled 2017-05-26: qty 1
  Filled 2017-05-26: qty 2
  Filled 2017-05-26 (×2): qty 1

## 2017-05-26 MED ORDER — NITROGLYCERIN 0.4 MG SL SUBL: 0.4000 mg | SUBLINGUAL_TABLET | SUBLINGUAL | Status: DC | PRN

## 2017-05-26 MED ORDER — LEVOFLOXACIN IN D5W 500 MG/100ML IV SOLN
500.0000 mg | INTRAVENOUS | Status: DC
Start: 2017-05-26 — End: 2017-05-26
  Filled 2017-05-26: qty 100

## 2017-05-26 MED ORDER — HYDROXYZINE HCL 25 MG PO TABS
25.0000 mg | ORAL_TABLET | Freq: Every day | ORAL | Status: DC
  Administered 2017-05-27 – 2017-06-04 (×9): 25 mg via ORAL
  Filled 2017-05-26 (×10): qty 1

## 2017-05-26 MED ORDER — TRANEXAMIC ACID 1000 MG/10ML IV SOLN
1.5000 mg/kg/h | INTRAVENOUS | Status: DC
  Administered 2017-05-26: 1.5 mg/kg/h via INTRAVENOUS
  Filled 2017-05-26: qty 25

## 2017-05-26 MED ORDER — SODIUM CHLORIDE 0.9 % IV SOLN
30.0000 ug/min | INTRAVENOUS | Status: DC
  Administered 2017-05-26: 10 ug/min via INTRAVENOUS
  Filled 2017-05-26: qty 2

## 2017-05-26 MED ORDER — DEXMEDETOMIDINE HCL IN NACL 400 MCG/100ML IV SOLN
0.0000 ug/kg/h | INTRAVENOUS | Status: DC
  Administered 2017-05-26: 0.7 ug/kg/h via INTRAVENOUS
  Administered 2017-05-26: 0.4 ug/kg/h via INTRAVENOUS
  Filled 2017-05-26: qty 100

## 2017-05-26 MED ORDER — HEPARIN (PORCINE) IN NACL 2-0.9 UNIT/ML-% IJ SOLN
INTRAMUSCULAR | Status: AC
Start: 2017-05-26 — End: 2017-05-26
  Filled 2017-05-26: qty 500

## 2017-05-26 MED ORDER — HEPARIN (PORCINE) IN NACL 2-0.9 UNIT/ML-% IJ SOLN
INTRAMUSCULAR | Status: AC
  Filled 2017-05-26: qty 500

## 2017-05-26 MED ORDER — SODIUM CHLORIDE 0.9 % IV SOLN
1250.0000 mg | INTRAVENOUS | Status: DC
Start: 2017-05-26 — End: 2017-05-26
  Administered 2017-05-26: 1250 mg via INTRAVENOUS
  Filled 2017-05-26: qty 1250

## 2017-05-26 MED ORDER — BISACODYL 5 MG PO TBEC
10.0000 mg | DELAYED_RELEASE_TABLET | Freq: Every day | ORAL | Status: DC
  Administered 2017-05-27 – 2017-05-30 (×4): 10 mg via ORAL
  Filled 2017-05-26 (×4): qty 2

## 2017-05-26 MED ORDER — FENTANYL CITRATE (PF) 250 MCG/5ML IJ SOLN
INTRAMUSCULAR | Status: AC
  Filled 2017-05-26: qty 5

## 2017-05-26 MED ORDER — ROCURONIUM BROMIDE 10 MG/ML (PF) SYRINGE
PREFILLED_SYRINGE | INTRAVENOUS | Status: AC
  Filled 2017-05-26: qty 5

## 2017-05-26 MED ORDER — TRANEXAMIC ACID (OHS) PUMP PRIME SOLUTION
2.0000 mg/kg | INTRAVENOUS | Status: DC
  Filled 2017-05-26: qty 1.66

## 2017-05-26 MED ORDER — MIDAZOLAM HCL 2 MG/2ML IJ SOLN: 2.0000 mg | INTRAMUSCULAR | Status: DC | PRN

## 2017-05-26 MED ORDER — ACETAMINOPHEN 160 MG/5ML PO SOLN: 650.0000 mg | Freq: Once | ORAL | Status: AC

## 2017-05-26 MED ORDER — SODIUM CHLORIDE 0.9 % IV SOLN
INTRAVENOUS | Status: DC
  Filled 2017-05-26: qty 30

## 2017-05-26 MED ORDER — SODIUM CHLORIDE 0.9 % IV SOLN
INTRAVENOUS | Status: DC
  Administered 2017-05-26: 07:00:00 via INTRAVENOUS

## 2017-05-26 MED ORDER — ATORVASTATIN CALCIUM 80 MG PO TABS
80.0000 mg | ORAL_TABLET | Freq: Every day | ORAL | Status: DC
  Administered 2017-05-27 – 2017-06-04 (×9): 80 mg via ORAL
  Filled 2017-05-26 (×9): qty 1

## 2017-05-26 MED ORDER — SODIUM CHLORIDE 0.9% FLUSH
3.0000 mL | Freq: Two times a day (BID) | INTRAVENOUS | Status: DC
  Administered 2017-05-26: 3 mL via INTRAVENOUS

## 2017-05-26 MED ORDER — VERAPAMIL HCL 2.5 MG/ML IV SOLN
INTRAVENOUS | Status: AC
  Filled 2017-05-26: qty 2

## 2017-05-26 MED ORDER — PROPOFOL 10 MG/ML IV BOLUS
INTRAVENOUS | Status: AC
  Filled 2017-05-26: qty 20

## 2017-05-26 MED ORDER — SODIUM CHLORIDE 0.9 % IV SOLN
0.0000 ug/min | INTRAVENOUS | Status: DC
  Administered 2017-05-26: 5 ug/min via INTRAVENOUS
  Filled 2017-05-26: qty 2

## 2017-05-26 MED ORDER — LACTATED RINGERS IV SOLN: 500.0000 mL | Freq: Once | INTRAVENOUS | Status: DC | PRN

## 2017-05-26 MED ORDER — METOPROLOL TARTRATE 25 MG/10 ML ORAL SUSPENSION: 12.5000 mg | Freq: Two times a day (BID) | ORAL | Status: DC

## 2017-05-26 MED ORDER — ETOMIDATE 2 MG/ML IV SOLN
INTRAVENOUS | Status: AC
  Filled 2017-05-26: qty 10

## 2017-05-26 MED ORDER — ACETAMINOPHEN 160 MG/5ML PO SOLN
1000.0000 mg | Freq: Four times a day (QID) | ORAL | Status: DC
  Administered 2017-05-27 – 2017-05-29 (×2): 1000 mg
  Filled 2017-05-26 (×2): qty 40.6

## 2017-05-26 MED ORDER — ESCITALOPRAM OXALATE 20 MG PO TABS
20.0000 mg | ORAL_TABLET | Freq: Every day | ORAL | Status: DC
  Administered 2017-05-27 – 2017-06-05 (×10): 20 mg via ORAL
  Filled 2017-05-26: qty 1
  Filled 2017-05-26: qty 2
  Filled 2017-05-26: qty 1
  Filled 2017-05-26 (×2): qty 2
  Filled 2017-05-26: qty 1
  Filled 2017-05-26 (×2): qty 2
  Filled 2017-05-26: qty 1
  Filled 2017-05-26: qty 2

## 2017-05-26 MED ORDER — ALBUMIN HUMAN 5 % IV SOLN
INTRAVENOUS | Status: DC | PRN
  Administered 2017-05-26: 18:00:00 via INTRAVENOUS

## 2017-05-26 MED ORDER — LACTATED RINGERS IV SOLN
INTRAVENOUS | Status: DC | PRN
  Administered 2017-05-26: 13:00:00 via INTRAVENOUS

## 2017-05-26 MED ORDER — LACTATED RINGERS IV SOLN
INTRAVENOUS | Status: DC | PRN
Start: 2017-05-26 — End: 2017-05-26
  Administered 2017-05-26: 13:00:00 via INTRAVENOUS

## 2017-05-26 MED ORDER — PROPOFOL 10 MG/ML IV BOLUS
INTRAVENOUS | Status: DC | PRN
  Administered 2017-05-26: 20 mg via INTRAVENOUS
  Administered 2017-05-26: 40 mg via INTRAVENOUS

## 2017-05-26 MED ORDER — VANCOMYCIN HCL 10 G IV SOLR
1250.0000 mg | INTRAVENOUS | Status: DC
  Filled 2017-05-26: qty 1250

## 2017-05-26 MED ORDER — DEXMEDETOMIDINE HCL IN NACL 400 MCG/100ML IV SOLN
0.1000 ug/kg/h | INTRAVENOUS | Status: DC
  Administered 2017-05-26: .5 ug/kg/h via INTRAVENOUS
  Filled 2017-05-26: qty 100

## 2017-05-26 MED ORDER — CHLORHEXIDINE GLUCONATE CLOTH 2 % EX PADS: 6.0000 | MEDICATED_PAD | Freq: Once | CUTANEOUS | Status: DC

## 2017-05-26 MED ORDER — SODIUM CHLORIDE 0.9% FLUSH
3.0000 mL | Freq: Two times a day (BID) | INTRAVENOUS | Status: DC
  Administered 2017-05-27 – 2017-05-30 (×5): 3 mL via INTRAVENOUS

## 2017-05-26 MED ORDER — HEPARIN SODIUM (PORCINE) 1000 UNIT/ML IJ SOLN
INTRAMUSCULAR | Status: DC
  Filled 2017-05-26: qty 30

## 2017-05-26 MED ORDER — DEXTROSE 5 % IV SOLN
1.5000 g | INTRAVENOUS | Status: DC
  Administered 2017-05-26: 1.5 g via INTRAVENOUS
  Administered 2017-05-26: .75 g via INTRAVENOUS
  Filled 2017-05-26: qty 1.5

## 2017-05-26 MED ORDER — ASPIRIN EC 325 MG PO TBEC
325.0000 mg | DELAYED_RELEASE_TABLET | Freq: Every day | ORAL | Status: DC
  Filled 2017-05-26: qty 1

## 2017-05-26 MED ORDER — SODIUM CHLORIDE 0.9 % IV SOLN
INTRAVENOUS | Status: DC
  Administered 2017-05-26: 1.5 [IU]/h via INTRAVENOUS
  Filled 2017-05-26: qty 1

## 2017-05-26 MED ORDER — MIDAZOLAM HCL 2 MG/2ML IJ SOLN
INTRAMUSCULAR | Status: DC | PRN
  Administered 2017-05-26: 1 mg via INTRAVENOUS

## 2017-05-26 MED ORDER — LACTATED RINGERS IV SOLN
INTRAVENOUS | Status: DC | PRN
  Administered 2017-05-26: 14:00:00 via INTRAVENOUS

## 2017-05-26 MED ORDER — SODIUM CHLORIDE 0.9% FLUSH: 3.0000 mL | INTRAVENOUS | Status: DC | PRN

## 2017-05-26 MED ORDER — POTASSIUM CHLORIDE 10 MEQ/50ML IV SOLN
10.0000 meq | INTRAVENOUS | Status: AC
  Administered 2017-05-26 (×3): 10 meq via INTRAVENOUS

## 2017-05-26 MED ORDER — CHLORHEXIDINE GLUCONATE 0.12 % MT SOLN: 15.0000 mL | Freq: Once | OROMUCOSAL | Status: DC

## 2017-05-26 MED ORDER — MAGNESIUM SULFATE 4 GM/100ML IV SOLN
4.0000 g | Freq: Once | INTRAVENOUS | Status: AC
  Administered 2017-05-26: 4 g via INTRAVENOUS

## 2017-05-26 MED ORDER — CHLORHEXIDINE GLUCONATE 0.12 % MT SOLN: 15.0000 mL | OROMUCOSAL | Status: DC

## 2017-05-26 MED ORDER — EPINEPHRINE PF 1 MG/ML IJ SOLN
0.0000 ug/min | INTRAMUSCULAR | Status: DC
  Filled 2017-05-26: qty 4

## 2017-05-26 MED ORDER — ACETAMINOPHEN 650 MG RE SUPP
650.0000 mg | Freq: Once | RECTAL | Status: AC
  Administered 2017-05-26: 650 mg via RECTAL

## 2017-05-26 MED ORDER — LIDOCAINE HCL 2 % IJ SOLN
INTRAMUSCULAR | Status: AC
Start: 2017-05-26 — End: 2017-05-26
  Filled 2017-05-26: qty 10

## 2017-05-26 MED ORDER — VANCOMYCIN HCL IN DEXTROSE 1-5 GM/200ML-% IV SOLN
1000.0000 mg | Freq: Once | INTRAVENOUS | Status: AC
  Administered 2017-05-27: 1000 mg via INTRAVENOUS
  Filled 2017-05-26: qty 200

## 2017-05-26 MED ORDER — FENTANYL CITRATE (PF) 100 MCG/2ML IJ SOLN
INTRAMUSCULAR | Status: AC
  Filled 2017-05-26: qty 2

## 2017-05-26 MED ORDER — PROTAMINE SULFATE 10 MG/ML IV SOLN
INTRAVENOUS | Status: AC
  Filled 2017-05-26: qty 25

## 2017-05-26 MED ORDER — BUPROPION HCL ER (XL) 150 MG PO TB24
150.0000 mg | ORAL_TABLET | Freq: Every day | ORAL | Status: DC
  Administered 2017-05-27 – 2017-06-05 (×10): 150 mg via ORAL
  Filled 2017-05-26 (×10): qty 1

## 2017-05-26 MED ORDER — HEPARIN SODIUM (PORCINE) 1000 UNIT/ML IJ SOLN
INTRAMUSCULAR | Status: DC | PRN
  Administered 2017-05-26: 20000 [IU] via INTRAVENOUS

## 2017-05-26 MED ORDER — MAGNESIUM SULFATE 50 % IJ SOLN
40.0000 meq | INTRAMUSCULAR | Status: DC
  Filled 2017-05-26: qty 10

## 2017-05-26 MED ORDER — HEMOSTATIC AGENTS (NO CHARGE) OPTIME
TOPICAL | Status: DC | PRN
  Administered 2017-05-26 (×2): 1 via TOPICAL

## 2017-05-26 MED ORDER — TRANEXAMIC ACID (OHS) BOLUS VIA INFUSION
15.0000 mg/kg | INTRAVENOUS | Status: DC
  Administered 2017-05-26: 1245 mg via INTRAVENOUS
  Filled 2017-05-26: qty 1245

## 2017-05-26 MED ORDER — METOCLOPRAMIDE HCL 5 MG/ML IJ SOLN
10.0000 mg | Freq: Four times a day (QID) | INTRAMUSCULAR | Status: AC
  Administered 2017-05-26 – 2017-05-27 (×4): 10 mg via INTRAVENOUS
  Filled 2017-05-26 (×4): qty 2

## 2017-05-26 MED ORDER — ADULT MULTIVITAMIN W/MINERALS CH: 1.0000 | ORAL_TABLET | Freq: Every day | ORAL | Status: DC

## 2017-05-26 MED ORDER — MORPHINE SULFATE (PF) 4 MG/ML IV SOLN
2.0000 mg | INTRAVENOUS | Status: DC | PRN
  Administered 2017-05-27: 4 mg via INTRAVENOUS
  Administered 2017-05-27: 2 mg via INTRAVENOUS
  Administered 2017-05-27 – 2017-05-28 (×9): 4 mg via INTRAVENOUS
  Filled 2017-05-26 (×15): qty 1

## 2017-05-26 MED ORDER — ONDANSETRON HCL 4 MG/2ML IJ SOLN: 4.0000 mg | Freq: Four times a day (QID) | INTRAMUSCULAR | Status: DC | PRN

## 2017-05-26 MED ORDER — INSULIN ASPART 100 UNIT/ML ~~LOC~~ SOLN
0.0000 [IU] | SUBCUTANEOUS | Status: DC
  Administered 2017-05-27 (×2): 2 [IU] via SUBCUTANEOUS

## 2017-05-26 MED ORDER — FENTANYL CITRATE (PF) 100 MCG/2ML IJ SOLN
INTRAMUSCULAR | Status: DC | PRN
  Administered 2017-05-26: 50 ug via INTRAVENOUS
  Administered 2017-05-26: 25 ug via INTRAVENOUS

## 2017-05-26 MED ORDER — SODIUM CHLORIDE 0.9 % IV SOLN: INTRAVENOUS | Status: DC

## 2017-05-26 MED ORDER — IOPAMIDOL (ISOVUE-370) INJECTION 76%
INTRAVENOUS | Status: AC
  Filled 2017-05-26: qty 100

## 2017-05-26 MED ORDER — ORAL CARE MOUTH RINSE
15.0000 mL | Freq: Four times a day (QID) | OROMUCOSAL | Status: DC
  Administered 2017-05-27 (×2): 15 mL via OROMUCOSAL

## 2017-05-26 MED ORDER — ASPIRIN 81 MG PO CHEW: 324.0000 mg | CHEWABLE_TABLET | Freq: Every day | ORAL | Status: DC

## 2017-05-26 MED ORDER — ACETAMINOPHEN 500 MG PO TABS
1000.0000 mg | ORAL_TABLET | Freq: Four times a day (QID) | ORAL | Status: DC
Start: 2017-05-27 — End: 2017-05-29
  Administered 2017-05-27 – 2017-05-29 (×9): 1000 mg via ORAL
  Filled 2017-05-26 (×9): qty 2

## 2017-05-26 MED ORDER — NITROGLYCERIN 1 MG/10 ML FOR IR/CATH LAB
INTRA_ARTERIAL | Status: DC | PRN
  Administered 2017-05-26: 200 ug via INTRACORONARY

## 2017-05-26 MED ORDER — ONDANSETRON HCL 4 MG/2ML IJ SOLN
4.0000 mg | Freq: Four times a day (QID) | INTRAMUSCULAR | Status: DC | PRN
  Administered 2017-05-29 – 2017-05-30 (×2): 4 mg via INTRAVENOUS
  Filled 2017-05-26 (×2): qty 2

## 2017-05-26 MED ORDER — PHENYLEPHRINE HCL 10 MG/ML IJ SOLN
30.0000 ug/min | INTRAMUSCULAR | Status: DC
  Filled 2017-05-26: qty 2

## 2017-05-26 MED ORDER — POTASSIUM CHLORIDE 2 MEQ/ML IV SOLN
80.0000 meq | INTRAVENOUS | Status: DC
  Filled 2017-05-26: qty 40

## 2017-05-26 MED ORDER — ZOLPIDEM TARTRATE 5 MG PO TABS
5.0000 mg | ORAL_TABLET | Freq: Every day | ORAL | Status: DC
  Administered 2017-05-26: 5 mg via ORAL
  Filled 2017-05-26: qty 1

## 2017-05-26 MED ORDER — METOPROLOL TARTRATE 12.5 MG HALF TABLET: 12.5000 mg | ORAL_TABLET | Freq: Once | ORAL | Status: DC

## 2017-05-26 MED ORDER — ACETAMINOPHEN 325 MG PO TABS: 650.0000 mg | ORAL_TABLET | ORAL | Status: DC | PRN

## 2017-05-26 MED ORDER — ATORVASTATIN CALCIUM 40 MG PO TABS
40.0000 mg | ORAL_TABLET | Freq: Every day | ORAL | Status: DC
Start: 2017-05-26 — End: 2017-05-26

## 2017-05-26 MED ORDER — HEPARIN SODIUM (PORCINE) 5000 UNIT/ML IJ SOLN
5000.0000 [IU] | Freq: Three times a day (TID) | INTRAMUSCULAR | Status: DC
  Filled 2017-05-26: qty 1

## 2017-05-26 MED ORDER — TRANEXAMIC ACID (OHS) BOLUS VIA INFUSION
15.0000 mg/kg | INTRAVENOUS | Status: DC
  Filled 2017-05-26: qty 1245

## 2017-05-26 MED ORDER — SUCCINYLCHOLINE CHLORIDE 200 MG/10ML IV SOSY
PREFILLED_SYRINGE | INTRAVENOUS | Status: AC
  Filled 2017-05-26: qty 10

## 2017-05-26 MED ORDER — SODIUM CHLORIDE 0.9 % IV SOLN
INTRAVENOUS | Status: DC
  Filled 2017-05-26: qty 1

## 2017-05-26 MED ORDER — PHENYLEPHRINE HCL 10 MG/ML IJ SOLN
INTRAMUSCULAR | Status: DC | PRN
  Administered 2017-05-26: 80 ug via INTRAVENOUS

## 2017-05-26 MED ORDER — LACTATED RINGERS IV SOLN
INTRAVENOUS | Status: DC
  Administered 2017-05-26: 20 mL/h via INTRAVENOUS

## 2017-05-26 MED ORDER — HEPARIN (PORCINE) IN NACL 2-0.9 UNIT/ML-% IJ SOLN
INTRAMUSCULAR | Status: AC | PRN
  Administered 2017-05-26: 1000 mL

## 2017-05-26 MED ORDER — SODIUM CHLORIDE 0.9 % IJ SOLN
OROMUCOSAL | Status: DC | PRN
  Administered 2017-05-26 (×3): via TOPICAL

## 2017-05-26 MED ORDER — SODIUM CHLORIDE 0.9 % IV SOLN: 250.0000 mL | INTRAVENOUS | Status: DC

## 2017-05-26 MED ORDER — DOPAMINE-DEXTROSE 3.2-5 MG/ML-% IV SOLN: 0.0000 ug/kg/min | INTRAVENOUS | Status: DC

## 2017-05-26 MED ORDER — THROMBIN 20000 UNITS EX SOLR
CUTANEOUS | Status: DC | PRN
  Administered 2017-05-26: 20000 [IU] via TOPICAL

## 2017-05-26 MED ORDER — DOCUSATE SODIUM 100 MG PO CAPS
200.0000 mg | ORAL_CAPSULE | Freq: Every day | ORAL | Status: DC
  Administered 2017-05-27 – 2017-05-30 (×4): 200 mg via ORAL
  Filled 2017-05-26 (×4): qty 2

## 2017-05-26 MED ORDER — SODIUM CHLORIDE 0.9 % IV SOLN
INTRAVENOUS | Status: DC
  Administered 2017-05-26: 1 [IU]/h via INTRAVENOUS
  Filled 2017-05-26: qty 1

## 2017-05-26 MED ORDER — TRANEXAMIC ACID 1000 MG/10ML IV SOLN
1.5000 mg/kg/h | INTRAVENOUS | Status: DC
  Filled 2017-05-26: qty 25

## 2017-05-26 MED ORDER — TEMAZEPAM 15 MG PO CAPS: 15.0000 mg | ORAL_CAPSULE | Freq: Once | ORAL | Status: DC | PRN

## 2017-05-26 MED ORDER — LEVALBUTEROL HCL 0.63 MG/3ML IN NEBU
0.6300 mg | INHALATION_SOLUTION | Freq: Three times a day (TID) | RESPIRATORY_TRACT | Status: DC
  Administered 2017-05-26 – 2017-06-02 (×21): 0.63 mg via RESPIRATORY_TRACT
  Filled 2017-05-26 (×23): qty 3

## 2017-05-26 MED ORDER — HEPARIN SODIUM (PORCINE) 1000 UNIT/ML IJ SOLN
INTRAMUSCULAR | Status: AC
  Filled 2017-05-26: qty 1

## 2017-05-26 MED ORDER — DOPAMINE-DEXTROSE 3.2-5 MG/ML-% IV SOLN
0.0000 ug/kg/min | INTRAVENOUS | Status: DC
Start: 2017-05-27 — End: 2017-05-26
  Filled 2017-05-26: qty 250

## 2017-05-26 MED ORDER — DEXTROSE 5 % IV SOLN
750.0000 mg | INTRAVENOUS | Status: AC
  Filled 2017-05-26: qty 750

## 2017-05-26 MED ORDER — LIDOCAINE 2% (20 MG/ML) 5 ML SYRINGE
INTRAMUSCULAR | Status: AC
  Filled 2017-05-26: qty 5

## 2017-05-26 MED ORDER — PROTAMINE SULFATE 10 MG/ML IV SOLN
INTRAVENOUS | Status: DC | PRN
  Administered 2017-05-26: 20 mg via INTRAVENOUS
  Administered 2017-05-26: 230 mg via INTRAVENOUS

## 2017-05-26 MED ORDER — MIDAZOLAM HCL 10 MG/2ML IJ SOLN
INTRAMUSCULAR | Status: AC
  Filled 2017-05-26: qty 2

## 2017-05-26 MED ORDER — FAMOTIDINE 20 MG PO TABS
20.0000 mg | ORAL_TABLET | Freq: Every day | ORAL | Status: DC
  Administered 2017-05-26: 20 mg via ORAL
  Filled 2017-05-26: qty 1

## 2017-05-26 MED ORDER — BISACODYL 5 MG PO TBEC: 5.0000 mg | DELAYED_RELEASE_TABLET | Freq: Once | ORAL | Status: DC

## 2017-05-26 MED ORDER — EPINEPHRINE PF 1 MG/ML IJ SOLN
0.0000 ug/min | INTRAVENOUS | Status: DC
  Filled 2017-05-26: qty 4

## 2017-05-26 MED ORDER — PLASMA-LYTE 148 IV SOLN
INTRAVENOUS | Status: DC
  Filled 2017-05-26: qty 2.5

## 2017-05-26 MED ORDER — MAGNESIUM SULFATE 4 GM/100ML IV SOLN
INTRAVENOUS | Status: AC
  Administered 2017-05-26: 4 g
  Filled 2017-05-26: qty 100

## 2017-05-26 MED ORDER — PROPOFOL 1000 MG/100ML IV EMUL
INTRAVENOUS | Status: AC
  Filled 2017-05-26: qty 200

## 2017-05-26 MED ORDER — ROCURONIUM BROMIDE 10 MG/ML (PF) SYRINGE
PREFILLED_SYRINGE | INTRAVENOUS | Status: DC | PRN
  Administered 2017-05-26 (×3): 50 mg via INTRAVENOUS

## 2017-05-26 MED ORDER — METOPROLOL TARTRATE 25 MG PO TABS
25.0000 mg | ORAL_TABLET | Freq: Two times a day (BID) | ORAL | Status: DC
  Filled 2017-05-26: qty 1

## 2017-05-26 MED ORDER — 0.9 % SODIUM CHLORIDE (POUR BTL) OPTIME
TOPICAL | Status: DC | PRN
  Administered 2017-05-26: 6000 mL

## 2017-05-26 MED ORDER — DEXMEDETOMIDINE HCL IN NACL 400 MCG/100ML IV SOLN
0.1000 ug/kg/h | INTRAVENOUS | Status: DC
  Filled 2017-05-26: qty 100

## 2017-05-26 MED ORDER — METOPROLOL TARTRATE 12.5 MG HALF TABLET
12.5000 mg | ORAL_TABLET | Freq: Two times a day (BID) | ORAL | Status: DC
  Administered 2017-05-27 – 2017-06-01 (×11): 12.5 mg via ORAL
  Filled 2017-05-26 (×11): qty 1

## 2017-05-26 MED ORDER — ALBUMIN HUMAN 5 % IV SOLN
250.0000 mL | INTRAVENOUS | Status: AC | PRN
  Administered 2017-05-26 (×2): 250 mL via INTRAVENOUS
  Filled 2017-05-26: qty 250

## 2017-05-26 MED ORDER — ARTIFICIAL TEARS OPHTHALMIC OINT
TOPICAL_OINTMENT | OPHTHALMIC | Status: DC | PRN
  Administered 2017-05-26: 1 via OPHTHALMIC

## 2017-05-26 MED ORDER — DEXTROSE 5 % IV SOLN
750.0000 mg | INTRAVENOUS | Status: DC
  Filled 2017-05-26: qty 750

## 2017-05-26 MED ORDER — FENTANYL CITRATE (PF) 250 MCG/5ML IJ SOLN
INTRAMUSCULAR | Status: AC
  Filled 2017-05-26: qty 25

## 2017-05-26 MED ORDER — NITROGLYCERIN IN D5W 200-5 MCG/ML-% IV SOLN: 0.0000 ug/min | INTRAVENOUS | Status: DC

## 2017-05-26 MED ORDER — SODIUM CHLORIDE 0.9 % IV SOLN
30.0000 ug/min | INTRAVENOUS | Status: DC
  Filled 2017-05-26: qty 2

## 2017-05-26 MED ORDER — MIDAZOLAM HCL 2 MG/2ML IJ SOLN
INTRAMUSCULAR | Status: AC
  Filled 2017-05-26: qty 2

## 2017-05-26 MED ORDER — MIDAZOLAM HCL 5 MG/5ML IJ SOLN
INTRAMUSCULAR | Status: DC | PRN
  Administered 2017-05-26: 1 mg via INTRAVENOUS
  Administered 2017-05-26: 3 mg via INTRAVENOUS
  Administered 2017-05-26 (×2): 2 mg via INTRAVENOUS
  Administered 2017-05-26 (×2): 1 mg via INTRAVENOUS
  Administered 2017-05-26: 2 mg via INTRAVENOUS

## 2017-05-26 MED ORDER — LEVOFLOXACIN IN D5W 750 MG/150ML IV SOLN
750.0000 mg | INTRAVENOUS | Status: AC
  Administered 2017-05-27: 750 mg via INTRAVENOUS
  Filled 2017-05-26: qty 150

## 2017-05-26 MED ORDER — BISACODYL 10 MG RE SUPP: 10.0000 mg | Freq: Every day | RECTAL | Status: DC

## 2017-05-26 MED ORDER — PANTOPRAZOLE SODIUM 40 MG PO TBEC
40.0000 mg | DELAYED_RELEASE_TABLET | Freq: Every day | ORAL | Status: DC
  Administered 2017-05-28 – 2017-06-01 (×5): 40 mg via ORAL
  Filled 2017-05-26 (×5): qty 1

## 2017-05-26 MED ORDER — PANTOPRAZOLE SODIUM 40 MG PO TBEC
40.0000 mg | DELAYED_RELEASE_TABLET | Freq: Every day | ORAL | Status: DC
Start: 2017-05-26 — End: 2017-05-26

## 2017-05-26 MED ORDER — CHLORHEXIDINE GLUCONATE 0.12% ORAL RINSE (MEDLINE KIT)
15.0000 mL | Freq: Two times a day (BID) | OROMUCOSAL | Status: DC
  Administered 2017-05-27: 15 mL via OROMUCOSAL

## 2017-05-26 MED ORDER — THROMBIN 20000 UNITS EX SOLR
CUTANEOUS | Status: AC
  Filled 2017-05-26: qty 20000

## 2017-05-26 MED ORDER — INSULIN REGULAR BOLUS VIA INFUSION
0.0000 [IU] | Freq: Three times a day (TID) | INTRAVENOUS | Status: DC
  Filled 2017-05-26: qty 10

## 2017-05-26 MED ORDER — HEPARIN SODIUM (PORCINE) 1000 UNIT/ML IJ SOLN
INTRAMUSCULAR | Status: DC | PRN
  Administered 2017-05-26: 4500 [IU] via INTRAVENOUS

## 2017-05-26 MED ORDER — IOPAMIDOL (ISOVUE-370) INJECTION 76%
INTRAVENOUS | Status: DC | PRN
  Administered 2017-05-26: 120 mL via INTRA_ARTERIAL

## 2017-05-26 MED ORDER — SODIUM CHLORIDE 0.45 % IV SOLN
INTRAVENOUS | Status: DC | PRN
  Administered 2017-05-26: 20 mL/h via INTRAVENOUS

## 2017-05-26 MED ORDER — FENTANYL CITRATE (PF) 250 MCG/5ML IJ SOLN
INTRAMUSCULAR | Status: DC | PRN
  Administered 2017-05-26: 100 ug via INTRAVENOUS
  Administered 2017-05-26: 150 ug via INTRAVENOUS
  Administered 2017-05-26: 650 ug via INTRAVENOUS
  Administered 2017-05-26 (×4): 100 ug via INTRAVENOUS
  Administered 2017-05-26: 50 ug via INTRAVENOUS
  Administered 2017-05-26: 100 ug via INTRAVENOUS
  Administered 2017-05-26: 50 ug via INTRAVENOUS

## 2017-05-26 MED ORDER — FAMOTIDINE IN NACL 20-0.9 MG/50ML-% IV SOLN
20.0000 mg | Freq: Two times a day (BID) | INTRAVENOUS | Status: AC
  Administered 2017-05-26 – 2017-05-27 (×2): 20 mg via INTRAVENOUS
  Filled 2017-05-26: qty 50

## 2017-05-26 MED ORDER — PLASMA-LYTE 148 IV SOLN
INTRAVENOUS | Status: DC | PRN
  Administered 2017-05-26: 13:00:00 via INTRAVASCULAR

## 2017-05-26 MED ORDER — SODIUM CHLORIDE 0.9 % IV SOLN: 250.0000 mL | INTRAVENOUS | Status: DC | PRN

## 2017-05-26 MED ORDER — MORPHINE SULFATE (PF) 4 MG/ML IV SOLN: 1.0000 mg | INTRAVENOUS | Status: DC | PRN

## 2017-05-26 MED ORDER — DOPAMINE-DEXTROSE 3.2-5 MG/ML-% IV SOLN
0.0000 ug/kg/min | INTRAVENOUS | Status: DC
  Filled 2017-05-26 (×2): qty 250

## 2017-05-26 SURGICAL SUPPLY — 83 items
BAG DECANTER FOR FLEXI CONT (MISCELLANEOUS) ×3 IMPLANT
BANDAGE ACE 4X5 VEL STRL LF (GAUZE/BANDAGES/DRESSINGS) ×3 IMPLANT
BANDAGE ACE 6X5 VEL STRL LF (GAUZE/BANDAGES/DRESSINGS) ×3 IMPLANT
BLADE STERNUM SYSTEM 6 (BLADE) ×3 IMPLANT
BLADE SURG 11 STRL SS (BLADE) ×3 IMPLANT
BNDG GAUZE ELAST 4 BULKY (GAUZE/BANDAGES/DRESSINGS) ×3 IMPLANT
CANISTER SUCT 3000ML PPV (MISCELLANEOUS) ×3 IMPLANT
CATH CPB KIT GERHARDT (MISCELLANEOUS) ×3 IMPLANT
CATH THORACIC 28FR (CATHETERS) ×3 IMPLANT
CRADLE DONUT ADULT HEAD (MISCELLANEOUS) ×3 IMPLANT
DERMABOND ADVANCED (GAUZE/BANDAGES/DRESSINGS) ×1
DERMABOND ADVANCED .7 DNX12 (GAUZE/BANDAGES/DRESSINGS) ×2 IMPLANT
DRAIN CHANNEL 28F RND 3/8 FF (WOUND CARE) ×3 IMPLANT
DRAPE CARDIOVASCULAR INCISE (DRAPES) ×1
DRAPE SLUSH/WARMER DISC (DRAPES) ×3 IMPLANT
DRAPE SRG 135X102X78XABS (DRAPES) ×2 IMPLANT
DRSG AQUACEL AG ADV 3.5X14 (GAUZE/BANDAGES/DRESSINGS) ×3 IMPLANT
ELECT BLADE 4.0 EZ CLEAN MEGAD (MISCELLANEOUS) ×3
ELECT REM PT RETURN 9FT ADLT (ELECTROSURGICAL) ×6
ELECTRODE BLDE 4.0 EZ CLN MEGD (MISCELLANEOUS) ×2 IMPLANT
ELECTRODE REM PT RTRN 9FT ADLT (ELECTROSURGICAL) ×4 IMPLANT
FELT TEFLON 1X6 (MISCELLANEOUS) ×3 IMPLANT
GAUZE SPONGE 4X4 12PLY STRL (GAUZE/BANDAGES/DRESSINGS) ×6 IMPLANT
GLOVE BIO SURGEON STRL SZ 6.5 (GLOVE) ×9 IMPLANT
GOWN STRL REUS W/ TWL LRG LVL3 (GOWN DISPOSABLE) ×16 IMPLANT
GOWN STRL REUS W/TWL LRG LVL3 (GOWN DISPOSABLE) ×8
HEMOSTAT POWDER SURGIFOAM 1G (HEMOSTASIS) ×9 IMPLANT
HEMOSTAT SURGICEL 2X14 (HEMOSTASIS) ×3 IMPLANT
KIT BASIN OR (CUSTOM PROCEDURE TRAY) ×3 IMPLANT
KIT CATH SUCT 8FR (CATHETERS) ×3 IMPLANT
KIT ROOM TURNOVER OR (KITS) ×3 IMPLANT
KIT SUCTION CATH 14FR (SUCTIONS) ×6 IMPLANT
KIT VASOVIEW HEMOPRO VH 3000 (KITS) ×3 IMPLANT
LEAD PACING MYOCARDI (MISCELLANEOUS) ×3 IMPLANT
MARKER GRAFT CORONARY BYPASS (MISCELLANEOUS) ×9 IMPLANT
NS IRRIG 1000ML POUR BTL (IV SOLUTION) ×15 IMPLANT
PACK OPEN HEART (CUSTOM PROCEDURE TRAY) ×3 IMPLANT
PAD ARMBOARD 7.5X6 YLW CONV (MISCELLANEOUS) ×6 IMPLANT
PAD ELECT DEFIB RADIOL ZOLL (MISCELLANEOUS) ×3 IMPLANT
PENCIL BUTTON HOLSTER BLD 10FT (ELECTRODE) ×3 IMPLANT
PUNCH AORTIC ROT 4.0MM RCL 40 (MISCELLANEOUS) ×3 IMPLANT
SENSOR MYOCARDIAL TEMP (MISCELLANEOUS) ×3 IMPLANT
SET CARDIOPLEGIA MPS 5001102 (MISCELLANEOUS) ×3 IMPLANT
SOLUTION ANTI FOG 6CC (MISCELLANEOUS) ×3 IMPLANT
SUT BONE WAX W31G (SUTURE) ×3 IMPLANT
SUT ETHIBOND 2 0 SH (SUTURE) ×3
SUT ETHIBOND 2 0 SH 36X2 (SUTURE) ×6 IMPLANT
SUT ETHILON 3 0 FSL (SUTURE) ×3 IMPLANT
SUT MNCRL AB 4-0 PS2 18 (SUTURE) ×3 IMPLANT
SUT PROLENE 3 0 SH1 36 (SUTURE) ×3 IMPLANT
SUT PROLENE 4 0 RB 1 (SUTURE) ×1
SUT PROLENE 4 0 TF (SUTURE) ×6 IMPLANT
SUT PROLENE 4-0 RB1 .5 CRCL 36 (SUTURE) ×2 IMPLANT
SUT PROLENE 5 0 C 1 36 (SUTURE) ×12 IMPLANT
SUT PROLENE 6 0 C 1 30 (SUTURE) ×12 IMPLANT
SUT PROLENE 6 0 CC (SUTURE) ×9 IMPLANT
SUT PROLENE 7 0 BV1 MDA (SUTURE) ×6 IMPLANT
SUT PROLENE 8 0 BV175 6 (SUTURE) ×18 IMPLANT
SUT SILK  1 MH (SUTURE) ×3
SUT SILK 1 MH (SUTURE) ×6 IMPLANT
SUT SILK 1 TIES 10X30 (SUTURE) ×3 IMPLANT
SUT SILK 2 0 SH CR/8 (SUTURE) ×6 IMPLANT
SUT SILK 2 0 TIES 10X30 (SUTURE) ×6 IMPLANT
SUT SILK 2 0 TIES 17X18 (SUTURE) ×1
SUT SILK 2-0 18XBRD TIE BLK (SUTURE) ×2 IMPLANT
SUT SILK 3 0 SH CR/8 (SUTURE) ×3 IMPLANT
SUT SILK 4 0 TIE 10X30 (SUTURE) ×6 IMPLANT
SUT STEEL 6MS V (SUTURE) ×3 IMPLANT
SUT STEEL SZ 6 DBL 3X14 BALL (SUTURE) ×3 IMPLANT
SUT TEM PAC WIRE 2 0 SH (SUTURE) ×12 IMPLANT
SUT VIC AB 1 CTX 18 (SUTURE) ×6 IMPLANT
SUT VIC AB 2-0 CT1 36 (SUTURE) ×3 IMPLANT
SUT VIC AB 2-0 CTX 27 (SUTURE) ×6 IMPLANT
SUT VIC AB 3-0 X1 27 (SUTURE) ×6 IMPLANT
SYSTEM SAHARA CHEST DRAIN ATS (WOUND CARE) ×3 IMPLANT
TAPE CLOTH SURG 4X10 WHT LF (GAUZE/BANDAGES/DRESSINGS) ×6 IMPLANT
TAPE PAPER 2X10 WHT MICROPORE (GAUZE/BANDAGES/DRESSINGS) ×3 IMPLANT
TOWEL GREEN STERILE (TOWEL DISPOSABLE) ×3 IMPLANT
TOWEL GREEN STERILE FF (TOWEL DISPOSABLE) ×6 IMPLANT
TRAY FOLEY SILVER 16FR TEMP (SET/KITS/TRAYS/PACK) ×3 IMPLANT
TUBING INSUFFLATION (TUBING) ×3 IMPLANT
UNDERPAD 30X30 (UNDERPADS AND DIAPERS) ×3 IMPLANT
WATER STERILE IRR 1000ML POUR (IV SOLUTION) ×6 IMPLANT

## 2017-05-26 SURGICAL SUPPLY — 14 items
CATH EXPO 5F FL3.5 (CATHETERS) ×2 IMPLANT
CATH INFINITI JR4 5F (CATHETERS) ×2 IMPLANT
CATH LAUNCHER 5F EBU3.0 (CATHETERS) ×1 IMPLANT
CATHETER LAUNCHER 5F EBU3.0 (CATHETERS) ×2
COVER PRB 48X5XTLSCP FOLD TPE (BAG) ×1 IMPLANT
COVER PROBE 5X48 (BAG) ×1
DEVICE RAD COMP TR BAND LRG (VASCULAR PRODUCTS) ×2 IMPLANT
GLIDESHEATH SLEND A-KIT 6F 22G (SHEATH) ×2 IMPLANT
GUIDEWIRE INQWIRE 1.5J.035X260 (WIRE) ×1 IMPLANT
INQWIRE 1.5J .035X260CM (WIRE) ×2
KIT HEART LEFT (KITS) ×2 IMPLANT
PACK CARDIAC CATHETERIZATION (CUSTOM PROCEDURE TRAY) ×2 IMPLANT
TRANSDUCER W/STOPCOCK (MISCELLANEOUS) ×2 IMPLANT
TUBING CIL FLEX 10 FLL-RA (TUBING) ×2 IMPLANT

## 2017-05-26 NOTE — Transfer of Care (Signed)
Immediate Anesthesia Transfer of Care Note  Patient: Bianca [name]  Procedure(s) Performed: CORONARY ARTERY BYPASS GRAFTING (CABG), ON PUMP, TIMES Three, using left internal mammary artery and right greater saphenous vein harvested endoscopically (N/A Chest) TRANSESOPHAGEAL ECHOCARDIOGRAM (TEE) (N/A )  Patient Location:SICU  Anesthesia Type:General  Level of Consciousness: sedated and Patient remains intubated per anesthesia plan  Airway & Oxygen Therapy: Patient remains intubated per anesthesia plan and Patient placed on Ventilator (see vital sign flow sheet for setting)  Post-op Assessment: Report given to RN and Post -op Vital signs reviewed and stable  Post vital signs: Reviewed and stable  Last Vitals:  Vitals:   05/26/17 1325 05/26/17 1326  BP: (!) 118/58   Pulse: 80 83  Resp: 18 16  Temp:    SpO2: 98% 98%    Last Pain:  Vitals:   05/26/17 1122  TempSrc:   PainSc: 5          Complications: No apparent anesthesia complications

## 2017-05-26 NOTE — OR Nursing (Signed)
Forty five minute call to SICU charge nurse at 1742. Spoke to [place].

## 2017-05-26 NOTE — OR Nursing (Signed)
Twenty minute call to SICU charge nurse at 1812. Spoke to [place].

## 2017-05-26 NOTE — Anesthesia Procedure Notes (Signed)
Arterial Line Insertion Start/End10/04/2017 1:10 PM, 05/26/2017 1:10 PM Performed by: Rush Farmer E  Patient location: Pre-op. Preanesthetic checklist: patient identified, IV checked, site marked, risks and benefits discussed, surgical consent, monitors and equipment checked, pre-op evaluation, timeout performed and anesthesia consent Lidocaine 1% used for infiltration Left, radial was placed Catheter size: 20 Fr Hand hygiene performed  and maximum sterile barriers used   Attempts: 1 Procedure performed without using ultrasound guided technique. Following insertion, dressing applied. Post procedure assessment: normal and unchanged  Patient tolerated the procedure well with no immediate complications.

## 2017-05-26 NOTE — Progress Notes (Signed)
Patient ID: Bianca [name], female   DOB: 29-Nov-1945, 71 y.o.   MRN: [id number]      [name]       [name]             [id number]        Bianca [name] #[id number] Date of Birth: 1945/11/28  Referring: Dr Tamala Julian Primary Care: [name], NP  Chief Complaint:    Chief Complaint  Patient presents with  . Chest Pain    History of Present Illness:     Patient with known CAD previous stents in the RCA, she has had 3 weeks of increasing burning pain in chest  And increasing sob yesterday . Has been in the ER since yesterday afternoon about 4 pm when she came in with prolonged chest pain. Now this am has had continued episodic chest pain and went from ER to cath lab for cath . Dr Tamala Julian requested emergency CABG be considered. Patient has been on plavix but no ASA.  Smokes , does not have DM .   Current Activity/ Functional Status: Patient is independent with mobility/ambulation, transfers, ADL's, IADL's.   Zubrod Score: At the time of surgery this patient's most appropriate activity status/level should be described as: []     0    Normal activity, no symptoms [x]     1    Restricted in physical strenuous activity but ambulatory, able to do out light work []     2    Ambulatory and capable of self care, unable to do work activities, up and about                 more than 50%  Of the time                            []     3    Only limited self care, in bed greater than 50% of waking hours []     4    Completely disabled, no self care, confined to bed or chair []     5    Moribund  Past Medical History:  Diagnosis Date  . Arthritis   . Blood transfusion without reported diagnosis   . CAD (coronary artery disease)    [name]. 1997 MI/PCI RCA;  b. 2007 MI/PCI of 100% RCA with Taxus DES x 3 placed, EF 55%;  c. 2010 Cath: stable anatomy;  d. 05/2012 NSTEMI/Cath/PCI: LM nl, LAD 60-59m, D1 20ost, LCX 66m, RCA 40-4m ISR, 60/95d (Treated w/  2.75x33 Xience Xpedition DES), PDA 66m (Treated w/ PTCA), EF 60%.  . Carpal tunnel syndrome on both sides   . Chronic lower back pain   . Depression   . GERD (gastroesophageal reflux disease)   . Hyperlipidemia   . Myocardial infarction [facility]) 1997, 2007, 2013  . Numbness of foot    Left foot - back surgery 2009    Past Surgical History:  Procedure Laterality Date  . ANKLE SURGERY  Years ago   Left; "had to take out [name] floater"  . CARPAL TUNNEL RELEASE Left 10/12016  . CESAREAN SECTION  1990  . CORONARY ANGIOPLASTY WITH STENT PLACEMENT  1997; 2007, 2013   "2 + 2; + 1= total of 5  . LEFT HEART CATHETERIZATION WITH CORONARY ANGIOGRAM N/[name] 05/18/2012   Procedure: LEFT HEART CATHETERIZATION WITH CORONARY ANGIOGRAM;  Surgeon: Wellington Hampshire, MD;  Location: [facility]ATH [facility];  Service: Cardiovascular;  Laterality: N/[name];  . [place]; 2009  . TOTAL HIP ARTHROPLASTY Right 05/24/2014   Procedure: RIGHT TOTAL HIP ARTHROPLASTY ANTERIOR APPROACH;  Surgeon: Gearlean Alf, MD;  Location: [facility] OR[facility];  Service: Orthopedics;  Laterality: Right;    History  Smoking Status  . Light Tobacco Smoker  . Packs/day: 0.50  . Years: 50.00  . Types: Cigarettes  Smokeless Tobacco  . Never Used    Comment: smokes [name] half pack [name] day    History  Alcohol Use No    Social History   Social History  . Marital status: Married    Spouse name: N/[name]  . Number of children: N/[name]  . Years of education: N/[name]   Occupational History  . Not on file.   Social History Main Topics  . Smoking status: Light Tobacco Smoker    Packs/day: 0.50    Years: 50.00    Types: Cigarettes  . Smokeless tobacco: Never Used     Comment: smokes [name] half pack [name] day  . Alcohol use No  . Drug use: No  . Sexual activity: Not Currently    Birth control/ protection: Post-menopausal   Other Topics Concern  . Not on file   Social History Narrative   Is [name] homemaker   Married for 59 years    Has [name] daughter and son      Pets: Two dogs and [name] Neurosurgeon   Likes to garden ( flower beds).           Allergies  Allergen Reactions  . Oxycodone Hcl Itching and Other (See Comments)    Confusion, hallucinations  . Penicillins Itching and Rash    Has patient had [name] PCN reaction causing immediate rash, facial/tongue/throat swelling, SOB or lightheadedness with hypotension: yes rash that took [name] while to go away across the abdomen Has patient had [name] PCN reaction causing severe rash involving mucus membranes or skin necrosis: no Has patient had [name] PCN reaction that required hospitalization: no Has patient had [name] PCN reaction occurring within the last 10 years: No If all of the above answers are "NO", then may proceed with Cephalosporin use.   . Aspirin Nausea And Vomiting and Other (See Comments)    Burning in stomach  . Macrobid [Nitrofurantoin Monohyd Macro] Nausea And Vomiting  . Sulfa Antibiotics Nausea Only  . Tramadol Itching and Other (See Comments)    hallucinations    Current Facility-Administered Medications  Medication Dose Route Frequency Provider Last Rate Last Dose  . 0.9 %  sodium chloride infusion   Intravenous Continuous Dorie Rank, MD 125 mL/hr at 05/25/17 1654 125 mL/hr at 05/25/17 1654  . 0.9 %  sodium chloride infusion  250 mL Intravenous PRN Jettie Booze E, NP      . 0.9 %  sodium chloride infusion   Intravenous Continuous Jettie Booze E, NP 75 mL/hr at 05/26/17 0710    . [MAR Hold] acetaminophen (TYLENOL) tablet 650 mg  650 mg Oral Q4H PRN Arbutus Leas, NP      . Doug Sou Hold] acetaminophen (TYLENOL) tablet 650 mg  650 mg Oral Q6H PRN Arbutus Leas, NP      . Doug Sou Hold] atorvastatin (LIPITOR) tablet 40 mg  40 mg Oral q1800 Arbutus Leas, NP      . Doug Sou Hold] buPROPion (WELLBUTRIN XL) 24 hr tablet 150 mg  150 mg Oral Daily Jettie Booze E, NP      . cefUROXime (  ZINACEF) 1.5 g in dextrose 5 % 50 mL IVPB  1.5 g Intravenous To OR [name], MD      . cefUROXime (ZINACEF) 750 mg in dextrose 5 % 50  mL IVPB  750 mg Intravenous To OR [name], MD      . dexmedetomidine (PRECEDEX) 400 MCG/100ML (4 mcg/mL) infusion  0.1-0.7 mcg/kg/hr Intravenous To OR [name], MD      . DOPamine (INTROPIN) 800 mg in dextrose 5 % 250 mL (3.2 mg/mL) infusion  0-10 mcg/kg/min Intravenous To OR [name], MD      . EPINEPHrine (ADRENALIN) 4 mg in dextrose 5 % 250 mL (0.016 mg/mL) infusion  0-10 mcg/min Intravenous To OR [name], MD      . Doug Sou Hold] escitalopram (LEXAPRO) tablet 20 mg  20 mg Oral Daily Arbutus Leas, NP      . Doug Sou Hold] famotidine (PEPCID) tablet 20 mg  20 mg Oral QHS Jettie Booze E, NP   20 mg at 05/26/17 0220  . heparin 2,500 Units, papaverine 30 mg in electrolyte-148 (PLASMALYTE-148) 500 mL irrigation   Irrigation To OR [name], MD      . heparin 30,000 units/NS 1000 mL solution for CELLSAVER   Other To OR [name], MD      . heparin ADULT infusion 100 units/mL (25000 units/273mL sodium chloride 0.45%)  800 Units/hr Intravenous Continuous Lavenia Atlas, RPH 8 mL/hr at 05/26/17 0618 800 Units/hr at 05/26/17 0618  . [MAR Hold] hydrOXYzine (ATARAX/VISTARIL) tablet 25 mg  25 mg Oral QHS Smith, Erin E, NP      . insulin regular (NOVOLIN R,HUMULIN R) 100 Units in sodium chloride 0.9 % 100 mL (1 Units/mL) infusion   Intravenous To OR [name], MD      . magnesium sulfate (IV Push/IM) injection 40 mEq  40 mEq Other To OR [name], MD      . Doug Sou Hold] meclizine (ANTIVERT) tablet 25 mg  25 mg Oral BID Arbutus Leas, NP      . Doug Sou Hold] metoprolol tartrate (LOPRESSOR) tablet 25 mg  25 mg Oral BID Arbutus Leas, NP      . Doug Sou Hold] morphine 4 MG/ML injection 2 mg  2 mg Intravenous Q2H PRN Jettie Booze E, NP   2 mg at 05/26/17 0251  . [MAR Hold] nitroGLYCERIN (NITROSTAT) SL tablet 0.4 mg  0.4 mg Sublingual Q5 min PRN Arbutus Leas, NP      . Doug Sou Hold] nitroGLYCERIN (NITROSTAT) SL tablet 0.4 mg  0.4 mg Sublingual Q5 Min x  3 PRN Arbutus Leas, NP      . Doug Sou Hold] nitroGLYCERIN 50 mg in dextrose 5 % 250 mL (0.2 mg/mL) infusion  0-200 mcg/min Intravenous Titrated Jettie Booze E, NP 6 mL/hr at 05/26/17 1113 20 mcg/min at 05/26/17 1113  . nitroGLYCERIN 50 mg in dextrose 5 % 250 mL (0.2 mg/mL) infusion  2-200 mcg/min Intravenous To OR [name], MD      . Doug Sou Hold] ondansetron [name]) injection 4 mg  4 mg Intravenous Q6H PRN Arbutus Leas, NP      . Doug Sou Hold] pantoprazole (PROTONIX) EC tablet 40 mg  40 mg Oral Daily Jettie Booze E, NP      . Doug Sou Hold] pentosan polysulfate (ELMIRON) capsule 200 mg  200 mg Oral BID Arbutus Leas, NP   Stopped at 05/26/17 [phone]  . phenylephrine (  NEO-SYNEPHRINE) 20 mg in sodium chloride 0.9 % 250 mL (0.08 mg/mL) infusion  30-200 mcg/min Intravenous To OR [name], MD      . potassium chloride injection 80 mEq  80 mEq Other To OR [name], MD      . sodium chloride flush (NS) 0.9 % injection 3 mL  3 mL Intravenous Q12H Jettie Booze E, NP   3 mL at 05/26/17 0222  . sodium chloride flush (NS) 0.9 % injection 3 mL  3 mL Intravenous PRN Jettie Booze E, NP      . tranexamic acid (CYKLOKAPRON) 2,500 mg in sodium chloride 0.9 % 250 mL (10 mg/mL) infusion  1.5 mg/kg/hr Intravenous To OR [name], MD      . tranexamic acid (CYKLOKAPRON) bolus via infusion - over 30 minutes 1,245 mg  15 mg/kg Intravenous To OR [name], MD      . tranexamic acid (CYKLOKAPRON) pump prime solution 166 mg  2 mg/kg Intracatheter To OR [name], MD      . vancomycin (VANCOCIN) 1,250 mg in sodium chloride 0.9 % 250 mL IVPB  1,250 mg Intravenous To OR [name], MD      . Doug Sou Hold] zolpidem (AMBIEN) tablet 5 mg  5 mg Oral QHS Jettie Booze E, NP   5 mg at 05/26/17 0220    Prescriptions Prior to Admission  Medication Sig Dispense Refill Last Dose  . baclofen (LIORESAL) 10 MG tablet Take 10 mg by mouth at bedtime.  0 05/24/2017 at pm  . buPROPion (WELLBUTRIN XL)  150 MG 24 hr tablet Take 150 mg by mouth daily.   05/24/2017 at am  . clopidogrel (PLAVIX) 75 MG tablet Take 1 tablet (75 mg total) by mouth daily. 90 tablet 3 05/24/2017 at am  . cyanocobalamin (,VITAMIN B-12,) 1000 MCG/ML injection Inject 1,000 mcg into the muscle every 30 (thirty) days. Vitamin B12   - last injection 05/22/17   05/22/2017  . dexlansoprazole (DEXILANT) 60 MG capsule Take 1 capsule (60 mg total) by mouth daily. (Patient taking differently: Take 60 mg by mouth daily as needed (heartburn). ) 30 capsule 2 05/23/2017  . escitalopram (LEXAPRO) 20 MG tablet Take 20 mg by mouth daily.  1 05/24/2017 at am  . ezetimibe (ZETIA) 10 MG tablet Take 10 mg by mouth at bedtime.   05/24/2017 at pm  . famotidine (PEPCID) 20 MG tablet Take 1 tablet (20 mg total) by mouth at bedtime. 1-2 hours before bed (Patient taking differently: Take 20 mg by mouth at bedtime. )   2 weeks ago  . fluticasone (FLONASE) 50 MCG/ACT nasal spray Place 2 sprays into both nostrils daily as needed for allergies or rhinitis.   few days ago  . fluticasone (FLOVENT HFA) 220 MCG/ACT inhaler Inhale 1 puff into the lungs 2 (two) times daily. 1 Inhaler 3 05/24/2017 at Unknown time  . gabapentin (NEURONTIN) 300 MG capsule Take 300 mg by mouth at bedtime.    05/24/2017 at pm  . hydrOXYzine (ATARAX/VISTARIL) 25 MG tablet Take 25 mg by mouth at bedtime.   05/24/2017 at pm  . meclizine (ANTIVERT) 25 MG tablet Take 1 tablet (25 mg total) by mouth 2 (two) times daily. (Patient taking differently: Take 25 mg by mouth 2 (two) times daily as needed for dizziness. ) 30 tablet 0 05/23/2017  . Methen-Hyosc-Meth Blue-Na Phos (ME/NAPHOS/MB/HYO1 PO) Take 1 tablet by mouth every 6 (six) hours as needed (vaginal burning).  05/24/2017 at pm  . metoprolol (LOPRESSOR) 50 MG tablet TAKE 1/2 TABLETS (25 MG TOTAL) BY MOUTH 2 (TWO) TIMES DAILY. (Patient taking differently: Take 25 mg by mouth 2 (two) times daily. ) 90 tablet 0 05/24/2017 at 2300  . Multiple Vitamin  (MULTIVITAMIN WITH MINERALS) TABS tablet Take 1 tablet by mouth daily.   few days ago at Unknown time  . NITROSTAT 0.4 MG SL tablet PLACE 1 TABLET (0.4 MG TOTAL) UNDER THE TONGUE EVERY 5 (FIVE) MINUTES AS NEEDED FOR CHEST PAIN. 25 tablet 0 05/25/2017 at 1400  . pentosan polysulfate (ELMIRON) 100 MG capsule Take 200 mg by mouth 2 (two) times daily. For pain   05/24/2017 at pm  . polyvinyl alcohol (ARTIFICIAL TEARS) 1.4 % ophthalmic solution Place 1 drop into both eyes 2 (two) times daily as needed for dry eyes.   05/24/2017 at pm  . PREMARIN vaginal cream USE 1/2 GRAM VAGINALLY EVERY NIGHT AT BEDTIME FOR THE 1ST 2 WEEKS,, THEN 1/2 GRAM 2-3 TIMES [name] WEEK (Patient taking differently: APPLY 1/2 GRAM VAGINALLY AT BEDTIME AS NEEDED FOR DRYNESS - UP TO 3 TIMES [name] WEEK) 30 g 1 couple weeks ago  . Probiotic Product (PROBIOTIC PO) Take 1 capsule by mouth at bedtime. Nature Bounty's brand   05/24/2017 at pm  . Red Yeast Rice Extract (RED YEAST RICE PO) Take 1 capsule by mouth at bedtime.   05/23/2017  . simvastatin (ZOCOR) 80 MG tablet TAKE 1 TABLET BY MOUTH AT BEDTIME (Patient taking differently: TAKE 1 TABLET (80 MG) BY MOUTH AT BEDTIME) 30 tablet 5 05/24/2017 at pm  . zolpidem (AMBIEN) 5 MG tablet Take 1 tablet (5 mg total) by mouth at bedtime. 30 tablet 0 05/24/2017 at pm    Family History  Problem Relation Age of Onset  . Cancer Paternal Grandfather        Esophageal  . Coronary artery disease Mother   . Diabetes Mother   . Hypertension Mother   . Dementia Father   . Sudden death Brother        Suicide     Review of Systems:  Pertinent items are noted in HPI.     Cardiac Review of Systems: Y or N  Chest Pain Blue.Reese    ]  Resting SOB [  n ] Exertional SOB  [ y ]  Vertell Limber Blue.Reese  ]   Pedal Edema [n   ]    Palpitations Florencio.Farrier  ] Syncope  [ n ]   Presyncope [ n  ]  General Review of Systems: [Y] = yes [  ]=no Constitional: recent weight change [  ]; anorexia [  ]; fatigue [  ]; nausea [  ]; night sweats [  ];  fever [  ]; or chills [  ]                                                               Dental: poor dentition[  ]; Last Dentist visit:   Eye : blurred vision [  ]; diplopia [   ]; vision changes [  ];  Amaurosis fugax[  ]; Resp: cough [  ];  wheezing[  ];  hemoptysis[  ]; shortness of breath[  ]; paroxysmal nocturnal dyspnea[  ]; dyspnea on exertion[  ];  or orthopnea[  ];  GI:  gallstones[  ], vomiting[  ];  dysphagia[  ]; melena[  ];  hematochezia [  ]; heartburn[  ];   Hx of  Colonoscopy[  ]; GU: kidney stones [  ]; hematuria[  ];   dysuria [  ];  nocturia[  ];  history of     obstruction [  ]; urinary frequency [  ]             Skin: rash, swelling[  ];, hair loss[  ];  peripheral edema[  ];  or itching[  ]; Musculosketetal: myalgias[  ];  joint swelling[  ];  joint erythema[  ];  joint pain[  ];  back pain[  ];  Heme/Lymph: bruising[  ];  bleeding[  ];  anemia[  ];  Neuro: Bianca[  ];  headaches[  ];  stroke[  ];  vertigo[  ];  seizures[ n ];   paresthesias[  ];  difficulty walking[n  ];  Psych:depression[  ]; anxiety[  ];  Endocrine: diabetes[ n ];  thyroid dysfunction[ n ];  Immunizations: Flu [ n ]; Pneumococcal[ n ];  Other:  Physical Exam: BP 107/75   Pulse 79   Temp 98.2 F (36.8 C)   Resp 18   Ht 5\' 1"  (1.549 m)   Wt 183 lb (83 kg)   LMP  (LMP Unknown)   SpO2 97%   BMI 34.58 kg/m    General appearance: alert, cooperative and moderate distress Head: Normocephalic, without obvious abnormality, atraumatic Neck: no adenopathy, no carotid bruit, no JVD, supple, symmetrical, trachea midline and thyroid not enlarged, symmetric, no tenderness/mass/nodules Lymph nodes: Cervical, supraclavicular, and axillary nodes normal. Resp: clear to auscultation bilaterally Back: symmetric, no curvature. ROM normal. No CVA tenderness. Cardio: regular rate and rhythm, S1, S2 normal, no murmur, click, rub or gallop GI: soft, non-tender; bowel sounds normal; no masses,  no  organomegaly Extremities: extremities normal, atraumatic, no cyanosis or edema, Homans sign is negative, no sign of DVT and bruising right hand, band on right wrist Neurologic: Grossly normal  Diagnostic Studies & Laboratory data:     Recent Radiology Findings:   Dg Chest 2 View  Result Date: 05/25/2017 CLINICAL DATA:  Chest pain. EXAM: CHEST  2 VIEW COMPARISON:  01/02/2017 FINDINGS: The cardiac silhouette, mediastinal and hilar contours are normal and stable. Stable coronary artery stent noted on the right. The lungs are clear. Stable mild eventration right hemidiaphragm. No pleural effusions. Stable cystic changes involving both humeral heads. IMPRESSION: No acute cardiopulmonary findings. Electronically Signed   By: Marijo Sanes M.D.   On: 05/25/2017 16:57   Ct Angio Chest Pe W And/or Wo Contrast  Result Date: 05/25/2017 CLINICAL DATA:  71 year old with chest pain. EXAM: CT ANGIOGRAPHY CHEST WITH CONTRAST TECHNIQUE: Multidetector CT imaging of the chest was performed using the standard protocol during bolus administration of intravenous contrast. Multiplanar CT image reconstructions and MIPs were obtained to evaluate the vascular anatomy. CONTRAST:  100 mL Isovue 370 COMPARISON:  08/16/2014 FINDINGS: Cardiovascular: Negative for pulmonary embolism. Heart size is within normal limits. Coronary artery calcifications. Normal caliber of the thoracic aorta. Atherosclerotic calcifications in the thoracic aorta. Mediastinum/Nodes: No chest lymphadenopathy. Esophagus is unremarkable. Lungs/Pleura: Trachea and mainstem bronchi are patent. No pleural effusions. The lungs are clear. Upper Abdomen: No acute abnormality. Musculoskeletal: No acute bone abnormality. Review of the MIP images confirms the above findings. IMPRESSION: No acute chest abnormality. Negative for pulmonary embolism. Coronary artery calcifications. Electronically Signed  By: Markus Daft M.D.   On: 05/25/2017 22:00     I have  independently reviewed the above radiologic studies.  Recent Lab Findings: Lab Results  Component Value Date   WBC 7.8 05/26/2017   HGB 11.1 (L) 05/26/2017   HCT 32.9 (L) 05/26/2017   PLT 225 05/26/2017   GLUCOSE 112 (H) 05/25/2017   CHOL 233 (H) 10/31/2016   TRIG 316.0 (H) 10/31/2016   HDL 48.80 10/31/2016   LDLDIRECT 129.0 10/31/2016   LDLCALC 106 (H) 01/03/2014   ALT 25 01/02/2017   AST 30 01/02/2017   NA 137 05/25/2017   K 4.1 05/25/2017   CL 101 05/25/2017   CREATININE 0.83 05/26/2017   BUN 8 05/25/2017   CO2 26 05/25/2017   TSH 1.59 01/13/2017   INR 0.96 01/02/2017   HGBA1C 5.8 02/14/2015   Lab Results  Component Value Date   CKTOTAL 97 05/18/2012   CKMB 5.3 (H) 05/18/2012   TROPONINI 0.91 ([place]) 05/26/2017   CATH: Procedures   LEFT HEART CATH AND CORONARY ANGIOGRAPHY  Conclusion    Severe three-vessel coronary disease with acute coronary syndrome/unstable angina presentation. No acute EKG changes are noted despite ongoing pain.  Total occlusion within the extensively stented right coronary. Distal vessel fills by collaterals from left to right.  Widely patent left main.  95% stenosis proximal to mid LAD distal to the first diagonal.  80% proximal to mid circumflex and 90% proximal first obtuse marginal.  Inferobasal akinesis. EF 35%. Normal LVEDP  RECOMMENDATIONS:   Acute coronary syndrome with ongoing chest discomfort in the setting of recent total occlusion of the right coronary which is now collateralized. Severe circumflex and LAD disease. Have spoken with Dr. Cyndia Bent who will try to arrange surgery for later today.  IV nitroglycerin,\ and beta blocker therapy.  Hold Plavix.      Assessment / Plan:   Unstable progressive MI  Agree with emergency CABG  The goals risks and alternatives of the planned surgical procedure Procedure(s): CABG  have been discussed with the patient in detail. The risks of the procedure including death, infection,  stroke, myocardial infarction, bleeding especiall since on Plavix , blood transfusion have all been discussed specifically.  I have quoted Bianca [name] 4 % of perioperative mortality and [name] complication rate as high as 40 %. The patient's questions have been answered.Hawa D Landgrebe is willing  to proceed with the planned procedure.    [name] MD      [address] [place] [address] Office [phone]   Beeper [phone]  05/26/2017 12:11 PM

## 2017-05-26 NOTE — ED Notes (Signed)
Page sent to cardiology to report critical troponin.

## 2017-05-26 NOTE — Progress Notes (Signed)
Progress Note  Patient Name: Bianca [name] Date of Encounter: 05/26/2017  Primary Cardiologist: Dr Harrington Challenger  Subjective   She has ongoing retrosternal pain 5/10 that was minimally relieved by sl NTG.   Inpatient Medications    Scheduled Meds: . atorvastatin  40 mg Oral q1800  . buPROPion  150 mg Oral Daily  . escitalopram  20 mg Oral Daily  . famotidine  20 mg Oral QHS  . heparin  5,000 Units Subcutaneous Q8H  . hydrOXYzine  25 mg Oral QHS  . meclizine  25 mg Oral BID  . metoprolol tartrate  25 mg Oral BID  . pantoprazole  40 mg Oral Daily  . pentosan polysulfate  200 mg Oral BID  . sodium chloride flush  3 mL Intravenous Q12H  . zolpidem  5 mg Oral QHS   Continuous Infusions: . sodium chloride 125 mL/hr (05/25/17 1654)  . sodium chloride    . sodium chloride 75 mL/hr at 05/26/17 0710  . heparin 800 Units/hr (05/26/17 0618)  . nitroGLYCERIN 10 mcg/min (05/25/17 2226)   PRN Meds: sodium chloride, acetaminophen, acetaminophen, morphine injection, nitroGLYCERIN, nitroGLYCERIN, ondansetron (ZOFRAN) IV, sodium chloride flush   Vital Signs    Vitals:   05/26/17 0600 05/26/17 0720 05/26/17 0735 05/26/17 0805  BP: 116/66 (!) 112/58 (!) 111/50 99/65  Pulse: 70 72 69 67  Resp: 15 13 14 17   Temp:      TempSrc:      SpO2: 93% 93% 91% 94%  Weight:      Height:       No intake or output data in the 24 hours ending 05/26/17 0817 Filed Weights   05/25/17 1613  Weight: 183 lb (83 kg)    Telemetry    SR - Personally Reviewed  ECG    SR, negative T waves in V1-3 - Personally Reviewed  Physical Exam   GEN: No acute distress.   Neck: No JVD Cardiac: RRR, no murmurs, rubs, or gallops.  Respiratory: Clear to auscultation bilaterally. GI: Soft, nontender, non-distended  MS: No edema; No deformity. Neuro:  Nonfocal  Psych: Normal affect   Labs    Chemistry Recent Labs Lab 05/25/17 1622 05/26/17 0226  NA 137  --   K 4.1  --   CL 101  --   CO2 26  --     GLUCOSE 112*  --   BUN 8  --   CREATININE 0.86 0.83  CALCIUM 9.5  --   GFRNONAA >60 >60  GFRAA >60 >60  ANIONGAP 10  --     Hematology Recent Labs Lab 05/25/17 1622 05/26/17 0226  WBC 7.0 7.8  RBC 4.07 3.65*  HGB 12.3 11.1*  HCT 36.9 32.9*  MCV 90.7 90.1  MCH 30.2 30.4  MCHC 33.3 33.7  RDW 12.1 12.2  PLT 217 225    Cardiac Enzymes Recent Labs Lab 05/26/17 0226  TROPONINI 0.91*    Recent Labs Lab 05/25/17 1627 05/25/17 1959  TROPIPOC 0.05 0.08    BNPNo results for input(s): BNP, PROBNP in the last 168 hours.   DDimer  Recent Labs Lab 05/25/17 1622  DDIMER 0.79*     Radiology    Dg Chest 2 View  Result Date: 05/25/2017 CLINICAL DATA:  Chest pain. EXAM: CHEST  2 VIEW COMPARISON:  01/02/2017 FINDINGS: The cardiac silhouette, mediastinal and hilar contours are normal and stable. Stable coronary artery stent noted on the right. The lungs are clear. Stable mild eventration right hemidiaphragm. No pleural  effusions. Stable cystic changes involving both humeral heads. IMPRESSION: No acute cardiopulmonary findings. Electronically Signed   By: Marijo Sanes M.D.   On: 05/25/2017 16:57   Ct Angio Chest Pe W And/or Wo Contrast  Result Date: 05/25/2017 CLINICAL DATA:  71 year old with chest pain. EXAM: CT ANGIOGRAPHY CHEST WITH CONTRAST TECHNIQUE: Multidetector CT imaging of the chest was performed using the standard protocol during bolus administration of intravenous contrast. Multiplanar CT image reconstructions and MIPs were obtained to evaluate the vascular anatomy. CONTRAST:  100 mL Isovue 370 COMPARISON:  08/16/2014 FINDINGS: Cardiovascular: Negative for pulmonary embolism. Heart size is within normal limits. Coronary artery calcifications. Normal caliber of the thoracic aorta. Atherosclerotic calcifications in the thoracic aorta. Mediastinum/Nodes: No chest lymphadenopathy. Esophagus is unremarkable. Lungs/Pleura: Trachea and mainstem bronchi are patent. No pleural  effusions. The lungs are clear. Upper Abdomen: No acute abnormality. Musculoskeletal: No acute bone abnormality. Review of the MIP images confirms the above findings. IMPRESSION: No acute chest abnormality. Negative for pulmonary embolism. Coronary artery calcifications. Electronically Signed   By: Markus Daft M.D.   On: 05/25/2017 22:00   Cardiac Studies       Patient Profile     71 y.o. female  [place]    1. NSTEMI 2. H/O opiod use 3. HTN 4. HLP  71 year old female with a past medical history of CAD s/p MI in 1997 with PCI to RCA, MI in 2007 s/p DES x 3 to RCA, HTN, and HLD. Presented to the ED on 05/25/17 with chest pain. The pain is retrosternal with radiation to neck, currently 6/10. This is the first pain in a while, but has noticed SOB in the last few months. She recently had a Lexiscan stress test that showed no ischemia. This was done as the patient reported persistent SOB with activity. She recently saw Dr. Harrington Challenger and reported the same persistent SOB. She was given an inhaler.  Last cath was in 2013 at which time she was noted to have a 60-70% LAD lesion, 50% mid circumflex lesion, multiple overlapping stents in the RCA. It was felt that the patient could ultimately require CABG with borderline significant disease in the LAD and LCx.  She quit taking dilaudid 3 months ago and would like to stay away from narcotics.   ECG shows SR, negative T waves in leads V1-3 suggestive of anterior ischemia. Troponin is positive 0.91, crea normal.   Plan is to cath this am, on Heparin drip. She is allergic to ASA, chronically on Plavix,we held yesterday in case she needs CABG, the last dose the morning of 10/7.   For questions or updates, please contact [facility] Please consult [email or web address] for contact info under Cardiology/STEMI.      Signed, Ena Dawley, MD  05/26/2017, 8:17 AM

## 2017-05-26 NOTE — Progress Notes (Addendum)
Pt in cath holding pre procedure. CP 5/10. O2 at [facility] started. Awake alert, oriented. SR at 13- no ectopy noted. To procedure room at 1010.

## 2017-05-26 NOTE — Anesthesia Procedure Notes (Signed)
Procedure Name: Intubation Date/Time: 05/26/2017 1:53 PM Performed by: Candis Shine Pre-anesthesia Checklist: Patient identified, Emergency Drugs available, Suction available and Patient being monitored Patient Re-evaluated:Patient Re-evaluated prior to induction Oxygen Delivery Method: Circle System Utilized Preoxygenation: Pre-oxygenation with 100% oxygen Induction Type: IV induction Ventilation: Two handed mask ventilation required and Oral airway inserted - appropriate to patient size Laryngoscope Size: Mac and 3 Grade View: Grade I Tube type: Oral Tube size: 8.0 mm Number of attempts: 1 Airway Equipment and Method: Stylet and Oral airway Placement Confirmation: ETT inserted through vocal cords under direct vision,  positive ETCO2 and breath sounds checked- equal and bilateral Secured at: 22 cm Tube secured with: Tape Dental Injury: Teeth and Oropharynx as per pre-operative assessment

## 2017-05-26 NOTE — Anesthesia Procedure Notes (Signed)
Central Venous Catheter Insertion Performed by: Annye Asa, anesthesiologist Start/End10/04/2017 12:38 PM, 05/26/2017 12:53 PM Patient location: Pre-op. Preanesthetic checklist: patient identified, IV checked, risks and benefits discussed, surgical consent, monitors and equipment checked, pre-op evaluation, timeout performed and anesthesia consent Position: Trendelenburg Lidocaine 1% used for infiltration and patient sedated Hand hygiene performed , maximum sterile barriers used  and Seldinger technique used Catheter size: 8.5 Fr PA cath was placed.Sheath introducer Swan type:thermodilution Procedure performed using ultrasound guided technique. Ultrasound Notes:anatomy identified, needle tip was noted to be adjacent to the nerve/plexus identified, no ultrasound evidence of intravascular and/or intraneural injection and image(s) printed for medical record Attempts: 1 Following insertion, line sutured, dressing applied and Biopatch. Post procedure assessment: blood return through all ports, free fluid flow and no air  Patient tolerated the procedure well with no immediate complications. Additional procedure comments: PA catheter:  Routine monitors. Timeout, sterile prep, drape, FBP R neck.  Trendelenburg position.  1% Lido local, finder and trocar RIJ 1st pass with US guidance.  Cordis placed over J wire. PA catheter in easily.  Sterile dressing applied.  Patient tolerated well, VSS.  Jenita Seashore, MD.

## 2017-05-26 NOTE — Brief Op Note (Addendum)
05/25/2017 - 05/26/2017  6:54 PM  PATIENT:  Bianca [name]  71 y.o. female  PRE-OPERATIVE DIAGNOSIS:  Coronary artery disease with evolving infarct   POST-OPERATIVE DIAGNOSIS:  Same   PROCEDURE:  Procedure(s):  Emergency CORONARY ARTERY BYPASS GRAFTING x 3 -LIMA to LAD -SVG to OM 1 -SVG to PDA  ENDOSCOPIC HARVEST GREATER SAPHENOUS VEIN -Right Leg  TRANSESOPHAGEAL ECHOCARDIOGRAM (TEE) (N/A)  SURGEON:  Surgeon(s) and Role:    * [name], MD - Primary  PHYSICIAN ASSISTANT: Ellwood Handler PA-C  ANESTHESIA:   general  EBL:  Total I/O In: 7517 [I.V.:2500; Blood:407; IV Piggyback:250] Out: 2215 [Urine:1165; Blood:1050]  BLOOD ADMINISTERED:CELLSAVER  DRAINS: Left pleural Chest tubes, mediastinal Chest drains   LOCAL MEDICATIONS USED:  NONE  SPECIMEN:  No Specimen  DISPOSITION OF SPECIMEN:  N/A  COUNTS:  YES   DICTATION: .Dragon Dictation  PLAN OF CARE: Admit to inpatient   PATIENT DISPOSITION:  ICU - intubated and hemodynamically stable.   Delay start of Pharmacological VTE agent (>24hrs) due to surgical blood loss or risk of bleeding: yes

## 2017-05-26 NOTE — Anesthesia Preprocedure Evaluation (Addendum)
Anesthesia Evaluation  Patient identified by MRN, date of birth, ID band Patient awake    Reviewed: Allergy & Precautions, NPO status , Patient's Chart, lab work & pertinent test results, reviewed documented beta blocker date and time   History of Anesthesia Complications Negative for: history of anesthetic complications  Airway Mallampati: II  TM Distance: >3 FB Neck ROM: Full    Dental  (+) Dental Advisory Given   Pulmonary Current Smoker,    breath sounds clear to auscultation       Cardiovascular hypertension, Pt. on medications and Pt. on home beta blockers + angina + CAD and + Cardiac Stents   Rhythm:Regular Rate:Normal  05/26/17 Cath: Total occlusion within the extensively stented right coronary. Distal vessel fills by collaterals from left to right. Widely patent left main. 95% stenosis proximal to mid LAD distal to the first diagonal. 80% proximal to mid circumflex and 90% proximal first obtuse marginal. Inferobasal akinesis. EF 35%. Normal LVEDP '17 ECHO: EF 50-55%, valves OK    Neuro/Psych Depression negative neurological ROS     GI/Hepatic Neg liver ROS, GERD  Medicated and Controlled,  Endo/Other  Morbid obesity  Renal/GU negative Renal ROS     Musculoskeletal  (+) Arthritis ,   Abdominal (+) + obese,   Peds  Hematology negative hematology ROS (+)   Anesthesia Other Findings   Reproductive/Obstetrics                            Anesthesia Physical Anesthesia Plan  ASA: IV and emergent  Anesthesia Plan: General   Post-op Pain Management:    Induction: Intravenous  PONV Risk Score and Plan: Treatment may vary due to age or medical condition  Airway Management Planned: Oral ETT  Additional Equipment: Arterial line, PA Cath, TEE and Ultrasound Guidance Line Placement  Intra-op Plan:   Post-operative Plan: Post-operative intubation/ventilation  Informed Consent: I  have reviewed the patients History and Physical, chart, labs and discussed the procedure including the risks, benefits and alternatives for the proposed anesthesia with the patient or authorized representative who has indicated his/her understanding and acceptance.   Dental advisory given  Plan Discussed with: CRNA and Surgeon  Anesthesia Plan Comments: (Plan routine monitors, A line, PA catheter, GETA with TEE and post op ventilation)        Anesthesia Quick Evaluation

## 2017-05-26 NOTE — ED Notes (Signed)
Placed on hospital bed at this time 

## 2017-05-27 ENCOUNTER — Encounter (HOSPITAL_COMMUNITY): Payer: Self-pay | Admitting: Cardiothoracic Surgery

## 2017-05-27 ENCOUNTER — Inpatient Hospital Stay (HOSPITAL_COMMUNITY): Payer: 59

## 2017-05-27 LAB — GLUCOSE, CAPILLARY
Glucose-Capillary: 112 mg/dL — ABNORMAL HIGH (ref 65–99)
Glucose-Capillary: 118 mg/dL — ABNORMAL HIGH (ref 65–99)
Glucose-Capillary: 121 mg/dL — ABNORMAL HIGH (ref 65–99)
Glucose-Capillary: 128 mg/dL — ABNORMAL HIGH (ref 65–99)
Glucose-Capillary: 128 mg/dL — ABNORMAL HIGH (ref 65–99)
Glucose-Capillary: 135 mg/dL — ABNORMAL HIGH (ref 65–99)

## 2017-05-27 LAB — CBC
HCT: 29.3 % — ABNORMAL LOW (ref 36.0–46.0)
HCT: 29.4 % — ABNORMAL LOW (ref 36.0–46.0)
HCT: 28.8 % — ABNORMAL LOW (ref 36.0–46.0)
HEMOGLOBIN: 9.4 g/dL — AB (ref 12.0–15.0)
Hemoglobin: 9.7 g/dL — ABNORMAL LOW (ref 12.0–15.0)
Hemoglobin: 10.1 g/dL — ABNORMAL LOW (ref 12.0–15.0)
MCH: 29.8 pg (ref 26.0–34.0)
MCH: 31.5 pg (ref 26.0–34.0)
MCH: 30.3 pg (ref 26.0–34.0)
MCHC: 33.1 g/dL (ref 30.0–36.0)
MCHC: 34.4 g/dL (ref 30.0–36.0)
MCHC: 32.6 g/dL (ref 30.0–36.0)
MCV: 90.2 fL (ref 78.0–100.0)
MCV: 91.6 fL (ref 78.0–100.0)
MCV: 92.9 fL (ref 78.0–100.0)
PLATELETS: 153 10*3/uL (ref 150–400)
Platelets: 179 10*3/uL (ref 150–400)
Platelets: 157 10*3/uL (ref 150–400)
RBC: 3.25 MIL/uL — ABNORMAL LOW (ref 3.87–5.11)
RBC: 3.21 MIL/uL — ABNORMAL LOW (ref 3.87–5.11)
RBC: 3.1 MIL/uL — ABNORMAL LOW (ref 3.87–5.11)
RDW: 12.2 % (ref 11.5–15.5)
RDW: 12.9 % (ref 11.5–15.5)
RDW: 12.5 % (ref 11.5–15.5)
WBC: 12 10*3/uL — ABNORMAL HIGH (ref 4.0–10.5)
WBC: 14.9 10*3/uL — ABNORMAL HIGH (ref 4.0–10.5)
WBC: 15.5 10*3/uL — ABNORMAL HIGH (ref 4.0–10.5)

## 2017-05-27 LAB — URINALYSIS, COMPLETE (UACMP) WITH MICROSCOPIC
Bilirubin Urine: NEGATIVE
Glucose, UA: NEGATIVE mg/dL
Hgb urine dipstick: NEGATIVE
Ketones, ur: NEGATIVE mg/dL
Leukocytes, UA: NEGATIVE
Nitrite: NEGATIVE
Protein, ur: 30 mg/dL — AB
Specific Gravity, Urine: 1.025 (ref 1.005–1.030)
pH: 5 (ref 5.0–8.0)

## 2017-05-27 LAB — POCT I-STAT 3, ART BLOOD GAS (G3+)
Acid-base deficit: 8 mmol/L — ABNORMAL HIGH (ref 0.0–2.0)
Acid-base deficit: 3 mmol/L — ABNORMAL HIGH (ref 0.0–2.0)
Acid-base deficit: 5 mmol/L — ABNORMAL HIGH (ref 0.0–2.0)
Bicarbonate: 19.4 mmol/L — ABNORMAL LOW (ref 20.0–28.0)
Bicarbonate: 22.9 mmol/L (ref 20.0–28.0)
Bicarbonate: 22.3 mmol/L (ref 20.0–28.0)
O2 Saturation: 96 %
O2 Saturation: 99 %
O2 Saturation: 97 %
Patient temperature: 37.2
Patient temperature: 37.9
Patient temperature: 37.5
TCO2: 21 mmol/L — ABNORMAL LOW (ref 22–32)
TCO2: 24 mmol/L (ref 22–32)
TCO2: 24 mmol/L (ref 22–32)
pCO2 arterial: 47.9 mmHg (ref 32.0–48.0)
pCO2 arterial: 46.2 mmHg (ref 32.0–48.0)
pCO2 arterial: 50.8 mmHg — ABNORMAL HIGH (ref 32.0–48.0)
pH, Arterial: 7.215 — ABNORMAL LOW (ref 7.350–7.450)
pH, Arterial: 7.307 — ABNORMAL LOW (ref 7.350–7.450)
pH, Arterial: 7.253 — ABNORMAL LOW (ref 7.350–7.450)
pO2, Arterial: 99 mmHg (ref 83.0–108.0)
pO2, Arterial: 153 mmHg — ABNORMAL HIGH (ref 83.0–108.0)
pO2, Arterial: 106 mmHg (ref 83.0–108.0)

## 2017-05-27 LAB — BASIC METABOLIC PANEL
Anion gap: 8 (ref 5–15)
BUN: 6 mg/dL (ref 6–20)
CO2: 22 mmol/L (ref 22–32)
CREATININE: 0.77 mg/dL (ref 0.44–1.00)
Calcium: 7.6 mg/dL — ABNORMAL LOW (ref 8.9–10.3)
Chloride: 103 mmol/L (ref 101–111)
GFR calc Af Amer: >60 mL/min (ref >60)
GLUCOSE: 126 mg/dL — AB (ref 65–99)
Potassium: 5 mmol/L (ref 3.5–5.1)
SODIUM: 133 mmol/L — AB (ref 135–145)

## 2017-05-27 LAB — POCT I-STAT, CHEM 8
BUN: 11 mg/dL (ref 6–20)
Calcium, Ion: 1.16 mmol/L (ref 1.15–1.40)
Chloride: 101 mmol/L (ref 101–111)
Creatinine, Ser: 0.9 mg/dL (ref 0.44–1.00)
Glucose, Bld: 119 mg/dL — ABNORMAL HIGH (ref 65–99)
HCT: 28 % — ABNORMAL LOW (ref 36.0–46.0)
Hemoglobin: 9.5 g/dL — ABNORMAL LOW (ref 12.0–15.0)
Potassium: 5.4 mmol/L — ABNORMAL HIGH (ref 3.5–5.1)
Sodium: 134 mmol/L — ABNORMAL LOW (ref 135–145)
TCO2: 24 mmol/L (ref 22–32)

## 2017-05-27 LAB — MAGNESIUM
MAGNESIUM: 2.6 mg/dL — AB (ref 1.7–2.4)
Magnesium: 2.4 mg/dL (ref 1.7–2.4)

## 2017-05-27 LAB — CREATININE, SERUM
CREATININE: 1 mg/dL (ref 0.44–1.00)
Creatinine, Ser: 0.87 mg/dL (ref 0.44–1.00)
GFR calc Af Amer: >60 mL/min (ref >60)
GFR calc Af Amer: >60 mL/min (ref >60)
GFR calc non Af Amer: >60 mL/min (ref >60)
GFR calc non Af Amer: 55 mL/min — ABNORMAL LOW (ref >60)

## 2017-05-27 MED ORDER — ASPIRIN EC 81 MG PO TBEC
81.0000 mg | DELAYED_RELEASE_TABLET | Freq: Every day | ORAL | Status: DC
  Administered 2017-05-29 – 2017-05-30 (×2): 81 mg via ORAL
  Filled 2017-05-27 (×3): qty 1

## 2017-05-27 MED ORDER — ASPIRIN 81 MG PO CHEW: 81.0000 mg | CHEWABLE_TABLET | Freq: Every day | ORAL | Status: DC

## 2017-05-27 MED ORDER — KETOROLAC TROMETHAMINE 15 MG/ML IJ SOLN
15.0000 mg | Freq: Four times a day (QID) | INTRAMUSCULAR | Status: DC | PRN
  Administered 2017-05-28 – 2017-05-30 (×2): 15 mg via INTRAVENOUS
  Filled 2017-05-27 (×2): qty 1

## 2017-05-27 MED ORDER — FUROSEMIDE 10 MG/ML IJ SOLN
40.0000 mg | Freq: Once | INTRAMUSCULAR | Status: AC
  Administered 2017-05-27: 40 mg via INTRAVENOUS
  Filled 2017-05-27: qty 4

## 2017-05-27 MED ORDER — CHLORHEXIDINE GLUCONATE CLOTH 2 % EX PADS
6.0000 | MEDICATED_PAD | Freq: Every day | CUTANEOUS | Status: DC
  Administered 2017-05-27 – 2017-06-05 (×9): 6 via TOPICAL

## 2017-05-27 MED ORDER — TRAMADOL HCL 50 MG PO TABS: 50.0000 mg | ORAL_TABLET | Freq: Four times a day (QID) | ORAL | Status: DC | PRN

## 2017-05-27 MED ORDER — INSULIN ASPART 100 UNIT/ML ~~LOC~~ SOLN
0.0000 [IU] | SUBCUTANEOUS | Status: DC
  Administered 2017-05-27 – 2017-05-29 (×5): 2 [IU] via SUBCUTANEOUS

## 2017-05-27 MED ORDER — SODIUM CHLORIDE 0.9% FLUSH
10.0000 mL | Freq: Two times a day (BID) | INTRAVENOUS | Status: DC
  Administered 2017-05-27 – 2017-06-04 (×15): 10 mL

## 2017-05-27 MED ORDER — OXYCODONE HCL 5 MG PO TABS: 5.0000 mg | ORAL_TABLET | ORAL | Status: DC | PRN

## 2017-05-27 MED ORDER — WHITE PETROLATUM GEL
Status: AC
  Administered 2017-05-27: 16:00:00
  Filled 2017-05-27: qty 1

## 2017-05-27 MED ORDER — ENOXAPARIN SODIUM 30 MG/0.3ML ~~LOC~~ SOLN
30.0000 mg | Freq: Every day | SUBCUTANEOUS | Status: DC
  Administered 2017-05-27 – 2017-06-04 (×9): 30 mg via SUBCUTANEOUS
  Filled 2017-05-27 (×9): qty 0.3

## 2017-05-27 MED ORDER — SODIUM CHLORIDE 0.9% FLUSH: 10.0000 mL | INTRAVENOUS | Status: DC | PRN

## 2017-05-27 MED FILL — Sodium Chloride IV Soln 0.9%: INTRAVENOUS | Qty: 2000 | Status: AC

## 2017-05-27 MED FILL — Sodium Bicarbonate IV Soln 8.4%: INTRAVENOUS | Qty: 50 | Status: AC

## 2017-05-27 MED FILL — Electrolyte-R (PH 7.4) Solution: INTRAVENOUS | Qty: 4000 | Status: AC

## 2017-05-27 MED FILL — Heparin Sodium (Porcine) Inj 1000 Unit/ML: INTRAMUSCULAR | Qty: 10 | Status: AC

## 2017-05-27 MED FILL — Lidocaine HCl IV Inj 20 MG/ML: INTRAVENOUS | Qty: 5 | Status: AC

## 2017-05-27 MED FILL — Verapamil HCl IV Soln 2.5 MG/ML: INTRAVENOUS | Qty: 2 | Status: AC

## 2017-05-27 MED FILL — Mannitol IV Soln 20%: INTRAVENOUS | Qty: 500 | Status: AC

## 2017-05-27 MED FILL — Lidocaine HCl Local Inj 2%: INTRAMUSCULAR | Qty: 10 | Status: AC

## 2017-05-27 NOTE — Care Management Note (Addendum)
Case Management Note  Patient Details  Name: LACHANDRA DETTMANN MRN: [id number] Date of Birth: 06-Mar-1946  Subjective/Objective:  From home with spouse, he states he works but between him and his daughter and son someone will be with patient, POD 1 CABG, d/c chest tubes, cont to wean drips.    10/12 Tomi Bamberger RN, BSN -,Patient states she has a PCP and medication coverage.  Spouse was asking questions about inpatient rehab, NCM informed him that physical therapy would be the one to rec that after they work with patient. He states he does not want SNF.  NCM saw after spoke with spouse, pt recs are for SNF.  NCM informed CSW of this information.                 Action/Plan: NCM will follow progression.   Expected Discharge Date:                  Expected Discharge Plan:     In-House Referral:     Discharge planning Services  CM Consult  Post Acute Care Choice:    Choice offered to:     DME Arranged:    DME Agency:     HH Arranged:    HH Agency:     Status of Service:  In process, will continue to follow  If discussed at Long Length of Stay Meetings, dates discussed:    Additional Comments:  Zenon Mayo, RN 05/27/2017, 2:44 PM

## 2017-05-27 NOTE — Op Note (Signed)
NAMEAGGIE, DOUSE                ACCOUNT NO.:  [id number]  MEDICAL RECORD NO.:  [id number]  LOCATION:                                 FACILITY:  PHYSICIAN:  Lanelle Bal, MD         DATE OF BIRTH:  DATE OF PROCEDURE:  05/26/2017 DATE OF DISCHARGE:                              OPERATIVE REPORT   PREOPERATIVE DIAGNOSIS:  Emergency coronary artery bypass grafting with evolving myocardial infarction.  POSTOPERATIVE DIAGNOSIS:  Emergency coronary artery bypass grafting with evolving myocardial infarction.  SURGICAL PROCEDURE:  Coronary artery bypass grafting x3 with the left internal mammary to the left anterior descending coronary artery, reverse saphenous vein graft to the first obtuse marginal coronary artery, and reverse saphenous vein graft to the posterior descending coronary artery with right thigh greater saphenous endo vein harvesting.  SURGEON:  Lanelle Bal, MD.  FIRST ASSISTANT:  Ellwood Handler, PA.  BRIEF HISTORY:  The patient is a 71 year old female with a long smoking history and known coronary occlusive disease, having had multiple stents and cardiac caths in the past, especially involving the right coronary artery.  For the past 3 weeks, she had been having increasing symptoms of substernal chest discomfort, and then over the last several days, associated increasing shortness of breath.  The day prior to surgery, she came to the [facility] Emergency Room with complaint of substernal chest pain.  She stayed in the emergency room for 16 hours, and then underwent emergency cardiac catheterization on the day of surgery by Dr. Daneen Schick.  Cardiac cath revealed 90% stenosis of the LAD just past the takeoff of the diagonal.  In addition, there was 80% stenosis in the proximal circumflex and into the first obtuse marginal.  The numerous stents in the right coronary artery were totally occluded with faint collateral filling of the distal right system.  Ejection fraction  was 30%.  Because of ongoing chest pain and critical 3-vessel anatomy, Dr. Tamala Julian requested emergency consultation for bypass.  The risks and options were discussed with the patient, her son and daughter-in-law. The patient was agreeable with proceeding.  DESCRIPTION OF PROCEDURE:  With Swan-Ganz and arterial line monitors in place, the patient underwent general endotracheal anesthesia without incident.  Dr. Glennon Mac placed a TEE probe.  She had mild mitral regurgitation.  Ejection fraction was slightly better than estimated at cardiac cath.  PA pressures were relatively low.  The skin of the chest and legs was prepped with Betadine and draped in the usual sterile manner.  We performed the usual time-out and then proceeded with endo vein harvesting of the right greater saphenous vein endoscopically from the right thigh.  The vein was of good quality and caliber.  Median sternotomy was performed.  The left internal mammary artery was dissected down as a pedicle graft.  The distal artery was divided, had good free flow.  Pericardium was opened.  Overall ventricular function appeared preserved.  The patient was systemically heparinized.  The ascending aorta was cannulated.  The right atrium was cannulated.  An aortic root vent cardioplegia needle was introduced into the ascending aorta.  The patient was placed on cardiopulmonary bypass 2.4 L/min per  meters squared.  Sites of anastomosis were inspected and dissected out of the epicardium.  The patient's body temperature was cooled to 32 degrees.  Aortic crossclamp was applied; 500 mL of cold blood potassium cardioplegia was administered with diastolic arrest of the heart, and myocardial temperature was monitored throughout the crossclamp.  We turned our attention first to the distal right coronary artery which was covered with stents.  However, in the proximal posterior descending, the vessel appeared fairly normal, pliable, and was able to be  opened; a 1.5 mm probe passed distally without difficulty.  Using a running 7-0 Prolene, distal anastomosis was performed.  The heart was then elevated, and the obtuse marginal vessel was opened and admitted a 1.5 mm probe. Using a running 7-0 Prolene, distal anastomosis was performed. Attention was then turned to the left anterior descending coronary artery wherein the midportion of the vessel was opened and admitted a 1 mm probe distally.  Using a running 8-0 Prolene, the left internal mammary artery was anastomosed to the left anterior descending coronary artery.  With the crossclamp still in place, 2 punch aortotomies were performed, and each of the 2 vein grafts were anastomosed to the ascending aorta.  The bulldog on the mammary artery was removed with prompt rise in myocardial septal temperature.  The heart was allowed to passively fill and de-air.  The 2 proximal anastomoses were completed, and the aortic crossclamp was removed.  The total crossclamp time was 63 minutes.  The patient spontaneously converted to a sinus rhythm.  Atrial and ventricular pacing wires were applied.  With the patient's body temperature re-warmed to 37 degrees, she was then ventilated and weaned from cardiopulmonary bypass without difficulty.  She remained hemodynamically stable, was decannulated in the usual fashion. Protamine sulfate was administered.  The patient had been on chronic Plavix therapy.  With the operative field hemostatic, a left pleural tube and a Blake mediastinal drain were left in place.  The pericardium was loosely reapproximated.  The sternum was closed with #6 stainless steel wire.  Fascia closed with interrupted 0 Vicryl, running 3-0 Vicryl in the subcutaneous tissue, and 4-0 subcuticular stitch in the skin edges.  Dry dressings were applied.  Sponge and needle count was reported as correct at the completion of the procedure.  The patient tolerated the procedure without obvious  complication, and was transferred to Surgical Intensive Care Unit for further postoperative care.  She did not require any blood products during the operative procedure.  Total pump time was 92 minutes.     Lanelle Bal, MD   ______________________________ Lanelle Bal, MD    EG/MEDQ  D:  05/27/2017  T:  05/27/2017  Job:  [id number]  cc:   Belva Crome, M.D.

## 2017-05-27 NOTE — Progress Notes (Signed)
Pt placed back on full support due to ABG values. Will try again in an hour

## 2017-05-27 NOTE — Progress Notes (Signed)
Patient ID: Bianca [name], female   DOB: 09-04-1945, 71 y.o.   MRN: [id number] EVENING ROUNDS NOTE :     [address]       [place] [address]             [phone]                 1 Day Post-Op Procedure(s) (LRB): CORONARY ARTERY BYPASS GRAFTING (CABG), ON PUMP, TIMES Three, using left internal mammary artery and right greater saphenous vein harvested endoscopically (N/A) TRANSESOPHAGEAL ECHOCARDIOGRAM (TEE) (N/A)  Total Length of Stay:  LOS: 2 days  BP 113/63   Pulse (!) 158   Temp 98.9 F (37.2 C) (Oral)   Resp 14   Ht 5\' 1"  (1.549 m)   Wt 191 lb 2.2 oz (86.7 kg)   LMP  (LMP Unknown)   SpO2 (!) 81%   BMI 36.12 kg/m   .Intake/Output      10/09 0701 - 10/10 0700 10/10 0701 - 10/11 0700   P.O. 30    I.V. (mL/kg) 3397.3 (39.2) 452 (5.2)   Blood 407    NG/GT 30    IV Piggyback 1200 150   Total Intake(mL/kg) 5064.3 (58.4) 602 (6.9)   Urine (mL/kg/hr) 2405 (1.2) 175 (0.2)   Blood 1050    Chest Tube 160 100   Total Output 3615 275   Net +1449.3 +327          . sodium chloride Stopped (05/27/17 1430)  . sodium chloride    . sodium chloride    . insulin (NOVOLIN-R) infusion Stopped (05/26/17 2300)  . lactated ringers    . lactated ringers 20 mL/hr at 05/27/17 0400  . lactated ringers Stopped (05/27/17 0700)  . nitroGLYCERIN    . phenylephrine (NEO-SYNEPHRINE) Adult infusion 10 mcg/min (05/27/17 1818)     Lab Results  Component Value Date   WBC 14.9 (H) 05/27/2017   HGB 10.1 (L) 05/27/2017   HCT 29.4 (L) 05/27/2017   PLT 179 05/27/2017   GLUCOSE 126 (H) 05/27/2017   CHOL 233 (H) 10/31/2016   TRIG 316.0 (H) 10/31/2016   HDL 48.80 10/31/2016   LDLDIRECT 129.0 10/31/2016   LDLCALC 106 (H) 01/03/2014   ALT 25 01/02/2017   AST 30 01/02/2017   NA 133 (L) 05/27/2017   K 5.0 05/27/2017   CL 103 05/27/2017   CREATININE 1.00 05/27/2017   BUN 6 05/27/2017   CO2 22 05/27/2017   TSH 1.59 01/13/2017   INR 1.46 05/26/2017   HGBA1C 5.8 02/14/2015     Cr ok, uop low will give lasix , urine " green" will send ua and culture  Patient weak not able to ambulate, chronic back pain   [name] MD  Beeper [phone] Office [phone] 05/27/2017 6:43 PM

## 2017-05-27 NOTE — Progress Notes (Addendum)
[facility] DAILY ICU PROGRESS NOTE                   [address]            [place] [address]          [phone]   1 Day Post-Op Procedure(s) (LRB): CORONARY ARTERY BYPASS GRAFTING (CABG), ON PUMP, TIMES Three, using left internal mammary artery and right greater saphenous vein harvested endoscopically (N/A) TRANSESOPHAGEAL ECHOCARDIOGRAM (TEE) (N/A)  Total Length of Stay:  LOS: 2 days   Subjective: Feels moderate incisional pain, fatigued  Objective: Vital signs in last 24 hours: Temp:  [95.7 F (35.4 C)-100.4 F (38 C)] 99.1 F (37.3 C) (10/10 0800) Pulse Rate:  [0-90] 90 (10/10 0800) Cardiac Rhythm: Atrial paced (10/10 0800) Resp:  [11-28] 21 (10/10 0800) BP: (85-137)/(43-103) 94/58 (10/10 0800) SpO2:  [0 %-100 %] 98 % (10/10 0800) Arterial Line BP: (84-148)/(49-82) 129/49 (10/10 0800) FiO2 (%):  [40 %-50 %] 40 % (10/10 0400) Weight:  [182 lb 15.7 oz (83 kg)-191 lb 2.2 oz (86.7 kg)] 191 lb 2.2 oz (86.7 kg) (10/10 0500)  Filed Weights   05/25/17 1613 05/26/17 1900 05/27/17 0500  Weight: 183 lb (83 kg) 182 lb 15.7 oz (83 kg) 191 lb 2.2 oz (86.7 kg)    Weight change: -0.3 oz (-0.008 kg)   Hemodynamic parameters for last 24 hours: PAP: (16-39)/(8-29) 29/16 CO:  [3.1 L/min-4.2 L/min] 4.2 L/min CI:  [1.7 L/min/m2-2.3 L/min/m2] 2.3 L/min/m2  Intake/Output from previous day: 10/09 0701 - 10/10 0700 In: 5064.3 [P.O.:30; I.V.:3397.3; Blood:407; NG/GT:30; IV Piggyback:1200] Out: 4917 [Urine:2405; Blood:1050; Chest Tube:160]  Intake/Output this shift: Total I/O In: 55 [I.V.:55] Out: 40 [Urine:10; Chest Tube:30]  Current Meds: Scheduled Meds: . acetaminophen  1,000 mg Oral Q6H   Or  . acetaminophen (TYLENOL) oral liquid 160 mg/5 mL  1,000 mg Per Tube Q6H  . aspirin EC  325 mg Oral Daily   Or  . aspirin  324 mg Per Tube Daily  . atorvastatin  80 mg Oral q1800  . bisacodyl  10 mg Oral Daily   Or  . bisacodyl  10 mg Rectal Daily  . bisacodyl  5  mg Oral Once  . buPROPion  150 mg Oral Daily  . chlorhexidine  15 mL Mouth/Throat Once  . chlorhexidine gluconate (MEDLINE KIT)  15 mL Mouth Rinse BID  . Chlorhexidine Gluconate Cloth  6 each Topical Once   And  . Chlorhexidine Gluconate Cloth  6 each Topical Once  . Chlorhexidine Gluconate Cloth  6 each Topical Daily  . docusate sodium  200 mg Oral Daily  . escitalopram  20 mg Oral Daily  . hydrOXYzine  25 mg Oral QHS  . insulin aspart  0-24 Units Subcutaneous Q4H  . insulin regular  0-10 Units Intravenous TID WC  . levalbuterol  0.63 mg Nebulization TID  . meclizine  25 mg Oral BID  . mouth rinse  15 mL Mouth Rinse QID  . metoCLOPramide (REGLAN) injection  10 mg Intravenous Q6H  . metoprolol tartrate  12.5 mg Oral BID   Or  . metoprolol tartrate  12.5 mg Per Tube BID  . metoprolol tartrate  12.5 mg Oral Once  . [START ON 05/28/2017] pantoprazole  40 mg Oral Daily  . pentosan polysulfate  200 mg Oral BID  . sodium chloride flush  10-40 mL Intracatheter Q12H  . sodium chloride flush  3 mL Intravenous Q12H   Continuous  Infusions: . sodium chloride 20 mL/hr at 05/27/17 0400  . sodium chloride    . sodium chloride    . albumin human 250 mL (05/26/17 2300)  . cefUROXime (ZINACEF)  IV    . dexmedetomidine (PRECEDEX) IV infusion Stopped (05/27/17 0000)  . famotidine (PEPCID) IV Stopped (05/26/17 2230)  . insulin (NOVOLIN-R) infusion Stopped (05/26/17 2300)  . lactated ringers    . lactated ringers 20 mL/hr at 05/27/17 0400  . lactated ringers Stopped (05/27/17 0700)  . levofloxacin (LEVAQUIN) IV    . nitroGLYCERIN    . phenylephrine (NEO-SYNEPHRINE) Adult infusion 25 mcg/min (05/27/17 0806)   PRN Meds:.sodium chloride, albumin human, lactated ringers, metoprolol tartrate, midazolam, morphine injection, ondansetron (ZOFRAN) IV, sodium chloride flush, sodium chloride flush, temazepam  General appearance: fatigued and no distress Neurologic: intact and no abnormal fingings  grossly Heart: regular rate and rhythm Lungs: clear anteriorly Abdomen: soft, nontender Extremities: min edema Wound: dressings CDI  Lab Results: CBC: Recent Labs  05/26/17 1916 05/27/17 0327  WBC 9.1 12.0*  HGB 8.8* 9.7*  HCT 25.5* 29.3*  PLT 110* 153   BMET:  Recent Labs  05/25/17 1622  05/26/17 1729 05/26/17 1852 05/27/17 0327  NA 137  < > 138 139 133*  K 4.1  < > 3.9 3.8 5.0  CL 101  < > 102  --  103  CO2 26  --   --   --  22  GLUCOSE 112*  < > 121* 129* 126*  BUN 8  < > 4*  --  6  CREATININE 0.86  < > 0.50  --  0.77  CALCIUM 9.5  --   --   --  7.6*  < > = values in this interval not displayed.  CMET: Lab Results  Component Value Date   WBC 12.0 (H) 05/27/2017   HGB 9.7 (L) 05/27/2017   HCT 29.3 (L) 05/27/2017   PLT 153 05/27/2017   GLUCOSE 126 (H) 05/27/2017   CHOL 233 (H) 10/31/2016   TRIG 316.0 (H) 10/31/2016   HDL 48.80 10/31/2016   LDLDIRECT 129.0 10/31/2016   LDLCALC 106 (H) 01/03/2014   ALT 25 01/02/2017   AST 30 01/02/2017   NA 133 (L) 05/27/2017   K 5.0 05/27/2017   CL 103 05/27/2017   CREATININE 0.77 05/27/2017   BUN 6 05/27/2017   CO2 22 05/27/2017   TSH 1.59 01/13/2017   INR 1.46 05/26/2017   HGBA1C 5.8 02/14/2015   ABG    Component Value Date/Time   PHART 7.253 (L) 05/27/2017 0615   PCO2ART 50.8 (H) 05/27/2017 0615   PO2ART 106.0 05/27/2017 0615   HCO3 22.3 05/27/2017 0615   TCO2 24 05/27/2017 0615   ACIDBASEDEF 5.0 (H) 05/27/2017 0615   O2SAT 97.0 05/27/2017 0615     PT/INR:  Recent Labs  05/26/17 1916  LABPROT 17.6*  INR 1.46   Radiology: Dg Chest Port 1 View  Result Date: 05/27/2017 CLINICAL DATA:  71 year old female status post CABG x3 EXAM: PORTABLE CHEST 1 VIEW COMPARISON:  Prior chest x-ray 05/26/2017 FINDINGS: The patient has been extubated in the nasogastric tube removed. The right IJ approach Swan-Ganz catheter remains in place. The tip overlies the main pulmonary outflow tract. Mediastinal and left-sided  thoracostomy tubes are in unchanged position. Inspiratory volumes remain low with bibasilar atelectasis. No pneumothorax or large pleural effusion. No evidence of pulmonary edema. Stable cardiomegaly. Patient is status post median sternotomy with evidence of multivessel CABG including LIMA bypass. IMPRESSION: 1. Interval  extubation and removal of gastric tube. 2. Slightly increased bibasilar atelectasis following extubation. 3. No evidence of pneumothorax, large pleural effusion or pulmonary edema. 4. Other support apparatus remain in stable and satisfactory position. Electronically Signed   By: Jacqulynn Cadet M.D.   On: 05/27/2017 08:13   Dg Chest Port 1 View  Result Date: 05/26/2017 CLINICAL DATA:  Postop coronary artery bypass graft. EXAM: PORTABLE CHEST 1 VIEW COMPARISON:  05/25/2017 FINDINGS: Endotracheal tube is 1.4 cm above the carina. Enteric tube extends into the stomach and beyond the inferior edge of the image. Right jugular Swan-Ganz catheter terminates in the RV outflow tract. Left chest tube and mediastinal drain noted. No pneumothorax. No large effusion. Mild atelectatic appearing linear left base opacities. Right lung is clear. Pulmonary vasculature is normal. Hilar, mediastinal and cardiac contours are unremarkable. IMPRESSION: 1.  Support equipment appears satisfactorily positioned. 2. No pneumothorax or significant effusion. Mild linear atelectatic appearing left lung base opacities. Electronically Signed   By: Andreas Newport M.D.   On: 05/26/2017 19:35     Assessment/Plan: S/P Procedure(s) (LRB): CORONARY ARTERY BYPASS GRAFTING (CABG), ON PUMP, TIMES Three, using left internal mammary artery and right greater saphenous vein harvested endoscopically (N/A) TRANSESOPHAGEAL ECHOCARDIOGRAM (TEE) (N/A)  1 hemodyn stable in sinus on neo 2 resp alkalosis- will need aggressive pulm toilet/ cont xopenex with smoking history 3 Chest tube drainage pretty low- d/c soon 4 d/c aline and  SGanz when neo off 5 will need some diuresis for volume overload- UO currently good- monitor closely 6 mild leukocytosis- reactive , follow 5 ABL anemia- monitor over time 6 inferior lead Q wave, trop .91, c/w MI 7 TG/HDL ratio is poor, will need dietary/lifestyle/pharm management long term in addition to smoking cessation, consider fish oil/ omega 3 as well as statin 8 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  [name] MD Beeper [phone] Office [phone] 05/27/2017 9:51 AM   Patient and family notes that 3 months ago she stopped taking dilaudid for 2 years after back surgery - Dr Joya Salm was back surgeon, Dr Nelva Bush was prescribing dilaudid Patient no allergic to pain meds but wants to keep to minimum, was staring to be seen in "Pain Clinic" was considering starting Suboxone but never started . I have seen and examined [name] and agree with the above assessment  and plan.  [name] MD Beeper [phone] Office [phone] 05/27/2017 10:05 AM

## 2017-05-27 NOTE — Procedures (Signed)
Extubation Procedure Note  Patient Details:   Name: Bianca [name] DOB: 03-13-1946 MRN: [id number]   Airway Documentation:     Evaluation  O2 sats: stable throughout Complications: No apparent complications Patient did tolerate procedure well. Bilateral Breath Sounds: Clear, Diminished   Yes, pt able to hoarsely vocalize name and cough to clear secretions. Pt placed on 4L nasal cannula and is tolerating well. Pt had NIF of -30 and VC of 5L. Pt positive for cuff leak before extubation.   Virgilio Frees 05/27/2017, 5:06 AM

## 2017-05-28 ENCOUNTER — Inpatient Hospital Stay (HOSPITAL_COMMUNITY): Payer: 59

## 2017-05-28 LAB — GLUCOSE, CAPILLARY
Glucose-Capillary: 117 mg/dL — ABNORMAL HIGH (ref 65–99)
Glucose-Capillary: 120 mg/dL — ABNORMAL HIGH (ref 65–99)
Glucose-Capillary: 121 mg/dL — ABNORMAL HIGH (ref 65–99)
Glucose-Capillary: 127 mg/dL — ABNORMAL HIGH (ref 65–99)
Glucose-Capillary: 129 mg/dL — ABNORMAL HIGH (ref 65–99)
Glucose-Capillary: 132 mg/dL — ABNORMAL HIGH (ref 65–99)

## 2017-05-28 LAB — BASIC METABOLIC PANEL
Anion gap: 6 (ref 5–15)
BUN: 13 mg/dL (ref 6–20)
CO2: 23 mmol/L (ref 22–32)
Calcium: 8 mg/dL — ABNORMAL LOW (ref 8.9–10.3)
Chloride: 103 mmol/L (ref 101–111)
Creatinine, Ser: 1.34 mg/dL — ABNORMAL HIGH (ref 0.44–1.00)
GFR calc Af Amer: 45 mL/min — ABNORMAL LOW (ref >60)
GFR calc non Af Amer: 39 mL/min — ABNORMAL LOW (ref >60)
Glucose, Bld: 123 mg/dL — ABNORMAL HIGH (ref 65–99)
Potassium: 5 mmol/L (ref 3.5–5.1)
Sodium: 132 mmol/L — ABNORMAL LOW (ref 135–145)

## 2017-05-28 LAB — CBC
HCT: 27 % — ABNORMAL LOW (ref 36.0–46.0)
Hemoglobin: 8.8 g/dL — ABNORMAL LOW (ref 12.0–15.0)
MCH: 30.1 pg (ref 26.0–34.0)
MCHC: 32.6 g/dL (ref 30.0–36.0)
MCV: 92.5 fL (ref 78.0–100.0)
Platelets: 135 10*3/uL — ABNORMAL LOW (ref 150–400)
RBC: 2.92 MIL/uL — ABNORMAL LOW (ref 3.87–5.11)
RDW: 12.6 % (ref 11.5–15.5)
WBC: 12.2 10*3/uL — ABNORMAL HIGH (ref 4.0–10.5)

## 2017-05-28 LAB — URINE CULTURE: Culture: NO GROWTH

## 2017-05-28 LAB — TSH: TSH: 5.685 u[IU]/mL — ABNORMAL HIGH (ref 0.350–4.500)

## 2017-05-28 MED ORDER — FUROSEMIDE 10 MG/ML IJ SOLN
40.0000 mg | Freq: Once | INTRAMUSCULAR | Status: AC
  Administered 2017-05-28: 40 mg via INTRAVENOUS
  Filled 2017-05-28: qty 4

## 2017-05-28 MED ORDER — AMIODARONE HCL IN DEXTROSE 360-4.14 MG/200ML-% IV SOLN
60.0000 mg/h | INTRAVENOUS | Status: AC
  Administered 2017-05-28 (×2): 60 mg/h via INTRAVENOUS
  Filled 2017-05-28: qty 200

## 2017-05-28 MED ORDER — CHLORHEXIDINE GLUCONATE CLOTH 2 % EX PADS
6.0000 | MEDICATED_PAD | Freq: Every day | CUTANEOUS | Status: DC
  Administered 2017-05-29 – 2017-05-30 (×2): 6 via TOPICAL

## 2017-05-28 MED ORDER — AMIODARONE LOAD VIA INFUSION
150.0000 mg | Freq: Once | INTRAVENOUS | Status: AC
  Administered 2017-05-28: 150 mg via INTRAVENOUS
  Filled 2017-05-28: qty 83.34

## 2017-05-28 MED ORDER — SODIUM CHLORIDE 0.9% FLUSH
10.0000 mL | INTRAVENOUS | Status: DC | PRN
  Administered 2017-06-01: 10 mL
  Administered 2017-06-04: 30 mL
  Filled 2017-05-28 (×2): qty 40

## 2017-05-28 MED ORDER — AMIODARONE HCL IN DEXTROSE 360-4.14 MG/200ML-% IV SOLN
30.0000 mg/h | INTRAVENOUS | Status: DC
  Administered 2017-05-28 – 2017-05-30 (×4): 30 mg/h via INTRAVENOUS
  Filled 2017-05-28 (×4): qty 200

## 2017-05-28 MED ORDER — GABAPENTIN 300 MG PO CAPS
300.0000 mg | ORAL_CAPSULE | Freq: Every day | ORAL | Status: DC
  Administered 2017-05-28 – 2017-06-04 (×8): 300 mg via ORAL
  Filled 2017-05-28 (×8): qty 1

## 2017-05-28 MED ORDER — AMIODARONE HCL 200 MG PO TABS: 200.0000 mg | ORAL_TABLET | Freq: Every day | ORAL | Status: DC

## 2017-05-28 MED ORDER — FUROSEMIDE 10 MG/ML IJ SOLN
80.0000 mg | Freq: Once | INTRAMUSCULAR | Status: AC
  Administered 2017-05-28: 80 mg via INTRAVENOUS
  Filled 2017-05-28: qty 8

## 2017-05-28 MED ORDER — AMIODARONE HCL 200 MG PO TABS: 200.0000 mg | ORAL_TABLET | Freq: Two times a day (BID) | ORAL | Status: DC

## 2017-05-28 MED ORDER — FENTANYL CITRATE (PF) 100 MCG/2ML IJ SOLN
INTRAMUSCULAR | Status: AC
  Filled 2017-05-28: qty 2

## 2017-05-28 MED ORDER — BACLOFEN 10 MG PO TABS
10.0000 mg | ORAL_TABLET | Freq: Every day | ORAL | Status: DC
  Administered 2017-05-28 – 2017-06-04 (×8): 10 mg via ORAL
  Filled 2017-05-28 (×10): qty 1

## 2017-05-28 MED ORDER — FENTANYL CITRATE (PF) 100 MCG/2ML IJ SOLN
25.0000 ug | INTRAMUSCULAR | Status: DC | PRN
  Administered 2017-05-28 – 2017-05-29 (×2): 25 ug via INTRAVENOUS
  Filled 2017-05-28: qty 2

## 2017-05-28 MED ORDER — AMIODARONE LOAD VIA INFUSION
150.0000 mg | Freq: Once | INTRAVENOUS | Status: AC
  Administered 2017-05-28: 150 mg via INTRAVENOUS

## 2017-05-28 MED ORDER — AMIODARONE HCL IN DEXTROSE 360-4.14 MG/200ML-% IV SOLN
INTRAVENOUS | Status: AC
  Filled 2017-05-28: qty 200

## 2017-05-28 NOTE — Anesthesia Postprocedure Evaluation (Signed)
Anesthesia Post Note  Patient: KELLIE MURRILL  Procedure(s) Performed: CORONARY ARTERY BYPASS GRAFTING (CABG), ON PUMP, TIMES Three, using left internal mammary artery and right greater saphenous vein harvested endoscopically (N/A Chest) TRANSESOPHAGEAL ECHOCARDIOGRAM (TEE) (N/A )     Patient location during evaluation: SICU Anesthesia Type: General Level of consciousness: awake and alert, patient cooperative and oriented Pain management: pain level controlled Vital Signs Assessment: post-procedure vital signs reviewed and stable Respiratory status: spontaneous breathing, nonlabored ventilation, respiratory function stable and patient connected to nasal cannula oxygen Cardiovascular status: stable and blood pressure returned to baseline Postop Assessment: no apparent nausea or vomiting Anesthetic complications: no    Last Vitals:  Vitals:   05/28/17 1400 05/28/17 1420  BP: (!) 107/56   Pulse: 65   Resp: 17   Temp:    SpO2: 93% 98%    Last Pain:  Vitals:   05/28/17 1232  TempSrc: Oral  PainSc:                  Rylen Hou,E. Wei [place]

## 2017-05-28 NOTE — Progress Notes (Signed)
Peripherally Inserted Central Catheter/Midline Placement  The IV Nurse has discussed with the patient and/or persons authorized to consent for the patient, the purpose of this procedure and the potential benefits and risks involved with this procedure.  The benefits include less needle sticks, lab draws from the catheter, and the patient may be discharged home with the catheter. Risks include, but not limited to, infection, bleeding, blood clot (thrombus formation), and puncture of an artery; nerve damage and irregular heartbeat and possibility to perform a PICC exchange if needed/ordered by physician.  Alternatives to this procedure were also discussed.  Bard Power PICC patient education guide, fact sheet on infection prevention and patient information card has been provided to patient /or left at bedside.    PICC/Midline Placement Documentation  PICC Triple Lumen 05/28/17 PICC Right Cephalic 38 cm (Active)       Aldona Lento L 05/28/2017, 10:10 PM

## 2017-05-28 NOTE — Progress Notes (Signed)
RN Jinny Blossom B pulled morphine (4mg /ml) from pyxis at 1310. Medication not given, but was already wasted in pyxis. Because medication was already wasted in pyxis, medication could not be returned. RN Verneita Griffes (charge nurse) witnessed Estate manager/land agent B waste full medication in Radiation protection practitioner.

## 2017-05-28 NOTE — Evaluation (Signed)
Physical Therapy Evaluation Patient Details Name: Bianca [name] MRN: [id number] DOB: 06/30/1946 Today's Date: 05/28/2017   History of Present Illness  Pt underwent CABG x 3..  Past Medical History:  Diagnosis Date  . Arthritis   . Blood transfusion without reported diagnosis   . CAD (coronary artery disease)    a. 1997 MI/PCI RCA;  b. 2007 MI/PCI of 100% RCA with Taxus DES x 3 placed, EF 55%;  c. 2010 Cath: stable anatomy;  d. 05/2012 NSTEMI/Cath/PCI: LM nl, LAD 60-31m, D1 20ost, LCX 41m, RCA 40-73m ISR, 60/95d (Treated w/ 2.75x33 Xience Xpedition DES), PDA 44m (Treated w/ PTCA), EF 60%.  . Carpal tunnel syndrome on both sides   . Chronic lower back pain   . Depression   . GERD (gastroesophageal reflux disease)   . Hyperlipidemia   . Myocardial infarction [facility]) 1997, 2007, 2013  . Numbness of foot    Left foot - back surgery 2009    Clinical Impression  Pt admitted with above diagnosis. Pt currently with functional limitations due to the deficits listed below (see PT Problem List). Pt was able to ambulate with +2 min assist with [place] walker 290 feet with 3rd personfor chair follow. Needs mod assist for sit to stand and bed mobility as well as sternal precautions. Family works.  Will need SNF.  Will follow acutely.   Pt will benefit from skilled PT to increase their independence and safety with mobility to allow discharge to the venue listed below.      Follow Up Recommendations SNF;Supervision/Assistance - 24 hour    Equipment Recommendations  Other (comment) (TBA)    Recommendations for Other Services       Precautions / Restrictions Precautions Precautions: Fall;Sternal Restrictions Weight Bearing Restrictions: No      Mobility  Bed Mobility               General bed mobility comments: Pt was in chair.  Transfers Overall transfer level: Needs assistance Equipment used:  H[place]on Pier walker) Transfers: Sit to/from Stand Sit to Stand: Mod assist;+2 physical  assistance;+2 safety/equipment         General transfer comment: Needed cues and assist to power up.  needed cues to hold pillow .  Ambulation/Gait Ambulation/Gait assistance: Min assist;+2 safety/equipment Ambulation Distance (Feet): 290 Feet Assistive device:  (eVA walker) Gait Pattern/deviations: Step-through pattern;Decreased stride length;Trunk flexed;Wide base of support   Gait velocity interpretation: Below normal speed for age/gender General Gait Details: Pt was able to ambulate with EVa walker with chair follow.  Cues to stand tall.  Pt had to have several standing rest breaks.  With incr time and rest breaks, pt pushed self and kept ambulating.   Stairs            Wheelchair Mobility    Modified Rankin (Stroke Patients Only)       Balance Overall balance assessment: Needs assistance;History of Falls Sitting-balance support: No upper extremity supported;Feet supported Sitting balance-Leahy Scale: Fair     Standing balance support: Bilateral upper extremity supported;During functional activity Standing balance-Leahy Scale: Poor Standing balance comment: relies on UE support                             Pertinent Vitals/Pain Pain Assessment: Faces Faces Pain Scale: Hurts little more Pain Location: chest Pain Descriptors / Indicators: Aching;Grimacing;Guarding Pain Intervention(s): Limited activity within patient's tolerance;Monitored during session;Repositioned  VSS.  On [place]  expects to be discharged to:: Private residence Living Arrangements: Spouse/significant other Available Help at Discharge: Family;Available PRN/intermittently (husband owns funeral home) Type of Home: House Home Access: Stairs to enter Entrance Stairs-Rails: Right;Left;Can reach both Entrance Stairs-Number of Steps: 3 Home Layout: Two level;Able to live on main level with bedroom/bathroom;1/2 bath on main level;Bed/bath upstairs Home  Equipment: None      Prior Function Level of Independence: Independent               Hand Dominance   Dominant Hand: Right    Extremity/Trunk Assessment   Upper Extremity Assessment Upper Extremity Assessment: Defer to OT evaluation    Lower Extremity Assessment Lower Extremity Assessment: Generalized weakness    Cervical / Trunk Assessment Cervical / Trunk Assessment: Normal  Communication   Communication: No difficulties  Cognition Arousal/Alertness: Awake/alert Behavior During Therapy: Flat affect Overall Cognitive Status: Impaired/Different from baseline Area of Impairment: Orientation;Memory;Following commands;Safety/judgement;Problem solving                     Memory: Decreased recall of precautions;Decreased short-term memory Following Commands: Follows one step commands inconsistently;Follows one step commands with increased time Safety/Judgement: Decreased awareness of safety;Decreased awareness of deficits   Problem Solving: Slow processing;Decreased initiation;Difficulty sequencing;Requires tactile cues;Requires verbal cues General Comments: Pt delayed processing.  Slow to respond.       General Comments      Exercises General Exercises - Lower Extremity Ankle Circles/Pumps: AROM;Both;10 reps;Supine Long Arc Quad: AROM;Both;10 reps;Seated   Assessment/Plan    PT Assessment Patient needs continued PT services  PT Problem List Decreased strength;Decreased activity tolerance;Decreased balance;Decreased mobility;Decreased knowledge of use of DME;Decreased safety awareness;Decreased knowledge of precautions;Pain       PT Treatment Interventions DME instruction;Gait training;Functional mobility training;Therapeutic activities;Therapeutic exercise;Balance training;Patient/family education    PT Goals (Current goals can be found in the Care Plan section)  Acute Rehab PT Goals Patient Stated Goal: to get better PT Goal Formulation: With  patient Time For Goal Achievement: 06/11/17 Potential to Achieve Goals: Good    Frequency Min 3X/week   Barriers to discharge Decreased caregiver support      Co-evaluation               AM-PAC PT "6 Clicks" Daily Activity  Outcome Measure Difficulty turning over in bed (including adjusting bedclothes, sheets and blankets)?: Unable Difficulty moving from lying on back to sitting on the side of the bed? : Unable Difficulty sitting down on and standing up from a chair with arms (e.g., wheelchair, bedside commode, etc,.)?: A Lot Help needed moving to and from a bed to chair (including a wheelchair)?: A Lot Help needed walking in hospital room?: A Lot Help needed climbing 3-5 steps with a railing? : Total 6 Click Score: 9    End of Session Equipment Utilized During Treatment: Gait belt;Oxygen Activity Tolerance: Patient limited by fatigue Patient left: with call bell/phone within reach;in bed Nurse Communication: Mobility status PT Visit Diagnosis: Muscle weakness (generalized) (M62.81);Pain Pain - part of body:  (chest)    Time: 8295-6213 PT Time Calculation (min) (ACUTE ONLY): 32 min   Charges:   PT Evaluation $PT Eval Moderate Complexity: 1 Mod PT Treatments $Gait Training: 8-22 mins   PT G Codes:        Lafe Clerk,PT Acute Rehabilitation [phone] (pager)   Denice Paradise 05/28/2017, 12:22 PM

## 2017-05-28 NOTE — Progress Notes (Signed)
Patient ID: Bianca [name], female   DOB: 11-07-1945, 71 y.o.   MRN: [id number] TCTS DAILY ICU PROGRESS NOTE                   [address]            [place] [address]          [phone]   2 Days Post-Op Procedure(s) (LRB): CORONARY ARTERY BYPASS GRAFTING (CABG), ON PUMP, TIMES Three, using left internal mammary artery and right greater saphenous vein harvested endoscopically (N/A) TRANSESOPHAGEAL ECHOCARDIOGRAM (TEE) (N/A)  Total Length of Stay:  LOS: 3 days   Subjective: Up to chair , alert, not moving or walking much   Objective: Vital signs in last 24 hours: Temp:  [98.5 F (36.9 C)-99.1 F (37.3 C)] 98.5 F (36.9 C) (10/10 2300) Pulse Rate:  [71-158] 72 (10/11 0700) Cardiac Rhythm: Normal sinus rhythm (10/11 0400) Resp:  [11-27] 13 (10/11 0700) BP: (91-130)/(52-69) 98/56 (10/11 0700) SpO2:  [81 %-100 %] 95 % (10/11 0730) Arterial Line BP: (99-143)/(44-53) 99/50 (10/10 1800) Weight:  [192 lb 14.4 oz (87.5 kg)] 192 lb 14.4 oz (87.5 kg) (10/11 0500)  Filed Weights   05/26/17 1900 05/27/17 0500 05/28/17 0500  Weight: 182 lb 15.7 oz (83 kg) 191 lb 2.2 oz (86.7 kg) 192 lb 14.4 oz (87.5 kg)    Weight change: 9 lb 14.7 oz (4.5 kg)   Hemodynamic parameters for last 24 hours: PAP: (27-30)/(18-19) 27/19  Intake/Output from previous day: 10/10 0701 - 10/11 0700 In: 1032.9 [P.O.:120; I.V.:762.9; IV Piggyback:150] Out: 1125 [Urine:1025; Chest Tube:100]  Intake/Output this shift: No intake/output data recorded.  Current Meds: Scheduled Meds: . acetaminophen  1,000 mg Oral Q6H   Or  . acetaminophen (TYLENOL) oral liquid 160 mg/5 mL  1,000 mg Per Tube Q6H  . aspirin EC  81 mg Oral Daily   Or  . aspirin  81 mg Per Tube Daily  . atorvastatin  80 mg Oral q1800  . bisacodyl  10 mg Oral Daily   Or  . bisacodyl  10 mg Rectal Daily  . bisacodyl  5 mg Oral Once  . buPROPion  150 mg Oral Daily  . Chlorhexidine Gluconate Cloth  6 each Topical Once   And    . Chlorhexidine Gluconate Cloth  6 each Topical Once  . Chlorhexidine Gluconate Cloth  6 each Topical Daily  . docusate sodium  200 mg Oral Daily  . enoxaparin (LOVENOX) injection  30 mg Subcutaneous QHS  . escitalopram  20 mg Oral Daily  . hydrOXYzine  25 mg Oral QHS  . insulin aspart  0-24 Units Subcutaneous Q4H  . levalbuterol  0.63 mg Nebulization TID  . meclizine  25 mg Oral BID  . metoprolol tartrate  12.5 mg Oral BID   Or  . metoprolol tartrate  12.5 mg Per Tube BID  . metoprolol tartrate  12.5 mg Oral Once  . pantoprazole  40 mg Oral Daily  . pentosan polysulfate  200 mg Oral BID  . sodium chloride flush  10-40 mL Intracatheter Q12H  . sodium chloride flush  3 mL Intravenous Q12H   Continuous Infusions: . sodium chloride Stopped (05/27/17 1430)  . sodium chloride    . sodium chloride    . lactated ringers    . lactated ringers 20 mL/hr at 05/27/17 0400  . lactated ringers Stopped (05/27/17 0700)  . nitroGLYCERIN    . phenylephrine (NEO-SYNEPHRINE) Adult infusion Stopped (05/27/17 1901)  PRN Meds:.sodium chloride, ketorolac, lactated ringers, metoprolol tartrate, morphine injection, ondansetron (ZOFRAN) IV, oxyCODONE, sodium chloride flush, sodium chloride flush, traMADol  General appearance: alert, cooperative and no distress Neurologic: intact Heart: regular rate and rhythm, S1, S2 normal, no murmur, click, rub or gallop Lungs: diminished breath sounds bibasilar Abdomen: soft, non-tender; bowel sounds normal; no masses,  no organomegaly Extremities: extremities normal, atraumatic, no cyanosis or edema and Homans sign is negative, no sign of DVT Wound: sternum intact  Lab Results: CBC: Recent Labs  05/27/17 1749 05/28/17 0238  WBC 15.5* 12.2*  HGB 9.4* 8.8*  HCT 28.8* 27.0*  PLT 157 135*   BMET:  Recent Labs  05/27/17 0327  05/27/17 1744 05/27/17 1749 05/28/17 0238  NA 133*  --  134*  --  132*  K 5.0  --  5.4*  --  5.0  CL 103  --  101  --  103   CO2 22  --   --   --  23  GLUCOSE 126*  --  119*  --  123*  BUN 6  --  11  --  13  CREATININE 0.77  < > 0.90 1.00 1.34*  CALCIUM 7.6*  --   --   --  8.0*  < > = values in this interval not displayed.  CMET: Lab Results  Component Value Date   WBC 12.2 (H) 05/28/2017   HGB 8.8 (L) 05/28/2017   HCT 27.0 (L) 05/28/2017   PLT 135 (L) 05/28/2017   GLUCOSE 123 (H) 05/28/2017   CHOL 233 (H) 10/31/2016   TRIG 316.0 (H) 10/31/2016   HDL 48.80 10/31/2016   LDLDIRECT 129.0 10/31/2016   LDLCALC 106 (H) 01/03/2014   ALT 25 01/02/2017   AST 30 01/02/2017   NA 132 (L) 05/28/2017   K 5.0 05/28/2017   CL 103 05/28/2017   CREATININE 1.34 (H) 05/28/2017   BUN 13 05/28/2017   CO2 23 05/28/2017   TSH 1.59 01/13/2017   INR 1.46 05/26/2017   HGBA1C 5.8 02/14/2015      PT/INR:  Recent Labs  05/26/17 1916  LABPROT 17.6*  INR 1.46   Radiology: Dg Chest Port 1 View  Result Date: 05/28/2017 CLINICAL DATA:  Sore chest after cardiac surgery EXAM: PORTABLE CHEST 1 VIEW COMPARISON:  Yesterday FINDINGS: Swan-Ganz catheter and thoracic drains have been removed. No visible pneumothorax. Stable cardiopericardial enlargement. Status post CABG. Stable low lung volumes with left more than right atelectasis. Probable trace left pleural effusion. IMPRESSION: 1. No pneumothorax after thoracic drain removal. 2. Stable atelectasis and trace left effusion. Electronically Signed   By: Monte Fantasia M.D.   On: 05/28/2017 07:48   Acute Kidney Injury (any one)  Increase in SCr by > 0.3 within 48 hours  Increase SCr to > 1.5 times baseline  Urine volume < 0.5 ml/kg/h for 6 hrs  ?Stage 1 - Increase in serum creatinine to 1.5 to 1.9 times baseline, or increase in serum creatinine by ?0.3 mg/dL (?26.5 micromol/L), or reduction in urine output to <0.5 mL/kg per hour for 6 to 12 hours.  ?Stage 2 - Increase in serum creatinine to 2.0 to 2.9 times baseline, or reduction in urine output to <0.5 mL/kg per hour for ?12  hours.  ?Stage 3 - Increase in serum creatinine to 3.0 times baseline, or increase in serum creatinine to ?4.0 mg/dL (?353.6 micromol/L), or reduction in urine output to <0.3 mL/kg per hour for ?24 hours, or anuria for ?12 hours, or the initiation  of renal replacement therapy, or, in patients <18 years, decrease in eGFR to <35 mL   Lab Results  Component Value Date   CREATININE 1.34 (H) 05/28/2017   Estimated Creatinine Clearance: 38.7 mL/min (A) (by C-G formula based on SCr of 1.34 mg/dL (H)).   Assessment/Plan: S/P Procedure(s) (LRB): CORONARY ARTERY BYPASS GRAFTING (CABG), ON PUMP, TIMES Three, using left internal mammary artery and right greater saphenous vein harvested endoscopically (N/A) TRANSESOPHAGEAL ECHOCARDIOGRAM (TEE) (N/A) Mobilize Diuresis PT to start ambulation today  Stage 1 acute kidney injury responded to lasix  Expected Acute  Blood - loss Anemia   [name] 05/28/2017 8:29 AM

## 2017-05-28 NOTE — Progress Notes (Signed)
TCTS BRIEF SICU PROGRESS NOTE  2 Days Post-Op  S/P Procedure(s) (LRB): CORONARY ARTERY BYPASS GRAFTING (CABG), ON PUMP, TIMES Three, using left internal mammary artery and right greater saphenous vein harvested endoscopically (N/A) TRANSESOPHAGEAL ECHOCARDIOGRAM (TEE) (N/A)   Stable day NSR w/ stable BP Breathing comfortably w/ O2 sats 95% on 4 L/min UOP adequate  Plan: Continue current plan  Rexene Alberts, MD 05/28/2017 5:37 PM

## 2017-05-28 NOTE — Progress Notes (Signed)
  Amiodarone Drug - Drug Interaction Consult Note  Recommendations: None, monitor for now.  Amiodarone is metabolized by the cytochrome P450 system and therefore has the potential to cause many drug interactions. Amiodarone has an average plasma half-life of 50 days (range 20 to 100 days).   There is potential for drug interactions to occur several weeks or months after stopping treatment and the onset of drug interactions may be slow after initiating amiodarone.   [x]  Statins: Increased risk of myopathy. Simvastatin- restrict dose to 20mg  daily. Other statins: counsel patients to report any muscle pain or weakness immediately.  []  Anticoagulants: Amiodarone can increase anticoagulant effect. Consider warfarin dose reduction. Patients should be monitored closely and the dose of anticoagulant altered accordingly, remembering that amiodarone levels take several weeks to stabilize.  []  Antiepileptics: Amiodarone can increase plasma concentration of phenytoin, the dose should be reduced. Note that small changes in phenytoin dose can result in large changes in levels. Monitor patient and counsel on signs of toxicity.  [x]  Beta blockers: increased risk of bradycardia, AV block and myocardial depression. Sotalol - avoid concomitant use.  []   Calcium channel blockers (diltiazem and verapamil): increased risk of bradycardia, AV block and myocardial depression.  []   Cyclosporine: Amiodarone increases levels of cyclosporine. Reduced dose of cyclosporine is recommended.  []  Digoxin dose should be halved when amiodarone is started.  []  Diuretics: increased risk of cardiotoxicity if hypokalemia occurs.  []  Oral hypoglycemic agents (glyburide, glipizide, glimepiride): increased risk of hypoglycemia. Patient's glucose levels should be monitored closely when initiating amiodarone therapy.   []  Drugs that prolong the QT interval:  Torsades de pointes risk may be increased with concurrent use - avoid if  possible.  Monitor QTc, also keep magnesium/potassium WNL if concurrent therapy can't be avoided. Marland Kitchen Antibiotics: e.g. fluoroquinolones, erythromycin. . Antiarrhythmics: e.g. quinidine, procainamide, disopyramide, sotalol. . Antipsychotics: e.g. phenothiazines, haloperidol.  . Lithium, tricyclic antidepressants, and methadone. Thank You,  Pat Patrick  05/28/2017 1:46 PM

## 2017-05-29 ENCOUNTER — Inpatient Hospital Stay (HOSPITAL_COMMUNITY): Payer: 59

## 2017-05-29 LAB — BASIC METABOLIC PANEL
Anion gap: 7 (ref 5–15)
BUN: 17 mg/dL (ref 6–20)
CO2: 26 mmol/L (ref 22–32)
Calcium: 8 mg/dL — ABNORMAL LOW (ref 8.9–10.3)
Chloride: 98 mmol/L — ABNORMAL LOW (ref 101–111)
Creatinine, Ser: 1.06 mg/dL — ABNORMAL HIGH (ref 0.44–1.00)
GFR calc Af Amer: 60 mL/min — ABNORMAL LOW (ref >60)
GFR calc non Af Amer: 52 mL/min — ABNORMAL LOW (ref >60)
Glucose, Bld: 119 mg/dL — ABNORMAL HIGH (ref 65–99)
Potassium: 3.7 mmol/L (ref 3.5–5.1)
Sodium: 131 mmol/L — ABNORMAL LOW (ref 135–145)

## 2017-05-29 LAB — CBC
HCT: 22.7 % — ABNORMAL LOW (ref 36.0–46.0)
Hemoglobin: 7.6 g/dL — ABNORMAL LOW (ref 12.0–15.0)
MCH: 30.3 pg (ref 26.0–34.0)
MCHC: 33.5 g/dL (ref 30.0–36.0)
MCV: 90.4 fL (ref 78.0–100.0)
Platelets: 137 10*3/uL — ABNORMAL LOW (ref 150–400)
RBC: 2.51 MIL/uL — ABNORMAL LOW (ref 3.87–5.11)
RDW: 12.5 % (ref 11.5–15.5)
WBC: 11.5 10*3/uL — ABNORMAL HIGH (ref 4.0–10.5)

## 2017-05-29 LAB — GLUCOSE, CAPILLARY
Glucose-Capillary: 108 mg/dL — ABNORMAL HIGH (ref 65–99)
Glucose-Capillary: 115 mg/dL — ABNORMAL HIGH (ref 65–99)
Glucose-Capillary: 124 mg/dL — ABNORMAL HIGH (ref 65–99)
Glucose-Capillary: 108 mg/dL — ABNORMAL HIGH (ref 65–99)
Glucose-Capillary: 126 mg/dL — ABNORMAL HIGH (ref 65–99)

## 2017-05-29 LAB — PREPARE RBC (CROSSMATCH)

## 2017-05-29 LAB — MAGNESIUM: Magnesium: 2.1 mg/dL (ref 1.7–2.4)

## 2017-05-29 MED ORDER — FUROSEMIDE 10 MG/ML IJ SOLN
40.0000 mg | Freq: Once | INTRAMUSCULAR | Status: AC
  Administered 2017-05-29: 40 mg via INTRAVENOUS
  Filled 2017-05-29: qty 4

## 2017-05-29 MED ORDER — POTASSIUM CHLORIDE CRYS ER 20 MEQ PO TBCR
20.0000 meq | EXTENDED_RELEASE_TABLET | ORAL | Status: AC
  Administered 2017-05-29 (×3): 20 meq via ORAL
  Filled 2017-05-29 (×2): qty 1

## 2017-05-29 MED ORDER — FENTANYL CITRATE (PF) 100 MCG/2ML IJ SOLN
50.0000 ug | INTRAMUSCULAR | Status: DC | PRN
  Administered 2017-05-29 – 2017-05-31 (×10): 50 ug via INTRAVENOUS
  Filled 2017-05-29 (×10): qty 2

## 2017-05-29 MED ORDER — SODIUM CHLORIDE 0.9 % IV SOLN
0.0000 ug/min | INTRAVENOUS | Status: DC
  Administered 2017-05-29: 20 ug/min via INTRAVENOUS
  Filled 2017-05-29: qty 2

## 2017-05-29 MED ORDER — AMIODARONE HCL 200 MG PO TABS: 200.0000 mg | ORAL_TABLET | Freq: Every day | ORAL | Status: DC

## 2017-05-29 MED ORDER — OXYCODONE-ACETAMINOPHEN 5-325 MG PO TABS
1.0000 | ORAL_TABLET | ORAL | Status: DC | PRN
  Administered 2017-05-29: 2 via ORAL
  Filled 2017-05-29 (×2): qty 1

## 2017-05-29 MED ORDER — AMIODARONE HCL 200 MG PO TABS
200.0000 mg | ORAL_TABLET | Freq: Two times a day (BID) | ORAL | Status: DC
  Administered 2017-05-29: 200 mg via ORAL
  Filled 2017-05-29: qty 1

## 2017-05-29 NOTE — Progress Notes (Signed)
      [address]       [place] [address]             [phone]      POD # 3 CABG  BP 123/62 (BP Location: Left Arm)   Pulse 78   Temp 98.8 F (37.1 C) (Oral)   Resp 19   Ht 5\' 1"  (1.549 m)   Wt 185 lb 13.6 oz (84.3 kg)   LMP  (LMP Unknown)   SpO2 100%   BMI 35.12 kg/m   Intake/Output Summary (Last 24 hours) at 05/29/17 1731 Last data filed at 05/29/17 1700  Gross per 24 hour  Intake          1261.49 ml  Output             1365 ml  Net          -103.51 ml   CBG well controlled  Remo Lipps C. Roxan Hockey, MD [facility] [phone]

## 2017-05-29 NOTE — Progress Notes (Addendum)
Patient ID: Bianca [name], female   DOB: November 13, 1945, 71 y.o.   MRN: [id number] TCTS DAILY ICU PROGRESS NOTE                   [address]            [place] [address]          [phone]   3 Days Post-Op Procedure(s) (LRB): CORONARY ARTERY BYPASS GRAFTING (CABG), ON PUMP, TIMES Three, using left internal mammary artery and right greater saphenous vein harvested endoscopically (N/A) TRANSESOPHAGEAL ECHOCARDIOGRAM (TEE) (N/A)  Total Length of Stay:  LOS: 4 days   Subjective: Up to chair, still 3 person lift to get her up   Objective: Vital signs in last 24 hours: Temp:  [97.7 F (36.5 C)-98.9 F (37.2 C)] 98 F (36.7 C) (10/12 0830) Pulse Rate:  [57-137] 69 (10/12 0830) Cardiac Rhythm: Normal sinus rhythm (10/12 0829) Resp:  [10-31] 16 (10/12 0830) BP: (70-128)/(44-93) 98/57 (10/12 0829) SpO2:  [92 %-100 %] 100 % (10/12 0830) Weight:  [185 lb 13.6 oz (84.3 kg)] 185 lb 13.6 oz (84.3 kg) (10/12 0500)  Filed Weights   05/27/17 0500 05/28/17 0500 05/29/17 0500  Weight: 191 lb 2.2 oz (86.7 kg) 192 lb 14.4 oz (87.5 kg) 185 lb 13.6 oz (84.3 kg)    Weight change: -7 lb 0.9 oz (-3.2 kg)   Hemodynamic parameters for last 24 hours:    Intake/Output from previous day: 10/11 0701 - 10/12 0700 In: 720.8 [I.V.:720.8] Out: 1310 [Urine:1310]  Intake/Output this shift: Total I/O In: 426.6 [I.V.:426.6] Out: 60 [Urine:60]  Current Meds: Scheduled Meds: . acetaminophen  1,000 mg Oral Q6H   Or  . acetaminophen (TYLENOL) oral liquid 160 mg/5 mL  1,000 mg Per Tube Q6H  . amiodarone  200 mg Oral Q12H   Followed by  . [START ON 06/05/2017] amiodarone  200 mg Oral Daily  . aspirin EC  81 mg Oral Daily   Or  . aspirin  81 mg Per Tube Daily  . atorvastatin  80 mg Oral q1800  . baclofen  10 mg Oral QHS  . bisacodyl  10 mg Oral Daily   Or  . bisacodyl  10 mg Rectal Daily  . bisacodyl  5 mg Oral Once  . buPROPion  150 mg Oral Daily  . Chlorhexidine Gluconate Cloth   6 each Topical Once   And  . Chlorhexidine Gluconate Cloth  6 each Topical Once  . Chlorhexidine Gluconate Cloth  6 each Topical Daily  . Chlorhexidine Gluconate Cloth  6 each Topical Daily  . docusate sodium  200 mg Oral Daily  . enoxaparin (LOVENOX) injection  30 mg Subcutaneous QHS  . escitalopram  20 mg Oral Daily  . gabapentin  300 mg Oral QHS  . hydrOXYzine  25 mg Oral QHS  . insulin aspart  0-24 Units Subcutaneous Q4H  . levalbuterol  0.63 mg Nebulization TID  . meclizine  25 mg Oral BID  . metoprolol tartrate  12.5 mg Oral BID   Or  . metoprolol tartrate  12.5 mg Per Tube BID  . metoprolol tartrate  12.5 mg Oral Once  . pantoprazole  40 mg Oral Daily  . pentosan polysulfate  200 mg Oral BID  . potassium chloride  20 mEq Oral Q4H  . sodium chloride flush  10-40 mL Intracatheter Q12H  . sodium chloride flush  3 mL Intravenous Q12H   Continuous Infusions: . sodium chloride Stopped (05/27/17  1430)  . sodium chloride    . sodium chloride    . amiodarone 30 mg/hr (05/29/17 0821)  . lactated ringers    . lactated ringers Stopped (05/28/17 2300)  . lactated ringers Stopped (05/27/17 0700)  . phenylephrine (NEO-SYNEPHRINE) Adult infusion 10 mcg/min (05/29/17 0800)   PRN Meds:.sodium chloride, fentaNYL (SUBLIMAZE) injection, ketorolac, lactated ringers, metoprolol tartrate, ondansetron (ZOFRAN) IV, oxyCODONE, sodium chloride flush, sodium chloride flush, sodium chloride flush, traMADol  General appearance: alert, cooperative, appears older than stated age and no distress Neurologic: intact Heart: regular rate and rhythm, S1, S2 normal, no murmur, click, rub or gallop Lungs: diminished breath sounds bibasilar Abdomen: soft, non-tender; bowel sounds normal; no masses,  no organomegaly Extremities: extremities normal, atraumatic, no cyanosis or edema and Homans sign is negative, no sign of DVT Wound: sternum ok  Lab Results: CBC: Recent Labs  05/28/17 0238 05/29/17 0346    WBC 12.2* 11.5*  HGB 8.8* 7.6*  HCT 27.0* 22.7*  PLT 135* 137*   BMET:  Recent Labs  05/28/17 0238 05/29/17 0346  NA 132* 131*  K 5.0 3.7  CL 103 98*  CO2 23 26  GLUCOSE 123* 119*  BUN 13 17  CREATININE 1.34* 1.06*  CALCIUM 8.0* 8.0*    CMET: Lab Results  Component Value Date   WBC 11.5 (H) 05/29/2017   HGB 7.6 (L) 05/29/2017   HCT 22.7 (L) 05/29/2017   PLT 137 (L) 05/29/2017   GLUCOSE 119 (H) 05/29/2017   CHOL 233 (H) 10/31/2016   TRIG 316.0 (H) 10/31/2016   HDL 48.80 10/31/2016   LDLDIRECT 129.0 10/31/2016   LDLCALC 106 (H) 01/03/2014   ALT 25 01/02/2017   AST 30 01/02/2017   NA 131 (L) 05/29/2017   K 3.7 05/29/2017   CL 98 (L) 05/29/2017   CREATININE 1.06 (H) 05/29/2017   BUN 17 05/29/2017   CO2 26 05/29/2017   TSH 5.685 (H) 05/28/2017   INR 1.46 05/26/2017   HGBA1C 5.8 02/14/2015      PT/INR:  Recent Labs  05/26/17 1916  LABPROT 17.6*  INR 1.46   Radiology: Dg Chest Port 1 View  Result Date: 05/29/2017 CLINICAL DATA:  Sore chest.  CABG. EXAM: PORTABLE CHEST 1 VIEW COMPARISON:  05/28/2017 . FINDINGS: Interim removal of right IJ sheath. PICC line noted with its tip at the cavoatrial junction. Prior CABG. Cardiomegaly with mild bilateral interstitial prominence. Very mild component CHF cannot be excluded. No prominent pleural effusion. No pneumothorax . IMPRESSION: 1. Interim removal right IJ sheath. Right PICC line stable position. 2. Prior CABG. Cardiomegaly with mild bilateral interstitial prominence. Mild CHF cannot be excluded . Electronically Signed   By: Marcello Moores  Register   On: 05/29/2017 07:17     Assessment/Plan: S/P Procedure(s) (LRB): CORONARY ARTERY BYPASS GRAFTING (CABG), ON PUMP, TIMES Three, using left internal mammary artery and right greater saphenous vein harvested endoscopically (N/A) TRANSESOPHAGEAL ECHOCARDIOGRAM (TEE) (N/A) Mobilize Diuresis vomited this am so will not stop iv amniodrone yet Still on low dose neo, H/H is low  so will transfuse today  Pt seeing , likely will need snf or rehab  Recurrent afib last night , now back in sinus    [name] 05/29/2017 10:32 AM

## 2017-05-29 NOTE — Plan of Care (Signed)
Problem: Pain Managment: Goal: General experience of comfort will improve Outcome: Progressing Pt back on home med regimen of gabapentin and baclofen. New orders for fentanyl PRN. Pt experiencing some relief.

## 2017-05-30 LAB — CBC
HCT: 25.1 % — ABNORMAL LOW (ref 36.0–46.0)
Hemoglobin: 8.4 g/dL — ABNORMAL LOW (ref 12.0–15.0)
MCH: 29.7 pg (ref 26.0–34.0)
MCHC: 33.5 g/dL (ref 30.0–36.0)
MCV: 88.7 fL (ref 78.0–100.0)
Platelets: 156 10*3/uL (ref 150–400)
RBC: 2.83 MIL/uL — ABNORMAL LOW (ref 3.87–5.11)
RDW: 14.3 % (ref 11.5–15.5)
WBC: 10.3 10*3/uL (ref 4.0–10.5)

## 2017-05-30 LAB — GLUCOSE, CAPILLARY
Glucose-Capillary: 101 mg/dL — ABNORMAL HIGH (ref 65–99)
Glucose-Capillary: 103 mg/dL — ABNORMAL HIGH (ref 65–99)
Glucose-Capillary: 115 mg/dL — ABNORMAL HIGH (ref 65–99)
Glucose-Capillary: 118 mg/dL — ABNORMAL HIGH (ref 65–99)
Glucose-Capillary: 99 mg/dL (ref 65–99)

## 2017-05-30 LAB — BASIC METABOLIC PANEL
Anion gap: 10 (ref 5–15)
BUN: 13 mg/dL (ref 6–20)
CO2: 26 mmol/L (ref 22–32)
Calcium: 8 mg/dL — ABNORMAL LOW (ref 8.9–10.3)
Chloride: 95 mmol/L — ABNORMAL LOW (ref 101–111)
Creatinine, Ser: 0.8 mg/dL (ref 0.44–1.00)
GFR calc Af Amer: >60 mL/min (ref >60)
GFR calc non Af Amer: >60 mL/min (ref >60)
Glucose, Bld: 187 mg/dL — ABNORMAL HIGH (ref 65–99)
Potassium: 3.7 mmol/L (ref 3.5–5.1)
Sodium: 131 mmol/L — ABNORMAL LOW (ref 135–145)

## 2017-05-30 LAB — BPAM RBC
Blood Product Expiration Date: 201810172359
Blood Product Expiration Date: 201810302359
ISSUE DATE / TIME: 201810091350
ISSUE DATE / TIME: 201810121119
Unit Type and Rh: 5100
Unit Type and Rh: 5100

## 2017-05-30 LAB — TYPE AND SCREEN
ABO/RH(D): O POS
Antibody Screen: NEGATIVE
UNIT DIVISION: 0
UNIT DIVISION: 0

## 2017-05-30 MED ORDER — AMIODARONE HCL 200 MG PO TABS
400.0000 mg | ORAL_TABLET | Freq: Two times a day (BID) | ORAL | Status: DC
  Administered 2017-05-30 – 2017-06-01 (×5): 400 mg via ORAL
  Filled 2017-05-30 (×5): qty 2

## 2017-05-30 MED ORDER — POTASSIUM CHLORIDE 10 MEQ/50ML IV SOLN
10.0000 meq | INTRAVENOUS | Status: AC | PRN
  Administered 2017-05-30 – 2017-06-01 (×5): 10 meq via INTRAVENOUS
  Filled 2017-05-30 (×4): qty 50

## 2017-05-30 NOTE — Clinical Social Work Placement (Signed)
   CLINICAL SOCIAL WORK PLACEMENT  NOTE  Date:  05/30/2017  Patient Details  Name: MARY SECORD MRN: [id number] Date of Birth: [birth year]  Clinical Social Work is seeking post-discharge placement for this patient at the [place] level of care (*CSW will initial, date and re-position this form in  chart as items are completed):  Yes   Patient/family provided with [facility] Work Department's list of facilities offering this level of care within the geographic area requested by the patient (or if unable, by the patient's family).  Yes   Patient/family informed of their freedom to choose among providers that offer the needed level of care, that participate in Medicare, Medicaid or managed care program needed by the patient, have an available bed and are willing to accept the patient.  Yes   Patient/family informed of [facility]'s ownership interest in [facility] and [facility], as well as of the fact that they are under no obligation to receive care at these facilities.  PASRR submitted to EDS on 05/30/17     PASRR number received on       Existing PASRR number confirmed on       FL2 transmitted to all facilities in geographic area requested by pt/family on 05/30/17     FL2 transmitted to all facilities within larger geographic area on       Patient informed that his/her managed care company has contracts with or will negotiate with certain facilities, including the following:            Patient/family informed of bed offers received.  Patient chooses bed at       Physician recommends and patient chooses bed at      Patient to be transferred to   on  .  Patient to be transferred to facility by       Patient family notified on   of transfer.  Name of family member notified:        PHYSICIAN Please sign FL2     Additional Comment:    _______________________________________________ Candie Chroman, LCSW 05/30/2017, 10:30  AM

## 2017-05-30 NOTE — Progress Notes (Signed)
      [address]       [place] [address]             [phone]      Still has some pain  BP (!) 110/58   Pulse 66   Temp 98.4 F (36.9 C) (Oral)   Resp 18   Ht 5\' 1"  (1.549 m)   Wt 193 lb 9.6 oz (87.8 kg)   LMP  (LMP Unknown)   SpO2 100%   BMI 36.58 kg/m    Intake/Output Summary (Last 24 hours) at 05/30/17 1747 Last data filed at 05/30/17 1657  Gross per 24 hour  Intake            586.3 ml  Output              961 ml  Net           -374.7 ml    No new issues today  Continue to mobilize as tolerated  Remo Lipps C. Roxan Hockey, MD [facility] [phone]

## 2017-05-30 NOTE — NC FL2 (Signed)
[place] LEVEL OF CARE SCREENING TOOL     IDENTIFICATION  Patient Name: Bianca [name] Birthdate: 11-30-1945 Sex: female Admission Date (Current Location): 05/25/2017  [facility] and [place] Number:  Herbalist and Address:  [facility] [place] [place] [address], [place], [place] [address]      Provider Number: [id number]  Attending Physician Name and Address:  [name], MD  Relative Name and Phone Number:       Current Level of Care: Hospital Recommended Level of Care: [place] Prior Approval Number:    Date Approved/Denied:   PASRR Number: Manual review  Discharge Plan: SNF    Current Diagnoses: Patient Active Problem List   Diagnosis Date Noted  . S/P CABG x 3 05/26/2017  . Chest pain in adult 05/25/2017  . RAD (reactive airway disease) 04/01/2016  . Dyspnea 02/20/2016  . Bradycardia 01/27/2016  . B12 deficiency 07/11/2015  . Anemia associated with acute blood loss 05/28/2014  . Cigarette smoker 05/28/2014  . OA (osteoarthritis) of hip 05/24/2014  . Chest pain 12/19/2013  . History of non-ST elevation myocardial infarction (NSTEMI) 05/18/2012  . Allergic rhinitis due to pollen 04/01/2012  . Back pain 11/22/2010  . NEOPLASM OF UNCERTAIN BEHAVIOR OF SKIN 06/05/2010  . Swallowing difficulty 06/04/2010  . GERD 07/30/2009  . Hyperlipidemia 03/02/2007  . Depression 03/02/2007  . Coronary artery disease involving native coronary artery of native heart with unstable angina pectoris ([place]) 03/02/2007    Orientation RESPIRATION BLADDER Height & Weight     Self, Time, Situation, Place  O2 (Nasal Canula 2 L) Incontinent, Indwelling catheter Weight: 193 lb 9.6 oz (87.8 kg) Height:  5\' 1"  (154.9 cm)  BEHAVIORAL SYMPTOMS/MOOD NEUROLOGICAL BOWEL NUTRITION STATUS  Other (Comment) (Calm, Cooperative.)  (None) Continent Diet (Heart healthy/carb modified)  AMBULATORY STATUS COMMUNICATION OF NEEDS Skin   Limited  Assist Verbally Bruising, Surgical wounds                       Personal Care Assistance Level of Assistance  Bathing, Feeding, Dressing Bathing Assistance: Limited assistance Feeding assistance: Independent Dressing Assistance: Limited assistance     Functional Limitations Info  Sight, Hearing, Speech Sight Info: Adequate Hearing Info: Adequate Speech Info: Adequate    SPECIAL CARE FACTORS FREQUENCY  PT (By licensed PT), OT (By licensed OT)     PT Frequency: 5 x week OT Frequency: 5 x week            Contractures Contractures Info: Not present    Additional Factors Info  Code Status, Allergies, Psychotropic Code Status Info: Full Allergies Info: Oxycodone Hcl, Penicillins, Macrobid (Nitrofurantoin Monohyd Macro), Sulfa Antibiotics, Tramadol, Aspirin Psychotropic Info: Depression: Wellbutrin XL 150 mg PO daily, Lexapro 20 mg PO daily, Hydroxyzine 25 mg PO QHS.         Current Medications (05/30/2017):  This is the current hospital active medication list Current Facility-Administered Medications  Medication Dose Route Frequency Provider Last Rate Last Dose  . 0.45 % sodium chloride infusion   Intravenous Continuous PRN Barrett, Erin R, PA-C   Stopped at 05/27/17 1430  . 0.9 %  sodium chloride infusion  250 mL Intravenous Continuous Barrett, Erin R, PA-C      . 0.9 %  sodium chloride infusion   Intravenous Continuous Barrett, Erin R, PA-C      . amiodarone (NEXTERONE PREMIX) 360-4.14 MG/200ML-% (1.8 mg/mL) IV infusion  30 mg/hr Intravenous Continuous Hendrickson,  Revonda Standard, MD 16.7 mL/hr at 05/30/17 0800 30 mg/hr at 05/30/17 0800  . amiodarone (PACERONE) tablet 400 mg  400 mg Oral BID Melrose Nakayama, MD   400 mg at 05/30/17 0951  . aspirin EC tablet 81 mg  81 mg Oral Daily [name], MD   81 mg at 05/30/17 4128   Or  . aspirin chewable tablet 81 mg  81 mg Per Tube Daily [name], MD      . atorvastatin (LIPITOR) tablet 80 mg  80 mg Oral  q1800 Belva Crome, MD   80 mg at 05/29/17 2001  . baclofen (LIORESAL) tablet 10 mg  10 mg Oral QHS Rexene Alberts, MD   10 mg at 05/29/17 2131  . bisacodyl (DULCOLAX) EC tablet 10 mg  10 mg Oral Daily Barrett, Erin R, PA-C   10 mg at 05/30/17 7867   Or  . bisacodyl (DULCOLAX) suppository 10 mg  10 mg Rectal Daily Barrett, Erin R, PA-C      . bisacodyl (DULCOLAX) EC tablet 5 mg  5 mg Oral Once [name], MD      . buPROPion (WELLBUTRIN XL) 24 hr tablet 150 mg  150 mg Oral Daily Jettie Booze E, NP   150 mg at 05/30/17 0951  . Chlorhexidine Gluconate Cloth 2 % PADS 6 each  6 each Topical Once [name], MD       And  . Chlorhexidine Gluconate Cloth 2 % PADS 6 each  6 each Topical Once [name], MD      . Chlorhexidine Gluconate Cloth 2 % PADS 6 each  6 each Topical Daily [name], MD   6 each at 05/29/17 1000  . Chlorhexidine Gluconate Cloth 2 % PADS 6 each  6 each Topical Daily [name], MD   6 each at 05/30/17 1000  . docusate sodium (COLACE) capsule 200 mg  200 mg Oral Daily Barrett, Erin R, PA-C   200 mg at 05/30/17 0950  . enoxaparin (LOVENOX) injection 30 mg  30 mg Subcutaneous QHS [name], MD   30 mg at 05/29/17 2132  . escitalopram (LEXAPRO) tablet 20 mg  20 mg Oral Daily Jettie Booze E, NP   20 mg at 05/30/17 0950  . fentaNYL (SUBLIMAZE) injection 50 mcg  50 mcg Intravenous Q1H PRN Melrose Nakayama, MD   50 mcg at 05/30/17 0440  . gabapentin (NEURONTIN) capsule 300 mg  300 mg Oral QHS Rexene Alberts, MD   300 mg at 05/29/17 2132  . hydrOXYzine (ATARAX/VISTARIL) tablet 25 mg  25 mg Oral QHS Jettie Booze E, NP   25 mg at 05/29/17 2132  . insulin aspart (novoLOG) injection 0-24 Units  0-24 Units Subcutaneous Q4H [name], MD   2 Units at 05/29/17 2001  . lactated ringers infusion 500 mL  500 mL Intravenous Once PRN Barrett, Erin R, PA-C      . lactated ringers infusion   Intravenous Continuous Barrett, Erin R, PA-C    Stopped at 05/28/17 2300  . lactated ringers infusion   Intravenous Continuous Barrett, Erin R, PA-C   Stopped at 05/27/17 0700  . levalbuterol (XOPENEX) nebulizer solution 0.63 mg  0.63 mg Nebulization TID Barrett, Erin R, PA-C   0.63 mg at 05/30/17 0746  . meclizine (ANTIVERT) tablet 25 mg  25 mg Oral BID Jettie Booze E, NP   25 mg at 05/30/17 0950  . metoprolol tartrate (  LOPRESSOR) tablet 12.5 mg  12.5 mg Oral BID Barrett, Erin R, PA-C   12.5 mg at 05/30/17 0951   Or  . metoprolol tartrate (LOPRESSOR) 25 mg/10 mL oral suspension 12.5 mg  12.5 mg Per Tube BID Barrett, Erin R, PA-C      . metoprolol tartrate (LOPRESSOR) injection 2.5-5 mg  2.5-5 mg Intravenous Q2H PRN Barrett, Erin R, PA-C      . metoprolol tartrate (LOPRESSOR) tablet 12.5 mg  12.5 mg Oral Once [name], MD      . ondansetron [facility]) injection 4 mg  4 mg Intravenous Q6H PRN Barrett, Erin R, PA-C   4 mg at 05/29/17 1100  . oxyCODONE-acetaminophen (PERCOCET/ROXICET) 5-325 MG per tablet 1-2 tablet  1-2 tablet Oral Q4H PRN Melrose Nakayama, MD   2 tablet at 05/29/17 2001  . pantoprazole (PROTONIX) EC tablet 40 mg  40 mg Oral Daily Barrett, Erin R, PA-C   40 mg at 05/30/17 0950  . pentosan polysulfate (ELMIRON) capsule 200 mg  200 mg Oral BID Jettie Booze E, NP   200 mg at 05/30/17 1008  . phenylephrine (NEO-SYNEPHRINE) 20 mg in sodium chloride 0.9 % 250 mL (0.08 mg/mL) infusion  0-400 mcg/min Intravenous Titrated Rexene Alberts, MD   Stopped at 05/29/17 1121  . potassium chloride 10 mEq in 50 mL *CENTRAL LINE* IVPB  10 mEq Intravenous Q1H PRN [name], MD   Stopped at 05/30/17 [phone]  . sodium chloride flush (NS) 0.9 % injection 10-40 mL  10-40 mL Intracatheter Q12H [name], MD   10 mL at 05/30/17 0952  . sodium chloride flush (NS) 0.9 % injection 10-40 mL  10-40 mL Intracatheter PRN [name], MD      . sodium chloride flush (NS) 0.9 % injection 10-40 mL  10-40 mL Intracatheter PRN [name], MD      . sodium chloride flush (NS) 0.9 % injection 3 mL  3 mL Intravenous Q12H Barrett, Erin R, PA-C   3 mL at 05/30/17 0952  . sodium chloride flush (NS) 0.9 % injection 3 mL  3 mL Intravenous PRN Barrett, Erin R, PA-C      . traMADol (ULTRAM) tablet 50 mg  50 mg Oral Q6H PRN [name], MD         Discharge Medications: Please see discharge summary for a list of discharge medications.  Relevant Imaging Results:  Relevant Lab Results:   Additional Information SS#: [id number]  Candie Chroman, LCSW

## 2017-05-30 NOTE — Clinical Social Work Note (Signed)
Clinical Social Work Assessment  Patient Details  Name: Bianca [name] MRN: [id number] Date of Birth: 28-Oct-1945  Date of referral:  05/30/17               Reason for consult:  Facility Placement, Discharge Planning                Permission sought to share information with:  Chartered certified accountant granted to share information::  Yes, Verbal Permission Granted  Name::        Agency::  SNF's  Relationship::     Contact Information:     Housing/Transportation Living arrangements for the past 2 months:  Single Family Home Source of Information:  Patient, Medical Team Patient Interpreter Needed:  None Criminal Activity/Legal Involvement Pertinent to Current Situation/Hospitalization:  No - Comment as needed Significant Relationships:  Spouse, Adult Children Lives with:  Spouse Do you feel safe going back to the place where you live?  Yes Need for family participation in patient care:  Yes (Comment)  Care giving concerns:  PT recommending SNF placement once stable for discharge.   Social Worker assessment / plan:  CSW met with patient. No supports at bedside. CSW introduced role and explained that PT recommendations would be discussed. When asked if she was interested in SNF, patient replied, "I'm not sure. Probably." Patient wants to discuss with her husband. SNF list provided for them to review together. Patient's PASARR under manual review. Patient cannot go to SNF until PASARR obtained. No further concerns. CSW encouraged patient to contact CSW as needed. CSW will continue to follow patient for support and facilitate discharge to SNF once medically stable.  Employment status:  Retired Forensic scientist:  Other (Comment Required) Scientist, clinical (histocompatibility and immunogenetics)) PT Recommendations:  [place] / Referral to community resources:  [place]  Patient/Family's Response to care:  Patient willing to consider SNF. Patient's husband and daughter  supportive and involved in patient's care. Patient appreciated social work intervention.  Patient/Family's Understanding of and Emotional Response to Diagnosis, Current Treatment, and Prognosis:  Patient has a good understanding of the reason for admission and PT recommendations for SNF. Patient appears happy with hospital care.  Emotional Assessment Appearance:  Appears stated age Attitude/Demeanor/Rapport:  Other (Pleasant) Affect (typically observed):  Accepting, Appropriate, Calm, Pleasant Orientation:  Oriented to Self, Oriented to Place, Oriented to  Time, Oriented to Situation Alcohol / Substance use:  Tobacco Use Psych involvement (Current and /or in the community):  No (Comment)  Discharge Needs  Concerns to be addressed:  Care Coordination Readmission within the last 30 days:  No Current discharge risk:  Dependent with Mobility Barriers to Discharge:  Continued Medical Work up, Scientist, research (life sciences)), [place], LCSW 05/30/2017, 10:28 AM

## 2017-05-30 NOTE — Progress Notes (Signed)
4 Days Post-Op Procedure(s) (LRB): CORONARY ARTERY BYPASS GRAFTING (CABG), ON PUMP, TIMES Three, using left internal mammary artery and right greater saphenous vein harvested endoscopically (N/A) TRANSESOPHAGEAL ECHOCARDIOGRAM (TEE) (N/A) Subjective: Pain better this AM Nausea improved  Objective: Vital signs in last 24 hours: Temp:  [97.7 F (36.5 C)-99.4 F (37.4 C)] 98.5 F (36.9 C) (10/13 0300) Pulse Rate:  [66-81] 75 (10/13 0800) Cardiac Rhythm: Normal sinus rhythm (10/13 0400) Resp:  [14-21] 18 (10/13 0745) BP: (88-136)/(51-87) 103/55 (10/13 0800) SpO2:  [95 %-100 %] 100 % (10/13 0800) FiO2 (%):  [32 %] 32 % (10/13 0746) Weight:  [193 lb 9.6 oz (87.8 kg)] 193 lb 9.6 oz (87.8 kg) (10/13 0500)  Hemodynamic parameters for last 24 hours: CVP:  [16 mmHg] 16 mmHg  Intake/Output from previous day: 10/12 0701 - 10/13 0700 In: 1383.9 [P.O.:120; I.V.:833.9; Blood:330; IV Piggyback:100] Out: 1860 [AQTMA:2633] Intake/Output this shift: No intake/output data recorded.  General appearance: alert, cooperative and no distress Neurologic: intact Heart: regular rate and rhythm Lungs: diminished breath sounds bibasilar Abdomen: normal findings: soft, non-tender  Lab Results:  Recent Labs  05/29/17 0346 05/30/17 0359  WBC 11.5* 10.3  HGB 7.6* 8.4*  HCT 22.7* 25.1*  PLT 137* 156   BMET:  Recent Labs  05/29/17 0346 05/30/17 0359  NA 131* 131*  K 3.7 3.7  CL 98* 95*  CO2 26 26  GLUCOSE 119* 187*  BUN 17 13  CREATININE 1.06* 0.80  CALCIUM 8.0* 8.0*    PT/INR: No results for input(s): LABPROT, INR in the last 72 hours. ABG    Component Value Date/Time   PHART 7.253 (L) 05/27/2017 0615   HCO3 22.3 05/27/2017 0615   TCO2 24 05/27/2017 1744   ACIDBASEDEF 5.0 (H) 05/27/2017 0615   O2SAT 97.0 05/27/2017 0615   CBG (last 3)   Recent Labs  05/29/17 1714 05/29/17 1954 05/30/17 0004  GLUCAP 108* 126* 101*    Assessment/Plan: S/P Procedure(s) (LRB): CORONARY  ARTERY BYPASS GRAFTING (CABG), ON PUMP, TIMES Three, using left internal mammary artery and right greater saphenous vein harvested endoscopically (N/A) TRANSESOPHAGEAL ECHOCARDIOGRAM (TEE) (N/A) -  CV- in SR- change to PO amiodarone  RESP- IS and flutter for atelectasis  RENAL- creatinine improved, supplement K  ENDO- CBG well controlled  Deconditioning- continue to mobilize as tolerated  Pain control better this AM   LOS: 5 days    Melrose Nakayama 05/30/2017

## 2017-05-31 ENCOUNTER — Inpatient Hospital Stay (HOSPITAL_COMMUNITY): Payer: 59

## 2017-05-31 DIAGNOSIS — Z951 Presence of aortocoronary bypass graft: Secondary | ICD-10-CM

## 2017-05-31 LAB — GLUCOSE, CAPILLARY
Glucose-Capillary: 102 mg/dL — ABNORMAL HIGH (ref 65–99)
Glucose-Capillary: 108 mg/dL — ABNORMAL HIGH (ref 65–99)
Glucose-Capillary: 110 mg/dL — ABNORMAL HIGH (ref 65–99)
Glucose-Capillary: 111 mg/dL — ABNORMAL HIGH (ref 65–99)

## 2017-05-31 LAB — CBC
HCT: 25.1 % — ABNORMAL LOW (ref 36.0–46.0)
Hemoglobin: 8.4 g/dL — ABNORMAL LOW (ref 12.0–15.0)
MCH: 30.2 pg (ref 26.0–34.0)
MCHC: 33.5 g/dL (ref 30.0–36.0)
MCV: 90.3 fL (ref 78.0–100.0)
Platelets: 172 10*3/uL (ref 150–400)
RBC: 2.78 MIL/uL — ABNORMAL LOW (ref 3.87–5.11)
RDW: 14.7 % (ref 11.5–15.5)
WBC: 8.6 10*3/uL (ref 4.0–10.5)

## 2017-05-31 LAB — BASIC METABOLIC PANEL
Anion gap: 7 (ref 5–15)
BUN: 12 mg/dL (ref 6–20)
CO2: 27 mmol/L (ref 22–32)
Calcium: 8.1 mg/dL — ABNORMAL LOW (ref 8.9–10.3)
Chloride: 98 mmol/L — ABNORMAL LOW (ref 101–111)
Creatinine, Ser: 0.81 mg/dL (ref 0.44–1.00)
GFR calc Af Amer: >60 mL/min (ref >60)
GFR calc non Af Amer: >60 mL/min (ref >60)
Glucose, Bld: 108 mg/dL — ABNORMAL HIGH (ref 65–99)
Potassium: 3.8 mmol/L (ref 3.5–5.1)
Sodium: 132 mmol/L — ABNORMAL LOW (ref 135–145)

## 2017-05-31 MED ORDER — CLOPIDOGREL BISULFATE 75 MG PO TABS
75.0000 mg | ORAL_TABLET | Freq: Every day | ORAL | Status: DC
  Administered 2017-05-31 – 2017-06-05 (×6): 75 mg via ORAL
  Filled 2017-05-31 (×6): qty 1

## 2017-05-31 MED ORDER — FUROSEMIDE 40 MG PO TABS
40.0000 mg | ORAL_TABLET | Freq: Every day | ORAL | Status: DC
  Administered 2017-05-31 – 2017-06-05 (×6): 40 mg via ORAL
  Filled 2017-05-31 (×6): qty 1

## 2017-05-31 MED ORDER — POTASSIUM CHLORIDE CRYS ER 20 MEQ PO TBCR
20.0000 meq | EXTENDED_RELEASE_TABLET | Freq: Two times a day (BID) | ORAL | Status: DC
  Administered 2017-05-31 – 2017-06-03 (×8): 20 meq via ORAL
  Filled 2017-05-31 (×8): qty 1

## 2017-05-31 MED ORDER — ORAL CARE MOUTH RINSE
15.0000 mL | Freq: Two times a day (BID) | OROMUCOSAL | Status: DC
  Administered 2017-05-31 – 2017-06-04 (×7): 15 mL via OROMUCOSAL

## 2017-05-31 MED ORDER — INSULIN ASPART 100 UNIT/ML ~~LOC~~ SOLN
0.0000 [IU] | Freq: Three times a day (TID) | SUBCUTANEOUS | Status: DC
  Administered 2017-06-01: 2 [IU] via SUBCUTANEOUS

## 2017-05-31 MED ORDER — ACETAMINOPHEN 500 MG PO TABS: 1000.0000 mg | ORAL_TABLET | Freq: Four times a day (QID) | ORAL | Status: DC | PRN

## 2017-05-31 NOTE — Progress Notes (Signed)
5 Days Post-Op Procedure(s) (LRB): CORONARY ARTERY BYPASS GRAFTING (CABG), ON PUMP, TIMES Three, using left internal mammary artery and right greater saphenous vein harvested endoscopically (N/A) TRANSESOPHAGEAL ECHOCARDIOGRAM (TEE) (N/A) Subjective: No complaints currently Ambulated better this AM  Objective: Vital signs in last 24 hours: Temp:  [97.7 F (36.5 C)-99 F (37.2 C)] 98.4 F (36.9 C) (10/14 0823) Pulse Rate:  [66-84] 74 (10/14 0800) Cardiac Rhythm: Normal sinus rhythm (10/14 0800) Resp:  [14-24] 17 (10/14 0800) BP: (86-144)/(54-73) 114/61 (10/14 0800) SpO2:  [93 %-100 %] 98 % (10/14 0800) FiO2 (%):  [28 %] 28 % (10/14 0750) Weight:  [190 lb 11.2 oz (86.5 kg)] 190 lb 11.2 oz (86.5 kg) (10/14 0500)  Hemodynamic parameters for last 24 hours:    Intake/Output from previous day: 10/13 0701 - 10/14 0700 In: 822.5 [P.O.:650; I.V.:122.5; IV Piggyback:50] Out: 1856 [Urine:1040; Stool:1] Intake/Output this shift: No intake/output data recorded.  General appearance: alert, cooperative and no distress Neurologic: intact Heart: regular rate and rhythm Lungs: diminished breath sounds bibasilar and wheezes bibasilar Abdomen: normal findings: soft, non-tender  Lab Results:  Recent Labs  05/30/17 0359 05/31/17 0600  WBC 10.3 8.6  HGB 8.4* 8.4*  HCT 25.1* 25.1*  PLT 156 172   BMET:  Recent Labs  05/30/17 0359 05/31/17 0600  NA 131* 132*  K 3.7 3.8  CL 95* 98*  CO2 26 27  GLUCOSE 187* 108*  BUN 13 12  CREATININE 0.80 0.81  CALCIUM 8.0* 8.1*    PT/INR: No results for input(s): LABPROT, INR in the last 72 hours. ABG    Component Value Date/Time   PHART 7.253 (L) 05/27/2017 0615   HCO3 22.3 05/27/2017 0615   TCO2 24 05/27/2017 1744   ACIDBASEDEF 5.0 (H) 05/27/2017 0615   O2SAT 97.0 05/27/2017 0615   CBG (last 3)   Recent Labs  05/30/17 2046 05/31/17 0409 05/31/17 0817  GLUCAP 99 102* 108*    Assessment/Plan: S/P Procedure(s) (LRB): CORONARY  ARTERY BYPASS GRAFTING (CABG), ON PUMP, TIMES Three, using left internal mammary artery and right greater saphenous vein harvested endoscopically (N/A) TRANSESOPHAGEAL ECHOCARDIOGRAM (TEE) (N/A) -  CV- stable in SR  RESP- wheezing this AM, continue xopenex, IS  RENAL- creatinine normal  Still about 10 pounds up from preop- start PO lasix and K  Dc Foley  ENDO- CBG well controlled- change to AC/HS  Deconditioning- ambulate 2/3 of unit this AM  Pain control- has become less of an issue  PRN Tylenol, fentanyl for breakthrough pain   LOS: 6 days    Melrose Nakayama 05/31/2017

## 2017-05-31 NOTE — Progress Notes (Signed)
      [address]       [place] [address]             [phone]      Has had a good day  Ambulated  BP 120/61   Pulse 74   Temp 98.3 F (36.8 C) (Oral)   Resp 18   Ht 5\' 1"  (1.549 m)   Wt 190 lb 11.2 oz (86.5 kg)   LMP  (LMP Unknown)   SpO2 100%   BMI 36.03 kg/m    Intake/Output Summary (Last 24 hours) at 05/31/17 1807 Last data filed at 05/31/17 1600  Gross per 24 hour  Intake              790 ml  Output             1700 ml  Net             -910 ml    Remo Lipps C. Roxan Hockey, MD [facility] [phone]

## 2017-06-01 ENCOUNTER — Inpatient Hospital Stay (HOSPITAL_COMMUNITY): Payer: 59

## 2017-06-01 LAB — CBC
HCT: 25.1 % — ABNORMAL LOW (ref 36.0–46.0)
Hemoglobin: 8.4 g/dL — ABNORMAL LOW (ref 12.0–15.0)
MCH: 30.2 pg (ref 26.0–34.0)
MCHC: 33.5 g/dL (ref 30.0–36.0)
MCV: 90.3 fL (ref 78.0–100.0)
Platelets: 214 10*3/uL (ref 150–400)
RBC: 2.78 MIL/uL — ABNORMAL LOW (ref 3.87–5.11)
RDW: 14.2 % (ref 11.5–15.5)
WBC: 8.3 10*3/uL (ref 4.0–10.5)

## 2017-06-01 LAB — GLUCOSE, CAPILLARY
Glucose-Capillary: 112 mg/dL — ABNORMAL HIGH (ref 65–99)
Glucose-Capillary: 112 mg/dL — ABNORMAL HIGH (ref 65–99)
Glucose-Capillary: 115 mg/dL — ABNORMAL HIGH (ref 65–99)
Glucose-Capillary: 122 mg/dL — ABNORMAL HIGH (ref 65–99)
Glucose-Capillary: 110 mg/dL — ABNORMAL HIGH (ref 65–99)

## 2017-06-01 LAB — BASIC METABOLIC PANEL
Anion gap: 9 (ref 5–15)
BUN: 10 mg/dL (ref 6–20)
CO2: 28 mmol/L (ref 22–32)
Calcium: 8.3 mg/dL — ABNORMAL LOW (ref 8.9–10.3)
Chloride: 97 mmol/L — ABNORMAL LOW (ref 101–111)
Creatinine, Ser: 0.76 mg/dL (ref 0.44–1.00)
GFR calc Af Amer: >60 mL/min (ref >60)
GFR calc non Af Amer: >60 mL/min (ref >60)
Glucose, Bld: 106 mg/dL — ABNORMAL HIGH (ref 65–99)
Potassium: 3.4 mmol/L — ABNORMAL LOW (ref 3.5–5.1)
Sodium: 134 mmol/L — ABNORMAL LOW (ref 135–145)

## 2017-06-01 MED ORDER — INSULIN ASPART 100 UNIT/ML ~~LOC~~ SOLN
0.0000 [IU] | Freq: Three times a day (TID) | SUBCUTANEOUS | Status: DC
Start: 2017-06-01 — End: 2017-06-02

## 2017-06-01 MED ORDER — MOVING RIGHT ALONG BOOK
Freq: Once | Status: AC
  Administered 2017-06-04: 09:00:00
  Filled 2017-06-01: qty 1

## 2017-06-01 MED ORDER — ACETAMINOPHEN 325 MG PO TABS
650.0000 mg | ORAL_TABLET | Freq: Four times a day (QID) | ORAL | Status: DC | PRN
  Administered 2017-06-01 – 2017-06-04 (×6): 650 mg via ORAL
  Filled 2017-06-01 (×6): qty 2

## 2017-06-01 MED ORDER — ONDANSETRON HCL 4 MG PO TABS: 4.0000 mg | ORAL_TABLET | Freq: Four times a day (QID) | ORAL | Status: DC | PRN

## 2017-06-01 MED ORDER — SODIUM CHLORIDE 0.9% FLUSH: 3.0000 mL | INTRAVENOUS | Status: DC | PRN

## 2017-06-01 MED ORDER — SODIUM CHLORIDE 0.9% FLUSH
3.0000 mL | Freq: Two times a day (BID) | INTRAVENOUS | Status: DC
  Administered 2017-06-03 – 2017-06-04 (×3): 3 mL via INTRAVENOUS

## 2017-06-01 MED ORDER — ONDANSETRON HCL 4 MG/2ML IJ SOLN
4.0000 mg | Freq: Four times a day (QID) | INTRAMUSCULAR | Status: DC | PRN
  Administered 2017-06-02: 4 mg via INTRAVENOUS
  Filled 2017-06-01: qty 2

## 2017-06-01 MED ORDER — SODIUM CHLORIDE 0.9 % IV SOLN: 250.0000 mL | INTRAVENOUS | Status: DC | PRN

## 2017-06-01 MED ORDER — BISACODYL 5 MG PO TBEC: 10.0000 mg | DELAYED_RELEASE_TABLET | Freq: Every day | ORAL | Status: DC | PRN

## 2017-06-01 MED ORDER — PANTOPRAZOLE SODIUM 40 MG PO TBEC
40.0000 mg | DELAYED_RELEASE_TABLET | Freq: Every day | ORAL | Status: DC
  Administered 2017-06-02 – 2017-06-05 (×4): 40 mg via ORAL
  Filled 2017-06-01 (×4): qty 1

## 2017-06-01 MED ORDER — BISACODYL 10 MG RE SUPP
10.0000 mg | Freq: Every day | RECTAL | Status: DC | PRN
Start: 2017-06-01 — End: 2017-06-02

## 2017-06-01 NOTE — Progress Notes (Signed)
Physical Therapy Treatment Patient Details Name: Bianca [name] MRN: [id number] DOB: [birth year] Today's Date: 06/01/2017    History of Present Illness Pt underwent CABG x 3..    PT Comments    Pt with improved transfer mobility however demo'd decreased ambulation tolerance requiring more rest breaks this date. Acute PT to con't to follow.   Follow Up Recommendations  SNF;Supervision/Assistance - 24 hour     Equipment Recommendations       Recommendations for Other Services       Precautions / Restrictions Precautions Precautions: Fall;Sternal Restrictions Weight Bearing Restrictions: Yes Other Position/Activity Restrictions: sternal precautions    Mobility  Bed Mobility Overal bed mobility: Needs Assistance Bed Mobility: Rolling;Sidelying to Sit Rolling: Min assist Sidelying to sit: Min assist;Mod assist       General bed mobility comments: modA for trunk elevation, pt with good command follow  Transfers Overall transfer level: Needs assistance   Transfers: Sit to/from Stand Sit to Stand: Mod assist         General transfer comment: modA to power up, pt held heart pillow  Ambulation/Gait Ambulation/Gait assistance: Min assist;+2 safety/equipment Ambulation Distance (Feet): 290 Feet   Gait Pattern/deviations: Step-through pattern;Decreased stride length;Trunk flexed Gait velocity: slow Gait velocity interpretation: Below normal speed for age/gender General Gait Details: pt with 5 standign rest breaks due to "i have to catch my breath" pts SpO2 >94% on 2Lo2 via [place]. pt with increased trunk flexion, minA for walker management   Stairs            Wheelchair Mobility    Modified Rankin (Stroke Patients Only)       Balance Overall balance assessment: Needs assistance Sitting-balance support: No upper extremity supported;Feet supported Sitting balance-Leahy Scale: Fair     Standing balance support: Bilateral upper extremity supported;During  functional activity Standing balance-Leahy Scale: Poor Standing balance comment: pt however was able to perform hygiene s/p BM, in standing                            Cognition Arousal/Alertness: Awake/alert Behavior During Therapy: WFL for tasks assessed/performed Overall Cognitive Status: Impaired/Different from baseline Area of Impairment: Problem solving;Safety/judgement                             Problem Solving: Slow processing;Decreased initiation;Difficulty sequencing;Requires tactile cues;Requires verbal cues General Comments: pt awake and alert but with tendency to maintain eyes closed      Exercises      General Comments General comments (skin integrity, edema, etc.): pt with loose stool and assisted to [facility]      Pertinent Vitals/Pain Pain Assessment: 0-10 Pain Score: 4  Pain Location: chest incision Pain Descriptors / Indicators: Aching;Grimacing;Guarding Pain Intervention(s): Monitored during session    Home Living                      Prior Function            PT Goals (current goals can now be found in the care plan section) Progress towards PT goals: Progressing toward goals    Frequency    Min 3X/week      PT Plan Current plan remains appropriate    Co-evaluation              AM-PAC PT "6 Clicks" Daily Activity  Outcome Measure  Difficulty turning over in bed (including adjusting bedclothes,  sheets and blankets)?: A Lot Difficulty moving from lying on back to sitting on the side of the bed? : A Lot Difficulty sitting down on and standing up from a chair with arms (e.g., wheelchair, bedside commode, etc,.)?: A Lot Help needed moving to and from a bed to chair (including a wheelchair)?: A Lot Help needed walking in hospital room?: A Lot Help needed climbing 3-5 steps with a railing? : A Lot 6 Click Score: 12    End of Session Equipment Utilized During Treatment: Gait belt;Oxygen Activity Tolerance:  Patient limited by fatigue Patient left: in chair;with call bell/phone within reach;with family/visitor present Nurse Communication: Mobility status PT Visit Diagnosis: Muscle weakness (generalized) (M62.81);Pain     Time: 8882-8003 PT Time Calculation (min) (ACUTE ONLY): 30 min  Charges:  $Gait Training: 8-22 mins $Therapeutic Activity: 8-22 mins                    G Codes:       Kittie Plater, PT, DPT Pager #: [phone] Office #: [phone]    [place] 06/01/2017, 2:59 PM

## 2017-06-01 NOTE — Progress Notes (Signed)
Report called to [facility] on 4E

## 2017-06-01 NOTE — Progress Notes (Signed)
Patient ID: Bianca [name], female   DOB: [birth year], 71 y.o.   MRN: [id number]  TCTS DAILY ICU PROGRESS NOTE                   [address]            [place] [address]          [phone]   6 Days Post-Op Procedure(s) (LRB): CORONARY ARTERY BYPASS GRAFTING (CABG), ON PUMP, TIMES Three, using left internal mammary artery and right greater saphenous vein harvested endoscopically (N/A) TRANSESOPHAGEAL ECHOCARDIOGRAM (TEE) (N/A)  Total Length of Stay:  LOS: 7 days   Subjective: Feels well  Objective: Vital signs in last 24 hours: Temp:  [97.6 F (36.4 C)-98.6 F (37 C)] 98.6 F (37 C) (10/15 0800) Pulse Rate:  [67-159] 78 (10/15 0931) Cardiac Rhythm: Normal sinus rhythm (10/15 0800) Resp:  [13-26] 21 (10/15 0900) BP: (80-132)/(51-75) 112/57 (10/15 0931) SpO2:  [94 %-100 %] 94 % (10/15 0900) FiO2 (%):  [28 %] 28 % (10/15 0743) Weight:  [187 lb 6.3 oz (85 kg)] 187 lb 6.3 oz (85 kg) (10/15 0700)  Filed Weights   05/30/17 0500 05/31/17 0500 06/01/17 0700  Weight: 193 lb 9.6 oz (87.8 kg) 190 lb 11.2 oz (86.5 kg) 187 lb 6.3 oz (85 kg)    Weight change: -3 lb 4.9 oz (-1.5 kg)   Hemodynamic parameters for last 24 hours:    Intake/Output from previous day: 10/14 0701 - 10/15 0700 In: 420 [P.O.:390; I.V.:30] Out: 1200 [Urine:1200]  Intake/Output this shift: Total I/O In: 50 [IV Piggyback:50] Out: -   Current Meds: Scheduled Meds: . amiodarone  400 mg Oral BID  . atorvastatin  80 mg Oral q1800  . baclofen  10 mg Oral QHS  . bisacodyl  10 mg Oral Daily  . buPROPion  150 mg Oral Daily  . Chlorhexidine Gluconate Cloth  6 each Topical Daily  . clopidogrel  75 mg Oral Daily  . docusate sodium  200 mg Oral Daily  . enoxaparin (LOVENOX) injection  30 mg Subcutaneous QHS  . escitalopram  20 mg Oral Daily  . furosemide  40 mg Oral Daily  . gabapentin  300 mg Oral QHS  . hydrOXYzine  25 mg Oral QHS  . insulin aspart  0-15 Units Subcutaneous TID WC  .  levalbuterol  0.63 mg Nebulization TID  . meclizine  25 mg Oral BID  . mouth rinse  15 mL Mouth Rinse BID  . metoprolol tartrate  12.5 mg Oral BID  . pantoprazole  40 mg Oral Daily  . pentosan polysulfate  200 mg Oral BID  . potassium chloride  20 mEq Oral BID  . sodium chloride flush  10-40 mL Intracatheter Q12H   Continuous Infusions: . phenylephrine (NEO-SYNEPHRINE) Adult infusion Stopped (05/29/17 1121)  . potassium chloride 10 mEq (06/01/17 0933)   PRN Meds:.acetaminophen, fentaNYL (SUBLIMAZE) injection, metoprolol tartrate, ondansetron (ZOFRAN) IV, potassium chloride, sodium chloride flush  General appearance: alert and cooperative Neurologic: intact Heart: regular rate and rhythm, S1, S2 normal, no murmur, click, rub or gallop Lungs: clear to auscultation bilaterally Abdomen: soft, non-tender; bowel sounds normal; no masses,  no organomegaly Extremities: extremities normal, atraumatic, no cyanosis or edema and Homans sign is negative, no sign of DVT Wound: intact  Lab Results: CBC: Recent Labs  05/31/17 0600 06/01/17 0500  WBC 8.6 8.3  HGB 8.4* 8.4*  HCT 25.1* 25.1*  PLT 172 214   BMET:  Recent  Labs  05/31/17 0600 06/01/17 0500  NA 132* 134*  K 3.8 3.4*  CL 98* 97*  CO2 27 28  GLUCOSE 108* 106*  BUN 12 10  CREATININE 0.81 0.76  CALCIUM 8.1* 8.3*    CMET: Lab Results  Component Value Date   WBC 8.3 06/01/2017   HGB 8.4 (L) 06/01/2017   HCT 25.1 (L) 06/01/2017   PLT 214 06/01/2017   GLUCOSE 106 (H) 06/01/2017   CHOL 233 (H) 10/31/2016   TRIG 316.0 (H) 10/31/2016   HDL 48.80 10/31/2016   LDLDIRECT 129.0 10/31/2016   LDLCALC 106 (H) 01/03/2014   ALT 25 01/02/2017   AST 30 01/02/2017   NA 134 (L) 06/01/2017   K 3.4 (L) 06/01/2017   CL 97 (L) 06/01/2017   CREATININE 0.76 06/01/2017   BUN 10 06/01/2017   CO2 28 06/01/2017   TSH 5.685 (H) 05/28/2017   INR 1.46 05/26/2017   HGBA1C 5.8 02/14/2015      PT/INR: No results for input(s): LABPROT,  INR in the last 72 hours. Radiology: Dg Chest Port 1 View  Result Date: 06/01/2017 CLINICAL DATA:  Shortness of breath.  Postop from CABG. EXAM: PORTABLE CHEST 1 VIEW COMPARISON:  05/31/2017 FINDINGS: Right arm PICC line remains in appropriate position. Bibasilar atelectasis or infiltrates show no significant change. Heart size is stable. No pneumothorax visualized. IMPRESSION: Bibasilar atelectasis or infiltrates, without significant change. Electronically Signed   By: Earle Gell M.D.   On: 06/01/2017 07:26     Assessment/Plan: S/P Procedure(s) (LRB): CORONARY ARTERY BYPASS GRAFTING (CABG), ON PUMP, TIMES Three, using left internal mammary artery and right greater saphenous vein harvested endoscopically (N/A) TRANSESOPHAGEAL ECHOCARDIOGRAM (TEE) (N/A) Mobilize Diuresis Plan for transfer to step-down: see transfer orders     Bianca [name] 06/01/2017 9:56 AM

## 2017-06-02 ENCOUNTER — Inpatient Hospital Stay (HOSPITAL_COMMUNITY): Payer: 59

## 2017-06-02 DIAGNOSIS — I480 Paroxysmal atrial fibrillation: Secondary | ICD-10-CM

## 2017-06-02 LAB — BASIC METABOLIC PANEL
Anion gap: 8 (ref 5–15)
BUN: 8 mg/dL (ref 6–20)
CO2: 29 mmol/L (ref 22–32)
Calcium: 8.5 mg/dL — ABNORMAL LOW (ref 8.9–10.3)
Chloride: 100 mmol/L — ABNORMAL LOW (ref 101–111)
Creatinine, Ser: 0.73 mg/dL (ref 0.44–1.00)
GFR calc Af Amer: >60 mL/min (ref >60)
GFR calc non Af Amer: >60 mL/min (ref >60)
Glucose, Bld: 107 mg/dL — ABNORMAL HIGH (ref 65–99)
Potassium: 3.3 mmol/L — ABNORMAL LOW (ref 3.5–5.1)
Sodium: 137 mmol/L (ref 135–145)

## 2017-06-02 LAB — GLUCOSE, CAPILLARY: Glucose-Capillary: 105 mg/dL — ABNORMAL HIGH (ref 65–99)

## 2017-06-02 LAB — CBC
HCT: 27 % — ABNORMAL LOW (ref 36.0–46.0)
Hemoglobin: 9 g/dL — ABNORMAL LOW (ref 12.0–15.0)
MCH: 30.1 pg (ref 26.0–34.0)
MCHC: 33.3 g/dL (ref 30.0–36.0)
MCV: 90.3 fL (ref 78.0–100.0)
Platelets: 276 10*3/uL (ref 150–400)
RBC: 2.99 MIL/uL — ABNORMAL LOW (ref 3.87–5.11)
RDW: 13.8 % (ref 11.5–15.5)
WBC: 7.6 10*3/uL (ref 4.0–10.5)

## 2017-06-02 LAB — MAGNESIUM: Magnesium: 1.9 mg/dL (ref 1.7–2.4)

## 2017-06-02 MED ORDER — AMIODARONE HCL IN DEXTROSE 360-4.14 MG/200ML-% IV SOLN
30.0000 mg/h | INTRAVENOUS | Status: DC
  Administered 2017-06-02: 30 mg/h via INTRAVENOUS
  Filled 2017-06-02: qty 200

## 2017-06-02 MED ORDER — AMIODARONE LOAD VIA INFUSION
150.0000 mg | Freq: Once | INTRAVENOUS | Status: AC
  Administered 2017-06-02: 150 mg via INTRAVENOUS
  Filled 2017-06-02: qty 83.34

## 2017-06-02 MED ORDER — AMIODARONE IV BOLUS ONLY 150 MG/100ML: 150.0000 mg | Freq: Once | INTRAVENOUS | Status: DC

## 2017-06-02 MED ORDER — AMIODARONE HCL IN DEXTROSE 360-4.14 MG/200ML-% IV SOLN
60.0000 mg/h | INTRAVENOUS | Status: AC
  Administered 2017-06-02 (×2): 60 mg/h via INTRAVENOUS
  Filled 2017-06-02 (×2): qty 200

## 2017-06-02 MED ORDER — METOPROLOL TARTRATE 25 MG PO TABS
25.0000 mg | ORAL_TABLET | Freq: Two times a day (BID) | ORAL | Status: DC
  Administered 2017-06-02 (×2): 25 mg via ORAL
  Filled 2017-06-02 (×2): qty 1

## 2017-06-02 MED ORDER — POTASSIUM CHLORIDE CRYS ER 20 MEQ PO TBCR
40.0000 meq | EXTENDED_RELEASE_TABLET | Freq: Once | ORAL | Status: AC
  Administered 2017-06-02: 40 meq via ORAL
  Filled 2017-06-02: qty 2

## 2017-06-02 MED ORDER — LEVALBUTEROL HCL 0.63 MG/3ML IN NEBU
0.6300 mg | INHALATION_SOLUTION | Freq: Two times a day (BID) | RESPIRATORY_TRACT | Status: DC
  Administered 2017-06-02 – 2017-06-04 (×4): 0.63 mg via RESPIRATORY_TRACT
  Filled 2017-06-02 (×5): qty 3

## 2017-06-02 MED FILL — Heparin Sodium (Porcine) Inj 1000 Unit/ML: INTRAMUSCULAR | Qty: 30 | Status: AC

## 2017-06-02 MED FILL — Potassium Chloride Inj 2 mEq/ML: INTRAVENOUS | Qty: 40 | Status: AC

## 2017-06-02 MED FILL — Magnesium Sulfate Inj 50%: INTRAMUSCULAR | Qty: 10 | Status: AC

## 2017-06-02 NOTE — Discharge Summary (Addendum)
Physician Discharge Summary  Patient ID: Bianca [name] MRN: [id number] DOB/AGE: 1945/12/04 71 y.o.  Admit date: 05/25/2017 Discharge date: 06/05/2017  Admission Diagnoses: 1. Chest pain 2. History of CAD (previous MIs, s/p PCI and stents)  Discharge Diagnoses:   Patient Active Problem List   Diagnosis Date Noted  . S/P CABG x 3 05/26/2017  . Chest pain in adult 05/25/2017  . RAD (reactive airway disease) 04/01/2016  . Dyspnea 02/20/2016  . Bradycardia 01/27/2016  . B12 deficiency 07/11/2015  . Anemia associated with acute blood loss 05/28/2014  . Cigarette smoker 05/28/2014  . OA (osteoarthritis) of hip 05/24/2014  . Chest pain 12/19/2013  . History of non-ST elevation myocardial infarction (NSTEMI) 05/18/2012  . Allergic rhinitis due to pollen 04/01/2012  . Back pain 11/22/2010  . NEOPLASM OF UNCERTAIN BEHAVIOR OF SKIN 06/05/2010  . Swallowing difficulty 06/04/2010  . GERD 07/30/2009  . Hyperlipidemia 03/02/2007  . Depression 03/02/2007  . Coronary artery disease involving native coronary artery of native heart with unstable angina pectoris ([place]) 03/02/2007   Discharged Condition: Stable and discharged to home.  History of Present Illness:  Bianca [name] is a 71 yo female with known history of CAD.  She has had multiple stents placed to her RCA, Acute Myocardial Infarction in 2013, 2017.  She has been on Plavix chronically.  She presented with complaints of chest pain described as burning for the past 3 weeks.  She states that it had been getting progressively worse.  She presented to the ED on 05/25/2017 after a prolonged episode.  EKG at that time showed evidence of ischemia, but her Troponin level was negative.  She was started on NTG drip for pain.  She continued to have chest pain and was ultimately taken for cardiac catheterization and was found have severe circumflex and LAD disease.  It was felt emergent coronary bypass grafting would be indicated and TCTS consult was  obtained.  She was evaluated by Dr. Servando Snare who was in agreement CABG would be indicated.  The risks and benefits of the procedure were explained to the patient and she was agreeable to proceed.  Hospital Course:   Bianca [name] was taken emergently the operating room.  She underwent CABG x 3 utilizing LIMA to LAD, SVG to OM1, and SVG to PDA.  She also underwent endoscopic harvest of the right leg.  She tolerated the procedure without difficulty and was taken to the SICU in stable condition.  During his stay in the SICU the patient was weaned and extubated POD #1.  Her chest tubes and arterial lines were removed without difficulty.  She had marginal urinary output and was started on Lasix.  Her urine appeared to be infected and UA and culture was obtained.  This showed no evidence of UTI and culture showed no growth.  She was weaned of Neo-synephrine drip as tolerated.  She developed rapid Atrial Fibrillation and was treated with IV amiodarone.  She converted to NSR and was transitioned to oral amiodarone.  However after several days use she developed nausea and this was discontinued.  She had post operative blood loss anemia and was transfused accordingly.  She required aggressive pulmonary toilet due to significant nicotine abuse.  She is severely deconditioned.  She participated with PT/OT and was a maximum assistance with ambulation.  She will require SNF placement at discharge.  She continued to maintain NSR and was felt stable for transfer to the telemetry unit on POD #6.  The patient again converted  back to Atrial Fibrillation.  She was started on beta blocker and Amiodarone drip.  She converted to NSR without issue.  She was transitioned to oral amiodarone again at a reduced dose. Her Lopressor and Amiodarone were decreased as she remained in sinus rhythm/sinus bradycardia. Epicardial pacing wires were removed on 06/03/2017.  Chest tube sutures were removed on 06/04/2017. Per Dr. Servando Snare, patient now has  family staying with her and she wants to go home. She will need home PT. She is felt surgically stable for discharge today.   Significant Diagnostic Studies: angiography:    Severe three-vessel coronary disease with acute coronary syndrome/unstable angina presentation. No acute EKG changes are noted despite ongoing pain.  Total occlusion within the extensively stented right coronary. Distal vessel fills by collaterals from left to right.  Widely patent left main.  95% stenosis proximal to mid LAD distal to the first diagonal.  80% proximal to mid circumflex and 90% proximal first obtuse marginal.  Inferobasal akinesis. EF 35%. Normal LVEDP  Treatments: surgery:   Coronary artery bypass grafting x3 with the left internal mammary to the left anterior descending coronary artery, reverse saphenous vein graft to the first obtuse marginal coronary artery, and reverse saphenous vein graft to the posterior descending coronary artery with right thigh greater saphenous endo vein harvesting by Dr. Servando Snare on 05/26/2017.  Discharge Medications: Allergies as of 06/05/2017      Reactions   Oxycodone Hcl Itching, Other (See Comments)   Confusion, hallucinations   Penicillins Itching, Rash   Has patient had a PCN reaction causing immediate rash, facial/tongue/throat swelling, SOB or lightheadedness with hypotension: yes rash that took a while to go away across the abdomen Has patient had a PCN reaction causing severe rash involving mucus membranes or skin necrosis: no Has patient had a PCN reaction that required hospitalization: no Has patient had a PCN reaction occurring within the last 10 years: No If all of the above answers are "NO", then may proceed with Cephalosporin use.   Macrobid [nitrofurantoin Monohyd Macro] Nausea And Vomiting   Sulfa Antibiotics Nausea Only   Tramadol Itching, Other (See Comments)   hallucinations   Aspirin Nausea And Vomiting, Other (See Comments)   Burning in  stomach      Medication List    STOP taking these medications   NITROSTAT 0.4 MG SL tablet Generic drug:  nitroGLYCERIN   simvastatin 80 MG tablet Commonly known as:  ZOCOR Replaced by:  atorvastatin 80 MG tablet     TAKE these medications   acetaminophen 325 MG tablet Commonly known as:  TYLENOL Take 2 tablets (650 mg total) by mouth every 6 (six) hours as needed for fever, headache, mild pain or moderate pain.   amiodarone 200 MG tablet Commonly known as:  PACERONE Take 1 tablet (200 mg total) by mouth daily.   ARTIFICIAL TEARS 1.4 % ophthalmic solution Generic drug:  polyvinyl alcohol Place 1 drop into both eyes 2 (two) times daily as needed for dry eyes.   atorvastatin 80 MG tablet Commonly known as:  LIPITOR Take 1 tablet (80 mg total) by mouth daily at 6 PM. Replaces:  simvastatin 80 MG tablet   baclofen 10 MG tablet Commonly known as:  LIORESAL Take 10 mg by mouth at bedtime.   buPROPion 150 MG 24 hr tablet Commonly known as:  WELLBUTRIN XL Take 150 mg by mouth daily.   clopidogrel 75 MG tablet Commonly known as:  PLAVIX Take 1 tablet (75 mg total) by mouth  daily.   cyanocobalamin 1000 MCG/ML injection Commonly known as:  (VITAMIN B-12) Inject 1,000 mcg into the muscle every 30 (thirty) days. Vitamin B12   - last injection 05/22/17   dexlansoprazole 60 MG capsule Commonly known as:  DEXILANT Take 1 capsule (60 mg total) by mouth daily. What changed:  when to take this  reasons to take this   escitalopram 20 MG tablet Commonly known as:  LEXAPRO Take 20 mg by mouth daily.   ezetimibe 10 MG tablet Commonly known as:  ZETIA Take 10 mg by mouth at bedtime.   famotidine 20 MG tablet Commonly known as:  PEPCID Take 1 tablet (20 mg total) by mouth at bedtime. 1-2 hours before bed What changed:  additional instructions   fluticasone 220 MCG/ACT inhaler Commonly known as:  FLOVENT HFA Inhale 1 puff into the lungs 2 (two) times daily.    fluticasone 50 MCG/ACT nasal spray Commonly known as:  FLONASE Place 2 sprays into both nostrils daily as needed for allergies or rhinitis.   furosemide 40 MG tablet Commonly known as:  LASIX Take 1 tablet (40 mg total) by mouth daily. For 5 days then stop.   gabapentin 300 MG capsule Commonly known as:  NEURONTIN Take 300 mg by mouth at bedtime.   HYDROcodone-acetaminophen 5-325 MG tablet Commonly known as:  NORCO/VICODIN Take 1 tablet by mouth every 6 (six) hours as needed for severe pain.   hydrOXYzine 25 MG tablet Commonly known as:  ATARAX/VISTARIL Take 25 mg by mouth at bedtime.   ME/NAPHOS/MB/HYO1 PO Take 1 tablet by mouth every 6 (six) hours as needed (vaginal burning).   meclizine 25 MG tablet Commonly known as:  ANTIVERT Take 1 tablet (25 mg total) by mouth 2 (two) times daily. What changed:  when to take this  reasons to take this   metoprolol tartrate 25 MG tablet Commonly known as:  LOPRESSOR Take 0.5 tablets (12.5 mg total) by mouth 2 (two) times daily. What changed:  medication strength  how much to take  how to take this  when to take this  additional instructions   multivitamin with minerals Tabs tablet Take 1 tablet by mouth daily.   pentosan polysulfate 100 MG capsule Commonly known as:  ELMIRON Take 200 mg by mouth 2 (two) times daily. For pain   potassium chloride SA 20 MEQ tablet Commonly known as:  K-DUR,KLOR-CON Take 1 tablet (20 mEq total) by mouth daily. For 5 days then stop.   PREMARIN vaginal cream Generic drug:  conjugated estrogens USE 1/2 GRAM VAGINALLY EVERY NIGHT AT BEDTIME FOR THE 1ST 2 WEEKS,, THEN 1/2 GRAM 2-3 TIMES A WEEK What changed:  See the new instructions.   PROBIOTIC PO Take 1 capsule by mouth at bedtime. [employer] brand   RED YEAST RICE PO Take 1 capsule by mouth at bedtime.   zolpidem 5 MG tablet Commonly known as:  AMBIEN Take 1 tablet (5 mg total) by mouth at bedtime.       The patient  has been discharged on:   1.Beta Blocker:  Yes [ x  ]                              No   [   ]                              If No, reason:  2.Ace Inhibitor/ARB: Yes [   ]                                     No  [  x  ]                                     If No, reason: labile BP  3.Statin:   Yes [x   ]                  No  [   ]                  If No, reason:  4.Ecasa:  Yes  [  ]                  No   [ x  ]                  If No, reason:Allergy   Follow-up Information    [name], MD Follow up on 07/02/2017.   Specialty:  Cardiothoracic Surgery Why:  Appointment is at 4:00, please get CXR at 3:30 at [facility] located on first floor of our office building Contact information: [place] [address] [phone]        Richardson Dopp T, PA-C Follow up on 06/17/2017.   Specialties:  Cardiology, Physician Assistant Why:  Appointment is at 11:15 Contact information: [address] [place] [address] [phone]           Signed: Nani Skillern PA-C 06/05/2017, 8:50 AM

## 2017-06-02 NOTE — Progress Notes (Addendum)
      [address]       [place] [address]             [phone]        7 Days Post-Op Procedure(s) (LRB): CORONARY ARTERY BYPASS GRAFTING (CABG), ON PUMP, TIMES Three, using left internal mammary artery and right greater saphenous vein harvested endoscopically (N/A) TRANSESOPHAGEAL ECHOCARDIOGRAM (TEE) (N/A)  Subjective: Patient without complaints this am. She does feel her heart racing at times.  Objective: Vital signs in last 24 hours: Temp:  [98 F (36.7 C)-98.6 F (37 C)] 98.1 F (36.7 C) (10/16 0416) Pulse Rate:  [67-116] 116 (10/16 0416) Cardiac Rhythm: (P) Normal sinus rhythm (10/15 2000) Resp:  [15-31] 19 (10/16 0416) BP: (109-130)/(57-71) 130/68 (10/16 0416) SpO2:  [91 %-100 %] 97 % (10/16 0416) FiO2 (%):  [28 %] 28 % (10/15 0743) Weight:  [182 lb 14.4 oz (83 kg)] 182 lb 14.4 oz (83 kg) (10/16 0416)  Pre op weight 83 kg Current Weight  06/02/17 182 lb 14.4 oz (83 kg)      Intake/Output from previous day: 10/15 0701 - 10/16 0700 In: 470 [P.O.:240; I.V.:30; IV Piggyback:200] Out: 400 [Urine:100; Stool:300]   Physical Exam:  Cardiovascular: IRRR IRRR Pulmonary: Clear to auscultation bilaterally Abdomen: Soft, non tender, bowel sounds present. Extremities: Trace  bilateral lower extremity edema. Wounds: Clean and dry.  No erythema or signs of infection.  Lab Results: CBC: Recent Labs  06/01/17 0500 06/02/17 0452  WBC 8.3 7.6  HGB 8.4* 9.0*  HCT 25.1* 27.0*  PLT 214 276   BMET:  Recent Labs  06/01/17 0500 06/02/17 0452  NA 134* 137  K 3.4* 3.3*  CL 97* 100*  CO2 28 29  GLUCOSE 106* 107*  BUN 10 8  CREATININE 0.76 0.73  CALCIUM 8.3* 8.5*    PT/INR:  Lab Results  Component Value Date   INR 1.46 05/26/2017   INR 0.96 01/02/2017   INR 1.07 05/19/2014   ABG:  INR: Will add last result for INR, ABG once components are confirmed Will add last 4 CBG results once components are confirmed  Assessment/Plan:  1. CV  - A fib with HR as high as 120's. Not on BB so will start as well as Amiodarone drip. 2.  Pulmonary - On 2 liters of oxygen via [place]. Wean to room air as able. CXR this am shows bibasilar atelectasis, no pneumothorax.Encourage incentive spirometer. 3. Volume Overload - On Lasix 40 mg daily 4.  Acute blood loss anemia - H and H stable at 9 and 27 this am 5. Supplement potassium 6. CBGs 122/110/105. No prior history of DM and HGA1C drawn as emergent CABG. Will stop accu checks and SS 7. Remove EPW in am 8. Will need SNF when ready for discharge  ZIMMERMAN,DONIELLE MPA-C 06/02/2017,7:17 AM  Back in sinus this morning, convert to po Cordarone in am snf when one available  Next 48 hours if stays in sinus  I have seen and examined Bianca [name] and agree with the above assessment  and plan.  [name] MD Beeper [phone] Office [phone] 06/02/2017 5:17 PM

## 2017-06-02 NOTE — Progress Notes (Signed)
Patient converted back to Sinus Rhythm at 850 am. Md notified. No New orders given.  Makailah Slavick, Mervin Kung RN

## 2017-06-02 NOTE — Progress Notes (Addendum)
Notified PA that Patient is orthostatic especially when moving from sitting to standing. Vitals placed in the chart. Amiodarone is still running. No new orders given.   Cypress Hinkson, Mervin Kung RN

## 2017-06-02 NOTE — Progress Notes (Signed)
DAILY PROGRESS NOTE   Patient Name: Bianca [name] Date of Encounter: 06/02/2017  Hospital Problem List   Principal Problem:   Chest pain in adult Active Problems:   Hyperlipidemia   Coronary artery disease involving native coronary artery of native heart with unstable angina pectoris (HCC)   Cigarette smoker   S/P CABG x 3    Chief Complaint   Palpitations at times  Subjective   71 yo female s/p CABGx3 (POD #7)- noted to be in a-fib with RVR up to the 120's. Plan to start BB and amiodarone this am. On lasix for volume overload. Plan for d/c to SNF rehab. External pacing wires are in. She was noted to convert back to NSR at 8:52 this am - after starting amiodarone load.  Objective   Vitals:   06/01/17 2324 06/02/17 0416 06/02/17 0755 06/02/17 0800  BP: 125/67 130/68  120/82  Pulse:  (!) 116  (!) 125  Resp: _0 Temp: 98 F (36.7 C) 98.1 F (36.7 C)  98 F (36.7 C)  TempSrc: Oral Oral  Oral  SpO2: 98% 97% 94% 99%  Weight:  182 lb 14.4 oz (83 kg)    Height:        Intake/Output Summary (Last 24 hours) at 06/02/17 0851 Last data filed at 06/02/17 0505  Gross per 24 hour  Intake              470 ml  Output              400 ml  Net               70 ml   Filed Weights   05/31/17 0500 06/01/17 0700 06/02/17 0416  Weight: 190 lb 11.2 oz (86.5 kg) 187 lb 6.3 oz (85 kg) 182 lb 14.4 oz (83 kg)    Physical Exam   General appearance: alert and no distress Lungs: clear to auscultation bilaterally Heart: regular rate and rhythm Extremities: extremities normal, atraumatic, no cyanosis or edema and EPW wires in place Neurologic: Grossly normal  Inpatient Medications    Scheduled Meds: . atorvastatin  80 mg Oral q1800  . baclofen  10 mg Oral QHS  . buPROPion  150 mg Oral Daily  . Chlorhexidine Gluconate Cloth  6 each Topical Daily  . clopidogrel  75 mg Oral Daily  . enoxaparin (LOVENOX) injection  30 mg Subcutaneous QHS  . escitalopram  20 mg Oral Daily    . furosemide  40 mg Oral Daily  . gabapentin  300 mg Oral QHS  . hydrOXYzine  25 mg Oral QHS  . levalbuterol  0.63 mg Nebulization TID  . meclizine  25 mg Oral BID  . mouth rinse  15 mL Mouth Rinse BID  . metoprolol tartrate  25 mg Oral BID  . moving right along book   Does not apply Once  . pantoprazole  40 mg Oral QAC breakfast  . pentosan polysulfate  200 mg Oral BID  . potassium chloride  20 mEq Oral BID  . potassium chloride  40 mEq Oral Once  . sodium chloride flush  10-40 mL Intracatheter Q12H  . sodium chloride flush  3 mL Intravenous Q12H    Continuous Infusions: . sodium chloride    . amiodarone 60 mg/hr (06/02/17 0824)   Followed by  . amiodarone      PRN Meds: sodium chloride, acetaminophen, ondansetron **OR** ondansetron (ZOFRAN) IV, sodium chloride flush, sodium chloride flush  Labs   Results for orders placed or performed during the hospital encounter of 05/25/17 (from the past 48 hour(s))  Glucose, capillary     Status: Abnormal   Collection Time: 05/31/17 11:55 AM  Result Value Ref Range   Glucose-Capillary 110 (H) 65 - 99 mg/dL   Comment 1 Notify RN   Glucose, capillary     Status: Abnormal   Collection Time: 05/31/17  4:55 PM  Result Value Ref Range   Glucose-Capillary 111 (H) 65 - 99 mg/dL   Comment 1 Notify RN   Basic metabolic panel     Status: Abnormal   Collection Time: 06/01/17  5:00 AM  Result Value Ref Range   Sodium 134 (L) 135 - 145 mmol/L   Potassium 3.4 (L) 3.5 - 5.1 mmol/L   Chloride 97 (L) 101 - 111 mmol/L   CO2 28 22 - 32 mmol/L   Glucose, Bld 106 (H) 65 - 99 mg/dL   BUN 10 6 - 20 mg/dL   Creatinine, Ser 0.76 0.44 - 1.00 mg/dL   Calcium 8.3 (L) 8.9 - 10.3 mg/dL   GFR calc non Af Amer >60 >60 mL/min   GFR calc Af Amer >60 >60 mL/min    Comment: (NOTE) The eGFR has been calculated using the CKD EPI equation. This calculation has not been validated in all clinical situations. eGFR's persistently <60 mL/min signify possible  Chronic Kidney Disease.    Anion gap 9 5 - 15  CBC     Status: Abnormal   Collection Time: 06/01/17  5:00 AM  Result Value Ref Range   WBC 8.3 4.0 - 10.5 K/uL   RBC 2.78 (L) 3.87 - 5.11 MIL/uL   Hemoglobin 8.4 (L) 12.0 - 15.0 g/dL   HCT 25.1 (L) 36.0 - 46.0 %   MCV 90.3 78.0 - 100.0 fL   MCH 30.2 26.0 - 34.0 pg   MCHC 33.5 30.0 - 36.0 g/dL   RDW 14.2 11.5 - 15.5 %   Platelets 214 150 - 400 K/uL  Glucose, capillary     Status: Abnormal   Collection Time: 06/01/17  8:23 AM  Result Value Ref Range   Glucose-Capillary 112 (H) 65 - 99 mg/dL   Comment 1 Capillary Specimen   Glucose, capillary     Status: Abnormal   Collection Time: 06/01/17 12:53 PM  Result Value Ref Range   Glucose-Capillary 122 (H) 65 - 99 mg/dL   Comment 1 Notify RN   Glucose, capillary     Status: Abnormal   Collection Time: 06/01/17  4:33 PM  Result Value Ref Range   Glucose-Capillary 110 (H) 65 - 99 mg/dL   Comment 1 Notify RN   CBC     Status: Abnormal   Collection Time: 06/02/17  4:52 AM  Result Value Ref Range   WBC 7.6 4.0 - 10.5 K/uL   RBC 2.99 (L) 3.87 - 5.11 MIL/uL   Hemoglobin 9.0 (L) 12.0 - 15.0 g/dL   HCT 27.0 (L) 36.0 - 46.0 %   MCV 90.3 78.0 - 100.0 fL   MCH 30.1 26.0 - 34.0 pg   MCHC 33.3 30.0 - 36.0 g/dL   RDW 13.8 11.5 - 15.5 %   Platelets 276 150 - 400 K/uL  Basic metabolic panel     Status: Abnormal   Collection Time: 06/02/17  4:52 AM  Result Value Ref Range   Sodium 137 135 - 145 mmol/L   Potassium 3.3 (L) 3.5 - 5.1 mmol/L   Chloride  100 (L) 101 - 111 mmol/L   CO2 29 22 - 32 mmol/L   Glucose, Bld 107 (H) 65 - 99 mg/dL   BUN 8 6 - 20 mg/dL   Creatinine, Ser 0.73 0.44 - 1.00 mg/dL   Calcium 8.5 (L) 8.9 - 10.3 mg/dL   GFR calc non Af Amer >60 >60 mL/min   GFR calc Af Amer >60 >60 mL/min    Comment: (NOTE) The eGFR has been calculated using the CKD EPI equation. This calculation has not been validated in all clinical situations. eGFR's persistently <60 mL/min signify possible  Chronic Kidney Disease.    Anion gap 8 5 - 15  Glucose, capillary     Status: Abnormal   Collection Time: 06/02/17  6:16 AM  Result Value Ref Range   Glucose-Capillary 105 (H) 65 - 99 mg/dL    ECG   N/A  Telemetry   A-fib overnight - now in NSR in the 70's - Personally Reviewed  Radiology    Dg Chest 2 View  Result Date: 06/02/2017 CLINICAL DATA:  71 year old female post CABG 5 days ago. Some chest discomfort and shortness breath this morning. Subsequent encounter. EXAM: CHEST  2 VIEW COMPARISON:  06/01/2017 chest x-ray. FINDINGS: Post CABG.  Epicardial leads remain in place. Right central line tip distal superior vena cava. Left base atelectasis with small left-sided pleural effusion. No pneumothorax. Mild central pulmonary vascular prominence appears stable. Limited evaluation of mediastinal structures with similar appearance to prior exam. IMPRESSION: Post CABG Left base atelectasis with small left-sided pleural effusion. Mild central pulmonary vascular prominence, unchanged. Electronically Signed   By: Genia Del M.D.   On: 06/02/2017 07:43   Dg Chest Port 1 View  Result Date: 06/01/2017 CLINICAL DATA:  Shortness of breath.  Postop from CABG. EXAM: PORTABLE CHEST 1 VIEW COMPARISON:  05/31/2017 FINDINGS: Right arm PICC line remains in appropriate position. Bibasilar atelectasis or infiltrates show no significant change. Heart size is stable. No pneumothorax visualized. IMPRESSION: Bibasilar atelectasis or infiltrates, without significant change. Electronically Signed   By: Earle Gell M.D.   On: 06/01/2017 07:26    Cardiac Studies   N/A  Assessment   1. Principal Problem: 2.   Chest pain in adult 3. Active Problems: 4.   Hyperlipidemia 5.   Coronary artery disease involving native coronary artery of native heart with unstable angina pectoris ([place]) 6.   Cigarette smoker 7.   S/P CABG x 3 8.   Plan   1. PAF overnight- now back in NSR on amiodarone. Agree with this  approach as well as b-blocker. Would finish amiodarone load then switch to oral amiodarone 400 mg daily. Given her brief a-fib of <24 hrs duration, would not anticoagulate at this time beyond Plavix. If a-fib recurs, then based on CHADSVASC score of 5 (age, female, CHF, HTN), would add Eliquis 5 mg BID to plavix. Consider 30 day monitor after discharge while at SNF to look for recurrent a-fib. She is noted to have diarrhea today - may need to consider stool studies, d/c stool softeners.  Time Spent Directly with Patient:  I have spent a total of 15 minutes with the patient reviewing hospital notes, telemetry, EKGs, labs and examining the patient as well as establishing an assessment and plan that was discussed personally with the patient. > 50% of time was spent in direct patient care.  Length of Stay:  LOS: 8 days   Pixie Casino, MD, [place]  Attending Cardiologist  Direct Dial: [phone]  Fax: [phone]  Website:  [email or web address] 06/02/2017, 8:51 AM

## 2017-06-02 NOTE — Progress Notes (Signed)
CARDIAC REHAB PHASE I   PRE:  Rate/Rhythm: 69 SB  BP:  Sitting: 100/63        SaO2: 93 RA  MODE:  Ambulation: to door  POST:  Rate/Rhythm: 60 SR  BP:  Sitting: 90/52, 104/59 after sitting, 79/51 after standing again, 94/53 after sitting         SaO2: 98 RA  Pt c/o generalized weakness, some pain in her sternum (requesting stronger pain medication), agreeable to walk. Pt ambulated to door on RA, RW, gait belt, IV, assist x2, slow, mildly unsteady gait at times, c/o weakness, states "I feel like I am going to fall," assisted to sit in recliner. Pt orthostatic x2 (rechecked to confirm), symptomatic. Pt c/o feeling "weak," some nausea, difficulty verbalizing any other specific complaints. RN notified. Sats 98% on RA, NSR. Pt to recliner after walk, feet elevated, call bell within reach. Will follow as x2.   [id number] Lenna Sciara, RN, BSN 06/02/2017 11:43 AM

## 2017-06-03 ENCOUNTER — Encounter: Payer: Self-pay | Admitting: Physician Assistant

## 2017-06-03 LAB — GLUCOSE, CAPILLARY: Glucose-Capillary: 107 mg/dL — ABNORMAL HIGH (ref 65–99)

## 2017-06-03 MED ORDER — AMIODARONE HCL 200 MG PO TABS
200.0000 mg | ORAL_TABLET | Freq: Two times a day (BID) | ORAL | Status: DC
Start: 2017-06-03 — End: 2017-06-05
  Administered 2017-06-03 – 2017-06-04 (×4): 200 mg via ORAL
  Filled 2017-06-03 (×4): qty 1

## 2017-06-03 MED ORDER — LEVALBUTEROL HCL 0.63 MG/3ML IN NEBU
0.6300 mg | INHALATION_SOLUTION | Freq: Four times a day (QID) | RESPIRATORY_TRACT | Status: DC | PRN
  Administered 2017-06-03: 0.63 mg via RESPIRATORY_TRACT
  Filled 2017-06-03: qty 3

## 2017-06-03 MED ORDER — METOPROLOL TARTRATE 12.5 MG HALF TABLET
12.5000 mg | ORAL_TABLET | Freq: Two times a day (BID) | ORAL | Status: DC
  Administered 2017-06-03 – 2017-06-05 (×5): 12.5 mg via ORAL
  Filled 2017-06-03 (×5): qty 1

## 2017-06-03 NOTE — Progress Notes (Signed)
Maintaining sinus rhythm this am. Agree with plan to switch to amiodarone 400 mg daily. Given the brief episode of a-fib would not recommend anticoagulation unless it recurs. Would consider 30 day monitoring after discharge to evaluate for recurrence.  Pixie Casino, MD, [place]  Attending Cardiologist  Direct Dial: [phone]  Fax: [phone]  Website:  [email or web address]

## 2017-06-03 NOTE — Progress Notes (Addendum)
Clinical Social Worker met with patient and daughter Theadora Rama at bedside. CSW went over disposition plan with family. Patient stated she would like to pursue SNF because she does not have 24/hr care in the home. Patient stated she has support from husband and adult children but stated they work during the day. CSW gave family a list of SNF that made a bed offer and asked family to pick top facility. CSW also explained to family that insurance authorization will need to be attained before going to a SNF. CSW encouraged family that if [facility] denies authorization claim family would have to come up with alternative discharge plans. Daughter stated "I will make sure mama is taken care of". CSW to follow up with family once bed decision has been made.   5:20pm:  CSW met with patients spouse at bedside. Mr.Viana stated he does not like the bed offers given. Mr. Pfund stated he would not send his dog to some of these facility and does not feel comfortable with patent discharging to facility. CSW went over [place] and Private Duty. CSW gave patient and family a list of [facility] and private duty in the area. Mr. Elling stated he will look at list and make decision on patients discharge. Please contact spouse for decision [phone]).  If unable to reach spouse please contact daughter  Rhea Pink, MSW,  [place]

## 2017-06-03 NOTE — Progress Notes (Addendum)
      [address]       [place] [address]             [phone]        8 Days Post-Op Procedure(s) (LRB): CORONARY ARTERY BYPASS GRAFTING (CABG), ON PUMP, TIMES Three, using left internal mammary artery and right greater saphenous vein harvested endoscopically (N/A) TRANSESOPHAGEAL ECHOCARDIOGRAM (TEE) (N/A)  Subjective: Patient denies dizziness or further loose stools.  Objective: Vital signs in last 24 hours: Temp:  [98 F (36.7 C)-98.5 F (36.9 C)] 98.3 F (36.8 C) (10/17 0550) Pulse Rate:  [57-125] 57 (10/17 0550) Cardiac Rhythm: Normal sinus rhythm (10/17 0550) Resp:  [11-24] 13 (10/17 0550) BP: (83-120)/(53-97) 120/65 (10/17 0550) SpO2:  [92 %-99 %] 94 % (10/17 0550) Weight:  [185 lb 1.6 oz (84 kg)] 185 lb 1.6 oz (84 kg) (10/17 0554)  Pre op weight 83 kg Current Weight  06/03/17 185 lb 1.6 oz (84 kg)      Intake/Output from previous day: 10/16 0701 - 10/17 0700 In: 1040.4 [P.O.:840; I.V.:200.4] Out: 200 [Urine:200]   Physical Exam:  Cardiovascular: RRR Pulmonary: Clear to auscultation bilaterally Abdomen: Soft, non tender, bowel sounds present. Extremities: Trace bilateral lower extremity edema. Wounds: Clean and dry.  No erythema or signs of infection.  Lab Results: CBC:  Recent Labs  06/01/17 0500 06/02/17 0452  WBC 8.3 7.6  HGB 8.4* 9.0*  HCT 25.1* 27.0*  PLT 214 276   BMET:   Recent Labs  06/01/17 0500 06/02/17 0452  NA 134* 137  K 3.4* 3.3*  CL 97* 100*  CO2 28 29  GLUCOSE 106* 107*  BUN 10 8  CREATININE 0.76 0.73  CALCIUM 8.3* 8.5*    PT/INR:  Lab Results  Component Value Date   INR 1.46 05/26/2017   INR 0.96 01/02/2017   INR 1.07 05/19/2014   ABG:  INR: Will add last result for INR, ABG once components are confirmed Will add last 4 CBG results once components are confirmed  Assessment/Plan:  1. CV - Previous A fib with HR as high as 120's. Converted to sinus rhythm and remains in sinus  rhythm/SB in the 50's this am. On Lopressor 25 mg bid and Amiodarone drip. Will change Amiodarone to oral and decrease Lopressor. 2.  Pulmonary - On room air.Encourage incentive spirometer. 3. Volume Overload - On Lasix 40 mg daily 4.  Acute blood loss anemia - Last H and H stable at 9 and 27  5. Remove EPW  6. Will need SNF when ready for discharge-possibly in am if remains in sinus rhythm  ZIMMERMAN,DONIELLE MPA-C 06/03/2017,7:17 AM  Holding sinus on po agents SNF Thursday or Friday when can be arranged  I have seen and examined Bianca [name] and agree with the above assessment  and plan.  [name] MD [facility] [phone] Office [phone] 06/03/2017 12:47 PM

## 2017-06-03 NOTE — Progress Notes (Signed)
Physical Therapy Treatment Patient Details Name: Bianca [name] MRN: [id number] DOB: October 11, 1945 Today's Date: 06/03/2017    History of Present Illness Pt underwent CABG x 3..    PT Comments    Patient is making gradual progress toward mobility goals. Pt does require assistance for bed mobility and safe OOB mobility. Pt needs mod cues throughout to maintain sternal precautions. Pt demonstrated decreased activity tolerance and required several rest breaks while ambulating due to DOE. Continue to progress as tolerated with anticipated d/c to SNF for further skilled PT services.     Follow Up Recommendations  SNF;Supervision/Assistance - 24 hour     Equipment Recommendations  Other (comment) (TBA)    Recommendations for Other Services       Precautions / Restrictions Precautions Precautions: Fall;Sternal Precaution Comments: sternal precautions reviewed with pt Restrictions Weight Bearing Restrictions: Yes Other Position/Activity Restrictions: sternal precautions    Mobility  Bed Mobility Overal bed mobility: Needs Assistance Bed Mobility: Rolling;Sidelying to Sit;Sit to Sidelying Rolling: Min guard Sidelying to sit: Min assist     Sit to sidelying: Mod assist General bed mobility comments: assist to elevate trunk into sitting and to bring bilat LE into bed; cues for sequencing and technique with pt hugging cardiac pillow  Transfers Overall transfer level: Needs assistance Equipment used: Rolling walker (2 wheeled) Transfers: Sit to/from Stand Sit to Stand: Min assist         General transfer comment: assist to power up and to steady; cues for technique; pt used momentum   Ambulation/Gait Ambulation/Gait assistance: Min assist;+2 safety/equipment (chair follow) Ambulation Distance (Feet): 340 Feet Assistive device: Rolling walker (2 wheeled) Gait Pattern/deviations: Step-through pattern;Decreased stride length;Trunk flexed Gait velocity: decreased   General Gait  Details: pt required 4 standing rest breaks due to DOE and chest pain; pt required cues for posture, breathing technique, and safe use of AD as pt tended to want to lean on RW when feeling fatigued   Stairs            Wheelchair Mobility    Modified Rankin (Stroke Patients Only)       Balance Overall balance assessment: Needs assistance Sitting-balance support: No upper extremity supported;Feet supported Sitting balance-Leahy Scale: Fair     Standing balance support: Bilateral upper extremity supported;During functional activity Standing balance-Leahy Scale: Poor                              Cognition Arousal/Alertness: Awake/alert Behavior During Therapy: WFL for tasks assessed/performed Overall Cognitive Status: Within Functional Limits for tasks assessed                                        Exercises      General Comments        Pertinent Vitals/Pain Pain Assessment: Faces Faces Pain Scale: Hurts even more Pain Location: chest incision with mobility Pain Descriptors / Indicators: Grimacing;Guarding;Sore;Tightness Pain Intervention(s): Limited activity within patient's tolerance;Monitored during session;Repositioned    Home Living                      Prior Function            PT Goals (current goals can now be found in the care plan section) Acute Rehab PT Goals PT Goal Formulation: With patient Time For Goal Achievement: 06/11/17 Potential to Achieve  Goals: Good Progress towards PT goals: Progressing toward goals    Frequency    Min 3X/week      PT Plan Current plan remains appropriate    Co-evaluation              AM-PAC PT "6 Clicks" Daily Activity  Outcome Measure  Difficulty turning over in bed (including adjusting bedclothes, sheets and blankets)?: A Lot Difficulty moving from lying on back to sitting on the side of the bed? : Unable Difficulty sitting down on and standing up from a  chair with arms (e.g., wheelchair, bedside commode, etc,.)?: Unable Help needed moving to and from a bed to chair (including a wheelchair)?: A Little Help needed walking in hospital room?: A Little Help needed climbing 3-5 steps with a railing? : A Lot 6 Click Score: 12    End of Session Equipment Utilized During Treatment: Gait belt Activity Tolerance: Patient tolerated treatment well Patient left: with call bell/phone within reach;in bed Nurse Communication: Mobility status PT Visit Diagnosis: Muscle weakness (generalized) (M62.81);Pain Pain - part of body:  (chest)     Time: 1324-4010 PT Time Calculation (min) (ACUTE ONLY): 25 min  Charges:  $Gait Training: 8-22 mins $Therapeutic Activity: 8-22 mins                    G Codes:       Earney Navy, PTA Pager: [phone]     Darliss Cheney 06/03/2017, 4:33 PM

## 2017-06-03 NOTE — Progress Notes (Signed)
CARDIAC REHAB PHASE I   PRE:  Rate/Rhythm: 56 SB    BP: lying 98/50, sitting 120/70, standing 100/70    SaO2: 95 RA  MODE:  Ambulation: 260 ft   POST:  Rate/Rhythm: 67 SR    BP: sitting 126/66     SaO2: 94 RA  Pt asleep on arrival but awoke easily and ready to walk. Took orthostatics, slight change in BP however pt felt well. Sts she is not weak like she was yesterday. She was able to walk with RW, assist x2. Steady. Needed to rest x3 for dyspnea and fatigue. Declined needing to sit. To BSC then recliner. VSS, set up breakfast. Encouraged x2 more walks today and IS/flutter valve. Will f/u as x1.  Hernandez, ACSM 06/03/2017 9:27 AM

## 2017-06-04 MED ORDER — DIPHENHYDRAMINE HCL 25 MG PO CAPS
25.0000 mg | ORAL_CAPSULE | Freq: Every evening | ORAL | Status: DC | PRN
  Administered 2017-06-04: 25 mg via ORAL
  Filled 2017-06-04: qty 1

## 2017-06-04 MED ORDER — POTASSIUM CHLORIDE CRYS ER 20 MEQ PO TBCR
20.0000 meq | EXTENDED_RELEASE_TABLET | Freq: Every day | ORAL | Status: DC
  Administered 2017-06-04 – 2017-06-05 (×2): 20 meq via ORAL
  Filled 2017-06-04 (×2): qty 1

## 2017-06-04 MED ORDER — ACETAMINOPHEN 325 MG PO TABS: 650.0000 mg | ORAL_TABLET | Freq: Four times a day (QID) | ORAL | Status: DC | PRN

## 2017-06-04 MED ORDER — ATORVASTATIN CALCIUM 80 MG PO TABS: 80.0000 mg | ORAL_TABLET | Freq: Every day | ORAL | 1 refills | Status: DC

## 2017-06-04 MED ORDER — AMIODARONE HCL 200 MG PO TABS: 200.0000 mg | ORAL_TABLET | Freq: Two times a day (BID) | ORAL | 1 refills | Status: DC

## 2017-06-04 MED ORDER — HYDROCODONE-ACETAMINOPHEN 5-325 MG PO TABS: 1.0000 | ORAL_TABLET | Freq: Four times a day (QID) | ORAL | 0 refills | Status: DC | PRN

## 2017-06-04 MED ORDER — METOPROLOL TARTRATE 25 MG PO TABS: 12.5000 mg | ORAL_TABLET | Freq: Two times a day (BID) | ORAL | 1 refills | Status: DC

## 2017-06-04 MED ORDER — FUROSEMIDE 40 MG PO TABS: 40.0000 mg | ORAL_TABLET | Freq: Every day | ORAL | 0 refills | Status: DC

## 2017-06-04 MED ORDER — HYDROCODONE-ACETAMINOPHEN 5-325 MG PO TABS
1.0000 | ORAL_TABLET | Freq: Four times a day (QID) | ORAL | Status: DC | PRN
  Administered 2017-06-04 (×3): 1 via ORAL
  Filled 2017-06-04 (×3): qty 1

## 2017-06-04 MED ORDER — POTASSIUM CHLORIDE CRYS ER 20 MEQ PO TBCR: 20.0000 meq | EXTENDED_RELEASE_TABLET | Freq: Every day | ORAL | 0 refills | Status: DC

## 2017-06-04 NOTE — Progress Notes (Signed)
Pts pacer wires were removed at 10:39, sites were clean, band aid applied to the area. Pt remains in SR. Pt verbalized understanding of the bedrest restrictions. Chest tube sutures were also removed, no drainage noted, area cleaned with betadine and 1/2 inch steri strips were applied. Pt tolerated well, will ctm with q15 minute vitals.

## 2017-06-04 NOTE — Discharge Instructions (Signed)
1. Please wash incisions daily with soap and water.You may shower, but do not let water beat on your chest incision 2. No Driving for 4 weeks and as long as you are taking Narcotic pain medications 3. Diet- Resume Heart Healthy Diet 4. Activity- need to be up and ambulating at least 3x per day 5. Incisions- please call office if the develop drainage of pus or become red 6. Sternal Precautions- no lifting, pushing, pulling with upper arms over 8 lbs for at least 6 weeks     Coronary Artery Bypass Grafting, Care After This sheet gives you information about how to care for yourself after your procedure. Your health care provider may also give you more specific instructions. If you have problems or questions, contact your health care provider. What can I expect after the procedure? After the procedure, it is common to have:  Nausea and a lack of appetite.  Constipation.  Weakness and fatigue.  Depression or irritability.  Pain or discomfort in your incision areas.  Follow these instructions at home: Medicines  Take over-the-counter and prescription medicines only as told by your health care provider. Do not stop taking medicines or start any new medicines without approval from your health care provider.  If you were prescribed an antibiotic medicine, take it as told by your health care provider. Do not stop taking the antibiotic even if you start to feel better.  Do not drive or use heavy machinery while taking prescription pain medicine. Incision care  Follow instructions from your health care provider about how to take care of your incisions. Make sure you: ? Wash your hands with soap and water before you change your bandage (dressing). If soap and water are not available, use hand sanitizer. ? Change your dressing as told by your health care provider. ? Leave stitches (sutures), skin glue, or adhesive strips in place. These skin closures may need to stay in place for 2 weeks or  longer. If adhesive strip edges start to loosen and curl up, you may trim the loose edges. Do not remove adhesive strips completely unless your health care provider tells you to do that.  Keep incision areas clean, dry, and protected.  Check your incision areas every day for signs of infection. Check for: ? More redness, swelling, or pain. ? More fluid or blood. ? Warmth. ? Pus or a bad smell.  If incisions were made in your legs: ? Avoid crossing your legs. ? Avoid sitting for long periods of time. Change positions every 30 minutes. ? Raise (elevate) your legs when you are sitting. Bathing  Do not take baths, swim, or use a hot tub until your health care provider approves.  Only take sponge baths. Pat the incisions dry. Do not rub incisions with a washcloth or towel.  Ask your health care provider when you can shower. Eating and drinking  Eat foods that are high in fiber, such as raw fruits and vegetables, whole grains, beans, and nuts. Meats should be lean cut. Avoid canned, processed, and fried foods. This can help prevent constipation and is a recommended part of a heart-healthy diet.  Drink enough fluid to keep your urine clear or pale yellow.  Limit alcohol intake to no more than 1 drink a day for nonpregnant women and 2 drinks a day for men. One drink equals 12 oz of beer, 5 oz of wine, or 1 oz of hard liquor. Activity  Rest and limit your activity as told by your health  care provider. You may be instructed to: ? Stop any activity right away if you have chest pain, shortness of breath, irregular heartbeats, or dizziness. Get help right away if you have any of these symptoms. ? Move around frequently for short periods or take short walks as directed by your health care provider. Gradually increase your activities. You may need physical therapy or cardiac rehabilitation to help strengthen your muscles and build your endurance. ? Avoid lifting, pushing, or pulling anything that  is heavier than 10 lb (4.5 kg) for at least 6 weeks or as told by your health care provider.  Do not drive until your health care provider approves.  Ask your health care provider when you may return to work.  Ask your health care provider when you may resume sexual activity. General instructions  Do not use any products that contain nicotine or tobacco, such as cigarettes and e-cigarettes. If you need help quitting, ask your health care provider.  Take 2-3 deep breaths every few hours during the day, while you recover. This helps expand your lungs and prevent complications like pneumonia after surgery.  If you were given a device called an incentive spirometer, use it several times a day to practice deep breathing. Support your chest with a pillow or your arms when you take deep breaths or cough.  Wear compression stockings as told by your health care provider. These stockings help to prevent blood clots and reduce swelling in your legs.  Weigh yourself every day. This helps identify if your body is holding (retaining) fluid that may make your heart and lungs work harder.  Keep all follow-up visits as told by your health care provider. This is important. Contact a health care provider if:  You have more redness, swelling, or pain around any incision.  You have more fluid or blood coming from any incision.  Any incision feels warm to the touch.  You have pus or a bad smell coming from any incision  You have a fever.  You have swelling in your ankles or legs.  You have pain in your legs.  You gain 2 lb (0.9 kg) or more a day.  You are nauseous or you vomit.  You have diarrhea. Get help right away if:  You have chest pain that spreads to your jaw or arms.  You are short of breath.  You have a fast or irregular heartbeat.  You notice a "clicking" in your breastbone (sternum) when you move.  You have numbness or weakness in your arms or legs.  You feel dizzy or  light-headed. Summary  After the procedure, it is common to have pain or discomfort in the incision areas.  Do not take baths, swim, or use a hot tub until your health care provider approves.  Gradually increase your activities. You may need physical therapy or cardiac rehabilitation to help strengthen your muscles and build your endurance.  Weigh yourself every day. This helps identify if your body is holding (retaining) fluid that may make your heart and lungs work harder. This information is not intended to replace advice given to you by your health care provider. Make sure you discuss any questions you have with your health care provider. Document Released: 02/21/2005 Document Revised: 06/23/2016 Document Reviewed: 06/23/2016 Elsevier Interactive Patient Education  2018 [place].   Endoscopic Saphenous Vein Harvesting, Care After Refer to this sheet in the next few weeks. These instructions provide you with information about caring for yourself after your procedure.  Your health care provider may also give you more specific instructions. Your treatment has been planned according to current medical practices, but problems sometimes occur. Call your health care provider if you have any problems or questions after your procedure. What can I expect after the procedure? After the procedure, it is common to have:  Pain.  Bruising.  Swelling.  Numbness.  Follow these instructions at home: Medicine  Take over-the-counter and prescription medicines only as told by your health care provider.  Do not drive or operate heavy machinery while taking prescription pain medicine. Incision care   Follow instructions from your health care provider about how to take care of the cut made during surgery (incision). Make sure you: ? Wash your hands with soap and water before you change your bandage (dressing). If soap and water are not available, use hand sanitizer. ? Change your dressing as  told by your health care provider. ? Leave stitches (sutures), skin glue, or adhesive strips in place. These skin closures may need to be in place for 2 weeks or longer. If adhesive strip edges start to loosen and curl up, you may trim the loose edges. Do not remove adhesive strips completely unless your health care provider tells you to do that.  Check your incision area every day for signs of infection. Check for: ? More redness, swelling, or pain. ? More fluid or blood. ? Warmth. ? Pus or a bad smell. General instructions  Raise (elevate) your legs above the level of your heart while you are sitting or lying down.  Do any exercises your health care providers have given you. These may include deep breathing, coughing, and walking exercises.  Do not shower, take baths, swim, or use a hot tub unless told by your health care provider.  Wear your elastic stocking if told by your health care provider.  Keep all follow-up visits as told by your health care provider. This is important. Contact a health care provider if:  Medicine does not help your pain.  Your pain gets worse.  You have new leg bruises or your leg bruises get bigger.  You have a fever.  Your leg feels numb.  You have more redness, swelling, or pain around your incision.  You have more fluid or blood coming from your incision.  Your incision feels warm to the touch.  You have pus or a bad smell coming from your incision. Get help right away if:  Your pain is severe.  You develop pain, tenderness, warmth, redness, or swelling in any part of your leg.  You have chest pain.  You have trouble breathing. This information is not intended to replace advice given to you by your health care provider. Make sure you discuss any questions you have with your health care provider. Document Released: 04/16/2011 Document Revised: 01/10/2016 Document Reviewed: 06/18/2015 Elsevier Interactive Patient Education  2018  [employer].

## 2017-06-04 NOTE — Progress Notes (Signed)
Patient's spouse states he will be taking patient home at discharge. No further CSW needs.  CSW signing off.  Percell Locus Yalissa Fink LCSWA [phone]

## 2017-06-04 NOTE — Progress Notes (Signed)
CARDIAC REHAB PHASE I   PRE:  Rate/Rhythm: 68 SR    BP: sitting 116 /61    SaO2: 95 RA  MODE:  Ambulation: 330  ft   POST:  Rate/Rhythm: 72 SR    BP: sitting 130/66     SaO2: 92 RA  Pt improving, moving more independently. Used RW to walk in hall. Only x1 rest stop for SOB. To EOB. I will need to educate with daughter, will check back later. [place], ACSM 06/04/2017 9:30 AM

## 2017-06-04 NOTE — Care Management Note (Signed)
Case Management Note Original Note Created Zenon Mayo, RN 05/27/2017, 2:44 PM   Patient Details  Name: SHANETHA BRADHAM MRN: [id number] Date of Birth: [birth year]  Subjective/Objective:  From home with spouse, he states he works but between him and his daughter and son someone will be with patient, POD 1 CABG, d/c chest tubes, cont to wean drips.    10/12 Tomi Bamberger RN, BSN -,Patient states she has a PCP and medication coverage.  Spouse was asking questions about inpatient rehab, NCM informed him that physical therapy would be the one to rec that after they work with patient. He states he does not want SNF.  NCM saw after spoke with spouse, pt recs are for SNF.  NCM informed CSW of this information.                 Action/Plan: NCM will follow progression.   Expected Discharge Date:                  Expected Discharge Plan:  [place]  In-House Referral:     Discharge planning Services  CM Consult  Post Acute Care Choice:  Home Health Choice offered to:  Patient  DME Arranged:    DME Agency:     HH Arranged:  PT HH Agency:     Status of Service:  In process, will continue to follow  If discussed at Long Length of Stay Meetings, dates discussed:    Discharge Disposition: home/home health  Additional Comments:  06/04/17- 1300- Marvetta Gibbons RN, CM- referral for HHPT needs- spoke with pt  -pt states she has made arrangements to have someone be there at home and does not want to go to SNF-  choice offered for [facility] agency- list provided- pt would like to speak with spouse- CM will f/u in am prior to d/c regarding [place] choice and referral.   Dawayne Patricia, [place] 06/04/2017, 1:15 PM [phone]

## 2017-06-04 NOTE — Progress Notes (Signed)
RUA PICC line d/c'ed per order.  Site WNL.  Vaseline Gauze and dry 2x2 drsg applied to site.  Verbalizes understanding of site care, signs of infection, interventions for bleeding via teach back method.

## 2017-06-04 NOTE — Progress Notes (Addendum)
      [address]       [place] [address]             [phone]        9 Days Post-Op Procedure(s) (LRB): CORONARY ARTERY BYPASS GRAFTING (CABG), ON PUMP, TIMES Three, using left internal mammary artery and right greater saphenous vein harvested endoscopically (N/A) TRANSESOPHAGEAL ECHOCARDIOGRAM (TEE) (N/A)  Subjective: Patient did not sleep well;otherwise, no complaint.  Objective: Vital signs in last 24 hours: Temp:  [97.4 F (36.3 C)-98.6 F (37 C)] 97.4 F (36.3 C) (10/18 0420) Pulse Rate:  [56-72] 58 (10/18 0420) Cardiac Rhythm: Sinus bradycardia (10/18 0100) Resp:  [20-21] 21 (10/18 0420) BP: (101-125)/(59-67) 121/59 (10/18 0420) SpO2:  [94 %-99 %] 97 % (10/18 0420)  Pre op weight 83 kg Current Weight  06/03/17 185 lb 1.6 oz (84 kg)      Intake/Output from previous day: 10/17 0701 - 10/18 0700 In: 95 [P.O.:560; I.V.:30] Out: 350 [Urine:350]   Physical Exam:  Cardiovascular: RRR Pulmonary: Clear to auscultation bilaterally Abdomen: Soft, non tender, bowel sounds present. Extremities: Trace bilateral lower extremity edema. Wounds: Clean and dry.  No erythema or signs of infection.  Lab Results: CBC:  Recent Labs  06/02/17 0452  WBC 7.6  HGB 9.0*  HCT 27.0*  PLT 276   BMET:   Recent Labs  06/02/17 0452  NA 137  K 3.3*  CL 100*  CO2 29  GLUCOSE 107*  BUN 8  CREATININE 0.73  CALCIUM 8.5*    PT/INR:  Lab Results  Component Value Date   INR 1.46 05/26/2017   INR 0.96 01/02/2017   INR 1.07 05/19/2014   ABG:  INR: Will add last result for INR, ABG once components are confirmed Will add last 4 CBG results once components are confirmed  Assessment/Plan:  1. CV - Previous A fib with HR as high as 120's. Converted to sinus rhythm and remains in sinus rhythm/SB in the 60's this am. On Lopressor 12.5 mg bid, Amiodarone 200 mg bid, and Plavix 75 mg daily. 2.  Pulmonary - On room air.Encourage incentive  spirometer. 3. Volume Overload - On Lasix 40 mg daily 4.  Acute blood loss anemia - Last H and H stable at 9 and 27  5. Remove PICC line 6. Will need SNF and able to go when bed available  ZIMMERMAN,Bianca [name] 06/04/2017,7:26 AM  Patient now says tomorrow she will have people to care for at home Will need home pt therapy Plan d/c tomorrow I have seen and examined Bianca [name] and agree with the above assessment  and plan.  [name] MD [place] [phone] Office [phone] 06/04/2017 9:24 AM

## 2017-06-05 MED ORDER — POTASSIUM CHLORIDE CRYS ER 20 MEQ PO TBCR: 20.0000 meq | EXTENDED_RELEASE_TABLET | Freq: Every day | ORAL | 0 refills | Status: DC

## 2017-06-05 MED ORDER — AMIODARONE HCL 200 MG PO TABS
200.0000 mg | ORAL_TABLET | Freq: Every day | ORAL | Status: DC
  Administered 2017-06-05: 200 mg via ORAL
  Filled 2017-06-05: qty 1

## 2017-06-05 MED ORDER — AMIODARONE HCL 200 MG PO TABS: 200.0000 mg | ORAL_TABLET | Freq: Every day | ORAL | 1 refills | Status: DC

## 2017-06-05 MED ORDER — FUROSEMIDE 40 MG PO TABS: 40.0000 mg | ORAL_TABLET | Freq: Every day | ORAL | 0 refills | Status: DC

## 2017-06-05 NOTE — Care Management Note (Signed)
Case Management Note Original Note Created Zenon Mayo, RN 05/27/2017, 2:44 PM   Patient Details  Name: Bianca [name] MRN: [id number] Date of Birth: Jul 02, 1946  Subjective/Objective:  From home with spouse, he states he works but between him and his daughter and son someone will be with patient, POD 1 CABG, d/c chest tubes, cont to wean drips.    10/12 Bianca Bamberger RN, BSN -,Patient states she has a PCP and medication coverage.  Spouse was asking questions about inpatient rehab, NCM informed him that physical therapy would be the one to rec that after they work with patient. He states he does not want SNF.  NCM saw after spoke with spouse, pt recs are for SNF.  NCM informed CSW of this information.                 Action/Plan: NCM will follow progression.   Expected Discharge Date:  06/05/17               Expected Discharge Plan:  [place]  In-House Referral:  Clinical Social Work  Discharge planning Services  CM Consult  Post Acute Care Choice:  Home Health Choice offered to:  Patient  DME Arranged:  N/A DME Agency:  NA  HH Arranged:  PT [place] Agency:  [facility] [employer]  Status of Service:  Completed, signed off  If discussed at [employer], dates discussed:    Discharge Disposition: home/home health  Additional Comments:  06/05/17- 1100- Bianca Gartland RN, CM- f/u done with pt at bedside regarding choice for [facility] agency per pt she would like to use [facility]- [facility] to [place] [facility]- referral accepted for HHPT- no DME need noted.   06/04/17- 1300- Bianca Henthorn RN, CM- referral for HHPT needs- spoke with pt  -pt states she has made arrangements to have someone be there at home and does not want to go to SNF-  choice offered for [facility] agency- list provided- pt would like to speak with spouse- CM will f/u in am prior to d/c regarding [place] choice and referral.   Dawayne Patricia, RN 06/05/2017, 11:28 AM [phone]

## 2017-06-05 NOTE — Progress Notes (Signed)
Physical Therapy Treatment Patient Details Name: Bianca [name] MRN: [id number] DOB: 1945/10/27 Today's Date: 06/05/2017    History of Present Illness Pt underwent CABG x 3..    PT Comments    Patient continues to make progress toward mobility goals and tolerated stair negotiation this session. Pt needs mod cues throughout session to maintain sternal precautions. Continue to progress as tolerated.    Follow Up Recommendations  SNF;Supervision/Assistance - 24 hour     Equipment Recommendations  Other (comment) (TBA)    Recommendations for Other Services       Precautions / Restrictions Precautions Precautions: Fall;Sternal Precaution Comments: sternal precautions reviewed with pt Restrictions Weight Bearing Restrictions: Yes (STERNAL PRECAUTIONS)    Mobility  Bed Mobility Overal bed mobility: Needs Assistance Bed Mobility: Rolling;Sidelying to Sit Rolling: Min guard Sidelying to sit: Min guard       General bed mobility comments: min guard for safety; cues for technique  Transfers Overall transfer level: Needs assistance Equipment used: Rolling walker (2 wheeled) Transfers: Sit to/from Stand Sit to Stand: Min guard         General transfer comment: min guard for safety; cues for technique and maintaining sternal precautions  Ambulation/Gait Ambulation/Gait assistance: Min guard Ambulation Distance (Feet): 300 Feet Assistive device: Rolling walker (2 wheeled) Gait Pattern/deviations: Step-through pattern;Trunk flexed;Decreased stride length Gait velocity: decreased   General Gait Details: standing rest breaks due to DOE and chest pain; cues for posture, proximity of RW   Stairs Stairs: Yes   Stair Management: One rail Left;Step to pattern;Forwards Number of Stairs: 2 General stair comments: cues for sequencing and assist to steady  Wheelchair Mobility    Modified Rankin (Stroke Patients Only)       Balance Overall balance assessment: Needs  assistance Sitting-balance support: No upper extremity supported;Feet supported Sitting balance-Leahy Scale: Good       Standing balance-Leahy Scale: Fair Standing balance comment: pt able to static stand without UE support                            Cognition Arousal/Alertness: Awake/alert Behavior During Therapy: WFL for tasks assessed/performed Overall Cognitive Status: Within Functional Limits for tasks assessed                                 General Comments: mod cues for sternal precautions      Exercises      General Comments        Pertinent Vitals/Pain Pain Assessment: Faces Faces Pain Scale: Hurts little more Pain Location: chest incision Pain Descriptors / Indicators: Grimacing;Guarding;Sore Pain Intervention(s): Limited activity within patient's tolerance;Monitored during session;Premedicated before session;Repositioned    Home Living                      Prior Function            PT Goals (current goals can now be found in the care plan section) Acute Rehab PT Goals Patient Stated Goal: to get better PT Goal Formulation: With patient Time For Goal Achievement: 06/11/17 Potential to Achieve Goals: Good Progress towards PT goals: Progressing toward goals    Frequency    Min 3X/week      PT Plan Current plan remains appropriate    Co-evaluation              AM-PAC PT "6 Clicks" Daily Activity  Outcome Measure  Difficulty turning over in bed (including adjusting bedclothes, sheets and blankets)?: A Lot Difficulty moving from lying on back to sitting on the side of the bed? : Unable Difficulty sitting down on and standing up from a chair with arms (e.g., wheelchair, bedside commode, etc,.)?: Unable Help needed moving to and from a bed to chair (including a wheelchair)?: A Little Help needed walking in hospital room?: A Little Help needed climbing 3-5 steps with a railing? : A Little 6 Click Score:  13    End of Session Equipment Utilized During Treatment: Gait belt Activity Tolerance: Patient tolerated treatment well Patient left: with call bell/phone within reach;in bed Nurse Communication: Mobility status PT Visit Diagnosis: Muscle weakness (generalized) (M62.81);Pain Pain - part of body:  (chest)     Time: 4401-0272 PT Time Calculation (min) (ACUTE ONLY): 26 min  Charges:  $Gait Training: 8-22 mins $Therapeutic Activity: 8-22 mins                    G Codes:       Earney Navy, PTA Pager: [phone]     Darliss Cheney 06/05/2017, 4:12 PM

## 2017-06-05 NOTE — Progress Notes (Signed)
Pt was d/c'd per md order. Pt was stable leaving the floor. AVS was given and questions were answered. Pt left floor via wheelchair to private vehicle with husband.

## 2017-06-05 NOTE — Progress Notes (Signed)
CARDIAC REHAB PHASE I   PRE:  Rate/Rhythm: 60 RS  BP:  Sitting: 108/65        SaO2: 95 RA  MODE:  Ambulation: 200 ft   POST:  Rate/Rhythm: 63 SR  BP:  Sitting: 113/77         SaO2: 95 RA  Pt ambulated 200 ft on RA, rolling walker, assist x1, slow, mostly steady gait, tolerated well. Pt c/o "feeling a little winded," standing rest x1. Cardiac surgery discharge education completed. Reviewed risk factors, tobacco cessation (gave pt fake cigarette), IS, sternal precautions, activity progression, exercise, heart healthy diet, daily weights and phase 2 cardiac rehab. Pt verbalized understanding. Pt agrees to phase 2 cardiac rehab referral, has done the program in the past, will send referral to [facility] per pt request. Pt to edge of bed per pt request after walk, call bell within reach.   [phone] Lenna Sciara, RN, BSN 06/05/2017 11:10 AM

## 2017-06-05 NOTE — Progress Notes (Signed)
Pt currently has no IV access and refused to have a new one put in.  Lupita Dawn, RN

## 2017-06-05 NOTE — Progress Notes (Addendum)
      [address]       [place] [address]             [phone]        10 Days Post-Op Procedure(s) (LRB): CORONARY ARTERY BYPASS GRAFTING (CABG), ON PUMP, TIMES Three, using left internal mammary artery and right greater saphenous vein harvested endoscopically (N/A) TRANSESOPHAGEAL ECHOCARDIOGRAM (TEE) (N/A)  Subjective: Patient awakened this am. She has no complaints. She is looking forward to going home.  Objective: Vital signs in last 24 hours: Temp:  [97.8 F (36.6 C)-98.5 F (36.9 C)] 98.1 F (36.7 C) (10/19 0451) Pulse Rate:  [55-70] 55 (10/19 0451) Cardiac Rhythm: Sinus bradycardia (10/19 0122) Resp:  [14-21] 14 (10/19 0451) BP: (108-154)/(59-76) 116/59 (10/19 0451) SpO2:  [91 %-98 %] 91 % (10/19 0451) Weight:  [166 lb 12.8 oz (75.7 kg)] 166 lb 12.8 oz (75.7 kg) (10/19 0451)  Pre op weight 83 kg Current Weight  06/05/17 166 lb 12.8 oz (75.7 kg)      Intake/Output from previous day: 10/18 0701 - 10/19 0700 In: 280 [P.O.:250; I.V.:30] Out: -    Physical Exam:  Cardiovascular: RRR Pulmonary: Clear to auscultation bilaterally Abdomen: Soft, non tender, bowel sounds present. Extremities: Trace bilateral lower extremity edema. Wounds: Clean and dry.  No erythema or signs of infection.  Lab Results: CBC: No results for input(s): WBC, HGB, HCT, PLT in the last 72 hours. BMET:  No results for input(s): NA, K, CL, CO2, GLUCOSE, BUN, CREATININE, CALCIUM in the last 72 hours.  PT/INR:  Lab Results  Component Value Date   INR 1.46 05/26/2017   INR 0.96 01/02/2017   INR 1.07 05/19/2014   ABG:  INR: Will add last result for INR, ABG once components are confirmed Will add last 4 CBG results once components are confirmed  Assessment/Plan:  1. CV - Previous A fib with HR as high as 120's. Converted to sinus rhythm and remains in sinus rhythm/SB in the 60's this am. On Lopressor 12.5 mg bid, Amiodarone 200 mg bid, and Plavix 75 mg daily.  Will decrease Amiodarone to once daily as HR mostly in the high 50's to low 60's. 2.  Pulmonary - On room air.Encourage incentive spirometer. 3. Volume Overload - On Lasix 40 mg daily 4.  Acute blood loss anemia - Last H and H stable at 9 and 27  5. Discharge  ZIMMERMAN,DONIELLE MPA-C 06/05/2017,7:07 AM  I have seen and examined Bianca [name] and agree with the above assessment  and plan.  [name] MD Beeper [phone] Office [phone] 06/05/2017 10:16 AM

## 2017-06-08 ENCOUNTER — Telehealth (HOSPITAL_COMMUNITY): Payer: Self-pay

## 2017-06-08 NOTE — Telephone Encounter (Signed)
Patient insurance is active and benefits verified. Patient insurance is [employer] - no co-pay, deductible $1000/$1000 has been met, out of pocket $6350/$6350, no co-insurance and no pre-authorization. Passport/reference [phone].  Patient will be contacted and scheduled after their follow up appointment with the cardiologist on 06/17/17 and surgeon on 07/02/17, upon review by [facility] RN navigator.

## 2017-06-08 NOTE — Telephone Encounter (Signed)
Patient insurance is active and benefits verified. Patient insurance is [employer] - no co-pay, deductible $1000/$1000 has been met, out of pocket $6350/$6350, no co-insurance and no pre-authorization. Passport/reference [phone].  Patient will be contacted and scheduled after their follow up appointment with the cardiologist on 06/17/17 and surgeon on 07/02/17, upon review by [facility] RN navigator.

## 2017-06-17 ENCOUNTER — Encounter: Payer: Self-pay | Admitting: Physician Assistant

## 2017-06-17 ENCOUNTER — Ambulatory Visit (INDEPENDENT_AMBULATORY_CARE_PROVIDER_SITE_OTHER): Payer: 59 | Admitting: Physician Assistant

## 2017-06-17 VITALS — BP 98/40 | HR 43 | Ht 61.0 in | Wt 175.4 lb

## 2017-06-17 DIAGNOSIS — E785 Hyperlipidemia, unspecified: Secondary | ICD-10-CM

## 2017-06-17 DIAGNOSIS — I340 Nonrheumatic mitral (valve) insufficiency: Secondary | ICD-10-CM | POA: Diagnosis not present

## 2017-06-17 DIAGNOSIS — I2510 Atherosclerotic heart disease of native coronary artery without angina pectoris: Secondary | ICD-10-CM | POA: Diagnosis not present

## 2017-06-17 DIAGNOSIS — R001 Bradycardia, unspecified: Secondary | ICD-10-CM

## 2017-06-17 DIAGNOSIS — I9789 Other postprocedural complications and disorders of the circulatory system, not elsewhere classified: Secondary | ICD-10-CM | POA: Diagnosis not present

## 2017-06-17 DIAGNOSIS — I4891 Unspecified atrial fibrillation: Secondary | ICD-10-CM

## 2017-06-17 NOTE — Patient Instructions (Addendum)
Medication Instructions:  1. STOP METOPROLOL  Labwork: TODAY: BMET  Testing/Procedures: NONE ORDERED  Follow-Up: 08/28/17 @ 4:20 WITH DR. Tamala Julian   Any Other Special Instructions Will Be Listed Below (If Applicable).     If you need a refill on your cardiac medications before your next appointment, please call your pharmacy.

## 2017-06-17 NOTE — Progress Notes (Signed)
Cardiology Office Note:    Date:  06/17/2017   ID:  Bianca [name], DOB [birth year], MRN [id number]  PCP:  [name], NP  Cardiologist:  Dr. Dorris Carnes   Electrophysiologist:  N/a Pulmonology: Dr. Melvyn Novas  Referring MD: [name], NP   Chief Complaint  Patient presents with  . Hospitalization Follow-up    Status post CABG    History of Present Illness:    Bianca [name] is a 71 y.o. female with a hx of CAD, s/p MI in 1997 treated with PCI to the RCA, s/p Taxus DES x3 the RCA in 2007 in the setting of MI, HTN, HL. Echo 7/10: EF 55-60%, mild MR. Admitted in 10/13 with a NSTEMI tx with DES to RCA and POBA to PDA.  Myoview in 5/18 was negative for ischemia.   The patient was last seen by Dr. Harrington Challenger in 9/18.  She was admitted 10/8-10/19 with unstable angina.  Cardiac catheterization demonstrated severe LCx and LAD disease.  Emergent CABG was recommended by Dr. Servando Snare.  CABG with LIMA-LAD, SVG-OM1, SVG-PDA.  Postoperative course was complicated by atrial fibrillation with rapid ventricular rate.  She converted to normal sinus rhythm on IV amiodarone.  Amiodarone was discontinued several days later secondary to nausea.  However, she had recurrent atrial fibrillation and was placed back on amiodarone.  She was not placed on anticoagulation.  Cardiology did recommend consideration for outpatient event monitor to assess for recurrent atrial fibrillation.  Due to deconditioning, she was initially to be discharged to SNF.  However, she ultimately went home with family with home health physical therapy.  Bianca [name] returns for follow-up.  She is here today with her daughter and her husband.  Since discharge from the hospital, her chest has been sore.  She has been short of breath with activity.  She thinks that this is somewhat better.  Her weight has come down since discharge.  She denies PND or significant edema.  She has been dizzy at times.  She denies syncope.  Prior CV studies:   The  following studies were reviewed today:  Intraoperative TEE 05/26/17   EF 50-55, mild to moderate MR  Cardiac catheterization 05/26/17  LAD mid 95, ostial D1 60 LCx proximal 75, OM1 90 RCA proximal stent 100 EF 35-45  Nuclear stress test 12/31/16 1. EF 62%, normal wall motion.  2. No evidence for ischemia or infarction.  Normal study.   Echo 01/27/16 EF 55-60%, normal wall motion, trivial MR, trivial TR  Myoview 5/15  1. Normal myocardial perfusion study. No fixed or reversible defects. 2. Calculated ejection fraction of 67%.  LHC 05/18/12:  LAD mid 60-70%, D1 ostial 20%,  CFX mid 50%,  RCA prox to distal with multiple overlapping stents, mid 40-50% ISR, distal 60/95%, mPDA 80%,  EF 60%.  PCI: Xience expedition DES to the RCA and balloon angioplasty to the PDA.   Past Medical History:  Diagnosis Date  . Arthritis   . Blood transfusion without reported diagnosis   . CAD (coronary artery disease)    a. 1997 MI/PCI RCA;  b. 2007 MI/PCI of 100% RCA with Taxus DES x 3 placed, EF 55%;  c. 2010 Cath: stable anatomy;  d. 05/2012 NSTEMI/Cath/PCI: LM nl, LAD 60-49m, D1 20ost, LCX 80m, RCA 40-75m ISR, 60/95d (Treated w/ 2.75x33 Xience Xpedition DES), PDA 71m (Treated w/ PTCA), EF 60%.  . Carpal tunnel syndrome on both sides   . Chronic lower back pain   . Depression   .  GERD (gastroesophageal reflux disease)   . Hyperlipidemia   . Myocardial infarction [facility]) 1997, 2007, 2013  . Numbness of foot    Left foot - back surgery 2009    Past Surgical History:  Procedure Laterality Date  . ANKLE SURGERY  Years ago   Left; "had to take out a floater"  . CARPAL TUNNEL RELEASE Left 10/12016  . CESAREAN SECTION  1990  . CORONARY ANGIOPLASTY WITH STENT PLACEMENT  1997; 2007, 2013   "2 + 2; + 1= total of 5  . CORONARY ARTERY BYPASS GRAFT N/A 05/26/2017   Procedure: CORONARY ARTERY BYPASS GRAFTING (CABG), ON PUMP, TIMES Three, using left internal mammary artery and right greater saphenous  vein harvested endoscopically;  Surgeon: [name], MD;  Location: [place];  Service: Open Heart Surgery;  Laterality: N/A;  LIMA to LAD, SVG to OM 1, SVG to PDA  . LEFT HEART CATH AND CORONARY ANGIOGRAPHY N/A 05/26/2017   Procedure: LEFT HEART CATH AND CORONARY ANGIOGRAPHY;  Surgeon: Belva Crome, MD;  Location: [place] [facility];  Service: Cardiovascular;  Laterality: N/A;  . LEFT HEART CATHETERIZATION WITH CORONARY ANGIOGRAM N/A 05/18/2012   Procedure: LEFT HEART CATHETERIZATION WITH CORONARY ANGIOGRAM;  Surgeon: Wellington Hampshire, MD;  Location: [place] CATH [facility];  Service: Cardiovascular;  Laterality: N/A;  . [place]; 2009  . TEE WITHOUT CARDIOVERSION N/A 05/26/2017   Procedure: TRANSESOPHAGEAL ECHOCARDIOGRAM (TEE);  Surgeon: [name], MD;  Location: [place];  Service: Open Heart Surgery;  Laterality: N/A;  . TOTAL HIP ARTHROPLASTY Right 05/24/2014   Procedure: RIGHT TOTAL HIP ARTHROPLASTY ANTERIOR APPROACH;  Surgeon: Gearlean Alf, MD;  Location: [facility];  Service: Orthopedics;  Laterality: Right;    Current Medications: Current Meds  Medication Sig  . acetaminophen (TYLENOL) 325 MG tablet Take 2 tablets (650 mg total) by mouth every 6 (six) hours as needed for fever, headache, mild pain or moderate pain.  Marland Kitchen amiodarone (PACERONE) 200 MG tablet Take 1 tablet (200 mg total) by mouth daily.  Marland Kitchen atorvastatin (LIPITOR) 80 MG tablet Take 1 tablet (80 mg total) by mouth daily at 6 PM.  . baclofen (LIORESAL) 10 MG tablet Take 10 mg by mouth at bedtime.  Marland Kitchen buPROPion (WELLBUTRIN XL) 150 MG 24 hr tablet Take 150 mg by mouth daily.  . clopidogrel (PLAVIX) 75 MG tablet Take 1 tablet (75 mg total) by mouth daily.  . cyanocobalamin (,VITAMIN B-12,) 1000 MCG/ML injection Inject 1,000 mcg into the muscle every 30 (thirty) days. Vitamin B12   - last injection 05/22/17  . dexlansoprazole (DEXILANT) 60 MG capsule Take 1 capsule (60 mg total) by mouth daily.  Marland Kitchen escitalopram  (LEXAPRO) 20 MG tablet Take 20 mg by mouth daily.  . famotidine (PEPCID) 20 MG tablet Take 1 tablet (20 mg total) by mouth at bedtime. 1-2 hours before bed  . fluticasone (FLONASE) 50 MCG/ACT nasal spray Place 2 sprays into both nostrils daily as needed for allergies or rhinitis.  . fluticasone (FLOVENT HFA) 220 MCG/ACT inhaler Inhale 1 puff into the lungs 2 (two) times daily.  Marland Kitchen gabapentin (NEURONTIN) 300 MG capsule Take 300 mg by mouth at bedtime.   . hydrOXYzine (ATARAX/VISTARIL) 25 MG tablet Take 25 mg by mouth at bedtime.  . meclizine (ANTIVERT) 25 MG tablet Take 1 tablet (25 mg total) by mouth 2 (two) times daily.  . Methen-Hyosc-Meth Blue-Na Phos (ME/NAPHOS/MB/HYO1 PO) Take 1 tablet by mouth every 6 (six) hours as needed (  vaginal burning).   . Multiple Vitamin (MULTIVITAMIN WITH MINERALS) TABS tablet Take 1 tablet by mouth daily.  . pentosan polysulfate (ELMIRON) 100 MG capsule Take 200 mg by mouth 2 (two) times daily. For pain  . polyvinyl alcohol (ARTIFICIAL TEARS) 1.4 % ophthalmic solution Place 1 drop into both eyes 2 (two) times daily as needed for dry eyes.  Marland Kitchen PREMARIN vaginal cream USE 1/2 GRAM VAGINALLY EVERY NIGHT AT BEDTIME FOR THE 1ST 2 WEEKS,, THEN 1/2 GRAM 2-3 TIMES A WEEK  . Probiotic Product (PROBIOTIC PO) Take 1 capsule by mouth at bedtime. Nature Bounty's brand  . Red Yeast Rice Extract (RED YEAST RICE PO) Take 1 capsule by mouth at bedtime.  . [DISCONTINUED] metoprolol tartrate (LOPRESSOR) 25 MG tablet Take 0.5 tablets (12.5 mg total) by mouth 2 (two) times daily.     Allergies:   Oxycodone hcl; Penicillins; Macrobid [nitrofurantoin monohyd macro]; Sulfa antibiotics; Tramadol; and Aspirin   Social History  Substance Use Topics  . Smoking status: Light Tobacco Smoker    Packs/day: 0.50    Years: 50.00    Types: Cigarettes  . Smokeless tobacco: Never Used     Comment: smokes a half pack a day  . Alcohol use No     Family Hx: The patient's family history includes  Cancer in her paternal grandfather; Coronary artery disease in her mother; Dementia in her father; Diabetes in her mother; Hypertension in her mother; Sudden death in her brother.  ROS:   Please see the history of present illness.    ROS All other systems reviewed and are negative.   EKGs/Labs/Other Test Reviewed:    EKG:  EKG is  ordered today.  The ekg ordered today demonstrates sinus bradycardia, heart rate 43, normal axis, nonspecific ST-T wave changes  Recent Labs: 01/02/2017: ALT 25 05/08/2017: NT-Pro BNP 226 05/28/2017: TSH 5.685 06/02/2017: BUN 8; Creatinine, Ser 0.73; Hemoglobin 9.0; Magnesium 1.9; Platelets 276; Potassium 3.3; Sodium 137   Recent Lipid Panel Lab Results  Component Value Date/Time   CHOL 233 (H) 10/31/2016 09:15 AM   TRIG 316.0 (H) 10/31/2016 09:15 AM   HDL 48.80 10/31/2016 09:15 AM   CHOLHDL 5 10/31/2016 09:15 AM   LDLCALC 106 (H) 01/03/2014 09:39 AM   LDLDIRECT 129.0 10/31/2016 09:15 AM    Physical Exam:    VS:  BP (!) 98/40   Pulse (!) 43   Ht 5\' 1"  (1.549 m)   Wt 175 lb 6.4 oz (79.6 kg)   LMP  (LMP Unknown)   SpO2 97%   BMI 33.14 kg/m     Wt Readings from Last 3 Encounters:  06/17/17 175 lb 6.4 oz (79.6 kg)  06/05/17 166 lb 12.8 oz (75.7 kg)  05/19/17 183 lb (83 kg)     Physical Exam  Constitutional: She is oriented to person, place, and time. She appears well-developed and well-nourished. No distress.  HENT:  Head: Normocephalic and atraumatic.  Neck: No JVD present.  Cardiovascular: Normal rate and regular rhythm.   No murmur heard. Pulmonary/Chest: She has no rales.  Median sternotomy well healed without erythema or d/c  Abdominal: Soft.  Musculoskeletal: She exhibits no edema.  Neurological: She is alert and oriented to person, place, and time.  Skin: Skin is warm and dry.  Psychiatric: She has a normal mood and affect.    ASSESSMENT:    1. Coronary artery disease involving native coronary artery of native heart without  angina pectoris   2. Postoperative atrial fibrillation (HCC)  3. Bradycardia   4. Hyperlipidemia, unspecified hyperlipidemia type   5. Mitral valve insufficiency, unspecified etiology    PLAN:    In order of problems listed above:  1.  Coronary artery disease involving native coronary artery of native heart without angina pectoris  Status post multiple percutaneous coronary interventions in the past and subsequent coronary artery bypass grafting in October 2018.  She did have a reduced ejection fraction with EF 35-45% at the time of her cardiac catheterization.  However, her EF was improved at the time of her bypass by intraoperative TEE at 50-55.  She is slowly recovering.  Both her and her family had significant questions about her care prior to her symptoms that led to her hospitalization.  We discussed how her stress test could be normal in the setting of three-vessel disease causing balanced ischemia.  In any event, the patient previously was followed by Dr. Tamala Julian.  He did her heart catheterization during this most recent hospitalization.  She would like to follow-up with Dr. Tamala Julian in the future.  -Continue statin, Plavix  -Continue home health PT then transition to cardiac rehab  -I have completed handicap placard for her today  -BMET today  2.  Postoperative atrial fibrillation (HCC) Containing normal sinus rhythm.  Continue amiodarone for now.  Hopefully, this can be discontinued at follow-up.  At this point, I do not think that she needs a follow-up event monitor.  3.  Bradycardia  Somewhat symptomatic.  Her blood pressure is running low.  I have recommended stopping her metoprolol.  She knows to contact us if her symptoms do not improve.  4.  Hyperlipidemia, unspecified hyperlipidemia type Continue statin.  She will need follow-up lipids and LFTs arranged at her next visit.  5.  Mitral valve insufficiency, unspecified etiology Mild to moderate by intraoperative transesophageal  echocardiogram.   Dispo:  Return in about 8 weeks (around 08/12/2017) for Routine Follow Up with Dr. Tamala Julian.   Medication Adjustments/Labs and Tests Ordered: Current medicines are reviewed at length with the patient today.  Concerns regarding medicines are outlined above.  Tests Ordered: Orders Placed This Encounter  Procedures  . Basic Metabolic Panel (BMET)  . EKG 12-Lead   Medication Changes: No orders of the defined types were placed in this encounter.   Signed, Richardson Dopp, PA-C  06/17/2017 5:09 PM    [facility] [address], [place], [place]  [address] Phone: [phone]; Fax: [phone]

## 2017-06-18 ENCOUNTER — Telehealth (HOSPITAL_COMMUNITY): Payer: Self-pay

## 2017-06-18 LAB — BASIC METABOLIC PANEL
BUN/Creatinine Ratio: 9 — ABNORMAL LOW (ref 12–28)
BUN: 10 mg/dL (ref 8–27)
CALCIUM: 9.1 mg/dL (ref 8.7–10.3)
CHLORIDE: 103 mmol/L (ref 96–106)
CO2: 22 mmol/L (ref 20–29)
CREATININE: 1.06 mg/dL — AB (ref 0.57–1.00)
GFR calc Af Amer: 61 mL/min/{1.73_m2} (ref >59)
GFR calc non Af Amer: 53 mL/min/{1.73_m2} — ABNORMAL LOW (ref >59)
GLUCOSE: 99 mg/dL (ref 65–99)
Potassium: 4.9 mmol/L (ref 3.5–5.2)
Sodium: 141 mmol/L (ref 134–144)

## 2017-06-18 NOTE — Telephone Encounter (Signed)
Attempted to call patient in regards to Cardiac Rehab - No voicemail is set up.

## 2017-06-23 ENCOUNTER — Encounter (HOSPITAL_COMMUNITY): Payer: Self-pay

## 2017-06-26 ENCOUNTER — Ambulatory Visit (INDEPENDENT_AMBULATORY_CARE_PROVIDER_SITE_OTHER): Payer: 59 | Admitting: *Deleted

## 2017-06-26 ENCOUNTER — Other Ambulatory Visit: Payer: Self-pay | Admitting: Cardiothoracic Surgery

## 2017-06-26 VITALS — BP 137/57 | HR 55 | Ht 61.0 in | Wt 177.8 lb

## 2017-06-26 DIAGNOSIS — Z951 Presence of aortocoronary bypass graft: Secondary | ICD-10-CM

## 2017-06-26 DIAGNOSIS — I495 Sick sinus syndrome: Secondary | ICD-10-CM

## 2017-06-26 NOTE — Progress Notes (Signed)
1.  Reason for visit: Follow up EKG for Bradycardia per Richardson Dopp PA.  2.  Name of MD requesting visit: per Richardson Dopp PA.    3. H&P:  SEE CHART  4.  ROS related to problem:  N/A  5.  Assessment and plan per MD:  Patient came in for EKG due to bradycardia at last OV with Richardson Dopp PA. Metoprolol was stopped at that visit. Patient comes in today for follow up EKG showing bradycardia with hr of 52 bpm. EKG reviewed with Dr. Angelena Form no orders received.

## 2017-06-26 NOTE — Patient Instructions (Signed)
Medication Instructions:  Your physician recommends that you continue on your current medications as directed. Please refer to the Current Medication list given to you today.  * If you need a refill on your cardiac medications before your next appointment, please call your pharmacy. *  Labwork: None ordered  Testing/Procedures: None ordered  Follow-Up: Keep currently scheduled follow up appointments  Thank you for choosing [facility]!!

## 2017-06-26 NOTE — Progress Notes (Signed)
Continue current medications and follow up as planned.  Richardson Dopp, PA-C 06/26/2017 1:17 PM

## 2017-06-29 ENCOUNTER — Ambulatory Visit
Admission: RE | Admit: 2017-06-29 | Discharge: 2017-06-29 | Disposition: A | Discharge status: Home / Self Care | Payer: 59 | Source: Ambulatory Visit | Attending: Cardiothoracic Surgery | Admitting: Cardiothoracic Surgery

## 2017-06-29 ENCOUNTER — Ambulatory Visit (INDEPENDENT_AMBULATORY_CARE_PROVIDER_SITE_OTHER): Payer: Self-pay | Admitting: Physician Assistant

## 2017-06-29 ENCOUNTER — Other Ambulatory Visit: Payer: Self-pay

## 2017-06-29 VITALS — BP 120/72 | HR 52 | Resp 16 | Ht 61.0 in | Wt 177.0 lb

## 2017-06-29 DIAGNOSIS — Z951 Presence of aortocoronary bypass graft: Secondary | ICD-10-CM

## 2017-06-29 DIAGNOSIS — I2510 Atherosclerotic heart disease of native coronary artery without angina pectoris: Secondary | ICD-10-CM

## 2017-06-29 NOTE — Patient Instructions (Signed)
She may follow-up with [place] on an as-needed basis. She is now able to increase her activity level as tolerated. We will place a cardiac rehabilitation referral. She may drive a motor vehicle as long as she is no longer taking pain medicine.  Make every effort to stay physically active, get some type of exercise on a regular basis, and stick to a "heart healthy diet".  The long term benefits for regular exercise and a healthy diet are critically important to your overall health and wellbeing.

## 2017-06-29 NOTE — Progress Notes (Signed)
Bianca [name] is a 71 y.o. female patient who returns for her routine follow-up. She had a CABG on 05/26/2017.    1. CAD, multiple vessel   2. S/P CABG x 3    Past Medical History:  Diagnosis Date  . Arthritis   . Blood transfusion without reported diagnosis   . CAD (coronary artery disease)    a. 1997 MI/PCI RCA;  b. 2007 MI/PCI of 100% RCA with Taxus DES x 3 placed, EF 55%;  c. 2010 Cath: stable anatomy;  d. 05/2012 NSTEMI/Cath/PCI: LM nl, LAD 60-64m, D1 20ost, LCX 46m, RCA 40-109m ISR, 60/95d (Treated w/ 2.75x33 Xience Xpedition DES), PDA 20m (Treated w/ PTCA), EF 60%.  . Carpal tunnel syndrome on both sides   . Chronic lower back pain   . Depression   . GERD (gastroesophageal reflux disease)   . Hyperlipidemia   . Myocardial infarction Bianca [name]) 1997, 2007, 2013  . Numbness of foot    Left foot - back surgery 2009   No past surgical history pertinent negatives on file. Scheduled Meds: Current Outpatient Medications on File Prior to Visit  Medication Sig Dispense Refill  . acetaminophen (TYLENOL) 325 MG tablet Take 2 tablets (650 mg total) by mouth every 6 (six) hours as needed for fever, headache, mild pain or moderate pain.    Marland Kitchen amiodarone (PACERONE) 200 MG tablet Take 1 tablet (200 mg total) by mouth daily. 30 tablet 1  . atorvastatin (LIPITOR) 80 MG tablet Take 1 tablet (80 mg total) by mouth daily at 6 PM. 30 tablet 1  . baclofen (LIORESAL) 10 MG tablet Take 10 mg by mouth at bedtime.  0  . buPROPion (WELLBUTRIN XL) 150 MG 24 hr tablet Take 150 mg by mouth daily.    . clopidogrel (PLAVIX) 75 MG tablet Take 1 tablet (75 mg total) by mouth daily. 90 tablet 3  . cyanocobalamin (,VITAMIN B-12,) 1000 MCG/ML injection Inject 1,000 mcg into the muscle every 30 (thirty) days. Vitamin B12   - last injection 05/22/17    . dexlansoprazole (DEXILANT) 60 MG capsule Take 1 capsule (60 mg total) by mouth daily. 30 capsule 2  . escitalopram (LEXAPRO) 20 MG tablet Take 20 mg by mouth daily.  1  .  famotidine (PEPCID) 20 MG tablet Take 1 tablet (20 mg total) by mouth at bedtime. 1-2 hours before bed    . fluticasone (FLONASE) 50 MCG/ACT nasal spray Place 2 sprays into both nostrils daily as needed for allergies or rhinitis.    . fluticasone (FLOVENT HFA) 220 MCG/ACT inhaler Inhale 1 puff into the lungs 2 (two) times daily. 1 Inhaler 3  . gabapentin (NEURONTIN) 300 MG capsule Take 300 mg by mouth at bedtime.     . hydrOXYzine (ATARAX/VISTARIL) 25 MG tablet Take 25 mg by mouth at bedtime.    . meclizine (ANTIVERT) 25 MG tablet Take 1 tablet (25 mg total) by mouth 2 (two) times daily. 30 tablet 0  . Methen-Hyosc-Meth Blue-Na Phos (ME/NAPHOS/MB/HYO1 PO) Take 1 tablet by mouth every 6 (six) hours as needed (vaginal burning).     . Multiple Vitamin (MULTIVITAMIN WITH MINERALS) TABS tablet Take 1 tablet by mouth daily.    . pentosan polysulfate (ELMIRON) 100 MG capsule Take 200 mg by mouth 2 (two) times daily. For pain    . polyvinyl alcohol (ARTIFICIAL TEARS) 1.4 % ophthalmic solution Place 1 drop into both eyes 2 (two) times daily as needed for dry eyes.    Marland Kitchen PREMARIN vaginal cream  USE 1/2 GRAM VAGINALLY EVERY NIGHT AT BEDTIME FOR THE 1ST 2 WEEKS,, THEN 1/2 GRAM 2-3 TIMES A WEEK 30 g 1  . Probiotic Product (PROBIOTIC PO) Take 1 capsule by mouth at bedtime. Nature Bounty's brand    . Red Yeast Rice Extract (RED YEAST RICE PO) Take 1 capsule by mouth at bedtime.     No current facility-administered medications on file prior to visit.     Allergies  Allergen Reactions  . Oxycodone Hcl Itching and Other (See Comments)    Confusion, hallucinations  . Penicillins Itching and Rash    Has patient had a PCN reaction causing immediate rash, facial/tongue/throat swelling, SOB or lightheadedness with hypotension: yes rash that took a while to go away across the abdomen Has patient had a PCN reaction causing severe rash involving mucus membranes or skin necrosis: no Has patient had a PCN reaction that  required hospitalization: no Has patient had a PCN reaction occurring within the last 10 years: No If all of the above answers are "NO", then may proceed with Cephalosporin use.   Marland Kitchen Macrobid [Nitrofurantoin Monohyd Macro] Nausea And Vomiting  . Sulfa Antibiotics Nausea Only  . Tramadol Itching and Other (See Comments)    hallucinations  . Aspirin Nausea And Vomiting and Other (See Comments)    Burning in stomach    Blood pressure 120/72, pulse (!) 52, resp. rate 16, height 5\' 1"  (1.549 m), weight 177 lb (80.3 kg), SpO2 98 %.  Subjective: Bianca [name] presents today for her routine 4 week follow-up visit status post CABG. Overall she is doing quite well. She does still have some shortness of breath when walking. She has not had to take any pain medication.  Objective  Cor: sinus bradycardia Pulm: CTA bilaterally in all fields Abd: no masses or tenderness Ext: No edema Wound: c/d/i without drainage   CLINICAL DATA:  CABG 1 month ago / no chest complaints  EXAM: CHEST  2 VIEW  COMPARISON:  06/02/2017  FINDINGS: Cardiac silhouette is top-normal in size. No mediastinal widening. No mediastinal or hilar masses or evidence of adenopathy. Changes from CABG surgery appear stable from the prior exam.  Lungs are clear.  No pleural effusion.  No pneumothorax.  Skeletal structures are intact.  IMPRESSION: 1. No acute cardiopulmonary disease. 2. Left lung base atelectasis and pleural fluid have resolved since the prior study.   Electronically Signed   By: Lajean Manes M.D.   On: 06/29/2017 13:42  Assessment & Plan  Bianca [name] presents today for her routine follow-up visit. Overall she is doing quite well. She is worried about some shortness of breath when walking. I assured her that this is normal and should be getting better over the next couple weeks. I reviewed her chest x-ray with her and her daughter which showed no acute pulmonary disease. She did have some left  lung base atelectasis. Per her daughter she has been off of her Ambien at night. I discussed using Tylenol PM or perhaps melatonin if sleep continues to be an issue. She did have some previous pain medication abuse issues which is why she is not taking any pain medicine. She does take gabapentin for spinal surgery residual pain down her left leg. She does have some sinus bradycardia today which has been consistent since her day of discharge. Her cardiologist to occur off of her Lopressor, therefore she is currently only taking amiodarone 200 mg once a day. I will leave this up to cardiology to discontinue.  She does have some nasal drainage that is hard to cough up. I suggested over-the-counter Mucinex, the plain Mucinex for mucolytic. I continued to encourage her to use her heart patella last support and cough instead of swallowing her phlegm. She has been swallowing her phlegm which has been creating some nausea. She is still able to eat. If this continues to be problem she'll need to follow-up with her primary care provider. Overall, I think she is progressing well and her incisions all are clean, dry, and intact without drainage. She will need a referral for cardiac rehabilitation if cardiology has not already placed a referral. She'll not need to follow-up again with our office unless needed. She can feel free to call us at any time if problems arise.   Elgie Collard 06/29/2017

## 2017-07-02 ENCOUNTER — Ambulatory Visit: Payer: 59 | Admitting: Cardiothoracic Surgery

## 2017-07-02 ENCOUNTER — Encounter: Payer: 59 | Admitting: Psychology

## 2017-07-07 ENCOUNTER — Telehealth (HOSPITAL_COMMUNITY): Payer: Self-pay

## 2017-07-07 NOTE — Telephone Encounter (Signed)
Patient has not returned phone calls or responded to letter that was sent. Closed referral.

## 2017-07-16 ENCOUNTER — Other Ambulatory Visit: Payer: Self-pay | Admitting: Adult Health

## 2017-07-16 NOTE — Telephone Encounter (Signed)
Ok to refill for 30 days  

## 2017-07-16 NOTE — Telephone Encounter (Signed)
Last filled on 05/12/17 #30

## 2017-07-17 ENCOUNTER — Telehealth: Payer: Self-pay | Admitting: Adult Health

## 2017-07-17 NOTE — Telephone Encounter (Signed)
Copied from [place] [phone]. Topic: Quick Communication - See Telephone Encounter >> Jul 17, 2017  4:29 PM Cleaster Corin, NT wrote: CRM for notification. See Telephone encounter for:   07/17/17. Pt wants to know if she can get a rx. For ambien pt. Called pharamcy and was told to call office to get rx.   [id number] [place], [place] - [address] [place] [address] Phone: [phone] Fax: [phone] Not a 24 hour pharmacy; exact hours not known

## 2017-07-17 NOTE — Telephone Encounter (Signed)
CALLED TO THE PHARMACY AND LEFT ON MACHINE

## 2017-07-17 NOTE — Telephone Encounter (Signed)
This was left on the machine earlier today.  Pharmacy will find when they check their machine.

## 2017-07-21 ENCOUNTER — Encounter: Payer: 59 | Admitting: Psychology

## 2017-08-09 ENCOUNTER — Other Ambulatory Visit: Payer: Self-pay | Admitting: Physician Assistant

## 2017-08-12 ENCOUNTER — Telehealth: Payer: Self-pay | Admitting: Interventional Cardiology

## 2017-08-12 NOTE — Telephone Encounter (Signed)
New message  Pt c/o Shortness Of Breath: STAT if SOB developed within the last 24 hours or pt is noticeably SOB on the phone  1. Are you currently SOB (can you hear that pt is SOB on the phone)? No   2. How long have you been experiencing SOB? Per pt off and on since open heart procedure in october  3. Are you SOB when sitting or when up moving around? Moving around   4. Are you currently experiencing any other symptoms? Coughing   Pt wanted to make a sooner appt was offer for 1/4 with PA. Pt states she will wait until appt.

## 2017-08-12 NOTE — Telephone Encounter (Signed)
Spoke with patient. Patient c/o dry cough mostly at night right before going to bed and stops once she lays down and short of breath with walking up stairs or walking from one room to the next. Patient states symptoms started a few days ago patient denies any chest pain, lightheadedness, dizziness and states BP has been good. I recommended that patient follow up with PCP and keep appt with Dr. Tamala Julian on 08/28/17, patient refused earlier appt with PA. I informed her that I will forward to Dr. Tamala Julian for further recommendation and will give her a call back if needed. Patient in agreement with plan, verbalized understanding and thanked me for the call.

## 2017-08-13 NOTE — Telephone Encounter (Signed)
Thanks

## 2017-08-13 NOTE — Telephone Encounter (Signed)
Set f/u with me in January

## 2017-08-13 NOTE — Telephone Encounter (Signed)
This is actually a pt of Dr. Harrington Challenger.  Will forward to Dr. Harrington Challenger for review and advisement.

## 2017-08-20 NOTE — Telephone Encounter (Signed)
Called patient to schedule with Dr. Harrington Challenger. She has requested to change providers.  She was a patient of Dr. Thompson Caul at one point in the past, but her insurance changed and so she needed to change doctors and now prefers to switch back.  I sent a staff message to both providers to inform.   Pt currently has a 6-8 wk f/u appointment with Dr. Tamala Julian (on 08/28/17) made after seeing APP.

## 2017-08-21 ENCOUNTER — Telehealth: Payer: Self-pay | Admitting: Interventional Cardiology

## 2017-08-21 NOTE — Telephone Encounter (Signed)
Bianca [name] was prescribe Hydrocodone for her back and wants to know if its okay for her to take . Please call

## 2017-08-21 NOTE — Telephone Encounter (Signed)
Reviewed with Fuller Canada, PharmD and should be OK to take. Had confusion/hallucinations when taking Oxycodone and should watch for these possible side effects.  I spoke with pt and gave her this information.

## 2017-08-28 ENCOUNTER — Ambulatory Visit: Payer: BLUE CROSS/BLUE SHIELD | Admitting: Interventional Cardiology

## 2017-08-28 ENCOUNTER — Encounter: Payer: Self-pay | Admitting: Interventional Cardiology

## 2017-08-28 VITALS — BP 126/70 | HR 66 | Ht 61.0 in | Wt 180.4 lb

## 2017-08-28 DIAGNOSIS — R42 Dizziness and giddiness: Secondary | ICD-10-CM

## 2017-08-28 DIAGNOSIS — F17210 Nicotine dependence, cigarettes, uncomplicated: Secondary | ICD-10-CM | POA: Diagnosis not present

## 2017-08-28 DIAGNOSIS — E785 Hyperlipidemia, unspecified: Secondary | ICD-10-CM

## 2017-08-28 DIAGNOSIS — I480 Paroxysmal atrial fibrillation: Secondary | ICD-10-CM

## 2017-08-28 DIAGNOSIS — G8929 Other chronic pain: Secondary | ICD-10-CM

## 2017-08-28 DIAGNOSIS — I25708 Atherosclerosis of coronary artery bypass graft(s), unspecified, with other forms of angina pectoris: Secondary | ICD-10-CM | POA: Diagnosis not present

## 2017-08-28 DIAGNOSIS — I4891 Unspecified atrial fibrillation: Secondary | ICD-10-CM | POA: Diagnosis not present

## 2017-08-28 DIAGNOSIS — I25709 Atherosclerosis of coronary artery bypass graft(s), unspecified, with unspecified angina pectoris: Secondary | ICD-10-CM | POA: Insufficient documentation

## 2017-08-28 DIAGNOSIS — Z888 Allergy status to other drugs, medicaments and biological substances status: Secondary | ICD-10-CM | POA: Diagnosis not present

## 2017-08-28 DIAGNOSIS — I9789 Other postprocedural complications and disorders of the circulatory system, not elsewhere classified: Secondary | ICD-10-CM | POA: Insufficient documentation

## 2017-08-28 DIAGNOSIS — M549 Dorsalgia, unspecified: Secondary | ICD-10-CM | POA: Diagnosis not present

## 2017-08-28 MED ORDER — CLOPIDOGREL BISULFATE 75 MG PO TABS: 75.0000 mg | ORAL_TABLET | Freq: Every day | ORAL | 3 refills | Status: DC

## 2017-08-28 MED ORDER — MECLIZINE HCL 25 MG PO TABS: 25.0000 mg | ORAL_TABLET | Freq: Two times a day (BID) | ORAL | 0 refills | Status: DC

## 2017-08-28 NOTE — Progress Notes (Signed)
Cardiology Office Note    Date:  08/28/2017   ID:  CASSIDIE VEIGA, DOB 1945/09/10, MRN [id number]  PCP:  [name], NP  Cardiologist: Sinclair Grooms, MD   Chief Complaint  Patient presents with  . Atrial Fibrillation  . Coronary Artery Disease    History of Present Illness:  Bianca [name] is a 72 y.o. female coronary artery disease, prior inferior MI 1997, prior right coronary drug-eluting stents 2007, non-ST elevation MI 2018, and ultimate coronary bypass grafting with LIMA to LAD, SVG to obtuse marginal, and SVG to PDA October 2018.  Amiodarone therapy was used for postoperative atrial fibrillation.  Malachy Mood is doing relatively well.  She had postoperative atrial fibrillation after coronary bypass grafting and was started on amiodarone therapy.  She continues to have significant back discomfort.  She had opiate dependency when admitted to the hospital because of the back pain.  Presentation was acute coronary syndrome she had emergency surgery on the day that she underwent heart catheterization.  She has had no recurrence of angina since his coronary bypass grafting.  Still has musculoskeletal chest pain.  Past Medical History:  Diagnosis Date  . Arthritis   . Blood transfusion without reported diagnosis   . CAD (coronary artery disease)    a. 1997 MI/PCI RCA;  b. 2007 MI/PCI of 100% RCA with Taxus DES x 3 placed, EF 55%;  c. 2010 Cath: stable anatomy;  d. 05/2012 NSTEMI/Cath/PCI: LM nl, LAD 60-40m, D1 20ost, LCX 51m, RCA 40-44m ISR, 60/95d (Treated w/ 2.75x33 Xience Xpedition DES), PDA 64m (Treated w/ PTCA), EF 60%.  . Carpal tunnel syndrome on both sides   . Chronic lower back pain   . Depression   . GERD (gastroesophageal reflux disease)   . Hyperlipidemia   . Myocardial infarction [facility]) 1997, 2007, 2013  . Numbness of foot    Left foot - back surgery 2009    Past Surgical History:  Procedure Laterality Date  . ANKLE SURGERY  Years ago   Left; "had to take out a  floater"  . CARPAL TUNNEL RELEASE Left 10/12016  . CESAREAN SECTION  1990  . CORONARY ANGIOPLASTY WITH STENT PLACEMENT  1997; 2007, 2013   "2 + 2; + 1= total of 5  . CORONARY ARTERY BYPASS GRAFT N/A 05/26/2017   Procedure: CORONARY ARTERY BYPASS GRAFTING (CABG), ON PUMP, TIMES Three, using left internal mammary artery and right greater saphenous vein harvested endoscopically;  Surgeon: [name], MD;  Location: [facility];  Service: Open Heart Surgery;  Laterality: N/A;  LIMA to LAD, SVG to OM 1, SVG to PDA  . LEFT HEART CATH AND CORONARY ANGIOGRAPHY N/A 05/26/2017   Procedure: LEFT HEART CATH AND CORONARY ANGIOGRAPHY;  Surgeon: Belva Crome, MD;  Location: [facility];  Service: Cardiovascular;  Laterality: N/A;  . LEFT HEART CATHETERIZATION WITH CORONARY ANGIOGRAM N/A 05/18/2012   Procedure: LEFT HEART CATHETERIZATION WITH CORONARY ANGIOGRAM;  Surgeon: Wellington Hampshire, MD;  Location: [facility] CATH [facility];  Service: Cardiovascular;  Laterality: N/A;  . [facility]; 2009  . TEE WITHOUT CARDIOVERSION N/A 05/26/2017   Procedure: TRANSESOPHAGEAL ECHOCARDIOGRAM (TEE);  Surgeon: [name], MD;  Location: [facility];  Service: Open Heart Surgery;  Laterality: N/A;  . TOTAL HIP ARTHROPLASTY Right 05/24/2014   Procedure: RIGHT TOTAL HIP ARTHROPLASTY ANTERIOR APPROACH;  Surgeon: Gearlean Alf, MD;  Location: [facility];  Service: Orthopedics;  Laterality: Right;    Current  Medications: Outpatient Medications Prior to Visit  Medication Sig Dispense Refill  . atorvastatin (LIPITOR) 80 MG tablet TAKE 1 TABLET BY MOUTH EVERY DAY AT 6PM 90 tablet 3  . dexlansoprazole (DEXILANT) 60 MG capsule Take 1 capsule (60 mg total) by mouth daily. 30 capsule 2  . gabapentin (NEURONTIN) 300 MG capsule Take 300 mg by mouth at bedtime.     . hydrOXYzine (ATARAX/VISTARIL) 25 MG tablet Take 25 mg by mouth at bedtime.    . Methen-Hyosc-Meth Blue-Na Phos (ME/NAPHOS/MB/HYO1 PO) Take 1 tablet by  mouth every 6 (six) hours as needed (vaginal burning).     . Multiple Vitamin (MULTIVITAMIN WITH MINERALS) TABS tablet Take 1 tablet by mouth daily.    . pentosan polysulfate (ELMIRON) 100 MG capsule Take 200 mg by mouth 2 (two) times daily. For pain    . polyvinyl alcohol (ARTIFICIAL TEARS) 1.4 % ophthalmic solution Place 1 drop into both eyes 2 (two) times daily as needed for dry eyes.    Marland Kitchen amiodarone (PACERONE) 200 MG tablet TAKE 1 TABLET BY MOUTH EVERY DAY 90 tablet 3  . meclizine (ANTIVERT) 25 MG tablet Take 1 tablet (25 mg total) by mouth 2 (two) times daily. 30 tablet 0  . acetaminophen (TYLENOL) 325 MG tablet Take 2 tablets (650 mg total) by mouth every 6 (six) hours as needed for fever, headache, mild pain or moderate pain. (Patient not taking: Reported on 08/28/2017)    . baclofen (LIORESAL) 10 MG tablet Take 10 mg by mouth at bedtime.  0  . buPROPion (WELLBUTRIN XL) 150 MG 24 hr tablet Take 150 mg by mouth daily.    . cyanocobalamin (,VITAMIN B-12,) 1000 MCG/ML injection Inject 1,000 mcg into the muscle every 30 (thirty) days. Vitamin B12   - last injection 05/22/17    . escitalopram (LEXAPRO) 20 MG tablet Take 20 mg by mouth daily.  1  . famotidine (PEPCID) 20 MG tablet Take 1 tablet (20 mg total) by mouth at bedtime. 1-2 hours before bed (Patient not taking: Reported on 08/28/2017)    . fluticasone (FLONASE) 50 MCG/ACT nasal spray Place 2 sprays into both nostrils daily as needed for allergies or rhinitis.    . fluticasone (FLOVENT HFA) 220 MCG/ACT inhaler Inhale 1 puff into the lungs 2 (two) times daily. (Patient not taking: Reported on 08/28/2017) 1 Inhaler 3  . PREMARIN vaginal cream USE 1/2 GRAM VAGINALLY EVERY NIGHT AT BEDTIME FOR THE 1ST 2 WEEKS,, THEN 1/2 GRAM 2-3 TIMES A WEEK (Patient not taking: Reported on 08/28/2017) 30 g 1  . Probiotic Product (PROBIOTIC PO) Take 1 capsule by mouth at bedtime. Nature Bounty's brand    . Red Yeast Rice Extract (RED YEAST RICE PO) Take 1 capsule by  mouth at bedtime.    Marland Kitchen zolpidem (AMBIEN) 5 MG tablet TAKE 1 TABLET BY MOUTH AT BEDTIME (Patient not taking: Reported on 08/28/2017) 30 tablet 0  . clopidogrel (PLAVIX) 75 MG tablet Take 1 tablet (75 mg total) by mouth daily. (Patient not taking: Reported on 08/28/2017) 90 tablet 3   No facility-administered medications prior to visit.      Allergies:   Oxycodone hcl; Penicillins; Macrobid [nitrofurantoin monohyd macro]; Sulfa antibiotics; Tramadol; and Aspirin   Social History   Socioeconomic History  . Marital status: Married    Spouse name: None  . Number of children: None  . Years of education: None  . Highest education level: None  Social Needs  . Financial resource strain: None  . Food  insecurity - worry: None  . Food insecurity - inability: None  . Transportation needs - medical: None  . Transportation needs - non-medical: None  Occupational History  . None  Tobacco Use  . Smoking status: Former Smoker    Packs/day: 0.50    Years: 50.00    Pack years: 25.00    Types: Cigarettes    Last attempt to quit: 06/01/2017    Years since quitting: 0.2  . Smokeless tobacco: Never Used  Substance and Sexual Activity  . Alcohol use: No    Alcohol/week: 0.0 oz  . Drug use: No  . Sexual activity: Not Currently    Birth control/protection: Post-menopausal  Other Topics Concern  . None  Social History Narrative   Is a homemaker   Married for 30 years    Has a daughter and son    Pets: Two dogs and a Neurosurgeon   Likes to garden ( flower beds).         Family History:  The patient's family history includes Cancer in her paternal grandfather; Coronary artery disease in her mother; Dementia in her father; Diabetes in her mother; Hypertension in her mother; Sudden death in her brother.   ROS:   Please see the history of present illness.    Troubled by vertigo that is chronically treated with meclizine.  She has chronic back discomfort for which she is not taking any medication  currently.  She had been previously using Dilaudid.  She intermittently uses ibuprofen.  She has history of depression but is not on any therapy currently.  She has occasional cough.  She has had weight gain since surgery.  She denies orthopnea and PND.  She has pain in both legs that she believes is related to her back. All other systems reviewed and are negative.   PHYSICAL EXAM:   VS:  BP 126/70   Pulse 66   Ht 5\' 1"  (1.549 m)   Wt 180 lb 6.4 oz (81.8 kg)   LMP  (LMP Unknown)   BMI 34.09 kg/m    GEN: Well nourished, well developed, in no acute distress  HEENT: normal  Neck: no JVD, carotid bruits, or masses Cardiac: RRR; no murmurs, rubs, or gallops,no edema  Respiratory:  clear to auscultation bilaterally, normal work of breathing GI: soft, nontender, nondistended, + BS MS: no deformity or atrophy  Skin: warm and dry, no rash Neuro:  Alert and Oriented x 3, Strength and sensation are intact Psych: euthymic mood, full affect  Wt Readings from Last 3 Encounters:  08/28/17 180 lb 6.4 oz (81.8 kg)  06/29/17 177 lb (80.3 kg)  06/26/17 177 lb 12.8 oz (80.6 kg)      Studies/Labs Reviewed:   EKG:  EKG not repeated  Recent Labs: 01/02/2017: ALT 25 05/08/2017: NT-Pro BNP 226 05/28/2017: TSH 5.685 06/02/2017: Hemoglobin 9.0; Magnesium 1.9; Platelets 276 06/17/2017: BUN 10; Creatinine, Ser 1.06; Potassium 4.9; Sodium 141   Lipid Panel    Component Value Date/Time   CHOL 233 (H) 10/31/2016 0915   TRIG 316.0 (H) 10/31/2016 0915   HDL 48.80 10/31/2016 0915   CHOLHDL 5 10/31/2016 0915   VLDL 63.2 (H) 10/31/2016 0915   LDLCALC 106 (H) 01/03/2014 0939   LDLDIRECT 129.0 10/31/2016 0915    Additional studies/ records that were reviewed today include:  No new data    ASSESSMENT:    1. Coronary artery disease of bypass graft of native heart with stable angina pectoris (HCC)   2. Paroxysmal  atrial fibrillation ([place])   3. Hyperlipidemia with target LDL less than 70   4.  Cigarette smoker   5. Dizziness   6. Other chronic back pain   7. Aspirin allergy   8. Postoperative atrial fibrillation (HCC)      PLAN:  In order of problems listed above:  1. Status post coronary bypass surgery without recurrent angina.  CABG was complicated by atrial fibrillation.  She is allergic to aspirin therefore clopidogrel was being used as an antiplatelet to prevent a bypass graft occlusion and acute native coronary events. 2. Discontinue amiodarone.  Check EKG on return visit in 2 months. 3. LDL target should be less than 70.  Need to check lipid panel and liver panel on return in 2 months. 4. Not currently smoking. 5. Likely vertigo since he responds to meclizine.  She has been on this medication chronically.  We refilled the prescription today. 6. She needs to reestablish with primary care and possibly a physiatrist for control of back pain. 7. Clopidogrel is being used in the presence of aspirin allergy.  She has been advised to discontinue hydroxyzine.  She should establish with primary care and also orthosis/physiatry.  Discontinue amiodarone.  Resume clopidogrel 75 mg/day.  Clinical follow-up in 2 months with EKG.    Medication Adjustments/Labs and Tests Ordered: Current medicines are reviewed at length with the patient today.  Concerns regarding medicines are outlined above.  Medication changes, Labs and Tests ordered today are listed in the Patient Instructions below. Patient Instructions  Medication Instructions:  1) RESTART Plavix 75mg  once daily 2) DISCONTINUE Amiodarone 3) We have sent in a one time prescription for your Meclizine.  You will need to get further refills from your Primary Care Physician.   Labwork: None  Testing/Procedures: None  Follow-Up: Your physician recommends that you schedule a follow-up appointment in: 2 months with Dr. Tamala Julian with an EKG.    Any Other Special Instructions Will Be Listed Below (If Applicable).  You have been  referred to Phase 2 Cardiac Rehab.    If you need a refill on your cardiac medications before your next appointment, please call your pharmacy.      Signed, Sinclair Grooms, MD  08/28/2017 5:17 PM    [facility] [place], [place], [place]  [address] Phone: [phone]; Fax: [phone]

## 2017-08-28 NOTE — Patient Instructions (Signed)
Medication Instructions:  1) RESTART Plavix 75mg  once daily 2) DISCONTINUE Amiodarone 3) We have sent in a one time prescription for your Meclizine.  You will need to get further refills from your Primary Care Physician.   Labwork: None  Testing/Procedures: None  Follow-Up: Your physician recommends that you schedule a follow-up appointment in: 2 months with Dr. Tamala Julian with an EKG.    Any Other Special Instructions Will Be Listed Below (If Applicable).  You have been referred to Phase 2 Cardiac Rehab.    If you need a refill on your cardiac medications before your next appointment, please call your pharmacy.

## 2017-09-04 ENCOUNTER — Telehealth (HOSPITAL_COMMUNITY): Payer: Self-pay

## 2017-09-04 NOTE — Telephone Encounter (Signed)
Tried to call and speak with patient in regards to insurance - lm on vm.

## 2017-09-07 ENCOUNTER — Telehealth (HOSPITAL_COMMUNITY): Payer: Self-pay

## 2017-09-07 DIAGNOSIS — F332 Major depressive disorder, recurrent severe without psychotic features: Secondary | ICD-10-CM | POA: Diagnosis not present

## 2017-09-07 NOTE — Telephone Encounter (Signed)
Called and spoke with patient in regards to cardiac rehab - patient is interested in the program. Scheduled orientation on 10/13/2017 at 8:15am. Patient will attend the 1:15pm exc class.

## 2017-10-05 DIAGNOSIS — F332 Major depressive disorder, recurrent severe without psychotic features: Secondary | ICD-10-CM | POA: Diagnosis not present

## 2017-10-09 ENCOUNTER — Telehealth (HOSPITAL_COMMUNITY): Payer: Self-pay

## 2017-10-12 ENCOUNTER — Telehealth (HOSPITAL_COMMUNITY): Payer: Self-pay

## 2017-10-12 NOTE — Telephone Encounter (Signed)
Cardiac Rehab Medication Review by a Pharmacist  Does the patient  feel that his/her medications are working for him/her?  Yes - besides her pain medications which she states do not help her back pain.  Has the patient been experiencing any side effects to the medications prescribed?  Yes - dry mouth.  Does the patient measure his/her own blood pressure or blood glucose at home?  no    Does the patient have any problems obtaining medications due to transportation or finances?   no  Understanding of regimen: fair Understanding of indications: fair Potential of compliance: fair  Pharmacist comments: Ms. Aristizabal is a pleasant 29 yoF who is looking forward to beginning cardiac rehab. There were many medications changes for her in the past. Her medication list has been updated.   Of note: -She is not taking her baclofen as prescribed - she states that she does not know who prescribed it and she needs it to be refilled.  -Her bupropion was increased to 300 mg daily, and her Lexapro was discontinued.  -She has not taken her Vitamin B12 injections since October when her surgery was scheduled, she plans on getting back on them as soon as possible.  -She takes her Dexilant as needed, and feels like she has not needed it recently as her stomach acid has seemed under control.  -She no longer takes her red yeast rice and gabapentin as prescribed, she only takes it when she remembers. -Her Ambien was discontinued in the hospital and she was transitioned to trazodone for sleep.     Alexandra Lipps L. Kyung Rudd, PharmD, [place] PGY1 Pharmacy Resident Pager: [phone]

## 2017-10-13 ENCOUNTER — Encounter (HOSPITAL_COMMUNITY): Payer: Self-pay

## 2017-10-13 ENCOUNTER — Encounter (HOSPITAL_COMMUNITY)
Admission: RE | Admit: 2017-10-13 | Discharge: 2017-10-13 | Disposition: A | Discharge status: Home / Self Care | Payer: BLUE CROSS/BLUE SHIELD | Source: Ambulatory Visit | Attending: Internal Medicine | Admitting: Internal Medicine

## 2017-10-13 VITALS — Ht 62.0 in | Wt 185.2 lb

## 2017-10-13 DIAGNOSIS — I2510 Atherosclerotic heart disease of native coronary artery without angina pectoris: Secondary | ICD-10-CM | POA: Diagnosis not present

## 2017-10-13 DIAGNOSIS — K219 Gastro-esophageal reflux disease without esophagitis: Secondary | ICD-10-CM | POA: Insufficient documentation

## 2017-10-13 DIAGNOSIS — Z7902 Long term (current) use of antithrombotics/antiplatelets: Secondary | ICD-10-CM | POA: Diagnosis not present

## 2017-10-13 DIAGNOSIS — M1990 Unspecified osteoarthritis, unspecified site: Secondary | ICD-10-CM | POA: Diagnosis not present

## 2017-10-13 DIAGNOSIS — F329 Major depressive disorder, single episode, unspecified: Secondary | ICD-10-CM | POA: Diagnosis not present

## 2017-10-13 DIAGNOSIS — Z951 Presence of aortocoronary bypass graft: Secondary | ICD-10-CM | POA: Diagnosis not present

## 2017-10-13 DIAGNOSIS — I252 Old myocardial infarction: Secondary | ICD-10-CM | POA: Insufficient documentation

## 2017-10-13 DIAGNOSIS — Z955 Presence of coronary angioplasty implant and graft: Secondary | ICD-10-CM | POA: Insufficient documentation

## 2017-10-13 DIAGNOSIS — Z79899 Other long term (current) drug therapy: Secondary | ICD-10-CM | POA: Diagnosis not present

## 2017-10-13 DIAGNOSIS — Z87891 Personal history of nicotine dependence: Secondary | ICD-10-CM | POA: Insufficient documentation

## 2017-10-13 DIAGNOSIS — E785 Hyperlipidemia, unspecified: Secondary | ICD-10-CM | POA: Diagnosis not present

## 2017-10-13 NOTE — Progress Notes (Signed)
Cardiac Individual Treatment Plan  Patient Details  Name: Bianca [name] MRN: [id number] Date of Birth: [birth year] Referring Provider:     CARDIAC REHAB PHASE II ORIENTATION from 10/13/2017 in [place]  Referring Provider  Dorris Carnes MD      Initial Encounter Date:    CARDIAC REHAB PHASE II ORIENTATION from 10/13/2017 in [place]  Date  10/13/17  Referring Provider  Dorris Carnes MD      Visit Diagnosis: S/P CABG x 3  Patient's Home Medications on Admission:  Current Outpatient Medications:  .  acetaminophen (TYLENOL) 325 MG tablet, Take 2 tablets (650 mg total) by mouth every 6 (six) hours as needed for fever, headache, mild pain or moderate pain., Disp: , Rfl:  .  atorvastatin (LIPITOR) 80 MG tablet, TAKE 1 TABLET BY MOUTH EVERY DAY AT 6PM, Disp: 90 tablet, Rfl: 3 .  baclofen (LIORESAL) 10 MG tablet, Take 10 mg by mouth at bedtime., Disp: , Rfl: 0 .  buPROPion (WELLBUTRIN XL) 300 MG 24 hr tablet, Take 300 mg by mouth daily., Disp: , Rfl: 1 .  clopidogrel (PLAVIX) 75 MG tablet, Take 1 tablet (75 mg total) by mouth daily., Disp: 90 tablet, Rfl: 3 .  cyanocobalamin (,VITAMIN B-12,) 1000 MCG/ML injection, Inject 1,000 mcg into the muscle every 30 (thirty) days. Vitamin B12   - last injection 05/22/17, Disp: , Rfl:  .  dexlansoprazole (DEXILANT) 60 MG capsule, Take 1 capsule (60 mg total) by mouth daily., Disp: 30 capsule, Rfl: 2 .  gabapentin (NEURONTIN) 300 MG capsule, Take 300 mg by mouth at bedtime. , Disp: , Rfl:  .  hydrOXYzine (ATARAX/VISTARIL) 25 MG tablet, Take 25 mg by mouth at bedtime., Disp: , Rfl:  .  meclizine (ANTIVERT) 25 MG tablet, Take 1 tablet (25 mg total) by mouth 2 (two) times daily., Disp: 40 tablet, Rfl: 0 .  Methen-Hyosc-Meth Blue-Na Phos (ME/NAPHOS/MB/HYO1) 81.6 MG TABS, Take 1 tablet by mouth every 6 (six) hours as needed., Disp: , Rfl: 9 .  Multiple Vitamin (MULTIVITAMIN WITH MINERALS) TABS  tablet, Take 1 tablet by mouth daily., Disp: , Rfl:  .  Multiple Vitamins-Minerals (MACULAR HEALTH FORMULA) CAPS, Take 1 capsule by mouth daily., Disp: , Rfl:  .  pentosan polysulfate (ELMIRON) 100 MG capsule, Take 200 mg by mouth 2 (two) times daily., Disp: , Rfl:  .  polyvinyl alcohol (ARTIFICIAL TEARS) 1.4 % ophthalmic solution, Place 1 drop into both eyes 2 (two) times daily as needed for dry eyes., Disp: , Rfl:  .  Probiotic Product (PROBIOTIC PO), Take 1 capsule by mouth at bedtime. [employer] brand, Disp: , Rfl:  .  Red Yeast Rice Extract (RED YEAST RICE PO), Take 1 capsule by mouth at bedtime., Disp: , Rfl:  .  traZODone (DESYREL) 50 MG tablet, Take 50 mg by mouth at bedtime., Disp: , Rfl: 0  Past Medical History: Past Medical History:  Diagnosis Date  . Arthritis   . Blood transfusion without reported diagnosis   . CAD (coronary artery disease)    a. 1997 MI/PCI RCA;  b. 2007 MI/PCI of 100% RCA with Taxus DES x 3 placed, EF 55%;  c. 2010 Cath: stable anatomy;  d. 05/2012 NSTEMI/Cath/PCI: LM nl, LAD 60-65m, D1 20ost, LCX 59m, RCA 40-63m ISR, 60/95d (Treated w/ 2.75x33 Xience Xpedition DES), PDA 42m (Treated w/ PTCA), EF 60%.  . Carpal tunnel syndrome on both sides   . Chronic lower back pain   .  Depression   . GERD (gastroesophageal reflux disease)   . Hyperlipidemia   . Myocardial infarction [facility]) 1997, 2007, 2013  . Numbness of foot    Left foot - back surgery 2009    Tobacco Use: Social History   Tobacco Use  Smoking Status Former Smoker  . Packs/day: 0.50  . Years: 50.00  . Pack years: 25.00  . Types: Cigarettes  . Last attempt to quit: 06/01/2017  . Years since quitting: 0.3  Smokeless Tobacco Never Used    Labs: Recent Review Flowsheet Data    Labs for ITP Cardiac and Pulmonary Rehab Latest Ref Rng & Units 05/26/2017 05/27/2017 05/27/2017 05/27/2017 05/27/2017   Cholestrol 0 - 200 mg/dL - - - - -   LDLCALC 0 - 99 mg/dL - - - - -   LDLDIRECT mg/dL - - - -  -   HDL >39.00 mg/dL - - - - -   Trlycerides 0.0 - 149.0 mg/dL - - - - -   Hemoglobin A1c 4.6 - 6.5 % - - - - -   PHART 7.350 - 7.450 7.418 7.215(L) 7.307(L) 7.253(L) -   PCO2ART 32.0 - 48.0 mmHg 37.6 47.9 46.2 50.8(H) -   HCO3 20.0 - 28.0 mmol/L 24.2 19.4(L) 22.9 22.3 -   TCO2 22 - 32 mmol/L 25 21(L) 24 24 24    ACIDBASEDEF 0.0 - 2.0 mmol/L - 8.0(H) 3.0(H) 5.0(H) -   O2SAT % 100.0 96.0 99.0 97.0 -      Capillary Blood Glucose: Lab Results  Component Value Date   GLUCAP 107 (H) 06/03/2017   GLUCAP 105 (H) 06/02/2017   GLUCAP 110 (H) 06/01/2017   GLUCAP 122 (H) 06/01/2017   GLUCAP 112 (H) 06/01/2017     Exercise Target Goals: Date: 10/13/17  Exercise Program Goal: Individual exercise prescription set using results from initial 6 min walk test and THRR while considering  patient's activity barriers and safety.   Exercise Prescription Goal: Initial exercise prescription builds to 30-45 minutes a day of aerobic activity, 2-3 days per week.  Home exercise guidelines will be given to patient during program as part of exercise prescription that the participant will acknowledge.  Activity Barriers & Risk Stratification: Activity Barriers & Cardiac Risk Stratification - 10/13/17 0903      Activity Barriers & Cardiac Risk Stratification   Activity Barriers  Joint Problems;Deconditioning;Muscular Weakness;Back Problems;Arthritis;Right Hip Replacement;Shortness of Breath;Balance Concerns    Cardiac Risk Stratification  High       6 Minute Walk: 6 Minute Walk    Row Name 10/13/17 1152 10/13/17 1218 10/13/17 1237     6 Minute Walk   Phase  Initial  -  Initial   Distance  907 feet  -  907 feet   Walk Time  6 minutes  4.47 minutes  4.47 minutes   # of Rest Breaks  0  2  2   MPH  1.7  2.3  2.3   METS  1.6  1.7  1.7   RPE  11  11  11    Perceived Dyspnea   1  1  -   VO2 Peak  5.7  5.9  2.9   Symptoms  Yes (comment)  Yes (comment)  Yes (comment)   Comments  pt took 2 short rest  breaks, 1st break at 43 seconds and 2nd break at 30 seconds  pt took 2 short rest breaks, 1st break at 43 seconds and 2nd break at 30 seconds; mild SOB; dry mouth  pt took 2 short rest breaks, 1st break at 43 seconds and 2nd break at 30 seconds; mild SOB; dry mouth   Resting HR  64 bpm  -  64 bpm   Resting BP  102/64  -  102/64   Resting Oxygen Saturation   91 %  -  91 %   Exercise Oxygen Saturation  during 6 min walk  93 %  -  93 %   Max Ex. HR  83 bpm  -  83 bpm   Max Ex. BP  148/60  -  148/60   2 Minute Post BP  118/70  -  118/70      Oxygen Initial Assessment:   Oxygen Re-Evaluation:   Oxygen Discharge (Final Oxygen Re-Evaluation):   Initial Exercise Prescription: Initial Exercise Prescription - 10/13/17 1200      Date of Initial Exercise RX and Referring Provider   Date  10/13/17    Referring Provider  Dorris Carnes MD      Recumbant Bike   Level  1    Minutes  15    METs  1      NuStep   Level  2    Minutes  15    METs  1.4      Track   Laps  8    Minutes  15    METs  2      Prescription Details   Frequency (times per week)  3    Duration  Progress to 30 minutes of continuous aerobic without signs/symptoms of physical distress      Intensity   THRR 40-80% of Max Heartrate  60-119    Ratings of Perceived Exertion  11-13    Perceived Dyspnea  0-4      Progression   Progression  Continue to progress workloads to maintain intensity without signs/symptoms of physical distress.      Resistance Training   Training Prescription  Yes    Weight  2lbs    Reps  10-15       Perform Capillary Blood Glucose checks as needed.  Exercise Prescription Changes:   Exercise Comments:   Exercise Goals and Review: Exercise Goals    Row Name 10/13/17 0903             Exercise Goals   Increase Physical Activity  Yes       Intervention  Provide advice, education, support and counseling about physical activity/exercise needs.;Develop an individualized exercise  prescription for aerobic and resistive training based on initial evaluation findings, risk stratification, comorbidities and participant's personal goals.       Expected Outcomes  Short Term: Attend rehab on a regular basis to increase amount of physical activity.;Long Term: Exercising regularly at least 3-5 days a week.;Long Term: Add in home exercise to make exercise part of routine and to increase amount of physical activity.       Increase Strength and Stamina  Yes       Intervention  Provide advice, education, support and counseling about physical activity/exercise needs.;Develop an individualized exercise prescription for aerobic and resistive training based on initial evaluation findings, risk stratification, comorbidities and participant's personal goals.       Expected Outcomes  Short Term: Increase workloads from initial exercise prescription for resistance, speed, and METs.;Short Term: Perform resistance training exercises routinely during rehab and add in resistance training at home;Long Term: Improve cardiorespiratory fitness, muscular endurance and strength as measured by increased METs and functional capacity (6MWT)  Able to understand and use rate of perceived exertion (RPE) scale  Yes       Intervention  Provide education and explanation on how to use RPE scale       Expected Outcomes  Short Term: Able to use RPE daily in rehab to express subjective intensity level;Long Term:  Able to use RPE to guide intensity level when exercising independently       Able to understand and use Dyspnea scale  Yes       Intervention  Provide education and explanation on how to use Dyspnea scale       Expected Outcomes  Short Term: Able to use Dyspnea scale daily in rehab to express subjective sense of shortness of breath during exertion;Long Term: Able to use Dyspnea scale to guide intensity level when exercising independently       Knowledge and understanding of Target Heart Rate Range (THRR)  Yes        Intervention  Provide education and explanation of THRR including how the numbers were predicted and where they are located for reference       Expected Outcomes  Short Term: Able to state/look up THRR;Long Term: Able to use THRR to govern intensity when exercising independently;Short Term: Able to use daily as guideline for intensity in rehab       Able to check pulse independently  Yes       Intervention  Provide education and demonstration on how to check pulse in carotid and radial arteries.;Review the importance of being able to check your own pulse for safety during independent exercise       Expected Outcomes  Short Term: Able to explain why pulse checking is important during independent exercise;Long Term: Able to check pulse independently and accurately       Understanding of Exercise Prescription  Yes       Intervention  Provide education, explanation, and written materials on patient's individual exercise prescription       Expected Outcomes  Short Term: Able to explain program exercise prescription;Long Term: Able to explain home exercise prescription to exercise independently          Exercise Goals Re-Evaluation :    Discharge Exercise Prescription (Final Exercise Prescription Changes):   Nutrition:  Target Goals: Understanding of nutrition guidelines, daily intake of sodium 1500mg , cholesterol 200mg , calories 30% from fat and 7% or less from saturated fats, daily to have 5 or more servings of fruits and vegetables.  Biometrics: Pre Biometrics - 10/13/17 1221      Pre Biometrics   Height  5\' 2"  (1.575 m)    Weight  185 lb 3 oz (84 kg)    Waist Circumference  39.75 inches    Hip Circumference  48 inches    Waist to Hip Ratio  0.83 %    BMI (Calculated)  33.86    Triceps Skinfold  23 mm    % Body Fat  43.6 %    Grip Strength  31 kg    Flexibility  0 in LBP!    Single Leg Stand  0 seconds        Nutrition Therapy Plan and Nutrition Goals: Nutrition Therapy &  Goals - 10/13/17 1207      Nutrition Therapy   Diet  Heart Healthy      Personal Nutrition Goals   Nutrition Goal  Wt loss of 1-2 lb/week to a wt loss goal of 6-24 lb at graduation from [place].  Intervention Plan   Intervention  Prescribe, educate and counsel regarding individualized specific dietary modifications aiming towards targeted core components such as weight, hypertension, lipid management, diabetes, heart failure and other comorbidities.    Expected Outcomes  Short Term Goal: Understand basic principles of dietary content, such as calories, fat, sodium, cholesterol and nutrients.;Long Term Goal: Adherence to prescribed nutrition plan.       Nutrition Assessments: Nutrition Assessments - 10/13/17 1207      MEDFICTS Scores   Pre Score  30       Nutrition Goals Re-Evaluation:   Nutrition Goals Re-Evaluation:   Nutrition Goals Discharge (Final Nutrition Goals Re-Evaluation):   Psychosocial: Target Goals: Acknowledge presence or absence of significant depression and/or stress, maximize coping skills, provide positive support system. Participant is able to verbalize types and ability to use techniques and skills needed for reducing stress and depression.  Initial Review & Psychosocial Screening: Initial Psych Review & Screening - 10/13/17 1152      Initial Review   Current issues with  None Identified      Family Dynamics   Good Support System?  Yes husband       Barriers   Psychosocial barriers to participate in program  The patient should benefit from training in stress management and relaxation.      Screening Interventions   Interventions  Encouraged to exercise    Expected Outcomes  Short Term goal: Identification and review with participant of any Quality of Life or Depression concerns found by scoring the questionnaire.;Long Term goal: The participant improves quality of Life and PHQ9 Scores as seen by post scores and/or verbalization of changes        Quality of Life Scores: Quality of Life - 10/13/17 1207      Quality of Life Scores   Health/Function Pre  18.17 %    Socioeconomic Pre  28.94 %    Psych/Spiritual Pre  27.93 %    Family Pre  14.4 %    GLOBAL Pre  22.04 %      Scores of 19 and below usually indicate a poorer quality of life in these areas.  A difference of  2-3 points is a clinically meaningful difference.  A difference of 2-3 points in the total score of the Quality of Life Index has been associated with significant improvement in overall quality of life, self-image, physical symptoms, and general health in studies assessing change in quality of life.  PHQ-9: Recent Review Flowsheet Data    Depression screen [facility] 2/9 01/24/2015 01/10/2014   Decreased Interest 1 0   Down, Depressed, Hopeless 1 0   PHQ - 2 Score 2 0   Altered sleeping 3 -   Tired, decreased energy 3 -   Change in appetite 0 -   Feeling bad or failure about yourself  0 -   Trouble concentrating 0 -   Moving slowly or fidgety/restless 0 -   Suicidal thoughts 0 -   PHQ-9 Score 8 -     Interpretation of Total Score  Total Score Depression Severity:  1-4 = Minimal depression, 5-9 = Mild depression, 10-14 = Moderate depression, 15-19 = Moderately severe depression, 20-27 = Severe depression   Psychosocial Evaluation and Intervention:   Psychosocial Re-Evaluation:   Psychosocial Discharge (Final Psychosocial Re-Evaluation):   Vocational Rehabilitation: Provide vocational rehab assistance to qualifying candidates.   Vocational Rehab Evaluation & Intervention: Vocational Rehab - 10/13/17 1237      Initial Vocational Rehab Evaluation & Intervention  Assessment shows need for Vocational Rehabilitation  No       Education: Education Goals: Education classes will be provided on a weekly basis, covering required topics. Participant will state understanding/return demonstration of topics presented.  Learning  Barriers/Preferences: Learning Barriers/Preferences - 10/13/17 2353      Learning Barriers/Preferences   Learning Barriers  Sight    Learning Preferences  Video;Pictoral;Verbal Instruction;Skilled Demonstration;Written Material       Education Topics: Count Your Pulse:  -Group instruction provided by verbal instruction, demonstration, patient participation and written materials to support subject.  Instructors address importance of being able to find your pulse and how to count your pulse when at home without a heart monitor.  Patients get hands on experience counting their pulse with staff help and individually.   Heart Attack, Angina, and Risk Factor Modification:  -Group instruction provided by verbal instruction, video, and written materials to support subject.  Instructors address signs and symptoms of angina and heart attacks.    Also discuss risk factors for heart disease and how to make changes to improve heart health risk factors.   Functional Fitness:  -Group instruction provided by verbal instruction, demonstration, patient participation, and written materials to support subject.  Instructors address safety measures for doing things around the house.  Discuss how to get up and down off the floor, how to pick things up properly, how to safely get out of a chair without assistance, and balance training.   Meditation and Mindfulness:  -Group instruction provided by verbal instruction, patient participation, and written materials to support subject.  Instructor addresses importance of mindfulness and meditation practice to help reduce stress and improve awareness.  Instructor also leads participants through a meditation exercise.    Stretching for Flexibility and Mobility:  -Group instruction provided by verbal instruction, patient participation, and written materials to support subject.  Instructors lead participants through series of stretches that are designed to increase  flexibility thus improving mobility.  These stretches are additional exercise for major muscle groups that are typically performed during regular warm up and cool down.   Hands Only CPR:  -Group verbal, video, and participation provides a basic overview of AHA guidelines for community CPR. Role-play of emergencies allow participants the opportunity to practice calling for help and chest compression technique with discussion of AED use.   Hypertension: -Group verbal and written instruction that provides a basic overview of hypertension including the most recent diagnostic guidelines, risk factor reduction with self-care instructions and medication management.    Nutrition I class: Heart Healthy Eating:  -Group instruction provided by PowerPoint slides, verbal discussion, and written materials to support subject matter. The instructor gives an explanation and review of the Therapeutic Lifestyle Changes diet recommendations, which includes a discussion on lipid goals, dietary fat, sodium, fiber, plant stanol/sterol esters, sugar, and the components of a well-balanced, healthy diet.   CARDIAC REHAB PHASE II ORIENTATION from 10/13/2017 in [place]  Date  10/13/17  Educator  RD      Nutrition II class: Lifestyle Skills:  -Group instruction provided by PowerPoint slides, verbal discussion, and written materials to support subject matter. The instructor gives an explanation and review of label reading, grocery shopping for heart health, heart healthy recipe modifications, and ways to make healthier choices when eating out.   CARDIAC REHAB PHASE II ORIENTATION from 10/13/2017 in [place]  Date  10/13/17  Educator  RD      Diabetes Question &  Answer:  -Group instruction provided by PowerPoint slides, verbal discussion, and written materials to support subject matter. The instructor gives an explanation and review of diabetes  co-morbidities, pre- and post-prandial blood glucose goals, pre-exercise blood glucose goals, signs, symptoms, and treatment of hypoglycemia and hyperglycemia, and foot care basics.   Diabetes Blitz:  -Group instruction provided by PowerPoint slides, verbal discussion, and written materials to support subject matter. The instructor gives an explanation and review of the physiology behind type 1 and type 2 diabetes, diabetes medications and rational behind using different medications, pre- and post-prandial blood glucose recommendations and Hemoglobin A1c goals, diabetes diet, and exercise including blood glucose guidelines for exercising safely.    Portion Distortion:  -Group instruction provided by PowerPoint slides, verbal discussion, written materials, and food models to support subject matter. The instructor gives an explanation of serving size versus portion size, changes in portions sizes over the last 20 years, and what consists of a serving from each food group.   Stress Management:  -Group instruction provided by verbal instruction, video, and written materials to support subject matter.  Instructors review role of stress in heart disease and how to cope with stress positively.     Exercising on Your Own:  -Group instruction provided by verbal instruction, power point, and written materials to support subject.  Instructors discuss benefits of exercise, components of exercise, frequency and intensity of exercise, and end points for exercise.  Also discuss use of nitroglycerin and activating EMS.  Review options of places to exercise outside of rehab.  Review guidelines for sex with heart disease.   Cardiac Drugs I:  -Group instruction provided by verbal instruction and written materials to support subject.  Instructor reviews cardiac drug classes: antiplatelets, anticoagulants, beta blockers, and statins.  Instructor discusses reasons, side effects, and lifestyle considerations for each  drug class.   Cardiac Drugs II:  -Group instruction provided by verbal instruction and written materials to support subject.  Instructor reviews cardiac drug classes: angiotensin converting enzyme inhibitors (ACE-I), angiotensin II receptor blockers (ARBs), nitrates, and calcium channel blockers.  Instructor discusses reasons, side effects, and lifestyle considerations for each drug class.   Anatomy and Physiology of the Circulatory System:  Group verbal and written instruction and models provide basic cardiac anatomy and physiology, with the coronary electrical and arterial systems. Review of: AMI, Angina, Valve disease, Heart Failure, Peripheral Artery Disease, Cardiac Arrhythmia, Pacemakers, and the ICD.   Other Education:  -Group or individual verbal, written, or video instructions that support the educational goals of the cardiac rehab program.   Holiday Eating Survival Tips:  -Group instruction provided by PowerPoint slides, verbal discussion, and written materials to support subject matter. The instructor gives patients tips, tricks, and techniques to help them not only survive but enjoy the holidays despite the onslaught of food that accompanies the holidays.   Knowledge Questionnaire Score: Knowledge Questionnaire Score - 10/13/17 1152      Knowledge Questionnaire Score   Pre Score  16/24       Core Components/Risk Factors/Patient Goals at Admission: Personal Goals and Risk Factors at Admission - 10/13/17 1215      Core Components/Risk Factors/Patient Goals on Admission    Weight Management  Yes;Obesity;Weight Maintenance;Weight Loss    Intervention  Weight Management: Develop a combined nutrition and exercise program designed to reach desired caloric intake, while maintaining appropriate intake of nutrient and fiber, sodium and fats, and appropriate energy expenditure required for the weight goal.;Weight Management: Provide education and appropriate  resources to help  participant work on and attain dietary goals.;Weight Management/Obesity: Establish reasonable short term and long term weight goals.;Obesity: Provide education and appropriate resources to help participant work on and attain dietary goals.    Admit Weight  185 lb 3 oz (84 kg)    Goal Weight: Short Term  180 lb (81.6 kg)    Goal Weight: Long Term  165 lb (74.8 kg)    Expected Outcomes  Short Term: Continue to assess and modify interventions until short term weight is achieved;Long Term: Adherence to nutrition and physical activity/exercise program aimed toward attainment of established weight goal;Weight Maintenance: Understanding of the daily nutrition guidelines, which includes 25-35% calories from fat, 7% or less cal from saturated fats, less than 200mg  cholesterol, less than 1.5gm of sodium, & 5 or more servings of fruits and vegetables daily;Weight Loss: Understanding of general recommendations for a balanced deficit meal plan, which promotes 1-2 lb weight loss per week and includes a negative energy balance of [phone] kcal/d;Understanding recommendations for meals to include 15-35% energy as protein, 25-35% energy from fat, 35-60% energy from carbohydrates, less than 200mg  of dietary cholesterol, 20-35 gm of total fiber daily;Understanding of distribution of calorie intake throughout the day with the consumption of 4-5 meals/snacks    Improve shortness of breath with ADL's  Yes    Intervention  Provide education, individualized exercise plan and daily activity instruction to help decrease symptoms of SOB with activities of daily living.    Expected Outcomes  Short Term: Improve cardiorespiratory fitness to achieve a reduction of symptoms when performing ADLs;Long Term: Be able to perform more ADLs without symptoms or delay the onset of symptoms    Hypertension  Yes    Intervention  Provide education on lifestyle modifcations including regular physical activity/exercise, weight management, moderate  sodium restriction and increased consumption of fresh fruit, vegetables, and low fat dairy, alcohol moderation, and smoking cessation.;Monitor prescription use compliance.    Expected Outcomes  Short Term: Continued assessment and intervention until BP is < 140/13mm HG in hypertensive participants. < 130/52mm HG in hypertensive participants with diabetes, heart failure or chronic kidney disease.;Long Term: Maintenance of blood pressure at goal levels.    Lipids  Yes    Intervention  Provide education and support for participant on nutrition & aerobic/resistive exercise along with prescribed medications to achieve LDL 70mg , HDL >40mg .    Expected Outcomes  Short Term: Participant states understanding of desired cholesterol values and is compliant with medications prescribed. Participant is following exercise prescription and nutrition guidelines.;Long Term: Cholesterol controlled with medications as prescribed, with individualized exercise RX and with personalized nutrition plan. Value goals: LDL < 70mg , HDL > 40 mg.    Stress  Yes    Intervention  Offer individual and/or small group education and counseling on adjustment to heart disease, stress management and health-related lifestyle change. Teach and support self-help strategies.;Refer participants experiencing significant psychosocial distress to appropriate mental health specialists for further evaluation and treatment. When possible, include family members and significant others in education/counseling sessions.    Expected Outcomes  Short Term: Participant demonstrates changes in health-related behavior, relaxation and other stress management skills, ability to obtain effective social support, and compliance with psychotropic medications if prescribed.;Long Term: Emotional wellbeing is indicated by absence of clinically significant psychosocial distress or social isolation.       Core Components/Risk Factors/Patient Goals Review:    Core  Components/Risk Factors/Patient Goals at Discharge (Final Review):    ITP Comments: ITP Comments  [place] Name 10/13/17 0901           ITP Comments  Medical Director, Dr. Fransico Him          Comments: Patient attended orientation from 0813to 1018 to review rules and guidelines for program. Completed 6 minute walk test, Intitial ITP, and exercise prescription.  VSS. Telemetry-sinsu rhythm  Asymptomatic. Andi Hence, RN, BSN Cardiac Pulmonary Rehab 10/13/17  12:44 PM

## 2017-10-13 NOTE — Progress Notes (Signed)
Bianca [name] 72 y.o. female DOB: April 03, 1946 MRN: [id number]      Nutrition Note  Dx: s/p CABG x 3 Past Medical History:  Diagnosis Date  . Arthritis   . Blood transfusion without reported diagnosis   . CAD (coronary artery disease)    a. 1997 MI/PCI RCA;  b. 2007 MI/PCI of 100% RCA with Taxus DES x 3 placed, EF 55%;  c. 2010 Cath: stable anatomy;  d. 05/2012 NSTEMI/Cath/PCI: LM nl, LAD 60-85m, D1 20ost, LCX 54m, RCA 40-6m ISR, 60/95d (Treated w/ 2.75x33 Xience Xpedition DES), PDA 72m (Treated w/ PTCA), EF 60%.  . Carpal tunnel syndrome on both sides   . Chronic lower back pain   . Depression   . GERD (gastroesophageal reflux disease)   . Hyperlipidemia   . Myocardial infarction [facility]) 1997, 2007, 2013  . Numbness of foot    Left foot - back surgery 2009   Meds reviewed.   HT: Ht Readings from Last 1 Encounters:  08/28/17 5\' 1"  (1.549 m)    WT: Wt Readings from Last 3 Encounters:  08/28/17 180 lb 6.4 oz (81.8 kg)  06/29/17 177 lb (80.3 kg)  06/26/17 177 lb 12.8 oz (80.6 kg)     BMI 34.1   Current tobacco use? No Recently quit tobacco use 06/01/17  Labs:  Lipid Panel     Component Value Date/Time   CHOL 233 (H) 10/31/2016 0915   TRIG 316.0 (H) 10/31/2016 0915   HDL 48.80 10/31/2016 0915   CHOLHDL 5 10/31/2016 0915   VLDL 63.2 (H) 10/31/2016 0915   LDLCALC 106 (H) 01/03/2014 0939   LDLDIRECT 129.0 10/31/2016 0915    Lab Results  Component Value Date   HGBA1C 5.8 02/14/2015   CBG (last 3)  No results for input(s): GLUCAP in the last 72 hours.  Nutrition Note Spoke with pt and pt's husband. Nutrition plan and goals reviewed with pt. Pt wants to lose wt. Wt loss tips discussed. Pt is pre-diabetic according to her last A1c. Per discussion, pt is aware of pre-diabetes dx and has a family h/o DM. Pt expressed understanding of the information reviewed. Pt aware of nutrition education classes offered.  Nutrition Diagnosis ? Food-and nutrition-related knowledge deficit  related to lack of exposure to information as related to diagnosis of: ? CVD ? Pre-DM ? Obesity related to excessive energy intake as evidenced by a BMI of 34.1  Nutrition Intervention ? Pt's individual nutrition plan and goals reviewed with pt. ? Pt given handouts for: ? Nutrition I class ? Nutrition II class   Nutrition Goal(s):  ? Pt to identify and limit food sources of saturated fat, trans fat, and sodium ? Pt to identify food quantities necessary to achieve weight loss of 6-24 lb at graduation from cardiac rehab.  Plan:  Pt to attend nutrition classes ? Nutrition I ? Nutrition II ? Portion Distortion  Will provide client-centered nutrition education as part of interdisciplinary care.   Monitor and evaluate progress toward nutrition goal with team.  Derek Mound, M.Ed, RD, LDN, CDE 10/13/2017 8:52 AM

## 2017-10-19 ENCOUNTER — Telehealth (HOSPITAL_COMMUNITY): Payer: Self-pay | Admitting: *Deleted

## 2017-10-19 ENCOUNTER — Encounter (HOSPITAL_COMMUNITY): Payer: Self-pay

## 2017-10-19 ENCOUNTER — Encounter (HOSPITAL_COMMUNITY): Payer: BLUE CROSS/BLUE SHIELD

## 2017-10-21 ENCOUNTER — Encounter (HOSPITAL_COMMUNITY): Payer: Self-pay

## 2017-10-21 ENCOUNTER — Encounter (HOSPITAL_COMMUNITY)
Admission: RE | Admit: 2017-10-21 | Discharge: 2017-10-21 | Disposition: A | Discharge status: Home / Self Care | Payer: BLUE CROSS/BLUE SHIELD | Source: Ambulatory Visit | Attending: Internal Medicine | Admitting: Internal Medicine

## 2017-10-21 DIAGNOSIS — E785 Hyperlipidemia, unspecified: Secondary | ICD-10-CM | POA: Diagnosis not present

## 2017-10-21 DIAGNOSIS — Z87891 Personal history of nicotine dependence: Secondary | ICD-10-CM | POA: Insufficient documentation

## 2017-10-21 DIAGNOSIS — Z7902 Long term (current) use of antithrombotics/antiplatelets: Secondary | ICD-10-CM | POA: Insufficient documentation

## 2017-10-21 DIAGNOSIS — Z951 Presence of aortocoronary bypass graft: Secondary | ICD-10-CM | POA: Diagnosis not present

## 2017-10-21 DIAGNOSIS — F329 Major depressive disorder, single episode, unspecified: Secondary | ICD-10-CM | POA: Diagnosis not present

## 2017-10-21 DIAGNOSIS — K219 Gastro-esophageal reflux disease without esophagitis: Secondary | ICD-10-CM | POA: Diagnosis not present

## 2017-10-21 DIAGNOSIS — M1990 Unspecified osteoarthritis, unspecified site: Secondary | ICD-10-CM | POA: Insufficient documentation

## 2017-10-21 DIAGNOSIS — I252 Old myocardial infarction: Secondary | ICD-10-CM | POA: Insufficient documentation

## 2017-10-21 DIAGNOSIS — I2510 Atherosclerotic heart disease of native coronary artery without angina pectoris: Secondary | ICD-10-CM | POA: Diagnosis not present

## 2017-10-21 DIAGNOSIS — Z955 Presence of coronary angioplasty implant and graft: Secondary | ICD-10-CM | POA: Insufficient documentation

## 2017-10-21 DIAGNOSIS — Z79899 Other long term (current) drug therapy: Secondary | ICD-10-CM | POA: Diagnosis not present

## 2017-10-21 NOTE — Progress Notes (Signed)
Daily Session Note  Patient Details  Name: Bianca [name] MRN: [id number] Date of Birth: 1945/12/06 Referring Provider:     CARDIAC REHAB PHASE II ORIENTATION from 10/13/2017 in [place]  Referring Provider  Dorris Carnes MD      Encounter Date: 10/21/2017  Check In: Session Check In - 10/21/17 1447      Check-In   Location  [facility]    Staff Present  Dorma Russell, MS,ACSM CEP, Exercise Physiologist;Amber Fair, MS, ACSM RCEP, Exercise Physiologist;Joann Rion, RN, Marga Melnick, RN, BSN    Supervising physician immediately available to respond to emergencies  [facility] immediately available    Physician(s)  Dr. Horris Latino    Medication changes reported      No    Fall or balance concerns reported     No    Tobacco Cessation  No Change    Warm-up and Cool-down  Performed as group-led instruction    Resistance Training Performed  No    VAD Patient?  No      Pain Assessment   Currently in Pain?  No/denies       Capillary Blood Glucose: No results found for this or any previous visit (from the past 24 hour(s)).    Social History   Tobacco Use  Smoking Status Former Smoker  . Packs/day: 0.50  . Years: 50.00  . Pack years: 25.00  . Types: Cigarettes  . Last attempt to quit: 06/01/2017  . Years since quitting: 0.3  Smokeless Tobacco Never Used    Goals Met:  Exercise tolerated well  Goals Unmet:  Not Applicable  Comments: Pt started cardiac rehab today.  Pt tolerated light exercise without difficulty. VSS, telemetry-sinus rhythm,  asymptomatic.  Medication list reconciled. Pt denies barriers to medicaiton compliance.  PSYCHOSOCIAL ASSESSMENT:  PHQ-.7.  Pt with history of depression. Currently being treated by psychiatrist being seen monthly.     Pt enjoys shopping, however she is currently limited by back pain.   Pt oriented to exercise equipment and routine.    Understanding verbalized.   Dr. Fransico Him  is Medical Director for Cardiac Rehab at [facility].

## 2017-10-23 ENCOUNTER — Encounter (HOSPITAL_COMMUNITY)
Admission: RE | Admit: 2017-10-23 | Discharge: 2017-10-23 | Disposition: A | Discharge status: Home / Self Care | Payer: BLUE CROSS/BLUE SHIELD | Source: Ambulatory Visit | Attending: Internal Medicine | Admitting: Internal Medicine

## 2017-10-23 DIAGNOSIS — Z87891 Personal history of nicotine dependence: Secondary | ICD-10-CM | POA: Diagnosis not present

## 2017-10-23 DIAGNOSIS — Z79899 Other long term (current) drug therapy: Secondary | ICD-10-CM | POA: Diagnosis not present

## 2017-10-23 DIAGNOSIS — K219 Gastro-esophageal reflux disease without esophagitis: Secondary | ICD-10-CM | POA: Diagnosis not present

## 2017-10-23 DIAGNOSIS — F329 Major depressive disorder, single episode, unspecified: Secondary | ICD-10-CM | POA: Diagnosis not present

## 2017-10-23 DIAGNOSIS — M1990 Unspecified osteoarthritis, unspecified site: Secondary | ICD-10-CM | POA: Diagnosis not present

## 2017-10-23 DIAGNOSIS — Z951 Presence of aortocoronary bypass graft: Secondary | ICD-10-CM | POA: Diagnosis not present

## 2017-10-23 DIAGNOSIS — I2510 Atherosclerotic heart disease of native coronary artery without angina pectoris: Secondary | ICD-10-CM | POA: Diagnosis not present

## 2017-10-23 DIAGNOSIS — I252 Old myocardial infarction: Secondary | ICD-10-CM | POA: Diagnosis not present

## 2017-10-23 DIAGNOSIS — E785 Hyperlipidemia, unspecified: Secondary | ICD-10-CM | POA: Diagnosis not present

## 2017-10-23 DIAGNOSIS — Z955 Presence of coronary angioplasty implant and graft: Secondary | ICD-10-CM | POA: Diagnosis not present

## 2017-10-23 DIAGNOSIS — Z7902 Long term (current) use of antithrombotics/antiplatelets: Secondary | ICD-10-CM | POA: Diagnosis not present

## 2017-10-26 ENCOUNTER — Encounter (HOSPITAL_COMMUNITY)
Admission: RE | Admit: 2017-10-26 | Discharge: 2017-10-26 | Disposition: A | Discharge status: Home / Self Care | Payer: BLUE CROSS/BLUE SHIELD | Source: Ambulatory Visit | Attending: Internal Medicine | Admitting: Internal Medicine

## 2017-10-26 DIAGNOSIS — Z951 Presence of aortocoronary bypass graft: Secondary | ICD-10-CM

## 2017-10-26 DIAGNOSIS — Z955 Presence of coronary angioplasty implant and graft: Secondary | ICD-10-CM | POA: Diagnosis not present

## 2017-10-26 DIAGNOSIS — Z87891 Personal history of nicotine dependence: Secondary | ICD-10-CM | POA: Diagnosis not present

## 2017-10-26 DIAGNOSIS — Z7902 Long term (current) use of antithrombotics/antiplatelets: Secondary | ICD-10-CM | POA: Diagnosis not present

## 2017-10-26 DIAGNOSIS — M1990 Unspecified osteoarthritis, unspecified site: Secondary | ICD-10-CM | POA: Diagnosis not present

## 2017-10-26 DIAGNOSIS — I252 Old myocardial infarction: Secondary | ICD-10-CM | POA: Diagnosis not present

## 2017-10-26 DIAGNOSIS — E785 Hyperlipidemia, unspecified: Secondary | ICD-10-CM | POA: Diagnosis not present

## 2017-10-26 DIAGNOSIS — F329 Major depressive disorder, single episode, unspecified: Secondary | ICD-10-CM | POA: Diagnosis not present

## 2017-10-26 DIAGNOSIS — K219 Gastro-esophageal reflux disease without esophagitis: Secondary | ICD-10-CM | POA: Diagnosis not present

## 2017-10-26 DIAGNOSIS — Z79899 Other long term (current) drug therapy: Secondary | ICD-10-CM | POA: Diagnosis not present

## 2017-10-26 DIAGNOSIS — I2510 Atherosclerotic heart disease of native coronary artery without angina pectoris: Secondary | ICD-10-CM | POA: Diagnosis not present

## 2017-10-26 NOTE — Progress Notes (Signed)
Cardiology Office Note    Date:  10/27/2017   ID:  Bianca [name], DOB 29-Mar-1946, MRN [id number]  PCP:  [name], NP  Cardiologist: Sinclair Grooms, MD   Chief Complaint  Patient presents with  . Coronary Artery Disease    History of Present Illness:  Bianca [name] is a 72 y.o. female with coronary artery disease, prior inferior MI 1997, prior right coronary drug-eluting stents 2007, non-ST elevation MI 2018, and ultimate coronary bypass grafting with LIMA to LAD, SVG to obtuse marginal, and SVG to PDA October 2018.  Amiodarone therapy was used for postoperative atrial fibrillation. Significant chronic pain problem related to her back.  She is doing well.  She still having dysesthesias in the sternal area related to prior surgery.  Still have another pain syndrome issues.  Also has abdominal pain.  Her symptoms prior to bypass was exertional dyspnea.  This is improved.  She is enrolled in cardiac rehab.  No chest pain.  Cardiac medication regimen is simple.  She is on Plavix and atorvastatin.   Past Medical History:  Diagnosis Date  . Arthritis   . Blood transfusion without reported diagnosis   . CAD (coronary artery disease)    a. 1997 MI/PCI RCA;  b. 2007 MI/PCI of 100% RCA with Taxus DES x 3 placed, EF 55%;  c. 2010 Cath: stable anatomy;  d. 05/2012 NSTEMI/Cath/PCI: LM nl, LAD 60-61m, D1 20ost, LCX 67m, RCA 40-70m ISR, 60/95d (Treated w/ 2.75x33 Xience Xpedition DES), PDA 52m (Treated w/ PTCA), EF 60%.  . Carpal tunnel syndrome on both sides   . Chronic lower back pain   . Depression   . GERD (gastroesophageal reflux disease)   . Hyperlipidemia   . [facility]) 1997, 2007, 2013  . Numbness of foot    Left foot - back surgery 2009    Past Surgical History:  Procedure Laterality Date  . ANKLE SURGERY  Years ago   Left; "had to take out a floater"  . CARPAL TUNNEL RELEASE Left 10/12016  . CESAREAN SECTION  1990  . CORONARY ANGIOPLASTY WITH STENT  PLACEMENT  1997; 2007, 2013   "2 + 2; + 1= total of 5  . CORONARY ARTERY BYPASS GRAFT N/A 05/26/2017   Procedure: CORONARY ARTERY BYPASS GRAFTING (CABG), ON PUMP, TIMES Three, using left internal mammary artery and right greater saphenous vein harvested endoscopically;  Surgeon: [name], MD;  Location: [place];  Service: Open Heart Surgery;  Laterality: N/A;  LIMA to LAD, SVG to OM 1, SVG to PDA  . LEFT HEART CATH AND CORONARY ANGIOGRAPHY N/A 05/26/2017   Procedure: LEFT HEART CATH AND CORONARY ANGIOGRAPHY;  Surgeon: Belva Crome, MD;  Location: [facility];  Service: Cardiovascular;  Laterality: N/A;  . LEFT HEART CATHETERIZATION WITH CORONARY ANGIOGRAM N/A 05/18/2012   Procedure: LEFT HEART CATHETERIZATION WITH CORONARY ANGIOGRAM;  Surgeon: Wellington Hampshire, MD;  Location: [place] CATH [facility];  Service: Cardiovascular;  Laterality: N/A;  . [place]; 2009  . TEE WITHOUT CARDIOVERSION N/A 05/26/2017   Procedure: TRANSESOPHAGEAL ECHOCARDIOGRAM (TEE);  Surgeon: [name], MD;  Location: [facility];  Service: Open Heart Surgery;  Laterality: N/A;  . TOTAL HIP ARTHROPLASTY Right 05/24/2014   Procedure: RIGHT TOTAL HIP ARTHROPLASTY ANTERIOR APPROACH;  Surgeon: Gearlean Alf, MD;  Location: [facility];  Service: Orthopedics;  Laterality: Right;    Current Medications: Outpatient Medications Prior to Visit  Medication Sig  Dispense Refill  . acetaminophen (TYLENOL) 325 MG tablet Take 2 tablets (650 mg total) by mouth every 6 (six) hours as needed for fever, headache, mild pain or moderate pain.    Marland Kitchen atorvastatin (LIPITOR) 80 MG tablet TAKE 1 TABLET BY MOUTH EVERY DAY AT 6PM 90 tablet 3  . baclofen (LIORESAL) 10 MG tablet Take 10 mg by mouth at bedtime.  0  . buPROPion (WELLBUTRIN XL) 300 MG 24 hr tablet Take 300 mg by mouth daily.  1  . clopidogrel (PLAVIX) 75 MG tablet Take 1 tablet (75 mg total) by mouth daily. 90 tablet 3  . cyanocobalamin (,VITAMIN B-12,) 1000  MCG/ML injection Inject 1,000 mcg into the muscle every 30 (thirty) days. Vitamin B12   - last injection 05/22/17    . dexlansoprazole (DEXILANT) 60 MG capsule Take 60 mg by mouth daily as needed (acid reflux).    . gabapentin (NEURONTIN) 300 MG capsule Take 300 mg by mouth at bedtime as needed (pain).    . hydrOXYzine (ATARAX/VISTARIL) 25 MG tablet Take 25 mg by mouth at bedtime as needed for anxiety or itching.    . meclizine (ANTIVERT) 25 MG tablet Take 25 mg by mouth 2 (two) times daily as needed for dizziness.    . Methen-Hyosc-Meth Blue-Na Phos (ME/NAPHOS/MB/HYO1) 81.6 MG TABS Take 1 tablet by mouth every 6 (six) hours as needed.  9  . Multiple Vitamin (MULTIVITAMIN WITH MINERALS) TABS tablet Take 1 tablet by mouth daily.    . Multiple Vitamins-Minerals (MACULAR HEALTH FORMULA) CAPS Take 1 capsule by mouth daily.    . pentosan polysulfate (ELMIRON) 100 MG capsule Take 200 mg by mouth 2 (two) times daily.    . polyvinyl alcohol (ARTIFICIAL TEARS) 1.4 % ophthalmic solution Place 1 drop into both eyes 2 (two) times daily as needed for dry eyes.    . Probiotic Product (PROBIOTIC PO) Take 1 capsule by mouth at bedtime. Nature Bounty's brand    . Red Yeast Rice Extract (RED YEAST RICE PO) Take 1 capsule by mouth at bedtime.    . traZODone (DESYREL) 50 MG tablet Take 100 mg by mouth at bedtime.   0  . dexlansoprazole (DEXILANT) 60 MG capsule Take 1 capsule (60 mg total) by mouth daily. (Patient not taking: Reported on 10/27/2017) 30 capsule 2  . gabapentin (NEURONTIN) 300 MG capsule Take 300 mg by mouth at bedtime.     . hydrOXYzine (ATARAX/VISTARIL) 25 MG tablet Take 25 mg by mouth at bedtime.    . meclizine (ANTIVERT) 25 MG tablet Take 1 tablet (25 mg total) by mouth 2 (two) times daily. (Patient not taking: Reported on 10/27/2017) 40 tablet 0   No facility-administered medications prior to visit.      Allergies:   Oxycodone hcl; Penicillins; Tramadol; Aspirin; Macrobid [nitrofurantoin monohyd  macro]; and Sulfa antibiotics   Social History   Socioeconomic History  . Marital status: Married    Spouse name: None  . Number of children: None  . Years of education: None  . Highest education level: None  Social Needs  . Financial resource strain: None  . Food insecurity - worry: None  . Food insecurity - inability: None  . Transportation needs - medical: None  . Transportation needs - non-medical: None  Occupational History  . None  Tobacco Use  . Smoking status: Former Smoker    Packs/day: 0.50    Years: 50.00    Pack years: 25.00    Types: Cigarettes    Last  attempt to quit: 06/01/2017    Years since quitting: 0.4  . Smokeless tobacco: Never Used  Substance and Sexual Activity  . Alcohol use: No    Alcohol/week: 0.0 oz  . Drug use: No  . Sexual activity: Not Currently    Birth control/protection: Post-menopausal  Other Topics Concern  . None  Social History Narrative   Is a homemaker   Married for 51 years    Has a daughter and son    Pets: Two dogs and a Neurosurgeon   Likes to garden ( flower beds).         Family History:  The patient's family history includes Cancer in her paternal grandfather; Coronary artery disease in her mother; Dementia in her father; Diabetes in her mother; Hypertension in her mother; Sudden death in her brother.   ROS:   Please see the history of present illness.    Difficulty with some dyspnea on exertion.  Strength is improved.  Parasternal soreness continues. All other systems reviewed and are negative.   PHYSICAL EXAM:   VS:  BP 100/70   Pulse 84   Ht 5\' 2"  (1.575 m)   Wt 177 lb 6.4 oz (80.5 kg)   LMP  (LMP Unknown)   BMI 32.45 kg/m    GEN: Well nourished, well developed, in no acute distress  HEENT: normal  Neck: no JVD, carotid bruits, or masses Cardiac: RRR; no murmurs, rubs, or gallops,no edema  Respiratory:  clear to auscultation bilaterally, normal work of breathing GI: soft, nontender, nondistended, + BS MS: no  deformity or atrophy  Skin: warm and dry, no rash Neuro:  Alert and Oriented x 3, Strength and sensation are intact Psych: euthymic mood, full affect  Wt Readings from Last 3 Encounters:  10/27/17 177 lb 6.4 oz (80.5 kg)  10/13/17 185 lb 3 oz (84 kg)  08/28/17 180 lb 6.4 oz (81.8 kg)      Studies/Labs Reviewed:   EKG:  EKG  Not done  Recent Labs: 01/02/2017: ALT 25 05/08/2017: NT-Pro BNP 226 05/28/2017: TSH 5.685 06/02/2017: Hemoglobin 9.0; Magnesium 1.9; Platelets 276 06/17/2017: BUN 10; Creatinine, Ser 1.06; Potassium 4.9; Sodium 141   Lipid Panel    Component Value Date/Time   CHOL 233 (H) 10/31/2016 0915   TRIG 316.0 (H) 10/31/2016 0915   HDL 48.80 10/31/2016 0915   CHOLHDL 5 10/31/2016 0915   VLDL 63.2 (H) 10/31/2016 0915   LDLCALC 106 (H) 01/03/2014 0939   LDLDIRECT 129.0 10/31/2016 0915    Additional studies/ records that were reviewed today include:  No new data    ASSESSMENT:    1. Hyperlipidemia with target LDL less than 70   2. Coronary artery disease involving coronary bypass graft of native heart with angina pectoris ([place])   3. Postoperative atrial fibrillation (HCC)   4. Cigarette smoker      PLAN:  In order of problems listed above:  1. Liver and lipid panel will be done sometime over the next month 2. Stable post coronary bypass grafting.  Participating cardiac rehab without difficulty. 3. No obvious recurrences of atrial fibrillation. 4. Not smoking.  Continue cardiac rehab.  Clinical follow-up in 3-6 months.    Medication Adjustments/Labs and Tests Ordered: Current medicines are reviewed at length with the patient today.  Concerns regarding medicines are outlined above.  Medication changes, Labs and Tests ordered today are listed in the Patient Instructions below. Patient Instructions  Medication Instructions:  Your physician recommends that you continue on your  current medications as directed. Please refer to the Current Medication  list given to you today.  Labwork: Your physician recommends that you return for lab work in: 1-2 months (Liver, Lipid)   Testing/Procedures: None  Follow-Up: Your physician wants you to follow-up in: 4-6 months with Dr. Tamala Julian.  You will receive a reminder letter in the mail two months in advance. If you don't receive a letter, please call our office to schedule the follow-up appointment.   Any Other Special Instructions Will Be Listed Below (If Applicable).     If you need a refill on your cardiac medications before your next appointment, please call your pharmacy.      Signed, Sinclair Grooms, MD  10/27/2017 3:11 PM    [facility] [place] [place], [place], [place]  [address] Phone: [phone]; Fax: [phone]

## 2017-10-27 ENCOUNTER — Ambulatory Visit: Payer: BLUE CROSS/BLUE SHIELD | Admitting: Interventional Cardiology

## 2017-10-27 ENCOUNTER — Encounter (HOSPITAL_COMMUNITY): Payer: Self-pay

## 2017-10-27 ENCOUNTER — Encounter (INDEPENDENT_AMBULATORY_CARE_PROVIDER_SITE_OTHER): Payer: Self-pay

## 2017-10-27 ENCOUNTER — Encounter: Payer: Self-pay | Admitting: Interventional Cardiology

## 2017-10-27 VITALS — BP 100/70 | HR 84 | Ht 62.0 in | Wt 177.4 lb

## 2017-10-27 DIAGNOSIS — F17210 Nicotine dependence, cigarettes, uncomplicated: Secondary | ICD-10-CM | POA: Diagnosis not present

## 2017-10-27 DIAGNOSIS — I25709 Atherosclerosis of coronary artery bypass graft(s), unspecified, with unspecified angina pectoris: Secondary | ICD-10-CM | POA: Diagnosis not present

## 2017-10-27 DIAGNOSIS — E785 Hyperlipidemia, unspecified: Secondary | ICD-10-CM

## 2017-10-27 DIAGNOSIS — I4891 Unspecified atrial fibrillation: Secondary | ICD-10-CM | POA: Diagnosis not present

## 2017-10-27 DIAGNOSIS — I9789 Other postprocedural complications and disorders of the circulatory system, not elsewhere classified: Secondary | ICD-10-CM | POA: Diagnosis not present

## 2017-10-27 NOTE — Patient Instructions (Signed)
Medication Instructions:  Your physician recommends that you continue on your current medications as directed. Please refer to the Current Medication list given to you today.  Labwork: Your physician recommends that you return for lab work in: 1-2 months (Liver, Lipid)   Testing/Procedures: None  Follow-Up: Your physician wants you to follow-up in: 4-6 months with Dr. Tamala Julian.  You will receive a reminder letter in the mail two months in advance. If you don't receive a letter, please call our office to schedule the follow-up appointment.   Any Other Special Instructions Will Be Listed Below (If Applicable).     If you need a refill on your cardiac medications before your next appointment, please call your pharmacy.

## 2017-10-28 ENCOUNTER — Encounter (HOSPITAL_COMMUNITY)
Admission: RE | Admit: 2017-10-28 | Discharge: 2017-10-28 | Disposition: A | Discharge status: Home / Self Care | Payer: BLUE CROSS/BLUE SHIELD | Source: Ambulatory Visit | Attending: Internal Medicine | Admitting: Internal Medicine

## 2017-10-28 DIAGNOSIS — Z7902 Long term (current) use of antithrombotics/antiplatelets: Secondary | ICD-10-CM | POA: Diagnosis not present

## 2017-10-28 DIAGNOSIS — Z79899 Other long term (current) drug therapy: Secondary | ICD-10-CM | POA: Diagnosis not present

## 2017-10-28 DIAGNOSIS — M1990 Unspecified osteoarthritis, unspecified site: Secondary | ICD-10-CM | POA: Diagnosis not present

## 2017-10-28 DIAGNOSIS — E785 Hyperlipidemia, unspecified: Secondary | ICD-10-CM | POA: Diagnosis not present

## 2017-10-28 DIAGNOSIS — Z955 Presence of coronary angioplasty implant and graft: Secondary | ICD-10-CM | POA: Diagnosis not present

## 2017-10-28 DIAGNOSIS — F329 Major depressive disorder, single episode, unspecified: Secondary | ICD-10-CM | POA: Diagnosis not present

## 2017-10-28 DIAGNOSIS — Z951 Presence of aortocoronary bypass graft: Secondary | ICD-10-CM

## 2017-10-28 DIAGNOSIS — K219 Gastro-esophageal reflux disease without esophagitis: Secondary | ICD-10-CM | POA: Diagnosis not present

## 2017-10-28 DIAGNOSIS — Z87891 Personal history of nicotine dependence: Secondary | ICD-10-CM | POA: Diagnosis not present

## 2017-10-28 DIAGNOSIS — I2510 Atherosclerotic heart disease of native coronary artery without angina pectoris: Secondary | ICD-10-CM | POA: Diagnosis not present

## 2017-10-28 DIAGNOSIS — I252 Old myocardial infarction: Secondary | ICD-10-CM | POA: Diagnosis not present

## 2017-10-30 ENCOUNTER — Encounter (HOSPITAL_COMMUNITY)
Admission: RE | Admit: 2017-10-30 | Discharge: 2017-10-30 | Disposition: A | Discharge status: Home / Self Care | Payer: BLUE CROSS/BLUE SHIELD | Source: Ambulatory Visit | Attending: Internal Medicine | Admitting: Internal Medicine

## 2017-10-30 DIAGNOSIS — Z79899 Other long term (current) drug therapy: Secondary | ICD-10-CM | POA: Diagnosis not present

## 2017-10-30 DIAGNOSIS — Z951 Presence of aortocoronary bypass graft: Secondary | ICD-10-CM | POA: Diagnosis not present

## 2017-10-30 DIAGNOSIS — I252 Old myocardial infarction: Secondary | ICD-10-CM | POA: Diagnosis not present

## 2017-10-30 DIAGNOSIS — I2510 Atherosclerotic heart disease of native coronary artery without angina pectoris: Secondary | ICD-10-CM | POA: Diagnosis not present

## 2017-10-30 DIAGNOSIS — K219 Gastro-esophageal reflux disease without esophagitis: Secondary | ICD-10-CM | POA: Diagnosis not present

## 2017-10-30 DIAGNOSIS — Z955 Presence of coronary angioplasty implant and graft: Secondary | ICD-10-CM | POA: Diagnosis not present

## 2017-10-30 DIAGNOSIS — M1990 Unspecified osteoarthritis, unspecified site: Secondary | ICD-10-CM | POA: Diagnosis not present

## 2017-10-30 DIAGNOSIS — E785 Hyperlipidemia, unspecified: Secondary | ICD-10-CM | POA: Diagnosis not present

## 2017-10-30 DIAGNOSIS — Z7902 Long term (current) use of antithrombotics/antiplatelets: Secondary | ICD-10-CM | POA: Diagnosis not present

## 2017-10-30 DIAGNOSIS — Z87891 Personal history of nicotine dependence: Secondary | ICD-10-CM | POA: Diagnosis not present

## 2017-10-30 DIAGNOSIS — F329 Major depressive disorder, single episode, unspecified: Secondary | ICD-10-CM | POA: Diagnosis not present

## 2017-11-02 ENCOUNTER — Encounter (HOSPITAL_COMMUNITY)
Admission: RE | Admit: 2017-11-02 | Discharge: 2017-11-02 | Disposition: A | Discharge status: Home / Self Care | Payer: BLUE CROSS/BLUE SHIELD | Source: Ambulatory Visit | Attending: Internal Medicine | Admitting: Internal Medicine

## 2017-11-02 DIAGNOSIS — Z87891 Personal history of nicotine dependence: Secondary | ICD-10-CM | POA: Diagnosis not present

## 2017-11-02 DIAGNOSIS — M1990 Unspecified osteoarthritis, unspecified site: Secondary | ICD-10-CM | POA: Diagnosis not present

## 2017-11-02 DIAGNOSIS — I2510 Atherosclerotic heart disease of native coronary artery without angina pectoris: Secondary | ICD-10-CM | POA: Diagnosis not present

## 2017-11-02 DIAGNOSIS — F329 Major depressive disorder, single episode, unspecified: Secondary | ICD-10-CM | POA: Diagnosis not present

## 2017-11-02 DIAGNOSIS — Z955 Presence of coronary angioplasty implant and graft: Secondary | ICD-10-CM | POA: Diagnosis not present

## 2017-11-02 DIAGNOSIS — Z951 Presence of aortocoronary bypass graft: Secondary | ICD-10-CM

## 2017-11-02 DIAGNOSIS — F332 Major depressive disorder, recurrent severe without psychotic features: Secondary | ICD-10-CM | POA: Diagnosis not present

## 2017-11-02 DIAGNOSIS — K219 Gastro-esophageal reflux disease without esophagitis: Secondary | ICD-10-CM | POA: Diagnosis not present

## 2017-11-02 DIAGNOSIS — I252 Old myocardial infarction: Secondary | ICD-10-CM | POA: Diagnosis not present

## 2017-11-02 DIAGNOSIS — Z7902 Long term (current) use of antithrombotics/antiplatelets: Secondary | ICD-10-CM | POA: Diagnosis not present

## 2017-11-02 DIAGNOSIS — Z79899 Other long term (current) drug therapy: Secondary | ICD-10-CM | POA: Diagnosis not present

## 2017-11-02 DIAGNOSIS — E785 Hyperlipidemia, unspecified: Secondary | ICD-10-CM | POA: Diagnosis not present

## 2017-11-03 DIAGNOSIS — M5136 Other intervertebral disc degeneration, lumbar region: Secondary | ICD-10-CM | POA: Diagnosis not present

## 2017-11-03 DIAGNOSIS — M546 Pain in thoracic spine: Secondary | ICD-10-CM | POA: Diagnosis not present

## 2017-11-03 DIAGNOSIS — M47816 Spondylosis without myelopathy or radiculopathy, lumbar region: Secondary | ICD-10-CM | POA: Diagnosis not present

## 2017-11-03 DIAGNOSIS — Z981 Arthrodesis status: Secondary | ICD-10-CM | POA: Diagnosis not present

## 2017-11-03 DIAGNOSIS — M549 Dorsalgia, unspecified: Secondary | ICD-10-CM | POA: Diagnosis not present

## 2017-11-03 DIAGNOSIS — M48062 Spinal stenosis, lumbar region with neurogenic claudication: Secondary | ICD-10-CM | POA: Diagnosis not present

## 2017-11-04 ENCOUNTER — Encounter (HOSPITAL_COMMUNITY)
Admission: RE | Admit: 2017-11-04 | Discharge: 2017-11-04 | Disposition: A | Discharge status: Home / Self Care | Payer: BLUE CROSS/BLUE SHIELD | Source: Ambulatory Visit | Attending: Internal Medicine | Admitting: Internal Medicine

## 2017-11-04 ENCOUNTER — Telehealth: Payer: Self-pay

## 2017-11-04 DIAGNOSIS — Z7902 Long term (current) use of antithrombotics/antiplatelets: Secondary | ICD-10-CM | POA: Diagnosis not present

## 2017-11-04 DIAGNOSIS — Z951 Presence of aortocoronary bypass graft: Secondary | ICD-10-CM

## 2017-11-04 DIAGNOSIS — F329 Major depressive disorder, single episode, unspecified: Secondary | ICD-10-CM | POA: Diagnosis not present

## 2017-11-04 DIAGNOSIS — I252 Old myocardial infarction: Secondary | ICD-10-CM | POA: Diagnosis not present

## 2017-11-04 DIAGNOSIS — M1990 Unspecified osteoarthritis, unspecified site: Secondary | ICD-10-CM | POA: Diagnosis not present

## 2017-11-04 DIAGNOSIS — E785 Hyperlipidemia, unspecified: Secondary | ICD-10-CM | POA: Diagnosis not present

## 2017-11-04 DIAGNOSIS — Z79899 Other long term (current) drug therapy: Secondary | ICD-10-CM | POA: Diagnosis not present

## 2017-11-04 DIAGNOSIS — K219 Gastro-esophageal reflux disease without esophagitis: Secondary | ICD-10-CM | POA: Diagnosis not present

## 2017-11-04 DIAGNOSIS — I2510 Atherosclerotic heart disease of native coronary artery without angina pectoris: Secondary | ICD-10-CM | POA: Diagnosis not present

## 2017-11-04 DIAGNOSIS — Z955 Presence of coronary angioplasty implant and graft: Secondary | ICD-10-CM | POA: Diagnosis not present

## 2017-11-04 DIAGNOSIS — M47816 Spondylosis without myelopathy or radiculopathy, lumbar region: Secondary | ICD-10-CM | POA: Diagnosis not present

## 2017-11-04 DIAGNOSIS — Z87891 Personal history of nicotine dependence: Secondary | ICD-10-CM | POA: Diagnosis not present

## 2017-11-04 NOTE — Telephone Encounter (Signed)
   [facility] Pre-operative Risk Assessment    Request for surgical clearance:  1. What type of surgery is being performed? Spinal Injection   2. When is this surgery scheduled? TBD   3. What type of clearance is required (medical clearance vs. Pharmacy clearance to hold med vs. Both)? Both  4. Are there any medications that need to be held prior to surgery and how long? Plavix and Aspirin prior to surgery   5. Practice name and name of physician performing surgery? [facility] - Dr. Jovita Gamma   6. What is your office phone and fax number? Phone: [phone] ext. [phone], Fax: [phone] attn: Nikki   7. Anesthesia type (None, local, MAC, general) ? None listed   Jacinta Shoe 11/04/2017, 2:29 PM  _________________________________________________________________   (provider comments below)

## 2017-11-04 NOTE — Progress Notes (Signed)
Reviewed home exercise with pt today.  Pt plans to walk for 10', 3x/day and do seated chair exercises 3x/week.  Reviewed THR, pulse, RPE, sign and symptoms, NTG use, and when to call 911 or MD.  Also discussed weather considerations and indoor options.  Pt voiced understanding.    Bianca [name] [employer]

## 2017-11-05 ENCOUNTER — Encounter (HOSPITAL_COMMUNITY): Payer: Self-pay

## 2017-11-05 NOTE — Progress Notes (Signed)
Cardiac Individual Treatment Plan  Patient Details  Name: Bianca [name] MRN: [id number] Date of Birth: 02-14-1946 Referring Provider:   Flowsheet Row CARDIAC REHAB PHASE II ORIENTATION from 10/13/2017 in [facility]  Referring Provider  Dorris Carnes MD      Initial Encounter Date:  [facility] PHASE II ORIENTATION from 10/13/2017 in [place]  Date  10/13/17  Referring Provider  Dorris Carnes MD      Visit Diagnosis: S/P CABG x 3  Patient's Home Medications on Admission:  Current Outpatient Medications:  .  acetaminophen (TYLENOL) 325 MG tablet, Take 2 tablets (650 mg total) by mouth every 6 (six) hours as needed for fever, headache, mild pain or moderate pain., Disp: , Rfl:  .  atorvastatin (LIPITOR) 80 MG tablet, TAKE 1 TABLET BY MOUTH EVERY DAY AT 6PM, Disp: 90 tablet, Rfl: 3 .  baclofen (LIORESAL) 10 MG tablet, Take 10 mg by mouth at bedtime., Disp: , Rfl: 0 .  buPROPion (WELLBUTRIN XL) 300 MG 24 hr tablet, Take 300 mg by mouth daily., Disp: , Rfl: 1 .  clopidogrel (PLAVIX) 75 MG tablet, Take 1 tablet (75 mg total) by mouth daily., Disp: 90 tablet, Rfl: 3 .  cyanocobalamin (,VITAMIN B-12,) 1000 MCG/ML injection, Inject 1,000 mcg into the muscle every 30 (thirty) days. Vitamin B12   - last injection 05/22/17, Disp: , Rfl:  .  dexlansoprazole (DEXILANT) 60 MG capsule, Take 60 mg by mouth daily as needed (acid reflux)., Disp: , Rfl:  .  gabapentin (NEURONTIN) 300 MG capsule, Take 300 mg by mouth at bedtime as needed (pain)., Disp: , Rfl:  .  hydrOXYzine (ATARAX/VISTARIL) 25 MG tablet, Take 25 mg by mouth at bedtime as needed for anxiety or itching., Disp: , Rfl:  .  meclizine (ANTIVERT) 25 MG tablet, Take 25 mg by mouth 2 (two) times daily as needed for dizziness., Disp: , Rfl:  .  Methen-Hyosc-Meth Blue-Na Phos (ME/NAPHOS/MB/HYO1) 81.6 MG TABS, Take 1 tablet by mouth every 6 (six) hours as needed., Disp: ,  Rfl: 9 .  Multiple Vitamin (MULTIVITAMIN WITH MINERALS) TABS tablet, Take 1 tablet by mouth daily., Disp: , Rfl:  .  Multiple Vitamins-Minerals (MACULAR HEALTH FORMULA) CAPS, Take 1 capsule by mouth daily., Disp: , Rfl:  .  pentosan polysulfate (ELMIRON) 100 MG capsule, Take 200 mg by mouth 2 (two) times daily., Disp: , Rfl:  .  polyvinyl alcohol (ARTIFICIAL TEARS) 1.4 % ophthalmic solution, Place 1 drop into both eyes 2 (two) times daily as needed for dry eyes., Disp: , Rfl:  .  Probiotic Product (PROBIOTIC PO), Take 1 capsule by mouth at bedtime. [employer] brand, Disp: , Rfl:  .  Red Yeast Rice Extract (RED YEAST RICE PO), Take 1 capsule by mouth at bedtime., Disp: , Rfl:  .  traZODone (DESYREL) 50 MG tablet, Take 100 mg by mouth at bedtime. , Disp: , Rfl: 0  Past Medical History: Past Medical History:  Diagnosis Date  . Arthritis   . Blood transfusion without reported diagnosis   . CAD (coronary artery disease)    a. 1997 MI/PCI RCA;  b. 2007 MI/PCI of 100% RCA with Taxus DES x 3 placed, EF 55%;  c. 2010 Cath: stable anatomy;  d. 05/2012 NSTEMI/Cath/PCI: LM nl, LAD 60-55m, D1 20ost, LCX 109m, RCA 40-54m ISR, 60/95d (Treated w/ 2.75x33 Xience Xpedition DES), PDA 36m (Treated w/ PTCA), EF 60%.  . Carpal tunnel syndrome on both  sides   . Chronic lower back pain   . Depression   . GERD (gastroesophageal reflux disease)   . Hyperlipidemia   . Myocardial infarction [facility]) 1997, 2007, 2013  . Numbness of foot    Left foot - back surgery 2009    Tobacco Use: Social History   Tobacco Use  Smoking Status Former Smoker  . Packs/day: 0.50  . Years: 50.00  . Pack years: 25.00  . Types: Cigarettes  . Last attempt to quit: 06/01/2017  . Years since quitting: 0.4  Smokeless Tobacco Never Used    Labs: Recent Review Flowsheet Data    Labs for ITP Cardiac and Pulmonary Rehab Latest Ref Rng & Units 05/26/2017 05/27/2017 05/27/2017 05/27/2017 05/27/2017   Cholestrol 0 - 200 mg/dL - - - -  -   LDLCALC 0 - 99 mg/dL - - - - -   LDLDIRECT mg/dL - - - - -   HDL >39.00 mg/dL - - - - -   Trlycerides 0.0 - 149.0 mg/dL - - - - -   Hemoglobin A1c 4.6 - 6.5 % - - - - -   PHART 7.350 - 7.450 7.418 7.215(L) 7.307(L) 7.253(L) -   PCO2ART 32.0 - 48.0 mmHg 37.6 47.9 46.2 50.8(H) -   HCO3 20.0 - 28.0 mmol/L 24.2 19.4(L) 22.9 22.3 -   TCO2 22 - 32 mmol/L 25 21(L) 24 24 24    ACIDBASEDEF 0.0 - 2.0 mmol/L - 8.0(H) 3.0(H) 5.0(H) -   O2SAT % 100.0 96.0 99.0 97.0 -      Capillary Blood Glucose: Lab Results  Component Value Date   GLUCAP 107 (H) 06/03/2017   GLUCAP 105 (H) 06/02/2017   GLUCAP 110 (H) 06/01/2017   GLUCAP 122 (H) 06/01/2017   GLUCAP 112 (H) 06/01/2017     Exercise Target Goals:    Exercise Program Goal: Individual exercise prescription set using results from initial 6 min walk test and THRR while considering  patient's activity barriers and safety.   Exercise Prescription Goal: Initial exercise prescription builds to 30-45 minutes a day of aerobic activity, 2-3 days per week.  Home exercise guidelines will be given to patient during program as part of exercise prescription that the participant will acknowledge.  Activity Barriers & Risk Stratification: Activity Barriers & Cardiac Risk Stratification - 10/13/17 0903    Activity Barriers & Cardiac Risk Stratification          Activity Barriers  Joint Problems;Deconditioning;Muscular Weakness;Back Problems;Arthritis;Right Hip Replacement;Shortness of Breath;Balance Concerns    Cardiac Risk Stratification  High           6 Minute Walk: 6 Minute Walk    6 Minute Walk    Row Name 10/13/17 1152 10/13/17 1218 10/13/17 1237   Phase  Initial  no documentation  Initial   Distance  907 feet  no documentation  907 feet   Walk Time  6 minutes  4.47 minutes  4.47 minutes   # of Rest Breaks  0  2  2   MPH  1.7  2.3  2.3   METS  1.6  1.7  1.7   RPE  11  11  11    Perceived Dyspnea   1  1  no documentation   VO2 Peak  5.7   5.9  2.9   Symptoms  Yes (comment)  Yes (comment)  Yes (comment)   Comments  pt took 2 short rest breaks, 1st break at 43 seconds and 2nd break at 30 seconds  pt took 2 short rest breaks, 1st break at 43 seconds and 2nd break at 30 seconds; mild SOB; dry mouth  pt took 2 short rest breaks, 1st break at 43 seconds and 2nd break at 30 seconds; mild SOB; dry mouth   Resting HR  64 bpm  no documentation  64 bpm   Resting BP  102/64  no documentation  102/64   Resting Oxygen Saturation   91 %  no documentation  91 %   Exercise Oxygen Saturation  during 6 min walk  93 %  no documentation  93 %   Max Ex. HR  83 bpm  no documentation  83 bpm   Max Ex. BP  148/60  no documentation  148/60   2 Minute Post BP  118/70  no documentation  118/70          Oxygen Initial Assessment:   Oxygen Re-Evaluation:   Oxygen Discharge (Final Oxygen Re-Evaluation):   Initial Exercise Prescription: Initial Exercise Prescription - 10/13/17 1200    Date of Initial Exercise RX and Referring Provider          Date  10/13/17    Referring Provider  Dorris Carnes MD        Recumbant Bike          Level  1    Minutes  15    METs  1        NuStep          Level  2    Minutes  15    METs  1.4        Track          Laps  8    Minutes  15    METs  2        Prescription Details          Frequency (times per week)  3    Duration  Progress to 30 minutes of continuous aerobic without signs/symptoms of physical distress        Intensity          THRR 40-80% of Max Heartrate  60-119    Ratings of Perceived Exertion  11-13    Perceived Dyspnea  0-4        Progression          Progression  Continue to progress workloads to maintain intensity without signs/symptoms of physical distress.        Resistance Training          Training Prescription  Yes    Weight  2lbs    Reps  10-15           Perform Capillary Blood Glucose checks as needed.  Exercise Prescription Changes: Exercise  Prescription Changes    Response to Exercise    Row Name 10/21/17 1608 11/02/17 1424   Blood Pressure (Admit)  120/60  112/72   Blood Pressure (Exercise)  130/70  132/70   Blood Pressure (Exit)  114/70  112/62   Heart Rate (Admit)  75 bpm  73 bpm   Heart Rate (Exercise)  92 bpm  85 bpm   Heart Rate (Exit)  74 bpm  73 bpm   Rating of Perceived Exertion (Exercise)  14  13   Symptoms  back pain  back pain   Duration  Progress to 30 minutes of  aerobic without signs/symptoms of physical distress  Progress to 30 minutes of  aerobic without signs/symptoms of physical distress  Intensity  THRR unchanged  THRR unchanged       Progression    Row Name 10/21/17 1608 11/02/17 1424   Progression  Continue to progress workloads to maintain intensity without signs/symptoms of physical distress.  Continue to progress workloads to maintain intensity without signs/symptoms of physical distress.   Average METs  no documentation  2.5       Resistance Training    Row Name 10/21/17 1608 11/02/17 1424   Training Prescription  No relaxation day  Yes   Weight  no documentation  2lbs   Reps  no documentation  10-15   Time  no documentation  [place] Name 10/21/17 1608 11/02/17 1424   Level  1  2   Minutes  15  15   METs  no documentation  2.6       Track    Row Name 10/21/17 1608 11/02/17 1424   Laps  14  13   Minutes  15  15   METs  2.62  2.51       Home Exercise Plan    Row Name 10/21/17 1608 11/02/17 1424   Plans to continue exercise at  no documentation  Home (comment) walking 3(10'bouts), chair exercises    Frequency  no documentation  Add 3 additional days to program exercise sessions.   Initial Home Exercises Provided  no documentation  11/03/17          Exercise Comments: Exercise Comments    Row Name 10/22/17 1609 11/04/17 1114   Exercise Comments  Pt responded well to first exercise session in cardiac rehab. Pt did c/o of back pain but was able  to continue with exercise. Will continue to monitor and progress WL's as tolerated.  Reviewed METs and goals. Pt is tolerating exercise well. Reviewed HEP on 11/02/17.       Exercise Goals and Review: Exercise Goals    Exercise Goals    Row Name 10/13/17 0903   Increase Physical Activity  Yes   Intervention  Provide advice, education, support and counseling about physical activity/exercise needs.;Develop an individualized exercise prescription for aerobic and resistive training based on initial evaluation findings, risk stratification, comorbidities and participant's personal goals.   Expected Outcomes  Short Term: Attend rehab on a regular basis to increase amount of physical activity.;Long Term: Exercising regularly at least 3-5 days a week.;Long Term: Add in home exercise to make exercise part of routine and to increase amount of physical activity.   Increase Strength and Stamina  Yes   Intervention  Provide advice, education, support and counseling about physical activity/exercise needs.;Develop an individualized exercise prescription for aerobic and resistive training based on initial evaluation findings, risk stratification, comorbidities and participant's personal goals.   Expected Outcomes  Short Term: Increase workloads from initial exercise prescription for resistance, speed, and METs.;Short Term: Perform resistance training exercises routinely during rehab and add in resistance training at home;Long Term: Improve cardiorespiratory fitness, muscular endurance and strength as measured by increased METs and functional capacity (6MWT)   Able to understand and use rate of perceived exertion (RPE) scale  Yes   Intervention  Provide education and explanation on how to use RPE scale   Expected Outcomes  Short Term: Able to use RPE daily in rehab to express subjective intensity level;Long Term:  Able to use RPE to guide intensity level when exercising independently   Able to understand and use  Dyspnea  scale  Yes   Intervention  Provide education and explanation on how to use Dyspnea scale   Expected Outcomes  Short Term: Able to use Dyspnea scale daily in rehab to express subjective sense of shortness of breath during exertion;Long Term: Able to use Dyspnea scale to guide intensity level when exercising independently   Knowledge and understanding of Target Heart Rate Range (THRR)  Yes   Intervention  Provide education and explanation of THRR including how the numbers were predicted and where they are located for reference   Expected Outcomes  Short Term: Able to state/look up THRR;Long Term: Able to use THRR to govern intensity when exercising independently;Short Term: Able to use daily as guideline for intensity in rehab   Able to check pulse independently  Yes   Intervention  Provide education and demonstration on how to check pulse in carotid and radial arteries.;Review the importance of being able to check your own pulse for safety during independent exercise   Expected Outcomes  Short Term: Able to explain why pulse checking is important during independent exercise;Long Term: Able to check pulse independently and accurately   Understanding of Exercise Prescription  Yes   Intervention  Provide education, explanation, and written materials on patient's individual exercise prescription   Expected Outcomes  Short Term: Able to explain program exercise prescription;Long Term: Able to explain home exercise prescription to exercise independently          Exercise Goals Re-Evaluation : Exercise Goals Re-Evaluation    Exercise Goal Re-Evaluation    Row Name 11/04/17 1115   Exercise Goals Review  Increase Physical Activity;Able to understand and use rate of perceived exertion (RPE) scale;Knowledge and understanding of Target Heart Rate Range (THRR);Understanding of Exercise Prescription;Increase Strength and Stamina;Able to check pulse independently   Comments  Pt is tolerating light  exercise in cardiac rehab. Pt is able to walk around walking track without an assistive device. Pt reported "lower extremity getting stronger and taking it day by day,"   Expected Outcomes  Pt will continue to improve in cardiorespiratory fitness by coming to cardiac rehab and being compliant with HEP           Discharge Exercise Prescription (Final Exercise Prescription Changes): Exercise Prescription Changes - 11/02/17 1424    Response to Exercise          Blood Pressure (Admit)  112/72    Blood Pressure (Exercise)  132/70    Blood Pressure (Exit)  112/62    Heart Rate (Admit)  73 bpm    Heart Rate (Exercise)  85 bpm    Heart Rate (Exit)  73 bpm    Rating of Perceived Exertion (Exercise)  13    Symptoms  back pain    Duration  Progress to 30 minutes of  aerobic without signs/symptoms of physical distress    Intensity  THRR unchanged        Progression          Progression  Continue to progress workloads to maintain intensity without signs/symptoms of physical distress.    Average METs  2.5        Resistance Training          Training Prescription  Yes    Weight  2lbs    Reps  10-15    Time  10 Minutes        Recumbant Bike          Level  2    Minutes  15  METs  2.6        Track          Laps  13    Minutes  15    METs  2.51        Home Exercise Plan          Plans to continue exercise at  Home (comment) walking 3(10'bouts), chair exercises     Frequency  Add 3 additional days to program exercise sessions.    Initial Home Exercises Provided  11/03/17           Nutrition:  Target Goals: Understanding of nutrition guidelines, daily intake of sodium 1500mg , cholesterol 200mg , calories 30% from fat and 7% or less from saturated fats, daily to have 5 or more servings of fruits and vegetables.  Biometrics: Pre Biometrics - 10/13/17 1221    Pre Biometrics          Height  5\' 2"  (1.575 m)    Weight  185 lb 3 oz (84 kg)    Waist Circumference  39.75  inches    Hip Circumference  48 inches    Waist to Hip Ratio  0.83 %    BMI (Calculated)  33.86    Triceps Skinfold  23 mm    % Body Fat  43.6 %    Grip Strength  31 kg    Flexibility  0 in LBP!    Single Leg Stand  0 seconds            Nutrition Therapy Plan and Nutrition Goals: Nutrition Therapy & Goals - 10/13/17 1207    Nutrition Therapy          Diet  Heart Healthy        Personal Nutrition Goals          Nutrition Goal  Wt loss of 1-2 lb/week to a wt loss goal of 6-24 lb at graduation from [place].         Intervention Plan          Intervention  Prescribe, educate and counsel regarding individualized specific dietary modifications aiming towards targeted core components such as weight, hypertension, lipid management, diabetes, heart failure and other comorbidities.    Expected Outcomes  Short Term Goal: Understand basic principles of dietary content, such as calories, fat, sodium, cholesterol and nutrients.;Long Term Goal: Adherence to prescribed nutrition plan.           Nutrition Assessments: Nutrition Assessments - 10/13/17 1207    MEDFICTS Scores          Pre Score  30           Nutrition Goals Re-Evaluation:   Nutrition Goals Re-Evaluation:   Nutrition Goals Discharge (Final Nutrition Goals Re-Evaluation):   Psychosocial: Target Goals: Acknowledge presence or absence of significant depression and/or stress, maximize coping skills, provide positive support system. Participant is able to verbalize types and ability to use techniques and skills needed for reducing stress and depression.  Initial Review & Psychosocial Screening: Initial Psych Review & Screening - 10/13/17 1152    Initial Review          Current issues with  None Identified        Family Dynamics          Good Support System?  Yes husband         Barriers          Psychosocial barriers to participate in program  The patient should benefit from training  in stress  management and relaxation.        Screening Interventions          Interventions  Encouraged to exercise    Expected Outcomes  Short Term goal: Identification and review with participant of any Quality of Life or Depression concerns found by scoring the questionnaire.;Long Term goal: The participant improves quality of Life and PHQ9 Scores as seen by post scores and/or verbalization of changes           Quality of Life Scores: Quality of Life - 10/30/17 1446    Quality of Life Scores          Health/Function Pre  18.17 % pt mostly concerned about dyspnea.      Socioeconomic Pre  28.94 %    Psych/Spiritual Pre  27.93 %    Family Pre  14.4 % pt concerned about family dynamic and role changes with her cardiac illness.  will plan home exercise instruction next week with family present to help clarify pt expectations, capabilities and limitations at this time.      GLOBAL Pre  22.04 % pt offered emotional support and reassurance. pt offered counseling with Jeanella Craze.  pt will let us know if she is interested in making appointment.            Scores of 19 and below usually indicate a poorer quality of life in these areas.  A difference of  2-3 points is a clinically meaningful difference.  A difference of 2-3 points in the total score of the Quality of Life Index has been associated with significant improvement in overall quality of life, self-image, physical symptoms, and general health in studies assessing change in quality of life.  PHQ-9: Recent Review Flowsheet Data    Depression screen [facility] 2/9 10/21/2017 01/24/2015 01/10/2014   Decreased Interest 1 1 0   Down, Depressed, Hopeless 3 1 0   PHQ - 2 Score 4 2 0   Altered sleeping 1 3 -   Tired, decreased energy 1 3 -   Change in appetite 0 0 -   Feeling bad or failure about yourself  0 0 -   Trouble concentrating 1 0 -   Moving slowly or fidgety/restless 0 0 -   Suicidal thoughts 0 0 -   PHQ-9 Score 7 8 -   Difficult doing  work/chores Somewhat difficult - -     Interpretation of Total Score  Total Score Depression Severity:  1-4 = Minimal depression, 5-9 = Mild depression, 10-14 = Moderate depression, 15-19 = Moderately severe depression, 20-27 = Severe depression   Psychosocial Evaluation and Intervention: Psychosocial Evaluation - 10/21/17 1503    Psychosocial Evaluation & Interventions          Interventions  Physician referral;Stress management education;Relaxation education;Encouraged to exercise with the program and follow exercise prescription    Comments  pt with known history of depression currently being treated by psychiatry.  pt feels current treatment is not effective. pt has previously scheduled appointment for montly followup. pt feels pressure from her family to improve her health and make lifestyle modifications to decrease RF.  pt enjoys shopping however is currenlty limited by back pain.      Expected Outcomes  pt will exhibit positive outlook with improved coping skills.     Continue Psychosocial Services   Follow up required by staff           Psychosocial Re-Evaluation: Psychosocial Re-Evaluation    Psychosocial Re-Evaluation  [place] Name 10/27/17 1359   Current issues with  Current Stress Concerns;Current Depression   Comments  pt with known history of depression currently being treated by psychiatry.  pt feels current treatment is not effective. pt has previously scheduled appointment for montly followup. pt feels pressure from her family to improve her health and make lifestyle modifications to decrease RF.  pt enjoys shopping however is currenlty limited by back pain.     Expected Outcomes  pt will exhibit positive outlook with good coping skills.    Interventions  Stress management education;Encouraged to attend Cardiac Rehabilitation for the exercise;Relaxation education   Continue Psychosocial Services   Follow up required by staff       Initial Review    Row Name 10/27/17  1359   Source of Stress Concerns  Chronic Illness;Poor Coping Skills          Psychosocial Discharge (Final Psychosocial Re-Evaluation): Psychosocial Re-Evaluation - 10/27/17 1359    Psychosocial Re-Evaluation          Current issues with  Current Stress Concerns;Current Depression    Comments  pt with known history of depression currently being treated by psychiatry.  pt feels current treatment is not effective. pt has previously scheduled appointment for montly followup. pt feels pressure from her family to improve her health and make lifestyle modifications to decrease RF.  pt enjoys shopping however is currenlty limited by back pain.      Expected Outcomes  pt will exhibit positive outlook with good coping skills.     Interventions  Stress management education;Encouraged to attend Cardiac Rehabilitation for the exercise;Relaxation education    Continue Psychosocial Services   Follow up required by staff        Initial Review          Source of Stress Concerns  Chronic Illness;Poor Coping Skills           Vocational Rehabilitation: Provide vocational rehab assistance to qualifying candidates.   Vocational Rehab Evaluation & Intervention: Vocational Rehab - 10/13/17 1237    Initial Vocational Rehab Evaluation & Intervention          Assessment shows need for Vocational Rehabilitation  No           Education: Education Goals: Education classes will be provided on a weekly basis, covering required topics. Participant will state understanding/return demonstration of topics presented.  Learning Barriers/Preferences: Learning Barriers/Preferences - 10/13/17 8413    Learning Barriers/Preferences          Learning Barriers  Sight    Learning Preferences  Video;Pictoral;Verbal Instruction;Skilled Demonstration;Written Material           Education Topics: Count Your Pulse:  -Group instruction provided by verbal instruction, demonstration, patient participation and  written materials to support subject.  Instructors address importance of being able to find your pulse and how to count your pulse when at home without a heart monitor.  Patients get hands on experience counting their pulse with staff help and individually.   Heart Attack, Angina, and Risk Factor Modification:  -Group instruction provided by verbal instruction, video, and written materials to support subject.  Instructors address signs and symptoms of angina and heart attacks.    Also discuss risk factors for heart disease and how to make changes to improve heart health risk factors.   Functional Fitness:  -Group instruction provided by verbal instruction, demonstration, patient participation, and written materials to support subject.  Instructors address safety measures for doing things around the  house.  Discuss how to get up and down off the floor, how to pick things up properly, how to safely get out of a chair without assistance, and balance training.   Meditation and Mindfulness:  -Group instruction provided by verbal instruction, patient participation, and written materials to support subject.  Instructor addresses importance of mindfulness and meditation practice to help reduce stress and improve awareness.  Instructor also leads participants through a meditation exercise.    Stretching for Flexibility and Mobility:  -Group instruction provided by verbal instruction, patient participation, and written materials to support subject.  Instructors lead participants through series of stretches that are designed to increase flexibility thus improving mobility.  These stretches are additional exercise for major muscle groups that are typically performed during regular warm up and cool down.   Hands Only CPR:  -Group verbal, video, and participation provides a basic overview of AHA guidelines for community CPR. Role-play of emergencies allow participants the opportunity to practice calling for  help and chest compression technique with discussion of AED use.   Hypertension: -Group verbal and written instruction that provides a basic overview of hypertension including the most recent diagnostic guidelines, risk factor reduction with self-care instructions and medication management.    Nutrition I class: Heart Healthy Eating:  -Group instruction provided by PowerPoint slides, verbal discussion, and written materials to support subject matter. The instructor gives an explanation and review of the Therapeutic Lifestyle Changes diet recommendations, which includes a discussion on lipid goals, dietary fat, sodium, fiber, plant stanol/sterol esters, sugar, and the components of a well-balanced, healthy diet. Flowsheet Row CARDIAC REHAB PHASE II ORIENTATION from 10/13/2017 in [place]  Date  10/13/17  Educator  RD      Nutrition II class: Lifestyle Skills:  -Group instruction provided by PowerPoint slides, verbal discussion, and written materials to support subject matter. The instructor gives an explanation and review of label reading, grocery shopping for heart health, heart healthy recipe modifications, and ways to make healthier choices when eating out. Flowsheet Row CARDIAC REHAB PHASE II ORIENTATION from 10/13/2017 in [place]  Date  10/13/17  Educator  RD      Diabetes Question & Answer:  -Group instruction provided by PowerPoint slides, verbal discussion, and written materials to support subject matter. The instructor gives an explanation and review of diabetes co-morbidities, pre- and post-prandial blood glucose goals, pre-exercise blood glucose goals, signs, symptoms, and treatment of hypoglycemia and hyperglycemia, and foot care basics.   Diabetes Blitz:  -Group instruction provided by PowerPoint slides, verbal discussion, and written materials to support subject matter. The instructor gives an explanation and  review of the physiology behind type 1 and type 2 diabetes, diabetes medications and rational behind using different medications, pre- and post-prandial blood glucose recommendations and Hemoglobin A1c goals, diabetes diet, and exercise including blood glucose guidelines for exercising safely.    Portion Distortion:  -Group instruction provided by PowerPoint slides, verbal discussion, written materials, and food models to support subject matter. The instructor gives an explanation of serving size versus portion size, changes in portions sizes over the last 20 years, and what consists of a serving from each food group.   Stress Management:  -Group instruction provided by verbal instruction, video, and written materials to support subject matter.  Instructors review role of stress in heart disease and how to cope with stress positively.     Exercising on Your Own:  -Group instruction provided by verbal  instruction, power point, and written materials to support subject.  Instructors discuss benefits of exercise, components of exercise, frequency and intensity of exercise, and end points for exercise.  Also discuss use of nitroglycerin and activating EMS.  Review options of places to exercise outside of rehab.  Review guidelines for sex with heart disease.   Cardiac Drugs I:  -Group instruction provided by verbal instruction and written materials to support subject.  Instructor reviews cardiac drug classes: antiplatelets, anticoagulants, beta blockers, and statins.  Instructor discusses reasons, side effects, and lifestyle considerations for each drug class.   Cardiac Drugs II:  -Group instruction provided by verbal instruction and written materials to support subject.  Instructor reviews cardiac drug classes: angiotensin converting enzyme inhibitors (ACE-I), angiotensin II receptor blockers (ARBs), nitrates, and calcium channel blockers.  Instructor discusses reasons, side effects, and lifestyle  considerations for each drug class.   Anatomy and Physiology of the Circulatory System:  Group verbal and written instruction and models provide basic cardiac anatomy and physiology, with the coronary electrical and arterial systems. Review of: AMI, Angina, Valve disease, Heart Failure, Peripheral Artery Disease, Cardiac Arrhythmia, Pacemakers, and the ICD.   Other Education:  -Group or individual verbal, written, or video instructions that support the educational goals of the cardiac rehab program.   Holiday Eating Survival Tips:  -Group instruction provided by PowerPoint slides, verbal discussion, and written materials to support subject matter. The instructor gives patients tips, tricks, and techniques to help them not only survive but enjoy the holidays despite the onslaught of food that accompanies the holidays.   Knowledge Questionnaire Score: Knowledge Questionnaire Score - 10/13/17 1152    Knowledge Questionnaire Score          Pre Score  16/24           Core Components/Risk Factors/Patient Goals at Admission: Personal Goals and Risk Factors at Admission - 10/13/17 1215    Core Components/Risk Factors/Patient Goals on Admission           Weight Management  Yes;Obesity;Weight Maintenance;Weight Loss    Intervention  Weight Management: Develop a combined nutrition and exercise program designed to reach desired caloric intake, while maintaining appropriate intake of nutrient and fiber, sodium and fats, and appropriate energy expenditure required for the weight goal.;Weight Management: Provide education and appropriate resources to help participant work on and attain dietary goals.;Weight Management/Obesity: Establish reasonable short term and long term weight goals.;Obesity: Provide education and appropriate resources to help participant work on and attain dietary goals.    Admit Weight  185 lb 3 oz (84 kg)    Goal Weight: Short Term  180 lb (81.6 kg)    Goal Weight: Long Term   165 lb (74.8 kg)    Expected Outcomes  Short Term: Continue to assess and modify interventions until short term weight is achieved;Long Term: Adherence to nutrition and physical activity/exercise program aimed toward attainment of established weight goal;Weight Maintenance: Understanding of the daily nutrition guidelines, which includes 25-35% calories from fat, 7% or less cal from saturated fats, less than 200mg  cholesterol, less than 1.5gm of sodium, & 5 or more servings of fruits and vegetables daily;Weight Loss: Understanding of general recommendations for a balanced deficit meal plan, which promotes 1-2 lb weight loss per week and includes a negative energy balance of [phone] kcal/d;Understanding recommendations for meals to include 15-35% energy as protein, 25-35% energy from fat, 35-60% energy from carbohydrates, less than 200mg  of dietary cholesterol, 20-35 gm of total fiber daily;Understanding of distribution  of calorie intake throughout the day with the consumption of 4-5 meals/snacks    Improve shortness of breath with ADL's  Yes    Intervention  Provide education, individualized exercise plan and daily activity instruction to help decrease symptoms of SOB with activities of daily living.    Expected Outcomes  Short Term: Improve cardiorespiratory fitness to achieve a reduction of symptoms when performing ADLs;Long Term: Be able to perform more ADLs without symptoms or delay the onset of symptoms    Hypertension  Yes    Intervention  Provide education on lifestyle modifcations including regular physical activity/exercise, weight management, moderate sodium restriction and increased consumption of fresh fruit, vegetables, and low fat dairy, alcohol moderation, and smoking cessation.;Monitor prescription use compliance.    Expected Outcomes  Short Term: Continued assessment and intervention until BP is < 140/48mm HG in hypertensive participants. < 130/51mm HG in hypertensive participants with  diabetes, heart failure or chronic kidney disease.;Long Term: Maintenance of blood pressure at goal levels.    Lipids  Yes    Intervention  Provide education and support for participant on nutrition & aerobic/resistive exercise along with prescribed medications to achieve LDL 70mg , HDL >40mg .    Expected Outcomes  Short Term: Participant states understanding of desired cholesterol values and is compliant with medications prescribed. Participant is following exercise prescription and nutrition guidelines.;Long Term: Cholesterol controlled with medications as prescribed, with individualized exercise RX and with personalized nutrition plan. Value goals: LDL < 70mg , HDL > 40 mg.    Stress  Yes    Intervention  Offer individual and/or small group education and counseling on adjustment to heart disease, stress management and health-related lifestyle change. Teach and support self-help strategies.;Refer participants experiencing significant psychosocial distress to appropriate mental health specialists for further evaluation and treatment. When possible, include family members and significant others in education/counseling sessions.    Expected Outcomes  Short Term: Participant demonstrates changes in health-related behavior, relaxation and other stress management skills, ability to obtain effective social support, and compliance with psychotropic medications if prescribed.;Long Term: Emotional wellbeing is indicated by absence of clinically significant psychosocial distress or social isolation.           Core Components/Risk Factors/Patient Goals Review:  Goals and Risk Factor Review    Core Components/Risk Factors/Patient Goals Review    Row Name 10/21/17 1501 10/27/17 1359   Personal Goals Review  Weight Management/Obesity;Improve shortness of breath with ADL's;Hypertension;Lipids;Stress  Weight Management/Obesity;Improve shortness of breath with ADL's;Hypertension;Lipids;Stress   Review  pt with  multiple CAD RF demonstrates willingness to participate in CR program. pt personal goals are to decrease dyspnea and increase physical activity/strength/stamina.   pt with multiple CAD RF demonstrates willingness to participate in CR program. pt personal goals are to decrease dyspnea and increase physical activity/strength/stamina. pt reports her daughter has been advocate for her participating in home exercise.     Expected Outcomes  pt will participate in CR exercise, nutrition and lifestyle modification activities to decrease overall RF.   pt will participate in CR exercise, nutrition and lifestyle modification activities to decrease overall RF.           Core Components/Risk Factors/Patient Goals at Discharge (Final Review):  Goals and Risk Factor Review - 10/27/17 1359    Core Components/Risk Factors/Patient Goals Review          Personal Goals Review  Weight Management/Obesity;Improve shortness of breath with ADL's;Hypertension;Lipids;Stress    Review  pt with multiple CAD RF demonstrates willingness to participate in  CR program. pt personal goals are to decrease dyspnea and increase physical activity/strength/stamina. pt reports her daughter has been advocate for her participating in home exercise.      Expected Outcomes  pt will participate in CR exercise, nutrition and lifestyle modification activities to decrease overall RF.            ITP Comments: ITP Comments    Row Name 10/13/17 0901 10/21/17 1456 10/27/17 1353   ITP Comments  Medical Director, Dr. Fransico Him  pt started group exercise sessions. pt tolerated light activity utilizing rollator for walking.    30 day ITP review.  pt with good attendance and participation.        Comments:

## 2017-11-05 NOTE — Telephone Encounter (Signed)
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 Will route to Dr. Tamala Julian for input on holding plavix and Aspirin for procedure.   Dr. Tamala Julian please route recommendations back to preop pool.

## 2017-11-06 ENCOUNTER — Encounter (HOSPITAL_COMMUNITY)
Admission: RE | Admit: 2017-11-06 | Discharge: 2017-11-06 | Disposition: A | Discharge status: Home / Self Care | Payer: BLUE CROSS/BLUE SHIELD | Source: Ambulatory Visit | Attending: Internal Medicine | Admitting: Internal Medicine

## 2017-11-06 DIAGNOSIS — D229 Melanocytic nevi, unspecified: Secondary | ICD-10-CM | POA: Diagnosis not present

## 2017-11-06 DIAGNOSIS — L570 Actinic keratosis: Secondary | ICD-10-CM | POA: Diagnosis not present

## 2017-11-06 DIAGNOSIS — E785 Hyperlipidemia, unspecified: Secondary | ICD-10-CM | POA: Diagnosis not present

## 2017-11-06 DIAGNOSIS — L814 Other melanin hyperpigmentation: Secondary | ICD-10-CM | POA: Diagnosis not present

## 2017-11-06 DIAGNOSIS — I2510 Atherosclerotic heart disease of native coronary artery without angina pectoris: Secondary | ICD-10-CM | POA: Diagnosis not present

## 2017-11-06 DIAGNOSIS — F329 Major depressive disorder, single episode, unspecified: Secondary | ICD-10-CM | POA: Diagnosis not present

## 2017-11-06 DIAGNOSIS — L821 Other seborrheic keratosis: Secondary | ICD-10-CM | POA: Diagnosis not present

## 2017-11-06 DIAGNOSIS — I252 Old myocardial infarction: Secondary | ICD-10-CM | POA: Diagnosis not present

## 2017-11-06 DIAGNOSIS — Z955 Presence of coronary angioplasty implant and graft: Secondary | ICD-10-CM | POA: Diagnosis not present

## 2017-11-06 DIAGNOSIS — Z87891 Personal history of nicotine dependence: Secondary | ICD-10-CM | POA: Diagnosis not present

## 2017-11-06 DIAGNOSIS — Z951 Presence of aortocoronary bypass graft: Secondary | ICD-10-CM

## 2017-11-06 DIAGNOSIS — M1990 Unspecified osteoarthritis, unspecified site: Secondary | ICD-10-CM | POA: Diagnosis not present

## 2017-11-06 DIAGNOSIS — K219 Gastro-esophageal reflux disease without esophagitis: Secondary | ICD-10-CM | POA: Diagnosis not present

## 2017-11-06 DIAGNOSIS — Z7902 Long term (current) use of antithrombotics/antiplatelets: Secondary | ICD-10-CM | POA: Diagnosis not present

## 2017-11-06 DIAGNOSIS — Z79899 Other long term (current) drug therapy: Secondary | ICD-10-CM | POA: Diagnosis not present

## 2017-11-06 NOTE — Telephone Encounter (Signed)
Okay to hold Plavix for 5-7 days.  Reluctant to hold aspirin as she is only 5 months post coronary bypass grafting.  This will increase the risk of graft occlusion.  But if patient is having significant discomfort and it is necessary to perform this procedure, hold aspirin 5 days before the procedure and resume as soon as possible.  Plavix should be resumed as soon as possible.

## 2017-11-09 ENCOUNTER — Encounter (HOSPITAL_COMMUNITY)
Admission: RE | Admit: 2017-11-09 | Discharge: 2017-11-09 | Disposition: A | Discharge status: Home / Self Care | Payer: BLUE CROSS/BLUE SHIELD | Source: Ambulatory Visit | Attending: Internal Medicine | Admitting: Internal Medicine

## 2017-11-09 DIAGNOSIS — I252 Old myocardial infarction: Secondary | ICD-10-CM | POA: Diagnosis not present

## 2017-11-09 DIAGNOSIS — E785 Hyperlipidemia, unspecified: Secondary | ICD-10-CM | POA: Diagnosis not present

## 2017-11-09 DIAGNOSIS — Z79899 Other long term (current) drug therapy: Secondary | ICD-10-CM | POA: Diagnosis not present

## 2017-11-09 DIAGNOSIS — M1990 Unspecified osteoarthritis, unspecified site: Secondary | ICD-10-CM | POA: Diagnosis not present

## 2017-11-09 DIAGNOSIS — Z955 Presence of coronary angioplasty implant and graft: Secondary | ICD-10-CM | POA: Diagnosis not present

## 2017-11-09 DIAGNOSIS — Z7902 Long term (current) use of antithrombotics/antiplatelets: Secondary | ICD-10-CM | POA: Diagnosis not present

## 2017-11-09 DIAGNOSIS — Z951 Presence of aortocoronary bypass graft: Secondary | ICD-10-CM | POA: Diagnosis not present

## 2017-11-09 DIAGNOSIS — I2510 Atherosclerotic heart disease of native coronary artery without angina pectoris: Secondary | ICD-10-CM | POA: Diagnosis not present

## 2017-11-09 DIAGNOSIS — F329 Major depressive disorder, single episode, unspecified: Secondary | ICD-10-CM | POA: Diagnosis not present

## 2017-11-09 DIAGNOSIS — Z87891 Personal history of nicotine dependence: Secondary | ICD-10-CM | POA: Diagnosis not present

## 2017-11-09 DIAGNOSIS — K219 Gastro-esophageal reflux disease without esophagitis: Secondary | ICD-10-CM | POA: Diagnosis not present

## 2017-11-09 NOTE — Telephone Encounter (Signed)
   I will route this recommendation to the requesting party via Epic fax function and remove from pre-op pool.  Please call with questions.  [place], [place] 11/09/2017, 1:22 PM

## 2017-11-10 ENCOUNTER — Telehealth (HOSPITAL_COMMUNITY): Payer: Self-pay | Admitting: Adult Health

## 2017-11-11 ENCOUNTER — Other Ambulatory Visit: Payer: Self-pay | Admitting: Neurosurgery

## 2017-11-11 ENCOUNTER — Encounter (HOSPITAL_COMMUNITY): Payer: BLUE CROSS/BLUE SHIELD

## 2017-11-11 DIAGNOSIS — Z981 Arthrodesis status: Secondary | ICD-10-CM

## 2017-11-13 ENCOUNTER — Encounter (HOSPITAL_COMMUNITY)
Admission: RE | Admit: 2017-11-13 | Discharge: 2017-11-13 | Disposition: A | Discharge status: Home / Self Care | Payer: BLUE CROSS/BLUE SHIELD | Source: Ambulatory Visit | Attending: Internal Medicine | Admitting: Internal Medicine

## 2017-11-13 DIAGNOSIS — Z7902 Long term (current) use of antithrombotics/antiplatelets: Secondary | ICD-10-CM | POA: Diagnosis not present

## 2017-11-13 DIAGNOSIS — Z955 Presence of coronary angioplasty implant and graft: Secondary | ICD-10-CM | POA: Diagnosis not present

## 2017-11-13 DIAGNOSIS — Z951 Presence of aortocoronary bypass graft: Secondary | ICD-10-CM

## 2017-11-13 DIAGNOSIS — I252 Old myocardial infarction: Secondary | ICD-10-CM | POA: Diagnosis not present

## 2017-11-13 DIAGNOSIS — Z79899 Other long term (current) drug therapy: Secondary | ICD-10-CM | POA: Diagnosis not present

## 2017-11-13 DIAGNOSIS — F329 Major depressive disorder, single episode, unspecified: Secondary | ICD-10-CM | POA: Diagnosis not present

## 2017-11-13 DIAGNOSIS — Z87891 Personal history of nicotine dependence: Secondary | ICD-10-CM | POA: Diagnosis not present

## 2017-11-13 DIAGNOSIS — I2510 Atherosclerotic heart disease of native coronary artery without angina pectoris: Secondary | ICD-10-CM | POA: Diagnosis not present

## 2017-11-13 DIAGNOSIS — K219 Gastro-esophageal reflux disease without esophagitis: Secondary | ICD-10-CM | POA: Diagnosis not present

## 2017-11-13 DIAGNOSIS — M1990 Unspecified osteoarthritis, unspecified site: Secondary | ICD-10-CM | POA: Diagnosis not present

## 2017-11-13 DIAGNOSIS — E785 Hyperlipidemia, unspecified: Secondary | ICD-10-CM | POA: Diagnosis not present

## 2017-11-16 ENCOUNTER — Encounter (HOSPITAL_COMMUNITY)
Admission: RE | Admit: 2017-11-16 | Discharge: 2017-11-16 | Disposition: A | Discharge status: Home / Self Care | Payer: BLUE CROSS/BLUE SHIELD | Source: Ambulatory Visit | Attending: Internal Medicine | Admitting: Internal Medicine

## 2017-11-16 DIAGNOSIS — Z79899 Other long term (current) drug therapy: Secondary | ICD-10-CM | POA: Diagnosis not present

## 2017-11-16 DIAGNOSIS — Z955 Presence of coronary angioplasty implant and graft: Secondary | ICD-10-CM | POA: Insufficient documentation

## 2017-11-16 DIAGNOSIS — M1990 Unspecified osteoarthritis, unspecified site: Secondary | ICD-10-CM | POA: Insufficient documentation

## 2017-11-16 DIAGNOSIS — Z951 Presence of aortocoronary bypass graft: Secondary | ICD-10-CM | POA: Diagnosis not present

## 2017-11-16 DIAGNOSIS — I252 Old myocardial infarction: Secondary | ICD-10-CM | POA: Insufficient documentation

## 2017-11-16 DIAGNOSIS — Z7902 Long term (current) use of antithrombotics/antiplatelets: Secondary | ICD-10-CM | POA: Diagnosis not present

## 2017-11-16 DIAGNOSIS — K219 Gastro-esophageal reflux disease without esophagitis: Secondary | ICD-10-CM | POA: Diagnosis not present

## 2017-11-16 DIAGNOSIS — Z87891 Personal history of nicotine dependence: Secondary | ICD-10-CM | POA: Insufficient documentation

## 2017-11-16 DIAGNOSIS — E785 Hyperlipidemia, unspecified: Secondary | ICD-10-CM | POA: Insufficient documentation

## 2017-11-16 DIAGNOSIS — F329 Major depressive disorder, single episode, unspecified: Secondary | ICD-10-CM | POA: Insufficient documentation

## 2017-11-16 DIAGNOSIS — I2510 Atherosclerotic heart disease of native coronary artery without angina pectoris: Secondary | ICD-10-CM | POA: Insufficient documentation

## 2017-11-17 ENCOUNTER — Ambulatory Visit
Admission: RE | Admit: 2017-11-17 | Discharge: 2017-11-17 | Disposition: A | Discharge status: Home / Self Care | Payer: BLUE CROSS/BLUE SHIELD | Source: Ambulatory Visit | Attending: Neurosurgery | Admitting: Neurosurgery

## 2017-11-17 DIAGNOSIS — M545 Low back pain: Secondary | ICD-10-CM | POA: Diagnosis not present

## 2017-11-17 DIAGNOSIS — Z981 Arthrodesis status: Secondary | ICD-10-CM

## 2017-11-18 ENCOUNTER — Encounter (HOSPITAL_COMMUNITY)
Admission: RE | Admit: 2017-11-18 | Discharge: 2017-11-18 | Disposition: A | Discharge status: Home / Self Care | Payer: BLUE CROSS/BLUE SHIELD | Source: Ambulatory Visit | Attending: Internal Medicine | Admitting: Internal Medicine

## 2017-11-18 DIAGNOSIS — M48062 Spinal stenosis, lumbar region with neurogenic claudication: Secondary | ICD-10-CM | POA: Diagnosis not present

## 2017-11-18 DIAGNOSIS — M48061 Spinal stenosis, lumbar region without neurogenic claudication: Secondary | ICD-10-CM | POA: Diagnosis not present

## 2017-11-19 ENCOUNTER — Telehealth (HOSPITAL_COMMUNITY): Payer: Self-pay

## 2017-11-19 NOTE — Telephone Encounter (Signed)
Patient called and stated she will not be attending class tomorrow at 1:15 due to a dr appt to get more tests done.

## 2017-11-20 ENCOUNTER — Encounter (HOSPITAL_COMMUNITY): Payer: BLUE CROSS/BLUE SHIELD

## 2017-11-23 ENCOUNTER — Encounter (HOSPITAL_COMMUNITY)
Admission: RE | Admit: 2017-11-23 | Discharge: 2017-11-23 | Disposition: A | Discharge status: Home / Self Care | Payer: BLUE CROSS/BLUE SHIELD | Source: Ambulatory Visit | Attending: Internal Medicine | Admitting: Internal Medicine

## 2017-11-23 DIAGNOSIS — E785 Hyperlipidemia, unspecified: Secondary | ICD-10-CM | POA: Diagnosis not present

## 2017-11-23 DIAGNOSIS — I252 Old myocardial infarction: Secondary | ICD-10-CM | POA: Diagnosis not present

## 2017-11-23 DIAGNOSIS — Z951 Presence of aortocoronary bypass graft: Secondary | ICD-10-CM | POA: Diagnosis not present

## 2017-11-23 DIAGNOSIS — Z7902 Long term (current) use of antithrombotics/antiplatelets: Secondary | ICD-10-CM | POA: Diagnosis not present

## 2017-11-23 DIAGNOSIS — Z955 Presence of coronary angioplasty implant and graft: Secondary | ICD-10-CM | POA: Diagnosis not present

## 2017-11-23 DIAGNOSIS — M1990 Unspecified osteoarthritis, unspecified site: Secondary | ICD-10-CM | POA: Diagnosis not present

## 2017-11-23 DIAGNOSIS — Z87891 Personal history of nicotine dependence: Secondary | ICD-10-CM | POA: Diagnosis not present

## 2017-11-23 DIAGNOSIS — Z79899 Other long term (current) drug therapy: Secondary | ICD-10-CM | POA: Diagnosis not present

## 2017-11-23 DIAGNOSIS — F329 Major depressive disorder, single episode, unspecified: Secondary | ICD-10-CM | POA: Diagnosis not present

## 2017-11-23 DIAGNOSIS — I2510 Atherosclerotic heart disease of native coronary artery without angina pectoris: Secondary | ICD-10-CM | POA: Diagnosis not present

## 2017-11-23 DIAGNOSIS — K219 Gastro-esophageal reflux disease without esophagitis: Secondary | ICD-10-CM | POA: Diagnosis not present

## 2017-11-25 ENCOUNTER — Encounter (HOSPITAL_COMMUNITY)
Admission: RE | Admit: 2017-11-25 | Discharge: 2017-11-25 | Disposition: A | Discharge status: Home / Self Care | Payer: BLUE CROSS/BLUE SHIELD | Source: Ambulatory Visit | Attending: Internal Medicine | Admitting: Internal Medicine

## 2017-11-25 DIAGNOSIS — Z7902 Long term (current) use of antithrombotics/antiplatelets: Secondary | ICD-10-CM | POA: Diagnosis not present

## 2017-11-25 DIAGNOSIS — Z955 Presence of coronary angioplasty implant and graft: Secondary | ICD-10-CM | POA: Diagnosis not present

## 2017-11-25 DIAGNOSIS — I2510 Atherosclerotic heart disease of native coronary artery without angina pectoris: Secondary | ICD-10-CM | POA: Diagnosis not present

## 2017-11-25 DIAGNOSIS — I252 Old myocardial infarction: Secondary | ICD-10-CM | POA: Diagnosis not present

## 2017-11-25 DIAGNOSIS — K219 Gastro-esophageal reflux disease without esophagitis: Secondary | ICD-10-CM | POA: Diagnosis not present

## 2017-11-25 DIAGNOSIS — Z951 Presence of aortocoronary bypass graft: Secondary | ICD-10-CM

## 2017-11-25 DIAGNOSIS — Z87891 Personal history of nicotine dependence: Secondary | ICD-10-CM | POA: Diagnosis not present

## 2017-11-25 DIAGNOSIS — F329 Major depressive disorder, single episode, unspecified: Secondary | ICD-10-CM | POA: Diagnosis not present

## 2017-11-25 DIAGNOSIS — E785 Hyperlipidemia, unspecified: Secondary | ICD-10-CM | POA: Diagnosis not present

## 2017-11-25 DIAGNOSIS — M1990 Unspecified osteoarthritis, unspecified site: Secondary | ICD-10-CM | POA: Diagnosis not present

## 2017-11-25 DIAGNOSIS — Z79899 Other long term (current) drug therapy: Secondary | ICD-10-CM | POA: Diagnosis not present

## 2017-11-25 NOTE — Progress Notes (Signed)
Bianca [name] 72 y.o. female DOB: 11-Sep-1945 MRN: [id number]      Nutrition Note  Dx: s/p CABG x 3   Current tobacco use? Yes, pt reports that she is still smoking on occasion, but has cut back significantly.    Labs:  Lipid Panel     Component Value Date/Time   CHOL 233 (H) 10/31/2016 0915   TRIG 316.0 (H) 10/31/2016 0915   HDL 48.80 10/31/2016 0915   CHOLHDL 5 10/31/2016 0915   VLDL 63.2 (H) 10/31/2016 0915   LDLCALC 106 (H) 01/03/2014 0939   LDLDIRECT 129.0 10/31/2016 0915   Nutrition Note Spoke with pt. Nutrition plan and goals reviewed. Pt states that she feels she is doing pretty well with her diet and was receptive to suggestions. Pt expressed understanding of the information reviewed. Pt aware of nutrition education classes offered but it is unlikely she will attend, was previously given the handouts for the Nutrition I class and the Nutrition II class. States she had no questions about the handouts.  Nutrition Diagnosis ? Food-and nutrition-related knowledge deficit related to lack of exposure to information as related to diagnosis of: ? CVD ? Pre-DM ? Obesity related to excessive energy intake as evidenced by a BMI of 34.1  Nutrition Intervention ? Pt's individual nutrition plan and goals reviewed with pt.  Nutrition Goal(s):  ? Pt to identify and limit food sources of saturated fat, trans fat, and sodium ? Pt to identify food quantities necessary to achieve weight loss of 6-24 lb at graduation from cardiac rehab.  Plan:  Pt to attend nutrition classes ? Portion Distortion  Will provide client-centered nutrition education as part of interdisciplinary care.   Monitor and evaluate progress toward nutrition goal with team.  Ranell Patrick, Dietetic Intern 11/25/2017 2:14 PM

## 2017-11-27 ENCOUNTER — Encounter (HOSPITAL_COMMUNITY)
Admission: RE | Admit: 2017-11-27 | Discharge: 2017-11-27 | Disposition: A | Discharge status: Home / Self Care | Payer: BLUE CROSS/BLUE SHIELD | Source: Ambulatory Visit | Attending: Internal Medicine | Admitting: Internal Medicine

## 2017-11-27 DIAGNOSIS — M1990 Unspecified osteoarthritis, unspecified site: Secondary | ICD-10-CM | POA: Diagnosis not present

## 2017-11-27 DIAGNOSIS — E785 Hyperlipidemia, unspecified: Secondary | ICD-10-CM | POA: Diagnosis not present

## 2017-11-27 DIAGNOSIS — Z79899 Other long term (current) drug therapy: Secondary | ICD-10-CM | POA: Diagnosis not present

## 2017-11-27 DIAGNOSIS — Z951 Presence of aortocoronary bypass graft: Secondary | ICD-10-CM | POA: Diagnosis not present

## 2017-11-27 DIAGNOSIS — I252 Old myocardial infarction: Secondary | ICD-10-CM | POA: Diagnosis not present

## 2017-11-27 DIAGNOSIS — Z955 Presence of coronary angioplasty implant and graft: Secondary | ICD-10-CM | POA: Diagnosis not present

## 2017-11-27 DIAGNOSIS — I2510 Atherosclerotic heart disease of native coronary artery without angina pectoris: Secondary | ICD-10-CM | POA: Diagnosis not present

## 2017-11-27 DIAGNOSIS — K219 Gastro-esophageal reflux disease without esophagitis: Secondary | ICD-10-CM | POA: Diagnosis not present

## 2017-11-27 DIAGNOSIS — Z7902 Long term (current) use of antithrombotics/antiplatelets: Secondary | ICD-10-CM | POA: Diagnosis not present

## 2017-11-27 DIAGNOSIS — Z87891 Personal history of nicotine dependence: Secondary | ICD-10-CM | POA: Diagnosis not present

## 2017-11-27 DIAGNOSIS — F329 Major depressive disorder, single episode, unspecified: Secondary | ICD-10-CM | POA: Diagnosis not present

## 2017-11-30 ENCOUNTER — Encounter (HOSPITAL_COMMUNITY)
Admission: RE | Admit: 2017-11-30 | Discharge: 2017-11-30 | Disposition: A | Discharge status: Home / Self Care | Payer: BLUE CROSS/BLUE SHIELD | Source: Ambulatory Visit | Attending: Internal Medicine | Admitting: Internal Medicine

## 2017-11-30 DIAGNOSIS — I2510 Atherosclerotic heart disease of native coronary artery without angina pectoris: Secondary | ICD-10-CM | POA: Diagnosis not present

## 2017-11-30 DIAGNOSIS — Z87891 Personal history of nicotine dependence: Secondary | ICD-10-CM | POA: Diagnosis not present

## 2017-11-30 DIAGNOSIS — I252 Old myocardial infarction: Secondary | ICD-10-CM | POA: Diagnosis not present

## 2017-11-30 DIAGNOSIS — M1990 Unspecified osteoarthritis, unspecified site: Secondary | ICD-10-CM | POA: Diagnosis not present

## 2017-11-30 DIAGNOSIS — Z79899 Other long term (current) drug therapy: Secondary | ICD-10-CM | POA: Diagnosis not present

## 2017-11-30 DIAGNOSIS — Z951 Presence of aortocoronary bypass graft: Secondary | ICD-10-CM

## 2017-11-30 DIAGNOSIS — Z7902 Long term (current) use of antithrombotics/antiplatelets: Secondary | ICD-10-CM | POA: Diagnosis not present

## 2017-11-30 DIAGNOSIS — E785 Hyperlipidemia, unspecified: Secondary | ICD-10-CM | POA: Diagnosis not present

## 2017-11-30 DIAGNOSIS — F329 Major depressive disorder, single episode, unspecified: Secondary | ICD-10-CM | POA: Diagnosis not present

## 2017-11-30 DIAGNOSIS — K219 Gastro-esophageal reflux disease without esophagitis: Secondary | ICD-10-CM | POA: Diagnosis not present

## 2017-11-30 DIAGNOSIS — Z955 Presence of coronary angioplasty implant and graft: Secondary | ICD-10-CM | POA: Diagnosis not present

## 2017-12-02 ENCOUNTER — Encounter (HOSPITAL_COMMUNITY)
Admission: RE | Admit: 2017-12-02 | Discharge: 2017-12-02 | Disposition: A | Discharge status: Home / Self Care | Payer: BLUE CROSS/BLUE SHIELD | Source: Ambulatory Visit | Attending: Internal Medicine | Admitting: Internal Medicine

## 2017-12-02 ENCOUNTER — Telehealth (HOSPITAL_COMMUNITY): Payer: Self-pay | Admitting: Cardiac Rehabilitation

## 2017-12-02 DIAGNOSIS — Z951 Presence of aortocoronary bypass graft: Secondary | ICD-10-CM

## 2017-12-02 NOTE — Telephone Encounter (Signed)
pc to PCP [name], NP to schedule follow up appt for depression/anxiety, throat congestion and reflux symptoms.  appt 12/03/2017 @ 2:30.  Pt made aware to arrive at 2:15pm.  Pt instructed to take dexilant as directed for her reflux symptoms.  Pt verbalized understanding.  Andi Hence, RN, BSN Cardiac Pulmonary Rehab

## 2017-12-02 NOTE — Progress Notes (Signed)
OUTPATIENT CARDIAC REHAB  PMH:  05/26/2017 CABGx3  Primary Cardiologist:  Dr. Harrington Challenger  Pt arrived at cardiac rehab tearful and afraid, c/o throat tightness with reflux symptoms.  Pt states "I'm crying hysterically for no reason."  Pt states she has felt this symptom for past 2 days. Pt daughter concerned that pt is not following strict pre-diabetic, heart healthy diet.  Pt dtr also admits frustration and anger with her mother yesterday after finding out her mother smoked a cigarette.  Pt  upper and lower body equal strength, movement, no facial droop or difficulty swallowing. Pt acute  symptoms relieved with reassurance. Although pt also c/o chronic reflux symptoms.  Pt had not eaten today due to these symptoms, however has taken her medication.  Pt given muffin, graham crackers and banana.  Conversation held with pt and dtr about psychosocial needs.  Pt admits to needing help with her health related anxiety.  However, Pt dtr concerned pt does not keep psychologist appointments and feels psychiatrist "only prescribes pills".  Counseling appt scheduled with Jeanella Craze 12/14/2017 @2 :45.  Pt and dtr report pt has previously scheduled appt next week with neuro and psychiatry.  Offered to take pt to ED for assessment, contact PCP, psych or neuro for pt.  Pt and dtr  declined all of the above.  Pt dtr offered counseling overcome caregiver fatigue, stress and anxiety from burnout.   Pt dtr declined need for assistance at this time.    Suggestion made to have family sessions with Jeanella Craze once established.  Pt left via wheelchair with dtr in stable condition.   Andi Hence, RN, BSN Cardiac Pulmonary Rehab 12/02/17 3:25 PM

## 2017-12-03 ENCOUNTER — Ambulatory Visit: Payer: BLUE CROSS/BLUE SHIELD | Admitting: Adult Health

## 2017-12-03 ENCOUNTER — Encounter: Payer: Self-pay | Admitting: Adult Health

## 2017-12-03 VITALS — BP 140/78 | Temp 97.8°F | Wt 173.0 lb

## 2017-12-03 DIAGNOSIS — F410 Panic disorder [episodic paroxysmal anxiety] without agoraphobia: Secondary | ICD-10-CM

## 2017-12-03 DIAGNOSIS — F419 Anxiety disorder, unspecified: Secondary | ICD-10-CM

## 2017-12-03 MED ORDER — ALPRAZOLAM 0.25 MG PO TABS: 0.2500 mg | ORAL_TABLET | Freq: Two times a day (BID) | ORAL | 0 refills | Status: DC | PRN

## 2017-12-03 NOTE — Progress Notes (Signed)
Subjective:    Patient ID: Bianca [name], female    DOB: 1946-06-23, 72 y.o.   MRN: [id number]  HPI 72 year old female who  has a past medical history of Arthritis, Blood transfusion without reported diagnosis, CAD (coronary artery disease), Carpal tunnel syndrome on both sides, Chronic lower back pain, Depression, GERD (gastroesophageal reflux disease), Hyperlipidemia, Myocardial infarction ([place]) (1997, 2007, 2013), and Numbness of foot.  Presents to the office today for acute complaint of panic attacks.  Ports the panic attacks started approximately 2 weeks ago and have been happening daily x5 times a day.  During panic attack she always feels like she is anxious.  She reports crying uncontrollably.  She was seen at cardiac rehab yesterday which prompted this appointment.  Cardiac rehab she was tearful and afraid complaining of throat tightness and reflux symptoms with uncontrollable crying.  She is seen by a psychiatrist who has prescribed her Wellbutrin for depression and for a brief moment time had her on Lexapro but DC'd this because the patient did not feel like it is working well.  She does not like going to see psychiatry because "all he does is prescribe medication."  She reports that her anxiety is multifactorial.  She is status post CABG x3 in October, and went through this without pain medication, he has a lot of worrying about her health, and worries about her daughter because her daughter self-employed and is not able to pay the bills.  She has an appointment on Monday with a therapist which she is looking forward to.   Review of Systems See HPI   Past Medical History:  Diagnosis Date  . Arthritis   . Blood transfusion without reported diagnosis   . CAD (coronary artery disease)    a. 1997 MI/PCI RCA;  b. 2007 MI/PCI of 100% RCA with Taxus DES x 3 placed, EF 55%;  c. 2010 Cath: stable anatomy;  d. 05/2012 NSTEMI/Cath/PCI: LM nl, LAD 60-43m, D1 20ost, LCX 53m, RCA 40-20m ISR,  60/95d (Treated w/ 2.75x33 Xience Xpedition DES), PDA 36m (Treated w/ PTCA), EF 60%.  . Carpal tunnel syndrome on both sides   . Chronic lower back pain   . Depression   . GERD (gastroesophageal reflux disease)   . Hyperlipidemia   . Myocardial infarction [facility]) 1997, 2007, 2013  . Numbness of foot    Left foot - back surgery 2009    Social History   Socioeconomic History  . Marital status: Married    Spouse name: Not on file  . Number of children: Not on file  . Years of education: Not on file  . Highest education level: Not on file  Occupational History  . Not on file  Social Needs  . Financial resource strain: Not on file  . Food insecurity:    Worry: Not on file    Inability: Not on file  . Transportation needs:    Medical: Not on file    Non-medical: Not on file  Tobacco Use  . Smoking status: Former Smoker    Packs/day: 0.50    Years: 50.00    Pack years: 25.00    Types: Cigarettes    Last attempt to quit: 06/01/2017    Years since quitting: 0.5  . Smokeless tobacco: Never Used  Substance and Sexual Activity  . Alcohol use: No    Alcohol/week: 0.0 oz  . Drug use: No  . Sexual activity: Not Currently    Birth control/protection: Post-menopausal  Lifestyle  .  Physical activity:    Days per week: Not on file    Minutes per session: Not on file  . Stress: Not on file  Relationships  . Social connections:    Talks on phone: Not on file    Gets together: Not on file    Attends religious service: Not on file    Active member of club or organization: Not on file    Attends meetings of clubs or organizations: Not on file    Relationship status: Not on file  . Intimate partner violence:    Fear of current or ex partner: Not on file    Emotionally abused: Not on file    Physically abused: Not on file    Forced sexual activity: Not on file  Other Topics Concern  . Not on file  Social History Narrative   Is a homemaker   Married for 52 years    Has a  daughter and son    Pets: Two dogs and a Neurosurgeon   Likes to garden ( flower beds).        Past Surgical History:  Procedure Laterality Date  . ANKLE SURGERY  Years ago   Left; "had to take out a floater"  . CARPAL TUNNEL RELEASE Left 10/12016  . CESAREAN SECTION  1990  . CORONARY ANGIOPLASTY WITH STENT PLACEMENT  1997; 2007, 2013   "2 + 2; + 1= total of 5  . CORONARY ARTERY BYPASS GRAFT N/A 05/26/2017   Procedure: CORONARY ARTERY BYPASS GRAFTING (CABG), ON PUMP, TIMES Three, using left internal mammary artery and right greater saphenous vein harvested endoscopically;  Surgeon: [name], MD;  Location: [place];  Service: Open Heart Surgery;  Laterality: N/A;  LIMA to LAD, SVG to OM 1, SVG to PDA  . LEFT HEART CATH AND CORONARY ANGIOGRAPHY N/A 05/26/2017   Procedure: LEFT HEART CATH AND CORONARY ANGIOGRAPHY;  Surgeon: Belva Crome, MD;  Location: [place] [facility];  Service: Cardiovascular;  Laterality: N/A;  . LEFT HEART CATHETERIZATION WITH CORONARY ANGIOGRAM N/A 05/18/2012   Procedure: LEFT HEART CATHETERIZATION WITH CORONARY ANGIOGRAM;  Surgeon: Wellington Hampshire, MD;  Location: [place] [facility];  Service: Cardiovascular;  Laterality: N/A;  . [place]; 2009  . TEE WITHOUT CARDIOVERSION N/A 05/26/2017   Procedure: TRANSESOPHAGEAL ECHOCARDIOGRAM (TEE);  Surgeon: [name], MD;  Location: [place];  Service: Open Heart Surgery;  Laterality: N/A;  . TOTAL HIP ARTHROPLASTY Right 05/24/2014   Procedure: RIGHT TOTAL HIP ARTHROPLASTY ANTERIOR APPROACH;  Surgeon: Gearlean Alf, MD;  Location: [facility];  Service: Orthopedics;  Laterality: Right;    Family History  Problem Relation Age of Onset  . Cancer Paternal Grandfather        Esophageal  . Coronary artery disease Mother   . Diabetes Mother   . Hypertension Mother   . Dementia Father   . Sudden death Brother        Suicide    Allergies  Allergen Reactions  . Oxycodone Hcl Itching and Other (See  Comments)    Confusion, hallucinations  . Penicillins Itching and Rash    Has patient had a PCN reaction causing immediate rash, facial/tongue/throat swelling, SOB or lightheadedness with hypotension: yes rash that took a while to go away across the abdomen Has patient had a PCN reaction causing severe rash involving mucus membranes or skin necrosis: no Has patient had a PCN reaction that required hospitalization: no Has patient had a  PCN reaction occurring within the last 10 years: No If all of the above answers are "NO", then may proceed with Cephalosporin use.   . Tramadol Itching and Other (See Comments)    hallucinations  . Aspirin Nausea And Vomiting and Other (See Comments)    Burning in stomach- tolerates enteric coated  . Macrobid [Nitrofurantoin Monohyd Macro] Nausea And Vomiting  . Sulfa Antibiotics Nausea Only    Current Outpatient Medications on File Prior to Visit  Medication Sig Dispense Refill  . acetaminophen (TYLENOL) 325 MG tablet Take 2 tablets (650 mg total) by mouth every 6 (six) hours as needed for fever, headache, mild pain or moderate pain.    Marland Kitchen atorvastatin (LIPITOR) 80 MG tablet TAKE 1 TABLET BY MOUTH EVERY DAY AT 6PM 90 tablet 3  . baclofen (LIORESAL) 10 MG tablet Take 10 mg by mouth at bedtime.  0  . buPROPion (WELLBUTRIN XL) 300 MG 24 hr tablet Take 300 mg by mouth daily.  1  . clopidogrel (PLAVIX) 75 MG tablet Take 1 tablet (75 mg total) by mouth daily. 90 tablet 3  . cyanocobalamin (,VITAMIN B-12,) 1000 MCG/ML injection Inject 1,000 mcg into the muscle every 30 (thirty) days. Vitamin B12   - last injection 05/22/17    . dexlansoprazole (DEXILANT) 60 MG capsule Take 60 mg by mouth daily as needed (acid reflux).    . gabapentin (NEURONTIN) 300 MG capsule Take 300 mg by mouth at bedtime as needed (pain).    . hydrOXYzine (ATARAX/VISTARIL) 25 MG tablet Take 25 mg by mouth at bedtime as needed for anxiety or itching.    . meclizine (ANTIVERT) 25 MG tablet Take 25  mg by mouth 2 (two) times daily as needed for dizziness.    . Methen-Hyosc-Meth Blue-Na Phos (ME/NAPHOS/MB/HYO1) 81.6 MG TABS Take 1 tablet by mouth every 6 (six) hours as needed.  9  . Multiple Vitamin (MULTIVITAMIN WITH MINERALS) TABS tablet Take 1 tablet by mouth daily.    . Multiple Vitamins-Minerals (MACULAR HEALTH FORMULA) CAPS Take 1 capsule by mouth daily.    . pentosan polysulfate (ELMIRON) 100 MG capsule Take 200 mg by mouth 2 (two) times daily.    . polyvinyl alcohol (ARTIFICIAL TEARS) 1.4 % ophthalmic solution Place 1 drop into both eyes 2 (two) times daily as needed for dry eyes.    . Probiotic Product (PROBIOTIC PO) Take 1 capsule by mouth at bedtime. Nature [employer]'s brand    . Red Yeast Rice Extract (RED YEAST RICE PO) Take 1 capsule by mouth at bedtime.    . traZODone (DESYREL) 50 MG tablet Take 100 mg by mouth at bedtime.   0   No current facility-administered medications on file prior to visit.     BP 140/78   Temp 97.8 F (36.6 C) (Oral)   Wt 173 lb (78.5 kg)   LMP  (LMP Unknown)   BMI 31.64 kg/m       Objective:   Physical Exam  Constitutional: She is oriented to person, place, and time. She appears well-developed and well-nourished. No distress.  Tearful through exam  Cardiovascular: Normal rate, regular rhythm, normal heart sounds and intact distal pulses. Exam reveals no gallop and no friction rub.  No murmur heard. Pulmonary/Chest: Effort normal and breath sounds normal. No respiratory distress. She has no wheezes. She has no rales. She exhibits no tenderness.  Neurological: She is alert and oriented to person, place, and time.  Skin: Skin is warm and dry. No rash noted. She  is not diaphoretic. No erythema. No pallor.  Psychiatric: She has a normal mood and affect. Her behavior is normal. Judgment and thought content normal.  Nursing note and vitals reviewed.     Assessment & Plan:  We talked at length about her panic attacks and anxiety.  I am glad that  she has a therapy session within the next couple of days.  I am willing to give her a one-time prescription for Xanax 0.25 mg twice daily as needed to help her get through the severe panic attacks until she starts talk therapy.  I am hopeful that talk therapy will provide her relief from anxiety and panic attacks.  In okay with her finding another psychiatrist, somebody that she feels more comfortable with to help manage medications. -Follow-up if no improvement in the next week  [name], NP

## 2017-12-03 NOTE — Progress Notes (Signed)
Cardiac Individual Treatment Plan  Patient Details  Name: Bianca [name] MRN: [id number] Date of Birth: 08/26/1945 Referring Provider:   Flowsheet Row CARDIAC REHAB PHASE II ORIENTATION from 10/13/2017 in [facility]  Referring Provider  Dorris Carnes MD      Initial Encounter Date:  [place] PHASE II ORIENTATION from 10/13/2017 in [facility]  Date  10/13/17  Referring Provider  Dorris Carnes MD      Visit Diagnosis: S/P CABG x 3  Patient's Home Medications on Admission:  Current Outpatient Medications:  .  acetaminophen (TYLENOL) 325 MG tablet, Take 2 tablets (650 mg total) by mouth every 6 (six) hours as needed for fever, headache, mild pain or moderate pain., Disp: , Rfl:  .  ALPRAZolam (XANAX) 0.25 MG tablet, Take 1 tablet (0.25 mg total) by mouth 2 (two) times daily as needed for up to 30 doses for anxiety., Disp: 30 tablet, Rfl: 0 .  atorvastatin (LIPITOR) 80 MG tablet, TAKE 1 TABLET BY MOUTH EVERY DAY AT 6PM, Disp: 90 tablet, Rfl: 3 .  baclofen (LIORESAL) 10 MG tablet, Take 10 mg by mouth at bedtime., Disp: , Rfl: 0 .  buPROPion (WELLBUTRIN XL) 300 MG 24 hr tablet, Take 300 mg by mouth daily., Disp: , Rfl: 1 .  clopidogrel (PLAVIX) 75 MG tablet, Take 1 tablet (75 mg total) by mouth daily., Disp: 90 tablet, Rfl: 3 .  cyanocobalamin (,VITAMIN B-12,) 1000 MCG/ML injection, Inject 1,000 mcg into the muscle every 30 (thirty) days. Vitamin B12   - last injection 05/22/17, Disp: , Rfl:  .  dexlansoprazole (DEXILANT) 60 MG capsule, Take 60 mg by mouth daily as needed (acid reflux)., Disp: , Rfl:  .  gabapentin (NEURONTIN) 300 MG capsule, Take 300 mg by mouth at bedtime as needed (pain)., Disp: , Rfl:  .  hydrOXYzine (ATARAX/VISTARIL) 25 MG tablet, Take 25 mg by mouth at bedtime as needed for anxiety or itching., Disp: , Rfl:  .  meclizine (ANTIVERT) 25 MG tablet, Take 25 mg by mouth 2 (two) times daily as needed  for dizziness., Disp: , Rfl:  .  Methen-Hyosc-Meth Blue-Na Phos (ME/NAPHOS/MB/HYO1) 81.6 MG TABS, Take 1 tablet by mouth every 6 (six) hours as needed., Disp: , Rfl: 9 .  Multiple Vitamin (MULTIVITAMIN WITH MINERALS) TABS tablet, Take 1 tablet by mouth daily., Disp: , Rfl:  .  Multiple Vitamins-Minerals (MACULAR HEALTH FORMULA) CAPS, Take 1 capsule by mouth daily., Disp: , Rfl:  .  pentosan polysulfate (ELMIRON) 100 MG capsule, Take 200 mg by mouth 2 (two) times daily., Disp: , Rfl:  .  polyvinyl alcohol (ARTIFICIAL TEARS) 1.4 % ophthalmic solution, Place 1 drop into both eyes 2 (two) times daily as needed for dry eyes., Disp: , Rfl:  .  Probiotic Product (PROBIOTIC PO), Take 1 capsule by mouth at bedtime. [employer] brand, Disp: , Rfl:  .  Red Yeast Rice Extract (RED YEAST RICE PO), Take 1 capsule by mouth at bedtime., Disp: , Rfl:  .  traZODone (DESYREL) 50 MG tablet, Take 100 mg by mouth at bedtime. , Disp: , Rfl: 0  Past Medical History: Past Medical History:  Diagnosis Date  . Arthritis   . Blood transfusion without reported diagnosis   . CAD (coronary artery disease)    a. 1997 MI/PCI RCA;  b. 2007 MI/PCI of 100% RCA with Taxus DES x 3 placed, EF 55%;  c. 2010 Cath: stable anatomy;  d. 05/2012  NSTEMI/Cath/PCI: LM nl, LAD 60-32m, D1 20ost, LCX 70m, RCA 40-28m ISR, 60/95d (Treated w/ 2.75x33 Xience Xpedition DES), PDA 56m (Treated w/ PTCA), EF 60%.  . Carpal tunnel syndrome on both sides   . Chronic lower back pain   . Depression   . GERD (gastroesophageal reflux disease)   . Hyperlipidemia   . Myocardial infarction [facility]) 1997, 2007, 2013  . Numbness of foot    Left foot - back surgery 2009    Tobacco Use: Social History   Tobacco Use  Smoking Status Former Smoker  . Packs/day: 0.50  . Years: 50.00  . Pack years: 25.00  . Types: Cigarettes  . Last attempt to quit: 06/01/2017  . Years since quitting: 0.5  Smokeless Tobacco Never Used    Labs: Recent Review Flowsheet  Data    Labs for ITP Cardiac and Pulmonary Rehab Latest Ref Rng & Units 05/26/2017 05/27/2017 05/27/2017 05/27/2017 05/27/2017   Cholestrol 0 - 200 mg/dL - - - - -   LDLCALC 0 - 99 mg/dL - - - - -   LDLDIRECT mg/dL - - - - -   HDL >39.00 mg/dL - - - - -   Trlycerides 0.0 - 149.0 mg/dL - - - - -   Hemoglobin A1c 4.6 - 6.5 % - - - - -   PHART 7.350 - 7.450 7.418 7.215(L) 7.307(L) 7.253(L) -   PCO2ART 32.0 - 48.0 mmHg 37.6 47.9 46.2 50.8(H) -   HCO3 20.0 - 28.0 mmol/L 24.2 19.4(L) 22.9 22.3 -   TCO2 22 - 32 mmol/L 25 21(L) 24 24 24    ACIDBASEDEF 0.0 - 2.0 mmol/L - 8.0(H) 3.0(H) 5.0(H) -   O2SAT % 100.0 96.0 99.0 97.0 -      Capillary Blood Glucose: Lab Results  Component Value Date   GLUCAP 107 (H) 06/03/2017   GLUCAP 105 (H) 06/02/2017   GLUCAP 110 (H) 06/01/2017   GLUCAP 122 (H) 06/01/2017   GLUCAP 112 (H) 06/01/2017     Exercise Target Goals:    Exercise Program Goal: Individual exercise prescription set using results from initial 6 min walk test and THRR while considering  patient's activity barriers and safety.   Exercise Prescription Goal: Initial exercise prescription builds to 30-45 minutes a day of aerobic activity, 2-3 days per week.  Home exercise guidelines will be given to patient during program as part of exercise prescription that the participant will acknowledge.  Activity Barriers & Risk Stratification: Activity Barriers & Cardiac Risk Stratification - 10/13/17 0903    Activity Barriers & Cardiac Risk Stratification          Activity Barriers  Joint Problems;Deconditioning;Muscular Weakness;Back Problems;Arthritis;Right Hip Replacement;Shortness of Breath;Balance Concerns    Cardiac Risk Stratification  High           6 Minute Walk: 6 Minute Walk    6 Minute Walk    Row Name 10/13/17 1152 10/13/17 1218 10/13/17 1237   Phase  Initial  no documentation  Initial   Distance  907 feet  no documentation  907 feet   Walk Time  6 minutes  4.47 minutes  4.47  minutes   # of Rest Breaks  0  2  2   MPH  1.7  2.3  2.3   METS  1.6  1.7  1.7   RPE  11  11  11    Perceived Dyspnea   1  1  no documentation   VO2 Peak  5.7  5.9  2.9  Symptoms  Yes (comment)  Yes (comment)  Yes (comment)   Comments  pt took 2 short rest breaks, 1st break at 43 seconds and 2nd break at 30 seconds  pt took 2 short rest breaks, 1st break at 43 seconds and 2nd break at 30 seconds; mild SOB; dry mouth  pt took 2 short rest breaks, 1st break at 43 seconds and 2nd break at 30 seconds; mild SOB; dry mouth   Resting HR  64 bpm  no documentation  64 bpm   Resting BP  102/64  no documentation  102/64   Resting Oxygen Saturation   91 %  no documentation  91 %   Exercise Oxygen Saturation  during 6 min walk  93 %  no documentation  93 %   Max Ex. HR  83 bpm  no documentation  83 bpm   Max Ex. BP  148/60  no documentation  148/60   2 Minute Post BP  118/70  no documentation  118/70          Oxygen Initial Assessment:   Oxygen Re-Evaluation:   Oxygen Discharge (Final Oxygen Re-Evaluation):   Initial Exercise Prescription: Initial Exercise Prescription - 10/13/17 1200    Date of Initial Exercise RX and Referring Provider          Date  10/13/17    Referring Provider  Dorris Carnes MD        Recumbant Bike          Level  1    Minutes  15    METs  1        NuStep          Level  2    Minutes  15    METs  1.4        Track          Laps  8    Minutes  15    METs  2        Prescription Details          Frequency (times per week)  3    Duration  Progress to 30 minutes of continuous aerobic without signs/symptoms of physical distress        Intensity          THRR 40-80% of Max Heartrate  60-119    Ratings of Perceived Exertion  11-13    Perceived Dyspnea  0-4        Progression          Progression  Continue to progress workloads to maintain intensity without signs/symptoms of physical distress.        Resistance Training          Training  Prescription  Yes    Weight  2lbs    Reps  10-15           Perform Capillary Blood Glucose checks as needed.  Exercise Prescription Changes: Exercise Prescription Changes    Response to Exercise    Row Name 10/21/17 1608 11/02/17 1424 11/17/17 1226 12/03/17 1100   Blood Pressure (Admit)  120/60  112/72  114/73  112/62   Blood Pressure (Exercise)  130/70  132/70  104/60  110/62   Blood Pressure (Exit)  114/70  112/62  108/62  120/62   Heart Rate (Admit)  75 bpm  73 bpm  68 bpm  70 bpm   Heart Rate (Exercise)  92 bpm  85 bpm  77 bpm  90 bpm  Heart Rate (Exit)  74 bpm  73 bpm  67 bpm  68 bpm   Rating of Perceived Exertion (Exercise)  14  13  13  13    Symptoms  back pain  back pain  no documentation  no documentation   Duration  Progress to 30 minutes of  aerobic without signs/symptoms of physical distress  Progress to 30 minutes of  aerobic without signs/symptoms of physical distress  Progress to 30 minutes of  aerobic without signs/symptoms of physical distress  Progress to 30 minutes of  aerobic without signs/symptoms of physical distress   Intensity  THRR unchanged  THRR unchanged  THRR unchanged  THRR unchanged       Progression    Row Name 10/21/17 1608 11/02/17 1424 11/17/17 1226 12/03/17 1100   Progression  Continue to progress workloads to maintain intensity without signs/symptoms of physical distress.  Continue to progress workloads to maintain intensity without signs/symptoms of physical distress.  Continue to progress workloads to maintain intensity without signs/symptoms of physical distress.  Continue to progress workloads to maintain intensity without signs/symptoms of physical distress.   Average METs  no documentation  2.5  2.4  2.4       Resistance Training    Row Name 10/21/17 1608 11/02/17 1424 11/17/17 1226 12/03/17 1100   Training Prescription  No relaxation day  Yes  Yes  Yes   Weight  no documentation  2lbs  2lbs  2lbs   Reps  no documentation  10-15  10-15   10-15   Time  no documentation  10 Minutes  [place] Name 10/21/17 1608 11/02/17 1424 11/17/17 1226 12/03/17 1100   Level  1  2  no documentation  2   Minutes  15  15  no documentation  15   METs  no documentation  2.6  no documentation  2       NuStep    Row Name 10/21/17 1608 11/02/17 1424 11/17/17 1226 12/03/17 1100   Level  no documentation  no documentation  2  2   Minutes  no documentation  no documentation  15  15   METs  no documentation  no documentation  2.2  2.2       Track    Row Name 10/21/17 1608 11/02/17 1424 11/17/17 1226 12/03/17 1100   Laps  14  13  13   no documentation   Minutes  15  15  15   no documentation   METs  2.62  2.51  2.51  no documentation       [place] Name 10/21/17 1608 11/02/17 1424 11/17/17 1226 12/03/17 1100   Plans to continue exercise at  no documentation  Home (comment) walking 3(10'bouts), chair exercises   Home (comment) walking 3(10'bouts), chair exercises   Home (comment) walking 3(10'bouts), chair exercises    Frequency  no documentation  Add 3 additional days to program exercise sessions.  Add 3 additional days to program exercise sessions.  Add 3 additional days to program exercise sessions.   Initial Home Exercises Provided  no documentation  11/03/17  11/03/17  11/03/17          Exercise Comments: Exercise Comments    Row Name 10/22/17 1609 11/04/17 1114 12/03/17 1118   Exercise Comments  Pt responded well to first exercise session in cardiac rehab. Pt did c/o of back pain  but was able to continue with exercise. Will continue to monitor and progress WL's as tolerated.  Reviewed METs and goals. Pt is tolerating exercise well. Reviewed HEP on 11/02/17.   Reviewed METs and goals. Pt is tolerating exercise well. Reviewed HEP on 11/02/17.       Exercise Goals and Review: Exercise Goals    Exercise Goals    Row Name 10/13/17 0903   Increase Physical Activity  Yes    Intervention  Provide advice, education, support and counseling about physical activity/exercise needs.;Develop an individualized exercise prescription for aerobic and resistive training based on initial evaluation findings, risk stratification, comorbidities and participant's personal goals.   Expected Outcomes  Short Term: Attend rehab on a regular basis to increase amount of physical activity.;Long Term: Exercising regularly at least 3-5 days a week.;Long Term: Add in home exercise to make exercise part of routine and to increase amount of physical activity.   Increase Strength and Stamina  Yes   Intervention  Provide advice, education, support and counseling about physical activity/exercise needs.;Develop an individualized exercise prescription for aerobic and resistive training based on initial evaluation findings, risk stratification, comorbidities and participant's personal goals.   Expected Outcomes  Short Term: Increase workloads from initial exercise prescription for resistance, speed, and METs.;Short Term: Perform resistance training exercises routinely during rehab and add in resistance training at home;Long Term: Improve cardiorespiratory fitness, muscular endurance and strength as measured by increased METs and functional capacity (6MWT)   Able to understand and use rate of perceived exertion (RPE) scale  Yes   Intervention  Provide education and explanation on how to use RPE scale   Expected Outcomes  Short Term: Able to use RPE daily in rehab to express subjective intensity level;Long Term:  Able to use RPE to guide intensity level when exercising independently   Able to understand and use Dyspnea scale  Yes   Intervention  Provide education and explanation on how to use Dyspnea scale   Expected Outcomes  Short Term: Able to use Dyspnea scale daily in rehab to express subjective sense of shortness of breath during exertion;Long Term: Able to use Dyspnea scale to guide intensity level when  exercising independently   Knowledge and understanding of Target Heart Rate Range (THRR)  Yes   Intervention  Provide education and explanation of THRR including how the numbers were predicted and where they are located for reference   Expected Outcomes  Short Term: Able to state/look up THRR;Long Term: Able to use THRR to govern intensity when exercising independently;Short Term: Able to use daily as guideline for intensity in rehab   Able to check pulse independently  Yes   Intervention  Provide education and demonstration on how to check pulse in carotid and radial arteries.;Review the importance of being able to check your own pulse for safety during independent exercise   Expected Outcomes  Short Term: Able to explain why pulse checking is important during independent exercise;Long Term: Able to check pulse independently and accurately   Understanding of Exercise Prescription  Yes   Intervention  Provide education, explanation, and written materials on patient's individual exercise prescription   Expected Outcomes  Short Term: Able to explain program exercise prescription;Long Term: Able to explain home exercise prescription to exercise independently          Exercise Goals Re-Evaluation : Exercise Goals Re-Evaluation    Exercise Goal Re-Evaluation    Row Name 11/04/17 1115 12/03/17 1117   Exercise Goals Review  Increase Physical Activity;Able  to understand and use rate of perceived exertion (RPE) scale;Knowledge and understanding of Target Heart Rate Range (THRR);Understanding of Exercise Prescription;Increase Strength and Stamina;Able to check pulse independently  Increase Physical Activity;Able to understand and use rate of perceived exertion (RPE) scale;Knowledge and understanding of Target Heart Rate Range (THRR);Understanding of Exercise Prescription;Increase Strength and Stamina;Able to check pulse independently   Comments  Pt is tolerating light exercise in cardiac rehab. Pt is able  to walk around walking track without an assistive device. Pt reported "lower extremity getting stronger and taking it day by day,"  pt exertional ability limited by back pain.  pt is walking 20 minutes with her daughter.     Expected Outcomes  Pt will continue to improve in cardiorespiratory fitness by coming to cardiac rehab and being compliant with HEP  Pt will continue to improve in cardiorespiratory fitness by coming to cardiac rehab and being compliant with HEP           Discharge Exercise Prescription (Final Exercise Prescription Changes): Exercise Prescription Changes - 12/03/17 1100    Response to Exercise          Blood Pressure (Admit)  112/62    Blood Pressure (Exercise)  110/62    Blood Pressure (Exit)  120/62    Heart Rate (Admit)  70 bpm    Heart Rate (Exercise)  90 bpm    Heart Rate (Exit)  68 bpm    Rating of Perceived Exertion (Exercise)  13    Duration  Progress to 30 minutes of  aerobic without signs/symptoms of physical distress    Intensity  THRR unchanged        Progression          Progression  Continue to progress workloads to maintain intensity without signs/symptoms of physical distress.    Average METs  2.4        Resistance Training          Training Prescription  Yes    Weight  2lbs    Reps  10-15    Time  10 Minutes        Recumbant Bike          Level  2    Minutes  15    METs  2        NuStep          Level  2    Minutes  15    METs  2.2        Home Exercise Plan          Plans to continue exercise at  Home (comment) walking 3(10'bouts), chair exercises     Frequency  Add 3 additional days to program exercise sessions.    Initial Home Exercises Provided  11/03/17           Nutrition:  Target Goals: Understanding of nutrition guidelines, daily intake of sodium 1500mg , cholesterol 200mg , calories 30% from fat and 7% or less from saturated fats, daily to have 5 or more servings of fruits and vegetables.  Biometrics: Pre  Biometrics - 10/13/17 1221    Pre Biometrics          Height  5\' 2"  (1.575 m)    Weight  185 lb 3 oz (84 kg)    Waist Circumference  39.75 inches    Hip Circumference  48 inches    Waist to Hip Ratio  0.83 %    BMI (Calculated)  33.86    Triceps Skinfold  23 mm    % Body Fat  43.6 %    Grip Strength  31 kg    Flexibility  0 in LBP!    Single Leg Stand  0 seconds            Nutrition Therapy Plan and Nutrition Goals: Nutrition Therapy & Goals - 10/13/17 1207    Nutrition Therapy          Diet  Heart Healthy        Personal Nutrition Goals          Nutrition Goal  Wt loss of 1-2 lb/week to a wt loss goal of 6-24 lb at graduation from [place].         Intervention Plan          Intervention  Prescribe, educate and counsel regarding individualized specific dietary modifications aiming towards targeted core components such as weight, hypertension, lipid management, diabetes, heart failure and other comorbidities.    Expected Outcomes  Short Term Goal: Understand basic principles of dietary content, such as calories, fat, sodium, cholesterol and nutrients.;Long Term Goal: Adherence to prescribed nutrition plan.           Nutrition Assessments: Nutrition Assessments - 10/13/17 1207    MEDFICTS Scores          Pre Score  30           Nutrition Goals Re-Evaluation:   Nutrition Goals Re-Evaluation:   Nutrition Goals Discharge (Final Nutrition Goals Re-Evaluation):   Psychosocial: Target Goals: Acknowledge presence or absence of significant depression and/or stress, maximize coping skills, provide positive support system. Participant is able to verbalize types and ability to use techniques and skills needed for reducing stress and depression.  Initial Review & Psychosocial Screening: Initial Psych Review & Screening - 10/13/17 1152    Initial Review          Current issues with  None Identified        Family Dynamics          Good Support System?   Yes husband         Barriers          Psychosocial barriers to participate in program  The patient should benefit from training in stress management and relaxation.        Screening Interventions          Interventions  Encouraged to exercise    Expected Outcomes  Short Term goal: Identification and review with participant of any Quality of Life or Depression concerns found by scoring the questionnaire.;Long Term goal: The participant improves quality of Life and PHQ9 Scores as seen by post scores and/or verbalization of changes           Quality of Life Scores: Quality of Life - 10/30/17 1446    Quality of Life Scores          Health/Function Pre  18.17 % pt mostly concerned about dyspnea.      Socioeconomic Pre  28.94 %    Psych/Spiritual Pre  27.93 %    Family Pre  14.4 % pt concerned about family dynamic and role changes with her cardiac illness.  will plan home exercise instruction next week with family present to help clarify pt expectations, capabilities and limitations at this time.      GLOBAL Pre  22.04 % pt offered emotional support and reassurance. pt offered counseling with Jeanella Craze.  pt will let us know if she is interested  in making appointment.            Scores of 19 and below usually indicate a poorer quality of life in these areas.  A difference of  2-3 points is a clinically meaningful difference.  A difference of 2-3 points in the total score of the Quality of Life Index has been associated with significant improvement in overall quality of life, self-image, physical symptoms, and general health in studies assessing change in quality of life.  PHQ-9: Recent Review Flowsheet Data    Depression screen [facility] 2/9 10/21/2017 01/24/2015 01/10/2014   Decreased Interest 1 1 0   Down, Depressed, Hopeless 3 1 0   PHQ - 2 Score 4 2 0   Altered sleeping 1 3 -   Tired, decreased energy 1 3 -   Change in appetite 0 0 -   Feeling bad or failure about yourself  0 0 -    Trouble concentrating 1 0 -   Moving slowly or fidgety/restless 0 0 -   Suicidal thoughts 0 0 -   PHQ-9 Score 7 8 -   Difficult doing work/chores Somewhat difficult - -     Interpretation of Total Score  Total Score Depression Severity:  1-4 = Minimal depression, 5-9 = Mild depression, 10-14 = Moderate depression, 15-19 = Moderately severe depression, 20-27 = Severe depression   Psychosocial Evaluation and Intervention: Psychosocial Evaluation - 10/21/17 1503    Psychosocial Evaluation & Interventions          Interventions  Physician referral;Stress management education;Relaxation education;Encouraged to exercise with the program and follow exercise prescription    Comments  pt with known history of depression currently being treated by psychiatry.  pt feels current treatment is not effective. pt has previously scheduled appointment for montly followup. pt feels pressure from her family to improve her health and make lifestyle modifications to decrease RF.  pt enjoys shopping however is currenlty limited by back pain.      Expected Outcomes  pt will exhibit positive outlook with improved coping skills.     Continue Psychosocial Services   Follow up required by staff           Psychosocial Re-Evaluation: Psychosocial Re-Evaluation    Psychosocial Re-Evaluation    [place] Name 10/27/17 1359 12/03/17 0744   Current issues with  Current Stress Concerns;Current Depression  Current Stress Concerns;Current Depression   Comments  pt with known history of depression currently being treated by psychiatry.  pt feels current treatment is not effective. pt has previously scheduled appointment for montly followup. pt feels pressure from her family to improve her health and make lifestyle modifications to decrease RF.  pt enjoys shopping however is currenlty limited by back pain.    pt with known history of depression currently being treated by psychiatry.  pt feels current treatment is not effective.  pt  and dtr are interested in seeking different provider, however declined offer to make appt.  prefer to wait for previously scheduled appt next week. pt scheduled to see PCP 12/03/17 and Jeanella Craze 12/10/2017.   pt feels pressure from her family to improve her health and make lifestyle modifications to decrease RF. pt daughter is very proactively concerned about pt following strict regimen which leads to frustration for them both.       Expected Outcomes  pt will exhibit positive outlook with good coping skills.   pt will exhibit positive outlook with good coping skills and improved family dynamics.  Interventions  Stress management education;Encouraged to attend Cardiac Rehabilitation for the exercise;Relaxation education  Stress management education;Encouraged to attend Cardiac Rehabilitation for the exercise;Relaxation education;Therapist referral;Physician referral   Continue Psychosocial Services   Follow up required by staff  Follow up required by staff       Initial Review    Row Name 10/27/17 1359 12/03/17 0744   Source of Stress Concerns  Chronic Illness;Poor Coping Skills  Chronic Illness;Poor Coping Skills;Family          Psychosocial Discharge (Final Psychosocial Re-Evaluation): Psychosocial Re-Evaluation - 12/03/17 0744    Psychosocial Re-Evaluation          Current issues with  Current Stress Concerns;Current Depression    Comments  pt with known history of depression currently being treated by psychiatry.  pt feels current treatment is not effective.  pt and dtr are interested in seeking different provider, however declined offer to make appt.  prefer to wait for previously scheduled appt next week. pt scheduled to see PCP 12/03/17 and Jeanella Craze 12/10/2017.   pt feels pressure from her family to improve her health and make lifestyle modifications to decrease RF. pt daughter is very proactively concerned about pt following strict regimen which leads to frustration for them both.         Expected Outcomes  pt will exhibit positive outlook with good coping skills and improved family dynamics.     Interventions  Stress management education;Encouraged to attend Cardiac Rehabilitation for the exercise;Relaxation education;Therapist referral;Physician referral    Continue Psychosocial Services   Follow up required by staff        Initial Review          Source of Stress Concerns  Chronic Illness;Poor Coping Skills;Family           Vocational Rehabilitation: Provide vocational rehab assistance to qualifying candidates.   Vocational Rehab Evaluation & Intervention: Vocational Rehab - 10/13/17 1237    Initial Vocational Rehab Evaluation & Intervention          Assessment shows need for Vocational Rehabilitation  No           Education: Education Goals: Education classes will be provided on a weekly basis, covering required topics. Participant will state understanding/return demonstration of topics presented.  Learning Barriers/Preferences: Learning Barriers/Preferences - 10/13/17 0254    Learning Barriers/Preferences          Learning Barriers  Sight    Learning Preferences  Video;Pictoral;Verbal Instruction;Skilled Demonstration;Written Material           Education Topics: Count Your Pulse:  -Group instruction provided by verbal instruction, demonstration, patient participation and written materials to support subject.  Instructors address importance of being able to find your pulse and how to count your pulse when at home without a heart monitor.  Patients get hands on experience counting their pulse with staff help and individually.   Heart Attack, Angina, and Risk Factor Modification:  -Group instruction provided by verbal instruction, video, and written materials to support subject.  Instructors address signs and symptoms of angina and heart attacks.    Also discuss risk factors for heart disease and how to make changes to improve heart health risk  factors.   Functional Fitness:  -Group instruction provided by verbal instruction, demonstration, patient participation, and written materials to support subject.  Instructors address safety measures for doing things around the house.  Discuss how to get up and down off the floor, how to pick things up properly, how to  safely get out of a chair without assistance, and balance training.   Meditation and Mindfulness:  -Group instruction provided by verbal instruction, patient participation, and written materials to support subject.  Instructor addresses importance of mindfulness and meditation practice to help reduce stress and improve awareness.  Instructor also leads participants through a meditation exercise.    Stretching for Flexibility and Mobility:  -Group instruction provided by verbal instruction, patient participation, and written materials to support subject.  Instructors lead participants through series of stretches that are designed to increase flexibility thus improving mobility.  These stretches are additional exercise for major muscle groups that are typically performed during regular warm up and cool down.   Hands Only CPR:  -Group verbal, video, and participation provides a basic overview of AHA guidelines for community CPR. Role-play of emergencies allow participants the opportunity to practice calling for help and chest compression technique with discussion of AED use.   Hypertension: -Group verbal and written instruction that provides a basic overview of hypertension including the most recent diagnostic guidelines, risk factor reduction with self-care instructions and medication management.    Nutrition I class: Heart Healthy Eating:  -Group instruction provided by PowerPoint slides, verbal discussion, and written materials to support subject matter. The instructor gives an explanation and review of the Therapeutic Lifestyle Changes diet recommendations, which includes a  discussion on lipid goals, dietary fat, sodium, fiber, plant stanol/sterol esters, sugar, and the components of a well-balanced, healthy diet. Flowsheet Row CARDIAC REHAB PHASE II ORIENTATION from 10/13/2017 in [facility]  Date  10/13/17  Educator  RD      Nutrition II class: Lifestyle Skills:  -Group instruction provided by PowerPoint slides, verbal discussion, and written materials to support subject matter. The instructor gives an explanation and review of label reading, grocery shopping for heart health, heart healthy recipe modifications, and ways to make healthier choices when eating out. Flowsheet Row CARDIAC REHAB PHASE II ORIENTATION from 10/13/2017 in [facility]  Date  10/13/17  Educator  RD      Diabetes Question & Answer:  -Group instruction provided by PowerPoint slides, verbal discussion, and written materials to support subject matter. The instructor gives an explanation and review of diabetes co-morbidities, pre- and post-prandial blood glucose goals, pre-exercise blood glucose goals, signs, symptoms, and treatment of hypoglycemia and hyperglycemia, and foot care basics.   Diabetes Blitz:  -Group instruction provided by PowerPoint slides, verbal discussion, and written materials to support subject matter. The instructor gives an explanation and review of the physiology behind type 1 and type 2 diabetes, diabetes medications and rational behind using different medications, pre- and post-prandial blood glucose recommendations and Hemoglobin A1c goals, diabetes diet, and exercise including blood glucose guidelines for exercising safely.    Portion Distortion:  -Group instruction provided by PowerPoint slides, verbal discussion, written materials, and food models to support subject matter. The instructor gives an explanation of serving size versus portion size, changes in portions sizes over the last 20 years, and what  consists of a serving from each food group.   Stress Management:  -Group instruction provided by verbal instruction, video, and written materials to support subject matter.  Instructors review role of stress in heart disease and how to cope with stress positively.     Exercising on Your Own:  -Group instruction provided by verbal instruction, power point, and written materials to support subject.  Instructors discuss benefits of exercise, components of exercise, frequency and  intensity of exercise, and end points for exercise.  Also discuss use of nitroglycerin and activating EMS.  Review options of places to exercise outside of rehab.  Review guidelines for sex with heart disease.   Cardiac Drugs I:  -Group instruction provided by verbal instruction and written materials to support subject.  Instructor reviews cardiac drug classes: antiplatelets, anticoagulants, beta blockers, and statins.  Instructor discusses reasons, side effects, and lifestyle considerations for each drug class.   Cardiac Drugs II:  -Group instruction provided by verbal instruction and written materials to support subject.  Instructor reviews cardiac drug classes: angiotensin converting enzyme inhibitors (ACE-I), angiotensin II receptor blockers (ARBs), nitrates, and calcium channel blockers.  Instructor discusses reasons, side effects, and lifestyle considerations for each drug class.   Anatomy and Physiology of the Circulatory System:  Group verbal and written instruction and models provide basic cardiac anatomy and physiology, with the coronary electrical and arterial systems. Review of: AMI, Angina, Valve disease, Heart Failure, Peripheral Artery Disease, Cardiac Arrhythmia, Pacemakers, and the ICD.   Other Education:  -Group or individual verbal, written, or video instructions that support the educational goals of the cardiac rehab program.   Holiday Eating Survival Tips:  -Group instruction provided by  PowerPoint slides, verbal discussion, and written materials to support subject matter. The instructor gives patients tips, tricks, and techniques to help them not only survive but enjoy the holidays despite the onslaught of food that accompanies the holidays.   Knowledge Questionnaire Score: Knowledge Questionnaire Score - 10/13/17 1152    Knowledge Questionnaire Score          Pre Score  16/24           Core Components/Risk Factors/Patient Goals at Admission: Personal Goals and Risk Factors at Admission - 10/13/17 1215    Core Components/Risk Factors/Patient Goals on Admission           Weight Management  Yes;Obesity;Weight Maintenance;Weight Loss    Intervention  Weight Management: Develop a combined nutrition and exercise program designed to reach desired caloric intake, while maintaining appropriate intake of nutrient and fiber, sodium and fats, and appropriate energy expenditure required for the weight goal.;Weight Management: Provide education and appropriate resources to help participant work on and attain dietary goals.;Weight Management/Obesity: Establish reasonable short term and long term weight goals.;Obesity: Provide education and appropriate resources to help participant work on and attain dietary goals.    Admit Weight  185 lb 3 oz (84 kg)    Goal Weight: Short Term  180 lb (81.6 kg)    Goal Weight: Long Term  165 lb (74.8 kg)    Expected Outcomes  Short Term: Continue to assess and modify interventions until short term weight is achieved;Long Term: Adherence to nutrition and physical activity/exercise program aimed toward attainment of established weight goal;Weight Maintenance: Understanding of the daily nutrition guidelines, which includes 25-35% calories from fat, 7% or less cal from saturated fats, less than 200mg  cholesterol, less than 1.5gm of sodium, & 5 or more servings of fruits and vegetables daily;Weight Loss: Understanding of general recommendations for a balanced  deficit meal plan, which promotes 1-2 lb weight loss per week and includes a negative energy balance of [phone] kcal/d;Understanding recommendations for meals to include 15-35% energy as protein, 25-35% energy from fat, 35-60% energy from carbohydrates, less than 200mg  of dietary cholesterol, 20-35 gm of total fiber daily;Understanding of distribution of calorie intake throughout the day with the consumption of 4-5 meals/snacks    Improve shortness of breath with  ADL's  Yes    Intervention  Provide education, individualized exercise plan and daily activity instruction to help decrease symptoms of SOB with activities of daily living.    Expected Outcomes  Short Term: Improve cardiorespiratory fitness to achieve a reduction of symptoms when performing ADLs;Long Term: Be able to perform more ADLs without symptoms or delay the onset of symptoms    Hypertension  Yes    Intervention  Provide education on lifestyle modifcations including regular physical activity/exercise, weight management, moderate sodium restriction and increased consumption of fresh fruit, vegetables, and low fat dairy, alcohol moderation, and smoking cessation.;Monitor prescription use compliance.    Expected Outcomes  Short Term: Continued assessment and intervention until BP is < 140/11mm HG in hypertensive participants. < 130/73mm HG in hypertensive participants with diabetes, heart failure or chronic kidney disease.;Long Term: Maintenance of blood pressure at goal levels.    Lipids  Yes    Intervention  Provide education and support for participant on nutrition & aerobic/resistive exercise along with prescribed medications to achieve LDL 70mg , HDL >40mg .    Expected Outcomes  Short Term: Participant states understanding of desired cholesterol values and is compliant with medications prescribed. Participant is following exercise prescription and nutrition guidelines.;Long Term: Cholesterol controlled with medications as prescribed, with  individualized exercise RX and with personalized nutrition plan. Value goals: LDL < 70mg , HDL > 40 mg.    Stress  Yes    Intervention  Offer individual and/or small group education and counseling on adjustment to heart disease, stress management and health-related lifestyle change. Teach and support self-help strategies.;Refer participants experiencing significant psychosocial distress to appropriate mental health specialists for further evaluation and treatment. When possible, include family members and significant others in education/counseling sessions.    Expected Outcomes  Short Term: Participant demonstrates changes in health-related behavior, relaxation and other stress management skills, ability to obtain effective social support, and compliance with psychotropic medications if prescribed.;Long Term: Emotional wellbeing is indicated by absence of clinically significant psychosocial distress or social isolation.           Core Components/Risk Factors/Patient Goals Review:  Goals and Risk Factor Review    Core Components/Risk Factors/Patient Goals Review    Row Name 10/21/17 1501 10/27/17 1359 11/16/17 1537 12/03/17 0742   Personal Goals Review  Weight Management/Obesity;Improve shortness of breath with ADL's;Hypertension;Lipids;Stress  Weight Management/Obesity;Improve shortness of breath with ADL's;Hypertension;Lipids;Stress  Weight Management/Obesity;Improve shortness of breath with ADL's;Hypertension;Lipids;Stress  Weight Management/Obesity;Improve shortness of breath with ADL's;Hypertension;Lipids;Stress   Review  pt with multiple CAD RF demonstrates willingness to participate in CR program. pt personal goals are to decrease dyspnea and increase physical activity/strength/stamina.   pt with multiple CAD RF demonstrates willingness to participate in CR program. pt personal goals are to decrease dyspnea and increase physical activity/strength/stamina. pt reports her daughter has been advocate  for her participating in home exercise.    pt with multiple CAD RF demonstrates willingness to participate in CR program. pt personal goals are to decrease dyspnea and increase physical activity/strength/stamina. pt reports she has recently been diagnosed with pre-diabetes.  pt educated on importance of eliminating sugars and white starches from diet.    pt with multiple CAD RF demonstrates willingness to participate in CR program. pt personal goals are to decrease dyspnea and increase physical activity/strength/stamina. pt reports she has recently been diagnosed with pre-diabetes.  pt educated on importance of eliminating sugars and white starches from diet. pt admits to occasional tobacco use. pt cautioned against this however also congratulated  on her efforts thus far, with pt having made significant changes in the past year.      Expected Outcomes  pt will participate in CR exercise, nutrition and lifestyle modification activities to decrease overall RF.   pt will participate in CR exercise, nutrition and lifestyle modification activities to decrease overall RF.   pt will participate in CR exercise, nutrition and lifestyle modification activities to decrease overall RF.   pt will participate in CR exercise, nutrition and lifestyle modification activities to decrease overall RF.           Core Components/Risk Factors/Patient Goals at Discharge (Final Review):  Goals and Risk Factor Review - 12/03/17 0742    Core Components/Risk Factors/Patient Goals Review          Personal Goals Review  Weight Management/Obesity;Improve shortness of breath with ADL's;Hypertension;Lipids;Stress    Review  pt with multiple CAD RF demonstrates willingness to participate in CR program. pt personal goals are to decrease dyspnea and increase physical activity/strength/stamina. pt reports she has recently been diagnosed with pre-diabetes.  pt educated on importance of eliminating sugars and white starches from diet. pt  admits to occasional tobacco use. pt cautioned against this however also congratulated on her efforts thus far, with pt having made significant changes in the past year.       Expected Outcomes  pt will participate in CR exercise, nutrition and lifestyle modification activities to decrease overall RF.            ITP Comments: ITP Comments    Row Name 10/13/17 0901 10/21/17 1456 10/27/17 1353 12/03/17 0742   ITP Comments  Medical Director, Dr. Fransico Him  pt started group exercise sessions. pt tolerated light activity utilizing rollator for walking.    30 day ITP review.  pt with good attendance and participation.    30 day ITP review.  pt with good attendance and participation.  pt with health related anxiety exhibits eagerness to participate in CR program.      Comments:  Andi Hence, RN, BSN Cardiac Pulmonary Rehab 12/03/17 3:20 PM

## 2017-12-04 ENCOUNTER — Telehealth (HOSPITAL_COMMUNITY): Payer: Self-pay | Admitting: Adult Health

## 2017-12-04 ENCOUNTER — Encounter (HOSPITAL_COMMUNITY): Payer: BLUE CROSS/BLUE SHIELD

## 2017-12-07 ENCOUNTER — Encounter (HOSPITAL_COMMUNITY): Payer: BLUE CROSS/BLUE SHIELD

## 2017-12-07 DIAGNOSIS — Z981 Arthrodesis status: Secondary | ICD-10-CM | POA: Diagnosis not present

## 2017-12-07 DIAGNOSIS — M47816 Spondylosis without myelopathy or radiculopathy, lumbar region: Secondary | ICD-10-CM | POA: Diagnosis not present

## 2017-12-07 DIAGNOSIS — M48062 Spinal stenosis, lumbar region with neurogenic claudication: Secondary | ICD-10-CM | POA: Diagnosis not present

## 2017-12-07 DIAGNOSIS — M5136 Other intervertebral disc degeneration, lumbar region: Secondary | ICD-10-CM | POA: Diagnosis not present

## 2017-12-08 DIAGNOSIS — R300 Dysuria: Secondary | ICD-10-CM | POA: Diagnosis not present

## 2017-12-08 DIAGNOSIS — N390 Urinary tract infection, site not specified: Secondary | ICD-10-CM | POA: Diagnosis not present

## 2017-12-09 ENCOUNTER — Encounter (HOSPITAL_COMMUNITY): Payer: BLUE CROSS/BLUE SHIELD

## 2017-12-09 ENCOUNTER — Telehealth (HOSPITAL_COMMUNITY): Payer: Self-pay

## 2017-12-09 NOTE — Telephone Encounter (Signed)
Patient called and stated she will not be attending class today or Friday as she is on a medicine that requires no exercise.

## 2017-12-11 ENCOUNTER — Encounter (HOSPITAL_COMMUNITY): Payer: BLUE CROSS/BLUE SHIELD

## 2017-12-11 ENCOUNTER — Telehealth (HOSPITAL_COMMUNITY): Payer: Self-pay | Admitting: Cardiac Rehabilitation

## 2017-12-11 NOTE — Telephone Encounter (Signed)
pc to assess reason for continued absence from CR.  LMOM.  Andi Hence, RN, BSN Cardiac Pulmonary Rehab 12/11/17 1:58 PM

## 2017-12-14 ENCOUNTER — Encounter (HOSPITAL_COMMUNITY): Payer: BLUE CROSS/BLUE SHIELD

## 2017-12-16 ENCOUNTER — Encounter (HOSPITAL_COMMUNITY)
Admission: RE | Admit: 2017-12-16 | Discharge: 2017-12-16 | Disposition: A | Discharge status: Home / Self Care | Payer: BLUE CROSS/BLUE SHIELD | Source: Ambulatory Visit | Attending: Internal Medicine | Admitting: Internal Medicine

## 2017-12-16 DIAGNOSIS — E785 Hyperlipidemia, unspecified: Secondary | ICD-10-CM | POA: Diagnosis not present

## 2017-12-16 DIAGNOSIS — Z7902 Long term (current) use of antithrombotics/antiplatelets: Secondary | ICD-10-CM | POA: Insufficient documentation

## 2017-12-16 DIAGNOSIS — M1990 Unspecified osteoarthritis, unspecified site: Secondary | ICD-10-CM | POA: Insufficient documentation

## 2017-12-16 DIAGNOSIS — K219 Gastro-esophageal reflux disease without esophagitis: Secondary | ICD-10-CM | POA: Insufficient documentation

## 2017-12-16 DIAGNOSIS — Z79899 Other long term (current) drug therapy: Secondary | ICD-10-CM | POA: Insufficient documentation

## 2017-12-16 DIAGNOSIS — I2510 Atherosclerotic heart disease of native coronary artery without angina pectoris: Secondary | ICD-10-CM | POA: Insufficient documentation

## 2017-12-16 DIAGNOSIS — Z87891 Personal history of nicotine dependence: Secondary | ICD-10-CM | POA: Insufficient documentation

## 2017-12-16 DIAGNOSIS — F329 Major depressive disorder, single episode, unspecified: Secondary | ICD-10-CM | POA: Insufficient documentation

## 2017-12-16 DIAGNOSIS — I252 Old myocardial infarction: Secondary | ICD-10-CM | POA: Diagnosis not present

## 2017-12-16 DIAGNOSIS — Z951 Presence of aortocoronary bypass graft: Secondary | ICD-10-CM | POA: Insufficient documentation

## 2017-12-16 DIAGNOSIS — Z955 Presence of coronary angioplasty implant and graft: Secondary | ICD-10-CM | POA: Diagnosis not present

## 2017-12-18 ENCOUNTER — Encounter (HOSPITAL_COMMUNITY)
Admission: RE | Admit: 2017-12-18 | Discharge: 2017-12-18 | Disposition: A | Discharge status: Home / Self Care | Payer: BLUE CROSS/BLUE SHIELD | Source: Ambulatory Visit | Attending: Internal Medicine | Admitting: Internal Medicine

## 2017-12-18 DIAGNOSIS — F329 Major depressive disorder, single episode, unspecified: Secondary | ICD-10-CM | POA: Diagnosis not present

## 2017-12-18 DIAGNOSIS — K219 Gastro-esophageal reflux disease without esophagitis: Secondary | ICD-10-CM | POA: Diagnosis not present

## 2017-12-18 DIAGNOSIS — I2510 Atherosclerotic heart disease of native coronary artery without angina pectoris: Secondary | ICD-10-CM | POA: Diagnosis not present

## 2017-12-18 DIAGNOSIS — Z951 Presence of aortocoronary bypass graft: Secondary | ICD-10-CM

## 2017-12-18 DIAGNOSIS — E785 Hyperlipidemia, unspecified: Secondary | ICD-10-CM | POA: Diagnosis not present

## 2017-12-18 DIAGNOSIS — I252 Old myocardial infarction: Secondary | ICD-10-CM | POA: Diagnosis not present

## 2017-12-18 DIAGNOSIS — M1990 Unspecified osteoarthritis, unspecified site: Secondary | ICD-10-CM | POA: Diagnosis not present

## 2017-12-18 DIAGNOSIS — Z955 Presence of coronary angioplasty implant and graft: Secondary | ICD-10-CM | POA: Diagnosis not present

## 2017-12-18 DIAGNOSIS — Z87891 Personal history of nicotine dependence: Secondary | ICD-10-CM | POA: Diagnosis not present

## 2017-12-18 DIAGNOSIS — Z79899 Other long term (current) drug therapy: Secondary | ICD-10-CM | POA: Diagnosis not present

## 2017-12-18 DIAGNOSIS — Z7902 Long term (current) use of antithrombotics/antiplatelets: Secondary | ICD-10-CM | POA: Diagnosis not present

## 2017-12-21 ENCOUNTER — Encounter (HOSPITAL_COMMUNITY)
Admission: RE | Admit: 2017-12-21 | Discharge: 2017-12-21 | Disposition: A | Discharge status: Home / Self Care | Payer: BLUE CROSS/BLUE SHIELD | Source: Ambulatory Visit | Attending: Internal Medicine | Admitting: Internal Medicine

## 2017-12-21 DIAGNOSIS — K219 Gastro-esophageal reflux disease without esophagitis: Secondary | ICD-10-CM | POA: Diagnosis not present

## 2017-12-21 DIAGNOSIS — Z79899 Other long term (current) drug therapy: Secondary | ICD-10-CM | POA: Diagnosis not present

## 2017-12-21 DIAGNOSIS — Z955 Presence of coronary angioplasty implant and graft: Secondary | ICD-10-CM | POA: Diagnosis not present

## 2017-12-21 DIAGNOSIS — I2510 Atherosclerotic heart disease of native coronary artery without angina pectoris: Secondary | ICD-10-CM | POA: Diagnosis not present

## 2017-12-21 DIAGNOSIS — Z7902 Long term (current) use of antithrombotics/antiplatelets: Secondary | ICD-10-CM | POA: Diagnosis not present

## 2017-12-21 DIAGNOSIS — M1990 Unspecified osteoarthritis, unspecified site: Secondary | ICD-10-CM | POA: Diagnosis not present

## 2017-12-21 DIAGNOSIS — Z951 Presence of aortocoronary bypass graft: Secondary | ICD-10-CM | POA: Diagnosis not present

## 2017-12-21 DIAGNOSIS — E785 Hyperlipidemia, unspecified: Secondary | ICD-10-CM | POA: Diagnosis not present

## 2017-12-21 DIAGNOSIS — F329 Major depressive disorder, single episode, unspecified: Secondary | ICD-10-CM | POA: Diagnosis not present

## 2017-12-21 DIAGNOSIS — I252 Old myocardial infarction: Secondary | ICD-10-CM | POA: Diagnosis not present

## 2017-12-21 DIAGNOSIS — Z87891 Personal history of nicotine dependence: Secondary | ICD-10-CM | POA: Diagnosis not present

## 2017-12-22 ENCOUNTER — Telehealth (HOSPITAL_COMMUNITY): Payer: Self-pay

## 2017-12-22 NOTE — Telephone Encounter (Signed)
Patient called and left message in regards to Insurance. Returned patients phone call - lm on vm

## 2017-12-23 ENCOUNTER — Encounter (HOSPITAL_COMMUNITY)
Admission: RE | Admit: 2017-12-23 | Discharge: 2017-12-23 | Disposition: A | Discharge status: Home / Self Care | Payer: BLUE CROSS/BLUE SHIELD | Source: Ambulatory Visit | Attending: Internal Medicine | Admitting: Internal Medicine

## 2017-12-23 DIAGNOSIS — Z7902 Long term (current) use of antithrombotics/antiplatelets: Secondary | ICD-10-CM | POA: Diagnosis not present

## 2017-12-23 DIAGNOSIS — Z951 Presence of aortocoronary bypass graft: Secondary | ICD-10-CM

## 2017-12-23 DIAGNOSIS — I252 Old myocardial infarction: Secondary | ICD-10-CM | POA: Diagnosis not present

## 2017-12-23 DIAGNOSIS — Z87891 Personal history of nicotine dependence: Secondary | ICD-10-CM | POA: Diagnosis not present

## 2017-12-23 DIAGNOSIS — Z79899 Other long term (current) drug therapy: Secondary | ICD-10-CM | POA: Diagnosis not present

## 2017-12-23 DIAGNOSIS — E785 Hyperlipidemia, unspecified: Secondary | ICD-10-CM | POA: Diagnosis not present

## 2017-12-23 DIAGNOSIS — I2510 Atherosclerotic heart disease of native coronary artery without angina pectoris: Secondary | ICD-10-CM | POA: Diagnosis not present

## 2017-12-23 DIAGNOSIS — Z955 Presence of coronary angioplasty implant and graft: Secondary | ICD-10-CM | POA: Diagnosis not present

## 2017-12-23 DIAGNOSIS — K219 Gastro-esophageal reflux disease without esophagitis: Secondary | ICD-10-CM | POA: Diagnosis not present

## 2017-12-23 DIAGNOSIS — M1990 Unspecified osteoarthritis, unspecified site: Secondary | ICD-10-CM | POA: Diagnosis not present

## 2017-12-23 DIAGNOSIS — F329 Major depressive disorder, single episode, unspecified: Secondary | ICD-10-CM | POA: Diagnosis not present

## 2017-12-25 ENCOUNTER — Telehealth (HOSPITAL_COMMUNITY): Payer: Self-pay | Admitting: Cardiac Rehabilitation

## 2017-12-25 ENCOUNTER — Encounter (HOSPITAL_COMMUNITY)
Admission: RE | Admit: 2017-12-25 | Discharge: 2017-12-25 | Disposition: A | Discharge status: Home / Self Care | Payer: BLUE CROSS/BLUE SHIELD | Source: Ambulatory Visit | Attending: Internal Medicine | Admitting: Internal Medicine

## 2017-12-25 VITALS — Wt 174.7 lb

## 2017-12-25 DIAGNOSIS — Z79899 Other long term (current) drug therapy: Secondary | ICD-10-CM | POA: Diagnosis not present

## 2017-12-25 DIAGNOSIS — F329 Major depressive disorder, single episode, unspecified: Secondary | ICD-10-CM | POA: Diagnosis not present

## 2017-12-25 DIAGNOSIS — K219 Gastro-esophageal reflux disease without esophagitis: Secondary | ICD-10-CM | POA: Diagnosis not present

## 2017-12-25 DIAGNOSIS — E785 Hyperlipidemia, unspecified: Secondary | ICD-10-CM | POA: Diagnosis not present

## 2017-12-25 DIAGNOSIS — Z87891 Personal history of nicotine dependence: Secondary | ICD-10-CM | POA: Diagnosis not present

## 2017-12-25 DIAGNOSIS — I252 Old myocardial infarction: Secondary | ICD-10-CM | POA: Diagnosis not present

## 2017-12-25 DIAGNOSIS — M1990 Unspecified osteoarthritis, unspecified site: Secondary | ICD-10-CM | POA: Diagnosis not present

## 2017-12-25 DIAGNOSIS — Z951 Presence of aortocoronary bypass graft: Secondary | ICD-10-CM

## 2017-12-25 DIAGNOSIS — Z955 Presence of coronary angioplasty implant and graft: Secondary | ICD-10-CM | POA: Diagnosis not present

## 2017-12-25 DIAGNOSIS — I2510 Atherosclerotic heart disease of native coronary artery without angina pectoris: Secondary | ICD-10-CM | POA: Diagnosis not present

## 2017-12-25 DIAGNOSIS — Z7902 Long term (current) use of antithrombotics/antiplatelets: Secondary | ICD-10-CM | POA: Diagnosis not present

## 2017-12-25 NOTE — Telephone Encounter (Signed)
-----   Message from [name], NP sent at 12/03/2017  6:12 AM EDT ----- Regarding: RE: cardiac rehab  Thanks for letting me know   Tommi Rumps  ----- Message ----- From: Lowell Guitar, RN Sent: 12/02/2017   4:23 PM To: [name], NP Subject: cardiac rehab                                  Dear Tommi Rumps,  I scheduled pt for follow up appt 12/03/2017  with you to discuss depression/anxiety, reflux symptoms and throat congestion. Pt also notes some dizziness at times.  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   Pt arrived at cardiac rehab today tearful, her daughter frustrated that her mother is not taking good care of herself.   Pt concerned because she has frequent episodes of crying and hysteria and feels smothered by her daughters reactions.  Pt states "I dont feel the same since my surgery"  Pt is seen by psychiatry however does not feel current treatment effective. Not currently being followed by therapy.  Counseling appt has been made for her.    Thank you, Andi Hence, RN, BSN Cardiac Pulmonary Rehab

## 2017-12-28 ENCOUNTER — Telehealth (HOSPITAL_COMMUNITY): Payer: Self-pay | Admitting: Adult Health

## 2017-12-28 ENCOUNTER — Encounter (HOSPITAL_COMMUNITY): Payer: BLUE CROSS/BLUE SHIELD

## 2017-12-29 ENCOUNTER — Other Ambulatory Visit: Payer: BLUE CROSS/BLUE SHIELD

## 2017-12-30 ENCOUNTER — Encounter (HOSPITAL_COMMUNITY): Payer: BLUE CROSS/BLUE SHIELD

## 2018-01-01 ENCOUNTER — Encounter (HOSPITAL_COMMUNITY): Payer: BLUE CROSS/BLUE SHIELD

## 2018-01-04 ENCOUNTER — Encounter (HOSPITAL_COMMUNITY): Payer: BLUE CROSS/BLUE SHIELD

## 2018-01-06 ENCOUNTER — Encounter (HOSPITAL_COMMUNITY): Payer: BLUE CROSS/BLUE SHIELD

## 2018-01-06 DIAGNOSIS — F332 Major depressive disorder, recurrent severe without psychotic features: Secondary | ICD-10-CM | POA: Diagnosis not present

## 2018-01-08 ENCOUNTER — Encounter (HOSPITAL_COMMUNITY): Payer: BLUE CROSS/BLUE SHIELD

## 2018-01-08 ENCOUNTER — Telehealth (HOSPITAL_COMMUNITY): Payer: Self-pay | Admitting: Cardiac Rehabilitation

## 2018-01-08 NOTE — Telephone Encounter (Signed)
pc to pt to assess reason for continued absence from cardiac rehab. Pt states she received letter from [facility] that she has been dropped from program for absences.  Pt also states she received large hospital bill for services previously received.  Pt reports she is not interested in participating in program at this time. Pt reassured she has not been discharged from program for non attendance.  However the decision is hers if she chooses to continue.  Pt encouraged to attend class at next scheduled session.  Pt verbalized understanding. Andi Hence, RN, BSN Cardiac Pulmonary Rehab 01/08/18

## 2018-01-18 ENCOUNTER — Encounter (HOSPITAL_COMMUNITY): Payer: Self-pay

## 2018-01-18 NOTE — Addendum Note (Signed)
Encounter addended by: Lowell Guitar, RN on: 01/18/2018 5:08 PM  Actions taken: Flowsheet data copied forward, Visit Navigator Flowsheet section accepted

## 2018-01-18 NOTE — Addendum Note (Signed)
Encounter addended by: Lowell Guitar, RN on: 01/18/2018 5:06 PM  Actions taken: Visit Navigator Flowsheet section accepted, Vitals modified

## 2018-01-22 NOTE — Addendum Note (Signed)
Encounter addended by: Dorna Bloom D on: 01/22/2018 3:41 PM  Actions taken: Flowsheet data copied forward, Flowsheet accepted, Visit Navigator Flowsheet section accepted

## 2018-01-26 ENCOUNTER — Encounter: Payer: Self-pay | Admitting: Adult Health

## 2018-01-26 ENCOUNTER — Ambulatory Visit: Payer: BLUE CROSS/BLUE SHIELD | Admitting: Adult Health

## 2018-01-26 VITALS — BP 116/60 | Temp 98.1°F | Wt 171.0 lb

## 2018-01-26 DIAGNOSIS — S80862A Insect bite (nonvenomous), left lower leg, initial encounter: Secondary | ICD-10-CM | POA: Diagnosis not present

## 2018-01-26 DIAGNOSIS — E538 Deficiency of other specified B group vitamins: Secondary | ICD-10-CM

## 2018-01-26 DIAGNOSIS — W57XXXA Bitten or stung by nonvenomous insect and other nonvenomous arthropods, initial encounter: Secondary | ICD-10-CM | POA: Diagnosis not present

## 2018-01-26 MED ORDER — CYANOCOBALAMIN 1000 MCG/ML IJ SOLN
1000.0000 ug | Freq: Once | INTRAMUSCULAR | Status: AC
  Administered 2018-01-26: 1000 ug via INTRAMUSCULAR

## 2018-01-26 MED ORDER — DOXYCYCLINE HYCLATE 100 MG PO CAPS: 100.0000 mg | ORAL_CAPSULE | Freq: Two times a day (BID) | ORAL | 0 refills | Status: DC

## 2018-01-26 NOTE — Addendum Note (Signed)
Addended by: Miles Costain T on: 01/26/2018 05:25 PM   Modules accepted: Orders

## 2018-01-26 NOTE — Progress Notes (Signed)
Subjective:    Patient ID: Bianca [name], female    DOB: 09/11/1945, 72 y.o.   MRN: [id number]  HPI   72 year old female who  has a past medical history of Arthritis, Blood transfusion without reported diagnosis, CAD (coronary artery disease), Carpal tunnel syndrome on both sides, Chronic lower back pain, Depression, GERD (gastroesophageal reflux disease), Hyperlipidemia, Myocardial infarction ([place]) (1997, 2007, 2013), and Numbness of foot.  She presents to the office today for concern of tick bite to the left lower leg. She reports that she removed a tick about 2 weeks ago. Since that time she has experienced redness and itching around the site. She has not noticed any bulls eye rashes. She is unsure of how long tick was attached. Denies any fevers, body aches, or rashes.   She also would like to be restarted on B12 injections   Review of Systems See HPI   Past Medical History:  Diagnosis Date  . Arthritis   . Blood transfusion without reported diagnosis   . CAD (coronary artery disease)    a. 1997 MI/PCI RCA;  b. 2007 MI/PCI of 100% RCA with Taxus DES x 3 placed, EF 55%;  c. 2010 Cath: stable anatomy;  d. 05/2012 NSTEMI/Cath/PCI: LM nl, LAD 60-60m, D1 20ost, LCX 24m, RCA 40-26m ISR, 60/95d (Treated w/ 2.75x33 Xience Xpedition DES), PDA 4m (Treated w/ PTCA), EF 60%.  . Carpal tunnel syndrome on both sides   . Chronic lower back pain   . Depression   . GERD (gastroesophageal reflux disease)   . Hyperlipidemia   . Myocardial infarction [facility]) 1997, 2007, 2013  . Numbness of foot    Left foot - back surgery 2009    Social History   Socioeconomic History  . Marital status: Married    Spouse name: Not on file  . Number of children: Not on file  . Years of education: Not on file  . Highest education level: Not on file  Occupational History  . Not on file  Social Needs  . Financial resource strain: Not on file  . Food insecurity:    Worry: Not on file    Inability: Not on  file  . Transportation needs:    Medical: Not on file    Non-medical: Not on file  Tobacco Use  . Smoking status: Former Smoker    Packs/day: 0.50    Years: 50.00    Pack years: 25.00    Types: Cigarettes    Last attempt to quit: 06/01/2017    Years since quitting: 0.6  . Smokeless tobacco: Never Used  Substance and Sexual Activity  . Alcohol use: No    Alcohol/week: 0.0 oz  . Drug use: No  . Sexual activity: Not Currently    Birth control/protection: Post-menopausal  Lifestyle  . Physical activity:    Days per week: Not on file    Minutes per session: Not on file  . Stress: Not on file  Relationships  . Social connections:    Talks on phone: Not on file    Gets together: Not on file    Attends religious service: Not on file    Active member of club or organization: Not on file    Attends meetings of clubs or organizations: Not on file    Relationship status: Not on file  . Intimate partner violence:    Fear of current or ex partner: Not on file    Emotionally abused: Not on file  Physically abused: Not on file    Forced sexual activity: Not on file  Other Topics Concern  . Not on file  Social History Narrative   Is a homemaker   Married for 41 years    Has a daughter and son    Pets: Two dogs and a Neurosurgeon   Likes to garden ( flower beds).        Past Surgical History:  Procedure Laterality Date  . ANKLE SURGERY  Years ago   Left; "had to take out a floater"  . CARPAL TUNNEL RELEASE Left 10/12016  . CESAREAN SECTION  1990  . CORONARY ANGIOPLASTY WITH STENT PLACEMENT  1997; 2007, 2013   "2 + 2; + 1= total of 5  . CORONARY ARTERY BYPASS GRAFT N/A 05/26/2017   Procedure: CORONARY ARTERY BYPASS GRAFTING (CABG), ON PUMP, TIMES Three, using left internal mammary artery and right greater saphenous vein harvested endoscopically;  Surgeon: [name], MD;  Location: [facility];  Service: Open Heart Surgery;  Laterality: N/A;  LIMA to LAD, SVG to OM 1, SVG to PDA  .  LEFT HEART CATH AND CORONARY ANGIOGRAPHY N/A 05/26/2017   Procedure: LEFT HEART CATH AND CORONARY ANGIOGRAPHY;  Surgeon: Belva Crome, MD;  Location: [facility];  Service: Cardiovascular;  Laterality: N/A;  . LEFT HEART CATHETERIZATION WITH CORONARY ANGIOGRAM N/A 05/18/2012   Procedure: LEFT HEART CATHETERIZATION WITH CORONARY ANGIOGRAM;  Surgeon: Wellington Hampshire, MD;  Location: [facility];  Service: Cardiovascular;  Laterality: N/A;  . [place]; 2009  . TEE WITHOUT CARDIOVERSION N/A 05/26/2017   Procedure: TRANSESOPHAGEAL ECHOCARDIOGRAM (TEE);  Surgeon: [name], MD;  Location: [place];  Service: Open Heart Surgery;  Laterality: N/A;  . TOTAL HIP ARTHROPLASTY Right 05/24/2014   Procedure: RIGHT TOTAL HIP ARTHROPLASTY ANTERIOR APPROACH;  Surgeon: Gearlean Alf, MD;  Location: [facility];  Service: Orthopedics;  Laterality: Right;    Family History  Problem Relation Age of Onset  . Cancer Paternal Grandfather        Esophageal  . Coronary artery disease Mother   . Diabetes Mother   . Hypertension Mother   . Dementia Father   . Sudden death Brother        Suicide    Allergies  Allergen Reactions  . Oxycodone Hcl Itching and Other (See Comments)    Confusion, hallucinations  . Penicillins Itching and Rash    Has patient had a PCN reaction causing immediate rash, facial/tongue/throat swelling, SOB or lightheadedness with hypotension: yes rash that took a while to go away across the abdomen Has patient had a PCN reaction causing severe rash involving mucus membranes or skin necrosis: no Has patient had a PCN reaction that required hospitalization: no Has patient had a PCN reaction occurring within the last 10 years: No If all of the above answers are "NO", then may proceed with Cephalosporin use.   . Tramadol Itching and Other (See Comments)    hallucinations  . Aspirin Nausea And Vomiting and Other (See Comments)    Burning in stomach-  tolerates enteric coated  . Macrobid [Nitrofurantoin Monohyd Macro] Nausea And Vomiting  . Sulfa Antibiotics Nausea Only    Current Outpatient Medications on File Prior to Visit  Medication Sig Dispense Refill  . acetaminophen (TYLENOL) 325 MG tablet Take 2 tablets (650 mg total) by mouth every 6 (six) hours as needed for fever, headache, mild pain or moderate pain.    Marland Kitchen  ALPRAZolam (XANAX) 0.25 MG tablet Take 1 tablet (0.25 mg total) by mouth 2 (two) times daily as needed for up to 30 doses for anxiety. 30 tablet 0  . atorvastatin (LIPITOR) 80 MG tablet TAKE 1 TABLET BY MOUTH EVERY DAY AT 6PM 90 tablet 3  . baclofen (LIORESAL) 10 MG tablet Take 10 mg by mouth at bedtime.  0  . buPROPion (WELLBUTRIN XL) 300 MG 24 hr tablet Take 300 mg by mouth daily.  1  . clopidogrel (PLAVIX) 75 MG tablet Take 1 tablet (75 mg total) by mouth daily. 90 tablet 3  . cyanocobalamin (,VITAMIN B-12,) 1000 MCG/ML injection Inject 1,000 mcg into the muscle every 30 (thirty) days. Vitamin B12   - last injection 05/22/17    . dexlansoprazole (DEXILANT) 60 MG capsule Take 60 mg by mouth daily as needed (acid reflux).    . gabapentin (NEURONTIN) 300 MG capsule Take 300 mg by mouth at bedtime as needed (pain).    . hydrOXYzine (ATARAX/VISTARIL) 25 MG tablet Take 25 mg by mouth at bedtime as needed for anxiety or itching.    . meclizine (ANTIVERT) 25 MG tablet Take 25 mg by mouth 2 (two) times daily as needed for dizziness.    . Methen-Hyosc-Meth Blue-Na Phos (ME/NAPHOS/MB/HYO1) 81.6 MG TABS Take 1 tablet by mouth every 6 (six) hours as needed.  9  . Multiple Vitamin (MULTIVITAMIN WITH MINERALS) TABS tablet Take 1 tablet by mouth daily.    . Multiple Vitamins-Minerals (MACULAR HEALTH FORMULA) CAPS Take 1 capsule by mouth daily.    . pentosan polysulfate (ELMIRON) 100 MG capsule Take 200 mg by mouth 2 (two) times daily.    . polyvinyl alcohol (ARTIFICIAL TEARS) 1.4 % ophthalmic solution Place 1 drop into both eyes 2 (two)  times daily as needed for dry eyes.    . Probiotic Product (PROBIOTIC PO) Take 1 capsule by mouth at bedtime. Nature [employer]'s brand    . Red Yeast Rice Extract (RED YEAST RICE PO) Take 1 capsule by mouth at bedtime.    . traZODone (DESYREL) 50 MG tablet Take 100 mg by mouth at bedtime.   0   No current facility-administered medications on file prior to visit.     BP 116/60   Temp 98.1 F (36.7 C) (Oral)   Wt 171 lb (77.6 kg)   LMP  (LMP Unknown)   BMI 31.28 kg/m       Objective:   Physical Exam  Constitutional: She is oriented to person, place, and time. She appears well-developed and well-nourished. No distress.  Neurological: She is alert and oriented to person, place, and time.  Skin: Skin is warm and dry. She is not diaphoretic. There is erythema.  Localized erythema noted to left upper leg. Dime sized non fluctuant abscess noted. No bulls eye rash noted.   No tick remnants noted    Psychiatric: She has a normal mood and affect. Her behavior is normal. Judgment and thought content normal.  Nursing note and vitals reviewed.     Assessment & Plan:   1. Tick bite of left lower leg, initial encounter - Will treat due to unknown time frame of being attached as well as small abscess.  - doxycycline (VIBRAMYCIN) 100 MG capsule; Take 1 capsule (100 mg total) by mouth 2 (two) times daily.  Dispense: 14 capsule; Refill: 0 - Return precautions given   2. B12 deficiency  - Vitamin B12  [name], NP

## 2018-01-27 LAB — VITAMIN B12: Vitamin B-12: >1500 pg/mL — ABNORMAL HIGH (ref 211–911)

## 2018-01-29 NOTE — Addendum Note (Signed)
Encounter addended by: Lowell Guitar, RN on: 01/29/2018 3:44 PM  Actions taken: Sign clinical note

## 2018-01-29 NOTE — Addendum Note (Signed)
Encounter addended by: Ivonne Andrew, RD on: 01/29/2018 12:19 PM  Actions taken: Flowsheet data copied forward, Visit Navigator Flowsheet section accepted

## 2018-01-29 NOTE — Progress Notes (Signed)
Discharge Progress Report  Patient Details  Name: Bianca [name] MRN: [id number] Date of Birth: April 04, 1946 Referring Provider:   Flowsheet Row CARDIAC REHAB PHASE II ORIENTATION from 10/13/2017 in [place]  Referring Provider  Dorris Carnes MD       Number of Visits: 21   Reason for Discharge:  Early Exit:  Lack of attendance, pt concerned about insurance copayment   Smoking History:  Social History   Tobacco Use  Smoking Status Former Smoker  . Packs/day: 0.50  . Years: 50.00  . Pack years: 25.00  . Types: Cigarettes  . Last attempt to quit: 06/01/2017  . Years since quitting: 0.6  Smokeless Tobacco Never Used    Diagnosis:  S/P CABG x 3  ADL UCSD:   Initial Exercise Prescription: Initial Exercise Prescription - 10/13/17 1200    Date of Initial Exercise RX and Referring Provider          Date  10/13/17    Referring Provider  Dorris Carnes MD        Recumbant Bike          Level  1    Minutes  15    METs  1        NuStep          Level  2    Minutes  15    METs  1.4        Track          Laps  8    Minutes  15    METs  2        Prescription Details          Frequency (times per week)  3    Duration  Progress to 30 minutes of continuous aerobic without signs/symptoms of physical distress        Intensity          THRR 40-80% of Max Heartrate  60-119    Ratings of Perceived Exertion  11-13    Perceived Dyspnea  0-4        Progression          Progression  Continue to progress workloads to maintain intensity without signs/symptoms of physical distress.        Resistance Training          Training Prescription  Yes    Weight  2lbs    Reps  10-15           Discharge Exercise Prescription (Final Exercise Prescription Changes): Exercise Prescription Changes - 12/25/17 1459    Response to Exercise          Blood Pressure (Admit)  120/80    Blood Pressure (Exercise)  114/60    Blood Pressure (Exit)   114/60    Heart Rate (Admit)  76 bpm    Heart Rate (Exercise)  82 bpm    Heart Rate (Exit)  73 bpm    Rating of Perceived Exertion (Exercise)  13    Duration  Progress to 30 minutes of  aerobic without signs/symptoms of physical distress    Intensity  THRR unchanged        Progression          Progression  Continue to progress workloads to maintain intensity without signs/symptoms of physical distress.    Average METs  2.6        Resistance Training          Training Prescription  Yes    Weight  2lbs    Reps  10-15    Time  10 Minutes        Recumbant Bike          Level  2.5    Minutes  15    METs  2.89        NuStep          Level  3    Minutes  15    METs  2.3        Home Exercise Plan          Plans to continue exercise at  Home (comment) walking 3(10'bouts), chair exercises     Frequency  Add 3 additional days to program exercise sessions.    Initial Home Exercises Provided  11/03/17           Functional Capacity: 6 Minute Walk    6 Minute Walk    Row Name 10/13/17 1152 10/13/17 1218 10/13/17 1237   Phase  Initial  no documentation  Initial   Distance  907 feet  no documentation  907 feet   Walk Time  6 minutes  4.47 minutes  4.47 minutes   # of Rest Breaks  0  2  2   MPH  1.7  2.3  2.3   METS  1.6  1.7  1.7   RPE  11  11  11    Perceived Dyspnea   1  1  no documentation   VO2 Peak  5.7  5.9  2.9   Symptoms  Yes (comment)  Yes (comment)  Yes (comment)   Comments  pt took 2 short rest breaks, 1st break at 43 seconds and 2nd break at 30 seconds  pt took 2 short rest breaks, 1st break at 43 seconds and 2nd break at 30 seconds; mild SOB; dry mouth  pt took 2 short rest breaks, 1st break at 43 seconds and 2nd break at 30 seconds; mild SOB; dry mouth   Resting HR  64 bpm  no documentation  64 bpm   Resting BP  102/64  no documentation  102/64   Resting Oxygen Saturation   91 %  no documentation  91 %   Exercise Oxygen Saturation  during 6 min walk  93 %   no documentation  93 %   Max Ex. HR  83 bpm  no documentation  83 bpm   Max Ex. BP  148/60  no documentation  148/60   2 Minute Post BP  118/70  no documentation  118/70          Psychological, QOL, Others - Outcomes: PHQ 2/9: Depression screen [facility] 2/9 10/21/2017 01/24/2015  Decreased Interest 1 1  Down, Depressed, Hopeless 3 1  PHQ - 2 Score 4 2  Altered sleeping 1 3  Tired, decreased energy 1 3  Change in appetite 0 0  Feeling bad or failure about yourself  0 0  Trouble concentrating 1 0  Moving slowly or fidgety/restless 0 0  Suicidal thoughts 0 0  PHQ-9 Score 7 8  Difficult doing work/chores Somewhat difficult -  Some recent data might be hidden    Quality of Life: Quality of Life - 10/30/17 1446    Quality of Life Scores          Health/Function Pre  18.17 % pt mostly concerned about dyspnea.      Socioeconomic Pre  28.94 %    Psych/Spiritual Pre  27.93 %  Family Pre  14.4 % pt concerned about family dynamic and role changes with her cardiac illness.  will plan home exercise instruction next week with family present to help clarify pt expectations, capabilities and limitations at this time.      GLOBAL Pre  22.04 % pt offered emotional support and reassurance. pt offered counseling with Jeanella Craze.  pt will let us know if she is interested in making appointment.             Personal Goals: Goals established at orientation with interventions provided to work toward goal. Personal Goals and Risk Factors at Admission - 10/13/17 1215    Core Components/Risk Factors/Patient Goals on Admission           Weight Management  Yes;Obesity;Weight Maintenance;Weight Loss    Intervention  Weight Management: Develop a combined nutrition and exercise program designed to reach desired caloric intake, while maintaining appropriate intake of nutrient and fiber, sodium and fats, and appropriate energy expenditure required for the weight goal.;Weight Management: Provide education and  appropriate resources to help participant work on and attain dietary goals.;Weight Management/Obesity: Establish reasonable short term and long term weight goals.;Obesity: Provide education and appropriate resources to help participant work on and attain dietary goals.    Admit Weight  185 lb 3 oz (84 kg)    Goal Weight: Short Term  180 lb (81.6 kg)    Goal Weight: Long Term  165 lb (74.8 kg)    Expected Outcomes  Short Term: Continue to assess and modify interventions until short term weight is achieved;Long Term: Adherence to nutrition and physical activity/exercise program aimed toward attainment of established weight goal;Weight Maintenance: Understanding of the daily nutrition guidelines, which includes 25-35% calories from fat, 7% or less cal from saturated fats, less than 200mg  cholesterol, less than 1.5gm of sodium, & 5 or more servings of fruits and vegetables daily;Weight Loss: Understanding of general recommendations for a balanced deficit meal plan, which promotes 1-2 lb weight loss per week and includes a negative energy balance of [phone] kcal/d;Understanding recommendations for meals to include 15-35% energy as protein, 25-35% energy from fat, 35-60% energy from carbohydrates, less than 200mg  of dietary cholesterol, 20-35 gm of total fiber daily;Understanding of distribution of calorie intake throughout the day with the consumption of 4-5 meals/snacks    Improve shortness of breath with ADL's  Yes    Intervention  Provide education, individualized exercise plan and daily activity instruction to help decrease symptoms of SOB with activities of daily living.    Expected Outcomes  Short Term: Improve cardiorespiratory fitness to achieve a reduction of symptoms when performing ADLs;Long Term: Be able to perform more ADLs without symptoms or delay the onset of symptoms    Hypertension  Yes    Intervention  Provide education on lifestyle modifcations including regular physical activity/exercise,  weight management, moderate sodium restriction and increased consumption of fresh fruit, vegetables, and low fat dairy, alcohol moderation, and smoking cessation.;Monitor prescription use compliance.    Expected Outcomes  Short Term: Continued assessment and intervention until BP is < 140/91mm HG in hypertensive participants. < 130/105mm HG in hypertensive participants with diabetes, heart failure or chronic kidney disease.;Long Term: Maintenance of blood pressure at goal levels.    Lipids  Yes    Intervention  Provide education and support for participant on nutrition & aerobic/resistive exercise along with prescribed medications to achieve LDL 70mg , HDL >40mg .    Expected Outcomes  Short Term: Participant states understanding of desired cholesterol  values and is compliant with medications prescribed. Participant is following exercise prescription and nutrition guidelines.;Long Term: Cholesterol controlled with medications as prescribed, with individualized exercise RX and with personalized nutrition plan. Value goals: LDL < 70mg , HDL > 40 mg.    Stress  Yes    Intervention  Offer individual and/or small group education and counseling on adjustment to heart disease, stress management and health-related lifestyle change. Teach and support self-help strategies.;Refer participants experiencing significant psychosocial distress to appropriate mental health specialists for further evaluation and treatment. When possible, include family members and significant others in education/counseling sessions.    Expected Outcomes  Short Term: Participant demonstrates changes in health-related behavior, relaxation and other stress management skills, ability to obtain effective social support, and compliance with psychotropic medications if prescribed.;Long Term: Emotional wellbeing is indicated by absence of clinically significant psychosocial distress or social isolation.            Personal Goals Discharge: Goals  and Risk Factor Review    Core Components/Risk Factors/Patient Goals Review    Row Name 10/21/17 1501 10/27/17 1359 11/16/17 1537 12/03/17 0742 01/18/18 1707   Personal Goals Review  Weight Management/Obesity;Improve shortness of breath with ADL's;Hypertension;Lipids;Stress  Weight Management/Obesity;Improve shortness of breath with ADL's;Hypertension;Lipids;Stress  Weight Management/Obesity;Improve shortness of breath with ADL's;Hypertension;Lipids;Stress  Weight Management/Obesity;Improve shortness of breath with ADL's;Hypertension;Lipids;Stress  Weight Management/Obesity;Improve shortness of breath with ADL's;Hypertension;Lipids;Stress   Review  pt with multiple CAD RF demonstrates willingness to participate in CR program. pt personal goals are to decrease dyspnea and increase physical activity/strength/stamina.   pt with multiple CAD RF demonstrates willingness to participate in CR program. pt personal goals are to decrease dyspnea and increase physical activity/strength/stamina. pt reports her daughter has been advocate for her participating in home exercise.    pt with multiple CAD RF demonstrates willingness to participate in CR program. pt personal goals are to decrease dyspnea and increase physical activity/strength/stamina. pt reports she has recently been diagnosed with pre-diabetes.  pt educated on importance of eliminating sugars and white starches from diet.    pt with multiple CAD RF demonstrates willingness to participate in CR program. pt personal goals are to decrease dyspnea and increase physical activity/strength/stamina. pt reports she has recently been diagnosed with pre-diabetes.  pt educated on importance of eliminating sugars and white starches from diet. pt admits to occasional tobacco use. pt cautioned against this however also congratulated on her efforts thus far, with pt having made significant changes in the past year.     pt with multiple CAD RF exited program early due to  insurance copayment. pt plans to exercise on her own at home.     Expected Outcomes  pt will participate in CR exercise, nutrition and lifestyle modification activities to decrease overall RF.   pt will participate in CR exercise, nutrition and lifestyle modification activities to decrease overall RF.   pt will participate in CR exercise, nutrition and lifestyle modification activities to decrease overall RF.   pt will participate in CR exercise, nutrition and lifestyle modification activities to decrease overall RF.   pt will participate in exercise, nutrition and lifestyle modification activities to decrease overall RF.           Exercise Goals and Review: Exercise Goals    Exercise Goals    Row Name 10/13/17 0903   Increase Physical Activity  Yes   Intervention  Provide advice, education, support and counseling about physical activity/exercise needs.;Develop an individualized exercise prescription for aerobic and resistive training based  on initial evaluation findings, risk stratification, comorbidities and participant's personal goals.   Expected Outcomes  Short Term: Attend rehab on a regular basis to increase amount of physical activity.;Long Term: Exercising regularly at least 3-5 days a week.;Long Term: Add in home exercise to make exercise part of routine and to increase amount of physical activity.   Increase Strength and Stamina  Yes   Intervention  Provide advice, education, support and counseling about physical activity/exercise needs.;Develop an individualized exercise prescription for aerobic and resistive training based on initial evaluation findings, risk stratification, comorbidities and participant's personal goals.   Expected Outcomes  Short Term: Increase workloads from initial exercise prescription for resistance, speed, and METs.;Short Term: Perform resistance training exercises routinely during rehab and add in resistance training at home;Long Term: Improve cardiorespiratory  fitness, muscular endurance and strength as measured by increased METs and functional capacity (6MWT)   Able to understand and use rate of perceived exertion (RPE) scale  Yes   Intervention  Provide education and explanation on how to use RPE scale   Expected Outcomes  Short Term: Able to use RPE daily in rehab to express subjective intensity level;Long Term:  Able to use RPE to guide intensity level when exercising independently   Able to understand and use Dyspnea scale  Yes   Intervention  Provide education and explanation on how to use Dyspnea scale   Expected Outcomes  Short Term: Able to use Dyspnea scale daily in rehab to express subjective sense of shortness of breath during exertion;Long Term: Able to use Dyspnea scale to guide intensity level when exercising independently   Knowledge and understanding of Target Heart Rate Range (THRR)  Yes   Intervention  Provide education and explanation of THRR including how the numbers were predicted and where they are located for reference   Expected Outcomes  Short Term: Able to state/look up THRR;Long Term: Able to use THRR to govern intensity when exercising independently;Short Term: Able to use daily as guideline for intensity in rehab   Able to check pulse independently  Yes   Intervention  Provide education and demonstration on how to check pulse in carotid and radial arteries.;Review the importance of being able to check your own pulse for safety during independent exercise   Expected Outcomes  Short Term: Able to explain why pulse checking is important during independent exercise;Long Term: Able to check pulse independently and accurately   Understanding of Exercise Prescription  Yes   Intervention  Provide education, explanation, and written materials on patient's individual exercise prescription   Expected Outcomes  Short Term: Able to explain program exercise prescription;Long Term: Able to explain home exercise prescription to exercise  independently          Nutrition & Weight - Outcomes: Pre Biometrics - 01/22/18 1506    Pre Biometrics          Weight  174 lb 10.9 oz (79.2 kg)            Nutrition: Nutrition Therapy & Goals - 01/29/18 1158    Personal Nutrition Goals          Nutrition Goal  --           Nutrition Discharge: Nutrition Assessments - 01/29/18 1155    MEDFICTS Scores          Pre Score  30    Post Score  -- no post test to assess at this time           Education  Questionnaire Score: Knowledge Questionnaire Score - 10/13/17 1152    Knowledge Questionnaire Score          Pre Score  16/24           Goals reviewed with patient; copy given to patient.

## 2018-02-01 DIAGNOSIS — F332 Major depressive disorder, recurrent severe without psychotic features: Secondary | ICD-10-CM | POA: Diagnosis not present

## 2018-03-02 ENCOUNTER — Encounter: Payer: Self-pay | Admitting: Gastroenterology

## 2018-03-02 ENCOUNTER — Encounter (INDEPENDENT_AMBULATORY_CARE_PROVIDER_SITE_OTHER): Payer: Self-pay

## 2018-03-02 ENCOUNTER — Ambulatory Visit: Payer: BLUE CROSS/BLUE SHIELD | Admitting: Gastroenterology

## 2018-03-02 ENCOUNTER — Telehealth: Payer: Self-pay | Admitting: Gastroenterology

## 2018-03-02 VITALS — BP 116/70 | HR 76 | Ht 61.5 in | Wt 167.0 lb

## 2018-03-02 DIAGNOSIS — K5900 Constipation, unspecified: Secondary | ICD-10-CM | POA: Diagnosis not present

## 2018-03-02 DIAGNOSIS — R194 Change in bowel habit: Secondary | ICD-10-CM

## 2018-03-02 MED ORDER — PEG 3350-KCL-NA BICARB-NACL 420 G PO SOLR: 4000.0000 mL | ORAL | 0 refills | Status: DC

## 2018-03-02 NOTE — Progress Notes (Signed)
HPI: This is a very pleasant 72 year old woman whom I last saw at the time of an upper endoscopy about a year ago.  2-3 months, bowel changes. Constipation. Took dulcolax with good results.   Prior to this she was going every other day.  No rectal bleeding  Not on pain medicines.  No other medicines  Losing weight lately; 19 pounds in the past year, without intention.  She had a colonoscopy with Dr. Verl Blalock October 2011 for routine risk screening and it was completely normal.  He recommended that she have a repeat colonoscopy for colon cancer screening at 10-year interval.   I did an EGD for her June 2018 for dysphasia and epigastric pains.  Was completely normal.  She had an ultrasound in follow-up.  Chief complaint is change in bowels, constipation. ROS: complete GI ROS as described in HPI, all other review negative.  Constitutional:  No unintentional weight loss   Past Medical History:  Diagnosis Date  . Arthritis   . Blood transfusion without reported diagnosis   . CAD (coronary artery disease)    a. 1997 MI/PCI RCA;  b. 2007 MI/PCI of 100% RCA with Taxus DES x 3 placed, EF 55%;  c. 2010 Cath: stable anatomy;  d. 05/2012 NSTEMI/Cath/PCI: LM nl, LAD 60-63m, D1 20ost, LCX 76m, RCA 40-54m ISR, 60/95d (Treated w/ 2.75x33 Xience Xpedition DES), PDA 78m (Treated w/ PTCA), EF 60%.  . Carpal tunnel syndrome on both sides   . Chronic lower back pain   . Depression   . GERD (gastroesophageal reflux disease)   . Hyperlipidemia   . Myocardial infarction [facility]) 1997, 2007, 2013  . Numbness of foot    Left foot - back surgery 2009    Past Surgical History:  Procedure Laterality Date  . ANKLE SURGERY  Years ago   Left; "had to take out a floater"  . CARPAL TUNNEL RELEASE Left 10/12016  . CESAREAN SECTION  1990  . CORONARY ANGIOPLASTY WITH STENT PLACEMENT  1997; 2007, 2013   "2 + 2; + 1= total of 5  . CORONARY ARTERY BYPASS GRAFT N/A 05/26/2017   Procedure: CORONARY  ARTERY BYPASS GRAFTING (CABG), ON PUMP, TIMES Three, using left internal mammary artery and right greater saphenous vein harvested endoscopically;  Surgeon: [name], MD;  Location: [place];  Service: Open Heart Surgery;  Laterality: N/A;  LIMA to LAD, SVG to OM 1, SVG to PDA  . LEFT HEART CATH AND CORONARY ANGIOGRAPHY N/A 05/26/2017   Procedure: LEFT HEART CATH AND CORONARY ANGIOGRAPHY;  Surgeon: Belva Crome, MD;  Location: [place] CV LAB;  Service: Cardiovascular;  Laterality: N/A;  . LEFT HEART CATHETERIZATION WITH CORONARY ANGIOGRAM N/A 05/18/2012   Procedure: LEFT HEART CATHETERIZATION WITH CORONARY ANGIOGRAM;  Surgeon: Wellington Hampshire, MD;  Location: [place] CATH LAB;  Service: Cardiovascular;  Laterality: N/A;  . [place]; 2009  . TEE WITHOUT CARDIOVERSION N/A 05/26/2017   Procedure: TRANSESOPHAGEAL ECHOCARDIOGRAM (TEE);  Surgeon: [name], MD;  Location: [place];  Service: Open Heart Surgery;  Laterality: N/A;  . TOTAL HIP ARTHROPLASTY Right 05/24/2014   Procedure: RIGHT TOTAL HIP ARTHROPLASTY ANTERIOR APPROACH;  Surgeon: Gearlean Alf, MD;  [facility];  Service: Orthopedics;  Laterality: Right;    Current Outpatient Medications  Medication Sig Dispense Refill  . acetaminophen (TYLENOL) 325 MG tablet Take 2 tablets (650 mg total) by mouth every 6 (six) hours as needed for fever, headache, mild pain or  moderate pain.    Marland Kitchen ALPRAZolam (XANAX) 0.25 MG tablet Take 1 tablet (0.25 mg total) by mouth 2 (two) times daily as needed for up to 30 doses for anxiety. 30 tablet 0  . atorvastatin (LIPITOR) 80 MG tablet TAKE 1 TABLET BY MOUTH EVERY DAY AT 6PM 90 tablet 3  . baclofen (LIORESAL) 10 MG tablet Take 10 mg by mouth at bedtime.  0  . buPROPion (WELLBUTRIN XL) 300 MG 24 hr tablet Take 300 mg by mouth daily.  1  . clopidogrel (PLAVIX) 75 MG tablet Take 1 tablet (75 mg total) by mouth daily. 90 tablet 3  . cyanocobalamin (,VITAMIN B-12,) 1000 MCG/ML  injection Inject 1,000 mcg into the muscle every 30 (thirty) days. Vitamin B12   - last injection 05/22/17    . dexlansoprazole (DEXILANT) 60 MG capsule Take 60 mg by mouth daily as needed (acid reflux).    Marland Kitchen doxycycline (VIBRAMYCIN) 100 MG capsule Take 1 capsule (100 mg total) by mouth 2 (two) times daily. 14 capsule 0  . gabapentin (NEURONTIN) 300 MG capsule Take 300 mg by mouth at bedtime as needed (pain).    . hydrOXYzine (ATARAX/VISTARIL) 25 MG tablet Take 25 mg by mouth at bedtime as needed for anxiety or itching.    . meclizine (ANTIVERT) 25 MG tablet Take 25 mg by mouth 2 (two) times daily as needed for dizziness.    . Multiple Vitamin (MULTIVITAMIN WITH MINERALS) TABS tablet Take 1 tablet by mouth daily.    . Multiple Vitamins-Minerals (MACULAR HEALTH FORMULA) CAPS Take 1 capsule by mouth daily.    . pentosan polysulfate (ELMIRON) 100 MG capsule Take 200 mg by mouth 2 (two) times daily.    . polyvinyl alcohol (ARTIFICIAL TEARS) 1.4 % ophthalmic solution Place 1 drop into both eyes 2 (two) times daily as needed for dry eyes.    . Probiotic Product (PROBIOTIC PO) Take 1 capsule by mouth at bedtime. [employer] brand    . Red Yeast Rice Extract (RED YEAST RICE PO) Take 1 capsule by mouth at bedtime.    . traZODone (DESYREL) 50 MG tablet Take 100 mg by mouth at bedtime.   0   No current facility-administered medications for this visit.     Allergies as of 03/02/2018 - Review Complete 03/02/2018  Allergen Reaction Noted  . Oxycodone hcl Itching and Other (See Comments) 02/23/2008  . Penicillins Itching and Rash 08/25/2006  . Tramadol Itching and Other (See Comments) 12/11/2015  . Aspirin Nausea And Vomiting and Other (See Comments) 12/11/2015  . Macrobid [nitrofurantoin monohyd macro] Nausea And Vomiting 12/23/2013  . Sulfa antibiotics Nausea Only 12/28/2013    Family History  Problem Relation Age of Onset  . Cancer Paternal Grandfather        Esophageal  . Coronary artery  disease Mother   . Diabetes Mother   . Hypertension Mother   . Dementia Father   . Sudden death Brother        Suicide    Social History   Socioeconomic History  . Marital status: Married    Spouse name: Not on file  . Number of children: Not on file  . Years of education: Not on file  . Highest education level: Not on file  Occupational History  . Not on file  Social Needs  . Financial resource strain: Not on file  . Food insecurity:    Worry: Not on file    Inability: Not on file  . Transportation needs:  Medical: Not on file    Non-medical: Not on file  Tobacco Use  . Smoking status: Former Smoker    Packs/day: 0.50    Years: 50.00    Pack years: 25.00    Types: Cigarettes    Last attempt to quit: 06/01/2017    Years since quitting: 0.7  . Smokeless tobacco: Never Used  Substance and Sexual Activity  . Alcohol use: No    Alcohol/week: 0.0 oz  . Drug use: No  . Sexual activity: Not Currently    Birth control/protection: Post-menopausal  Lifestyle  . Physical activity:    Days per week: Not on file    Minutes per session: Not on file  . Stress: Not on file  Relationships  . Social connections:    Talks on phone: Not on file    Gets together: Not on file    Attends religious service: Not on file    Active member of club or organization: Not on file    Attends meetings of clubs or organizations: Not on file    Relationship status: Not on file  . Intimate partner violence:    Fear of current or ex partner: Not on file    Emotionally abused: Not on file    Physically abused: Not on file    Forced sexual activity: Not on file  Other Topics Concern  . Not on file  Social History Narrative   Is a homemaker   Married for 94 years    Has a daughter and son    Pets: Two dogs and a Neurosurgeon   Likes to garden ( flower beds).         Physical Exam: BP 116/70 (BP Location: Left Arm, Patient Position: Sitting, Cuff Size: Normal)   Pulse 76   Ht 5' 1.5" (1.562  m) Comment: height measured without shoes  Wt 167 lb (75.8 kg)   LMP  (LMP Unknown)   BMI 31.04 kg/m  Constitutional: generally well-appearing Psychiatric: alert and oriented x3 Abdomen: soft, nontender, nondistended, no obvious ascites, no peritoneal signs, normal bowel sounds No peripheral edema noted in lower extremities  Assessment and plan: 72 y.o. female with change in bowels, constipation  I recommended she try daily fiber supplement.  It has been 8 years since she had a colonoscopy and I recommend we repeat that for her at her soonest convenience.  She takes Plavix on a daily basis and that should be held for 5 days prior to the colonoscopy.  We will communicate with her cardiologist to make sure that he is okay with that recommendation.  Please see the "Patient Instructions" section for addition details about the plan.  Owens Loffler, MD [facility] 03/02/2018, 3:55 PM

## 2018-03-02 NOTE — Patient Instructions (Addendum)
You will be set up for a colonoscopy for change in bowels, constipation. Hold plavix for 5 days, we will check with your cardiologist to make sure that is safe. Please start taking citrucel (orange flavored) powder fiber supplement.  This may cause some bloating at first but that usually goes away. Begin with a small spoonful and work your way up to a large, heaping spoonful daily over a week.  Normal BMI (Body Mass Index- based on height and weight) is between 23 and 30. Your BMI today is Body mass index is 31.04 kg/m. Marland Kitchen Please consider follow up  regarding your BMI with your Primary Care Provider.

## 2018-03-02 NOTE — Telephone Encounter (Signed)
[facility] Pre-operative Risk Assessment     Request for surgical clearance:     Endoscopy Procedure  What type of surgery is being performed?     Colonoscopy  When is this surgery scheduled?     05/11/18  What type of clearance is required ?   Pharmacy  Are there any medications that need to be held prior to surgery and how long? Plavix ,5 days  Practice name and name of physician performing surgery?      [facility]  What is your office phone and fax number?      Phone- [phone]  [phone]  Anesthesia type (None, local, MAC, general) ?       MAC

## 2018-03-03 NOTE — Telephone Encounter (Signed)
Ok to hold Plavix 5 days pre colonoscopy-(see March 2019 note from Dr Tamala Julian). Resume ASAP post op.   Kerin Ransom PA-C 03/03/2018 2:14 PM

## 2018-03-03 NOTE — Telephone Encounter (Signed)
Spoke to patient. Informed her that she is clear to hold plavix for 5 days prior to her 05/11/18 colonoscopy. Patient voiced understanding.

## 2018-03-12 ENCOUNTER — Encounter: Payer: Self-pay | Admitting: Family Medicine

## 2018-03-12 ENCOUNTER — Ambulatory Visit: Payer: BLUE CROSS/BLUE SHIELD | Admitting: Family Medicine

## 2018-03-12 VITALS — BP 124/86 | HR 73 | Temp 97.9°F | Ht 61.5 in | Wt 168.4 lb

## 2018-03-12 DIAGNOSIS — M545 Low back pain, unspecified: Secondary | ICD-10-CM

## 2018-03-12 MED ORDER — BACLOFEN 10 MG PO TABS: 10.0000 mg | ORAL_TABLET | Freq: Every day | ORAL | 0 refills | Status: DC

## 2018-03-12 NOTE — Patient Instructions (Signed)

## 2018-03-12 NOTE — Progress Notes (Signed)
Subjective:     Patient ID: Bianca [name], female   DOB: 07-18-1946, 72 y.o.   MRN: [id number]  HPI Patient seen with left lower lumbar back pain. She states 2 days ago she tripped at home and fell backwards into a door. She has some bruising on left foot but no foot pain. She has some poorly localized diffuse pain left lower lumbar region and radiates up somewhat bilaterally into the thoracic area. She's also had some mild left hip pain but no pain with weightbearing. Denies any radiculitis symptoms. She is requesting refills of baclofen which she has taken in the past. She has had apparently 2 previous lumbar fusions.  Patient had CT lumbosacral spine back in April which showed L4-S1 posterior arthrodesis with no acute abnormalities. No urine or stool incontinence  Past Medical History:  Diagnosis Date  . Arthritis   . Blood transfusion without reported diagnosis   . CAD (coronary artery disease)    a. 1997 MI/PCI RCA;  b. 2007 MI/PCI of 100% RCA with Taxus DES x 3 placed, EF 55%;  c. 2010 Cath: stable anatomy;  d. 05/2012 NSTEMI/Cath/PCI: LM nl, LAD 60-34m, D1 20ost, LCX 59m, RCA 40-28m ISR, 60/95d (Treated w/ 2.75x33 Xience Xpedition DES), PDA 22m (Treated w/ PTCA), EF 60%.  . Carpal tunnel syndrome on both sides   . Chronic lower back pain   . Depression   . GERD (gastroesophageal reflux disease)   . Hyperlipidemia   . Myocardial infarction [facility]) 1997, 2007, 2013  . Numbness of foot    Left foot - back surgery 2009   Past Surgical History:  Procedure Laterality Date  . ANKLE SURGERY  Years ago   Left; "had to take out a floater"  . CARPAL TUNNEL RELEASE Left 10/12016  . CESAREAN SECTION  1990  . CORONARY ANGIOPLASTY WITH STENT PLACEMENT  1997; 2007, 2013   "2 + 2; + 1= total of 5  . CORONARY ARTERY BYPASS GRAFT N/A 05/26/2017   Procedure: CORONARY ARTERY BYPASS GRAFTING (CABG), ON PUMP, TIMES Three, using left internal mammary artery and right greater saphenous vein harvested  endoscopically;  Surgeon: [name], MD;  Location: [place];  Service: Open Heart Surgery;  Laterality: N/A;  LIMA to LAD, SVG to OM 1, SVG to PDA  . LEFT HEART CATH AND CORONARY ANGIOGRAPHY N/A 05/26/2017   Procedure: LEFT HEART CATH AND CORONARY ANGIOGRAPHY;  Surgeon: Belva Crome, MD;  Location: [place] CV LAB;  Service: Cardiovascular;  Laterality: N/A;  . LEFT HEART CATHETERIZATION WITH CORONARY ANGIOGRAM N/A 05/18/2012   Procedure: LEFT HEART CATHETERIZATION WITH CORONARY ANGIOGRAM;  Surgeon: Wellington Hampshire, MD;  Location: [place] CATH LAB;  Service: Cardiovascular;  Laterality: N/A;  . [place]; 2009  . TEE WITHOUT CARDIOVERSION N/A 05/26/2017   Procedure: TRANSESOPHAGEAL ECHOCARDIOGRAM (TEE);  Surgeon: [name], MD;  Location: [place];  Service: Open Heart Surgery;  Laterality: N/A;  . TOTAL HIP ARTHROPLASTY Right 05/24/2014   Procedure: RIGHT TOTAL HIP ARTHROPLASTY ANTERIOR APPROACH;  Surgeon: Gearlean Alf, MD;  [facility];  Service: Orthopedics;  Laterality: Right;    reports that she quit smoking about 9 months ago. Her smoking use included cigarettes. She has a 25.00 pack-year smoking history. She has never used smokeless tobacco. She reports that she does not drink alcohol or use drugs. family history includes Cancer in her paternal grandfather; Coronary artery disease in her mother; Dementia in her father; Diabetes in  her mother; Hypertension in her mother; Sudden death in her brother. Allergies  Allergen Reactions  . Oxycodone Hcl Itching and Other (See Comments)    Confusion, hallucinations  . Penicillins Itching and Rash    Has patient had a PCN reaction causing immediate rash, facial/tongue/throat swelling, SOB or lightheadedness with hypotension: yes rash that took a while to go away across the abdomen Has patient had a PCN reaction causing severe rash involving mucus membranes or skin necrosis: no Has patient had a PCN reaction  that required hospitalization: no Has patient had a PCN reaction occurring within the last 10 years: No If all of the above answers are "NO", then may proceed with Cephalosporin use.   . Tramadol Itching and Other (See Comments)    hallucinations  . Aspirin Nausea And Vomiting and Other (See Comments)    Burning in stomach- tolerates enteric coated  . Macrobid [Nitrofurantoin Monohyd Macro] Nausea And Vomiting  . Sulfa Antibiotics Nausea Only     Review of Systems  Constitutional: Negative for chills and fever.  Genitourinary: Negative for dysuria.  Musculoskeletal: Positive for back pain.  Neurological: Negative for weakness and numbness.       Objective:   Physical Exam  Constitutional: She appears well-developed and well-nourished.  Cardiovascular: Normal rate and regular rhythm.  Pulmonary/Chest: Effort normal and breath sounds normal.  Musculoskeletal:  Straight leg raises are negative bilaterally. No visible bruising lower back region  Neurological:  Strength full lower extremities. Symmetric reflexes.       Assessment:     Left lower lumbar back pain following fall. Low clinical suspicion for any kind of fracture or acute bony injury other than contusion    Plan:     -Refill baclofen for nighttime use as needed -Continue walking as tolerated -Follow-up with primary for any persistent or worsening pain  Eulas Post MD [facility] at [facility]

## 2018-03-19 ENCOUNTER — Ambulatory Visit: Payer: BLUE CROSS/BLUE SHIELD | Admitting: Family Medicine

## 2018-03-19 ENCOUNTER — Encounter: Payer: Self-pay | Admitting: Family Medicine

## 2018-03-19 VITALS — BP 110/70 | HR 82 | Temp 98.1°F | Wt 165.2 lb

## 2018-03-19 DIAGNOSIS — R300 Dysuria: Secondary | ICD-10-CM | POA: Diagnosis not present

## 2018-03-19 DIAGNOSIS — R1030 Lower abdominal pain, unspecified: Secondary | ICD-10-CM | POA: Diagnosis not present

## 2018-03-19 LAB — POCT URINALYSIS DIPSTICK
Bilirubin, UA: NEGATIVE
GLUCOSE UA: NEGATIVE
Ketones, UA: NEGATIVE
Nitrite, UA: NEGATIVE
PH UA: 5 (ref 5.0–8.0)
PROTEIN UA: NEGATIVE
Spec Grav, UA: 1.015 (ref 1.010–1.025)
Urobilinogen, UA: 0.2 E.U./dL

## 2018-03-19 MED ORDER — CEPHALEXIN 500 MG PO CAPS: 500.0000 mg | ORAL_CAPSULE | Freq: Three times a day (TID) | ORAL | 0 refills | Status: DC

## 2018-03-19 NOTE — Patient Instructions (Signed)
Stay hydrated  Go ahead and start the Keflex  We will call you with the culture result by Monday.

## 2018-03-19 NOTE — Progress Notes (Signed)
Subjective:     Patient ID: Bianca [name], female   DOB: 1945-11-03, 72 y.o.   MRN: [id number]  HPI Patient seen with 2 day history of some mild suprapubic discomfort. She states she has some chronic mild burning with urination which is really unchanged. Possibly some mild frequency. No flank pain. No fevers or chills. No nausea or vomiting. No gross hematuria. Reported allergies to sulfa, Macrobid, penicillin.  No vaginal discharge.   Past Medical History:  Diagnosis Date  . Arthritis   . Blood transfusion without reported diagnosis   . CAD (coronary artery disease)    a. 1997 MI/PCI RCA;  b. 2007 MI/PCI of 100% RCA with Taxus DES x 3 placed, EF 55%;  c. 2010 Cath: stable anatomy;  d. 05/2012 NSTEMI/Cath/PCI: LM nl, LAD 60-31m, D1 20ost, LCX 46m, RCA 40-14m ISR, 60/95d (Treated w/ 2.75x33 Xience Xpedition DES), PDA 19m (Treated w/ PTCA), EF 60%.  . Carpal tunnel syndrome on both sides   . Chronic lower back pain   . Depression   . GERD (gastroesophageal reflux disease)   . Hyperlipidemia   . Myocardial infarction [facility]) 1997, 2007, 2013  . Numbness of foot    Left foot - back surgery 2009   Past Surgical History:  Procedure Laterality Date  . ANKLE SURGERY  Years ago   Left; "had to take out a floater"  . CARPAL TUNNEL RELEASE Left 10/12016  . CESAREAN SECTION  1990  . CORONARY ANGIOPLASTY WITH STENT PLACEMENT  1997; 2007, 2013   "2 + 2; + 1= total of 5  . CORONARY ARTERY BYPASS GRAFT N/A 05/26/2017   Procedure: CORONARY ARTERY BYPASS GRAFTING (CABG), ON PUMP, TIMES Three, using left internal mammary artery and right greater saphenous vein harvested endoscopically;  Surgeon: [name], MD;  Location: [place];  Service: Open Heart Surgery;  Laterality: N/A;  LIMA to LAD, SVG to OM 1, SVG to PDA  . LEFT HEART CATH AND CORONARY ANGIOGRAPHY N/A 05/26/2017   Procedure: LEFT HEART CATH AND CORONARY ANGIOGRAPHY;  Surgeon: Belva Crome, MD;  Location: [place] CV LAB;  Service:  Cardiovascular;  Laterality: N/A;  . LEFT HEART CATHETERIZATION WITH CORONARY ANGIOGRAM N/A 05/18/2012   Procedure: LEFT HEART CATHETERIZATION WITH CORONARY ANGIOGRAM;  Surgeon: Wellington Hampshire, MD;  Location: [place] CATH LAB;  Service: Cardiovascular;  Laterality: N/A;  . [place]; 2009  . TEE WITHOUT CARDIOVERSION N/A 05/26/2017   Procedure: TRANSESOPHAGEAL ECHOCARDIOGRAM (TEE);  Surgeon: [name], MD;  Location: [place];  Service: Open Heart Surgery;  Laterality: N/A;  . TOTAL HIP ARTHROPLASTY Right 05/24/2014   Procedure: RIGHT TOTAL HIP ARTHROPLASTY ANTERIOR APPROACH;  Surgeon: Gearlean Alf, MD;  [facility];  Service: Orthopedics;  Laterality: Right;    reports that she quit smoking about 9 months ago. Her smoking use included cigarettes. She has a 25.00 pack-year smoking history. She has never used smokeless tobacco. She reports that she does not drink alcohol or use drugs. family history includes Cancer in her paternal grandfather; Coronary artery disease in her mother; Dementia in her father; Diabetes in her mother; Hypertension in her mother; Sudden death in her brother. Allergies  Allergen Reactions  . Oxycodone Hcl Itching and Other (See Comments)    Confusion, hallucinations  . Penicillins Itching and Rash    Has patient had a PCN reaction causing immediate rash, facial/tongue/throat swelling, SOB or lightheadedness with hypotension: yes rash that took a while to  go away across the abdomen Has patient had a PCN reaction causing severe rash involving mucus membranes or skin necrosis: no Has patient had a PCN reaction that required hospitalization: no Has patient had a PCN reaction occurring within the last 10 years: No If all of the above answers are "NO", then may proceed with Cephalosporin use.   . Tramadol Itching and Other (See Comments)    hallucinations  . Aspirin Nausea And Vomiting and Other (See Comments)    Burning in stomach-  tolerates enteric coated  . Macrobid [Nitrofurantoin Monohyd Macro] Nausea And Vomiting  . Sulfa Antibiotics Nausea Only     Review of Systems  Constitutional: Negative for chills and fever.  Gastrointestinal: Negative for nausea and vomiting.  Genitourinary: Positive for dysuria. Negative for hematuria.       Objective:   Physical Exam  Constitutional: She appears well-developed and well-nourished.  Abdominal: Normal appearance and bowel sounds are normal.       Assessment:     Dysuria. Urine dipstick reveals only trace leukocytes and blood. She describes some chronic dysuria but new symptom is some mild suprapubic discomfort. Nonacute exam.  Patient states she always has trace blood on her urine dipstick and has had full urologic workup previously    Plan:     -Urine culture sent -Cover with Keflex 500 mg 3 times a day for 5 days pending culture results  Eulas Post MD [place]

## 2018-03-20 LAB — URINE CULTURE
MICRO NUMBER:: 90916358
SPECIMEN QUALITY:: ADEQUATE

## 2018-04-08 ENCOUNTER — Other Ambulatory Visit: Payer: Self-pay | Admitting: Family Medicine

## 2018-04-09 DIAGNOSIS — M48062 Spinal stenosis, lumbar region with neurogenic claudication: Secondary | ICD-10-CM | POA: Diagnosis not present

## 2018-04-09 DIAGNOSIS — M5136 Other intervertebral disc degeneration, lumbar region: Secondary | ICD-10-CM | POA: Diagnosis not present

## 2018-04-09 DIAGNOSIS — Z981 Arthrodesis status: Secondary | ICD-10-CM | POA: Diagnosis not present

## 2018-04-09 DIAGNOSIS — M47816 Spondylosis without myelopathy or radiculopathy, lumbar region: Secondary | ICD-10-CM | POA: Diagnosis not present

## 2018-04-23 DIAGNOSIS — G894 Chronic pain syndrome: Secondary | ICD-10-CM | POA: Diagnosis not present

## 2018-04-27 DIAGNOSIS — F332 Major depressive disorder, recurrent severe without psychotic features: Secondary | ICD-10-CM | POA: Diagnosis not present

## 2018-04-28 ENCOUNTER — Encounter: Payer: Self-pay | Admitting: Gastroenterology

## 2018-05-10 ENCOUNTER — Telehealth: Payer: Self-pay | Admitting: Gastroenterology

## 2018-05-10 NOTE — Telephone Encounter (Signed)
[place], thanks.  No charge.  Will have to hold plavix again if/when she reschedules.

## 2018-05-11 ENCOUNTER — Encounter: Payer: BLUE CROSS/BLUE SHIELD | Admitting: Gastroenterology

## 2018-05-13 DIAGNOSIS — M545 Low back pain: Secondary | ICD-10-CM | POA: Diagnosis not present

## 2018-05-25 DIAGNOSIS — F332 Major depressive disorder, recurrent severe without psychotic features: Secondary | ICD-10-CM | POA: Diagnosis not present

## 2018-05-26 DIAGNOSIS — M48061 Spinal stenosis, lumbar region without neurogenic claudication: Secondary | ICD-10-CM | POA: Diagnosis not present

## 2018-05-26 DIAGNOSIS — M961 Postlaminectomy syndrome, not elsewhere classified: Secondary | ICD-10-CM | POA: Diagnosis not present

## 2018-06-09 ENCOUNTER — Ambulatory Visit: Payer: BLUE CROSS/BLUE SHIELD | Admitting: Adult Health

## 2018-06-09 ENCOUNTER — Encounter: Payer: Self-pay | Admitting: Adult Health

## 2018-06-09 VITALS — BP 118/60 | Temp 97.7°F | Wt 169.0 lb

## 2018-06-09 DIAGNOSIS — Z760 Encounter for issue of repeat prescription: Secondary | ICD-10-CM | POA: Diagnosis not present

## 2018-06-09 DIAGNOSIS — J0110 Acute frontal sinusitis, unspecified: Secondary | ICD-10-CM

## 2018-06-09 MED ORDER — ALPRAZOLAM 0.25 MG PO TABS: 0.2500 mg | ORAL_TABLET | Freq: Two times a day (BID) | ORAL | 0 refills | Status: DC | PRN

## 2018-06-09 MED ORDER — BACLOFEN 10 MG PO TABS: ORAL_TABLET | ORAL | 0 refills | Status: DC

## 2018-06-09 MED ORDER — DOXYCYCLINE HYCLATE 100 MG PO CAPS: 100.0000 mg | ORAL_CAPSULE | Freq: Two times a day (BID) | ORAL | 0 refills | Status: DC

## 2018-06-09 NOTE — Patient Instructions (Signed)
It was great seeing you today   I have sent in an antibiotic for your sinus infection   Please follow up for your physical, it has been awhile

## 2018-06-09 NOTE — Progress Notes (Signed)
   Subjective:    Patient ID: Bianca [name], female    DOB: 12/18/1945, 72 y.o.   MRN: [id number]  URI   This is a new problem. The current episode started 1 to 4 weeks ago. The problem has been gradually worsening. There has been no fever. Associated symptoms include congestion, coughing (semi productive ), headaches, sinus pain and a sore throat. Pertinent negatives include no diarrhea, ear pain, nausea, plugged ear sensation, rhinorrhea or wheezing. Treatments tried: Mucinex. The treatment provided mild relief.   She also needs refills of Baclofen and Xanax    Review of Systems  Constitutional: Positive for activity change, appetite change, chills and fatigue. Negative for diaphoresis and fever.  HENT: Positive for congestion, postnasal drip, sinus pressure, sinus pain and sore throat. Negative for ear pain, rhinorrhea and trouble swallowing.   Respiratory: Positive for cough (semi productive ). Negative for wheezing.   Cardiovascular: Negative.   Gastrointestinal: Negative for diarrhea and nausea.  Neurological: Positive for headaches.       Objective:   Physical Exam  Constitutional: She appears well-developed and well-nourished. No distress.  HENT:  Nose: Mucosal edema present. Right sinus exhibits frontal sinus tenderness.  Mouth/Throat: Uvula is midline, oropharynx is clear and moist and mucous membranes are normal.  + PND   Eyes: Pupils are equal, round, and reactive to light. Conjunctivae are normal. Right eye exhibits no discharge. Left eye exhibits no discharge. No scleral icterus.  Neck: Normal range of motion. Neck supple.  Cardiovascular: Normal rate, regular rhythm, normal heart sounds and intact distal pulses.  Pulmonary/Chest: Effort normal and breath sounds normal.  Lymphadenopathy:       Head (right side): Submandibular and tonsillar adenopathy present.       Head (left side): Submandibular and tonsillar adenopathy present.  Skin: She is not diaphoretic.  Nursing  note and vitals reviewed.      Assessment & Plan:  1. Acute non-recurrent frontal sinusitis  - doxycycline (VIBRAMYCIN) 100 MG capsule; Take 1 capsule (100 mg total) by mouth 2 (two) times daily.  Dispense: 14 capsule; Refill: 0 - Follow up in 2-3 days if no improvement  - Add flonase  - Rest and stay hydrated   2. Medication refill  - ALPRAZolam (XANAX) 0.25 MG tablet; Take 1 tablet (0.25 mg total) by mouth 2 (two) times daily as needed for up to 30 doses for anxiety.  Dispense: 30 tablet; Refill: 0 - baclofen (LIORESAL) 10 MG tablet; TAKE 1 TABLET BY MOUTH EVERYDAY AT BEDTIME  Dispense: 15 tablet; Refill: 0   [name], NP

## 2018-06-20 DIAGNOSIS — R062 Wheezing: Secondary | ICD-10-CM | POA: Diagnosis not present

## 2018-06-24 ENCOUNTER — Ambulatory Visit: Payer: Self-pay

## 2018-06-24 NOTE — Telephone Encounter (Signed)
Pt called with C/O chest tightness and cough since October 23rd. She states that she was place on antibiotic.  She was still having congested cough this weekend and was seen at urgent care.  She states that they gave her a steroid and cough medication and told her that if she wasn't well she needed to see her PCP and probably would need a CXR. Pt describes chest tightness as mild and denies chest pain. She say the cough is tight. It is sometimes a little productive. 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 She states she is mildly SOB at times and dizzy at times. She denies fever but has had chills at the beginning of her symptoms. Per protocol pt was to go to the ED but she requested that I ask if the office would see her.  Call place to [facility]. Appointment tomorrow with Dr Elease Hashimoto OK'd.  Care advice read to patient. Pt verbalized understanding of all instructions.   Reason for Disposition . Taking a deep breath makes pain worse  Answer Assessment - Initial Assessment Questions 1. LOCATION: "Where does it hurt?"       Tightness center of chest 2. RADIATION: "Does the pain go anywhere else?" (e.g., into neck, jaw, arms, back)     none 3. ONSET: "When did the chest pain begin?" (Minutes, hours or days)      1 week ago 23 october 4. PATTERN "Does the pain come and go, or has it been constant since it started?"  "Does it get worse with exertion?"      Just when Im coughing 5. DURATION: "How long does it last" (e.g., seconds, minutes, hours)     seconds 6. SEVERITY: "How bad is the pain?"  (e.g., Scale 1-10; mild, moderate, or severe)    - MILD (1-3): doesn't interfere with normal activities     - MODERATE (4-7): interferes with normal activities or awakens from sleep    - SEVERE (8-10): excruciating pain, unable to do any normal activities       3 7. CARDIAC RISK FACTORS: "Do you have any history of heart problems or risk factors for heart disease?" (e.g., prior heart attack, angina;  high blood pressure, diabetes, being overweight, high cholesterol, smoking, or strong family history of heart disease)     By pass heart attack x 3 8. PULMONARY RISK FACTORS: "Do you have any history of lung disease?"  (e.g., blood clots in lung, asthma, emphysema, birth control pills)     no 9. CAUSE: "What do you think is causing the chest pain?"     Congestion in my chest 10. OTHER SYMPTOMS: "Do you have any other symptoms?" (e.g., dizziness, nausea, vomiting, sweating, fever, difficulty breathing, cough)       Cough, chills but better now,left foot a little swollen but is normal for her 11. PREGNANCY: "Is there any chance you are pregnant?" "When was your last menstrual period?"       No  Protocols used: CHEST PAIN-A-AH

## 2018-06-24 NOTE — Telephone Encounter (Signed)
FYI

## 2018-06-25 ENCOUNTER — Encounter: Payer: Self-pay | Admitting: Adult Health

## 2018-06-25 ENCOUNTER — Ambulatory Visit: Payer: BLUE CROSS/BLUE SHIELD | Admitting: Adult Health

## 2018-06-25 ENCOUNTER — Ambulatory Visit: Payer: BLUE CROSS/BLUE SHIELD | Admitting: Family Medicine

## 2018-06-25 VITALS — BP 102/62 | HR 70 | Temp 97.8°F | Wt 167.0 lb

## 2018-06-25 DIAGNOSIS — J069 Acute upper respiratory infection, unspecified: Secondary | ICD-10-CM

## 2018-06-25 MED ORDER — PREDNISONE 10 MG PO TABS: ORAL_TABLET | ORAL | 0 refills | Status: DC

## 2018-06-25 MED ORDER — LEVOFLOXACIN 750 MG PO TABS: 750.0000 mg | ORAL_TABLET | Freq: Every day | ORAL | 0 refills | Status: DC

## 2018-06-25 NOTE — Telephone Encounter (Signed)
Pt has been scheduled - coming it to see CN today at 3pm Nothing further needed.

## 2018-06-25 NOTE — Progress Notes (Signed)
Subjective:    Patient ID: Bianca [name], female    DOB: 1945-08-24, 72 y.o.   MRN: [id number]  HPI 72 year old female who  has a past medical history of Arthritis, Blood transfusion without reported diagnosis, CAD (coronary artery disease), Carpal tunnel syndrome on both sides, Chronic lower back pain, Depression, GERD (gastroesophageal reflux disease), Hyperlipidemia, Myocardial infarction ([place]) (1997, 2007, 2013), and Numbness of foot.  She presents to the office today for the complaint of chest tightness and cough. Her symptoms have been present since October 23rd, when she was seen by this writer and prescribed a course of Doxycycline for suspected URI. After she finished the course of antibiotics she was still feeling congested and went to urgent care where she was prescribed a medrol dose pack and cough medication. She reports that she continues to have chest congestion, nasal congestion, productive cough ( with yellow/clear) sputum, shortness of breath and fatigue.   She denies fevers, nausea, vomiting, or diarrhea.    Review of Systems See HPI   Past Medical History:  Diagnosis Date  . Arthritis   . Blood transfusion without reported diagnosis   . CAD (coronary artery disease)    a. 1997 MI/PCI RCA;  b. 2007 MI/PCI of 100% RCA with Taxus DES x 3 placed, EF 55%;  c. 2010 Cath: stable anatomy;  d. 05/2012 NSTEMI/Cath/PCI: LM nl, LAD 60-62m, D1 20ost, LCX 97m, RCA 40-67m ISR, 60/95d (Treated w/ 2.75x33 Xience Xpedition DES), PDA 101m (Treated w/ PTCA), EF 60%.  . Carpal tunnel syndrome on both sides   . Chronic lower back pain   . Depression   . GERD (gastroesophageal reflux disease)   . Hyperlipidemia   . Myocardial infarction [facility]) 1997, 2007, 2013  . Numbness of foot    Left foot - back surgery 2009    Social History   Socioeconomic History  . Marital status: Married    Spouse name: Not on file  . Number of children: Not on file  . Years of education: Not on file  .  Highest education level: Not on file  Occupational History  . Not on file  Social Needs  . Financial resource strain: Not on file  . Food insecurity:    Worry: Not on file    Inability: Not on file  . Transportation needs:    Medical: Not on file    Non-medical: Not on file  Tobacco Use  . Smoking status: Former Smoker    Packs/day: 0.50    Years: 50.00    Pack years: 25.00    Types: Cigarettes    Last attempt to quit: 06/01/2017    Years since quitting: 1.0  . Smokeless tobacco: Never Used  Substance and Sexual Activity  . Alcohol use: No    Alcohol/week: 0.0 standard drinks  . Drug use: No  . Sexual activity: Not Currently    Birth control/protection: Post-menopausal  Lifestyle  . Physical activity:    Days per week: Not on file    Minutes per session: Not on file  . Stress: Not on file  Relationships  . Social connections:    Talks on phone: Not on file    Gets together: Not on file    Attends religious service: Not on file    Active member of club or organization: Not on file    Attends meetings of clubs or organizations: Not on file    Relationship status: Not on file  . Intimate partner violence:  Fear of current or ex partner: Not on file    Emotionally abused: Not on file    Physically abused: Not on file    Forced sexual activity: Not on file  Other Topics Concern  . Not on file  Social History Narrative   Is a homemaker   Married for 65 years    Has a daughter and son    Pets: Two dogs and a Neurosurgeon   Likes to garden ( flower beds).        Past Surgical History:  Procedure Laterality Date  . ANKLE SURGERY  Years ago   Left; "had to take out a floater"  . CARPAL TUNNEL RELEASE Left 10/12016  . CESAREAN SECTION  1990  . CORONARY ANGIOPLASTY WITH STENT PLACEMENT  1997; 2007, 2013   "2 + 2; + 1= total of 5  . CORONARY ARTERY BYPASS GRAFT N/A 05/26/2017   Procedure: CORONARY ARTERY BYPASS GRAFTING (CABG), ON PUMP, TIMES Three, using left internal  mammary artery and right greater saphenous vein harvested endoscopically;  Surgeon: [name], MD;  Location: [facility];  Service: Open Heart Surgery;  Laterality: N/A;  LIMA to LAD, SVG to OM 1, SVG to PDA  . LEFT HEART CATH AND CORONARY ANGIOGRAPHY N/A 05/26/2017   Procedure: LEFT HEART CATH AND CORONARY ANGIOGRAPHY;  Surgeon: Belva Crome, MD;  Location: [facility];  Service: Cardiovascular;  Laterality: N/A;  . LEFT HEART CATHETERIZATION WITH CORONARY ANGIOGRAM N/A 05/18/2012   Procedure: LEFT HEART CATHETERIZATION WITH CORONARY ANGIOGRAM;  Surgeon: Wellington Hampshire, MD;  Location: [facility];  Service: Cardiovascular;  Laterality: N/A;  . [place]; 2009  . TEE WITHOUT CARDIOVERSION N/A 05/26/2017   Procedure: TRANSESOPHAGEAL ECHOCARDIOGRAM (TEE);  Surgeon: [name], MD;  Location: [facility];  Service: Open Heart Surgery;  Laterality: N/A;  . TOTAL HIP ARTHROPLASTY Right 05/24/2014   Procedure: RIGHT TOTAL HIP ARTHROPLASTY ANTERIOR APPROACH;  Surgeon: Gearlean Alf, MD;  Location: [facility];  Service: Orthopedics;  Laterality: Right;    Family History  Problem Relation Age of Onset  . Cancer Paternal Grandfather        Esophageal  . Coronary artery disease Mother   . Diabetes Mother   . Hypertension Mother   . Dementia Father   . Sudden death Brother        Suicide    Allergies  Allergen Reactions  . Oxycodone Hcl Itching and Other (See Comments)    Confusion, hallucinations  . Penicillins Itching and Rash    Has patient had a PCN reaction causing immediate rash, facial/tongue/throat swelling, SOB or lightheadedness with hypotension: yes rash that took a while to go away across the abdomen Has patient had a PCN reaction causing severe rash involving mucus membranes or skin necrosis: no Has patient had a PCN reaction that required hospitalization: no Has patient had a PCN reaction occurring within the last 10 years: No If all of the  above answers are "NO", then may proceed with Cephalosporin use.   . Tramadol Itching and Other (See Comments)    hallucinations  . Nitrofurantoin Macrocrystal   . Aspirin Nausea And Vomiting and Other (See Comments)    Burning in stomach- tolerates enteric coated  . Macrobid [Nitrofurantoin Monohyd Macro] Nausea And Vomiting  . Sulfa Antibiotics Nausea Only    Current Outpatient Medications on File Prior to Visit  Medication Sig Dispense Refill  . acetaminophen (TYLENOL) 325 MG tablet  Take 2 tablets (650 mg total) by mouth every 6 (six) hours as needed for fever, headache, mild pain or moderate pain.    Marland Kitchen ALPRAZolam (XANAX) 0.25 MG tablet Take 1 tablet (0.25 mg total) by mouth 2 (two) times daily as needed for up to 30 doses for anxiety. 30 tablet 0  . atorvastatin (LIPITOR) 80 MG tablet TAKE 1 TABLET BY MOUTH EVERY DAY AT 6PM 90 tablet 3  . baclofen (LIORESAL) 10 MG tablet TAKE 1 TABLET BY MOUTH EVERYDAY AT BEDTIME 15 tablet 0  . benzonatate (TESSALON) 200 MG capsule   0  . clopidogrel (PLAVIX) 75 MG tablet Take 1 tablet (75 mg total) by mouth daily. 90 tablet 3  . cyanocobalamin (,VITAMIN B-12,) 1000 MCG/ML injection Inject 1,000 mcg into the muscle every 30 (thirty) days. Vitamin B12   - last injection 05/22/17    . doxycycline (VIBRAMYCIN) 100 MG capsule Take 1 capsule (100 mg total) by mouth 2 (two) times daily. 14 capsule 0  . FLUoxetine (PROZAC) 20 MG capsule Take 60 mg by mouth daily.   1  . hydrOXYzine (ATARAX/VISTARIL) 25 MG tablet Take 25 mg by mouth at bedtime as needed for anxiety or itching.    . meclizine (ANTIVERT) 25 MG tablet Take 25 mg by mouth 2 (two) times daily as needed for dizziness.    . methylPREDNISolone (MEDROL DOSEPAK) 4 MG TBPK tablet   0  . Multiple Vitamin (MULTIVITAMIN WITH MINERALS) TABS tablet Take 1 tablet by mouth daily.    . Multiple Vitamins-Minerals (MACULAR HEALTH FORMULA) CAPS Take 1 capsule by mouth daily.    . ondansetron (ZOFRAN) 4 MG tablet  Take 4 mg by mouth every 12 (twelve) hours as needed.  1  . pentosan polysulfate (ELMIRON) 100 MG capsule Take 200 mg by mouth 2 (two) times daily.    . polyethylene glycol-electrolytes (NULYTELY/GOLYTELY) 420 g solution Take 4,000 mLs by mouth as directed. 4000 mL 0  . polyvinyl alcohol (ARTIFICIAL TEARS) 1.4 % ophthalmic solution Place 1 drop into both eyes 2 (two) times daily as needed for dry eyes.    . Probiotic Product (PROBIOTIC PO) Take 1 capsule by mouth at bedtime. [employer]'s brand    . Red Yeast Rice Extract (RED YEAST RICE PO) Take 1 capsule by mouth at bedtime.    . traZODone (DESYREL) 100 MG tablet Take 150 mg by mouth at bedtime.     . VENTOLIN HFA 108 (90 Base) MCG/ACT inhaler   0   No current facility-administered medications on file prior to visit.     BP 102/62 (BP Location: Left Arm, Patient Position: Sitting, Cuff Size: Normal)   Pulse 70   Temp 97.8 F (36.6 C) (Oral)   Wt 167 lb (75.8 kg)   LMP  (LMP Unknown)   SpO2 97%   BMI 31.04 kg/m       Objective:   Physical Exam  Constitutional: She is oriented to person, place, and time. She appears well-developed and well-nourished. No distress.  HENT:  Right Ear: Hearing, tympanic membrane, external ear and ear canal normal.  Left Ear: Hearing, tympanic membrane, external ear and ear canal normal.  Nose: Mucosal edema and rhinorrhea present. No nasal deformity.  Mouth/Throat: Uvula is midline, oropharynx is clear and moist and mucous membranes are normal.  Cardiovascular: Normal rate, regular rhythm, normal heart sounds and intact distal pulses.  Pulmonary/Chest: Effort normal. She has wheezes in the right upper field, the right lower field, the left upper field  and the left lower field. She has rhonchi in the right lower field and the left lower field. She has no rales.  Neurological: She is alert and oriented to person, place, and time.  Skin: She is not diaphoretic.  Nursing note and vitals reviewed.       Assessment & Plan:  Consider abx failure. Unable to get chest xray due to machine being unavailable until next week. She does not want to travel to another facility for chest xray. There is concern for pneumonia. Will treat with Levaquin and Prednisone taper.   Follow up next week if not improving over the weekend    [name], NP

## 2018-06-29 ENCOUNTER — Emergency Department (HOSPITAL_COMMUNITY)
Admission: EM | Admit: 2018-06-29 | Discharge: 2018-06-29 | Disposition: A | Discharge status: Home / Self Care | Payer: BLUE CROSS/BLUE SHIELD | Attending: Emergency Medicine | Admitting: Emergency Medicine

## 2018-06-29 ENCOUNTER — Emergency Department (HOSPITAL_COMMUNITY): Payer: BLUE CROSS/BLUE SHIELD

## 2018-06-29 ENCOUNTER — Encounter (HOSPITAL_COMMUNITY): Payer: Self-pay | Admitting: Emergency Medicine

## 2018-06-29 ENCOUNTER — Other Ambulatory Visit: Payer: Self-pay

## 2018-06-29 DIAGNOSIS — Z7902 Long term (current) use of antithrombotics/antiplatelets: Secondary | ICD-10-CM | POA: Diagnosis not present

## 2018-06-29 DIAGNOSIS — Z951 Presence of aortocoronary bypass graft: Secondary | ICD-10-CM | POA: Diagnosis not present

## 2018-06-29 DIAGNOSIS — Z96641 Presence of right artificial hip joint: Secondary | ICD-10-CM | POA: Insufficient documentation

## 2018-06-29 DIAGNOSIS — Z87891 Personal history of nicotine dependence: Secondary | ICD-10-CM | POA: Insufficient documentation

## 2018-06-29 DIAGNOSIS — R05 Cough: Secondary | ICD-10-CM | POA: Diagnosis not present

## 2018-06-29 DIAGNOSIS — I252 Old myocardial infarction: Secondary | ICD-10-CM | POA: Insufficient documentation

## 2018-06-29 DIAGNOSIS — R0789 Other chest pain: Secondary | ICD-10-CM | POA: Diagnosis not present

## 2018-06-29 DIAGNOSIS — I2510 Atherosclerotic heart disease of native coronary artery without angina pectoris: Secondary | ICD-10-CM | POA: Insufficient documentation

## 2018-06-29 DIAGNOSIS — R0981 Nasal congestion: Secondary | ICD-10-CM | POA: Diagnosis not present

## 2018-06-29 DIAGNOSIS — Z79899 Other long term (current) drug therapy: Secondary | ICD-10-CM | POA: Insufficient documentation

## 2018-06-29 DIAGNOSIS — R42 Dizziness and giddiness: Secondary | ICD-10-CM | POA: Diagnosis not present

## 2018-06-29 DIAGNOSIS — F329 Major depressive disorder, single episode, unspecified: Secondary | ICD-10-CM | POA: Diagnosis not present

## 2018-06-29 DIAGNOSIS — R059 Cough, unspecified: Secondary | ICD-10-CM

## 2018-06-29 LAB — BASIC METABOLIC PANEL
Anion gap: 10 (ref 5–15)
BUN: 12 mg/dL (ref 8–23)
CO2: 23 mmol/L (ref 22–32)
CREATININE: 1.03 mg/dL — AB (ref 0.44–1.00)
Calcium: 9 mg/dL (ref 8.9–10.3)
Chloride: 99 mmol/L (ref 98–111)
GFR calc Af Amer: >60 mL/min (ref >60)
GFR calc non Af Amer: 53 mL/min — ABNORMAL LOW (ref >60)
GLUCOSE: 132 mg/dL — AB (ref 70–99)
POTASSIUM: 3.9 mmol/L (ref 3.5–5.1)
Sodium: 132 mmol/L — ABNORMAL LOW (ref 135–145)

## 2018-06-29 LAB — CBC
HEMATOCRIT: 41.3 % (ref 36.0–46.0)
Hemoglobin: 13.5 g/dL (ref 12.0–15.0)
MCH: 30.3 pg (ref 26.0–34.0)
MCHC: 32.7 g/dL (ref 30.0–36.0)
MCV: 92.6 fL (ref 80.0–100.0)
Platelets: 187 10*3/uL (ref 150–400)
RBC: 4.46 MIL/uL (ref 3.87–5.11)
RDW: 12 % (ref 11.5–15.5)
WBC: 8.3 10*3/uL (ref 4.0–10.5)
nRBC: 0 % (ref 0.0–0.2)

## 2018-06-29 LAB — POCT I-STAT TROPONIN I: Troponin i, poc: 0 ng/mL (ref 0.00–0.08)

## 2018-06-29 MED ORDER — ALBUTEROL SULFATE HFA 108 (90 BASE) MCG/ACT IN AERS: 2.0000 | INHALATION_SPRAY | RESPIRATORY_TRACT | 0 refills | Status: DC | PRN

## 2018-06-29 NOTE — ED Triage Notes (Signed)
Patent c/o cough x2 weeks with chest tightness since last night. Reports taking prednisone and levofloxacin with no relief.

## 2018-06-29 NOTE — ED Notes (Signed)
Blue top in LAB

## 2018-06-29 NOTE — Discharge Instructions (Addendum)
Follow-up with your primary care provider on this matter. May use the albuterol inhaler, as needed, for shortness of breath. Return to the ED as needed.

## 2018-06-29 NOTE — ED Provider Notes (Signed)
[facility] [facility] DEPT Provider Note   CSN: [id number] Arrival date & time: 06/29/18  1614     History   Chief Complaint Chief Complaint  Patient presents with  . Cough  . Chest Pain    HPI Bianca [name] is a 72 y.o. female.  HPI   Bianca [name] is a 72 y.o. female, with a history of CAD, CABG, GERD, MI, hyperlipidemia, presenting to the ED with cough for at least the last 2 weeks.  Also notes upper respiratory congestion.  She complains of chest pressure with coughing and occasional lightheadedness with coughing.  She came in today because she had a coughing fit that made her lightheaded.  Patient is still smoking. She was evaluated by PCP on October 23, diagnosed with sinusitis, and prescribed 7 days of doxycycline.  She was seen again on November 8 and treated with levofloxacin 750 mg daily for 5 days.  She was also prescribed a 9-day prednisone taper.  Denies fever/chills, N/V/D, syncope, dizziness, shortness of breath, or any other complaints.    Past Medical History:  Diagnosis Date  . Arthritis   . Blood transfusion without reported diagnosis   . CAD (coronary artery disease)    a. 1997 MI/PCI RCA;  b. 2007 MI/PCI of 100% RCA with Taxus DES x 3 placed, EF 55%;  c. 2010 Cath: stable anatomy;  d. 05/2012 NSTEMI/Cath/PCI: LM nl, LAD 60-103m, D1 20ost, LCX 80m, RCA 40-2m ISR, 60/95d (Treated w/ 2.75x33 Xience Xpedition DES), PDA 60m (Treated w/ PTCA), EF 60%.  . Carpal tunnel syndrome on both sides   . Chronic lower back pain   . Depression   . GERD (gastroesophageal reflux disease)   . Hyperlipidemia   . Myocardial infarction [facility]) 1997, 2007, 2013  . Numbness of foot    Left foot - back surgery 2009    Patient Active Problem List   Diagnosis Date Noted  . Coronary artery disease involving coronary bypass graft of native heart with angina pectoris ([place]) 08/28/2017  . Postoperative atrial fibrillation ([place]) 08/28/2017  . RAD (reactive  airway disease) 04/01/2016  . B12 deficiency 07/11/2015  . Anemia associated with acute blood loss 05/28/2014  . Cigarette smoker 05/28/2014  . OA (osteoarthritis) of hip 05/24/2014  . Allergic rhinitis due to pollen 04/01/2012  . Back pain 11/22/2010  . NEOPLASM OF UNCERTAIN BEHAVIOR OF SKIN 06/05/2010  . Swallowing difficulty 06/04/2010  . GERD 07/30/2009  . Hyperlipidemia with target LDL less than 70 03/02/2007  . Depression 03/02/2007    Past Surgical History:  Procedure Laterality Date  . ANKLE SURGERY  Years ago   Left; "had to take out a floater"  . CARPAL TUNNEL RELEASE Left 10/12016  . CESAREAN SECTION  1990  . CORONARY ANGIOPLASTY WITH STENT PLACEMENT  1997; 2007, 2013   "2 + 2; + 1= total of 5  . CORONARY ARTERY BYPASS GRAFT N/A 05/26/2017   Procedure: CORONARY ARTERY BYPASS GRAFTING (CABG), ON PUMP, TIMES Three, using left internal mammary artery and right greater saphenous vein harvested endoscopically;  Surgeon: [name], MD;  Location: [place];  Service: Open Heart Surgery;  Laterality: N/A;  LIMA to LAD, SVG to OM 1, SVG to PDA  . LEFT HEART CATH AND CORONARY ANGIOGRAPHY N/A 05/26/2017   Procedure: LEFT HEART CATH AND CORONARY ANGIOGRAPHY;  Surgeon: Belva Crome, MD;  Location: [place] CV LAB;  Service: Cardiovascular;  Laterality: N/A;  . LEFT HEART CATHETERIZATION WITH CORONARY ANGIOGRAM  N/A 05/18/2012   Procedure: LEFT HEART CATHETERIZATION WITH CORONARY ANGIOGRAM;  Surgeon: Wellington Hampshire, MD;  Location: [place] CATH LAB;  Service: Cardiovascular;  Laterality: N/A;  . [place]; 2009  . TEE WITHOUT CARDIOVERSION N/A 05/26/2017   Procedure: TRANSESOPHAGEAL ECHOCARDIOGRAM (TEE);  Surgeon: [name], MD;  Location: [place];  Service: Open Heart Surgery;  Laterality: N/A;  . TOTAL HIP ARTHROPLASTY Right 05/24/2014   Procedure: RIGHT TOTAL HIP ARTHROPLASTY ANTERIOR APPROACH;  Surgeon: Gearlean Alf, MD;  [facility];   Service: Orthopedics;  Laterality: Right;     OB History    Gravida  2   Para  2   Term      Preterm      AB      Living  2     SAB      TAB      Ectopic      Multiple      Live Births               Home Medications    Prior to Admission medications   Medication Sig Start Date End Date Taking? Authorizing Provider  acetaminophen (TYLENOL) 325 MG tablet Take 2 tablets (650 mg total) by mouth every 6 (six) hours as needed for fever, headache, mild pain or moderate pain. 06/04/17  Yes Lars Pinks M, PA-C  ALPRAZolam Duanne Moron) 0.25 MG tablet Take 1 tablet (0.25 mg total) by mouth 2 (two) times daily as needed for up to 30 doses for anxiety. 06/09/18  Yes Nafziger, Tommi Rumps, NP  atorvastatin (LIPITOR) 80 MG tablet TAKE 1 TABLET BY MOUTH EVERY DAY AT 6PM Patient taking differently: Take 80 mg by mouth daily at 6 PM.  08/10/17  Yes Weaver, Scott T, PA-C  baclofen (LIORESAL) 10 MG tablet TAKE 1 TABLET BY MOUTH EVERYDAY AT BEDTIME Patient taking differently: Take 10 mg by mouth at bedtime as needed for muscle spasms.  06/09/18  Yes Nafziger, Tommi Rumps, NP  benzonatate (TESSALON) 200 MG capsule Take 200 mg by mouth daily.  06/20/18  Yes [provider]  clopidogrel (PLAVIX) 75 MG tablet Take 1 tablet (75 mg total) by mouth daily. 08/28/17  Yes Belva Crome, MD  FLUoxetine (PROZAC) 20 MG capsule Take 60 mg by mouth daily.  03/03/18  Yes [provider]  hydrOXYzine (ATARAX/VISTARIL) 25 MG tablet Take 25-50 mg by mouth at bedtime as needed for anxiety or itching.    Yes [provider]  meclizine (ANTIVERT) 25 MG tablet Take 25 mg by mouth 2 (two) times daily as needed for dizziness.   Yes [provider]  Multiple Vitamin (MULTIVITAMIN WITH MINERALS) TABS tablet Take 1 tablet by mouth at bedtime.    Yes [provider]  pentosan polysulfate (ELMIRON) 100 MG capsule Take 200 mg by mouth 2 (two) times daily as needed (side pains).  04/09/17   Yes [provider]  predniSONE (DELTASONE) 10 MG tablet 40 mg x 3 days, 20 mg x 3 days, 10 mg x 3 days Patient taking differently: Take 10-40 mg by mouth See admin instructions. Take 40 mg x 3 days then 20 mg x 3 days and then 10 mg x 3 days 06/25/18  Yes Nafziger, Tommi Rumps, NP  Probiotic Product (PROBIOTIC PO) Take 1 capsule by mouth at bedtime. [employer]'s brand   Yes [provider]  traZODone (DESYREL) 100 MG tablet Take 150 mg by mouth at bedtime.    Yes  [provider]  VENTOLIN HFA 108 (90 Base) MCG/ACT inhaler Inhale 2 puffs into the lungs every 6 (six) hours as needed for wheezing or shortness of breath.  06/20/18  Yes [provider]  albuterol (PROVENTIL HFA;VENTOLIN HFA) 108 (90 Base) MCG/ACT inhaler Inhale 2 puffs into the lungs every 4 (four) hours as needed for wheezing or shortness of breath. 06/29/18   Elah Avellino C, PA-C  doxycycline (VIBRAMYCIN) 100 MG capsule Take 1 capsule (100 mg total) by mouth 2 (two) times daily. Patient not taking: Reported on 06/29/2018 06/09/18   [name], NP  levofloxacin (LEVAQUIN) 750 MG tablet Take 1 tablet (750 mg total) by mouth daily. Patient not taking: Reported on 06/29/2018 06/25/18   [name], NP  polyethylene glycol-electrolytes (NULYTELY/GOLYTELY) 420 g solution Take 4,000 mLs by mouth as directed. Patient not taking: Reported on 06/29/2018 03/02/18   Milus Banister, MD    Family History Family History  Problem Relation Age of Onset  . Cancer Paternal Grandfather        Esophageal  . Coronary artery disease Mother   . Diabetes Mother   . Hypertension Mother   . Dementia Father   . Sudden death Brother        Suicide    Social History Social History   Tobacco Use  . Smoking status: Former Smoker    Packs/day: 0.50    Years: 50.00    Pack years: 25.00    Types: Cigarettes    Last attempt to quit: 06/01/2017    Years since quitting: 1.0  . Smokeless tobacco: Never Used    Substance Use Topics  . Alcohol use: No    Alcohol/week: 0.0 standard drinks  . Drug use: No     Allergies   Oxycodone hcl; Penicillins; Tramadol; Aspirin; Macrobid [nitrofurantoin monohyd macro]; and Sulfa antibiotics   Review of Systems Review of Systems  Constitutional: Negative for chills and fever.  HENT: Positive for congestion.   Respiratory: Positive for cough. Negative for shortness of breath.   Cardiovascular: Negative for leg swelling.  Gastrointestinal: Negative for abdominal pain, diarrhea, nausea and vomiting.  Neurological: Negative for dizziness and syncope.  All other systems reviewed and are negative.    Physical Exam Updated Vital Signs BP 137/86 (BP Location: Left Arm)   Pulse 71   Temp 98.4 F (36.9 C) (Oral)   Resp 20   Ht 5' 1.5" (1.562 m)   Wt 74.4 kg   LMP  (LMP Unknown)   SpO2 97%   BMI 30.49 kg/m   Physical Exam  Constitutional: She appears well-developed and well-nourished. No distress.  HENT:  Head: Normocephalic and atraumatic.  Eyes: Conjunctivae are normal.  Neck: Neck supple.  Cardiovascular: Normal rate, regular rhythm, normal heart sounds and intact distal pulses.  Pulmonary/Chest: Effort normal and breath sounds normal. No respiratory distress.  No increased work of breathing.  Speaks in full sentences without difficulty.    Abdominal: Soft. There is no tenderness. There is no guarding.  Musculoskeletal: She exhibits no edema.  Lymphadenopathy:    She has no cervical adenopathy.  Neurological: She is alert.  Skin: Skin is warm and dry. She is not diaphoretic.  Psychiatric: She has a normal mood and affect. Her behavior is normal.  Nursing note and vitals reviewed.    ED Treatments / Results  Labs (all labs ordered are listed, but only abnormal results are displayed) Labs Reviewed  BASIC METABOLIC PANEL - Abnormal; Notable for the following components:  Result Value   Sodium 132 (*)    Glucose, Bld 132 (*)     Creatinine, Ser 1.03 (*)    GFR calc non Af Amer 53 (*)    All other components within normal limits  CBC  I-STAT TROPONIN, ED  POCT I-STAT TROPONIN I    EKG EKG Interpretation  Date/Time:  Tuesday June 29 2018 16:23:58 EST Ventricular Rate:  73 PR Interval:    QRS Duration: 87 QT Interval:  386 QTC Calculation: 426 R Axis:   63 Text Interpretation:  Sinus rhythm When compared to prior, no significant changes seen. \ No STEMI Confirmed by Antony Blackbird [id number]) on 06/29/2018 7:12:16 PM   Radiology Dg Chest 2 View  Result Date: 06/29/2018 CLINICAL DATA:  Cough. EXAM: CHEST - 2 VIEW COMPARISON:  06/29/2017. FINDINGS: Prior CABG. Heart size normal. No focal infiltrate. No pleural effusion or pneumothorax. No acute bony abnormality. IMPRESSION: Prior CABG.  Heart size normal.  No acute cardiopulmonary disease. Electronically Signed   By: Marcello Moores  Register   On: 06/29/2018 17:14    Procedures Procedures (including critical care time)  Medications Ordered in ED Medications - No data to display   Initial Impression / Assessment and Plan / ED Course  I have reviewed the triage vital signs and the nursing notes.  Pertinent labs & imaging results that were available during my care of the patient were reviewed by me and considered in my medical decision making (see chart for details).     Patient presents with persistent cough. Patient is nontoxic appearing, afebrile, not tachycardic, not tachypneic, not hypotensive, maintains excellent SPO2 on room air, and is in no apparent distress.  No acute abnormalities on chest x-ray.  Troponin negative.  She will follow-up with her PCP. The patient was given instructions for home care as well as return precautions. Patient voices understanding of these instructions, accepts the plan, and is comfortable with discharge.    Findings and plan of care discussed with Antony Blackbird, MD. Dr. Sherry Ruffing personally evaluated and examined this  patient.  Vitals:   06/29/18 1626 06/29/18 1915 06/29/18 1918 06/29/18 2149  BP: 137/86 (!) 142/86 (!) 142/86 124/61  Pulse: 71 63 60 (!) 59  Resp: 20 18 13 14   Temp: 98.4 F (36.9 C)     TempSrc: Oral     SpO2: 97% 98% 97% 99%  Weight: 74.4 kg     Height: 5' 1.5" (1.562 m)        Final Clinical Impressions(s) / ED Diagnoses   Final diagnoses:  Cough    ED Discharge Orders         Ordered    albuterol (PROVENTIL HFA;VENTOLIN HFA) 108 (90 Base) MCG/ACT inhaler  Every 4 hours PRN     06/29/18 2130           Lorayne Bender, PA-C 06/29/18 2244    Tegeler, Gwenyth Allegra, MD 06/30/18 1218

## 2018-07-05 ENCOUNTER — Encounter: Payer: Self-pay | Admitting: Family Medicine

## 2018-07-06 ENCOUNTER — Encounter: Payer: Self-pay | Admitting: Adult Health

## 2018-07-06 ENCOUNTER — Ambulatory Visit: Payer: BLUE CROSS/BLUE SHIELD | Admitting: Adult Health

## 2018-07-06 VITALS — BP 120/60 | Temp 97.4°F | Wt 164.0 lb

## 2018-07-06 DIAGNOSIS — J069 Acute upper respiratory infection, unspecified: Secondary | ICD-10-CM

## 2018-07-06 DIAGNOSIS — N949 Unspecified condition associated with female genital organs and menstrual cycle: Secondary | ICD-10-CM

## 2018-07-06 LAB — POC URINALSYSI DIPSTICK (AUTOMATED)
BILIRUBIN UA: NEGATIVE
Blood, UA: NEGATIVE
Glucose, UA: NEGATIVE
Ketones, UA: NEGATIVE
Leukocytes, UA: NEGATIVE
Nitrite, UA: NEGATIVE
Protein, UA: NEGATIVE
SPEC GRAV UA: 1.015 (ref 1.010–1.025)
Urobilinogen, UA: 0.2 E.U./dL
pH, UA: 6 (ref 5.0–8.0)

## 2018-07-06 NOTE — Progress Notes (Signed)
Subjective:    Patient ID: [name], female    DOB: [birth year], 72 y.o.   MRN: [id number]  HPI  72 year old female who  has a past medical history of Arthritis, Blood transfusion without reported diagnosis, CAD (coronary artery disease), Carpal tunnel syndrome on both sides, Chronic lower back pain, Depression, GERD (gastroesophageal reflux disease), Hyperlipidemia, Myocardial infarction ([place]) (1997, 2007, 2013), and Numbness of foot.  She presents to the office for two acute complaints.   1. Continued URI like symptoms. She has been prescribed multiple abx over the month as well as a steroid pack. She was seen in the ER over the weekend for cough. Chest xray was negative as were labs. She reports today that her cough has improved but she continues to have sinus pressure and runny nose.   2. She complains of vaginal burning. Denies any drainage or discharge. This is a chronic issue for the last two years. She has been seen by multiple GYN's and has been referred to urology for which a cystoscopy has been done. She has tried vaginal creams and mediations for IC over the years. Nothing has helped with her symptoms. She denies UTI like symptoms   Review of Systems See HPI   Past Medical History:  Diagnosis Date  . Arthritis   . Blood transfusion without reported diagnosis   . CAD (coronary artery disease)    a. 1997 MI/PCI RCA;  b. 2007 MI/PCI of 100% RCA with Taxus DES x 3 placed, EF 55%;  c. 2010 Cath: stable anatomy;  d. 05/2012 NSTEMI/Cath/PCI: LM nl, LAD 60-75m, D1 20ost, LCX 81m, RCA 40-47m ISR, 60/95d (Treated w/ 2.75x33 Xience Xpedition DES), PDA 61m (Treated w/ PTCA), EF 60%.  . Carpal tunnel syndrome on both sides   . Chronic lower back pain   . Depression   . GERD (gastroesophageal reflux disease)   . Hyperlipidemia   . Myocardial infarction [facility]) 1997, 2007, 2013  . Numbness of foot    Left foot - back surgery 2009    Social History   Socioeconomic History  .  Marital status: Married    Spouse name: Not on file  . Number of children: Not on file  . Years of education: Not on file  . Highest education level: Not on file  Occupational History  . Not on file  Social Needs  . Financial resource strain: Not on file  . Food insecurity:    Worry: Not on file    Inability: Not on file  . Transportation needs:    Medical: Not on file    Non-medical: Not on file  Tobacco Use  . Smoking status: Former Smoker    Packs/day: 0.50    Years: 50.00    Pack years: 25.00    Types: Cigarettes    Last attempt to quit: 06/01/2017    Years since quitting: 1.0  . Smokeless tobacco: Never Used  Substance and Sexual Activity  . Alcohol use: No    Alcohol/week: 0.0 standard drinks  . Drug use: No  . Sexual activity: Not Currently    Birth control/protection: Post-menopausal  Lifestyle  . Physical activity:    Days per week: Not on file    Minutes per session: Not on file  . Stress: Not on file  Relationships  . Social connections:    Talks on phone: Not on file    Gets together: Not on file    Attends religious service: Not on file  Active member of club or organization: Not on file    Attends meetings of clubs or organizations: Not on file    Relationship status: Not on file  . Intimate partner violence:    Fear of current or ex partner: Not on file    Emotionally abused: Not on file    Physically abused: Not on file    Forced sexual activity: Not on file  Other Topics Concern  . Not on file  Social History Narrative   Is a homemaker   Married for 29 years    Has a daughter and son    Pets: Two dogs and a Neurosurgeon   Likes to garden ( flower beds).        Past Surgical History:  Procedure Laterality Date  . ANKLE SURGERY  Years ago   Left; "had to take out a floater"  . CARPAL TUNNEL RELEASE Left 10/12016  . CESAREAN SECTION  1990  . CORONARY ANGIOPLASTY WITH STENT PLACEMENT  1997; 2007, 2013   "2 + 2; + 1= total of 5  . CORONARY  ARTERY BYPASS GRAFT N/A 05/26/2017   Procedure: CORONARY ARTERY BYPASS GRAFTING (CABG), ON PUMP, TIMES Three, using left internal mammary artery and right greater saphenous vein harvested endoscopically;  Surgeon: [name], MD;  Location: [place];  Service: Open Heart Surgery;  Laterality: N/A;  LIMA to LAD, SVG to OM 1, SVG to PDA  . LEFT HEART CATH AND CORONARY ANGIOGRAPHY N/A 05/26/2017   Procedure: LEFT HEART CATH AND CORONARY ANGIOGRAPHY;  Surgeon: Belva Crome, MD;  Location: [place] CV LAB;  Service: Cardiovascular;  Laterality: N/A;  . LEFT HEART CATHETERIZATION WITH CORONARY ANGIOGRAM N/A 05/18/2012   Procedure: LEFT HEART CATHETERIZATION WITH CORONARY ANGIOGRAM;  Surgeon: Wellington Hampshire, MD;  Location: [place] CATH LAB;  Service: Cardiovascular;  Laterality: N/A;  . [place]; 2009  . TEE WITHOUT CARDIOVERSION N/A 05/26/2017   Procedure: TRANSESOPHAGEAL ECHOCARDIOGRAM (TEE);  Surgeon: [name], MD;  Location: [place];  Service: Open Heart Surgery;  Laterality: N/A;  . TOTAL HIP ARTHROPLASTY Right 05/24/2014   Procedure: RIGHT TOTAL HIP ARTHROPLASTY ANTERIOR APPROACH;  Surgeon: Gearlean Alf, MD;  [facility];  Service: Orthopedics;  Laterality: Right;    Family History  Problem Relation Age of Onset  . Cancer Paternal Grandfather        Esophageal  . Coronary artery disease Mother   . Diabetes Mother   . Hypertension Mother   . Dementia Father   . Sudden death Brother        Suicide    Allergies  Allergen Reactions  . Oxycodone Hcl Itching and Other (See Comments)    Confusion, hallucinations  . Penicillins Itching and Rash    Has patient had a PCN reaction causing immediate rash, facial/tongue/throat swelling, SOB or lightheadedness with hypotension: yes rash that took a while to go away across the abdomen Has patient had a PCN reaction causing severe rash involving mucus membranes or skin necrosis: no Has patient had a PCN  reaction that required hospitalization: no Has patient had a PCN reaction occurring within the last 10 years: No If all of the above answers are "NO", then may proceed with Cephalosporin use.   . Tramadol Itching and Other (See Comments)    hallucinations  . Aspirin Nausea And Vomiting and Other (See Comments)    Burning in stomach- tolerates enteric coated  . Macrobid [employer] Macro]  Nausea And Vomiting  . Sulfa Antibiotics Nausea Only    Current Outpatient Medications on File Prior to Visit  Medication Sig Dispense Refill  . acetaminophen (TYLENOL) 325 MG tablet Take 2 tablets (650 mg total) by mouth every 6 (six) hours as needed for fever, headache, mild pain or moderate pain.    Marland Kitchen albuterol (PROVENTIL HFA;VENTOLIN HFA) 108 (90 Base) MCG/ACT inhaler Inhale 2 puffs into the lungs every 4 (four) hours as needed for wheezing or shortness of breath. 1 Inhaler 0  . ALPRAZolam (XANAX) 0.25 MG tablet Take 1 tablet (0.25 mg total) by mouth 2 (two) times daily as needed for up to 30 doses for anxiety. 30 tablet 0  . atorvastatin (LIPITOR) 80 MG tablet TAKE 1 TABLET BY MOUTH EVERY DAY AT 6PM (Patient taking differently: Take 80 mg by mouth daily at 6 PM. ) 90 tablet 3  . baclofen (LIORESAL) 10 MG tablet TAKE 1 TABLET BY MOUTH EVERYDAY AT BEDTIME (Patient taking differently: Take 10 mg by mouth at bedtime as needed for muscle spasms. ) 15 tablet 0  . benzonatate (TESSALON) 200 MG capsule Take 200 mg by mouth daily.   0  . clopidogrel (PLAVIX) 75 MG tablet Take 1 tablet (75 mg total) by mouth daily. 90 tablet 3  . FLUoxetine (PROZAC) 20 MG capsule Take 60 mg by mouth daily.   1  . hydrOXYzine (ATARAX/VISTARIL) 25 MG tablet Take 25-50 mg by mouth at bedtime as needed for anxiety or itching.     Marland Kitchen levofloxacin (LEVAQUIN) 750 MG tablet Take 1 tablet (750 mg total) by mouth daily. 5 tablet 0  . meclizine (ANTIVERT) 25 MG tablet Take 25 mg by mouth 2 (two) times daily as needed for  dizziness.    . Multiple Vitamin (MULTIVITAMIN WITH MINERALS) TABS tablet Take 1 tablet by mouth at bedtime.     . pentosan polysulfate (ELMIRON) 100 MG capsule Take 200 mg by mouth 2 (two) times daily as needed (side pains).     . polyethylene glycol-electrolytes (NULYTELY/GOLYTELY) 420 g solution Take 4,000 mLs by mouth as directed. 4000 mL 0  . Probiotic Product (PROBIOTIC PO) Take 1 capsule by mouth at bedtime. [employer]'s brand    . traZODone (DESYREL) 100 MG tablet Take 150 mg by mouth at bedtime.     . VENTOLIN HFA 108 (90 Base) MCG/ACT inhaler Inhale 2 puffs into the lungs every 6 (six) hours as needed for wheezing or shortness of breath.   0   No current facility-administered medications on file prior to visit.     BP 120/60   Temp (!) 97.4 F (36.3 C)   Wt 164 lb (74.4 kg)   LMP  (LMP Unknown)   BMI 30.49 kg/m       Objective:   Physical Exam  Constitutional: She is oriented to person, place, and time. She appears well-developed and well-nourished. No distress.  HENT:  Head: Normocephalic and atraumatic.  Right Ear: Hearing, tympanic membrane, external ear and ear canal normal.  Left Ear: Hearing, tympanic membrane, external ear and ear canal normal.  Nose: No mucosal edema or rhinorrhea. Right sinus exhibits no maxillary sinus tenderness and no frontal sinus tenderness. Left sinus exhibits maxillary sinus tenderness. Left sinus exhibits no frontal sinus tenderness.  Mouth/Throat: Oropharynx is clear and moist and mucous membranes are normal. No oropharyngeal exudate. No tonsillar exudate.  Cardiovascular: Normal rate, regular rhythm, normal heart sounds and intact distal pulses.  Pulmonary/Chest: Effort normal and breath sounds normal.  Abdominal: Soft. Bowel sounds are normal. She exhibits no distension and no mass. There is no tenderness. There is no rebound and no guarding. No hernia.  Neurological: She is alert and oriented to person, place, and time.  Skin: Skin  is warm and dry. Capillary refill takes less than 2 seconds. No rash noted. She is not diaphoretic. No erythema. No pallor.  Psychiatric: She has a normal mood and affect. Her behavior is normal. Judgment and thought content normal.  Nursing note and vitals reviewed.     Assessment & Plan:  1. Vaginal burning - Advised I will look into other avenues we can take and follow up with her.  - No change in symptoms, did not think another vaginal exam was warranted at this time  - POCT Urinalysis Dipstick (Automated)- negative   2. Upper respiratory tract infection, unspecified type - Trial Flonase.  - No need for additional abx  - Can consider Astelin nasal spray   [name], NP

## 2018-07-13 DIAGNOSIS — F332 Major depressive disorder, recurrent severe without psychotic features: Secondary | ICD-10-CM | POA: Diagnosis not present

## 2018-08-10 ENCOUNTER — Other Ambulatory Visit: Payer: Self-pay | Admitting: Physician Assistant

## 2018-08-19 ENCOUNTER — Encounter: Payer: Self-pay | Admitting: Adult Health

## 2018-08-19 ENCOUNTER — Telehealth: Payer: Self-pay | Admitting: Family Medicine

## 2018-08-19 NOTE — Telephone Encounter (Signed)
Copied from [place] [id number]. Topic: General - Other >> Aug 13, 2018 12:42 PM Alanda Slim E wrote: Reason for CRM: Pt received a jury duty summons and is not able to serve due to back and hip issues and needs [name] to supply a note that states she is unable to serve/  Pt also need paperwork/card to get handicap stickers or plates / please advise

## 2018-08-19 NOTE — Telephone Encounter (Signed)
Letter written. Ok for handicap placecards

## 2018-08-20 NOTE — Telephone Encounter (Signed)
Pt notified to pick up at the front desk.  Nothing further needed. 

## 2018-09-07 DIAGNOSIS — F332 Major depressive disorder, recurrent severe without psychotic features: Secondary | ICD-10-CM | POA: Diagnosis not present

## 2018-09-08 ENCOUNTER — Other Ambulatory Visit: Payer: Self-pay | Admitting: Interventional Cardiology

## 2018-09-09 DIAGNOSIS — M48061 Spinal stenosis, lumbar region without neurogenic claudication: Secondary | ICD-10-CM | POA: Diagnosis not present

## 2018-09-09 DIAGNOSIS — M961 Postlaminectomy syndrome, not elsewhere classified: Secondary | ICD-10-CM | POA: Diagnosis not present

## 2018-09-20 ENCOUNTER — Encounter: Payer: Self-pay | Admitting: Gastroenterology

## 2018-09-20 ENCOUNTER — Encounter: Payer: Self-pay | Admitting: Nurse Practitioner

## 2018-09-29 ENCOUNTER — Ambulatory Visit: Payer: BLUE CROSS/BLUE SHIELD | Admitting: Nurse Practitioner

## 2018-09-29 ENCOUNTER — Telehealth: Payer: Self-pay

## 2018-09-29 ENCOUNTER — Encounter: Payer: Self-pay | Admitting: Nurse Practitioner

## 2018-09-29 VITALS — BP 124/60 | HR 77 | Ht 62.0 in | Wt 171.8 lb

## 2018-09-29 DIAGNOSIS — R194 Change in bowel habit: Secondary | ICD-10-CM

## 2018-09-29 NOTE — Telephone Encounter (Signed)
[facility] Pre-operative Risk Assessment     Request for surgical clearance:     Endoscopy Procedure  What type of surgery is being performed?     Colonoscopy  When is this surgery scheduled?     11/03/18  What type of clearance is required ?   Pharmacy  Are there any medications that need to be held prior to surgery and how long? HOLD PLAVIX FOR 5 DAYS PRIOR  Practice name and name of physician performing surgery?      [facility]/ Dr. Ardis Hughs  What is your office phone and fax number?      Phone- [phone]  [phone]  Anesthesia type (None, local, MAC, general) ?       MAC

## 2018-09-29 NOTE — Patient Instructions (Addendum)
If you are age 73 or older, your body mass index should be between 23-30. Your Body mass index is 31.42 kg/m. If this is out of the aforementioned range listed, please consider follow up with your Primary Care Provider.  If you are age 73 or younger, your body mass index should be between 19-25. Your Body mass index is 31.42 kg/m. If this is out of the aformentioned range listed, please consider follow up with your Primary Care Provider.   You have been scheduled for a colonoscopy. Please follow written instructions given to you at your visit today.  Please pick up your prep supplies at the pharmacy within the next 1-3 days. If you use inhalers (even only as needed), please bring them with you on the day of your procedure. Your physician has requested that you go to www.startemmi.com and enter the access code given to you at your visit today. This web site gives a general overview about your procedure. However, you should still follow specific instructions given to you by our office regarding your preparation for the procedure.   Thank you for choosing me and [facility].   Tye Savoy, NP

## 2018-09-29 NOTE — Progress Notes (Signed)
ASSESSMENT / PLAN:    85. 73 yo female with bowel changes. Was scheduled for colonoscopy for this several months ago but postponed due to a fall with back injury.  -Patient would like to proceed with colonoscopy. She will need to be off Plavix. The risks and benefits of colonoscopy with possible polypectomy were discussed and the patient agrees to proceed.   2. CAD / MI / remote stents / CABG in 2018. On chronic plavix.  -Hold Plavix for 5 days before procedure - will instruct when and how to resume after procedure. Patient understands that there is a low but real risk of cardiovascular event such as heart attack, stroke, or embolism /  thrombosis, or ischemia while off Plavix. The patient consents to proceed. Will communicate by phone or EMR with patient's prescribing provider to confirm that holding Plavix is reasonable in this case.     HPI:    Chief Complaint:   Bowel changes  Patient is a 73 year old female with CAD / hx of MI / stenting in 2007 / CABG 2018. known to Dr. Ardis Hughs.  She last saw him July 2019 for evaluation of constipation.  She also had some unintentional weight loss of 19 pounds over the previous year, this has stablized.  Patient was started on a fiber supplement, we recommended colonoscopy colonoscopy.She didn't get colonoscopy due to a fall resulting in a back injury.   Oni is back with bowel changes. No longer constipated, despite never starting fiber. Her stools frequently different shapes / sizes. She hasn't seen any blood with BMs. She has not abdominal pain but just concerned about the changes in bowel frequency and consistency. She now has a BM everyday which is unusual for her. No med changes to which bowel changes can be attributed. TSH minimally elevated at last check in 2018 but free T4 at the time was normal.    Data Reviewed:  Labs  06/29/18 Cr 1.03, GFR >60 CBC normal    Past Medical History:  Diagnosis Date  . Arthritis   .  Blood transfusion without reported diagnosis   . CAD (coronary artery disease)    a. 1997 MI/PCI RCA;  b. 2007 MI/PCI of 100% RCA with Taxus DES x 3 placed, EF 55%;  c. 2010 Cath: stable anatomy;  d. 05/2012 NSTEMI/Cath/PCI: LM nl, LAD 60-41m, D1 20ost, LCX 70m, RCA 40-64m ISR, 60/95d (Treated w/ 2.75x33 Xience Xpedition DES), PDA 86m (Treated w/ PTCA), EF 60%.  . Carpal tunnel syndrome on both sides   . Chronic lower back pain   . Depression   . GERD (gastroesophageal reflux disease)   . Hyperlipidemia   . Myocardial infarction [facility]) 1997, 2007, 2013  . Numbness of foot    Left foot - back surgery 2009     Past Surgical History:  Procedure Laterality Date  . ANKLE SURGERY  Years ago   Left; "had to take out a floater"  . CARPAL TUNNEL RELEASE Left 10/12016  . CESAREAN SECTION  1990  . CORONARY ANGIOPLASTY WITH STENT PLACEMENT  1997; 2007, 2013   "2 + 2; + 1= total of 5  . CORONARY ARTERY BYPASS GRAFT N/A 05/26/2017   Procedure: CORONARY ARTERY BYPASS GRAFTING (CABG), ON PUMP, TIMES Three, using left internal mammary artery and right greater saphenous vein harvested endoscopically;  Surgeon: [name], MD;  Location: [place];  Service: Open Heart Surgery;  Laterality: N/A;  LIMA to LAD, SVG to OM 1, SVG to PDA  . LEFT HEART CATH AND CORONARY ANGIOGRAPHY N/A 05/26/2017   Procedure: LEFT HEART CATH AND CORONARY ANGIOGRAPHY;  Surgeon: Belva Crome, MD;  Location: [place] CV [facility];  Service: Cardiovascular;  Laterality: N/A;  . LEFT HEART CATHETERIZATION WITH CORONARY ANGIOGRAM N/A 05/18/2012   Procedure: LEFT HEART CATHETERIZATION WITH CORONARY ANGIOGRAM;  Surgeon: Wellington Hampshire, MD;  Location: [place] CATH LAB;  Service: Cardiovascular;  Laterality: N/A;  . [place]; 2009  . TEE WITHOUT CARDIOVERSION N/A 05/26/2017   Procedure: TRANSESOPHAGEAL ECHOCARDIOGRAM (TEE);  Surgeon: [name], MD;  Location: [place];  Service: Open Heart Surgery;   Laterality: N/A;  . TOTAL HIP ARTHROPLASTY Right 05/24/2014   Procedure: RIGHT TOTAL HIP ARTHROPLASTY ANTERIOR APPROACH;  Surgeon: Gearlean Alf, MD;  Location: [facility];  Service: Orthopedics;  Laterality: Right;   Family History  Problem Relation Age of Onset  . Cancer Paternal Grandfather        Esophageal  . Coronary artery disease Mother   . Diabetes Mother   . Hypertension Mother   . Dementia Father   . Sudden death Brother        Suicide   Social History   Tobacco Use  . Smoking status: Former Smoker    Packs/day: 0.50    Years: 50.00    Pack years: 25.00    Types: Cigarettes    Last attempt to quit: 06/01/2017    Years since quitting: 1.3  . Smokeless tobacco: Never Used  Substance Use Topics  . Alcohol use: No    Alcohol/week: 0.0 standard drinks  . Drug use: No   Current Outpatient Medications  Medication Sig Dispense Refill  . acetaminophen (TYLENOL) 325 MG tablet Take 2 tablets (650 mg total) by mouth every 6 (six) hours as needed for fever, headache, mild pain or moderate pain.    Marland Kitchen albuterol (PROVENTIL HFA;VENTOLIN HFA) 108 (90 Base) MCG/ACT inhaler Inhale 2 puffs into the lungs every 4 (four) hours as needed for wheezing or shortness of breath. 1 Inhaler 0  . ALPRAZolam (XANAX) 0.25 MG tablet Take 1 tablet (0.25 mg total) by mouth 2 (two) times daily as needed for up to 30 doses for anxiety. 30 tablet 0  . atorvastatin (LIPITOR) 80 MG tablet Take 1 tablet (80 mg total) by mouth daily at 6 PM. Please make yearly appt with Dr. Tamala Julian for March for future refills. 1st attempt 30 tablet 2  . baclofen (LIORESAL) 10 MG tablet TAKE 1 TABLET BY MOUTH EVERYDAY AT BEDTIME (Patient taking differently: Take 10 mg by mouth at bedtime as needed for muscle spasms. ) 15 tablet 0  . benzonatate (TESSALON) 200 MG capsule Take 200 mg by mouth daily.   0  . clopidogrel (PLAVIX) 75 MG tablet Take 1 tablet (75 mg total) by mouth daily. Please make yearly appt with Dr. Tamala Julian for March  for future refills. 1st attempt 30 tablet 1  . FLUoxetine (PROZAC) 20 MG capsule Take 60 mg by mouth daily.   1  . hydrOXYzine (ATARAX/VISTARIL) 25 MG tablet Take 25-50 mg by mouth at bedtime as needed for anxiety or itching.     Marland Kitchen levofloxacin (LEVAQUIN) 750 MG tablet Take 1 tablet (750 mg total) by mouth daily. 5 tablet 0  . meclizine (ANTIVERT) 25 MG tablet Take 25 mg by mouth 2 (two) times daily as needed for dizziness.    Marland Kitchen  Multiple Vitamin (MULTIVITAMIN WITH MINERALS) TABS tablet Take 1 tablet by mouth at bedtime.     . pentosan polysulfate (ELMIRON) 100 MG capsule Take 200 mg by mouth 2 (two) times daily as needed (side pains).     . polyethylene glycol-electrolytes (NULYTELY/GOLYTELY) 420 g solution Take 4,000 mLs by mouth as directed. 4000 mL 0  . Probiotic Product (PROBIOTIC PO) Take 1 capsule by mouth at bedtime. Nature Bounty's brand    . traZODone (DESYREL) 100 MG tablet Take 150 mg by mouth at bedtime.     . VENTOLIN HFA 108 (90 Base) MCG/ACT inhaler Inhale 2 puffs into the lungs every 6 (six) hours as needed for wheezing or shortness of breath.   0   No current facility-administered medications for this visit.    Allergies  Allergen Reactions  . Oxycodone Hcl Itching and Other (See Comments)    Confusion, hallucinations  . Penicillins Itching and Rash    Has patient had a PCN reaction causing immediate rash, facial/tongue/throat swelling, SOB or lightheadedness with hypotension: yes rash that took a while to go away across the abdomen Has patient had a PCN reaction causing severe rash involving mucus membranes or skin necrosis: no Has patient had a PCN reaction that required hospitalization: no Has patient had a PCN reaction occurring within the last 10 years: No If all of the above answers are "NO", then may proceed with Cephalosporin use.   . Tramadol Itching and Other (See Comments)    hallucinations  . Aspirin Nausea And Vomiting and Other (See Comments)    Burning in  stomach- tolerates enteric coated  . Macrobid [Nitrofurantoin Monohyd Macro] Nausea And Vomiting  . Sulfa Antibiotics Nausea Only     Review of Systems: All systems reviewed and negative except where noted in HPI.   Creatinine clearance cannot be calculated (Patient's most recent lab result is older than the maximum 21 days allowed.)   Physical Exam:    Wt Readings from Last 3 Encounters:  09/29/18 171 lb 12.8 oz (77.9 kg)  07/06/18 164 lb (74.4 kg)  06/29/18 164 lb (74.4 kg)    BP 124/60   Pulse 77   Ht 5\' 2"  (1.575 m)   Wt 171 lb 12.8 oz (77.9 kg)   LMP  (LMP Unknown)   SpO2 96%   BMI 31.42 kg/m  Constitutional:  Pleasant female in no acute distress. Psychiatric: Normal mood and affect. Behavior is normal. EENT: Pupils normal.  Conjunctivae are normal. No scleral icterus. Neck supple.  Cardiovascular: Normal rate, regular rhythm. No edema Pulmonary/chest: Effort normal and breath sounds normal. No wheezing, rales or rhonchi. Abdominal: Soft, nondistended, nontender. Bowel sounds active throughout. There are no masses palpable. No hepatomegaly. Neurological: Alert and oriented to person place and time. Skin: Skin is warm and dry. No rashes noted.  Tye Savoy, NP  09/29/2018, 11:07 AM

## 2018-09-29 NOTE — Telephone Encounter (Signed)
Dr. Tamala Julian, ok from your perspective to hold plavix for 5 days prior to colonoscopy?

## 2018-09-30 NOTE — Progress Notes (Signed)
I agree with the above note, plan 

## 2018-10-02 NOTE — Telephone Encounter (Signed)
Okay to hold plvix as requested.

## 2018-10-05 ENCOUNTER — Encounter: Payer: Self-pay | Admitting: Adult Health

## 2018-10-05 ENCOUNTER — Telehealth: Payer: Self-pay

## 2018-10-05 ENCOUNTER — Ambulatory Visit: Payer: BLUE CROSS/BLUE SHIELD | Admitting: Adult Health

## 2018-10-05 VITALS — BP 130/78 | Temp 98.0°F | Wt 167.0 lb

## 2018-10-05 DIAGNOSIS — B9789 Other viral agents as the cause of diseases classified elsewhere: Secondary | ICD-10-CM

## 2018-10-05 DIAGNOSIS — J329 Chronic sinusitis, unspecified: Secondary | ICD-10-CM

## 2018-10-05 DIAGNOSIS — R6889 Other general symptoms and signs: Secondary | ICD-10-CM

## 2018-10-05 LAB — POC INFLUENZA A&B (BINAX/QUICKVUE)
Influenza A, POC: NEGATIVE
Influenza B, POC: NEGATIVE

## 2018-10-05 NOTE — Telephone Encounter (Signed)
Spoke with patient this morning.  Per Dr. Tamala Julian okay to hold Plavix five days prior to colonoscopy. Patient verbalized understanding.

## 2018-10-05 NOTE — Progress Notes (Signed)
Subjective:    Patient ID: Bianca [name], female    DOB: 06/20/1946, 73 y.o.   MRN: [id number]  URI   This is a new problem. The current episode started in the past 7 days (3 days ). The problem has been unchanged. There has been no fever. Associated symptoms include congestion and rhinorrhea. Pertinent negatives include no chest pain, coughing, diarrhea, ear pain, headaches, nausea, rash, sinus pain, sore throat, swollen glands, vomiting or wheezing. She has tried nothing for the symptoms.      Review of Systems  Constitutional: Negative.   HENT: Positive for congestion, postnasal drip and rhinorrhea. Negative for ear pain, sinus pressure, sinus pain, sore throat and trouble swallowing.   Respiratory: Negative for cough and wheezing.   Cardiovascular: Negative for chest pain.  Gastrointestinal: Negative for diarrhea, nausea and vomiting.  Musculoskeletal: Negative.   Skin: Negative for rash.  Neurological: Negative for headaches.  All other systems reviewed and are negative.  Past Medical History:  Diagnosis Date  . Arthritis   . Blood transfusion without reported diagnosis   . CAD (coronary artery disease)    a. 1997 MI/PCI RCA;  b. 2007 MI/PCI of 100% RCA with Taxus DES x 3 placed, EF 55%;  c. 2010 Cath: stable anatomy;  d. 05/2012 NSTEMI/Cath/PCI: LM nl, LAD 60-61m, D1 20ost, LCX 36m, RCA 40-35m ISR, 60/95d (Treated w/ 2.75x33 Xience Xpedition DES), PDA 87m (Treated w/ PTCA), EF 60%.  . Carpal tunnel syndrome on both sides   . Chronic lower back pain   . Depression   . GERD (gastroesophageal reflux disease)   . Hyperlipidemia   . Myocardial infarction [facility]) 1997, 2007, 2013  . Numbness of foot    Left foot - back surgery 2009    Social History   Socioeconomic History  . Marital status: Married    Spouse name: Not on file  . Number of children: Not on file  . Years of education: Not on file  . Highest education level: Not on file  Occupational History  . Not on file   Social Needs  . Financial resource strain: Not on file  . Food insecurity:    Worry: Not on file    Inability: Not on file  . Transportation needs:    Medical: Not on file    Non-medical: Not on file  Tobacco Use  . Smoking status: Former Smoker    Packs/day: 0.50    Years: 50.00    Pack years: 25.00    Types: Cigarettes    Last attempt to quit: 06/01/2017    Years since quitting: 1.3  . Smokeless tobacco: Never Used  Substance and Sexual Activity  . Alcohol use: No    Alcohol/week: 0.0 standard drinks  . Drug use: No  . Sexual activity: Not Currently    Birth control/protection: Post-menopausal  Lifestyle  . Physical activity:    Days per week: Not on file    Minutes per session: Not on file  . Stress: Not on file  Relationships  . Social connections:    Talks on phone: Not on file    Gets together: Not on file    Attends religious service: Not on file    Active member of club or organization: Not on file    Attends meetings of clubs or organizations: Not on file    Relationship status: Not on file  . Intimate partner violence:    Fear of current or ex partner: Not on file  Emotionally abused: Not on file    Physically abused: Not on file    Forced sexual activity: Not on file  Other Topics Concern  . Not on file  Social History Narrative   Is a homemaker   Married for 63 years    Has a daughter and son    Pets: Two dogs and a Neurosurgeon   Likes to garden ( flower beds).        Past Surgical History:  Procedure Laterality Date  . ANKLE SURGERY  Years ago   Left; "had to take out a floater"  . CARPAL TUNNEL RELEASE Left 10/12016  . CESAREAN SECTION  1990  . CORONARY ANGIOPLASTY WITH STENT PLACEMENT  1997; 2007, 2013   "2 + 2; + 1= total of 5  . CORONARY ARTERY BYPASS GRAFT N/A 05/26/2017   Procedure: CORONARY ARTERY BYPASS GRAFTING (CABG), ON PUMP, TIMES Three, using left internal mammary artery and right greater saphenous vein harvested endoscopically;   Surgeon: [name], MD;  Location: [place];  Service: Open Heart Surgery;  Laterality: N/A;  LIMA to LAD, SVG to OM 1, SVG to PDA  . LEFT HEART CATH AND CORONARY ANGIOGRAPHY N/A 05/26/2017   Procedure: LEFT HEART CATH AND CORONARY ANGIOGRAPHY;  Surgeon: Belva Crome, MD;  Location: [place] [facility];  Service: Cardiovascular;  Laterality: N/A;  . LEFT HEART CATHETERIZATION WITH CORONARY ANGIOGRAM N/A 05/18/2012   Procedure: LEFT HEART CATHETERIZATION WITH CORONARY ANGIOGRAM;  Surgeon: Wellington Hampshire, MD;  Location: [place] CATH LAB;  Service: Cardiovascular;  Laterality: N/A;  . [place]; 2009  . TEE WITHOUT CARDIOVERSION N/A 05/26/2017   Procedure: TRANSESOPHAGEAL ECHOCARDIOGRAM (TEE);  Surgeon: [name], MD;  Location: [place];  Service: Open Heart Surgery;  Laterality: N/A;  . TOTAL HIP ARTHROPLASTY Right 05/24/2014   Procedure: RIGHT TOTAL HIP ARTHROPLASTY ANTERIOR APPROACH;  Surgeon: Gearlean Alf, MD;  Location: [facility];  Service: Orthopedics;  Laterality: Right;    Family History  Problem Relation Age of Onset  . Cancer Paternal Grandfather        Esophageal  . Coronary artery disease Mother   . Diabetes Mother   . Hypertension Mother   . Dementia Father   . Sudden death Brother        Suicide    Allergies  Allergen Reactions  . Oxycodone Hcl Itching and Other (See Comments)    Confusion, hallucinations  . Penicillins Itching and Rash    Has patient had a PCN reaction causing immediate rash, facial/tongue/throat swelling, SOB or lightheadedness with hypotension: yes rash that took a while to go away across the abdomen Has patient had a PCN reaction causing severe rash involving mucus membranes or skin necrosis: no Has patient had a PCN reaction that required hospitalization: no Has patient had a PCN reaction occurring within the last 10 years: No If all of the above answers are "NO", then may proceed with Cephalosporin use.   . Tramadol  Itching and Other (See Comments)    hallucinations  . Aspirin Nausea And Vomiting and Other (See Comments)    Burning in stomach- tolerates enteric coated  . Macrobid [Nitrofurantoin Monohyd Macro] Nausea And Vomiting  . Sulfa Antibiotics Nausea Only    Current Outpatient Medications on File Prior to Visit  Medication Sig Dispense Refill  . acetaminophen (TYLENOL) 325 MG tablet Take 2 tablets (650 mg total) by mouth every 6 (six) hours as needed for fever, headache,  mild pain or moderate pain.    Marland Kitchen albuterol (PROVENTIL HFA;VENTOLIN HFA) 108 (90 Base) MCG/ACT inhaler Inhale 2 puffs into the lungs every 4 (four) hours as needed for wheezing or shortness of breath. 1 Inhaler 0  . ALPRAZolam (XANAX) 0.25 MG tablet Take 1 tablet (0.25 mg total) by mouth 2 (two) times daily as needed for up to 30 doses for anxiety. 30 tablet 0  . atorvastatin (LIPITOR) 80 MG tablet Take 1 tablet (80 mg total) by mouth daily at 6 PM. Please make yearly appt with Dr. Tamala Julian for March for future refills. 1st attempt 30 tablet 2  . baclofen (LIORESAL) 10 MG tablet TAKE 1 TABLET BY MOUTH EVERYDAY AT BEDTIME (Patient taking differently: Take 10 mg by mouth at bedtime as needed for muscle spasms. ) 15 tablet 0  . clopidogrel (PLAVIX) 75 MG tablet Take 1 tablet (75 mg total) by mouth daily. Please make yearly appt with Dr. Tamala Julian for March for future refills. 1st attempt 30 tablet 1  . FLUoxetine (PROZAC) 20 MG capsule Take 60 mg by mouth daily.   1  . hydrOXYzine (ATARAX/VISTARIL) 25 MG tablet Take 25-50 mg by mouth at bedtime as needed for anxiety or itching.     . meclizine (ANTIVERT) 25 MG tablet Take 25 mg by mouth 2 (two) times daily as needed for dizziness.    . Multiple Vitamin (MULTIVITAMIN WITH MINERALS) TABS tablet Take 1 tablet by mouth at bedtime.     . pentosan polysulfate (ELMIRON) 100 MG capsule Take 200 mg by mouth 2 (two) times daily as needed (side pains).     . polyethylene glycol-electrolytes  (NULYTELY/GOLYTELY) 420 g solution Take 4,000 mLs by mouth as directed. 4000 mL 0  . Probiotic Product (PROBIOTIC PO) Take 1 capsule by mouth at bedtime. Nature Bounty's brand    . traZODone (DESYREL) 100 MG tablet Take 150 mg by mouth at bedtime.     . VENTOLIN HFA 108 (90 Base) MCG/ACT inhaler Inhale 2 puffs into the lungs every 6 (six) hours as needed for wheezing or shortness of breath.   0   No current facility-administered medications on file prior to visit.     BP 130/78   Temp 98 F (36.7 C)   Wt 167 lb (75.8 kg)   LMP  (LMP Unknown)   BMI 30.54 kg/m       Objective:   Physical Exam Vitals signs and nursing note reviewed.  Constitutional:      Appearance: Normal appearance.  HENT:     Nose: Congestion and rhinorrhea present. Rhinorrhea is clear.     Right Turbinates: Swollen.     Right Sinus: No maxillary sinus tenderness or frontal sinus tenderness.     Left Sinus: No maxillary sinus tenderness or frontal sinus tenderness.     Mouth/Throat:     Mouth: Mucous membranes are moist.     Pharynx: Oropharynx is clear. No oropharyngeal exudate.  Cardiovascular:     Rate and Rhythm: Normal rate and regular rhythm.     Pulses: Normal pulses.     Heart sounds: Normal heart sounds.  Pulmonary:     Effort: Pulmonary effort is normal.     Breath sounds: Normal breath sounds.  Skin:    General: Skin is warm and dry.     Capillary Refill: Capillary refill takes less than 2 seconds.  Neurological:     General: No focal deficit present.     Mental Status: She is alert and oriented  to person, place, and time.       Assessment & Plan:  1. Flu-like symptoms  - POC Influenza A&B(BINAX/QUICKVUE)- negative   2. Viral sinusitis -No signs of bacterial infection.  Advised Flonase or Claritin for symptom relief.  Follow-up if symptoms do not improve in the next 7 days or if fever develops.  [name], NP

## 2018-10-20 DIAGNOSIS — M79641 Pain in right hand: Secondary | ICD-10-CM | POA: Diagnosis not present

## 2018-10-20 DIAGNOSIS — G5601 Carpal tunnel syndrome, right upper limb: Secondary | ICD-10-CM | POA: Diagnosis not present

## 2018-10-27 ENCOUNTER — Emergency Department (HOSPITAL_COMMUNITY)
Admission: EM | Admit: 2018-10-27 | Discharge: 2018-10-27 | Disposition: A | Discharge status: Home / Self Care | Payer: BLUE CROSS/BLUE SHIELD | Attending: Emergency Medicine | Admitting: Emergency Medicine

## 2018-10-27 ENCOUNTER — Emergency Department (HOSPITAL_COMMUNITY): Payer: BLUE CROSS/BLUE SHIELD

## 2018-10-27 ENCOUNTER — Encounter (HOSPITAL_COMMUNITY): Payer: Self-pay

## 2018-10-27 ENCOUNTER — Other Ambulatory Visit: Payer: Self-pay

## 2018-10-27 ENCOUNTER — Telehealth: Payer: Self-pay | Admitting: Interventional Cardiology

## 2018-10-27 DIAGNOSIS — Z96641 Presence of right artificial hip joint: Secondary | ICD-10-CM | POA: Diagnosis not present

## 2018-10-27 DIAGNOSIS — Z79899 Other long term (current) drug therapy: Secondary | ICD-10-CM | POA: Insufficient documentation

## 2018-10-27 DIAGNOSIS — Z951 Presence of aortocoronary bypass graft: Secondary | ICD-10-CM | POA: Insufficient documentation

## 2018-10-27 DIAGNOSIS — I2510 Atherosclerotic heart disease of native coronary artery without angina pectoris: Secondary | ICD-10-CM | POA: Insufficient documentation

## 2018-10-27 DIAGNOSIS — R079 Chest pain, unspecified: Secondary | ICD-10-CM

## 2018-10-27 DIAGNOSIS — Z87891 Personal history of nicotine dependence: Secondary | ICD-10-CM | POA: Insufficient documentation

## 2018-10-27 DIAGNOSIS — R05 Cough: Secondary | ICD-10-CM | POA: Diagnosis not present

## 2018-10-27 LAB — CBC
HEMATOCRIT: 36.5 % (ref 36.0–46.0)
HEMOGLOBIN: 11.9 g/dL — AB (ref 12.0–15.0)
MCH: 30.3 pg (ref 26.0–34.0)
MCHC: 32.6 g/dL (ref 30.0–36.0)
MCV: 92.9 fL (ref 80.0–100.0)
Platelets: 192 10*3/uL (ref 150–400)
RBC: 3.93 MIL/uL (ref 3.87–5.11)
RDW: 11.6 % (ref 11.5–15.5)
WBC: 6.3 10*3/uL (ref 4.0–10.5)
nRBC: 0 % (ref 0.0–0.2)

## 2018-10-27 LAB — BASIC METABOLIC PANEL
Anion gap: 7 (ref 5–15)
BUN: 6 mg/dL — ABNORMAL LOW (ref 8–23)
CHLORIDE: 108 mmol/L (ref 98–111)
CO2: 20 mmol/L — ABNORMAL LOW (ref 22–32)
Calcium: 9.2 mg/dL (ref 8.9–10.3)
Creatinine, Ser: 0.86 mg/dL (ref 0.44–1.00)
GFR calc non Af Amer: >60 mL/min (ref >60)
Glucose, Bld: 115 mg/dL — ABNORMAL HIGH (ref 70–99)
POTASSIUM: 3.8 mmol/L (ref 3.5–5.1)
SODIUM: 135 mmol/L (ref 135–145)

## 2018-10-27 LAB — TROPONIN I: Troponin I: <0.03 ng/mL (ref <0.03)

## 2018-10-27 MED ORDER — SODIUM CHLORIDE 0.9% FLUSH: 3.0000 mL | Freq: Once | INTRAVENOUS | Status: DC

## 2018-10-27 NOTE — ED Notes (Signed)
Main lab to add on troponin

## 2018-10-27 NOTE — Telephone Encounter (Signed)
Pt states about a week ago she felt like she had a MI.  States she was having CP and jaw pain just like she did with her previous MI.  Denies diaphoresis or SOB.  Took one nitro with minimal relief, took a second one and everything stopped.  She's been having issues with reflux and thought maybe it was just that.  No vitals because she has a new cuff and doesn't know how to use it.  States since this episode she has felt tightness in her chest that is kind of constant, never fully goes away.  States all of the tightness feels like it is in the top of her scar where her open heart surgery was done.  Recently had a cold but it has resolved.  Advised I would speak with Dr. Tamala Julian and call her back. Spoke with Dr. Tamala Julian and he advised pt proceed to ED for eval as there is concern that her grafts may have closed off.  Spoke with pt and advised her of recommendations.  Pt verbalized understanding and was appreciative for call.

## 2018-10-27 NOTE — Discharge Instructions (Signed)
It was my pleasure taking care of you today!   Please call your cardiologist in the morning to schedule a follow up appointment.  I have sent Dr. Tamala Julian a message to let him know about your visit today.  Return to ER for new or worsening symptoms, any additional concerns.

## 2018-10-27 NOTE — Telephone Encounter (Signed)
STAT if patient feels like he/she is going to faint   1) Are you dizzy now? No, it comes and goes  2) Do you feel faint or have you passed out?   3) Do you have any other symptoms? SOB, trouble catching breath  4) Have you checked your HR and BP (record if available)? Doesn't have record. Having a hard time figuring out BP cuff   Pt is getting over a cold, and is having a general tightness in her chest, around incision scar from open heart surgery. Wonders if it is heart related.  I mentioned trying to get an appt with a PCP, but she is certain her issue is chest related

## 2018-10-27 NOTE — ED Provider Notes (Signed)
[place] EMERGENCY DEPARTMENT Provider Note   CSN: [id number] Arrival date & time: 10/27/18  1638    History   Chief Complaint Chief Complaint  Patient presents with  . Chest Pain    HPI AANCHAL COPE is a 73 y.o. female.     The history is provided by the patient and medical records. No language interpreter was used.  Chest Pain  Associated symptoms: cough   Associated symptoms: no palpitations and no shortness of breath    Tieisha D Kovack is a 73 y.o. female  with a PMH of CAD s/p CABG followed by Dr. Tamala Julian who presents to the Emergency Department complaining of central chest tightness which began about 2-3 nights ago.  No radiation of her pain.  Feels different than her cardiac pain in the past.  She did take nitro with minimal relief, therefore took another 1 and her pain resolved.  Does not have any nausea or vomiting.  Has had a cough and nasal congestion over the last few days as well.  No shortness of breath.  No fevers.  She reports that the pain has been constant, but will sometimes intensify and improved.  She cannot think of any specific aggravating factors.   Past Medical History:  Diagnosis Date  . Arthritis   . Blood transfusion without reported diagnosis   . CAD (coronary artery disease)    a. 1997 MI/PCI RCA;  b. 2007 MI/PCI of 100% RCA with Taxus DES x 3 placed, EF 55%;  c. 2010 Cath: stable anatomy;  d. 05/2012 NSTEMI/Cath/PCI: LM nl, LAD 60-75m, D1 20ost, LCX 59m, RCA 40-33m ISR, 60/95d (Treated w/ 2.75x33 Xience Xpedition DES), PDA 70m (Treated w/ PTCA), EF 60%.  . Carpal tunnel syndrome on both sides   . Chronic lower back pain   . Depression   . GERD (gastroesophageal reflux disease)   . Hyperlipidemia   . Myocardial infarction [facility]) 1997, 2007, 2013  . Numbness of foot    Left foot - back surgery 2009    Patient Active Problem List   Diagnosis Date Noted  . Coronary artery disease involving coronary bypass graft of native  heart with angina pectoris ([place]) 08/28/2017  . Postoperative atrial fibrillation ([place]) 08/28/2017  . RAD (reactive airway disease) 04/01/2016  . B12 deficiency 07/11/2015  . Anemia associated with acute blood loss 05/28/2014  . Cigarette smoker 05/28/2014  . OA (osteoarthritis) of hip 05/24/2014  . Allergic rhinitis due to pollen 04/01/2012  . Back pain 11/22/2010  . NEOPLASM OF UNCERTAIN BEHAVIOR OF SKIN 06/05/2010  . Swallowing difficulty 06/04/2010  . GERD 07/30/2009  . Hyperlipidemia with target LDL less than 70 03/02/2007  . Depression 03/02/2007    Past Surgical History:  Procedure Laterality Date  . ANKLE SURGERY  Years ago   Left; "had to take out a floater"  . CARPAL TUNNEL RELEASE Left 10/12016  . CESAREAN SECTION  1990  . CORONARY ANGIOPLASTY WITH STENT PLACEMENT  1997; 2007, 2013   "2 + 2; + 1= total of 5  . CORONARY ARTERY BYPASS GRAFT N/A 05/26/2017   Procedure: CORONARY ARTERY BYPASS GRAFTING (CABG), ON PUMP, TIMES Three, using left internal mammary artery and right greater saphenous vein harvested endoscopically;  Surgeon: [name], MD;  Location: [place];  Service: Open Heart Surgery;  Laterality: N/A;  LIMA to LAD, SVG to OM 1, SVG to PDA  . LEFT HEART CATH AND CORONARY ANGIOGRAPHY N/A 05/26/2017   Procedure: LEFT HEART  CATH AND CORONARY ANGIOGRAPHY;  Surgeon: Belva Crome, MD;  Location: [place] CV LAB;  Service: Cardiovascular;  Laterality: N/A;  . LEFT HEART CATHETERIZATION WITH CORONARY ANGIOGRAM N/A 05/18/2012   Procedure: LEFT HEART CATHETERIZATION WITH CORONARY ANGIOGRAM;  Surgeon: Wellington Hampshire, MD;  Location: [place] CATH LAB;  Service: Cardiovascular;  Laterality: N/A;  . [place]; 2009  . TEE WITHOUT CARDIOVERSION N/A 05/26/2017   Procedure: TRANSESOPHAGEAL ECHOCARDIOGRAM (TEE);  Surgeon: [name], MD;  Location: [place];  Service: Open Heart Surgery;  Laterality: N/A;  . TOTAL HIP ARTHROPLASTY Right 05/24/2014    Procedure: RIGHT TOTAL HIP ARTHROPLASTY ANTERIOR APPROACH;  Surgeon: Gearlean Alf, MD;  [facility];  Service: Orthopedics;  Laterality: Right;     OB History    Gravida  2   Para  2   Term      Preterm      AB      Living  2     SAB      TAB      Ectopic      Multiple      Live Births               Home Medications    Prior to Admission medications   Medication Sig Start Date End Date Taking? Authorizing Provider  acetaminophen (TYLENOL) 325 MG tablet Take 2 tablets (650 mg total) by mouth every 6 (six) hours as needed for fever, headache, mild pain or moderate pain. 06/04/17   Nani Skillern, PA-C  albuterol (PROVENTIL HFA;VENTOLIN HFA) 108 (90 Base) MCG/ACT inhaler Inhale 2 puffs into the lungs every 4 (four) hours as needed for wheezing or shortness of breath. 06/29/18   Joy, Shawn C, PA-C  ALPRAZolam (XANAX) 0.25 MG tablet Take 1 tablet (0.25 mg total) by mouth 2 (two) times daily as needed for up to 30 doses for anxiety. 06/09/18   Nafziger, Tommi Rumps, NP  atorvastatin (LIPITOR) 80 MG tablet Take 1 tablet (80 mg total) by mouth daily at 6 PM. Please make yearly appt with Dr. Tamala Julian for March for future refills. 1st attempt 08/10/18   Richardson Dopp T, PA-C  baclofen (LIORESAL) 10 MG tablet TAKE 1 TABLET BY MOUTH EVERYDAY AT BEDTIME Patient taking differently: Take 10 mg by mouth at bedtime as needed for muscle spasms.  06/09/18   Nafziger, Tommi Rumps, NP  clopidogrel (PLAVIX) 75 MG tablet Take 1 tablet (75 mg total) by mouth daily. Please make yearly appt with Dr. Tamala Julian for March for future refills. 1st attempt 09/08/18   Belva Crome, MD  FLUoxetine (PROZAC) 20 MG capsule Take 60 mg by mouth daily.  03/03/18   [provider]  hydrOXYzine (ATARAX/VISTARIL) 25 MG tablet Take 25-50 mg by mouth at bedtime as needed for anxiety or itching.     [provider]  meclizine (ANTIVERT) 25 MG tablet Take 25 mg by mouth 2 (two) times daily as needed for  dizziness.    [provider]  Multiple Vitamin (MULTIVITAMIN WITH MINERALS) TABS tablet Take 1 tablet by mouth at bedtime.     [provider]  pentosan polysulfate (ELMIRON) 100 MG capsule Take 200 mg by mouth 2 (two) times daily as needed (side pains).  04/09/17   [provider]  polyethylene glycol-electrolytes (NULYTELY/GOLYTELY) 420 g solution Take 4,000 mLs by mouth as directed. 03/02/18   Milus Banister, MD  Probiotic Product (PROBIOTIC PO) Take 1 capsule by mouth  at bedtime. Nature Bounty's brand    [provider]  traZODone (DESYREL) 100 MG tablet Take 150 mg by mouth at bedtime.     [provider]  VENTOLIN HFA 108 (90 Base) MCG/ACT inhaler Inhale 2 puffs into the lungs every 6 (six) hours as needed for wheezing or shortness of breath.  06/20/18   [provider]    Family History Family History  Problem Relation Age of Onset  . Cancer Paternal Grandfather        Esophageal  . Coronary artery disease Mother   . Diabetes Mother   . Hypertension Mother   . Dementia Father   . Sudden death Brother        Suicide    Social History Social History   Tobacco Use  . Smoking status: Former Smoker    Packs/day: 0.50    Years: 50.00    Pack years: 25.00    Types: Cigarettes    Last attempt to quit: 06/01/2017    Years since quitting: 1.4  . Smokeless tobacco: Never Used  Substance Use Topics  . Alcohol use: No    Alcohol/week: 0.0 standard drinks  . Drug use: No     Allergies   Oxycodone hcl; Penicillins; Tramadol; Aspirin; Macrobid [nitrofurantoin monohyd macro]; and Sulfa antibiotics   Review of Systems Review of Systems  HENT: Positive for congestion.   Respiratory: Positive for cough. Negative for shortness of breath.   Cardiovascular: Positive for chest pain. Negative for palpitations and leg swelling.  All other systems reviewed and are negative.    Physical Exam Updated Vital Signs BP 119/81    Pulse (!) 59   Temp 98.5 F (36.9 C) (Oral)   Resp 12   LMP  (LMP Unknown)   SpO2 100%   Physical Exam Vitals signs and nursing note reviewed.  Constitutional:      General: She is not in acute distress.    Appearance: She is well-developed.  HENT:     Head: Normocephalic and atraumatic.  Neck:     Musculoskeletal: Neck supple.  Cardiovascular:     Rate and Rhythm: Normal rate and regular rhythm.     Heart sounds: Normal heart sounds. No murmur.  Pulmonary:     Effort: Pulmonary effort is normal. No respiratory distress.     Breath sounds: Normal breath sounds.  Abdominal:     General: There is no distension.     Palpations: Abdomen is soft.     Tenderness: There is no abdominal tenderness.  Musculoskeletal:     Right lower leg: She exhibits no tenderness. No edema.     Left lower leg: She exhibits no tenderness. No edema.  Skin:    General: Skin is warm and dry.  Neurological:     Mental Status: She is alert and oriented to person, place, and time.      ED Treatments / Results  Labs (all labs ordered are listed, but only abnormal results are displayed) Labs Reviewed  BASIC METABOLIC PANEL - Abnormal; Notable for the following components:      Result Value   CO2 20 (*)    Glucose, Bld 115 (*)    BUN 6 (*)    All other components within normal limits  CBC - Abnormal; Notable for the following components:   Hemoglobin 11.9 (*)    All other components within normal limits  TROPONIN I  I-STAT TROPONIN, ED    EKG EKG Interpretation  Date/Time:  Wednesday  October 27 2018 16:49:39 EDT Ventricular Rate:  73 PR Interval:  162 QRS Duration: 80 QT Interval:  378 QTC Calculation: 416 R Axis:   63 Text Interpretation:  Normal sinus rhythm with sinus arrhythmia Normal ECG Confirmed by Davonna Belling [id number]) on 10/27/2018 8:46:25 PM   Radiology Dg Chest 2 View  Result Date: 10/27/2018 CLINICAL DATA:  Initial evaluation for acute chest pain. EXAM: CHEST - 2 VIEW  COMPARISON:  Prior radiograph from 06/29/2018 FINDINGS: Median sternotomy wires with underlying CABG markers noted. Cardiac and mediastinal silhouettes are stable, and remain within normal limits. Lungs normally inflated. No focal infiltrates, pulmonary edema, or pleural effusion. Mild left basilar subsegmental atelectasis. No pneumothorax. No acute osseous abnormality. IMPRESSION: 1. Mild left basilar subsegmental atelectasis. 2. No other active cardiopulmonary disease. 3. Sequelae of prior CABG. Electronically Signed   By: Jeannine Boga M.D.   On: 10/27/2018 17:42    Procedures Procedures (including critical care time)  Medications Ordered in ED Medications  sodium chloride flush (NS) 0.9 % injection 3 mL (has no administration in time range)     Initial Impression / Assessment and Plan / ED Course  I have reviewed the triage vital signs and the nursing notes.  Pertinent labs & imaging results that were available during my care of the patient were reviewed by me and considered in my medical decision making (see chart for details).       ALYNN ELLITHORPE is a 73 y.o. female with history of CAD status post CABG who presents to ED for central chest pain for the last 2 to 3 days.  Did improve with nitro 2 days ago.  She has not tried any nitro since then.  She reports pain is constant, but does wax and wane in intensity.  On exam, patient is afebrile, hemodynamically stable with normal cardiopulmonary examination.  EKG without acute ischemic changes.  Troponin negative.  Chest x-ray with mild left atelectasis, but otherwise no acute processes.  No pneumonia.  Recommended cardiology consultation with possible admission for observation depending on the recommendations of cards team with patient, however she states that she does not want to stay in the hospital any longer as she is worried about coronavirus exposure.  We discussed the risks of leaving without further evaluation.  She still  prefers outpatient follow-up.  Encouraged her to call in the morning.  Reasons to return to the emergency department were discussed and all questions were answered.  Patient discussed with Dr. Alvino Chapel who agrees with treatment plan.    Final Clinical Impressions(s) / ED Diagnoses   Final diagnoses:  Chest pain, unspecified type    ED Discharge Orders    None       Ward, Ozella Almond, PA-C 10/27/18 2107    Davonna Belling, MD 10/27/18 [phone]

## 2018-10-27 NOTE — Telephone Encounter (Signed)
New Message   Patient states she has a conversation with Maude Leriche earlier and wants to speak with her again to get some clarity.

## 2018-10-27 NOTE — Telephone Encounter (Signed)
Pt currently in ER

## 2018-10-27 NOTE — ED Triage Notes (Signed)
Pt reports chest pain 3 days ago, pt took 2 nitros the first night and it relieved her pain, pt called her cardiologist office today and they told her to come here for evaluation that maybe "a plate" from her open heart surgery last year had moved. Pt a.o, nad noted.

## 2018-10-31 ENCOUNTER — Other Ambulatory Visit: Payer: Self-pay | Admitting: Interventional Cardiology

## 2018-10-31 NOTE — Progress Notes (Signed)
Cardiology Office Note:    Date:  11/01/2018   ID:  KENNEDE LUSK, DOB [birth year], MRN [id number]  PCP:  [name], NP  Cardiologist:  Sinclair Grooms, MD   Referring MD: [name], NP   Chief Complaint  Patient presents with   Coronary Artery Disease   Chest Pain    History of Present Illness:    Bianca [name] is a 73 y.o. female with a hx of  coronary artery disease, prior inferior MI 1997, prior right coronary drug-eluting stents 2007, non-ST elevation MI 2018, and ultimate coronary bypass grafting with LIMA to LAD, SVG to obtuse marginal, and SVG to PDA October 2018.Amiodarone therapy was used for postoperative atrial fibrillation. Significant chronic pain problem related to her back. Recent recurrent chest pain c/w angina.  Malachy Mood developed chest discomfort on 10/27/2018.  She eventually went to the emergency room because of this comfort.  It was described as pressure in the precordial area.  EKG in the emergency room was unremarkable.  Chest discomfort had been present for 48 to 72 hours prior to emergency room evaluation.  States was different than her cardiac discomfort.  Nitroglycerin did not help.  However, 24 hours prior to the pain that led to the emergency room, she had bilateral jaw discomfort and chest discomfort that was relieved with 1 sublingual nitroglycerin after approximately 5 minutes.  That episode of discomfort awaken her from sleep.  She is ambulatory today.  No exercise-induced discomfort in the chest.  States that there is still some sternal discomfort that is slightly more intense than prior and has always been present since the time of surgery, which she believes is related to scarring/healing.  Her nitroglycerin tablets are outdated.  Past Medical History:  Diagnosis Date   Arthritis    Blood transfusion without reported diagnosis    CAD (coronary artery disease)    a. 1997 MI/PCI RCA;  b. 2007 MI/PCI of 100% RCA with Taxus DES x 3  placed, EF 55%;  c. 2010 Cath: stable anatomy;  d. 05/2012 NSTEMI/Cath/PCI: LM nl, LAD 60-25m, D1 20ost, LCX 38m, RCA 40-21m ISR, 60/95d (Treated w/ 2.75x33 Xience Xpedition DES), PDA 6m (Treated w/ PTCA), EF 60%.   Carpal tunnel syndrome on both sides    Chronic lower back pain    Depression    GERD (gastroesophageal reflux disease)    Hyperlipidemia    Myocardial infarction ([place]) 1997, 2007, 2013   Numbness of foot    Left foot - back surgery 2009    Past Surgical History:  Procedure Laterality Date   ANKLE SURGERY  Years ago   Left; "had to take out a floater"   CARPAL TUNNEL RELEASE Left 10/12016   [place]; 2007, 2013   "2 + 2; + 1= total of 5   CORONARY ARTERY BYPASS GRAFT N/A 05/26/2017   Procedure: CORONARY ARTERY BYPASS GRAFTING (CABG), ON PUMP, TIMES Three, using left internal mammary artery and right greater saphenous vein harvested endoscopically;  Surgeon: [name], MD;  Location: [place];  Service: Open Heart Surgery;  Laterality: N/A;  LIMA to LAD, SVG to OM 1, SVG to PDA   LEFT HEART CATH AND CORONARY ANGIOGRAPHY N/A 05/26/2017   Procedure: LEFT HEART CATH AND CORONARY ANGIOGRAPHY;  Surgeon: Belva Crome, MD;  Location: [place] [facility];  Service: Cardiovascular;  Laterality: N/A;   LEFT HEART CATHETERIZATION WITH CORONARY ANGIOGRAM N/A  05/18/2012   Procedure: LEFT HEART CATHETERIZATION WITH CORONARY ANGIOGRAM;  Surgeon: Wellington Hampshire, MD;  Location: [place] CATH LAB;  Service: Cardiovascular;  Laterality: N/A;   [place]; 2009   TEE WITHOUT CARDIOVERSION N/A 05/26/2017   Procedure: TRANSESOPHAGEAL ECHOCARDIOGRAM (TEE);  Surgeon: [name], MD;  Location: [place];  Service: Open Heart Surgery;  Laterality: N/A;   TOTAL HIP ARTHROPLASTY Right 05/24/2014   Procedure: RIGHT TOTAL HIP ARTHROPLASTY ANTERIOR APPROACH;  Surgeon: Gearlean Alf, MD;  Location:  [facility];  Service: Orthopedics;  Laterality: Right;    Current Medications: Current Meds  Medication Sig   acetaminophen (TYLENOL) 325 MG tablet Take 2 tablets (650 mg total) by mouth every 6 (six) hours as needed for fever, headache, mild pain or moderate pain.   atorvastatin (LIPITOR) 80 MG tablet Take 1 tablet (80 mg total) by mouth daily at 6 PM. Please make yearly appt with Dr. Tamala Julian for March for future refills. 1st attempt   diphenhydrAMINE (BENADRYL) 25 MG tablet Take 50 mg by mouth every 6 (six) hours as needed for allergies.   DULoxetine (CYMBALTA) 60 MG capsule Take 60 mg by mouth daily.   gabapentin (NEURONTIN) 300 MG capsule Take 300 mg by mouth 2 (two) times daily.   Multiple Vitamin (MULTIVITAMIN WITH MINERALS) TABS tablet Take 1 tablet by mouth at bedtime.    pentosan polysulfate (ELMIRON) 100 MG capsule Take 200 mg by mouth 2 (two) times daily as needed (side pains).    Probiotic Product (PROBIOTIC PO) Take 1 capsule by mouth at bedtime. Nature [employer]'s brand   traZODone (DESYREL) 100 MG tablet Take 150 mg by mouth at bedtime.    [DISCONTINUED] clopidogrel (PLAVIX) 75 MG tablet Take 1 tablet (75 mg total) by mouth daily. Please make yearly appt with Dr. Tamala Julian for March for future refills. 1st attempt     Allergies:   Oxycodone hcl; Penicillins; Tramadol; Aspirin; Macrobid [nitrofurantoin monohyd macro]; and Sulfa antibiotics   Social History   Socioeconomic History   Marital status: Married    Spouse name: Not on file   Number of children: Not on file   Years of education: Not on file   Highest education level: Not on file  Occupational History   Not on file  Social Needs   Financial resource strain: Not on file   Food insecurity:    Worry: Not on file    Inability: Not on file   Transportation needs:    Medical: Not on file    Non-medical: Not on file  Tobacco Use   Smoking status: Former Smoker    Packs/day: 0.50    Years: 50.00    Pack  years: 25.00    Types: Cigarettes    Last attempt to quit: 06/01/2017    Years since quitting: 1.4   Smokeless tobacco: Never Used  Substance and Sexual Activity   Alcohol use: No    Alcohol/week: 0.0 standard drinks   Drug use: No   Sexual activity: Not Currently    Birth control/protection: Post-menopausal  Lifestyle   Physical activity:    Days per week: Not on file    Minutes per session: Not on file   Stress: Not on file  Relationships   Social connections:    Talks on phone: Not on file    Gets together: Not on file    Attends religious service: Not on file    Active member of club or organization: Not on  file    Attends meetings of clubs or organizations: Not on file    Relationship status: Not on file  Other Topics Concern   Not on file  Social History Narrative   Is a homemaker   Married for 29 years    Has a daughter and son    Pets: Two dogs and a Neurosurgeon   Likes to garden ( flower beds).         Family History: The patient's family history includes Cancer in her paternal grandfather; Coronary artery disease in her mother; Dementia in her father; Diabetes in her mother; Hypertension in her mother; Sudden death in her brother.  ROS:   Please see the history of present illness.    Chest pain has not recurred.  She has chronic back pain and shortness of breath with activity.  All other systems reviewed and are negative.  EKGs/Labs/Other Studies Reviewed:    The following studies were reviewed today: Chest x-ray 10/27/2018: IMPRESSION: 1. Mild left basilar subsegmental atelectasis. 2. No other active cardiopulmonary disease. 3. Sequelae of prior CABG. EKG:  EKG sinus rhythm, premature ventricular contraction, otherwise normal.  When compared to the prior EKG from the emergency room visit on 10/29/2018, the PVC is new.  Recent Labs: 10/27/2018: BUN 6; Creatinine, Ser 0.86; Hemoglobin 11.9; Platelets 192; Potassium 3.8; Sodium 135  Recent Lipid Panel      Component Value Date/Time   CHOL 233 (H) 10/31/2016 0915   TRIG 316.0 (H) 10/31/2016 0915   HDL 48.80 10/31/2016 0915   CHOLHDL 5 10/31/2016 0915   VLDL 63.2 (H) 10/31/2016 0915   LDLCALC 106 (H) 01/03/2014 0939   LDLDIRECT 129.0 10/31/2016 0915    Physical Exam:    VS:  Ht 5\' 2"  (1.575 m)    LMP  (LMP Unknown)    BMI 30.54 kg/m     Wt Readings from Last 3 Encounters:  10/05/18 167 lb (75.8 kg)  09/29/18 171 lb 12.8 oz (77.9 kg)  07/06/18 164 lb (74.4 kg)     GEN: Pale appearing. No acute distress HEENT: Normal NECK: No JVD. LYMPHATICS: No lymphadenopathy CARDIAC: RRR.  No murmur, no gallop, no edema VASCULAR: 2+ bilateral radial pulses, no bruits RESPIRATORY:  Clear to auscultation without rales, wheezing or rhonchi  ABDOMEN: Soft, non-tender, non-distended, No pulsatile mass, MUSCULOSKELETAL: No deformity  SKIN: Warm and dry NEUROLOGIC:  Alert and oriented x 3 PSYCHIATRIC:  Normal affect   ASSESSMENT:    1. Coronary artery disease involving coronary bypass graft of native heart with angina pectoris ([place])   2. Cigarette smoker   3. Hyperlipidemia with target LDL less than 70    PLAN:    In order of problems listed above:  1. No evidence of myocardial infarction or ischemia based on emergency room work-up and follow-up here in the office.  Secondary risk factor modification discussed. 2. No longer a smoker. 3. Lipids are unknown.  Liver and lipid panel needs to be performed.  This was recommended but she deferred preferring to have it done by PCP.  Moderate activity encouraged.  Clinical follow-up in 9 months.  New prescription for nitroglycerin is given.   Medication Adjustments/Labs and Tests Ordered: Current medicines are reviewed at length with the patient today.  Concerns regarding medicines are outlined above.  Orders Placed This Encounter  Procedures   EKG 12-Lead   Meds ordered this encounter  Medications   nitroGLYCERIN (NITROSTAT) 0.4 MG SL  tablet    Sig: Place 1  tablet (0.4 mg total) under the tongue every 5 (five) minutes as needed for chest pain.    Dispense:  50 tablet    Refill:  3    Pt requesting to get 2 bottles    Patient Instructions  Medication Instructions:  Your physician recommends that you continue on your current medications as directed. Please refer to the Current Medication list given to you today.  If you need a refill on your cardiac medications before your next appointment, please call your pharmacy.   Lab work: Please have a Lipid and liver drawn at your Primary Care or come back to this office to have it drawn.   If you have labs (blood work) drawn today and your tests are completely normal, you will receive your results only by:  [place] (if you have MyChart) OR  A paper copy in the mail If you have any lab test that is abnormal or we need to change your treatment, we will call you to review the results.  Testing/Procedures: None  Follow-Up: At [facility], you and your health needs are our priority.  As part of our continuing mission to provide you with exceptional heart care, we have created designated Provider Care Teams.  These Care Teams include your primary Cardiologist (physician) and Advanced Practice Providers (APPs -  Physician Assistants and Nurse Practitioners) who all work together to provide you with the care you need, when you need it. You will need a follow up appointment in 9-12 months.  Please call our office 2 months in advance to schedule this appointment.  You may see Sinclair Grooms, MD or one of the following Advanced Practice Providers on your designated Care Team:   Truitt Merle, NP Cecilie Kicks, NP  Kathyrn Drown, NP  Any Other Special Instructions Will Be Listed Below (If Applicable).       Signed, Sinclair Grooms, MD  11/01/2018 11:36 AM    [place]

## 2018-11-01 ENCOUNTER — Encounter (INDEPENDENT_AMBULATORY_CARE_PROVIDER_SITE_OTHER): Payer: Self-pay

## 2018-11-01 ENCOUNTER — Other Ambulatory Visit: Payer: Self-pay

## 2018-11-01 ENCOUNTER — Ambulatory Visit: Payer: BLUE CROSS/BLUE SHIELD | Admitting: Interventional Cardiology

## 2018-11-01 ENCOUNTER — Encounter: Payer: Self-pay | Admitting: Interventional Cardiology

## 2018-11-01 VITALS — Ht 62.0 in

## 2018-11-01 DIAGNOSIS — I25709 Atherosclerosis of coronary artery bypass graft(s), unspecified, with unspecified angina pectoris: Secondary | ICD-10-CM

## 2018-11-01 DIAGNOSIS — F17210 Nicotine dependence, cigarettes, uncomplicated: Secondary | ICD-10-CM | POA: Diagnosis not present

## 2018-11-01 DIAGNOSIS — E785 Hyperlipidemia, unspecified: Secondary | ICD-10-CM | POA: Diagnosis not present

## 2018-11-01 MED ORDER — NITROGLYCERIN 0.4 MG SL SUBL: 0.4000 mg | SUBLINGUAL_TABLET | SUBLINGUAL | 3 refills | Status: AC | PRN

## 2018-11-01 NOTE — Patient Instructions (Signed)
Medication Instructions:  Your physician recommends that you continue on your current medications as directed. Please refer to the Current Medication list given to you today.  If you need a refill on your cardiac medications before your next appointment, please call your pharmacy.   Lab work: Please have a Lipid and liver drawn at your Primary Care or come back to this office to have it drawn.   If you have labs (blood work) drawn today and your tests are completely normal, you will receive your results only by: Marland Kitchen MyChart Message (if you have MyChart) OR . A paper copy in the mail If you have any lab test that is abnormal or we need to change your treatment, we will call you to review the results.  Testing/Procedures: None  Follow-Up: At [facility], you and your health needs are our priority.  As part of our continuing mission to provide you with exceptional heart care, we have created designated Provider Care Teams.  These Care Teams include your primary Cardiologist (physician) and Advanced Practice Providers (APPs -  Physician Assistants and Nurse Practitioners) who all work together to provide you with the care you need, when you need it. You will need a follow up appointment in 9-12 months.  Please call our office 2 months in advance to schedule this appointment.  You may see Sinclair Grooms, MD or one of the following Advanced Practice Providers on your designated Care Team:   Truitt Merle, NP Cecilie Kicks, NP . Kathyrn Drown, NP  Any Other Special Instructions Will Be Listed Below (If Applicable).

## 2018-11-02 ENCOUNTER — Telehealth: Payer: Self-pay | Admitting: *Deleted

## 2018-11-02 NOTE — Telephone Encounter (Signed)
Covid-19 travel screening questions  Have you traveled in the last 14 days? No If yes where?  Do you now or have you had a fever in the last 14 days? No  Do you have any respiratory symptoms of shortness of breath or cough now or in the last 14 days? No  Do you have any family members or close contacts with diagnosed or suspected Covid-19? No       

## 2018-11-03 ENCOUNTER — Other Ambulatory Visit: Payer: Self-pay

## 2018-11-03 ENCOUNTER — Ambulatory Visit (AMBULATORY_SURGERY_CENTER): Payer: BLUE CROSS/BLUE SHIELD | Admitting: Gastroenterology

## 2018-11-03 ENCOUNTER — Encounter: Payer: Self-pay | Admitting: Gastroenterology

## 2018-11-03 VITALS — BP 127/61 | HR 58 | Temp 97.5°F | Resp 13 | Ht 62.0 in | Wt 171.0 lb

## 2018-11-03 DIAGNOSIS — R194 Change in bowel habit: Secondary | ICD-10-CM

## 2018-11-03 MED ORDER — SODIUM CHLORIDE 0.9 % IV SOLN: 500.0000 mL | Freq: Once | INTRAVENOUS | Status: DC

## 2018-11-03 NOTE — Patient Instructions (Signed)
May start plavix today.  OU HAD AN ENDOSCOPIC PROCEDURE TODAY AT [facility]:   Refer to the procedure report that was given to you for any specific questions about what was found during the examination.  If the procedure report does not answer your questions, please call your gastroenterologist to clarify.  If you requested that your care partner not be given the details of your procedure findings, then the procedure report has been included in a sealed envelope for you to review at your convenience later.  YOU SHOULD EXPECT: Some feelings of bloating in the abdomen. Passage of more gas than usual.  Walking can help get rid of the air that was put into your GI tract during the procedure and reduce the bloating. If you had a lower endoscopy (such as a colonoscopy or flexible sigmoidoscopy) you may notice spotting of blood in your stool or on the toilet paper. If you underwent a bowel prep for your procedure, you may not have a normal bowel movement for a few days.  Please Note:  You might notice some irritation and congestion in your nose or some drainage.  This is from the oxygen used during your procedure.  There is no need for concern and it should clear up in a day or so.  SYMPTOMS TO REPORT IMMEDIATELY:   Following lower endoscopy (colonoscopy or flexible sigmoidoscopy):  Excessive amounts of blood in the stool  Significant tenderness or worsening of abdominal pains  Swelling of the abdomen that is new, acute  Fever of 100F or higher  For urgent or emergent issues, a gastroenterologist can be reached at any hour by calling [phone].   DIET:  We do recommend a small meal at first, but then you may proceed to your regular diet.  Drink plenty of fluids but you should avoid alcoholic beverages for 24 hours.  ACTIVITY:  You should plan to take it easy for the rest of today and you should NOT DRIVE or use heavy machinery until tomorrow (because of the sedation medicines  used during the test).    FOLLOW UP: Our staff will call the number listed on your records the next business day following your procedure to check on you and address any questions or concerns that you may have regarding the information given to you following your procedure. If we do not reach you, we will leave a message.  However, if you are feeling well and you are not experiencing any problems, there is no need to return our call.  We will assume that you have returned to your regular daily activities without incident.  If any biopsies were taken you will be contacted by phone or by letter within the next 1-3 weeks.  Please call us at [phone] if you have not heard about the biopsies in 3 weeks.    SIGNATURES/CONFIDENTIALITY: You and/or your care partner have signed paperwork which will be entered into your electronic medical record.  These signatures attest to the fact that that the information above on your After Visit Summary has been reviewed and is understood.  Full responsibility of the confidentiality of this discharge information lies with you and/or your care-partner.

## 2018-11-03 NOTE — Op Note (Signed)
[facility] Patient Name: Bianca [name] Procedure Date: 11/03/2018 11:20 AM MRN: [id number] Endoscopist: Milus Banister , MD Age: 73 Referring MD:  Date of Birth: 03/16/1946 Gender: Female Account #: [id number] Procedure:                Colonoscopy Indications:              Change in bowel habits Medicines:                Monitored Anesthesia Care Procedure:                Pre-Anesthesia Assessment:                           - Prior to the procedure, a History and Physical                            was performed, and patient medications and                            allergies were reviewed. The patient's tolerance of                            previous anesthesia was also reviewed. The risks                            and benefits of the procedure and the sedation                            options and risks were discussed with the patient.                            All questions were answered, and informed consent                            was obtained. Prior Anticoagulants: The patient has                            taken Plavix (clopidogrel), last dose was 5 days                            prior to procedure. ASA Grade Assessment: III - A                            patient with severe systemic disease. After                            reviewing the risks and benefits, the patient was                            deemed in satisfactory condition to undergo the                            procedure.  After obtaining informed consent, the colonoscope                            was passed under direct vision. Throughout the                            procedure, the patient's blood pressure, pulse, and                            oxygen saturations were monitored continuously. The                            Colonoscope was introduced through the anus and                            advanced to the the cecum, identified by   appendiceal orifice and ileocecal valve. The                            colonoscopy was performed without difficulty. The                            patient tolerated the procedure well. The quality                            of the bowel preparation was good. The ileocecal                            valve, appendiceal orifice, and rectum were                            photographed. Scope In: 11:31:53 AM Scope Out: 11:44:55 AM Scope Withdrawal Time: 0 hours 8 minutes 39 seconds  Total Procedure Duration: 0 hours 13 minutes 2 seconds  Findings:                 Internal hemorrhoids were found. The hemorrhoids                            were small.                           The exam was otherwise without abnormality on                            direct and retroflexion views. Complications:            No immediate complications. Estimated blood loss:                            None. Estimated Blood Loss:     Estimated blood loss: none. Impression:               - Internal hemorrhoids.                           - The examination was otherwise normal on direct  and retroflexion views.                           - No polyps or cancers. Recommendation:           - Patient has a contact number available for                            emergencies. The signs and symptoms of potential                            delayed complications were discussed with the                            patient. Return to normal activities tomorrow.                            Written discharge instructions were provided to the                            patient.                           - Resume previous diet.                           - Continue present medications. OK to restart your                            plavix today.                           You do not need any further colon cancer screening                            tests (including stool testing). These types of                             tests generally stop around age 4-80. Milus Banister, MD 11/03/2018 11:47:33 AM This report has been signed electronically.

## 2018-11-03 NOTE — Progress Notes (Signed)
To PACU, VSS. Report to [email or web address] 

## 2018-11-04 ENCOUNTER — Telehealth: Payer: Self-pay

## 2018-11-04 ENCOUNTER — Telehealth: Payer: Self-pay | Admitting: *Deleted

## 2018-11-04 NOTE — Telephone Encounter (Signed)
  Follow up Call-  Call back number 11/03/2018 01/26/2017  Post procedure Call Back phone  # [phone]  Permission to leave phone message Yes Yes  Some recent data might be hidden     Left message

## 2018-11-04 NOTE — Telephone Encounter (Signed)
  Follow up Call-  Call back number 11/03/2018 01/26/2017  Post procedure Call Back phone  # [phone]  Permission to leave phone message Yes Yes  Some recent data might be hidden     Patient questions:  Do you have a fever, pain , or abdominal swelling? No. Pain Score  0 *  Have you tolerated food without any problems? Yes.    Have you been able to return to your normal activities? Yes.    Do you have any questions about your discharge instructions: Diet   No. Medications  No. Follow up visit  No.  Do you have questions or concerns about your Care? No.  Actions: * If pain score is 4 or above: No action needed, pain <4.

## 2018-11-05 ENCOUNTER — Other Ambulatory Visit: Payer: Self-pay | Admitting: Physician Assistant

## 2018-11-30 DIAGNOSIS — F332 Major depressive disorder, recurrent severe without psychotic features: Secondary | ICD-10-CM | POA: Diagnosis not present

## 2018-12-02 ENCOUNTER — Ambulatory Visit (INDEPENDENT_AMBULATORY_CARE_PROVIDER_SITE_OTHER): Payer: BLUE CROSS/BLUE SHIELD | Admitting: Adult Health

## 2018-12-02 ENCOUNTER — Other Ambulatory Visit: Payer: Self-pay

## 2018-12-02 ENCOUNTER — Encounter: Payer: Self-pay | Admitting: Adult Health

## 2018-12-02 DIAGNOSIS — N3000 Acute cystitis without hematuria: Secondary | ICD-10-CM | POA: Diagnosis not present

## 2018-12-02 MED ORDER — CIPROFLOXACIN HCL 500 MG PO TABS: 500.0000 mg | ORAL_TABLET | Freq: Two times a day (BID) | ORAL | 0 refills | Status: DC

## 2018-12-02 NOTE — Progress Notes (Signed)
Virtual Visit via Video Note  I connected with Vaughan Basta  on 12/02/18 at 10:00 AM EDT by a video enabled telemedicine application and verified that I am speaking with the correct person using two identifiers.  Location patient: home Location provider:work or home office Persons participating in the virtual visit: patient, provider  I discussed the limitations of evaluation and management by telemedicine and the availability of in person appointments. The patient expressed understanding and agreed to proceed.   HPI: 73 year old female who is being evaluated today for possible urinary tract infection.  She reports her symptoms have been present for 2 to 3 days and include dysuria, lower pelvic pain and pressure, urinary frequency, incomplete bladder emptying, and odorous urine.  She denies any incontinence or dribbling.  She has chronic back pain and does not feel as though her lower back pain has become worse.  He denies any fevers or chills  States that this feels different than her chronic vaginal burning that she has had in the past   ROS: See pertinent positives and negatives per HPI.  Past Medical History:  Diagnosis Date  . Arthritis   . Blood transfusion without reported diagnosis   . CAD (coronary artery disease)    a. 1997 MI/PCI RCA;  b. 2007 MI/PCI of 100% RCA with Taxus DES x 3 placed, EF 55%;  c. 2010 Cath: stable anatomy;  d. 05/2012 NSTEMI/Cath/PCI: LM nl, LAD 60-55m, D1 20ost, LCX 54m, RCA 40-71m ISR, 60/95d (Treated w/ 2.75x33 Xience Xpedition DES), PDA 3m (Treated w/ PTCA), EF 60%.  . Carpal tunnel syndrome on both sides   . Chronic lower back pain   . Depression   . GERD (gastroesophageal reflux disease)   . Hyperlipidemia   . Myocardial infarction [facility]) 1997, 2007, 2013  . Numbness of foot    Left foot - back surgery 2009    Past Surgical History:  Procedure Laterality Date  . ANKLE SURGERY  Years ago   Left; "had to take out a floater"  . CARPAL TUNNEL  RELEASE Left 10/12016  . CESAREAN SECTION  1990  . CORONARY ANGIOPLASTY WITH STENT PLACEMENT  1997; 2007, 2013   "2 + 2; + 1= total of 5  . CORONARY ARTERY BYPASS GRAFT N/A 05/26/2017   Procedure: CORONARY ARTERY BYPASS GRAFTING (CABG), ON PUMP, TIMES Three, using left internal mammary artery and right greater saphenous vein harvested endoscopically;  Surgeon: [name], MD;  Location: [facility];  Service: Open Heart Surgery;  Laterality: N/A;  LIMA to LAD, SVG to OM 1, SVG to PDA  . LEFT HEART CATH AND CORONARY ANGIOGRAPHY N/A 05/26/2017   Procedure: LEFT HEART CATH AND CORONARY ANGIOGRAPHY;  Surgeon: Belva Crome, MD;  Location: [facility];  Service: Cardiovascular;  Laterality: N/A;  . LEFT HEART CATHETERIZATION WITH CORONARY ANGIOGRAM N/A 05/18/2012   Procedure: LEFT HEART CATHETERIZATION WITH CORONARY ANGIOGRAM;  Surgeon: Wellington Hampshire, MD;  Location: [facility];  Service: Cardiovascular;  Laterality: N/A;  . [place]; 2009  . TEE WITHOUT CARDIOVERSION N/A 05/26/2017   Procedure: TRANSESOPHAGEAL ECHOCARDIOGRAM (TEE);  Surgeon: [name], MD;  Location: [facility];  Service: Open Heart Surgery;  Laterality: N/A;  . TOTAL HIP ARTHROPLASTY Right 05/24/2014   Procedure: RIGHT TOTAL HIP ARTHROPLASTY ANTERIOR APPROACH;  Surgeon: Gearlean Alf, MD;  Location: [facility];  Service: Orthopedics;  Laterality: Right;    Family History  Problem Relation Age of Onset  .  Cancer Paternal Grandfather        Esophageal  . Coronary artery disease Mother   . Diabetes Mother   . Hypertension Mother   . Dementia Father   . Sudden death Brother        Suicide  . Colon cancer Neg Hx       Current Outpatient Medications:  .  acetaminophen (TYLENOL) 325 MG tablet, Take 2 tablets (650 mg total) by mouth every 6 (six) hours as needed for fever, headache, mild pain or moderate pain., Disp: , Rfl:  .  atorvastatin (LIPITOR) 80 MG tablet, TAKE 1 TABLET BY MOUTH  DAILY AT 6 PM. PLEASE MAKE YEARLY APPT FOR MARCH FOR FUTURE REFILLS, Disp: 90 tablet, Rfl: 3 .  ciprofloxacin (CIPRO) 500 MG tablet, Take 1 tablet (500 mg total) by mouth 2 (two) times daily., Disp: 6 tablet, Rfl: 0 .  clopidogrel (PLAVIX) 75 MG tablet, Take 1 tablet (75 mg total) by mouth daily., Disp: 90 tablet, Rfl: 3 .  diphenhydrAMINE (BENADRYL) 25 MG tablet, Take 50 mg by mouth every 6 (six) hours as needed for allergies., Disp: , Rfl:  .  DULoxetine (CYMBALTA) 60 MG capsule, Take 60 mg by mouth daily., Disp: , Rfl:  .  gabapentin (NEURONTIN) 300 MG capsule, Take 300 mg by mouth 2 (two) times daily., Disp: , Rfl:  .  Multiple Vitamin (MULTIVITAMIN WITH MINERALS) TABS tablet, Take 1 tablet by mouth at bedtime. , Disp: , Rfl:  .  nitroGLYCERIN (NITROSTAT) 0.4 MG SL tablet, Place 1 tablet (0.4 mg total) under the tongue every 5 (five) minutes as needed for chest pain. (Patient not taking: Reported on 11/03/2018), Disp: 50 tablet, Rfl: 3 .  pentosan polysulfate (ELMIRON) 100 MG capsule, Take 200 mg by mouth 2 (two) times daily as needed (side pains). , Disp: , Rfl:  .  Probiotic Product (PROBIOTIC PO), Take 1 capsule by mouth at bedtime. [employer] brand, Disp: , Rfl:  .  traZODone (DESYREL) 100 MG tablet, Take 150 mg by mouth at bedtime. , Disp: , Rfl:   EXAM:  VITALS per patient if applicable:  GENERAL: alert, oriented, appears well and in no acute distress  HEENT: atraumatic, conjunttiva clear, no obvious abnormalities on inspection of external nose and ears  NECK: normal movements of the head and neck  LUNGS: on inspection no signs of respiratory distress, breathing rate appears normal, no obvious gross SOB, gasping or wheezing  CV: no obvious cyanosis  MS: moves all visible extremities without noticeable abnormality  PSYCH/NEURO: pleasant and cooperative, no obvious depression or anxiety, speech and thought processing grossly intact  ASSESSMENT AND PLAN:  Discussed the  following assessment and plan:  Treat for presumed urinary tract infection.  She is unable to come into the office at this time for urine sample.  Advised follow-up if she does not improve.  Acute cystitis without hematuria - Plan: ciprofloxacin (CIPRO) 500 MG tablet     I discussed the assessment and treatment plan with the patient. The patient was provided an opportunity to ask questions and all were answered. The patient agreed with the plan and demonstrated an understanding of the instructions.   The patient was advised to call back or seek an in-person evaluation if the symptoms worsen or if the condition fails to improve as anticipated.   Bianca Peng, NP

## 2018-12-13 ENCOUNTER — Telehealth: Payer: Self-pay | Admitting: Adult Health

## 2018-12-13 NOTE — Telephone Encounter (Signed)
Copied from [place] [phone]. Topic: Quick Communication - See Telephone Encounter >> Dec 13, 2018  3:03 PM Robina Ade, Helene Kelp D wrote: CRM for notification. See Telephone encounter for: 12/13/18. Patient called and said that she still has vaginal pain and cramping. Patient would like another medication or something strong for pain. Call patient back if any questions.

## 2018-12-14 NOTE — Telephone Encounter (Signed)
LMOM for a return call.  

## 2018-12-14 NOTE — Telephone Encounter (Signed)
Pt sauys that cramps ar not going away. Pharm is [facility] [address]  She is asking for abx cb is [phone] She was told if she didn't get better antibiotic could be called in.

## 2018-12-14 NOTE — Telephone Encounter (Signed)
She will need to come and leave a urine sample

## 2018-12-15 ENCOUNTER — Other Ambulatory Visit: Payer: Self-pay

## 2018-12-15 ENCOUNTER — Other Ambulatory Visit: Payer: Self-pay | Admitting: Family Medicine

## 2018-12-15 ENCOUNTER — Other Ambulatory Visit (INDEPENDENT_AMBULATORY_CARE_PROVIDER_SITE_OTHER): Payer: BLUE CROSS/BLUE SHIELD

## 2018-12-15 DIAGNOSIS — N3000 Acute cystitis without hematuria: Secondary | ICD-10-CM | POA: Diagnosis not present

## 2018-12-15 LAB — URINALYSIS
Bilirubin Urine: NEGATIVE
Hgb urine dipstick: NEGATIVE
Ketones, ur: NEGATIVE
Leukocytes,Ua: NEGATIVE
Nitrite: NEGATIVE
Specific Gravity, Urine: <=1.005 — AB (ref 1.000–1.030)
Total Protein, Urine: NEGATIVE
Urine Glucose: NEGATIVE
Urobilinogen, UA: 0.2 (ref 0.0–1.0)
pH: 6.5 (ref 5.0–8.0)

## 2018-12-15 NOTE — Telephone Encounter (Signed)
Spoke to the pt and advised that Tommi Rumps would like her to come in for urinalysis.  Pt placed on schedule for today at 11 AM.  Order placed.  Nothing further needed.

## 2018-12-18 ENCOUNTER — Other Ambulatory Visit (HOSPITAL_COMMUNITY): Payer: Self-pay | Admitting: Psychiatry

## 2018-12-20 ENCOUNTER — Ambulatory Visit: Payer: Self-pay

## 2018-12-20 ENCOUNTER — Ambulatory Visit (INDEPENDENT_AMBULATORY_CARE_PROVIDER_SITE_OTHER): Payer: BLUE CROSS/BLUE SHIELD | Admitting: Family Medicine

## 2018-12-20 ENCOUNTER — Telehealth: Payer: Self-pay | Admitting: Interventional Cardiology

## 2018-12-20 ENCOUNTER — Encounter: Payer: Self-pay | Admitting: Family Medicine

## 2018-12-20 ENCOUNTER — Other Ambulatory Visit: Payer: Self-pay

## 2018-12-20 DIAGNOSIS — R0789 Other chest pain: Secondary | ICD-10-CM | POA: Diagnosis not present

## 2018-12-20 DIAGNOSIS — I25709 Atherosclerosis of coronary artery bypass graft(s), unspecified, with unspecified angina pectoris: Secondary | ICD-10-CM

## 2018-12-20 DIAGNOSIS — I9789 Other postprocedural complications and disorders of the circulatory system, not elsewhere classified: Secondary | ICD-10-CM

## 2018-12-20 DIAGNOSIS — I4891 Unspecified atrial fibrillation: Secondary | ICD-10-CM | POA: Diagnosis not present

## 2018-12-20 NOTE — Telephone Encounter (Signed)
Pt called to report that she has been not been "feeling well" for the past week.. she has episodes of feeling "sweaty" but does not think she has had a fever.. no cough.  Since yesterday she started having chest tightness on and off... similar to her ER visit 10/2018... not related to rest or exertion.. she denies sob, dizziness, radiation of the discomfort.. she is feeling fine now but had tightness when she had a virtual visit with Dr. Sarajane Jews today and he had her take a nitro but she said it did not help. I advised her if the tightness returns to try taking another one up to 3 in 15 minutes and she verbalized understanding.   She denies any Gi symptoms and says she just feels like she is "sick".   Dr. Sarajane Jews advised her to call Dr. Tamala Julian and no other recommendations except the ER if her symptoms worsen..   Will forward to Dr. Tamala Julian.

## 2018-12-20 NOTE — Telephone Encounter (Signed)
New message   Patient is having chest tightness and would like a call to discuss. Please call the patient.

## 2018-12-20 NOTE — Telephone Encounter (Signed)
Pt called stating that she has since last Wednesday had a SOB feeling.  She states that she has pressure feeling in the upper middle of her chest.  She states that she just doesn't feel well.  She has had no know exposure to COVID-19.  She has taken her temperature and dose not have one. The chest tightness does not travel to her jaw back or arm. She has been previously DX with MI and acid reflux. She has a raspy throat. She has no cough but is clearing her throat or coughing as we speak. Care advice for chest pain and COVID-19 read to patient. Pt verbalized all instructions. Call transferred to office for appointment.  Reason for Disposition . HIGH RISK patient (e.g., age > 30 years, diabetes, heart or lung disease, weak immune system) . [1] Patient claims chest pain is same as previously diagnosed "heartburn" AND [2] describes burning in chest AND [3] accompanying sour taste in mouth  Answer Assessment - Initial Assessment Questions 1. COVID-19 DIAGNOSIS: "Who made your Coronavirus (COVID-19) diagnosis?" "Was it confirmed by a positive lab test?" If not diagnosed by a HCP, ask "Are there lots of cases (community spread) where you live?" (See public health department website, if unsure)   * MAJOR community spread: high number of cases; numbers of cases are increasing; many people hospitalized.   * MINOR community spread: low number of cases; not increasing; few or no people hospitalized    None [place] 2. ONSET: "When did the COVID-19 symptoms start?"     Wednesday 3. WORST SYMPTOM: "What is your worst symptom?" (e.g., cough, fever, shortness of breath, muscle aches)     SOB Sweats 4. COUGH: "Do you have a cough?" If so, ask: "How bad is the cough?"       No 5. FEVER: "Do you have a fever?" If so, ask: "What is your temperature, how was it measured, and when did it start?"     No detectable fever just breaks into sweats 6. RESPIRATORY STATUS: "Describe your breathing?" (e.g., shortness of  breath, wheezing, unable to speak)      Feels that she has to help breathing  7. BETTER-SAME-WORSE: "Are you getting better, staying the same or getting worse compared to yesterday?"  If getting worse, ask, "In what way?"     same 8. HIGH RISK DISEASE: "Do you have any chronic medical problems?" (e.g., asthma, heart or lung disease, weak immune system, etc.)     MI,  9. PREGNANCY: "Is there any chance you are pregnant?" "When was your last menstrual period?"    N/A 10. OTHER SYMPTOMS: "Do you have any other symptoms?"  (e.g., runny nose, headache, sore throat, loss of smell)       Throat mild headaches fatigue  Answer Assessment - Initial Assessment Questions 1. LOCATION: "Where does it hurt?"       Tight in center high 2. RADIATION: "Does the pain go anywhere else?" (e.g., into neck, jaw, arms, back)    No 3. ONSET: "When did the chest pain begin?" (Minutes, hours or days)      Wednesday last week 4. PATTERN "Does the pain come and go, or has it been constant since it started?"  "Does it get worse with exertion?"     Constant  No does not get worse with exertion 5. DURATION: "How long does it last" (e.g., seconds, minutes, hours)     constant 6. SEVERITY: "How bad is the pain?"  (e.g., Scale 1-10; mild,  moderate, or severe)    - MILD (1-3): doesn't interfere with normal activities     - MODERATE (4-7): interferes with normal activities or awakens from sleep    - SEVERE (8-10): excruciating pain, unable to do any normal activities       moderate 7. CARDIAC RISK FACTORS: "Do you have any history of heart problems or risk factors for heart disease?" (e.g., prior heart attack, angina; high blood pressure, diabetes, being overweight, high cholesterol, smoking, or strong family history of high cholesterolheart disease)     MI High cholesterol 8. PULMONARY RISK FACTORS: "Do you have any history of lung disease?"  (e.g., blood clots in lung, asthma, emphysema, birth control pills)   No 9.  CAUSE: "What do you think is causing the chest pain?"     virus 10. OTHER SYMPTOMS: "Do you have any other symptoms?" (e.g., dizziness, nausea, vomiting, sweating, fever, difficulty breathing, cough)      sweats 11. PREGNANCY: "Is there any chance you are pregnant?" "When was your last menstrual period?"       N/A  Protocols used: CORONAVIRUS (COVID-19) DIAGNOSED OR SUSPECTED-A-AH, CHEST PAIN-A-AH

## 2018-12-20 NOTE — Progress Notes (Signed)
Subjective:    Patient ID: Bianca [name], female    DOB: April 06, 1946, 73 y.o.   MRN: [id number]  HPI Virtual Visit via Video Note  I connected with the patient on 12/20/18 at  3:30 PM EDT by a video enabled telemedicine application and verified that I am speaking with the correct person using two identifiers.  Location patient: home Location provider:work or home office Persons participating in the virtual visit: patient, provider  I discussed the limitations of evaluation and management by telemedicine and the availability of in person appointments. The patient expressed understanding and agreed to proceed.   HPI: Here to discuss some worrisome symptoms that began 4 days ago. She describes mild chest pains, SOB, tightness in the chest, sweats, loss of appetite, and nausea. She has vomited once. She does not feel like eating food but she is drinking plenty of fluids. No trouble swallowing. She denies any fever. Headaches, cough, ST, or body aches. She does not check her BP because she gave her cuff away. She denies any racing or skipping or pounding in the chest. She has not taken any NTG for this. These symptoms have been fairly constant for the 4 days, they are quite mild, and they do not keep her from sleeping. They do not respond to rest versus activity. She saw Dr. Daneen Schick, her cardiologist on 11-01-18 as a follow up to an ER visit recently for chest pain. At the ER visit her cardiac enzymes and EKG were all normal. He seemed pleased with her progress at his vist although she did mention chest pains that come and go. He kept all her medications the same but did give her a fresh supply of NTG. She denies any heartburn or indigestion. As we spoke I asked her to take a NTG and she did so. Then over the next few minutes I asked if this affected any of her symptoms, and she said yes "maybe a little".    ROS: See pertinent positives and negatives per HPI.  Past Medical History:  Diagnosis  Date  . Arthritis   . Blood transfusion without reported diagnosis   . CAD (coronary artery disease)    a. 1997 MI/PCI RCA;  b. 2007 MI/PCI of 100% RCA with Taxus DES x 3 placed, EF 55%;  c. 2010 Cath: stable anatomy;  d. 05/2012 NSTEMI/Cath/PCI: LM nl, LAD 60-68m, D1 20ost, LCX 32m, RCA 40-51m ISR, 60/95d (Treated w/ 2.75x33 Xience Xpedition DES), PDA 37m (Treated w/ PTCA), EF 60%.  . Carpal tunnel syndrome on both sides   . Chronic lower back pain   . Depression   . GERD (gastroesophageal reflux disease)   . Hyperlipidemia   . Myocardial infarction [facility]) 1997, 2007, 2013  . Numbness of foot    Left foot - back surgery 2009    Past Surgical History:  Procedure Laterality Date  . ANKLE SURGERY  Years ago   Left; "had to take out a floater"  . CARPAL TUNNEL RELEASE Left 10/12016  . CESAREAN SECTION  1990  . CORONARY ANGIOPLASTY WITH STENT PLACEMENT  1997; 2007, 2013   "2 + 2; + 1= total of 5  . CORONARY ARTERY BYPASS GRAFT N/A 05/26/2017   Procedure: CORONARY ARTERY BYPASS GRAFTING (CABG), ON PUMP, TIMES Three, using left internal mammary artery and right greater saphenous vein harvested endoscopically;  Surgeon: [name], MD;  Location: [place];  Service: Open Heart Surgery;  Laterality: N/A;  LIMA to LAD, SVG to OM  1, SVG to PDA  . LEFT HEART CATH AND CORONARY ANGIOGRAPHY N/A 05/26/2017   Procedure: LEFT HEART CATH AND CORONARY ANGIOGRAPHY;  Surgeon: Belva Crome, MD;  Location: [place] CV LAB;  Service: Cardiovascular;  Laterality: N/A;  . LEFT HEART CATHETERIZATION WITH CORONARY ANGIOGRAM N/A 05/18/2012   Procedure: LEFT HEART CATHETERIZATION WITH CORONARY ANGIOGRAM;  Surgeon: Wellington Hampshire, MD;  Location: [place] CATH LAB;  Service: Cardiovascular;  Laterality: N/A;  . [place]; 2009  . TEE WITHOUT CARDIOVERSION N/A 05/26/2017   Procedure: TRANSESOPHAGEAL ECHOCARDIOGRAM (TEE);  Surgeon: [name], MD;  Location: [place];  Service: Open  Heart Surgery;  Laterality: N/A;  . TOTAL HIP ARTHROPLASTY Right 05/24/2014   Procedure: RIGHT TOTAL HIP ARTHROPLASTY ANTERIOR APPROACH;  Surgeon: Gearlean Alf, MD;  Location: [facility];  Service: Orthopedics;  Laterality: Right;    Family History  Problem Relation Age of Onset  . Cancer Paternal Grandfather        Esophageal  . Coronary artery disease Mother   . Diabetes Mother   . Hypertension Mother   . Dementia Father   . Sudden death Brother        Suicide  . Colon cancer Neg Hx      Current Outpatient Medications:  .  acetaminophen (TYLENOL) 325 MG tablet, Take 2 tablets (650 mg total) by mouth every 6 (six) hours as needed for fever, headache, mild pain or moderate pain., Disp: , Rfl:  .  atorvastatin (LIPITOR) 80 MG tablet, TAKE 1 TABLET BY MOUTH DAILY AT 6 PM. PLEASE MAKE YEARLY APPT FOR MARCH FOR FUTURE REFILLS, Disp: 90 tablet, Rfl: 3 .  clopidogrel (PLAVIX) 75 MG tablet, Take 1 tablet (75 mg total) by mouth daily., Disp: 90 tablet, Rfl: 3 .  diphenhydrAMINE (BENADRYL) 25 MG tablet, Take 50 mg by mouth every 6 (six) hours as needed for allergies., Disp: , Rfl:  .  DULoxetine (CYMBALTA) 60 MG capsule, Take 60 mg by mouth daily., Disp: , Rfl:  .  gabapentin (NEURONTIN) 300 MG capsule, Take 300 mg by mouth 2 (two) times daily., Disp: , Rfl:  .  Multiple Vitamin (MULTIVITAMIN WITH MINERALS) TABS tablet, Take 1 tablet by mouth at bedtime. , Disp: , Rfl:  .  nitroGLYCERIN (NITROSTAT) 0.4 MG SL tablet, Place 1 tablet (0.4 mg total) under the tongue every 5 (five) minutes as needed for chest pain., Disp: 50 tablet, Rfl: 3 .  pentosan polysulfate (ELMIRON) 100 MG capsule, Take 200 mg by mouth 2 (two) times daily as needed (side pains). , Disp: , Rfl:  .  Probiotic Product (PROBIOTIC PO), Take 1 capsule by mouth at bedtime. Nature Bounty's brand, Disp: , Rfl:  .  traZODone (DESYREL) 100 MG tablet, Take 150 mg by mouth at bedtime. , Disp: , Rfl:  .  hydrOXYzine (ATARAX/VISTARIL) 50 MG  tablet, TAKE 1 TABLET (50 MG) BY MOUTH EVERY NIGHT AS NEEDED, Disp: , Rfl:   EXAM:  VITALS per patient if applicable:  GENERAL: alert, oriented, appears well and in no acute distress  HEENT: atraumatic, conjunttiva clear, no obvious abnormalities on inspection of external nose and ears  NECK: normal movements of the head and neck  LUNGS: on inspection no signs of respiratory distress, breathing rate appears normal, no obvious gross SOB, gasping or wheezing  CV: no obvious cyanosis  MS: moves all visible extremities without noticeable abnormality  PSYCH/NEURO: pleasant and cooperative, no obvious depression or anxiety, speech and thought processing  grossly intact  ASSESSMENT AND PLAN: Her symptoms are concerning for angina or anginal equivalents, although the fact that they are not worsened by exertion and they do not respond to NTG makes this a bit confusing. If this is due to angina, it seems to be stable rather than unstable. To be cautious I advised her to go to the ER this afternoon, but she declined. She said she would rather give Dr. Tamala Julian a call, and I agreed this would be a good idea. She will let us know if anything changes.  Alysia Penna, MD  Discussed the following assessment and plan:  No diagnosis found.     I discussed the assessment and treatment plan with the patient. The patient was provided an opportunity to ask questions and all were answered. The patient agreed with the plan and demonstrated an understanding of the instructions.   The patient was advised to call back or seek an in-person evaluation if the symptoms worsen or if the condition fails to improve as anticipated.     Review of Systems     Objective:   Physical Exam        Assessment & Plan:

## 2018-12-21 NOTE — Telephone Encounter (Signed)
Spoke with pt and made her aware of recommendations. Pt verbalized understanding and was in agreement with plan.  Denies any issues today and states she feels much better.

## 2018-12-21 NOTE — Telephone Encounter (Signed)
Probably not cardiac if not influenced by NTG and has had one ER visit for same. Agree, if worsens, back to ER.

## 2018-12-23 DIAGNOSIS — J309 Allergic rhinitis, unspecified: Secondary | ICD-10-CM | POA: Diagnosis not present

## 2018-12-23 DIAGNOSIS — J209 Acute bronchitis, unspecified: Secondary | ICD-10-CM | POA: Diagnosis not present

## 2019-01-04 ENCOUNTER — Telehealth: Payer: Self-pay | Admitting: Obstetrics and Gynecology

## 2019-01-04 NOTE — Telephone Encounter (Signed)
Spoke with patient. Patient reports constant vaginal burning that started approximately 2 wks ago. Denies vaginal odor, bleeding, discharge, pelvic pain or urinary symptoms. Covid19 pre screening negative. Patient requesting OV with Dr. Talbert Nan. OV scheduled for 5/20 at 4pm. Patient verbalizes understanding.  Routing to provider for final review. Patient is agreeable to disposition. Will close encounter.

## 2019-01-04 NOTE — Telephone Encounter (Signed)
Patient is calling with "vaginal burning". She would like an appointment, to triage to assist with scheduling.

## 2019-01-05 ENCOUNTER — Other Ambulatory Visit: Payer: Self-pay

## 2019-01-05 ENCOUNTER — Ambulatory Visit: Payer: BLUE CROSS/BLUE SHIELD | Admitting: Obstetrics and Gynecology

## 2019-01-05 ENCOUNTER — Encounter: Payer: Self-pay | Admitting: Obstetrics and Gynecology

## 2019-01-05 VITALS — BP 136/82 | HR 76 | Temp 97.2°F | Wt 169.0 lb

## 2019-01-05 DIAGNOSIS — N949 Unspecified condition associated with female genital organs and menstrual cycle: Secondary | ICD-10-CM

## 2019-01-05 DIAGNOSIS — R300 Dysuria: Secondary | ICD-10-CM | POA: Diagnosis not present

## 2019-01-05 NOTE — Patient Instructions (Signed)
Use Replens vaginal moisturizer in your vagina Can try Vaseline or coconut oil externally

## 2019-01-05 NOTE — Progress Notes (Signed)
GYNECOLOGY  VISIT   HPI: 73 y.o.   Married White or Caucasian Not Hispanic or Latino  female   G2P2 with No LMP recorded (lmp unknown). Patient is postmenopausal.   here for vaginal burning that started 2 weeks ago. Was treated with Cipro for UTI via telemedicine and was told to be seen in our office if symptoms persisted. Denies any urinary symptoms, fever, chills, or back pain. She continues to have dysuria, but has constant vaginal burning. No itching, no vaginal discharge. No urinary frequency or urgency. No fevers. No abdominal or flank pain. Not sexually active, husband had a stroke, no deficits.   GYNECOLOGIC HISTORY: No LMP recorded (lmp unknown). Patient is postmenopausal. Contraception: Postmenopausal Menopausal hormone therapy: None        OB History    Gravida  2   Para  2   Term      Preterm      AB      Living  2     SAB      TAB      Ectopic      Multiple      Live Births                 Patient Active Problem List   Diagnosis Date Noted  . Coronary artery disease involving coronary bypass graft of native heart with angina pectoris ([place]) 08/28/2017  . Postoperative atrial fibrillation ([place]) 08/28/2017  . RAD (reactive airway disease) 04/01/2016  . B12 deficiency 07/11/2015  . Anemia associated with acute blood loss 05/28/2014  . Cigarette smoker 05/28/2014  . OA (osteoarthritis) of hip 05/24/2014  . Allergic rhinitis due to pollen 04/01/2012  . Back pain 11/22/2010  . NEOPLASM OF UNCERTAIN BEHAVIOR OF SKIN 06/05/2010  . Swallowing difficulty 06/04/2010  . GERD 07/30/2009  . Hyperlipidemia with target LDL less than 70 03/02/2007  . Depression 03/02/2007    Past Medical History:  Diagnosis Date  . Arthritis   . Blood transfusion without reported diagnosis   . CAD (coronary artery disease)    a. 1997 MI/PCI RCA;  b. 2007 MI/PCI of 100% RCA with Taxus DES x 3 placed, EF 55%;  c. 2010 Cath: stable anatomy;  d. 05/2012 NSTEMI/Cath/PCI: LM nl,  LAD 60-5m, D1 20ost, LCX 63m, RCA 40-58m ISR, 60/95d (Treated w/ 2.75x33 Xience Xpedition DES), PDA 60m (Treated w/ PTCA), EF 60%.  . Carpal tunnel syndrome on both sides   . Chronic lower back pain   . Depression   . GERD (gastroesophageal reflux disease)   . Hyperlipidemia   . Myocardial infarction [facility]) 1997, 2007, 2013  . Numbness of foot    Left foot - back surgery 2009    Past Surgical History:  Procedure Laterality Date  . ANKLE SURGERY  Years ago   Left; "had to take out a floater"  . CARPAL TUNNEL RELEASE Left 10/12016  . CESAREAN SECTION  1990  . CORONARY ANGIOPLASTY WITH STENT PLACEMENT  1997; 2007, 2013   "2 + 2; + 1= total of 5  . CORONARY ARTERY BYPASS GRAFT N/A 05/26/2017   Procedure: CORONARY ARTERY BYPASS GRAFTING (CABG), ON PUMP, TIMES Three, using left internal mammary artery and right greater saphenous vein harvested endoscopically;  Surgeon: [name], MD;  Location: [place];  Service: Open Heart Surgery;  Laterality: N/A;  LIMA to LAD, SVG to OM 1, SVG to PDA  . LEFT HEART CATH AND CORONARY ANGIOGRAPHY N/A 05/26/2017   Procedure: LEFT HEART  CATH AND CORONARY ANGIOGRAPHY;  Surgeon: Belva Crome, MD;  Location: [place] CV LAB;  Service: Cardiovascular;  Laterality: N/A;  . LEFT HEART CATHETERIZATION WITH CORONARY ANGIOGRAM N/A 05/18/2012   Procedure: LEFT HEART CATHETERIZATION WITH CORONARY ANGIOGRAM;  Surgeon: Wellington Hampshire, MD;  Location: [place] CATH LAB;  Service: Cardiovascular;  Laterality: N/A;  . [place]; 2009  . TEE WITHOUT CARDIOVERSION N/A 05/26/2017   Procedure: TRANSESOPHAGEAL ECHOCARDIOGRAM (TEE);  Surgeon: [name], MD;  Location: [place];  Service: Open Heart Surgery;  Laterality: N/A;  . TOTAL HIP ARTHROPLASTY Right 05/24/2014   Procedure: RIGHT TOTAL HIP ARTHROPLASTY ANTERIOR APPROACH;  Surgeon: Gearlean Alf, MD;  [facility];  Service: Orthopedics;  Laterality: Right;    Current Outpatient  Medications  Medication Sig Dispense Refill  . acetaminophen (TYLENOL) 325 MG tablet Take 2 tablets (650 mg total) by mouth every 6 (six) hours as needed for fever, headache, mild pain or moderate pain.    Marland Kitchen atorvastatin (LIPITOR) 80 MG tablet TAKE 1 TABLET BY MOUTH DAILY AT 6 PM. PLEASE MAKE YEARLY APPT FOR MARCH FOR FUTURE REFILLS 90 tablet 3  . clopidogrel (PLAVIX) 75 MG tablet Take 1 tablet (75 mg total) by mouth daily. 90 tablet 3  . diphenhydrAMINE (BENADRYL) 25 MG tablet Take 50 mg by mouth every 6 (six) hours as needed for allergies.    . DULoxetine (CYMBALTA) 60 MG capsule Take 60 mg by mouth daily.    Marland Kitchen gabapentin (NEURONTIN) 300 MG capsule Take 300 mg by mouth 2 (two) times daily.    . hydrOXYzine (ATARAX/VISTARIL) 50 MG tablet TAKE 1 TABLET (50 MG) BY MOUTH EVERY NIGHT AS NEEDED    . Multiple Vitamin (MULTIVITAMIN WITH MINERALS) TABS tablet Take 1 tablet by mouth at bedtime.     . nitroGLYCERIN (NITROSTAT) 0.4 MG SL tablet Place 1 tablet (0.4 mg total) under the tongue every 5 (five) minutes as needed for chest pain. 50 tablet 3  . pentosan polysulfate (ELMIRON) 100 MG capsule Take 200 mg by mouth 2 (two) times daily as needed (side pains).     . Probiotic Product (PROBIOTIC PO) Take 1 capsule by mouth at bedtime. Nature Bounty's brand     No current facility-administered medications for this visit.      ALLERGIES: Oxycodone hcl; Penicillins; Tramadol; Aspirin; Macrobid [nitrofurantoin monohyd macro]; and Sulfa antibiotics  Family History  Problem Relation Age of Onset  . Cancer Paternal Grandfather        Esophageal  . Coronary artery disease Mother   . Diabetes Mother   . Hypertension Mother   . Dementia Father   . Sudden death Brother        Suicide  . Colon cancer Neg Hx     Social History   Socioeconomic History  . Marital status: Married    Spouse name: Not on file  . Number of children: Not on file  . Years of education: Not on file  . Highest education  level: Not on file  Occupational History  . Not on file  Social Needs  . Financial resource strain: Not on file  . Food insecurity:    Worry: Not on file    Inability: Not on file  . Transportation needs:    Medical: Not on file    Non-medical: Not on file  Tobacco Use  . Smoking status: Current Some Day Smoker    Packs/day: 0.25    Years: 50.00  Pack years: 12.50    Types: Cigarettes  . Smokeless tobacco: Never Used  Substance and Sexual Activity  . Alcohol use: No    Alcohol/week: 0.0 standard drinks  . Drug use: No  . Sexual activity: Not Currently    Birth control/protection: Post-menopausal  Lifestyle  . Physical activity:    Days per week: Not on file    Minutes per session: Not on file  . Stress: Not on file  Relationships  . Social connections:    Talks on phone: Not on file    Gets together: Not on file    Attends religious service: Not on file    Active member of club or organization: Not on file    Attends meetings of clubs or organizations: Not on file    Relationship status: Not on file  . Intimate partner violence:    Fear of current or ex partner: Not on file    Emotionally abused: Not on file    Physically abused: Not on file    Forced sexual activity: Not on file  Other Topics Concern  . Not on file  Social History Narrative   Is a homemaker   Married for 35 years    Has a daughter and son    Pets: Two dogs and a Neurosurgeon   Likes to garden ( flower beds).        Review of Systems  Constitutional: Negative.   HENT: Negative.   Eyes: Negative.   Respiratory: Negative.   Cardiovascular: Negative.   Gastrointestinal: Negative.   Genitourinary:       Vaginal burning  Musculoskeletal: Negative.   Skin: Negative.   Neurological: Negative.   Endo/Heme/Allergies: Negative.   Psychiatric/Behavioral: Negative.     PHYSICAL EXAMINATION:    BP 136/82 (BP Location: Right Arm, Patient Position: Sitting, Cuff Size: Normal)   Pulse 76   Temp (!)  97.2 F (36.2 C) (Skin)   Wt 169 lb (76.7 kg)   LMP  (LMP Unknown)   BMI 30.91 kg/m     General appearance: alert, cooperative and appears stated age Abdomen: soft, non-tender; non distended, no masses,  no organomegaly CVA: not tender  Pelvic: External genitalia:  no lesions, atrophic              Urethra:  normal appearing urethra with no masses, tenderness or lesions              Bartholins and Skenes: normal                 Vagina: atrophic appearing vagina, no discharge, no lesions              Cervix: no lesions              Bimanual Exam:  Uterus:  normal size, contour, position, consistency, mobility, non-tender              Adnexa: no mass, fullness, tenderness                Chaperone was present for exam.  ASSESSMENT Vaginal burning Dysuria    PLAN Affirm Send urine for ua, c&s Can try Replens internally and Vaseline or coconut oil externally. F/U for annual exam   An After Visit Summary was printed and given to the patient.

## 2019-01-06 ENCOUNTER — Telehealth: Payer: Self-pay

## 2019-01-06 LAB — VAGINITIS/VAGINOSIS, DNA PROBE
Candida Species: NEGATIVE
Gardnerella vaginalis: NEGATIVE
Trichomonas vaginosis: NEGATIVE

## 2019-01-06 LAB — URINE CULTURE

## 2019-01-06 LAB — URINALYSIS, MICROSCOPIC ONLY: Bacteria, UA: NONE SEEN

## 2019-01-06 NOTE — Telephone Encounter (Signed)
-----   Message from Salvadore Dom, MD sent at 01/06/2019  8:40 AM EDT ----- Please advise the patient of normal results. Urine culture pending.  She should try the Replens vaginal moisturizer and either Vaseline or Coconut oil topically and let us know if she isn't feeling better by next week.

## 2019-01-06 NOTE — Telephone Encounter (Signed)
Spoke with patient. Results given. Patient verbalizes understanding. Encounter closed. 

## 2019-01-11 ENCOUNTER — Telehealth: Payer: Self-pay

## 2019-01-11 DIAGNOSIS — F332 Major depressive disorder, recurrent severe without psychotic features: Secondary | ICD-10-CM | POA: Diagnosis not present

## 2019-01-11 NOTE — Telephone Encounter (Signed)
-----   Message from Bianca Dom, MD sent at 01/07/2019 11:06 AM EDT ----- Please advise the patient of normal results. See if she is starting to feel any better. If no improvement by next week she should be seen back.

## 2019-01-11 NOTE — Telephone Encounter (Signed)
Spoke with patient. Results given. Patient verbalizes understanding. Patient states that she is feeling better. Will monitor symptoms and return to office if symptoms reoccur or worsen. Encounter closed.

## 2019-02-01 DIAGNOSIS — G5601 Carpal tunnel syndrome, right upper limb: Secondary | ICD-10-CM | POA: Diagnosis not present

## 2019-02-01 DIAGNOSIS — M65332 Trigger finger, left middle finger: Secondary | ICD-10-CM | POA: Diagnosis not present

## 2019-02-07 DIAGNOSIS — G5601 Carpal tunnel syndrome, right upper limb: Secondary | ICD-10-CM | POA: Diagnosis not present

## 2019-02-10 ENCOUNTER — Telehealth: Payer: Self-pay | Admitting: *Deleted

## 2019-02-10 NOTE — Telephone Encounter (Signed)
Dr. Tamala Julian, ok to hold plavix for 5-7 days prior to carpal tunnel release surgery on 7/6. Last intervention was in 05/2017 when she had CABG. No ischemic workup since then, however carpal tunnel surgery is a fairly low risk surgery.   Patient is currently not at home, I spoke to her family member, will attempt to call her after 5:30PM today to see how she is doing since the last office visit.

## 2019-02-10 NOTE — Telephone Encounter (Signed)
I called Mrs. Bianca [name], she is quite independent and able to complete at least 4 METS of activity, although he does have back pain and hip pain, it has not stopped her from doing every activity. Waiting on Dr. Tamala Julian to see if able to hold plavix for 5-7 days prior to the surgery.

## 2019-02-10 NOTE — Telephone Encounter (Signed)
   [facility] Pre-operative Risk Assessment    Request for surgical clearance:  1. What type of surgery is being performed? RIGHT CARPAL TUNNEL RELEASE   2. When is this surgery scheduled? 02/21/19  3. What type of clearance is required (medical clearance vs. Pharmacy clearance to hold med vs. Both)? MEDICAL  4. Are there any medications that need to be held prior to surgery and how long? PLAVIX   5. Practice name and name of physician performing surgery? [facility]; DR. Jeneen Rinks [place]  6. What is your office phone number [phone]   7.   What is your office fax number [phone]  8.   Anesthesia type (None, local, MAC, general) ? MAC   Bianca [name] 02/10/2019, 10:36 AM  _________________________________________________________________   (provider comments below)

## 2019-02-11 NOTE — Telephone Encounter (Signed)
   Primary Cardiologist: Sinclair Grooms, MD  Chart reviewed as part of pre-operative protocol coverage. Patient was contacted 02/11/2019 in reference to pre-operative risk assessment for pending surgery as outlined below.  Bianca [name] was last seen on 11/01/2018 by Dr. Tamala Julian.  Since that day, Bianca [name] has done well without any exertional chest pain or shortness of breath.  Therefore, based on ACC/AHA guidelines, the patient would be at acceptable risk for the planned procedure without further cardiovascular testing.   I will route this recommendation to the requesting party via Epic fax function and remove from pre-op pool.   Please call with questions. She may hold plavix for 5-7 days prior to the surgery and restart it afterward as soon as possible at the discretion of the surgeon.  Almyra Deforest, [place] 02/11/2019, 9:31 AM

## 2019-02-11 NOTE — Telephone Encounter (Signed)
Okay to hold plavix for the timeframe noted.

## 2019-02-14 DIAGNOSIS — Z79891 Long term (current) use of opiate analgesic: Secondary | ICD-10-CM | POA: Diagnosis not present

## 2019-02-21 DIAGNOSIS — G5601 Carpal tunnel syndrome, right upper limb: Secondary | ICD-10-CM | POA: Diagnosis not present

## 2019-02-21 HISTORY — PX: CARPAL TUNNEL RELEASE: SHX101

## 2019-03-17 IMAGING — NM NM MISC PROCEDURE
3 series · 18 of 18 positions shown · non-contrast
Comparison: none

[Series 1: wbr_s-proj_st stress_(id)_sa · 6.5mm · 6.51mm/px · 6 of 512 frames shown (1 of 2)]
[frame 43/512]
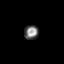
[frame 128/512]
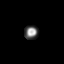
[frame 214/512]
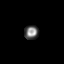
[frame 299/512]
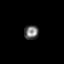
[frame 384/512]
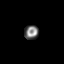
[frame 470/512]
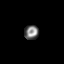

[Series 1: wbr_r-proj_st rest_(id)_sa · 6.5mm · 6.51mm/px · 6 of 64 frames shown]
[frame 6/64]
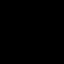
[frame 16/64]
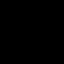
[frame 27/64]
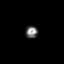
[frame 38/64]
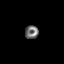
[frame 48/64]
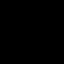
[frame 59/64]
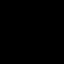

[Series 1: wbr_s-proj_st stress_(id)_sa · 6.5mm · 6.51mm/px · 6 of 64 frames shown (2 of 2)]
[frame 6/64]
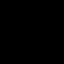
[frame 16/64]
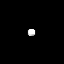
[frame 27/64]
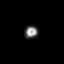
[frame 38/64]
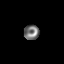
[frame 48/64]
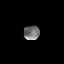
[frame 59/64]
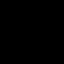

[18 of 18 positions shown; findings below may reference images not displayed]

Canned report from images found in remote index.

Refer to host system for actual result text.

## 2019-03-28 ENCOUNTER — Emergency Department (HOSPITAL_COMMUNITY): Payer: BC Managed Care – PPO

## 2019-03-28 ENCOUNTER — Telehealth: Payer: Self-pay | Admitting: Adult Health

## 2019-03-28 ENCOUNTER — Encounter (HOSPITAL_COMMUNITY): Payer: Self-pay | Admitting: Emergency Medicine

## 2019-03-28 ENCOUNTER — Other Ambulatory Visit: Payer: Self-pay

## 2019-03-28 ENCOUNTER — Emergency Department (HOSPITAL_COMMUNITY)
Admission: EM | Admit: 2019-03-28 | Discharge: 2019-03-28 | Disposition: A | Discharge status: Home / Self Care | Payer: BC Managed Care – PPO | Attending: Emergency Medicine | Admitting: Emergency Medicine

## 2019-03-28 ENCOUNTER — Ambulatory Visit: Payer: Self-pay | Admitting: Adult Health

## 2019-03-28 DIAGNOSIS — Z79899 Other long term (current) drug therapy: Secondary | ICD-10-CM | POA: Diagnosis not present

## 2019-03-28 DIAGNOSIS — R0602 Shortness of breath: Secondary | ICD-10-CM | POA: Insufficient documentation

## 2019-03-28 DIAGNOSIS — R05 Cough: Secondary | ICD-10-CM | POA: Diagnosis not present

## 2019-03-28 DIAGNOSIS — F17210 Nicotine dependence, cigarettes, uncomplicated: Secondary | ICD-10-CM | POA: Insufficient documentation

## 2019-03-28 DIAGNOSIS — R0600 Dyspnea, unspecified: Secondary | ICD-10-CM | POA: Diagnosis not present

## 2019-03-28 DIAGNOSIS — Z7902 Long term (current) use of antithrombotics/antiplatelets: Secondary | ICD-10-CM | POA: Insufficient documentation

## 2019-03-28 DIAGNOSIS — Z20828 Contact with and (suspected) exposure to other viral communicable diseases: Secondary | ICD-10-CM | POA: Insufficient documentation

## 2019-03-28 LAB — BASIC METABOLIC PANEL
Anion gap: 9 (ref 5–15)
BUN: 7 mg/dL — ABNORMAL LOW (ref 8–23)
CO2: 25 mmol/L (ref 22–32)
Calcium: 9.4 mg/dL (ref 8.9–10.3)
Chloride: 104 mmol/L (ref 98–111)
Creatinine, Ser: 0.79 mg/dL (ref 0.44–1.00)
GFR calc Af Amer: >60 mL/min (ref >60)
GFR calc non Af Amer: >60 mL/min (ref >60)
Glucose, Bld: 96 mg/dL (ref 70–99)
Potassium: 3.9 mmol/L (ref 3.5–5.1)
Sodium: 138 mmol/L (ref 135–145)

## 2019-03-28 LAB — URINALYSIS, ROUTINE W REFLEX MICROSCOPIC
Bacteria, UA: NONE SEEN
Bilirubin Urine: NEGATIVE
Glucose, UA: NEGATIVE mg/dL
Hgb urine dipstick: NEGATIVE
Ketones, ur: NEGATIVE mg/dL
Nitrite: NEGATIVE
Protein, ur: NEGATIVE mg/dL
Specific Gravity, Urine: 1.004 — ABNORMAL LOW (ref 1.005–1.030)
pH: 5 (ref 5.0–8.0)

## 2019-03-28 LAB — CBC WITH DIFFERENTIAL/PLATELET
Abs Immature Granulocytes: 0.01 10*3/uL (ref 0.00–0.07)
Basophils Absolute: 0.1 10*3/uL (ref 0.0–0.1)
Basophils Relative: 1 %
Eosinophils Absolute: 0.1 10*3/uL (ref 0.0–0.5)
Eosinophils Relative: 1 %
HCT: 42.1 % (ref 36.0–46.0)
Hemoglobin: 13.7 g/dL (ref 12.0–15.0)
Immature Granulocytes: 0 %
Lymphocytes Relative: 35 %
Lymphs Abs: 2.4 10*3/uL (ref 0.7–4.0)
MCH: 29.8 pg (ref 26.0–34.0)
MCHC: 32.5 g/dL (ref 30.0–36.0)
MCV: 91.7 fL (ref 80.0–100.0)
Monocytes Absolute: 0.6 10*3/uL (ref 0.1–1.0)
Monocytes Relative: 9 %
Neutro Abs: 3.7 10*3/uL (ref 1.7–7.7)
Neutrophils Relative %: 54 %
Platelets: 258 10*3/uL (ref 150–400)
RBC: 4.59 MIL/uL (ref 3.87–5.11)
RDW: 12.1 % (ref 11.5–15.5)
WBC: 7 10*3/uL (ref 4.0–10.5)
nRBC: 0 % (ref 0.0–0.2)

## 2019-03-28 LAB — TROPONIN I (HIGH SENSITIVITY)
Troponin I (High Sensitivity): 5 ng/L (ref <18)
Troponin I (High Sensitivity): 5 ng/L (ref <18)

## 2019-03-28 NOTE — ED Notes (Signed)
Urine and culture sent to lab  

## 2019-03-28 NOTE — Telephone Encounter (Addendum)
Patient called with SOB and heaviness in chest- I spoke to Jamie/advised patient to go to ED//tes

## 2019-03-28 NOTE — Telephone Encounter (Signed)
Pt called in c/o a heaviness in her chest and her breathing being different.  She does have a history of heart problems per pt but "this does not feel like my heart".   "My breathing just does not feel right.   It's hard to get in a breath.  I called into [facility] office because I felt she needed to go to the ED but she did not want to go;   I spoke with Tammy who agreed after checking with Narda Rutherford also that she needs to go to the ED.    I let the pt know this so she is going to [facility].    I sent my notes to the office. Reason for Disposition . [1] MILD difficulty breathing (e.g., minimal/no SOB at rest, SOB with walking, pulse <100) AND [2] NEW-onset or WORSE than normal  Answer Assessment - Initial Assessment Questions 1. RESPIRATORY STATUS: "Describe your breathing?" (e.g., wheezing, shortness of breath, unable to speak, severe coughing)      I feel constantly like my chest is heavy and I'm having a hard time getting a breath in.   2. ONSET: "When did this breathing problem begin?"      Saturday.    No fever.  3. PATTERN "Does the difficult breathing come and go, or has it been constant since it started?"      I'm breathing heavy climbing steps. 4. SEVERITY: "How bad is your breathing?" (e.g., mild, moderate, severe)    - MILD: No SOB at rest, mild SOB with walking, speaks normally in sentences, can lay down, no retractions, pulse < 100.    - MODERATE: SOB at rest, SOB with minimal exertion and prefers to sit, cannot lie down flat, speaks in phrases, mild retractions, audible wheezing, pulse 100-120.    - SEVERE: Very SOB at rest, speaks in single words, struggling to breathe, sitting hunched forward, retractions, pulse > 120      Mild 5. RECURRENT SYMPTOM: "Have you had difficulty breathing before?" If so, ask: "When was the last time?" and "What happened that time?"      No 6. CARDIAC HISTORY: "Do you have any history of heart disease?" (e.g., heart attack, angina,  bypass surgery, angioplasty)      I do have a heart history but this is not my heart. 7. LUNG HISTORY: "Do you have any history of lung disease?"  (e.g., pulmonary embolus, asthma, emphysema)     No 8. CAUSE: "What do you think is causing the breathing problem?"      Maybe a cold 9. OTHER SYMPTOMS: "Do you have any other symptoms? (e.g., dizziness, runny nose, cough, chest pain, fever)     No sore throat or runny nose.   I cough at night.  Dry cough  I can tell there is something in my throat like a post nasal drip. 10. PREGNANCY: "Is there any chance you are pregnant?" "When was your last menstrual period?"       N/A 11. TRAVEL: "Have you traveled out of the country in the last month?" (e.g., travel history, exposures)       No  Protocols used: BREATHING DIFFICULTY-A-AH

## 2019-03-28 NOTE — ED Notes (Signed)
Pt ambulated to the bathroom without assistance. Gait steady  

## 2019-03-28 NOTE — ED Notes (Signed)
Pt verbalized discharge instructions and follow up care. Alert and ambulatory. No iv. Husband is picking pt up

## 2019-03-28 NOTE — ED Provider Notes (Signed)
[facility] DEPT Provider Note   CSN: [id number] Arrival date & time: 03/28/19  1559     History   Chief Complaint Chief Complaint  Patient presents with  . Cough  . Shortness of Breath    HPI Bianca [name] is a 73 y.o. female.     HPI Patient presents with shortness of breath for the last 2 days.  Some mild nasal congestion.  Cough without sputum production.  States she is been coughing for a week however.  Somewhat more fatigue.  No blood in the stool or black stools.  No chest pain.  Some shortness of breath exertion but no chest pain.  No swelling in the legs.  No fevers.  No weight loss. Past Medical History:  Diagnosis Date  . Arthritis   . Blood transfusion without reported diagnosis   . CAD (coronary artery disease)    a. 1997 MI/PCI RCA;  b. 2007 MI/PCI of 100% RCA with Taxus DES x 3 placed, EF 55%;  c. 2010 Cath: stable anatomy;  d. 05/2012 NSTEMI/Cath/PCI: LM nl, LAD 60-52m, D1 20ost, LCX 57m, RCA 40-44m ISR, 60/95d (Treated w/ 2.75x33 Xience Xpedition DES), PDA 7m (Treated w/ PTCA), EF 60%.  . Carpal tunnel syndrome on both sides   . Chronic lower back pain   . Depression   . GERD (gastroesophageal reflux disease)   . Hyperlipidemia   . Myocardial infarction [facility]) 1997, 2007, 2013  . Numbness of foot    Left foot - back surgery 2009    Patient Active Problem List   Diagnosis Date Noted  . Coronary artery disease involving coronary bypass graft of native heart with angina pectoris ([place]) 08/28/2017  . Postoperative atrial fibrillation ([place]) 08/28/2017  . RAD (reactive airway disease) 04/01/2016  . B12 deficiency 07/11/2015  . Anemia associated with acute blood loss 05/28/2014  . Cigarette smoker 05/28/2014  . OA (osteoarthritis) of hip 05/24/2014  . Allergic rhinitis due to pollen 04/01/2012  . Back pain 11/22/2010  . NEOPLASM OF UNCERTAIN BEHAVIOR OF SKIN 06/05/2010  . Swallowing difficulty 06/04/2010  . GERD 07/30/2009   . Hyperlipidemia with target LDL less than 70 03/02/2007  . Depression 03/02/2007    Past Surgical History:  Procedure Laterality Date  . ANKLE SURGERY  Years ago   Left; "had to take out a floater"  . CARPAL TUNNEL RELEASE Left 10/12016  . CESAREAN SECTION  1990  . CORONARY ANGIOPLASTY WITH STENT PLACEMENT  1997; 2007, 2013   "2 + 2; + 1= total of 5  . CORONARY ARTERY BYPASS GRAFT N/A 05/26/2017   Procedure: CORONARY ARTERY BYPASS GRAFTING (CABG), ON PUMP, TIMES Three, using left internal mammary artery and right greater saphenous vein harvested endoscopically;  Surgeon: [name], MD;  Location: [facility];  Service: Open Heart Surgery;  Laterality: N/A;  LIMA to LAD, SVG to OM 1, SVG to PDA  . LEFT HEART CATH AND CORONARY ANGIOGRAPHY N/A 05/26/2017   Procedure: LEFT HEART CATH AND CORONARY ANGIOGRAPHY;  Surgeon: Belva Crome, MD;  Location: [facility] CV LAB;  Service: Cardiovascular;  Laterality: N/A;  . LEFT HEART CATHETERIZATION WITH CORONARY ANGIOGRAM N/A 05/18/2012   Procedure: LEFT HEART CATHETERIZATION WITH CORONARY ANGIOGRAM;  Surgeon: Wellington Hampshire, MD;  Location: [facility] CATH LAB;  Service: Cardiovascular;  Laterality: N/A;  . [place]; 2009  . TEE WITHOUT CARDIOVERSION N/A 05/26/2017   Procedure: TRANSESOPHAGEAL ECHOCARDIOGRAM (TEE);  Surgeon: [name], MD;  Location: [facility];  Service: Open Heart Surgery;  Laterality: N/A;  . TOTAL HIP ARTHROPLASTY Right 05/24/2014   Procedure: RIGHT TOTAL HIP ARTHROPLASTY ANTERIOR APPROACH;  Surgeon: Gearlean Alf, MD;  Location: [facility];  Service: Orthopedics;  Laterality: Right;     OB History    Gravida  2   Para  2   Term      Preterm      AB      Living  2     SAB      TAB      Ectopic      Multiple      Live Births               Home Medications    Prior to Admission medications   Medication Sig Start Date End Date Taking? Authorizing Provider  acetaminophen  (TYLENOL) 325 MG tablet Take 2 tablets (650 mg total) by mouth every 6 (six) hours as needed for fever, headache, mild pain or moderate pain. 06/04/17   Lars Pinks M, PA-C  atorvastatin (LIPITOR) 80 MG tablet TAKE 1 TABLET BY MOUTH DAILY AT 6 PM. PLEASE MAKE YEARLY APPT FOR MARCH FOR FUTURE REFILLS 11/05/18   Belva Crome, MD  clopidogrel (PLAVIX) 75 MG tablet Take 1 tablet (75 mg total) by mouth daily. 11/01/18   Belva Crome, MD  diphenhydrAMINE (BENADRYL) 25 MG tablet Take 50 mg by mouth every 6 (six) hours as needed for allergies.    [provider]  DULoxetine (CYMBALTA) 60 MG capsule Take 60 mg by mouth daily. 10/05/18   [provider]  gabapentin (NEURONTIN) 300 MG capsule Take 300 mg by mouth 2 (two) times daily. 10/05/18   [provider]  hydrOXYzine (ATARAX/VISTARIL) 50 MG tablet TAKE 1 TABLET (50 MG) BY MOUTH EVERY NIGHT AS NEEDED 11/21/18   [provider]  Multiple Vitamin (MULTIVITAMIN WITH MINERALS) TABS tablet Take 1 tablet by mouth at bedtime.     [provider]  nitroGLYCERIN (NITROSTAT) 0.4 MG SL tablet Place 1 tablet (0.4 mg total) under the tongue every 5 (five) minutes as needed for chest pain. 11/01/18 01/30/19  Belva Crome, MD  pentosan polysulfate (ELMIRON) 100 MG capsule Take 200 mg by mouth 2 (two) times daily as needed (side pains).  04/09/17   [provider]  Probiotic Product (PROBIOTIC PO) Take 1 capsule by mouth at bedtime. [employer] brand    [provider]    Family History Family History  Problem Relation Age of Onset  . Cancer Paternal Grandfather        Esophageal  . Coronary artery disease Mother   . Diabetes Mother   . Hypertension Mother   . Dementia Father   . Sudden death Brother        Suicide  . Colon cancer Neg Hx     Social History Social History   Tobacco Use  . Smoking status: Current Some Day Smoker    Packs/day: 0.25    Years: 50.00    Pack years: 12.50     Types: Cigarettes  . Smokeless tobacco: Never Used  Substance Use Topics  . Alcohol use: No    Alcohol/week: 0.0 standard drinks  . Drug use: No     Allergies   Oxycodone hcl, Penicillins, Tramadol, Aspirin, Macrobid [nitrofurantoin monohyd macro], and Sulfa antibiotics   Review of Systems Review of Systems  Constitutional: Negative for appetite change.  HENT: Positive for congestion.  Respiratory: Positive for cough and shortness of breath.   Cardiovascular: Negative for chest pain.  Gastrointestinal: Negative for abdominal pain.  Genitourinary: Negative for flank pain.  Musculoskeletal: Negative for back pain.  Skin: Negative for rash.  Neurological: Positive for weakness.     Physical Exam Updated Vital Signs BP (!) 120/106 (BP Location: Right Arm)   Pulse 74   Temp 98 F (36.7 C) (Oral)   Resp 18   LMP  (LMP Unknown)   SpO2 98%   Physical Exam Vitals signs and nursing note reviewed.  HENT:     Head: Atraumatic.  Neck:     Musculoskeletal: Neck supple.  Cardiovascular:     Rate and Rhythm: Regular rhythm.  Pulmonary:     Breath sounds: No wheezing, rhonchi or rales.  Chest:     Chest wall: No tenderness.  Musculoskeletal:     Right lower leg: She exhibits no tenderness.     Left lower leg: She exhibits no tenderness.     Comments: Mild edema bilateral lower legs.  Skin:    Capillary Refill: Capillary refill takes less than 2 seconds.  Neurological:     Mental Status: She is alert and oriented to person, place, and time.      ED Treatments / Results  Labs (all labs ordered are listed, but only abnormal results are displayed) Labs Reviewed  CBC WITH DIFFERENTIAL/PLATELET  BASIC METABOLIC PANEL  URINALYSIS, ROUTINE W REFLEX MICROSCOPIC  TROPONIN I (HIGH SENSITIVITY)    EKG None  Radiology Dg Chest 2 View  Result Date: 03/28/2019 CLINICAL DATA:  Cough and shortness of breath EXAM: CHEST - 2 VIEW COMPARISON:  October 27, 2018 FINDINGS: The  lungs are clear without focal consolidation, edema, effusion or pneumothorax. Cardiomediastinal silhouette is within normal limits for size. Overlying median sternotomy wires are present. No acute osseous findings. IMPRESSION: No acute cardiopulmonary disease. Electronically Signed   By: Prudencio Pair M.D.   On: 03/28/2019 17:23    Procedures Procedures (including critical care time)  Medications Ordered in ED Medications - No data to display   Initial Impression / Assessment and Plan / ED Course  I have reviewed the triage vital signs and the nursing notes.  Pertinent labs & imaging results that were available during my care of the patient were reviewed by me and considered in my medical decision making (see chart for details).        Patient with shortness of breath.  Work-up overall reassuring.  EKG reassuring.  Enzymes negative.  X-ray does not show pneumonia.  Not hypoxic.  Doubt this is an anginal equivalent although she does have cardiac history.  Troponin negative x2.  Discharge home with outpatient follow-up.  Final Clinical Impressions(s) / ED Diagnoses   Final diagnoses:  None    ED Discharge Orders    None       Davonna Belling, MD 03/28/19 2316

## 2019-03-28 NOTE — Telephone Encounter (Signed)
See note

## 2019-03-28 NOTE — ED Triage Notes (Signed)
Pt reports since Saturday she has had nasal congestion, cough, and "more breathing/SOB". Pt reports feeling fatigued.

## 2019-03-28 NOTE — Discharge Instructions (Addendum)
You had a COVID test done here.  The results should be available tomorrow.

## 2019-03-29 LAB — SARS CORONAVIRUS 2 (TAT 6-24 HRS): SARS Coronavirus 2: NEGATIVE

## 2019-03-29 NOTE — Telephone Encounter (Signed)
Pt seen in ED.  Nothing further needed.

## 2019-04-19 DIAGNOSIS — M48061 Spinal stenosis, lumbar region without neurogenic claudication: Secondary | ICD-10-CM | POA: Diagnosis not present

## 2019-05-16 DIAGNOSIS — F332 Major depressive disorder, recurrent severe without psychotic features: Secondary | ICD-10-CM | POA: Diagnosis not present

## 2019-05-19 ENCOUNTER — Other Ambulatory Visit: Payer: Self-pay

## 2019-05-19 DIAGNOSIS — Z20828 Contact with and (suspected) exposure to other viral communicable diseases: Secondary | ICD-10-CM | POA: Diagnosis not present

## 2019-05-19 DIAGNOSIS — Z20822 Contact with and (suspected) exposure to covid-19: Secondary | ICD-10-CM

## 2019-05-20 LAB — NOVEL CORONAVIRUS, NAA: SARS-CoV-2, NAA: NOT DETECTED

## 2019-06-10 ENCOUNTER — Telehealth (INDEPENDENT_AMBULATORY_CARE_PROVIDER_SITE_OTHER): Payer: PPO | Admitting: Adult Health

## 2019-06-10 ENCOUNTER — Other Ambulatory Visit: Payer: Self-pay

## 2019-06-10 DIAGNOSIS — J069 Acute upper respiratory infection, unspecified: Secondary | ICD-10-CM | POA: Diagnosis not present

## 2019-06-10 MED ORDER — DOXYCYCLINE HYCLATE 100 MG PO CAPS: 100.0000 mg | ORAL_CAPSULE | Freq: Two times a day (BID) | ORAL | 0 refills | Status: DC

## 2019-06-10 NOTE — Progress Notes (Signed)
Virtual Visit via Telephone Note  I connected with Bianca [name] on 06/10/19 at  1:00 PM EDT by telephone and verified that I am speaking with the correct person using two identifiers.   I discussed the limitations, risks, security and privacy concerns of performing an evaluation and management service by telephone and the availability of in person appointments. I also discussed with the patient that there may be a patient responsible charge related to this service. The patient expressed understanding and agreed to proceed.  Location patient: home Location provider: work or home office Participants present for the call: patient, provider Patient did not have a visit in the prior 7 days to address this/these issue(s).   History of Present Illness: This 73 year old female who is being evaluated today for an acute issue.  Her symptoms started 2 to 3 weeks ago and have been waxing and waning.  In terms include runny nose with discolored mucus, sinus pain and pressure, headaches, body aches, and generalized fatigue.  She also endorses a semiproductive cough.  She denies fevers or chills.  She was tested for Covid in early October and tested negative.  She has not been around anybody that has been sick or tested positive for Covid since that time frame.   Observations/Objective: Patient sounds cheerful and well on the phone. I do not appreciate any SOB. Speech and thought processing are grossly intact. Patient reported vitals:  Assessment and Plan: 1. Upper respiratory tract infection, unspecified type - doxycycline (VIBRAMYCIN) 100 MG capsule; Take 1 capsule (100 mg total) by mouth 2 (two) times daily.  Dispense: 14 capsule; Refill: 0   Follow Up Instructions:  I did not refer this patient for an OV in the next 24 hours for this/these issue(s).  I discussed the assessment and treatment plan with the patient. The patient was provided an opportunity to ask questions and all were answered.  The patient agreed with the plan and demonstrated an understanding of the instructions.   The patient was advised to call back or seek an in-person evaluation if the symptoms worsen or if the condition fails to improve as anticipated.  I provided 9minutes of non-face-to-face time during this encounter.   [name], NP

## 2019-06-30 DIAGNOSIS — M5416 Radiculopathy, lumbar region: Secondary | ICD-10-CM | POA: Diagnosis not present

## 2019-07-06 DIAGNOSIS — M79641 Pain in right hand: Secondary | ICD-10-CM | POA: Diagnosis not present

## 2019-07-06 DIAGNOSIS — Z4789 Encounter for other orthopedic aftercare: Secondary | ICD-10-CM | POA: Diagnosis not present

## 2019-07-06 DIAGNOSIS — G5601 Carpal tunnel syndrome, right upper limb: Secondary | ICD-10-CM | POA: Diagnosis not present

## 2019-07-12 ENCOUNTER — Other Ambulatory Visit: Payer: Self-pay

## 2019-07-12 ENCOUNTER — Telehealth (INDEPENDENT_AMBULATORY_CARE_PROVIDER_SITE_OTHER): Payer: PPO | Admitting: Adult Health

## 2019-07-12 DIAGNOSIS — Z20822 Contact with and (suspected) exposure to covid-19: Secondary | ICD-10-CM

## 2019-07-12 DIAGNOSIS — Z20828 Contact with and (suspected) exposure to other viral communicable diseases: Secondary | ICD-10-CM

## 2019-07-12 NOTE — Progress Notes (Signed)
Virtual Visit via Telephone Note  I connected with Bianca [name] on 07/12/19 at  5:00 PM EST by telephone and verified that I am speaking with the correct person using two identifiers.   I discussed the limitations, risks, security and privacy concerns of performing an evaluation and management service by telephone and the availability of in person appointments. I also discussed with the patient that there may be a patient responsible charge related to this service. The patient expressed understanding and agreed to proceed.  Location patient: home Location provider: work or home office Participants present for the call: patient, provider Patient did not have a visit in the prior 7 days to address this/these issue(s).   History of Present Illness: 73 year old female who is being evaluated today for concerns of COVID-19.  Her symptoms started 2 to 3 days ago.  Her symptoms include fatigue, subjective fevers, chills, diaphoresis, loss of taste and smell, sinus congestion, and chest congestion.  She denies shortness of breath or chest pain.  Has not had any diarrhea.  She has not been exposed to anybody that possibly could have Covid   Observations/Objective: Patient sounds cheerful and well on the phone. I do not appreciate any SOB. Speech and thought processing are grossly intact. Patient reported vitals:  Assessment and Plan: 1. Suspected COVID-19 virus infection -Encouraged conservative care.  We will get Covid testing done.  She was advised to self quarantine until lab has resulted - Novel Coronavirus, NAA ([facility])   Follow Up Instructions:  I did not refer this patient for an OV in the next 24 hours for this/these issue(s).  I discussed the assessment and treatment plan with the patient. The patient was provided an opportunity to ask questions and all were answered. The patient agreed with the plan and demonstrated an understanding of the instructions.   The patient was advised  to call back or seek an in-person evaluation if the symptoms worsen or if the condition fails to improve as anticipated.  I provided 11 minutes of non-face-to-face time during this encounter.   [name], NP

## 2019-07-13 ENCOUNTER — Other Ambulatory Visit: Payer: Self-pay

## 2019-07-13 DIAGNOSIS — Z20822 Contact with and (suspected) exposure to covid-19: Secondary | ICD-10-CM

## 2019-07-15 LAB — NOVEL CORONAVIRUS, NAA: SARS-CoV-2, NAA: NOT DETECTED

## 2019-07-18 ENCOUNTER — Other Ambulatory Visit: Payer: Self-pay | Admitting: Adult Health

## 2019-07-18 NOTE — Telephone Encounter (Unsigned)
Copied from [facility] [phone]. Topic: Quick Communication - Rx Refill/Question >> Jul 18, 2019  3:17 PM Yvette Rack wrote: Medication: ondansetron (ZOFRAN) 4 MG tablet  Has the patient contacted their pharmacy? no  Preferred Pharmacy (with phone number or street name): [id number] [address], [place] [phone] (Phone)  [phone] (Fax)  Agent: Please be advised that RX refills may take up to 3 business days. We ask that you follow-up with your pharmacy.

## 2019-07-18 NOTE — Telephone Encounter (Signed)
Patient requesting medication on current med list.

## 2019-07-19 DIAGNOSIS — F332 Major depressive disorder, recurrent severe without psychotic features: Secondary | ICD-10-CM | POA: Diagnosis not present

## 2019-07-20 DIAGNOSIS — M961 Postlaminectomy syndrome, not elsewhere classified: Secondary | ICD-10-CM | POA: Diagnosis not present

## 2019-07-26 ENCOUNTER — Encounter: Payer: Self-pay | Admitting: Family Medicine

## 2019-08-17 ENCOUNTER — Encounter: Payer: Self-pay | Admitting: Family Medicine

## 2019-09-01 ENCOUNTER — Telehealth: Payer: Self-pay | Admitting: Adult Health

## 2019-09-01 NOTE — Telephone Encounter (Signed)
That is fine 

## 2019-09-01 NOTE — Telephone Encounter (Signed)
Patient is calling to ask Bianca [name] if she can leave a urine specimen she believes she has a bladder infection symptoms include-vaginal burning and cramping. Patient was advised that Cory's next available appt was next week. She was offer an appt with another provider. Patient declined. Requesting to to leave urine specimen. Please advise with the patient. CB- [phone]

## 2019-09-01 NOTE — Telephone Encounter (Signed)
Please advise. As you no opening tomorrow either

## 2019-09-01 NOTE — Telephone Encounter (Signed)
Spoke with patient. Informed her she can come tomorrow to give a urine specimen. Informed her she needs to come between 8am-2pm. Patient verbalized understanding.

## 2019-09-02 ENCOUNTER — Other Ambulatory Visit (INDEPENDENT_AMBULATORY_CARE_PROVIDER_SITE_OTHER): Payer: PPO

## 2019-09-02 DIAGNOSIS — N949 Unspecified condition associated with female genital organs and menstrual cycle: Secondary | ICD-10-CM

## 2019-09-02 LAB — POC URINALSYSI DIPSTICK (AUTOMATED)
Bilirubin, UA: NEGATIVE
Blood, UA: NEGATIVE
Glucose, UA: NEGATIVE
Ketones, UA: NEGATIVE
Leukocytes, UA: NEGATIVE
Nitrite, UA: NEGATIVE
Protein, UA: NEGATIVE
Spec Grav, UA: 1.015 (ref 1.010–1.025)
Urobilinogen, UA: 0.2 E.U./dL
pH, UA: 6 (ref 5.0–8.0)

## 2019-09-02 NOTE — Progress Notes (Signed)
poc

## 2019-09-03 LAB — URINE CULTURE
MICRO NUMBER:: 10047078
SPECIMEN QUALITY:: ADEQUATE

## 2019-09-06 ENCOUNTER — Telehealth: Payer: Self-pay | Admitting: Adult Health

## 2019-09-06 NOTE — Telephone Encounter (Signed)
Patient requesting call back to discuss urine results. Not specified if [facility] can release.

## 2019-09-07 ENCOUNTER — Encounter: Payer: Self-pay | Admitting: Adult Health

## 2019-09-07 ENCOUNTER — Other Ambulatory Visit: Payer: Self-pay

## 2019-09-07 ENCOUNTER — Telehealth (INDEPENDENT_AMBULATORY_CARE_PROVIDER_SITE_OTHER): Payer: PPO | Admitting: Adult Health

## 2019-09-07 DIAGNOSIS — J988 Other specified respiratory disorders: Secondary | ICD-10-CM | POA: Diagnosis not present

## 2019-09-07 MED ORDER — DOXYCYCLINE HYCLATE 100 MG PO CAPS: 100.0000 mg | ORAL_CAPSULE | Freq: Two times a day (BID) | ORAL | 0 refills | Status: AC

## 2019-09-07 NOTE — Telephone Encounter (Signed)
Spoke to pt and she just wanted to confirmed that her Urine culture was fine. I confirmed. No further action needed.

## 2019-09-07 NOTE — Progress Notes (Signed)
Virtual Visit via Telephone Note  I connected with Bianca [name] on 09/07/19 at  9:30 AM EST by telephone and verified that I am speaking with the correct person using two identifiers.   I discussed the limitations, risks, security and privacy concerns of performing an evaluation and management service by telephone and the availability of in person appointments. I also discussed with the patient that there may be a patient responsible charge related to this service. The patient expressed understanding and agreed to proceed.  Location patient: home Location provider: work or home office Participants present for the call: patient, provider Patient did not have a visit in the prior 7 days to address this/these issue(s).   History of Present Illness: 74 year old female who is being evaluated today for complaint of respiratory infection.  Her symptoms have been waxing and waning since we last spoke on July 12, 2019, at this time she was experiencing fatigue, subjective fevers, chills diaphoresis, loss of taste or smell, sinus congestion, and chest congestion.  Was sent for Covid 19 testing and that result was negative.  Since that time she continues to have fatigue, intermittent subjective fevers and chills, chest congestion, sinus congestion with discolored mucus.  She has a semiproductive cough.  As mentioned above her symptoms have been waxing and waning but has not resolved completely and over the last week her symptoms have started to get worse.  She is not using any over-the-counter medications at this time   Observations/Objective: Patient sounds cheerful and well on the phone. I do not appreciate any SOB. Speech and thought processing are grossly intact. Patient reported vitals:  Assessment and Plan: 1. Respiratory infection -We will treat due to waxing and waning nature as well as symptoms.  Was advised to drink plenty of water and rest.  Follow-up if symptoms have not resolved by  the time her antibiotics finish - doxycycline (VIBRAMYCIN) 100 MG capsule; Take 1 capsule (100 mg total) by mouth 2 (two) times daily for 10 days.  Dispense: 20 capsule; Refill: 0   Follow Up Instructions:   I did not refer this patient for an OV in the next 24 hours for this/these issue(s).  I discussed the assessment and treatment plan with the patient. The patient was provided an opportunity to ask questions and all were answered. The patient agreed with the plan and demonstrated an understanding of the instructions.   The patient was advised to call back or seek an in-person evaluation if the symptoms worsen or if the condition fails to improve as anticipated.  I provided 20  minutes of non-face-to-face time during this encounter.   Bianca Peng, NP

## 2019-10-04 DIAGNOSIS — M25552 Pain in left hip: Secondary | ICD-10-CM | POA: Diagnosis not present

## 2019-10-04 DIAGNOSIS — Z79899 Other long term (current) drug therapy: Secondary | ICD-10-CM | POA: Diagnosis not present

## 2019-10-04 DIAGNOSIS — G894 Chronic pain syndrome: Secondary | ICD-10-CM | POA: Diagnosis not present

## 2019-10-04 DIAGNOSIS — Z5181 Encounter for therapeutic drug level monitoring: Secondary | ICD-10-CM | POA: Diagnosis not present

## 2019-10-04 DIAGNOSIS — Z79891 Long term (current) use of opiate analgesic: Secondary | ICD-10-CM | POA: Diagnosis not present

## 2019-10-07 ENCOUNTER — Other Ambulatory Visit (HOSPITAL_COMMUNITY): Payer: Self-pay | Admitting: Psychiatry

## 2019-10-07 DIAGNOSIS — F332 Major depressive disorder, recurrent severe without psychotic features: Secondary | ICD-10-CM

## 2019-10-10 DIAGNOSIS — F332 Major depressive disorder, recurrent severe without psychotic features: Secondary | ICD-10-CM | POA: Diagnosis not present

## 2019-11-01 DIAGNOSIS — Z79891 Long term (current) use of opiate analgesic: Secondary | ICD-10-CM | POA: Diagnosis not present

## 2019-11-01 DIAGNOSIS — M961 Postlaminectomy syndrome, not elsewhere classified: Secondary | ICD-10-CM | POA: Diagnosis not present

## 2019-11-01 DIAGNOSIS — M48061 Spinal stenosis, lumbar region without neurogenic claudication: Secondary | ICD-10-CM | POA: Diagnosis not present

## 2019-11-07 ENCOUNTER — Other Ambulatory Visit: Payer: Self-pay | Admitting: Interventional Cardiology

## 2019-11-07 DIAGNOSIS — F332 Major depressive disorder, recurrent severe without psychotic features: Secondary | ICD-10-CM | POA: Diagnosis not present

## 2019-11-09 ENCOUNTER — Telehealth: Payer: Self-pay | Admitting: Adult Health

## 2019-11-09 NOTE — Progress Notes (Signed)
°  Chronic Care Management   Note  11/09/2019 Name: Bianca [name] MRN: [id number] DOB: Mar 06, 1946  Bianca [name] is a 75 y.o. year old female who is a primary care patient of [name], NP. I reached out to [name] by phone today in response to a referral sent by [name]'s PCP, [name], NP.   [name] was given information about Chronic Care Management services today including:  1. CCM service includes personalized support from designated clinical staff supervised by her physician, including individualized plan of care and coordination with other care providers 2. 24/7 contact phone numbers for assistance for urgent and routine care needs. 3. Service will only be billed when office clinical staff spend 20 minutes or more in a month to coordinate care. 4. Only one practitioner may furnish and bill the service in a calendar month. 5. The patient may stop CCM services at any time (effective at the end of the month) by phone call to the office staff.   Patient agreed to services and verbal consent obtained.   Follow up plan:   Raynicia Dukes [employer] Scheduler

## 2019-11-12 ENCOUNTER — Other Ambulatory Visit (HOSPITAL_COMMUNITY): Payer: Self-pay | Admitting: Psychiatry

## 2019-11-12 DIAGNOSIS — F332 Major depressive disorder, recurrent severe without psychotic features: Secondary | ICD-10-CM

## 2019-11-24 DIAGNOSIS — R0602 Shortness of breath: Secondary | ICD-10-CM | POA: Diagnosis not present

## 2019-11-24 DIAGNOSIS — J22 Unspecified acute lower respiratory infection: Secondary | ICD-10-CM | POA: Diagnosis not present

## 2019-11-24 DIAGNOSIS — R102 Pelvic and perineal pain: Secondary | ICD-10-CM | POA: Diagnosis not present

## 2019-12-01 ENCOUNTER — Telehealth: Payer: PPO

## 2019-12-02 ENCOUNTER — Other Ambulatory Visit: Payer: Self-pay | Admitting: Interventional Cardiology

## 2019-12-05 DIAGNOSIS — F332 Major depressive disorder, recurrent severe without psychotic features: Secondary | ICD-10-CM | POA: Diagnosis not present

## 2019-12-20 ENCOUNTER — Telehealth: Payer: Self-pay | Admitting: *Deleted

## 2019-12-20 DIAGNOSIS — R05 Cough: Secondary | ICD-10-CM | POA: Diagnosis not present

## 2019-12-20 DIAGNOSIS — J40 Bronchitis, not specified as acute or chronic: Secondary | ICD-10-CM | POA: Diagnosis not present

## 2019-12-20 DIAGNOSIS — J329 Chronic sinusitis, unspecified: Secondary | ICD-10-CM | POA: Diagnosis not present

## 2019-12-20 NOTE — Telephone Encounter (Signed)
Called and spoke to the pt.  She stated she was on the way to the UC.  Offered a virtual appointment with Tommi Rumps today but she declined.  Nothing further needed.

## 2019-12-20 NOTE — Telephone Encounter (Signed)
Patient spoke with nurse triage after hours on 12/19/2019 at 5:33 PM. Patient  reports she is aching, chest coughing, nose stuffy, and facial pain.  Patient  reports she had abx for 5 days and finished two weeks ago and still has the same symptoms. Nurse triage referred patient to PCP for appointment

## 2019-12-28 ENCOUNTER — Telehealth: Payer: Self-pay | Admitting: Adult Health

## 2019-12-28 NOTE — Telephone Encounter (Signed)
baclofen (LIORESAL) 10 MG tablet  There was another medication that [facility] prescribed for her stomach back in 2019 and now she needs that medication refilled but don't know the name of the medication.  [id number] [address], [place] Phone:  [phone]  Fax:  [phone]

## 2019-12-29 ENCOUNTER — Other Ambulatory Visit: Payer: Self-pay | Admitting: Adult Health

## 2019-12-29 MED ORDER — PANTOPRAZOLE SODIUM 40 MG PO TBEC: 40.0000 mg | DELAYED_RELEASE_TABLET | Freq: Every day | ORAL | 0 refills | Status: DC

## 2019-12-29 NOTE — Telephone Encounter (Signed)
Left a message for a return call.

## 2019-12-29 NOTE — Telephone Encounter (Signed)
Protonix sent in   Baclofen has not been prescribed since 2019 - will need visit either in person or virtually to discuss prescribing that again

## 2019-12-30 NOTE — Telephone Encounter (Signed)
Spoke to the pt and informed her of the below message.  She has picked up the Protonix prescription.  She also called her ortho provider and he/she sent in the Baclofen.  Nothing further needed.

## 2020-01-03 ENCOUNTER — Telehealth: Payer: Self-pay | Admitting: Interventional Cardiology

## 2020-01-03 DIAGNOSIS — F332 Major depressive disorder, recurrent severe without psychotic features: Secondary | ICD-10-CM | POA: Diagnosis not present

## 2020-01-03 MED ORDER — CLOPIDOGREL BISULFATE 75 MG PO TABS: 75.0000 mg | ORAL_TABLET | Freq: Every day | ORAL | 2 refills | Status: DC

## 2020-01-03 NOTE — Telephone Encounter (Signed)
Pt's medication was sent to pt's pharmacy as requested. Confirmation received.  °

## 2020-01-03 NOTE — Telephone Encounter (Signed)
New Message    *STAT* If patient is at the pharmacy, call can be transferred to refill team.   1. Which medications need to be refilled? (please list name of each medication and dose if known) clopidogrel (PLAVIX) 75 MG tablet   2. Which pharmacy/location (including street and city if local pharmacy) is medication to be sent to? [facility] [phone]  3. Do they need a 30 day or 90 day supply? [place]

## 2020-01-05 DIAGNOSIS — Z96641 Presence of right artificial hip joint: Secondary | ICD-10-CM | POA: Diagnosis not present

## 2020-01-05 DIAGNOSIS — M25552 Pain in left hip: Secondary | ICD-10-CM | POA: Diagnosis not present

## 2020-01-10 ENCOUNTER — Telehealth: Payer: Self-pay

## 2020-01-10 NOTE — Telephone Encounter (Signed)
   [facility] Pre-operative Risk Assessment    HEARTCARE STAFF: - Please ensure there is not already an duplicate clearance open for this procedure. - Under Visit Info/Reason for Call, type in Other and utilize the format Clearance MM/DD/YY or Clearance TBD. Do not use dashes or single digits. - If request is for dental extraction, please clarify the # of teeth to be extracted.  Request for surgical clearance:  1. What type of surgery is being performed? LEFT TOTAL HIP ARTHROPLASTY  2. When is this surgery scheduled? 02/29/20   3. What type of clearance is required (medical clearance vs. Pharmacy clearance to hold med vs. Both)? BOTH  4. Are there any medications that need to be held prior to surgery and how long? PLAVIX   5. Practice name and name of physician performing surgery? [facility]; DR. FRANK ALUISIO  6. What is the office phone number? [phone]   7.   What is the office fax number? [phone]  8.   Anesthesia type (None, local, MAC, general) ? CHOICE   Jacinta Shoe 01/10/2020, 4:37 PM  _________________________________________________________________   (provider comments below)

## 2020-01-11 NOTE — Telephone Encounter (Signed)
Pt has a pre-op clearance appt scheduled 01/23/2020 3:45 PM w/Jill Glenford Peers, NP

## 2020-01-11 NOTE — Telephone Encounter (Signed)
Primary Cardiologist:Henry Nicholes Stairs III, MD  Chart reviewed as part of pre-operative protocol coverage. Because of Bianca [name]'s past medical history and time since last visit, he/she will require a follow-up visit in order to better assess preoperative cardiovascular risk.  Pre-op covering staff: - Please schedule appointment and call patient to inform them. - Please contact requesting surgeon's office via preferred method (i.e, phone, fax) to inform them of need for appointment prior to surgery.  If applicable, this message will also be routed to pharmacy pool and/or primary cardiologist for input on holding anticoagulant/antiplatelet agent as requested below so that this information is available at time of patient's appointment.   Deberah Pelton, NP  01/11/2020, 8:58 AM

## 2020-01-12 ENCOUNTER — Encounter: Payer: Self-pay | Admitting: Internal Medicine

## 2020-01-12 ENCOUNTER — Other Ambulatory Visit: Payer: Self-pay

## 2020-01-12 ENCOUNTER — Telehealth (INDEPENDENT_AMBULATORY_CARE_PROVIDER_SITE_OTHER): Payer: PPO | Admitting: Internal Medicine

## 2020-01-12 VITALS — BP 120/70 | Ht 63.0 in | Wt 165.0 lb

## 2020-01-12 DIAGNOSIS — J069 Acute upper respiratory infection, unspecified: Secondary | ICD-10-CM | POA: Diagnosis not present

## 2020-01-12 DIAGNOSIS — Z720 Tobacco use: Secondary | ICD-10-CM

## 2020-01-12 NOTE — Progress Notes (Signed)
   Virtual Visit via Telephone Note  I connected with@ on 01/12/20 at  8:30 AM EDT by telephone and verified that I am speaking with the correct person using two identifiers.   I discussed the limitations, risks, security and privacy concerns of performing an evaluation and management service by telephone and the limited availability of in person appointments. tThere may be a patient responsible charge related to this service. The patient expressed understanding and agreed to proceed.  Location patient: home Location provider:r home office Participants present for the call: patient, provider Patient did not have a visit in the prior 7 days to address this/these issue(s).   History of Present Illness: Bianca [name]  Presents with cough ur congestion  achiness .  Began feeling badly 5 days ago and then developed cough and congestion  Dry cough  Using mucinex  Day and night   Is a reg smoker  But stopped for the illness . No inc sob pleurisy or  Cp.  No fever chills but feels under the weather .    No hx of copd asthma  Or PNA.   Had covid vaccine  finished April.   has had cxray in past year no hx allergy or pets .  Observations/Objective: Patient sounds personable and well on the phone. I do not appreciate any SOB. Speech and thought processing are grossly intact. Sounds like some sniffles  Nl speech Patient reported vitals:   Assessment and Plan: Resp  infection c/w viral in a long time smoker  No  Alarm sx but  At risk   Immunized covid  Disc sx of pna or emergent eval.    Should be feeling better after the weekend although may cough for another week or so   And encouraged to avoid tobacco .    Follow Up Instructions: Continue self care mucinex etc ok  Offered cough med and albuterol but prob not helpful than what she is doing  contact pcp  After weekend if not beginning to improve or worse and when to seek care .  Cough may last 2+ weeks but should feel better  I did not refer this  patient for an OV in the next 24 hours for this/these issue(s).  I discussed the assessment and treatment plan with the patient. The patient was provided an opportunity to ask questions and answered. The patient agreed with the plan and demonstrated an understanding of the instructions.   The patient was advised to call back or seek an in-person evaluation if the symptoms worsen or if the condition fails to improve as anticipated.  I provided 14 minutes of non-face-to-face time during this encounter. Return if symptoms worsen or fail to improve as expected.  Shanon Ace, MD

## 2020-01-19 ENCOUNTER — Other Ambulatory Visit: Payer: Self-pay

## 2020-01-19 NOTE — Progress Notes (Deleted)
Cardiology Office Note   Date:  01/19/2020   ID:  Bianca [name], DOB [birth year], MRN [id number]  PCP:  Bianca Peng, NP  Cardiologist:Dr. Tamala Julian, MD   No chief complaint on file.   History of Present Illness: Bianca [name] is a 74 y.o. female who presents for preoperative clearance for left hip arthroplasty, seen for Dr. Tamala Julian.   Ms. Safer has a hx of CAD with inferior MI in 1997. LHC in 2007 with PCI/DES to RCA, NSTEMI 2018, and ultimately underwent bypass revascularization in 2018 with LIMA to LAD, SVG to obtuse marginal, and SVG to PDA 05/2017.Had issues with post-operative AF and was placed on Amiodarone therapy.   She had recurrent chest pain 3/11/2020and presented to the ED>>pain had been present for 48-72hs. Nitroglycerin did not help. She was then seen in follow up with Dr. Tamala Julian and continued to have symptoms>>she was continued on secondary prevention as pain was not felt to be cardiac.   Stress test?    The patient does not have any unstable cardiac conditions.  Upon evaluation today, she can achieve 4 METs or greater without anginal symptoms.  According to [facility] and AHA guidelines, she requires no further cardiac workup prior to her noncardiac surgery and should be at acceptable risk.  her NSQIP risk of peri-procedural MI or cardiac arrest following knee replacement surgery is low.  Our service is available as necessary in the perioperative period.    1. CAD with prior CABG 2018: -   2. HLD: -Last LDL,  -Continue  3. Prior tobacco use: -No longer smokes    Past Medical History:  Diagnosis Date  . Arthritis   . Blood transfusion without reported diagnosis   . CAD (coronary artery disease)    a. 1997 MI/PCI RCA;  b. 2007 MI/PCI of 100% RCA with Taxus DES x 3 placed, EF 55%;  c. 2010 Cath: stable anatomy;  d. 05/2012 NSTEMI/Cath/PCI: LM nl, LAD 60-106m, D1 20ost, LCX 23m, RCA 40-71m ISR, 60/95d (Treated w/ 2.75x33 Xience Xpedition DES), PDA 26m (Treated w/  PTCA), EF 60%.  . Carpal tunnel syndrome on both sides   . Chronic lower back pain   . Depression   . GERD (gastroesophageal reflux disease)   . Hyperlipidemia   . Myocardial infarction [facility]) 1997, 2007, 2013  . Numbness of foot    Left foot - back surgery 2009    Past Surgical History:  Procedure Laterality Date  . ANKLE SURGERY  Years ago   Left; "had to take out a floater"  . CARPAL TUNNEL RELEASE Left 10/12016  . CARPAL TUNNEL RELEASE Right 02/21/2019   Emerge Lasandra Beech, MD  . [place]  . CORONARY ANGIOPLASTY WITH STENT PLACEMENT  1997; 2007, 2013   "2 + 2; + 1= total of 5  . CORONARY ARTERY BYPASS GRAFT N/A 05/26/2017   Procedure: CORONARY ARTERY BYPASS GRAFTING (CABG), ON PUMP, TIMES Three, using left internal mammary artery and right greater saphenous vein harvested endoscopically;  Surgeon: [name], MD;  Location: [facility];  Service: Open Heart Surgery;  Laterality: N/A;  LIMA to LAD, SVG to OM 1, SVG to PDA  . LEFT HEART CATH AND CORONARY ANGIOGRAPHY N/A 05/26/2017   Procedure: LEFT HEART CATH AND CORONARY ANGIOGRAPHY;  Surgeon: Belva Crome, MD;  Location: [facility] CV [facility];  Service: Cardiovascular;  Laterality: N/A;  . LEFT HEART CATHETERIZATION WITH CORONARY ANGIOGRAM N/A 05/18/2012   Procedure: LEFT HEART CATHETERIZATION  WITH CORONARY ANGIOGRAM;  Surgeon: Wellington Hampshire, MD;  Location: [facility] CATH LAB;  Service: Cardiovascular;  Laterality: N/A;  . [place]; 2009  . TEE WITHOUT CARDIOVERSION N/A 05/26/2017   Procedure: TRANSESOPHAGEAL ECHOCARDIOGRAM (TEE);  Surgeon: [name], MD;  Location: [facility];  Service: Open Heart Surgery;  Laterality: N/A;  . TOTAL HIP ARTHROPLASTY Right 05/24/2014   Procedure: RIGHT TOTAL HIP ARTHROPLASTY ANTERIOR APPROACH;  Surgeon: Gearlean Alf, MD;  Location: [facility];  Service: Orthopedics;  Laterality: Right;     Current Outpatient Medications  Medication Sig Dispense  Refill  . acetaminophen (TYLENOL) 325 MG tablet Take 2 tablets (650 mg total) by mouth every 6 (six) hours as needed for fever, headache, mild pain or moderate pain.    Marland Kitchen atorvastatin (LIPITOR) 80 MG tablet TAKE 1 TABLET BY MOUTH DAILY AT 6 PM. PLEASE MAKE YEARLY APPT FOR MARCH FOR FUTURE REFILLS 30 tablet 0  . clopidogrel (PLAVIX) 75 MG tablet Take 1 tablet (75 mg total) by mouth daily. Please keep upcoming appt in July with Dr. Tamala Julian before anymore refills. Thank you 30 tablet 2  . diphenhydrAMINE (BENADRYL) 25 MG tablet Take 50 mg by mouth every 6 (six) hours as needed for allergies.    . DULoxetine (CYMBALTA) 60 MG capsule Take 60 mg by mouth daily.    Marland Kitchen FLUoxetine (PROZAC) 10 MG capsule Take 30 mg by mouth daily.     Marland Kitchen gabapentin (NEURONTIN) 300 MG capsule Take 300 mg by mouth 2 (two) times daily.    . hydrOXYzine (ATARAX/VISTARIL) 50 MG tablet TAKE 1 TABLET (50 MG) BY MOUTH EVERY NIGHT AS NEEDED    . Multiple Vitamin (MULTIVITAMIN WITH MINERALS) TABS tablet Take 1 tablet by mouth at bedtime.     . nitroGLYCERIN (NITROSTAT) 0.4 MG SL tablet Place 1 tablet (0.4 mg total) under the tongue every 5 (five) minutes as needed for chest pain. 50 tablet 3  . oxyCODONE-acetaminophen (PERCOCET) 10-325 MG tablet Take 1 tablet by mouth every 8 (eight) hours as needed for pain.    . pantoprazole (PROTONIX) 40 MG tablet Take 1 tablet (40 mg total) by mouth daily. 90 tablet 0  . pentosan polysulfate (ELMIRON) 100 MG capsule Take 200 mg by mouth 2 (two) times daily as needed (side pains).     . Probiotic Product (PROBIOTIC PO) Take 1 capsule by mouth at bedtime. [employer]'s brand    . traZODone (DESYREL) 150 MG tablet Take 150 mg by mouth at bedtime.     No current facility-administered medications for this visit.    Allergies:   Oxycodone hcl, Penicillins, Tramadol, Aspirin, Macrobid [nitrofurantoin monohyd macro], and Sulfa antibiotics    Social History:  The patient  reports that she has been  smoking cigarettes. She has a 12.50 pack-year smoking history. She has never used smokeless tobacco. She reports that she does not drink alcohol or use drugs.   Family History:  The patient's ***family history includes Cancer in her paternal grandfather; Coronary artery disease in her mother; Dementia in her father; Diabetes in her mother; Hypertension in her mother; Sudden death in her brother.    ROS:  Please see the history of present illness.   Otherwise, review of systems are positive for {NONE DEFAULTED:18576::"none"}.   All other systems are reviewed and negative.    PHYSICAL EXAM: VS:  LMP  (LMP Unknown)  , BMI There is no height or weight on file to calculate BMI. GEN: Well  nourished, well developed, in no acute distress HEENT: normal Neck: no JVD, carotid bruits, or masses Cardiac: ***RRR; no murmurs, rubs, or gallops,no edema  Respiratory:  clear to auscultation bilaterally, normal work of breathing GI: soft, nontender, nondistended, + BS MS: no deformity or atrophy Skin: warm and dry, no rash Neuro:  Strength and sensation are intact Psych: euthymic mood, full affect   EKG:  EKG {ACTION; IS/IS [id number] ordered today. The ekg ordered today demonstrates ***   Recent Labs: 03/28/2019: BUN 7; Creatinine, Ser 0.79; Hemoglobin 13.7; Platelets 258; Potassium 3.9; Sodium 138    Lipid Panel    Component Value Date/Time   CHOL 233 (H) 10/31/2016 0915   TRIG 316.0 (H) 10/31/2016 0915   HDL 48.80 10/31/2016 0915   CHOLHDL 5 10/31/2016 0915   VLDL 63.2 (H) 10/31/2016 0915   LDLCALC 106 (H) 01/03/2014 0939   LDLDIRECT 129.0 10/31/2016 0915      Wt Readings from Last 3 Encounters:  01/12/20 165 lb (74.8 kg)  01/05/19 169 lb (76.7 kg)  11/03/18 171 lb (77.6 kg)      Other studies Reviewed: Additional studies/ records that were reviewed today include: ***. Review of the above records demonstrates: ***   ASSESSMENT AND PLAN:  1.  ***   Current medicines are  reviewed at length with the patient today.  The patient {ACTIONS; HAS/DOES NOT HAVE:19233} concerns regarding medicines.  The following changes have been made:  {PLAN; NO CHANGE:13088:s}  Labs/ tests ordered today include: *** No orders of the defined types were placed in this encounter.    Disposition:   FU with *** in {gen number [id number] {Days to years:10300}  Signed, Kathyrn Drown, NP  01/19/2020 1:50 PM    [facility] [address], [place], [place]  [address] Phone: [phone]; Fax: [phone]

## 2020-01-20 ENCOUNTER — Ambulatory Visit: Payer: PPO | Admitting: Adult Health

## 2020-01-23 ENCOUNTER — Other Ambulatory Visit: Payer: Self-pay

## 2020-01-23 ENCOUNTER — Telehealth (INDEPENDENT_AMBULATORY_CARE_PROVIDER_SITE_OTHER): Payer: PPO | Admitting: Family Medicine

## 2020-01-23 ENCOUNTER — Ambulatory Visit: Payer: PPO | Admitting: Cardiology

## 2020-01-23 ENCOUNTER — Encounter: Payer: Self-pay | Admitting: Family Medicine

## 2020-01-23 VITALS — Ht 63.0 in

## 2020-01-23 DIAGNOSIS — R0609 Other forms of dyspnea: Secondary | ICD-10-CM

## 2020-01-23 DIAGNOSIS — R053 Chronic cough: Secondary | ICD-10-CM

## 2020-01-23 DIAGNOSIS — Z720 Tobacco use: Secondary | ICD-10-CM | POA: Diagnosis not present

## 2020-01-23 DIAGNOSIS — R05 Cough: Secondary | ICD-10-CM | POA: Diagnosis not present

## 2020-01-23 DIAGNOSIS — R0600 Dyspnea, unspecified: Secondary | ICD-10-CM

## 2020-01-23 DIAGNOSIS — J301 Allergic rhinitis due to pollen: Secondary | ICD-10-CM

## 2020-01-23 MED ORDER — ALBUTEROL SULFATE HFA 108 (90 BASE) MCG/ACT IN AERS: 2.0000 | INHALATION_SPRAY | Freq: Four times a day (QID) | RESPIRATORY_TRACT | 1 refills | Status: DC | PRN

## 2020-01-23 MED ORDER — FLUTICASONE PROPIONATE 50 MCG/ACT NA SUSP: 2.0000 | Freq: Every day | NASAL | 2 refills | Status: DC

## 2020-01-23 MED ORDER — BUDESONIDE-FORMOTEROL FUMARATE 160-4.5 MCG/ACT IN AERO: 2.0000 | INHALATION_SPRAY | Freq: Two times a day (BID) | RESPIRATORY_TRACT | 3 refills | Status: DC

## 2020-01-23 NOTE — Progress Notes (Signed)
Virtual Visit via Telephone Note  I connected with [name] on 01/23/20 at 12:00 PM EDT by telephone and verified that I am speaking with the correct person using two identifiers.   I discussed the limitations, risks, security and privacy concerns of performing an evaluation and management service by telephone and the availability of in person appointments. I also discussed with the patient that there may be a patient responsible charge related to this service. The patient expressed understanding and agreed to proceed.  Location patient: home Location provider: work office Participants present for the call: patient, provider Patient did not have a visit in the prior 7 days to address this/these issue(s).  Chief Complaint  Patient presents with  . Cough    off and on for 2 months, came back worse over the last two weeks   History of Present Illness: Ms. Costilow is a 74 year old female with a history of tobacco use, allergy rhinitis, cognitive impairment, CAD (s/p PCI), and GERD complaining of persistent cough. According to patient, she has had productive cough with clearish sputum for about 2 months. Problem is intermittent, 8 has been worse for the past 2 weeks. She denies hemoptysis. He seems to be exacerbated by exertion. Associated dyspnea but no wheezing.  Alleviated by rest. She is taking allergy Mucinex.  In the past antibiotic has been recommended, temporal improvement. Negative for sick contact. No fever, chills, abnormal weight loss, night sweats, sore throat, dysphagia, CP, orthopnea, PND, abdominal pain, vomiting, changes in bowel habits, or melena. Coughing spells cause nausea.  Negative for heartburn. Cough is not interfering with his sleep. She takes Protonix 40 mg daily for GERD.  + Nasal congestion, rhinorrhea, and postnasal drainage. History of allergy rhinitis, she has not been consistent with using Flonase nasal spray.  In 04/2016 she was evaluated by Dr.  Melvyn Novas, pulmonologist because SOB and chest tightness. Spirometry was performed and in normal range. ENT evaluation due to globus sensation, diagnosed with laryngopharyngeal reflux.  CXR on 03/28/2019, negative for acute cardiopulmonary process.  Observations/Objective: Patient sounds cheerful and well on the phone. I do not appreciate any SOB. No cough or wheezing appreciated during visit. Speech and thought processing are grossly intact. Patient reported vitals:Ht 5\' 3"  (1.6 m)   LMP  (LMP Unknown)   BMI 29.23 kg/m   Assessment and Plan: 1. DOE (dyspnea on exertion) Chronic. ? COPD/RAD,CAD among some to consider. Echo showed LVEF 50-55% in 05/2017. Cardiac cath in 2018: Severe 3 vessel coronary disease, no acute EKG changes. Instructed about warning signs. Smoking cessation strongly recommended.  2. Persistent cough We discussed possible etiologies. Upper endoscopy in 01/2017 was negative for esophagitis.  She agrees with trial of Symbicort 160-4.5 mcg twice daily. Albuterol inhaler 2 puffs every 6 hours daily for 7 days then as needed for cough and dyspnea.  3. Tobacco use She is not interested in pharmacologic treatment, states that due to cardiac disease she cannot use nicotine patches. We discussed adverse effects of tobacco use and benefits of smoking cessation.  4. Allergic rhinitis due to pollen, unspecified seasonality Could be contributing factor for cough. Recommend resuming Flonase nasal spray and to be consistent.  - fluticasone (FLONASE) 50 MCG/ACT nasal spray; Place 2 sprays into both nostrils daily.  Dispense: 16 g; Refill: 2  Follow Up Instructions:  Return in about 6 weeks (around 03/05/2020) for cough and SOB with PCP.  I did not refer this patient for an OV in the next 24 hours for this/these  issue(s).  I discussed the assessment and treatment plan with the patient. Ms Dinh was provided an opportunity to ask questions and all were answered. She agreed  with the plan and demonstrated an understanding of the instructions.    I provided 15-16 minutes of non-face-to-face time during this encounter.   Jannelle Notaro [place], MD

## 2020-01-24 ENCOUNTER — Telehealth: Payer: PPO | Admitting: Adult Health

## 2020-01-25 ENCOUNTER — Other Ambulatory Visit: Payer: Self-pay

## 2020-01-26 ENCOUNTER — Encounter: Payer: Self-pay | Admitting: Adult Health

## 2020-01-26 ENCOUNTER — Ambulatory Visit (INDEPENDENT_AMBULATORY_CARE_PROVIDER_SITE_OTHER): Payer: PPO | Admitting: Adult Health

## 2020-01-26 VITALS — BP 116/72 | Temp 97.7°F | Wt 167.0 lb

## 2020-01-26 DIAGNOSIS — Z01818 Encounter for other preprocedural examination: Secondary | ICD-10-CM | POA: Diagnosis not present

## 2020-01-26 NOTE — Patient Instructions (Signed)
It was great seeing you today   Please go to the [facility] office for labs and xray   Once I get everything back I will let you know what it shows and we can clear you for surgery

## 2020-01-26 NOTE — Progress Notes (Signed)
Subjective:    Patient ID: Bianca [name], female    DOB: [birth year], 74 y.o.   MRN: [id number]  HPI 74 year old female who  has a past medical history of Arthritis, Blood transfusion without reported diagnosis, CAD (coronary artery disease), Carpal tunnel syndrome on both sides, Chronic lower back pain, Depression, GERD (gastroesophageal reflux disease), Hyperlipidemia, Myocardial infarction ([place]) (1997, 2007, 2013), and Numbness of foot.  She presents to the office today for pre operative clearance. She will be having a left total hip replacement done on 02/29/2020 by Dr. Maureen Ralphs. She will need cardiac clearance due to history of CAD and MI and reports that she has this appointment coming up.   Review of Systems See HPI   Past Medical History:  Diagnosis Date  . Arthritis   . Blood transfusion without reported diagnosis   . CAD (coronary artery disease)    a. 1997 MI/PCI RCA;  b. 2007 MI/PCI of 100% RCA with Taxus DES x 3 placed, EF 55%;  c. 2010 Cath: stable anatomy;  d. 05/2012 NSTEMI/Cath/PCI: LM nl, LAD 60-43m, D1 20ost, LCX 37m, RCA 40-32m ISR, 60/95d (Treated w/ 2.75x33 Xience Xpedition DES), PDA 33m (Treated w/ PTCA), EF 60%.  . Carpal tunnel syndrome on both sides   . Chronic lower back pain   . Depression   . GERD (gastroesophageal reflux disease)   . Hyperlipidemia   . Myocardial infarction [facility]) 1997, 2007, 2013  . Numbness of foot    Left foot - back surgery 2009    Social History   Socioeconomic History  . Marital status: Married    Spouse name: Not on file  . Number of children: Not on file  . Years of education: Not on file  . Highest education level: Not on file  Occupational History  . Not on file  Tobacco Use  . Smoking status: Current Some Day Smoker    Packs/day: 0.25    Years: 50.00    Pack years: 12.50    Types: Cigarettes  . Smokeless tobacco: Never Used  Vaping Use  . Vaping Use: Never used  Substance and Sexual Activity  . Alcohol use: No     Alcohol/week: 0.0 standard drinks  . Drug use: No  . Sexual activity: Not Currently    Birth control/protection: Post-menopausal  Other Topics Concern  . Not on file  Social History Narrative   Is a homemaker   Married for 17 years    Has a daughter and son    Pets: Two dogs and a Neurosurgeon   Likes to garden ( flower beds).       Social Determinants of Health   Financial Resource Strain:   . Difficulty of Paying Living Expenses:   Food Insecurity:   . Worried About Charity fundraiser in the Last Year:   . Arboriculturist in the Last Year:   Transportation Needs:   . Film/video editor (Medical):   Marland Kitchen Lack of Transportation (Non-Medical):   Physical Activity:   . Days of Exercise per Week:   . Minutes of Exercise per Session:   Stress:   . Feeling of Stress :   Social Connections:   . Frequency of Communication with Friends and Family:   . Frequency of Social Gatherings with Friends and Family:   . Attends Religious Services:   . Active Member of Clubs or Organizations:   . Attends Archivist Meetings:   Marland Kitchen Marital Status:  Intimate Partner Violence:   . Fear of Current or Ex-Partner:   . Emotionally Abused:   Marland Kitchen Physically Abused:   . Sexually Abused:     Past Surgical History:  Procedure Laterality Date  . ANKLE SURGERY  Years ago   Left; "had to take out a floater"  . CARPAL TUNNEL RELEASE Left 10/12016  . CARPAL TUNNEL RELEASE Right 02/21/2019   Emerge Lasandra Beech, MD  . [place]  . CORONARY ANGIOPLASTY WITH STENT PLACEMENT  1997; 2007, 2013   "2 + 2; + 1= total of 5  . CORONARY ARTERY BYPASS GRAFT N/A 05/26/2017   Procedure: CORONARY ARTERY BYPASS GRAFTING (CABG), ON PUMP, TIMES Three, using left internal mammary artery and right greater saphenous vein harvested endoscopically;  Surgeon: [name], MD;  Location: [place];  Service: Open Heart Surgery;  Laterality: N/A;  LIMA to LAD, SVG to OM 1, SVG to PDA  . LEFT HEART  CATH AND CORONARY ANGIOGRAPHY N/A 05/26/2017   Procedure: LEFT HEART CATH AND CORONARY ANGIOGRAPHY;  Surgeon: Belva Crome, MD;  Location: [place] CV LAB;  Service: Cardiovascular;  Laterality: N/A;  . LEFT HEART CATHETERIZATION WITH CORONARY ANGIOGRAM N/A 05/18/2012   Procedure: LEFT HEART CATHETERIZATION WITH CORONARY ANGIOGRAM;  Surgeon: Wellington Hampshire, MD;  Location: [place] CATH LAB;  Service: Cardiovascular;  Laterality: N/A;  . [place]; 2009  . TEE WITHOUT CARDIOVERSION N/A 05/26/2017   Procedure: TRANSESOPHAGEAL ECHOCARDIOGRAM (TEE);  Surgeon: [name], MD;  Location: [place];  Service: Open Heart Surgery;  Laterality: N/A;  . TOTAL HIP ARTHROPLASTY Right 05/24/2014   Procedure: RIGHT TOTAL HIP ARTHROPLASTY ANTERIOR APPROACH;  Surgeon: Gearlean Alf, MD;  [facility];  Service: Orthopedics;  Laterality: Right;    Family History  Problem Relation Age of Onset  . Cancer Paternal Grandfather        Esophageal  . Coronary artery disease Mother   . Diabetes Mother   . Hypertension Mother   . Dementia Father   . Sudden death Brother        Suicide  . Colon cancer Neg Hx     Allergies  Allergen Reactions  . Oxycodone Hcl Itching and Other (See Comments)    Confusion, hallucinations  . Penicillins Itching and Rash    Has patient had a PCN reaction causing immediate rash, facial/tongue/throat swelling, SOB or lightheadedness with hypotension: yes rash that took a while to go away across the abdomen Has patient had a PCN reaction causing severe rash involving mucus membranes or skin necrosis: no Has patient had a PCN reaction that required hospitalization: no Has patient had a PCN reaction occurring within the last 10 years: No If all of the above answers are "NO", then may proceed with Cephalosporin use.   . Tramadol Itching and Other (See Comments)    hallucinations  . Aspirin Nausea And Vomiting and Other (See Comments)    Burning in  stomach- tolerates enteric coated  . Macrobid [Nitrofurantoin Monohyd Macro] Nausea And Vomiting  . Sulfa Antibiotics Nausea Only    Current Outpatient Medications on File Prior to Visit  Medication Sig Dispense Refill  . acetaminophen (TYLENOL) 325 MG tablet Take 2 tablets (650 mg total) by mouth every 6 (six) hours as needed for fever, headache, mild pain or moderate pain.    Marland Kitchen albuterol (VENTOLIN HFA) 108 (90 Base) MCG/ACT inhaler Inhale 2 puffs into the lungs every 6 (six) hours as  needed for wheezing or shortness of breath. 18 g 1  . atorvastatin (LIPITOR) 80 MG tablet TAKE 1 TABLET BY MOUTH DAILY AT 6 PM. PLEASE MAKE YEARLY APPT FOR MARCH FOR FUTURE REFILLS 30 tablet 0  . budesonide-formoterol (SYMBICORT) 160-4.5 MCG/ACT inhaler Inhale 2 puffs into the lungs 2 (two) times daily. 1 Inhaler 3  . clopidogrel (PLAVIX) 75 MG tablet Take 1 tablet (75 mg total) by mouth daily. Please keep upcoming appt in July with Dr. Tamala Julian before anymore refills. Thank you 30 tablet 2  . diphenhydrAMINE (BENADRYL) 25 MG tablet Take 50 mg by mouth every 6 (six) hours as needed for allergies.    . DULoxetine (CYMBALTA) 60 MG capsule Take 60 mg by mouth daily.    Marland Kitchen FLUoxetine (PROZAC) 10 MG capsule Take 30 mg by mouth daily.     . fluticasone (FLONASE) 50 MCG/ACT nasal spray Place 2 sprays into both nostrils daily. 16 g 2  . gabapentin (NEURONTIN) 300 MG capsule Take 300 mg by mouth 2 (two) times daily.    . hydrOXYzine (ATARAX/VISTARIL) 50 MG tablet TAKE 1 TABLET (50 MG) BY MOUTH EVERY NIGHT AS NEEDED    . Multiple Vitamin (MULTIVITAMIN WITH MINERALS) TABS tablet Take 1 tablet by mouth at bedtime.     Marland Kitchen oxyCODONE-acetaminophen (PERCOCET/ROXICET) 5-325 MG tablet Take 1 tablet by mouth 4 (four) times daily as needed.    . pantoprazole (PROTONIX) 40 MG tablet Take 1 tablet (40 mg total) by mouth daily. 90 tablet 0  . pentosan polysulfate (ELMIRON) 100 MG capsule Take 200 mg by mouth 2 (two) times daily as needed  (side pains).     . Probiotic Product (PROBIOTIC PO) Take 1 capsule by mouth at bedtime. [employer]'s brand    . traZODone (DESYREL) 150 MG tablet Take 150 mg by mouth at bedtime.    . nitroGLYCERIN (NITROSTAT) 0.4 MG SL tablet Place 1 tablet (0.4 mg total) under the tongue every 5 (five) minutes as needed for chest pain. 50 tablet 3   No current facility-administered medications on file prior to visit.    BP 116/72   Temp 97.7 F (36.5 C)   Wt 167 lb (75.8 kg)   LMP  (LMP Unknown)   BMI 29.58 kg/m       Objective:   Physical Exam Vitals and nursing note reviewed.  Constitutional:      Appearance: Normal appearance.  HENT:     Head: Normocephalic and atraumatic.     Right Ear: Tympanic membrane and external ear normal.     Left Ear: Tympanic membrane normal.     Nose: Nose normal.     Mouth/Throat:     Mouth: Mucous membranes are moist.     Pharynx: Oropharynx is clear.  Cardiovascular:     Rate and Rhythm: Normal rate and regular rhythm.     Pulses: Normal pulses.     Heart sounds: Normal heart sounds.  Pulmonary:     Effort: Pulmonary effort is normal.     Breath sounds: Normal breath sounds.  Abdominal:     General: Abdomen is flat. Bowel sounds are normal.     Palpations: Abdomen is soft.  Skin:    General: Skin is warm and dry.  Neurological:     General: No focal deficit present.     Mental Status: She is alert and oriented to person, place, and time.  Psychiatric:        Mood and Affect: Mood normal.  Behavior: Behavior normal.        Thought Content: Thought content normal.        Judgment: Judgment normal.       Assessment & Plan:  1. Preoperative clearance - EKG 12-Lead- NSR Sinus  Rhythm  -  Negative precordial T-waves. Rate 66 - CBC with Differential/Platelet; Future - DG Chest 2 View; Future - CMP; Future - Hemoglobin A1c; Future - Follow up with cardiology for clearance   [name], NP

## 2020-02-02 ENCOUNTER — Telehealth: Payer: Self-pay | Admitting: Interventional Cardiology

## 2020-02-02 ENCOUNTER — Other Ambulatory Visit: Payer: Self-pay

## 2020-02-02 ENCOUNTER — Ambulatory Visit (INDEPENDENT_AMBULATORY_CARE_PROVIDER_SITE_OTHER)
Admission: RE | Admit: 2020-02-02 | Discharge: 2020-02-02 | Disposition: A | Discharge status: Home / Self Care | Payer: PPO | Source: Ambulatory Visit | Attending: Adult Health | Admitting: Adult Health

## 2020-02-02 ENCOUNTER — Other Ambulatory Visit (INDEPENDENT_AMBULATORY_CARE_PROVIDER_SITE_OTHER): Payer: PPO

## 2020-02-02 DIAGNOSIS — Z01818 Encounter for other preprocedural examination: Secondary | ICD-10-CM | POA: Diagnosis not present

## 2020-02-02 DIAGNOSIS — J984 Other disorders of lung: Secondary | ICD-10-CM | POA: Diagnosis not present

## 2020-02-02 LAB — CBC WITH DIFFERENTIAL/PLATELET
Basophils Absolute: 0.1 10*3/uL (ref 0.0–0.1)
Basophils Relative: 0.7 % (ref 0.0–3.0)
Eosinophils Absolute: 0.1 10*3/uL (ref 0.0–0.7)
Eosinophils Relative: 1.8 % (ref 0.0–5.0)
HCT: 38.8 % (ref 36.0–46.0)
Hemoglobin: 13.2 g/dL (ref 12.0–15.0)
Lymphocytes Relative: 48.7 % — ABNORMAL HIGH (ref 12.0–46.0)
Lymphs Abs: 3.6 10*3/uL (ref 0.7–4.0)
MCHC: 34 g/dL (ref 30.0–36.0)
MCV: 92.4 fl (ref 78.0–100.0)
Monocytes Absolute: 0.7 10*3/uL (ref 0.1–1.0)
Monocytes Relative: 9 % (ref 3.0–12.0)
Neutro Abs: 3 10*3/uL (ref 1.4–7.7)
Neutrophils Relative %: 39.8 % — ABNORMAL LOW (ref 43.0–77.0)
Platelets: 210 10*3/uL (ref 150.0–400.0)
RBC: 4.19 Mil/uL (ref 3.87–5.11)
RDW: 13 % (ref 11.5–15.5)
WBC: 7.4 10*3/uL (ref 4.0–10.5)

## 2020-02-02 LAB — HEMOGLOBIN A1C: Hgb A1c MFr Bld: 5.6 % (ref 4.6–6.5)

## 2020-02-02 LAB — COMPREHENSIVE METABOLIC PANEL
ALT: 10 U/L (ref 0–35)
AST: 16 U/L (ref 0–37)
Albumin: 4.1 g/dL (ref 3.5–5.2)
Alkaline Phosphatase: 85 U/L (ref 39–117)
BUN: 7 mg/dL (ref 6–23)
CO2: 27 mEq/L (ref 19–32)
Calcium: 9.4 mg/dL (ref 8.4–10.5)
Chloride: 96 mEq/L (ref 96–112)
Creatinine, Ser: 0.91 mg/dL (ref 0.40–1.20)
GFR: 60.41 mL/min (ref >60.00)
Glucose, Bld: 105 mg/dL — ABNORMAL HIGH (ref 70–99)
Potassium: 3.4 mEq/L — ABNORMAL LOW (ref 3.5–5.1)
Sodium: 131 mEq/L — ABNORMAL LOW (ref 135–145)
Total Bilirubin: 0.4 mg/dL (ref 0.2–1.2)
Total Protein: 6.6 g/dL (ref 6.0–8.3)

## 2020-02-02 MED ORDER — ATORVASTATIN CALCIUM 80 MG PO TABS: ORAL_TABLET | ORAL | 0 refills | Status: DC

## 2020-02-02 NOTE — Telephone Encounter (Signed)
New Message    *STAT* If patient is at the pharmacy, call can be transferred to refill team.   1. Which medications need to be refilled? (please list name of each medication and dose if known) atorvastatin (LIPITOR) 80 MG tablet  2. Which pharmacy/location (including street and city if local pharmacy) is medication to be sent to? [id number] - [place], [place] [address]  3. Do they need a 30 day or 90 day supply? [place]

## 2020-02-02 NOTE — Telephone Encounter (Signed)
Pt's medication was sent to pt's pharmacy as requested. Confirmation received.  °

## 2020-02-06 NOTE — Progress Notes (Deleted)
CARDIOLOGY OFFICE NOTE  Date:  02/06/2020    Bianca [name] Date of Birth: [birth year] Medical Record #[id number]  PCP:  [name], NP  Cardiologist:  Tamala Julian  No chief complaint on file.   History of Present Illness: Bianca [name] is a 74 y.o. female who presents today for a pre op clearance visit. Seen for Dr. Tamala Julian.   She has a history of known CAD with prior inferior MI 1997, prior right coronary DES in 2007, non-ST elevation MI 2018, and ultimate coronary bypass grafting with LIMA to LAD, SVG to obtuse marginal, and SVG to PDA October 2018.Amiodarone therapy was used for postoperative atrial fibrillation.Significant chronic pain problem related to her back. She has had recurrent chest pain c/w angina.   Last seen in March of 2020 by Dr. Tamala Julian.   The patient {does/does not:200015} have symptoms concerning for COVID-19 infection (fever, chills, cough, or new shortness of breath).   Comes in today. Here alone. To have hip arthroscopy next month.    Past Medical History:  Diagnosis Date  . Arthritis   . Blood transfusion without reported diagnosis   . CAD (coronary artery disease)    a. 1997 MI/PCI RCA;  b. 2007 MI/PCI of 100% RCA with Taxus DES x 3 placed, EF 55%;  c. 2010 Cath: stable anatomy;  d. 05/2012 NSTEMI/Cath/PCI: LM nl, LAD 60-71m, D1 20ost, LCX 29m, RCA 40-43m ISR, 60/95d (Treated w/ 2.75x33 Xience Xpedition DES), PDA 73m (Treated w/ PTCA), EF 60%.  . Carpal tunnel syndrome on both sides   . Chronic lower back pain   . Depression   . GERD (gastroesophageal reflux disease)   . Hyperlipidemia   . Myocardial infarction [facility]) 1997, 2007, 2013  . Numbness of foot    Left foot - back surgery 2009    Past Surgical History:  Procedure Laterality Date  . ANKLE SURGERY  Years ago   Left; "had to take out a floater"  . CARPAL TUNNEL RELEASE Left 10/12016  . CARPAL TUNNEL RELEASE Right 02/21/2019   Emerge Lasandra Beech, MD  . [place]   . CORONARY ANGIOPLASTY WITH STENT PLACEMENT  1997; 2007, 2013   "2 + 2; + 1= total of 5  . CORONARY ARTERY BYPASS GRAFT N/A 05/26/2017   Procedure: CORONARY ARTERY BYPASS GRAFTING (CABG), ON PUMP, TIMES Three, using left internal mammary artery and right greater saphenous vein harvested endoscopically;  Surgeon: [name], MD;  Location: [place];  Service: Open Heart Surgery;  Laterality: N/A;  LIMA to LAD, SVG to OM 1, SVG to PDA  . LEFT HEART CATH AND CORONARY ANGIOGRAPHY N/A 05/26/2017   Procedure: LEFT HEART CATH AND CORONARY ANGIOGRAPHY;  Surgeon: Belva Crome, MD;  Location: [place] CV LAB;  Service: Cardiovascular;  Laterality: N/A;  . LEFT HEART CATHETERIZATION WITH CORONARY ANGIOGRAM N/A 05/18/2012   Procedure: LEFT HEART CATHETERIZATION WITH CORONARY ANGIOGRAM;  Surgeon: Wellington Hampshire, MD;  Location: [place] CATH LAB;  Service: Cardiovascular;  Laterality: N/A;  . [place]; 2009  . TEE WITHOUT CARDIOVERSION N/A 05/26/2017   Procedure: TRANSESOPHAGEAL ECHOCARDIOGRAM (TEE);  Surgeon: [name], MD;  Location: [place];  Service: Open Heart Surgery;  Laterality: N/A;  . TOTAL HIP ARTHROPLASTY Right 05/24/2014   Procedure: RIGHT TOTAL HIP ARTHROPLASTY ANTERIOR APPROACH;  Surgeon: Gearlean Alf, MD;  [facility];  Service: Orthopedics;  Laterality: Right;     Medications: No outpatient medications  have been marked as taking for the 02/07/20 encounter (Appointment) with Burtis Junes, NP.     Allergies: Allergies  Allergen Reactions  . Oxycodone Hcl Itching and Other (See Comments)    Confusion, hallucinations  . Penicillins Itching and Rash    Has patient had a PCN reaction causing immediate rash, facial/tongue/throat swelling, SOB or lightheadedness with hypotension: yes rash that took a while to go away across the abdomen Has patient had a PCN reaction causing severe rash involving mucus membranes or skin necrosis: no Has patient  had a PCN reaction that required hospitalization: no Has patient had a PCN reaction occurring within the last 10 years: No If all of the above answers are "NO", then may proceed with Cephalosporin use.   . Tramadol Itching and Other (See Comments)    hallucinations  . Aspirin Nausea And Vomiting and Other (See Comments)    Burning in stomach- tolerates enteric coated  . Macrobid [Nitrofurantoin Monohyd Macro] Nausea And Vomiting  . Sulfa Antibiotics Nausea Only    Social History: The patient  reports that she has been smoking cigarettes. She has a 12.50 pack-year smoking history. She has never used smokeless tobacco. She reports that she does not drink alcohol and does not use drugs.   Family History: The patient's ***family history includes Cancer in her paternal grandfather; Coronary artery disease in her mother; Dementia in her father; Diabetes in her mother; Hypertension in her mother; Sudden death in her brother.   Review of Systems: Please see the history of present illness.   All other systems are reviewed and negative.   Physical Exam: VS:  LMP  (LMP Unknown)  .  BMI There is no height or weight on file to calculate BMI.  Wt Readings from Last 3 Encounters:  01/26/20 167 lb (75.8 kg)  01/12/20 165 lb (74.8 kg)  01/05/19 169 lb (76.7 kg)    General: Pleasant. Well developed, well nourished and in no acute distress.   HEENT: Normal.  Neck: Supple, no JVD, carotid bruits, or masses noted.  Cardiac: ***Regular rate and rhythm. No murmurs, rubs, or gallops. No edema.  Respiratory:  Lungs are clear to auscultation bilaterally with normal work of breathing.  GI: Soft and nontender.  MS: No deformity or atrophy. Gait and ROM intact.  Skin: Warm and dry. Color is normal.  Neuro:  Strength and sensation are intact and no gross focal deficits noted.  Psych: Alert, appropriate and with normal affect.   LABORATORY DATA:  EKG:  EKG {ACTION; IS/IS [id number]} ordered today.   Personally reviewed by me. This demonstrates ***.  Lab Results  Component Value Date   WBC 7.4 02/02/2020   HGB 13.2 02/02/2020   HCT 38.8 02/02/2020   PLT 210.0 02/02/2020   GLUCOSE 105 (H) 02/02/2020   CHOL 233 (H) 10/31/2016   TRIG 316.0 (H) 10/31/2016   HDL 48.80 10/31/2016   LDLDIRECT 129.0 10/31/2016   LDLCALC 106 (H) 01/03/2014   ALT 10 02/02/2020   AST 16 02/02/2020   NA 131 (L) 02/02/2020   K 3.4 (L) 02/02/2020   CL 96 02/02/2020   CREATININE 0.91 02/02/2020   BUN 7 02/02/2020   CO2 27 02/02/2020   TSH 5.685 (H) 05/28/2017   INR 1.46 05/26/2017   HGBA1C 5.6 02/02/2020     BNP (last 3 results) No results for input(s): BNP in the last 8760 hours.  ProBNP (last 3 results) No results for input(s): PROBNP in the last 8760 hours.  Other Studies Reviewed Today:  Echo TEE Study Conclusions 05/2017  - Left ventricle: The cavity size was normal. Wall thickness was  normal. Systolic function was normal. The estimated ejection  fraction was in the range of 50% to 55%. Wall motion was normal;  there were no regional wall motion abnormalities.  - Mitral valve: Mildly dilated, noncalcified annulus. Normal  thickness leaflets . Leaflet separation was normal. Mobility was  not restricted. There was mild to moderate regurgitation directed  centrally.  - Staged echo: Limited Post- CPB exam: Good, vigorous LV function.  There are no apparent wall motion abnormalities. No change post  bypass in aortic valve function. No change post bypass in mitral  valve function. Moderate central Mitral regurgitation persists  post- CABG..   Impressions:   - LV function remains normal, similar to pre-bypass images. There  are no significant changes from pre-bypass images.    ASSESSMENT & PLAN:    1. Pre op clearance  2. Known CAD - remote MI - s/p CABG from 2018  3. Prior smoker  4. HTN  5. HLD   1. No evidence of myocardial infarction or  ischemia based on emergency room work-up and follow-up here in the office.  Secondary risk factor modification discussed. 2. No longer a smoker. 3. Lipids are unknown.  Liver and lipid panel needs to be performed.  This was recommended but she deferred preferring to have it done by PCP.  Moderate activity encouraged.  Clinical follow-up in 9 months.  New prescription for nitroglycerin is given.    Current medicines are reviewed with the patient today.  The patient does not have concerns regarding medicines other than what has been noted above.  The following changes have been made:  See above.  Labs/ tests ordered today include:   No orders of the defined types were placed in this encounter.    Disposition:   FU with *** in {gen number [id number]} {Days to years:10300}.   Patient is agreeable to this plan and will call if any problems develop in the interim.   SignedTruitt Merle, NP  02/06/2020 7:33 AM  [place] [facility] [address] [place], [place]  [address] Phone: [phone] Fax: [phone]

## 2020-02-07 ENCOUNTER — Ambulatory Visit: Payer: PPO | Admitting: Nurse Practitioner

## 2020-02-14 NOTE — Progress Notes (Signed)
Cardiology Office Note:    Date:  02/15/2020   ID:  [name], DOB 10/10/1945, MRN [id number]  PCP:  [name], NP  Cardiologist:  Sinclair Grooms, MD   Referring MD: [name], NP   Chief Complaint  Patient presents with  . Coronary Artery Disease    History of Present Illness:    Bianca [name] is a 74 y.o. female with a hx of coronary artery disease, prior inferior MI 1997, prior right coronary drug-eluting stents 2007, non-ST elevation MI 2018, and ultimate coronary bypass grafting with LIMA to LAD, SVG to obtuse marginal, and SVG to PDA October 2018.Amiodarone therapy was used for postoperative atrial fibrillation.  She has not had angina or dyspnea.  She is physically sedentary due to severe left hip pain and has upcoming hip surgery by Dr. Wynelle Link.  Recent laboratory data by Dr. Carlisle Cater demonstrated the liver function.  Unfortunately a lipid panel was not performed and the last one I can find was from 2018.  She continues to have sternal soreness related to bypass but otherwise unremarkable.  This is slowly gotten better over time but is persistent since the operation in 2018.  Past Medical History:  Diagnosis Date  . Arthritis   . Blood transfusion without reported diagnosis   . CAD (coronary artery disease)    a. 1997 MI/PCI RCA;  b. 2007 MI/PCI of 100% RCA with Taxus DES x 3 placed, EF 55%;  c. 2010 Cath: stable anatomy;  d. 05/2012 NSTEMI/Cath/PCI: LM nl, LAD 60-20m, D1 20ost, LCX 64m, RCA 40-39m ISR, 60/95d (Treated w/ 2.75x33 Xience Xpedition DES), PDA 110m (Treated w/ PTCA), EF 60%.  . Carpal tunnel syndrome on both sides   . Chronic lower back pain   . Depression   . GERD (gastroesophageal reflux disease)   . Hyperlipidemia   . Myocardial infarction [facility]) 1997, 2007, 2013  . Numbness of foot    Left foot - back surgery 2009    Past Surgical History:  Procedure Laterality Date  . ANKLE SURGERY  Years ago   Left; "had to take out a floater"    . CARPAL TUNNEL RELEASE Left 10/12016  . CARPAL TUNNEL RELEASE Right 02/21/2019   Emerge Lasandra Beech, MD  . [place]  . CORONARY ANGIOPLASTY WITH STENT PLACEMENT  1997; 2007, 2013   "2 + 2; + 1= total of 5  . CORONARY ARTERY BYPASS GRAFT N/A 05/26/2017   Procedure: CORONARY ARTERY BYPASS GRAFTING (CABG), ON PUMP, TIMES Three, using left internal mammary artery and right greater saphenous vein harvested endoscopically;  Surgeon: [name], MD;  Location: [place];  Service: Open Heart Surgery;  Laterality: N/A;  LIMA to LAD, SVG to OM 1, SVG to PDA  . LEFT HEART CATH AND CORONARY ANGIOGRAPHY N/A 05/26/2017   Procedure: LEFT HEART CATH AND CORONARY ANGIOGRAPHY;  Surgeon: Belva Crome, MD;  Location: [place] [facility];  Service: Cardiovascular;  Laterality: N/A;  . LEFT HEART CATHETERIZATION WITH CORONARY ANGIOGRAM N/A 05/18/2012   Procedure: LEFT HEART CATHETERIZATION WITH CORONARY ANGIOGRAM;  Surgeon: Wellington Hampshire, MD;  Location: [place] CATH LAB;  Service: Cardiovascular;  Laterality: N/A;  . [place]; 2009  . TEE WITHOUT CARDIOVERSION N/A 05/26/2017   Procedure: TRANSESOPHAGEAL ECHOCARDIOGRAM (TEE);  Surgeon: [name], MD;  Location: [place];  Service: Open Heart Surgery;  Laterality: N/A;  . TOTAL HIP ARTHROPLASTY Right 05/24/2014   Procedure: RIGHT TOTAL  HIP ARTHROPLASTY ANTERIOR APPROACH;  Surgeon: Gearlean Alf, MD;  Location: [facility];  Service: Orthopedics;  Laterality: Right;    Current Medications: Current Meds  Medication Sig  . acetaminophen (TYLENOL) 325 MG tablet Take 2 tablets (650 mg total) by mouth every 6 (six) hours as needed for fever, headache, mild pain or moderate pain.  Marland Kitchen albuterol (VENTOLIN HFA) 108 (90 Base) MCG/ACT inhaler Inhale 2 puffs into the lungs every 6 (six) hours as needed for wheezing or shortness of breath.  Marland Kitchen atorvastatin (LIPITOR) 80 MG tablet TAKE 1 TABLET BY MOUTH DAILY AT 6 PM. Please  keep upcoming appt in June before anymore refills. Thank you  . budesonide-formoterol (SYMBICORT) 160-4.5 MCG/ACT inhaler Inhale 2 puffs into the lungs 2 (two) times daily.  . clopidogrel (PLAVIX) 75 MG tablet Take 1 tablet (75 mg total) by mouth daily. Please keep upcoming appt in July with Dr. Tamala Julian before anymore refills. Thank you  . diphenhydrAMINE (BENADRYL) 25 MG tablet Take 50 mg by mouth at bedtime as needed for allergies.   Marland Kitchen FLUoxetine HCl 60 MG TABS Take 60 mg by mouth daily.   . fluticasone (FLONASE) 50 MCG/ACT nasal spray Place 2 sprays into both nostrils daily.  . hydrOXYzine (ATARAX/VISTARIL) 50 MG tablet TAKE 1 TABLET (50 MG) BY MOUTH EVERY NIGHT AS NEEDED  . nitroGLYCERIN (NITROSTAT) 0.4 MG SL tablet Place 1 tablet (0.4 mg total) under the tongue every 5 (five) minutes as needed for chest pain.  Marland Kitchen oxyCODONE-acetaminophen (PERCOCET/ROXICET) 5-325 MG tablet Take 1 tablet by mouth 4 (four) times daily as needed for moderate pain.   . pantoprazole (PROTONIX) 40 MG tablet Take 1 tablet (40 mg total) by mouth daily.  . pentosan polysulfate (ELMIRON) 100 MG capsule Take 200 mg by mouth 2 (two) times daily as needed (side pains).   . Probiotic Product (PROBIOTIC PO) Take 1 capsule by mouth at bedtime.   . traZODone (DESYREL) 150 MG tablet Take 150 mg by mouth at bedtime.     Allergies:   Oxycodone hcl, Penicillins, Tramadol, Aspirin, Macrobid [nitrofurantoin monohyd macro], and Sulfa antibiotics   Social History   Socioeconomic History  . Marital status: Married    Spouse name: Not on file  . Number of children: Not on file  . Years of education: Not on file  . Highest education level: Not on file  Occupational History  . Not on file  Tobacco Use  . Smoking status: Current Some Day Smoker    Packs/day: 0.25    Years: 50.00    Pack years: 12.50    Types: Cigarettes  . Smokeless tobacco: Never Used  Vaping Use  . Vaping Use: Never used  Substance and Sexual Activity  .  Alcohol use: No    Alcohol/week: 0.0 standard drinks  . Drug use: No  . Sexual activity: Not Currently    Birth control/protection: Post-menopausal  Other Topics Concern  . Not on file  Social History Narrative   Is a homemaker   Married for 62 years    Has a daughter and son    Pets: Two dogs and a Neurosurgeon   Likes to garden ( flower beds).       Social Determinants of Health   Financial Resource Strain:   . Difficulty of Paying Living Expenses:   Food Insecurity:   . Worried About Charity fundraiser in the Last Year:   . Arboriculturist in the Last Year:   Transportation Needs:   .  Lack of Transportation (Medical):   Marland Kitchen Lack of Transportation (Non-Medical):   Physical Activity:   . Days of Exercise per Week:   . Minutes of Exercise per Session:   Stress:   . Feeling of Stress :   Social Connections:   . Frequency of Communication with Friends and Family:   . Frequency of Social Gatherings with Friends and Family:   . Attends Religious Services:   . Active Member of Clubs or Organizations:   . Attends Archivist Meetings:   Marland Kitchen Marital Status:      Family History: The patient's family history includes Cancer in her paternal grandfather; Coronary artery disease in her mother; Dementia in her father; Diabetes in her mother; Hypertension in her mother; Sudden death in her brother. There is no history of Colon cancer.  ROS:   Please see the history of present illness.    Skin rash in the suprapubic area.  Occasional vertigo.  Left hip pain.  Upcoming hip replacement surgery.  All other systems reviewed and are negative.  EKGs/Labs/Other Studies Reviewed:    The following studies were reviewed today: No recent cardiac data  EKG:  EKG performed 01/26/2020 demonstrates prominent voltage, normal sinus rhythm, borderline PR interval.  Recent Labs: 02/02/2020: ALT 10; BUN 7; Creatinine, Ser 0.91; Hemoglobin 13.2; Platelets 210.0; Potassium 3.4; Sodium 131  Recent Lipid  Panel    Component Value Date/Time   CHOL 233 (H) 10/31/2016 0915   TRIG 316.0 (H) 10/31/2016 0915   HDL 48.80 10/31/2016 0915   CHOLHDL 5 10/31/2016 0915   VLDL 63.2 (H) 10/31/2016 0915   LDLCALC 106 (H) 01/03/2014 0939   LDLDIRECT 129.0 10/31/2016 0915    Physical Exam:    VS:  BP 126/70   Pulse 66   Ht 5\' 3"  (1.6 m)   Wt 166 lb 9.6 oz (75.6 kg)   LMP  (LMP Unknown)   SpO2 96%   BMI 29.51 kg/m     Wt Readings from Last 3 Encounters:  02/15/20 166 lb 9.6 oz (75.6 kg)  01/26/20 167 lb (75.8 kg)  01/12/20 165 lb (74.8 kg)     GEN: Moderate obesity. No acute distress HEENT: Normal NECK: No JVD. LYMPHATICS: No lymphadenopathy CARDIAC:  RRR without murmur, gallop, or edema. VASCULAR:  Normal Pulses. No bruits. RESPIRATORY:  Clear to auscultation without rales, wheezing or rhonchi  ABDOMEN: Suprapubic skin eruption that is erythematous with satellite lesions consistent with yeast infection.  May also be a small subcutaneous abscess.  No drainage is noted.  Soft, non-tender, non-distended, No pulsatile mass, MUSCULOSKELETAL: No deformity  SKIN: Warm and dry NEUROLOGIC:  Alert and oriented x 3 PSYCHIATRIC:  Normal affect   ASSESSMENT:    1. Coronary artery disease involving coronary bypass graft of native heart with angina pectoris ([place])   2. Encounter for preoperative assessment for noncoronary cardiac surgery   3. Cigarette smoker   4. Hyperlipidemia with target LDL less than 70   5. Postoperative atrial fibrillation (HCC)   6. Educated about COVID-19 virus infection   7. Yeast infection    PLAN:    In order of problems listed above:  1. Secondary prevention discussed.  Emphasized aerobic activity.  She emphasized that she is unable to do this because of left hip debility with upcoming surgery. 2. Cleared for upcoming left hip surgery by Dr. Wynelle Link.  No preoperative evaluation is needed from cardiac standpoint. 3. She has not resumed smoking 4. A lipid panel  will  be done today.  She is on high intensity statin therapy but no data for several years.  Liver panel is normal with blood work done this month. 5. No recurrence of atrial fibrillation. 6. She has been vaccinated. 7. She has an intertriginous yeast infection in the suprapubic region and there is possibly a small skin abscess.  I recommended that she speak with Dr. Carlisle Cater about this immediately.  Overall education and awareness concerning secondary risk prevention was discussed in detail: LDL less than 70, hemoglobin A1c less than 7, blood pressure target less than 130/80 mmHg, >150 minutes of moderate aerobic activity per week, avoidance of smoking, weight control (via diet and exercise), and continued surveillance/management of/for obstructive sleep apnea.    Medication Adjustments/Labs and Tests Ordered: Current medicines are reviewed at length with the patient today.  Concerns regarding medicines are outlined above.  Orders Placed This Encounter  Procedures  . Lipid panel   No orders of the defined types were placed in this encounter.   Patient Instructions  Medication Instructions:  Your physician recommends that you continue on your current medications as directed. Please refer to the Current Medication list given to you today.  *If you need a refill on your cardiac medications before your next appointment, please call your pharmacy*   Lab Work: Lipid panel today  If you have labs (blood work) drawn today and your tests are completely normal, you will receive your results only by: Marland Kitchen MyChart Message (if you have MyChart) OR . A paper copy in the mail If you have any lab test that is abnormal or we need to change your treatment, we will call you to review the results.   Testing/Procedures: None   Follow-Up: At [facility], you and your health needs are our priority.  As part of our continuing mission to provide you with exceptional heart care, we have created  designated Provider Care Teams.  These Care Teams include your primary Cardiologist (physician) and Advanced Practice Providers (APPs -  Physician Assistants and Nurse Practitioners) who all work together to provide you with the care you need, when you need it.  We recommend signing up for the patient portal called "MyChart".  Sign up information is provided on this After Visit Summary.  MyChart is used to connect with patients for Virtual Visits (Telemedicine).  Patients are able to view lab/test results, encounter notes, upcoming appointments, etc.  Non-urgent messages can be sent to your provider as well.   To learn more about what you can do with MyChart, go to [email or web address].    Your next appointment:   12 month(s)  The format for your next appointment:   In Person  Provider:   You may see Sinclair Grooms, MD or one of the following Advanced Practice Providers on your designated Care Team:    Truitt Merle, NP  Cecilie Kicks, NP  Kathyrn Drown, NP    Other Instructions      Signed, Sinclair Grooms, MD  02/15/2020 9:04 AM    [place]

## 2020-02-15 ENCOUNTER — Ambulatory Visit: Payer: PPO | Admitting: Interventional Cardiology

## 2020-02-15 ENCOUNTER — Encounter: Payer: Self-pay | Admitting: Interventional Cardiology

## 2020-02-15 ENCOUNTER — Other Ambulatory Visit: Payer: Self-pay

## 2020-02-15 VITALS — BP 126/70 | HR 66 | Ht 63.0 in | Wt 166.6 lb

## 2020-02-15 DIAGNOSIS — I25709 Atherosclerosis of coronary artery bypass graft(s), unspecified, with unspecified angina pectoris: Secondary | ICD-10-CM | POA: Diagnosis not present

## 2020-02-15 DIAGNOSIS — E785 Hyperlipidemia, unspecified: Secondary | ICD-10-CM | POA: Diagnosis not present

## 2020-02-15 DIAGNOSIS — I4891 Unspecified atrial fibrillation: Secondary | ICD-10-CM

## 2020-02-15 DIAGNOSIS — F17210 Nicotine dependence, cigarettes, uncomplicated: Secondary | ICD-10-CM

## 2020-02-15 DIAGNOSIS — Z7189 Other specified counseling: Secondary | ICD-10-CM | POA: Diagnosis not present

## 2020-02-15 DIAGNOSIS — Z01810 Encounter for preprocedural cardiovascular examination: Secondary | ICD-10-CM

## 2020-02-15 DIAGNOSIS — B379 Candidiasis, unspecified: Secondary | ICD-10-CM

## 2020-02-15 DIAGNOSIS — I9789 Other postprocedural complications and disorders of the circulatory system, not elsewhere classified: Secondary | ICD-10-CM

## 2020-02-15 LAB — LIPID PANEL
Chol/HDL Ratio: 3.4 ratio (ref 0.0–4.4)
Cholesterol, Total: 169 mg/dL (ref 100–199)
HDL: 49 mg/dL (ref >39)
LDL Chol Calc (NIH): 79 mg/dL (ref 0–99)
Triglycerides: 249 mg/dL — ABNORMAL HIGH (ref 0–149)
VLDL Cholesterol Cal: 41 mg/dL — ABNORMAL HIGH (ref 5–40)

## 2020-02-15 NOTE — Patient Instructions (Addendum)
Medication Instructions:  Your physician recommends that you continue on your current medications as directed. Please refer to the Current Medication list given to you today.  *If you need a refill on your cardiac medications before your next appointment, please call your pharmacy*   Lab Work: Lipid panel today  If you have labs (blood work) drawn today and your tests are completely normal, you will receive your results only by: Marland Kitchen MyChart Message (if you have MyChart) OR . A paper copy in the mail If you have any lab test that is abnormal or we need to change your treatment, we will call you to review the results.   Testing/Procedures: None   Follow-Up: At [facility], you and your health needs are our priority.  As part of our continuing mission to provide you with exceptional heart care, we have created designated Provider Care Teams.  These Care Teams include your primary Cardiologist (physician) and Advanced Practice Providers (APPs -  Physician Assistants and Nurse Practitioners) who all work together to provide you with the care you need, when you need it.  We recommend signing up for the patient portal called "MyChart".  Sign up information is provided on this After Visit Summary.  MyChart is used to connect with patients for Virtual Visits (Telemedicine).  Patients are able to view lab/test results, encounter notes, upcoming appointments, etc.  Non-urgent messages can be sent to your provider as well.   To learn more about what you can do with MyChart, go to [email or web address].    Your next appointment:   12 month(s)  The format for your next appointment:   In Person  Provider:   You may see Sinclair Grooms, MD or one of the following Advanced Practice Providers on your designated Care Team:    Truitt Merle, NP  Cecilie Kicks, NP  Kathyrn Drown, NP    Other Instructions

## 2020-02-16 NOTE — Patient Instructions (Addendum)
DUE TO COVID-19 ONLY ONE VISITOR IS ALLOWED TO COME WITH YOU AND STAY IN THE WAITING ROOM ONLY DURING PRE OP AND PROCEDURE DAY OF SURGERY. THE 1 VISITOR MAY VISIT WITH YOU AFTER SURGERY IN YOUR PRIVATE ROOM DURING VISITING HOURS ONLY!  YOU NEED TO HAVE A COVID 19 TEST ON 02-25-20 @ 12:30 PM_______, THIS TEST MUST BE DONE BEFORE SURGERY, COME  Bianca [name], Bianca [name] , [id number].  (Bianca [name]) ONCE YOUR COVID TEST IS COMPLETED, PLEASE BEGIN THE QUARANTINE INSTRUCTIONS AS OUTLINED IN YOUR HANDOUT.                Bianca [name]  02/16/2020   Your procedure is scheduled on: 02-29-20   Report to [facility] Main  Entrance    Report to admitting at 6:00 AM     Call this number if you have problems the morning of surgery [phone]    Remember: AFTER MIDNIGHT THE NIGHT PRIOR TO SURGERY. NOTHING BY MOUTH EXCEPT CLEAR LIQUIDS UNTIL 5:30 AM . PLEASE FINISH ENSURE DRINK PER SURGEON ORDER  WHICH NEEDS TO BE COMPLETED AT 5:30 AM .   CLEAR LIQUID DIET   Foods Allowed                                                                     Foods Excluded  Coffee and tea, regular and decaf                             liquids that you cannot  Plain Jell-O any favor except red or purple                                           see through such as: Fruit ices (not with fruit pulp)                                     milk, soups, orange juice  Iced Popsicles                                    All solid food Carbonated beverages, regular and diet                                    Cranberry, grape and apple juices Sports drinks like Gatorade Lightly seasoned clear broth or consume(fat free) Sugar, honey syrup  _____________________________________________________________________       Take these medicines the morning of surgery with A SIP OF WATER: Fluoxetine HCL, Pantoprazole (Protonix), and Percocet,prn. You may also use your inhaler and nasal spray if needed.   BRUSH  YOUR TEETH MORNING OF SURGERY AND RINSE YOUR MOUTH OUT, NO CHEWING GUM CANDY OR MINTS.               You may not have any metal on your body including hair pins and  piercings    Do not wear jewelry, make-up, lotions, powders or perfumes, deodorant              Do not wear nail polish on your fingernails.  Do not shave  48 hours prior to surgery.              Do not bring valuables to the hospital. Bianca [name].  Contacts, dentures or bridgework may not be worn into surgery.  You may bring a small overnight bag     Special Instructions: N/A              Please read over the following fact sheets you were given: _____________________________________________________________________             [facility] - Preparing for Surgery Before surgery, you can play an important role.  Because skin is not sterile, your skin needs to be as free of germs as possible.  You can reduce the number of germs on your skin by washing with CHG (chlorahexidine gluconate) soap before surgery.  CHG is an antiseptic cleaner which kills germs and bonds with the skin to continue killing germs even after washing. Please DO NOT use if you have an allergy to CHG or antibacterial soaps.  If your skin becomes reddened/irritated stop using the CHG and inform your nurse when you arrive at Short Stay. Do not shave (including legs and underarms) for at least 48 hours prior to the first CHG shower.  You may shave your face/neck. Please follow these instructions carefully:  1.  Shower with CHG Soap the night before surgery and the  morning of Surgery.  2.  If you choose to wash your hair, wash your hair first as usual with your  normal  shampoo.  3.  After you shampoo, rinse your hair and body thoroughly to remove the  shampoo.                           4.  Use CHG as you would any other liquid soap.  You can apply chg directly  to the skin and wash                        Gently with a scrungie or clean washcloth.  5.  Apply the CHG Soap to your body ONLY FROM THE NECK DOWN.   Do not use on face/ open                           Wound or open sores. Avoid contact with eyes, ears mouth and genitals (private parts).                       Wash face,  Genitals (private parts) with your normal soap.             6.  Wash thoroughly, paying special attention to the area where your surgery  will be performed.  7.  Thoroughly rinse your body with warm water from the neck down.  8.  DO NOT shower/wash with your normal soap after using and rinsing off  the CHG Soap.                9.  Pat yourself dry with a clean towel.  10.  Wear clean pajamas.            11.  Place clean sheets on your bed the night of your first shower and do not  sleep with pets. Day of Surgery : Do not apply any lotions/deodorants the morning of surgery.  Please wear clean clothes to the hospital/surgery center.  FAILURE TO FOLLOW THESE INSTRUCTIONS MAY RESULT IN THE CANCELLATION OF YOUR SURGERY PATIENT SIGNATURE_________________________________  NURSE SIGNATURE__________________________________  ________________________________________________________________________   Bianca [name]  An incentive spirometer is a tool that can help keep your lungs clear and active. This tool measures how well you are filling your lungs with each breath. Taking long deep breaths may help reverse or decrease the chance of developing breathing (pulmonary) problems (especially infection) following:  A long period of time when you are unable to move or be active. BEFORE THE PROCEDURE   If the spirometer includes an indicator to show your best effort, your nurse or respiratory therapist will set it to a desired goal.  If possible, sit up straight or lean slightly forward. Try not to slouch.  Hold the incentive spirometer in an upright position. INSTRUCTIONS FOR USE  1. Sit on the edge of  your bed if possible, or sit up as far as you can in bed or on a chair. 2. Hold the incentive spirometer in an upright position. 3. Breathe out normally. 4. Place the mouthpiece in your mouth and seal your lips tightly around it. 5. Breathe in slowly and as deeply as possible, raising the piston or the ball toward the top of the column. 6. Hold your breath for 3-5 seconds or for as long as possible. Allow the piston or ball to fall to the bottom of the column. 7. Remove the mouthpiece from your mouth and breathe out normally. 8. Rest for a few seconds and repeat Steps 1 through 7 at least 10 times every 1-2 hours when you are awake. Take your time and take a few normal breaths between deep breaths. 9. The spirometer may include an indicator to show your best effort. Use the indicator as a goal to work toward during each repetition. 10. After each set of 10 deep breaths, practice coughing to be sure your lungs are clear. If you have an incision (the cut made at the time of surgery), support your incision when coughing by placing a pillow or rolled up towels firmly against it. Once you are able to get out of bed, walk around indoors and cough well. You may stop using the incentive spirometer when instructed by your caregiver.  RISKS AND COMPLICATIONS  Take your time so you do not get dizzy or light-headed.  If you are in pain, you may need to take or ask for pain medication before doing incentive spirometry. It is harder to take a deep breath if you are having pain. AFTER USE  Rest and breathe slowly and easily.  It can be helpful to keep track of a log of your progress. Your caregiver can provide you with a simple table to help with this. If you are using the spirometer at home, follow these instructions: [place] IF:   You are having difficultly using the spirometer.  You have trouble using the spirometer as often as instructed.  Your pain medication is not giving enough relief  while using the spirometer.  You develop fever of 100.5 F (38.1 C) or higher. SEEK IMMEDIATE MEDICAL CARE IF:   You cough up bloody  sputum that had not been present before.  You develop fever of 102 F (38.9 C) or greater.  You develop worsening pain at or near the incision site. MAKE SURE YOU:   Understand these instructions.  Will watch your condition.  Will get help right away if you are not doing well or get worse. Document Released: 12/15/2006 Document Revised: 10/27/2011 Document Reviewed: 02/15/2007 [employer] Patient Information 2014 [employer], [place].   ________________________________________________________________________  WHAT IS A BLOOD TRANSFUSION? Blood Transfusion Information  A transfusion is the replacement of blood or some of its parts. Blood is made up of multiple cells which provide different functions.  Red blood cells carry oxygen and are used for blood loss replacement.  White blood cells fight against infection.  Platelets control bleeding.  Plasma helps clot blood.  Other blood products are available for specialized needs, such as hemophilia or other clotting disorders. BEFORE THE TRANSFUSION  Who gives blood for transfusions?   Healthy volunteers who are fully evaluated to make sure their blood is safe. This is blood bank blood. Transfusion therapy is the safest it has ever been in the practice of medicine. Before blood is taken from a donor, a complete history is taken to make sure that person has no history of diseases nor engages in risky social behavior (examples are intravenous drug use or sexual activity with multiple partners). The donor's travel history is screened to minimize risk of transmitting infections, such as malaria. The donated blood is tested for signs of infectious diseases, such as HIV and hepatitis. The blood is then tested to be sure it is compatible with you in order to minimize the chance of a transfusion reaction. If you or  a relative donates blood, this is often done in anticipation of surgery and is not appropriate for emergency situations. It takes many days to process the donated blood. RISKS AND COMPLICATIONS Although transfusion therapy is very safe and saves many lives, the main dangers of transfusion include:   Getting an infectious disease.  Developing a transfusion reaction. This is an allergic reaction to something in the blood you were given. Every precaution is taken to prevent this. The decision to have a blood transfusion has been considered carefully by your caregiver before blood is given. Blood is not given unless the benefits outweigh the risks. AFTER THE TRANSFUSION  Right after receiving a blood transfusion, you will usually feel much better and more energetic. This is especially true if your red blood cells have gotten low (anemic). The transfusion raises the level of the red blood cells which carry oxygen, and this usually causes an energy increase.  The nurse administering the transfusion will monitor you carefully for complications. HOME CARE INSTRUCTIONS  No special instructions are needed after a transfusion. You may find your energy is better. Speak with your caregiver about any limitations on activity for underlying diseases you may have. SEEK MEDICAL CARE IF:   Your condition is not improving after your transfusion.  You develop redness or irritation at the intravenous (IV) site. SEEK IMMEDIATE MEDICAL CARE IF:  Any of the following symptoms occur over the next 12 hours:  Shaking chills.  You have a temperature by mouth above 102 F (38.9 C), not controlled by medicine.  Chest, back, or muscle pain.  People around you feel you are not acting correctly or are confused.  Shortness of breath or difficulty breathing.  Dizziness and fainting.  You get a rash or develop hives.  You have a decrease  in urine output.  Your urine turns a dark color or changes to pink, red, or  brown. Any of the following symptoms occur over the next 10 days:  You have a temperature by mouth above 102 F (38.9 C), not controlled by medicine.  Shortness of breath.  Weakness after normal activity.  The white part of the eye turns yellow (jaundice).  You have a decrease in the amount of urine or are urinating less often.  Your urine turns a dark color or changes to pink, red, or brown. Document Released: 08/01/2000 Document Revised: 10/27/2011 Document Reviewed: 03/20/2008 [facility] Patient Information 2014 [place], [place].  _______________________________________________________________________

## 2020-02-16 NOTE — Progress Notes (Addendum)
COVID Vaccine Completed: Date COVID Vaccine completed: COVID vaccine manufacturer: [place]   PCP - [name], NP Cardiologist - Daneen Schick, MD w/ cardiac clearance in  02-15-20 office visit. Also on chart dated 02-02-20   Chest x-ray - 02-02-20 EKG - 01-26-20 Stress Test -  ECHO -  Cardiac Cath -   Sleep Study -  CPAP -   Fasting Blood Sugar -  Checks Blood Sugar _____ times a day  Blood Thinner Instructions: Plavix (Pt to contact Cardiologist or Surgeon for instructions) Aspirin Instructions: Last Dose:   No SOB w/ADL's   Anesthesia review:   Patient denies shortness of breath, fever, cough and chest pain at PAT appointment   Patient verbalized understanding of instructions that were given to them at the PAT appointment. Patient was also instructed that they will need to review over the PAT instructions again at home before surgery.

## 2020-02-21 ENCOUNTER — Encounter (HOSPITAL_COMMUNITY): Payer: Self-pay

## 2020-02-21 ENCOUNTER — Encounter (HOSPITAL_COMMUNITY)
Admission: RE | Admit: 2020-02-21 | Discharge: 2020-02-21 | Disposition: A | Discharge status: Home / Self Care | Payer: PPO | Source: Ambulatory Visit | Attending: Orthopedic Surgery | Admitting: Orthopedic Surgery

## 2020-02-21 ENCOUNTER — Other Ambulatory Visit: Payer: Self-pay

## 2020-02-21 DIAGNOSIS — Z01812 Encounter for preprocedural laboratory examination: Secondary | ICD-10-CM | POA: Insufficient documentation

## 2020-02-21 LAB — COMPREHENSIVE METABOLIC PANEL
ALT: 16 U/L (ref 0–44)
AST: 24 U/L (ref 15–41)
Albumin: 4.4 g/dL (ref 3.5–5.0)
Alkaline Phosphatase: 89 U/L (ref 38–126)
Anion gap: 8 (ref 5–15)
BUN: 6 mg/dL — ABNORMAL LOW (ref 8–23)
CO2: 26 mmol/L (ref 22–32)
Calcium: 9.6 mg/dL (ref 8.9–10.3)
Chloride: 98 mmol/L (ref 98–111)
Creatinine, Ser: 0.8 mg/dL (ref 0.44–1.00)
GFR calc Af Amer: >60 mL/min (ref >60)
GFR calc non Af Amer: >60 mL/min (ref >60)
Glucose, Bld: 106 mg/dL — ABNORMAL HIGH (ref 70–99)
Potassium: 4.3 mmol/L (ref 3.5–5.1)
Sodium: 132 mmol/L — ABNORMAL LOW (ref 135–145)
Total Bilirubin: 1 mg/dL (ref 0.3–1.2)
Total Protein: 7.1 g/dL (ref 6.5–8.1)

## 2020-02-21 LAB — CBC
HCT: 41.2 % (ref 36.0–46.0)
Hemoglobin: 13.9 g/dL (ref 12.0–15.0)
MCH: 31.2 pg (ref 26.0–34.0)
MCHC: 33.7 g/dL (ref 30.0–36.0)
MCV: 92.4 fL (ref 80.0–100.0)
Platelets: 215 10*3/uL (ref 150–400)
RBC: 4.46 MIL/uL (ref 3.87–5.11)
RDW: 12.3 % (ref 11.5–15.5)
WBC: 6.7 10*3/uL (ref 4.0–10.5)
nRBC: 0 % (ref 0.0–0.2)

## 2020-02-21 LAB — PROTIME-INR
INR: 1 (ref 0.8–1.2)
Prothrombin Time: 12.5 seconds (ref 11.4–15.2)

## 2020-02-21 LAB — SURGICAL PCR SCREEN
MRSA, PCR: NEGATIVE
Staphylococcus aureus: NEGATIVE

## 2020-02-21 LAB — APTT: aPTT: 29 seconds (ref 24–36)

## 2020-02-22 NOTE — H&P (Signed)
TOTAL HIP ADMISSION H&P  Patient is admitted for left total hip arthroplasty.  Subjective:  Chief Complaint: Left hip pain  HPI: Bianca [name], 74 y.o. female, has a history of pain and functional disability in the left hip due to arthritis and patient has failed non-surgical conservative treatments for greater than 12 weeks to include NSAID's and/or analgesics and activity modification. Onset of symptoms was gradual, starting several years ago with gradually worsening course since that time. The patient noted no past surgery on the left hip. Patient currently rates pain in the left hip at 8 out of 10 with activity. Patient has worsening of pain with activity and weight bearing, pain that interfers with activities of daily living and crepitus. Patient has evidence of severe bone-on-bone arthritis with large osteophyte formation by imaging studies. This condition presents safety issues increasing the risk of falls. There is no current active infection.  Patient Active Problem List   Diagnosis Date Noted  . Coronary artery disease involving coronary bypass graft of native heart with angina pectoris ([place]) 08/28/2017  . Postoperative atrial fibrillation ([place]) 08/28/2017  . RAD (reactive airway disease) 04/01/2016  . B12 deficiency 07/11/2015  . Anemia associated with acute blood loss 05/28/2014  . Cigarette smoker 05/28/2014  . OA (osteoarthritis) of hip 05/24/2014  . Allergic rhinitis due to pollen 04/01/2012  . Back pain 11/22/2010  . NEOPLASM OF UNCERTAIN BEHAVIOR OF SKIN 06/05/2010  . Swallowing difficulty 06/04/2010  . GERD 07/30/2009  . Hyperlipidemia with target LDL less than 70 03/02/2007  . Depression 03/02/2007    Past Medical History:  Diagnosis Date  . Arthritis   . Blood transfusion without reported diagnosis   . CAD (coronary artery disease)    a. 1997 MI/PCI RCA;  b. 2007 MI/PCI of 100% RCA with Taxus DES x 3 placed, EF 55%;  c. 2010 Cath: stable anatomy;  d. 05/2012  NSTEMI/Cath/PCI: LM nl, LAD 60-74m, D1 20ost, LCX 76m, RCA 40-12m ISR, 60/95d (Treated w/ 2.75x33 Xience Xpedition DES), PDA 73m (Treated w/ PTCA), EF 60%.  . Carpal tunnel syndrome on both sides   . Chronic lower back pain   . Depression   . GERD (gastroesophageal reflux disease)   . Hyperlipidemia   . Myocardial infarction [facility]) 1997, 2007, 2013  . Numbness of foot    Left foot - back surgery 2009    Past Surgical History:  Procedure Laterality Date  . ANKLE SURGERY  Years ago   Left; "had to take out a floater"  . CARPAL TUNNEL RELEASE Left 10/12016  . CARPAL TUNNEL RELEASE Right 02/21/2019   Emerge Lasandra Beech, MD  . [place]  . CORONARY ANGIOPLASTY WITH STENT PLACEMENT  1997; 2007, 2013   "2 + 2; + 1= total of 5  . CORONARY ARTERY BYPASS GRAFT N/A 05/26/2017   Procedure: CORONARY ARTERY BYPASS GRAFTING (CABG), ON PUMP, TIMES Three, using left internal mammary artery and right greater saphenous vein harvested endoscopically;  Surgeon: [name], MD;  Location: [place];  Service: Open Heart Surgery;  Laterality: N/A;  LIMA to LAD, SVG to OM 1, SVG to PDA  . LEFT HEART CATH AND CORONARY ANGIOGRAPHY N/A 05/26/2017   Procedure: LEFT HEART CATH AND CORONARY ANGIOGRAPHY;  Surgeon: Belva Crome, MD;  Location: [place] CV LAB;  Service: Cardiovascular;  Laterality: N/A;  . LEFT HEART CATHETERIZATION WITH CORONARY ANGIOGRAM N/A 05/18/2012   Procedure: LEFT HEART CATHETERIZATION WITH CORONARY ANGIOGRAM;  Surgeon: Wellington Hampshire,  MD;  Location: [place] CATH LAB;  Service: Cardiovascular;  Laterality: N/A;  . [place]; 2009  . TEE WITHOUT CARDIOVERSION N/A 05/26/2017   Procedure: TRANSESOPHAGEAL ECHOCARDIOGRAM (TEE);  Surgeon: [name], MD;  Location: [place];  Service: Open Heart Surgery;  Laterality: N/A;  . TOTAL HIP ARTHROPLASTY Right 05/24/2014   Procedure: RIGHT TOTAL HIP ARTHROPLASTY ANTERIOR APPROACH;  Surgeon: Gearlean Alf, MD;  [facility];  Service: Orthopedics;  Laterality: Right;    Prior to Admission medications   Medication Sig Start Date End Date Taking? Authorizing Provider  albuterol (VENTOLIN HFA) 108 (90 Base) MCG/ACT inhaler Inhale 2 puffs into the lungs every 6 (six) hours as needed for wheezing or shortness of breath. 01/23/20  Yes Martinique, Betty G, MD  atorvastatin (LIPITOR) 80 MG tablet TAKE 1 TABLET BY MOUTH DAILY AT 6 PM. Please keep upcoming appt in June before anymore refills. Thank you 02/02/20  Yes Belva Crome, MD  clopidogrel (PLAVIX) 75 MG tablet Take 1 tablet (75 mg total) by mouth daily. Please keep upcoming appt in July with Dr. Tamala Julian before anymore refills. Thank you 01/03/20  Yes Belva Crome, MD  diphenhydrAMINE (BENADRYL) 25 MG tablet Take 50 mg by mouth at bedtime as needed for allergies.    Yes [provider]  FLUoxetine HCl 60 MG TABS Take 60 mg by mouth daily.  01/10/20  Yes [provider]  fluticasone (FLONASE) 50 MCG/ACT nasal spray Place 2 sprays into both nostrils daily. 01/23/20  Yes Martinique, Betty G, MD  nitroGLYCERIN (NITROSTAT) 0.4 MG SL tablet Place 1 tablet (0.4 mg total) under the tongue every 5 (five) minutes as needed for chest pain. 11/01/18 02/15/20 Yes Belva Crome, MD  oxyCODONE-acetaminophen (PERCOCET/ROXICET) 5-325 MG tablet Take 1 tablet by mouth 4 (four) times daily as needed for moderate pain.  12/29/19  Yes [provider]  Probiotic Product (PROBIOTIC PO) Take 1 capsule by mouth at bedtime.    Yes [provider]  traZODone (DESYREL) 150 MG tablet Take 150 mg by mouth at bedtime. 01/03/20  Yes [provider]  acetaminophen (TYLENOL) 325 MG tablet Take 2 tablets (650 mg total) by mouth every 6 (six) hours as needed for fever, headache, mild pain or moderate pain. 06/04/17   Nani Skillern, PA-C  budesonide-formoterol (SYMBICORT) 160-4.5 MCG/ACT inhaler Inhale 2 puffs into the lungs 2 (two) times  daily. 01/23/20   [place]ique, Betty G, MD  hydrOXYzine (ATARAX/VISTARIL) 50 MG tablet TAKE 1 TABLET (50 MG) BY MOUTH EVERY NIGHT AS NEEDED 11/21/18   [provider]  pantoprazole (PROTONIX) 40 MG tablet Take 1 tablet (40 mg total) by mouth daily. 12/29/19   Nafziger, Tommi Rumps, NP  pentosan polysulfate (ELMIRON) 100 MG capsule Take 200 mg by mouth 2 (two) times daily as needed (side pains).  04/09/17   [provider]    Allergies  Allergen Reactions  . Oxycodone Hcl Itching and Other (See Comments)    Confusion, hallucinations  . Penicillins Itching and Rash    Has patient had a PCN reaction causing immediate rash, facial/tongue/throat swelling, SOB or lightheadedness with hypotension: yes rash that took a while to go away across the abdomen Has patient had a PCN reaction causing severe rash involving mucus membranes or skin necrosis: no Has patient had a PCN reaction that required hospitalization: no Has patient had a PCN reaction occurring within the last 10 years: No If all of the above answers  are "NO", then may proceed with Cephalosporin use.   . Tramadol Itching and Other (See Comments)    hallucinations  . Aspirin Nausea And Vomiting and Other (See Comments)    Burning in stomach- tolerates enteric coated  . Macrobid [Nitrofurantoin Monohyd Macro] Nausea And Vomiting  . Sulfa Antibiotics Nausea Only    Social History   Socioeconomic History  . Marital status: Married    Spouse name: Not on file  . Number of children: Not on file  . Years of education: Not on file  . Highest education level: Not on file  Occupational History  . Not on file  Tobacco Use  . Smoking status: Current Some Day Smoker    Packs/day: 0.25    Years: 50.00    Pack years: 12.50    Types: Cigarettes  . Smokeless tobacco: Never Used  . Tobacco comment: 5 cigarettes per day  Vaping Use  . Vaping Use: Never used  Substance and Sexual Activity  . Alcohol use: No    Alcohol/week: 0.0 standard  drinks  . Drug use: No  . Sexual activity: Not Currently    Birth control/protection: Post-menopausal  Other Topics Concern  . Not on file  Social History Narrative   Is a homemaker   Married for 51 years    Has a daughter and son    Pets: Two dogs and a Neurosurgeon   Likes to garden ( flower beds).       Social Determinants of Health   Financial Resource Strain:   . Difficulty of Paying Living Expenses:   Food Insecurity:   . Worried About Charity fundraiser in the Last Year:   . Arboriculturist in the Last Year:   Transportation Needs:   . Film/video editor (Medical):   Marland Kitchen Lack of Transportation (Non-Medical):   Physical Activity:   . Days of Exercise per Week:   . Minutes of Exercise per Session:   Stress:   . Feeling of Stress :   Social Connections:   . Frequency of Communication with Friends and Family:   . Frequency of Social Gatherings with Friends and Family:   . Attends Religious Services:   . Active Member of Clubs or Organizations:   . Attends Archivist Meetings:   Marland Kitchen Marital Status:   Intimate Partner Violence:   . Fear of Current or Ex-Partner:   . Emotionally Abused:   Marland Kitchen Physically Abused:   . Sexually Abused:       Tobacco Use: High Risk  . Smoking Tobacco Use: Current Some Day Smoker  . Smokeless Tobacco Use: Never Used   Social History   Substance and Sexual Activity  Alcohol Use No  . Alcohol/week: 0.0 standard drinks    Family History  Problem Relation Age of Onset  . Cancer Paternal Grandfather        Esophageal  . Coronary artery disease Mother   . Diabetes Mother   . Hypertension Mother   . Dementia Father   . Sudden death Brother        Suicide  . Colon cancer Neg Hx     Review of Systems  Constitutional: Negative for chills and fever.  HENT: Negative for congestion, sore throat and tinnitus.   Eyes: Negative for double vision, photophobia and pain.  Respiratory: Negative for cough, shortness of breath and  wheezing.   Cardiovascular: Negative for chest pain, palpitations and orthopnea.  Gastrointestinal: Negative for heartburn, nausea and  vomiting.  Genitourinary: Negative for dysuria, frequency and urgency.  Musculoskeletal: Positive for joint pain.  Neurological: Negative for dizziness, weakness and headaches.     Objective:  Physical Exam: Well nourished and well developed. General: Alert and oriented x3, cooperative and pleasant, no acute distress. Head: normocephalic, atraumatic, neck supple. Eyes: EOMI. Respiratory: breath sounds clear in all fields, no wheezing, rales, or rhonchi. Cardiovascular: Regular rate and rhythm, no murmurs, gallops or rubs.  Musculoskeletal:  Left Hip Exam:  Range of motion: Flexion to 100 degrees, No Internal Rotation, External Rotation to 20 degrees, and abduction to 30 degrees with discomfort.  There is no tenderness over the greater trochanteric bursa.   Calves soft and nontender. Motor function intact in LE. Strength 5/5 LE bilaterally. Neuro: Distal pulses 2+. Sensation to light touch intact in LE.  Imaging Review Plain radiographs demonstrate severe degenerative joint disease of the left hip. The bone quality appears to be adequate for age and reported activity level.  Assessment/Plan:  End stage arthritis, left hip  The patient history, physical examination, clinical judgement of the provider and imaging studies are consistent with end stage degenerative joint disease of the left hip and total hip arthroplasty is deemed medically necessary. The treatment options including medical management, injection therapy, arthroscopy and arthroplasty were discussed at length. The risks and benefits of total hip arthroplasty were presented and reviewed. The risks due to aseptic loosening, infection, stiffness, dislocation/subluxation, thromboembolic complications and other imponderables were discussed. The patient acknowledged the explanation, agreed to  proceed with the plan and consent was signed. Patient is being admitted for inpatient treatment for surgery, pain control, PT, OT, prophylactic antibiotics, VTE prophylaxis, progressive ambulation and ADLs and discharge planning.The patient is planning to be discharged home.   Patient's anticipated LOS is less than 2 midnights, meeting these requirements: - Lives within 1 hour of care - Has a competent adult at home to recover with post-op recover - NO history of  - Chronic pain requiring opiods  - Diabetes  - Heart failure  - Heart attack  - Stroke  - DVT/VTE  - Cardiac arrhythmia  - Respiratory Failure/COPD  - Renal failure  - Anemia  - Advanced Liver disease  Therapy Plans: HEP Disposition: Home with husband Planned DVT Prophylaxis: Plavix 75 mg & ASA 81 mg QD x 6 weeks DME Needed: None PCP: Hilton Cork, NP (clearance received) Cardiologist: Daneen Schick, MD (clearance provided) TXA: IV Allergies: ASA (gastric irritation), PCN (rash), tramadol, sulfa Anesthesia Concerns: None BMI: 29.5 Last HgbA1c: Not diabetic.  Other: - PMH significant for MI - Chronic pain management through Dr. Nelva Bush - Percocet 5-325 mg TID  - Will need to use 81 mg ASA for DVT prophylaxis, history of gastric irritation with NSAIDs  - Patient was instructed on what medications to stop prior to surgery. - Follow-up visit in 2 weeks with Dr. Wynelle Link - Begin physical therapy following surgery - Pre-operative lab work as pre-surgical testing - Prescriptions will be provided in hospital at time of discharge  Theresa Duty, PA-C Orthopedic Surgery [facility] [place]

## 2020-02-24 ENCOUNTER — Other Ambulatory Visit: Payer: Self-pay | Admitting: Interventional Cardiology

## 2020-02-24 NOTE — Progress Notes (Signed)
Anesthesia Chart Review  Case: [id number] Date/Time: 02/29/20 0815   Procedure: TOTAL HIP ARTHROPLASTY ANTERIOR APPROACH (Left Hip) - 183min   Anesthesia type: Choice   Pre-op diagnosis: left hip osteoarthritis   [facility] RO[facility] 10 / [facility]   Surgeons: Gaynelle Arabian, MD      DISCUSSION:74 y.o. current some day smoker (12.5 pack years) with h/o CAD (inferior MI 1997, DES 2007, NSTEMI 2018, CABG 2018), HLD, GERD, left hip OA scheduled for above procedure 02/29/2020 with Dr. Gaynelle Arabian.   Pt experienced post operative atrial fibrillation s/p CABG 05/2017 without recurrence.  Last seen by cardiology 02/15/2020.  Per OV note, "Cleared for upcoming left hip surgery by Dr. Wynelle Link.  No preoperative evaluation is needed from cardiac standpoint."  Dr. Tamala Julian remarks on intertriginous yeast infection to the suprapubic region and possibly a small skin abscess.  Advised pt to follow up with PCP asap.  Pt reports rash has resolved.  Denies any redness, swelling, warmth, tenderness.    Pt reports she has been advised to hold Plavix 5 days prior to procedure.   Anticipate pt can proceed with planned procedure barring acute status change.   VS: BP (!) 147/77   Pulse 66   Temp 36.6 C (Oral)   Resp 16   Ht 5\' 3"  (1.6 m)   Wt 73.9 kg   LMP  (LMP Unknown)   SpO2 96%   BMI 28.87 kg/m   PROVIDERS: [name], NP is PCP   Daneen Schick, MD is Cardiologist  LABS: Labs reviewed: Acceptable for surgery. (all labs ordered are listed, but only abnormal results are displayed)  Labs Reviewed  COMPREHENSIVE METABOLIC PANEL - Abnormal; Notable for the following components:      Result Value   Sodium 132 (*)    Glucose, Bld 106 (*)    BUN 6 (*)    All other components within normal limits  SURGICAL PCR SCREEN  APTT  CBC  PROTIME-INR  TYPE AND SCREEN     IMAGES:   EKG: 01/26/2020 Rate 66 bpm  Sinus rhythm   CV: Cardiac Cath 05/26/2017  Severe three-vessel coronary disease with acute  coronary syndrome/unstable angina presentation. No acute EKG changes are noted despite ongoing pain.  Total occlusion within the extensively stented right coronary. Distal vessel fills by collaterals from left to right.  Widely patent left main.  95% stenosis proximal to mid LAD distal to the first diagonal.  80% proximal to mid circumflex and 90% proximal first obtuse marginal.  Inferobasal akinesis. EF 35%. Normal LVEDP  RECOMMENDATIONS:   Acute coronary syndrome with ongoing chest discomfort in the setting of recent total occlusion of the right coronary which is now collateralized. Severe circumflex and LAD disease. Have spoken with Dr. Cyndia Bent who will try to arrange surgery for later today.  IV nitroglycerin,\ and beta blocker therapy.  Hold Plavix.  Echo 05/26/2017 Study Conclusions   - Left ventricle: The cavity size was normal. Wall thickness was  normal. Systolic function was normal. The estimated ejection  fraction was in the range of 50% to 55%. Wall motion was normal;  there were no regional wall motion abnormalities.  - Mitral valve: Mildly dilated, noncalcified annulus. Normal  thickness leaflets . Leaflet separation was normal. Mobility was  not restricted. There was mild to moderate regurgitation directed  centrally.  - Staged echo: Limited Post- CPB exam: Good, vigorous LV function.  There are no apparent wall motion abnormalities. No change post  bypass in aortic valve  function. No change post bypass in mitral  valve function. Moderate central Mitral regurgitation persists  post- CABG..   Impressions:   - LV function remains normal, similar to pre-bypass images. There  are no significant changes from pre-bypass images.   Myocardial Perfusion 12/31/2016  Nuclear stress EF: 62%.  There was no ST segment deviation noted during stress.  The study is normal.  The left ventricular ejection fraction is normal (55-65%).   1. EF 62%,  normal wall motion.  2. No evidence for ischemia or infarction.   Normal study.   Past Medical History:  Diagnosis Date  . Arthritis   . Blood transfusion without reported diagnosis   . CAD (coronary artery disease)    a. 1997 MI/PCI RCA;  b. 2007 MI/PCI of 100% RCA with Taxus DES x 3 placed, EF 55%;  c. 2010 Cath: stable anatomy;  d. 05/2012 NSTEMI/Cath/PCI: LM nl, LAD 60-80m, D1 20ost, LCX 38m, RCA 40-54m ISR, 60/95d (Treated w/ 2.75x33 Xience Xpedition DES), PDA 59m (Treated w/ PTCA), EF 60%.  . Carpal tunnel syndrome on both sides   . Chronic lower back pain   . Depression   . GERD (gastroesophageal reflux disease)   . Hyperlipidemia   . Myocardial infarction [facility]) 1997, 2007, 2013  . Numbness of foot    Left foot - back surgery 2009    Past Surgical History:  Procedure Laterality Date  . ANKLE SURGERY  Years ago   Left; "had to take out a floater"  . CARPAL TUNNEL RELEASE Left 10/12016  . CARPAL TUNNEL RELEASE Right 02/21/2019   Emerge Lasandra Beech, MD  . [place]  . CORONARY ANGIOPLASTY WITH STENT PLACEMENT  1997; 2007, 2013   "2 + 2; + 1= total of 5  . CORONARY ARTERY BYPASS GRAFT N/A 05/26/2017   Procedure: CORONARY ARTERY BYPASS GRAFTING (CABG), ON PUMP, TIMES Three, using left internal mammary artery and right greater saphenous vein harvested endoscopically;  Surgeon: [name], MD;  Location: [place];  Service: Open Heart Surgery;  Laterality: N/A;  LIMA to LAD, SVG to OM 1, SVG to PDA  . LEFT HEART CATH AND CORONARY ANGIOGRAPHY N/A 05/26/2017   Procedure: LEFT HEART CATH AND CORONARY ANGIOGRAPHY;  Surgeon: Belva Crome, MD;  Location: [place] CV LAB;  Service: Cardiovascular;  Laterality: N/A;  . LEFT HEART CATHETERIZATION WITH CORONARY ANGIOGRAM N/A 05/18/2012   Procedure: LEFT HEART CATHETERIZATION WITH CORONARY ANGIOGRAM;  Surgeon: Wellington Hampshire, MD;  Location: [place] CATH LAB;  Service: Cardiovascular;  Laterality: N/A;  .  [place]; 2009  . TEE WITHOUT CARDIOVERSION N/A 05/26/2017   Procedure: TRANSESOPHAGEAL ECHOCARDIOGRAM (TEE);  Surgeon: [name], MD;  Location: [place];  Service: Open Heart Surgery;  Laterality: N/A;  . TOTAL HIP ARTHROPLASTY Right 05/24/2014   Procedure: RIGHT TOTAL HIP ARTHROPLASTY ANTERIOR APPROACH;  Surgeon: Gearlean Alf, MD;  [facility];  Service: Orthopedics;  Laterality: Right;    MEDICATIONS: . acetaminophen (TYLENOL) 325 MG tablet  . albuterol (VENTOLIN HFA) 108 (90 Base) MCG/ACT inhaler  . atorvastatin (LIPITOR) 80 MG tablet  . budesonide-formoterol (SYMBICORT) 160-4.5 MCG/ACT inhaler  . clopidogrel (PLAVIX) 75 MG tablet  . diphenhydrAMINE (BENADRYL) 25 MG tablet  . FLUoxetine HCl 60 MG TABS  . fluticasone (FLONASE) 50 MCG/ACT nasal spray  . hydrOXYzine (ATARAX/VISTARIL) 50 MG tablet  . nitroGLYCERIN (NITROSTAT) 0.4 MG SL tablet  . oxyCODONE-acetaminophen (PERCOCET/ROXICET) 5-325 MG tablet  . pantoprazole (  PROTONIX) 40 MG tablet  . pentosan polysulfate (ELMIRON) 100 MG capsule  . Probiotic Product (PROBIOTIC PO)  . traZODone (DESYREL) 150 MG tablet   No current facility-administered medications for this encounter.    Maia Plan [facility] Pre-Surgical Testing [phone] 02/24/20  10:38 AM

## 2020-02-25 ENCOUNTER — Other Ambulatory Visit (HOSPITAL_COMMUNITY): Payer: PPO

## 2020-02-27 ENCOUNTER — Other Ambulatory Visit (HOSPITAL_COMMUNITY)
Admission: RE | Admit: 2020-02-27 | Discharge: 2020-02-27 | Disposition: A | Discharge status: Home / Self Care | Payer: PPO | Source: Ambulatory Visit | Attending: Orthopedic Surgery | Admitting: Orthopedic Surgery

## 2020-02-27 DIAGNOSIS — Z7902 Long term (current) use of antithrombotics/antiplatelets: Secondary | ICD-10-CM | POA: Diagnosis not present

## 2020-02-27 DIAGNOSIS — F17210 Nicotine dependence, cigarettes, uncomplicated: Secondary | ICD-10-CM | POA: Diagnosis present

## 2020-02-27 DIAGNOSIS — Z96642 Presence of left artificial hip joint: Secondary | ICD-10-CM | POA: Diagnosis not present

## 2020-02-27 DIAGNOSIS — Z20822 Contact with and (suspected) exposure to covid-19: Secondary | ICD-10-CM | POA: Diagnosis present

## 2020-02-27 DIAGNOSIS — Z471 Aftercare following joint replacement surgery: Secondary | ICD-10-CM | POA: Diagnosis not present

## 2020-02-27 DIAGNOSIS — M1612 Unilateral primary osteoarthritis, left hip: Secondary | ICD-10-CM | POA: Diagnosis present

## 2020-02-27 DIAGNOSIS — Z7951 Long term (current) use of inhaled steroids: Secondary | ICD-10-CM | POA: Diagnosis not present

## 2020-02-27 DIAGNOSIS — K219 Gastro-esophageal reflux disease without esophagitis: Secondary | ICD-10-CM | POA: Diagnosis present

## 2020-02-27 DIAGNOSIS — Z955 Presence of coronary angioplasty implant and graft: Secondary | ICD-10-CM | POA: Diagnosis not present

## 2020-02-27 DIAGNOSIS — J45909 Unspecified asthma, uncomplicated: Secondary | ICD-10-CM | POA: Diagnosis not present

## 2020-02-27 DIAGNOSIS — I2510 Atherosclerotic heart disease of native coronary artery without angina pectoris: Secondary | ICD-10-CM | POA: Diagnosis present

## 2020-02-27 DIAGNOSIS — D62 Acute posthemorrhagic anemia: Secondary | ICD-10-CM | POA: Diagnosis not present

## 2020-02-27 DIAGNOSIS — Z79899 Other long term (current) drug therapy: Secondary | ICD-10-CM | POA: Diagnosis not present

## 2020-02-27 DIAGNOSIS — I252 Old myocardial infarction: Secondary | ICD-10-CM | POA: Diagnosis not present

## 2020-02-27 DIAGNOSIS — Z951 Presence of aortocoronary bypass graft: Secondary | ICD-10-CM | POA: Diagnosis not present

## 2020-02-27 DIAGNOSIS — E785 Hyperlipidemia, unspecified: Secondary | ICD-10-CM | POA: Diagnosis present

## 2020-02-27 LAB — SARS CORONAVIRUS 2 (TAT 6-24 HRS): SARS Coronavirus 2: NEGATIVE

## 2020-02-28 NOTE — Anesthesia Preprocedure Evaluation (Addendum)
Anesthesia Evaluation  Patient identified by MRN, date of birth, ID band Patient awake    Reviewed: Allergy & Precautions, NPO status , Patient's Chart, lab work & pertinent test results  Airway Mallampati: II  TM Distance: >3 FB Neck ROM: Full    Dental no notable dental hx.    Pulmonary Current Smoker and Patient abstained from smoking.,    Pulmonary exam normal breath sounds clear to auscultation       Cardiovascular + CAD, + Past MI, + Cardiac Stents and + CABG  Normal cardiovascular exam Rhythm:Regular Rate:Normal  ECG: SR, rate 66   Neuro/Psych PSYCHIATRIC DISORDERS Depression negative neurological ROS     GI/Hepatic Neg liver ROS, GERD  Medicated and Controlled,  Endo/Other  negative endocrine ROS  Renal/GU negative Renal ROS     Musculoskeletal  (+) Arthritis , Chronic lower back pain   Abdominal   Peds  Hematology HLD   Anesthesia Other Findings left hip osteoarthritis  Reproductive/Obstetrics                            Anesthesia Physical Anesthesia Plan  ASA: III  Anesthesia Plan: General   Post-op Pain Management:    Induction: Intravenous  PONV Risk Score and Plan: 2 and Ondansetron, Dexamethasone and Treatment may vary due to age or medical condition  Airway Management Planned: Oral ETT  Additional Equipment:   Intra-op Plan:   Post-operative Plan: Extubation in OR  Informed Consent: I have reviewed the patients History and Physical, chart, labs and discussed the procedure including the risks, benefits and alternatives for the proposed anesthesia with the patient or authorized representative who has indicated his/her understanding and acceptance.     Dental advisory given  Plan Discussed with: CRNA  Anesthesia Plan Comments: (Reviewed note by anesthesia PA-C.)       Anesthesia Quick Evaluation

## 2020-02-29 ENCOUNTER — Observation Stay (HOSPITAL_COMMUNITY): Payer: PPO

## 2020-02-29 ENCOUNTER — Inpatient Hospital Stay (HOSPITAL_COMMUNITY): Payer: PPO | Admitting: Registered Nurse

## 2020-02-29 ENCOUNTER — Inpatient Hospital Stay (HOSPITAL_COMMUNITY)
Admission: RE | Admit: 2020-02-29 | Discharge: 2020-03-02 | DRG: 470 | Disposition: A | Discharge status: Home / Self Care | Payer: PPO | Attending: Orthopedic Surgery | Admitting: Orthopedic Surgery

## 2020-02-29 ENCOUNTER — Inpatient Hospital Stay (HOSPITAL_COMMUNITY): Payer: PPO

## 2020-02-29 ENCOUNTER — Encounter (HOSPITAL_COMMUNITY): Payer: Self-pay | Admitting: Orthopedic Surgery

## 2020-02-29 ENCOUNTER — Encounter (HOSPITAL_COMMUNITY)
Admission: RE | Disposition: A | Discharge status: Home / Self Care | Payer: Self-pay | Source: Home / Self Care | Attending: Orthopedic Surgery

## 2020-02-29 ENCOUNTER — Inpatient Hospital Stay (HOSPITAL_COMMUNITY): Payer: PPO | Admitting: Physician Assistant

## 2020-02-29 ENCOUNTER — Other Ambulatory Visit: Payer: Self-pay

## 2020-02-29 DIAGNOSIS — F17210 Nicotine dependence, cigarettes, uncomplicated: Secondary | ICD-10-CM | POA: Diagnosis present

## 2020-02-29 DIAGNOSIS — M1612 Unilateral primary osteoarthritis, left hip: Principal | ICD-10-CM | POA: Diagnosis present

## 2020-02-29 DIAGNOSIS — Z96649 Presence of unspecified artificial hip joint: Secondary | ICD-10-CM

## 2020-02-29 DIAGNOSIS — Z7902 Long term (current) use of antithrombotics/antiplatelets: Secondary | ICD-10-CM

## 2020-02-29 DIAGNOSIS — I252 Old myocardial infarction: Secondary | ICD-10-CM

## 2020-02-29 DIAGNOSIS — Z96642 Presence of left artificial hip joint: Secondary | ICD-10-CM | POA: Diagnosis not present

## 2020-02-29 DIAGNOSIS — Z419 Encounter for procedure for purposes other than remedying health state, unspecified: Secondary | ICD-10-CM

## 2020-02-29 DIAGNOSIS — E785 Hyperlipidemia, unspecified: Secondary | ICD-10-CM | POA: Diagnosis present

## 2020-02-29 DIAGNOSIS — K219 Gastro-esophageal reflux disease without esophagitis: Secondary | ICD-10-CM | POA: Diagnosis present

## 2020-02-29 DIAGNOSIS — Z955 Presence of coronary angioplasty implant and graft: Secondary | ICD-10-CM

## 2020-02-29 DIAGNOSIS — Z20822 Contact with and (suspected) exposure to covid-19: Secondary | ICD-10-CM | POA: Diagnosis present

## 2020-02-29 DIAGNOSIS — Z951 Presence of aortocoronary bypass graft: Secondary | ICD-10-CM

## 2020-02-29 DIAGNOSIS — Z471 Aftercare following joint replacement surgery: Secondary | ICD-10-CM | POA: Diagnosis not present

## 2020-02-29 DIAGNOSIS — Z7951 Long term (current) use of inhaled steroids: Secondary | ICD-10-CM

## 2020-02-29 DIAGNOSIS — I2510 Atherosclerotic heart disease of native coronary artery without angina pectoris: Secondary | ICD-10-CM | POA: Diagnosis present

## 2020-02-29 DIAGNOSIS — Z79899 Other long term (current) drug therapy: Secondary | ICD-10-CM

## 2020-02-29 HISTORY — PX: TOTAL HIP ARTHROPLASTY: SHX124

## 2020-02-29 LAB — TYPE AND SCREEN
ABO/RH(D): O POS
Antibody Screen: NEGATIVE

## 2020-02-29 SURGERY — ARTHROPLASTY, HIP, TOTAL, ANTERIOR APPROACH
Anesthesia: General | Site: Hip | Laterality: Left

## 2020-02-29 MED ORDER — DOCUSATE SODIUM 100 MG PO CAPS
100.0000 mg | ORAL_CAPSULE | Freq: Two times a day (BID) | ORAL | Status: DC
  Administered 2020-02-29 – 2020-03-02 (×4): 100 mg via ORAL
  Filled 2020-02-29 (×4): qty 1

## 2020-02-29 MED ORDER — ROCURONIUM BROMIDE 10 MG/ML (PF) SYRINGE
PREFILLED_SYRINGE | INTRAVENOUS | Status: AC
  Filled 2020-02-29: qty 10

## 2020-02-29 MED ORDER — MIDAZOLAM HCL 2 MG/2ML IJ SOLN
INTRAMUSCULAR | Status: AC
  Filled 2020-02-29: qty 2

## 2020-02-29 MED ORDER — BISACODYL 10 MG RE SUPP: 10.0000 mg | Freq: Every day | RECTAL | Status: DC | PRN

## 2020-02-29 MED ORDER — BUPIVACAINE HCL 0.25 % IJ SOLN
INTRAMUSCULAR | Status: DC | PRN
  Administered 2020-02-29: 30 mL

## 2020-02-29 MED ORDER — ATORVASTATIN CALCIUM 40 MG PO TABS
80.0000 mg | ORAL_TABLET | Freq: Every day | ORAL | Status: DC
  Administered 2020-03-01: 80 mg via ORAL
  Filled 2020-02-29: qty 2

## 2020-02-29 MED ORDER — CHLORHEXIDINE GLUCONATE 0.12 % MT SOLN
15.0000 mL | Freq: Once | OROMUCOSAL | Status: AC
  Administered 2020-02-29: 15 mL via OROMUCOSAL

## 2020-02-29 MED ORDER — HYDROMORPHONE HCL 1 MG/ML IJ SOLN
0.5000 mg | INTRAMUSCULAR | Status: AC | PRN
  Administered 2020-02-29 (×2): 0.5 mg via INTRAVENOUS

## 2020-02-29 MED ORDER — EPHEDRINE 5 MG/ML INJ
INTRAVENOUS | Status: AC
  Filled 2020-02-29: qty 10

## 2020-02-29 MED ORDER — HYDROMORPHONE HCL 2 MG PO TABS
4.0000 mg | ORAL_TABLET | ORAL | Status: DC | PRN
  Administered 2020-02-29 – 2020-03-01 (×5): 4 mg via ORAL
  Filled 2020-02-29 (×6): qty 2

## 2020-02-29 MED ORDER — EPHEDRINE SULFATE 50 MG/ML IJ SOLN
INTRAMUSCULAR | Status: DC | PRN
  Administered 2020-02-29: 5 mg via INTRAVENOUS

## 2020-02-29 MED ORDER — PROPOFOL 10 MG/ML IV BOLUS
INTRAVENOUS | Status: AC
  Filled 2020-02-29: qty 20

## 2020-02-29 MED ORDER — PROPOFOL 1000 MG/100ML IV EMUL
INTRAVENOUS | Status: AC
  Filled 2020-02-29: qty 100

## 2020-02-29 MED ORDER — HYDROMORPHONE HCL 2 MG PO TABS: 2.0000 mg | ORAL_TABLET | ORAL | Status: DC | PRN

## 2020-02-29 MED ORDER — OXYCODONE HCL 5 MG PO TABS
ORAL_TABLET | ORAL | Status: AC
  Filled 2020-02-29: qty 2

## 2020-02-29 MED ORDER — 0.9 % SODIUM CHLORIDE (POUR BTL) OPTIME
TOPICAL | Status: DC | PRN
  Administered 2020-02-29: 1000 mL

## 2020-02-29 MED ORDER — DEXAMETHASONE SODIUM PHOSPHATE 10 MG/ML IJ SOLN
INTRAMUSCULAR | Status: AC
  Filled 2020-02-29: qty 1

## 2020-02-29 MED ORDER — HYDROMORPHONE HCL 1 MG/ML IJ SOLN
INTRAMUSCULAR | Status: AC
  Filled 2020-02-29: qty 1

## 2020-02-29 MED ORDER — METHOCARBAMOL 500 MG IVPB - SIMPLE MED
500.0000 mg | Freq: Four times a day (QID) | INTRAVENOUS | Status: DC | PRN
  Administered 2020-02-29: 500 mg via INTRAVENOUS
  Filled 2020-02-29: qty 50

## 2020-02-29 MED ORDER — ALBUTEROL SULFATE (2.5 MG/3ML) 0.083% IN NEBU: 3.0000 mL | INHALATION_SOLUTION | Freq: Four times a day (QID) | RESPIRATORY_TRACT | Status: DC | PRN

## 2020-02-29 MED ORDER — CEFAZOLIN SODIUM-DEXTROSE 2-4 GM/100ML-% IV SOLN
2.0000 g | INTRAVENOUS | Status: AC
  Administered 2020-02-29: 2 g via INTRAVENOUS
  Filled 2020-02-29: qty 100

## 2020-02-29 MED ORDER — SODIUM CHLORIDE 0.9 % IV SOLN: INTRAVENOUS | Status: DC

## 2020-02-29 MED ORDER — WATER FOR IRRIGATION, STERILE IR SOLN
Status: DC | PRN
  Administered 2020-02-29: 2000 mL

## 2020-02-29 MED ORDER — METOCLOPRAMIDE HCL 5 MG/ML IJ SOLN: 5.0000 mg | Freq: Three times a day (TID) | INTRAMUSCULAR | Status: DC | PRN

## 2020-02-29 MED ORDER — ONDANSETRON HCL 4 MG/2ML IJ SOLN
INTRAMUSCULAR | Status: AC
  Filled 2020-02-29: qty 2

## 2020-02-29 MED ORDER — LACTATED RINGERS IV SOLN: INTRAVENOUS | Status: DC

## 2020-02-29 MED ORDER — METHOCARBAMOL 500 MG IVPB - SIMPLE MED
INTRAVENOUS | Status: AC
  Filled 2020-02-29: qty 50

## 2020-02-29 MED ORDER — FENTANYL CITRATE (PF) 100 MCG/2ML IJ SOLN
INTRAMUSCULAR | Status: AC
  Filled 2020-02-29: qty 2

## 2020-02-29 MED ORDER — FLUOXETINE HCL 20 MG PO CAPS
60.0000 mg | ORAL_CAPSULE | Freq: Every day | ORAL | Status: DC
  Administered 2020-02-29 – 2020-03-02 (×3): 60 mg via ORAL
  Filled 2020-02-29 (×3): qty 3

## 2020-02-29 MED ORDER — ONDANSETRON HCL 4 MG PO TABS: 4.0000 mg | ORAL_TABLET | Freq: Four times a day (QID) | ORAL | Status: DC | PRN

## 2020-02-29 MED ORDER — OXYCODONE HCL 5 MG PO TABS: 5.0000 mg | ORAL_TABLET | ORAL | Status: DC | PRN

## 2020-02-29 MED ORDER — ONDANSETRON HCL 4 MG/2ML IJ SOLN: 4.0000 mg | Freq: Four times a day (QID) | INTRAMUSCULAR | Status: DC | PRN

## 2020-02-29 MED ORDER — MENTHOL 3 MG MT LOZG: 1.0000 | LOZENGE | OROMUCOSAL | Status: DC | PRN

## 2020-02-29 MED ORDER — PROPOFOL 10 MG/ML IV BOLUS
INTRAVENOUS | Status: DC | PRN
  Administered 2020-02-29: 100 mg via INTRAVENOUS

## 2020-02-29 MED ORDER — LIDOCAINE 2% (20 MG/ML) 5 ML SYRINGE
INTRAMUSCULAR | Status: DC | PRN
  Administered 2020-02-29: 60 mg via INTRAVENOUS

## 2020-02-29 MED ORDER — PHENYLEPHRINE HCL-NACL 10-0.9 MG/250ML-% IV SOLN
INTRAVENOUS | Status: AC
  Filled 2020-02-29: qty 250

## 2020-02-29 MED ORDER — ACETAMINOPHEN 500 MG PO TABS: 1000.0000 mg | ORAL_TABLET | Freq: Once | ORAL | Status: AC

## 2020-02-29 MED ORDER — TRAZODONE HCL 50 MG PO TABS
150.0000 mg | ORAL_TABLET | Freq: Every day | ORAL | Status: DC
  Administered 2020-02-29 – 2020-03-01 (×2): 150 mg via ORAL
  Filled 2020-02-29 (×2): qty 1

## 2020-02-29 MED ORDER — LIDOCAINE 2% (20 MG/ML) 5 ML SYRINGE
INTRAMUSCULAR | Status: AC
  Filled 2020-02-29: qty 5

## 2020-02-29 MED ORDER — ACETAMINOPHEN 325 MG PO TABS
325.0000 mg | ORAL_TABLET | Freq: Four times a day (QID) | ORAL | Status: DC | PRN
  Administered 2020-03-01 – 2020-03-02 (×3): 650 mg via ORAL
  Filled 2020-02-29 (×3): qty 2

## 2020-02-29 MED ORDER — ASPIRIN EC 81 MG PO TBEC
81.0000 mg | DELAYED_RELEASE_TABLET | Freq: Every day | ORAL | Status: DC
  Administered 2020-03-01 – 2020-03-02 (×2): 81 mg via ORAL
  Filled 2020-02-29 (×2): qty 1

## 2020-02-29 MED ORDER — PHENOL 1.4 % MT LIQD: 1.0000 | OROMUCOSAL | Status: DC | PRN

## 2020-02-29 MED ORDER — DEXAMETHASONE SODIUM PHOSPHATE 10 MG/ML IJ SOLN
10.0000 mg | Freq: Once | INTRAMUSCULAR | Status: AC
  Administered 2020-03-01: 10 mg via INTRAVENOUS
  Filled 2020-02-29: qty 1

## 2020-02-29 MED ORDER — OXYCODONE HCL 5 MG PO TABS
10.0000 mg | ORAL_TABLET | ORAL | Status: DC | PRN
  Administered 2020-02-29: 10 mg via ORAL

## 2020-02-29 MED ORDER — CLOPIDOGREL BISULFATE 75 MG PO TABS
75.0000 mg | ORAL_TABLET | Freq: Every day | ORAL | Status: DC
  Administered 2020-03-01 – 2020-03-02 (×2): 75 mg via ORAL
  Filled 2020-02-29 (×2): qty 1

## 2020-02-29 MED ORDER — ACETAMINOPHEN 10 MG/ML IV SOLN
1000.0000 mg | Freq: Four times a day (QID) | INTRAVENOUS | Status: DC
  Administered 2020-02-29: 1000 mg via INTRAVENOUS
  Filled 2020-02-29: qty 100

## 2020-02-29 MED ORDER — ROCURONIUM BROMIDE 10 MG/ML (PF) SYRINGE
PREFILLED_SYRINGE | INTRAVENOUS | Status: DC | PRN
  Administered 2020-02-29: 40 mg via INTRAVENOUS
  Administered 2020-02-29: 20 mg via INTRAVENOUS

## 2020-02-29 MED ORDER — CEFAZOLIN SODIUM-DEXTROSE 2-4 GM/100ML-% IV SOLN
2.0000 g | Freq: Four times a day (QID) | INTRAVENOUS | Status: AC
  Administered 2020-02-29 (×2): 2 g via INTRAVENOUS
  Filled 2020-02-29 (×2): qty 100

## 2020-02-29 MED ORDER — BUPIVACAINE HCL 0.25 % IJ SOLN
INTRAMUSCULAR | Status: AC
  Filled 2020-02-29: qty 1

## 2020-02-29 MED ORDER — MORPHINE SULFATE (PF) 4 MG/ML IV SOLN
0.5000 mg | INTRAVENOUS | Status: DC | PRN
  Administered 2020-02-29: 1 mg via INTRAVENOUS

## 2020-02-29 MED ORDER — FENTANYL CITRATE (PF) 100 MCG/2ML IJ SOLN
INTRAMUSCULAR | Status: AC
  Filled 2020-02-29: qty 4

## 2020-02-29 MED ORDER — POLYETHYLENE GLYCOL 3350 17 G PO PACK: 17.0000 g | PACK | Freq: Every day | ORAL | Status: DC | PRN

## 2020-02-29 MED ORDER — METOCLOPRAMIDE HCL 5 MG PO TABS: 5.0000 mg | ORAL_TABLET | Freq: Three times a day (TID) | ORAL | Status: DC | PRN

## 2020-02-29 MED ORDER — POVIDONE-IODINE 10 % EX SWAB
2.0000 "application " | Freq: Once | CUTANEOUS | Status: AC
  Administered 2020-02-29: 2 via TOPICAL

## 2020-02-29 MED ORDER — ONDANSETRON HCL 4 MG/2ML IJ SOLN
INTRAMUSCULAR | Status: DC | PRN
  Administered 2020-02-29: 4 mg via INTRAVENOUS

## 2020-02-29 MED ORDER — MAGNESIUM CITRATE PO SOLN: 1.0000 | Freq: Once | ORAL | Status: DC | PRN

## 2020-02-29 MED ORDER — METHOCARBAMOL 500 MG PO TABS
500.0000 mg | ORAL_TABLET | Freq: Four times a day (QID) | ORAL | Status: DC | PRN
  Administered 2020-02-29 – 2020-03-02 (×6): 500 mg via ORAL
  Filled 2020-02-29 (×6): qty 1

## 2020-02-29 MED ORDER — MORPHINE SULFATE (PF) 4 MG/ML IV SOLN
INTRAVENOUS | Status: AC
  Filled 2020-02-29: qty 1

## 2020-02-29 MED ORDER — SUGAMMADEX SODIUM 200 MG/2ML IV SOLN
INTRAVENOUS | Status: DC | PRN
  Administered 2020-02-29: 200 mg via INTRAVENOUS

## 2020-02-29 MED ORDER — ONDANSETRON HCL 4 MG/2ML IJ SOLN: 4.0000 mg | Freq: Once | INTRAMUSCULAR | Status: DC | PRN

## 2020-02-29 MED ORDER — FENTANYL CITRATE (PF) 100 MCG/2ML IJ SOLN
25.0000 ug | INTRAMUSCULAR | Status: DC | PRN
  Administered 2020-02-29: 25 ug via INTRAVENOUS
  Administered 2020-02-29: 50 ug via INTRAVENOUS
  Administered 2020-02-29: 25 ug via INTRAVENOUS
  Administered 2020-02-29: 50 ug via INTRAVENOUS

## 2020-02-29 MED ORDER — MORPHINE SULFATE (PF) 2 MG/ML IV SOLN
1.0000 mg | INTRAVENOUS | Status: DC | PRN
  Administered 2020-02-29 – 2020-03-01 (×3): 2 mg via INTRAVENOUS
  Filled 2020-02-29 (×4): qty 1

## 2020-02-29 MED ORDER — DEXAMETHASONE SODIUM PHOSPHATE 10 MG/ML IJ SOLN
8.0000 mg | Freq: Once | INTRAMUSCULAR | Status: AC
  Administered 2020-02-29: 8 mg via INTRAVENOUS

## 2020-02-29 MED ORDER — FENTANYL CITRATE (PF) 100 MCG/2ML IJ SOLN
INTRAMUSCULAR | Status: DC | PRN
  Administered 2020-02-29 (×4): 50 ug via INTRAVENOUS

## 2020-02-29 MED ORDER — ORAL CARE MOUTH RINSE: 15.0000 mL | Freq: Once | OROMUCOSAL | Status: AC

## 2020-02-29 MED ORDER — MIDAZOLAM HCL 5 MG/5ML IJ SOLN
INTRAMUSCULAR | Status: DC | PRN
  Administered 2020-02-29: 2 mg via INTRAVENOUS

## 2020-02-29 MED ORDER — TRANEXAMIC ACID-NACL 1000-0.7 MG/100ML-% IV SOLN
1000.0000 mg | INTRAVENOUS | Status: AC
  Administered 2020-02-29: 1000 mg via INTRAVENOUS
  Filled 2020-02-29: qty 100

## 2020-02-29 SURGICAL SUPPLY — 46 items
BAG DECANTER FOR FLEXI CONT (MISCELLANEOUS) IMPLANT
BAG ZIPLOCK 12X15 (MISCELLANEOUS) IMPLANT
BLADE SAG 18X100X1.27 (BLADE) ×3 IMPLANT
CLOSURE WOUND 1/2 X4 (GAUZE/BANDAGES/DRESSINGS) ×2
COVER PERINEAL POST (MISCELLANEOUS) ×3 IMPLANT
COVER SURGICAL LIGHT HANDLE (MISCELLANEOUS) ×3 IMPLANT
COVER WAND RF STERILE (DRAPES) IMPLANT
CUP ACET PINNACLE SECTR 50MM (Hips) ×1 IMPLANT
DECANTER SPIKE VIAL GLASS SM (MISCELLANEOUS) ×3 IMPLANT
DRAPE STERI IOBAN 125X83 (DRAPES) ×3 IMPLANT
DRAPE U-SHAPE 47X51 STRL (DRAPES) ×6 IMPLANT
DRSG ADAPTIC 3X8 NADH LF (GAUZE/BANDAGES/DRESSINGS) ×3 IMPLANT
DRSG AQUACEL AG ADV 3.5X10 (GAUZE/BANDAGES/DRESSINGS) ×3 IMPLANT
DURAPREP 26ML APPLICATOR (WOUND CARE) ×3 IMPLANT
ELECT REM PT RETURN 15FT ADLT (MISCELLANEOUS) ×3 IMPLANT
EVACUATOR 1/8 PVC DRAIN (DRAIN) IMPLANT
GLOVE BIO SURGEON STRL SZ 6 (GLOVE) IMPLANT
GLOVE BIO SURGEON STRL SZ7 (GLOVE) IMPLANT
GLOVE BIO SURGEON STRL SZ8 (GLOVE) ×3 IMPLANT
GLOVE BIOGEL PI IND STRL 6.5 (GLOVE) IMPLANT
GLOVE BIOGEL PI IND STRL 7.0 (GLOVE) IMPLANT
GLOVE BIOGEL PI IND STRL 8 (GLOVE) ×1 IMPLANT
GLOVE BIOGEL PI INDICATOR 6.5 (GLOVE)
GLOVE BIOGEL PI INDICATOR 7.0 (GLOVE)
GLOVE BIOGEL PI INDICATOR 8 (GLOVE) ×2
GOWN STRL REUS W/TWL LRG LVL3 (GOWN DISPOSABLE) ×3 IMPLANT
GOWN STRL REUS W/TWL XL LVL3 (GOWN DISPOSABLE) IMPLANT
HEAD FEMORAL 32 CERAMIC (Hips) ×3 IMPLANT
HOLDER FOLEY CATH W/STRAP (MISCELLANEOUS) ×3 IMPLANT
KIT TURNOVER KIT A (KITS) IMPLANT
LINER MARATHON 32 50 (Hips) ×3 IMPLANT
MANIFOLD NEPTUNE II (INSTRUMENTS) ×3 IMPLANT
PACK ANTERIOR HIP CUSTOM (KITS) ×3 IMPLANT
PENCIL SMOKE EVACUATOR COATED (MISCELLANEOUS) ×3 IMPLANT
PINNACLE SECTOR CUP 50MM (Hips) ×3 IMPLANT
STEM FEM ACTIS HIGH SZ3 (Stem) ×3 IMPLANT
STRIP CLOSURE SKIN 1/2X4 (GAUZE/BANDAGES/DRESSINGS) ×4 IMPLANT
SUT ETHIBOND NAB CT1 #1 30IN (SUTURE) ×3 IMPLANT
SUT MNCRL AB 4-0 PS2 18 (SUTURE) ×3 IMPLANT
SUT STRATAFIX 0 PDS 27 VIOLET (SUTURE) ×3
SUT VIC AB 2-0 CT1 27 (SUTURE) ×6
SUT VIC AB 2-0 CT1 TAPERPNT 27 (SUTURE) ×2 IMPLANT
SUTURE STRATFX 0 PDS 27 VIOLET (SUTURE) ×1 IMPLANT
SYR 50ML LL SCALE MARK (SYRINGE) IMPLANT
TRAY FOLEY MTR SLVR 16FR STAT (SET/KITS/TRAYS/PACK) ×3 IMPLANT
YANKAUER SUCT BULB TIP 10FT TU (MISCELLANEOUS) ×3 IMPLANT

## 2020-02-29 NOTE — Transfer of Care (Signed)
Immediate Anesthesia Transfer of Care Note  Patient: Bianca [name]  Procedure(s) Performed: TOTAL HIP ARTHROPLASTY ANTERIOR APPROACH (Left Hip)  Patient Location: PACU  Anesthesia Type:General  Level of Consciousness: sedated  Airway & Oxygen Therapy: Patient Spontanous Breathing and Patient connected to face mask oxygen  Post-op Assessment: Report given to RN and Post -op Vital signs reviewed and stable  Post vital signs: Reviewed and stable  Last Vitals:  Vitals Value Taken Time  BP 161/81 02/29/20 1002  Temp    Pulse 63 02/29/20 1003  Resp 19 02/29/20 1003  SpO2 100 % 02/29/20 1003  Vitals shown include unvalidated device data.  Last Pain:  Vitals:   02/29/20 0649  TempSrc:   PainSc: 0-No pain         Complications: No complications documented.

## 2020-02-29 NOTE — Discharge Instructions (Signed)
Gaynelle Arabian, MD Total Joint Specialist [facility] [place] [address], [address] [place], [place] [address] [phone]  ANTERIOR APPROACH TOTAL HIP REPLACEMENT POSTOPERATIVE DIRECTIONS     Hip Rehabilitation, Guidelines Following Surgery  The results of a hip operation are greatly improved after range of motion and muscle strengthening exercises. Follow all safety measures which are given to protect your hip. If any of these exercises cause increased pain or swelling in your joint, decrease the amount until you are comfortable again. Then slowly increase the exercises. Call your caregiver if you have problems or questions.   BLOOD CLOT PREVENTION . Take an 81 mg Aspirin once a day for six weeks following surgery with your usual Plavix dose. Dennis Bast may resume your vitamins/supplements upon discharge from the hospital.  [place]  . Remove items at home which could result in a fall. This includes throw rugs or furniture in walking pathways.   ICE to the affected hip as frequently as 20-30 minutes an hour and then as needed for pain and swelling. Continue to use ice on the hip for pain and swelling from surgery. You may notice swelling that will progress down to the foot and ankle. This is normal after surgery. Elevate the leg when you are not up walking on it.    Continue to use the breathing machine which will help keep your temperature down.  It is common for your temperature to cycle up and down following surgery, especially at night when you are not up moving around and exerting yourself.  The breathing machine keeps your lungs expanded and your temperature down.  DIET You may resume your previous home diet once your are discharged from the hospital.  DRESSING / WOUND CARE / SHOWERING . You have an adhesive waterproof bandage over the incision. Leave this in place until your first follow-up appointment. Once you remove this you will not need to place  another bandage.  . You may begin showering 3 days following surgery, but do not submerge the incision under water.  ACTIVITY . For the first 3-5 days, it is important to rest and keep the operative leg elevated. You should, as a general rule, rest for 50 minutes and walk/stretch for 10 minutes per hour. After 5 days, you may slowly increase activity as tolerated.  Marland Kitchen Perform the exercises you were provided twice a day for about 15-20 minutes each session. Begin these 2 days following surgery. . Walk with your walker as instructed. Use the walker until you are comfortable transitioning to a cane. Walk with the cane in the opposite hand of the operative leg. You may discontinue the cane once you are comfortable and walking steadily. . Avoid periods of inactivity such as sitting longer than an hour when not asleep. This helps prevent blood clots.  . Do not drive a car for 6 weeks or until released by your surgeon.  . Do not drive while taking narcotics.  TED HOSE STOCKINGS Wear the elastic stockings on both legs for three weeks following surgery during the day. You may remove them at night while sleeping.  WEIGHT BEARING Weight bearing as tolerated with assist device (walker, cane, etc) as directed, use it as long as suggested by your surgeon or therapist, typically at least 4-6 weeks.  POSTOPERATIVE CONSTIPATION PROTOCOL Constipation - defined medically as fewer than three stools per week and severe constipation as less than one stool per week.  One of the most common issues patients have following  surgery is constipation.  Even if you have a regular bowel pattern at home, your normal regimen is likely to be disrupted due to multiple reasons following surgery.  Combination of anesthesia, postoperative narcotics, change in appetite and fluid intake all can affect your bowels.  In order to avoid complications following surgery, here are some recommendations in order to help you during your recovery  period.  . Colace (docusate) - Pick up an over-the-counter form of Colace or another stool softener and take twice a day as long as you are requiring postoperative pain medications.  Take with a full glass of water daily.  If you experience loose stools or diarrhea, hold the colace until you stool forms back up.  If your symptoms do not get better within 1 week or if they get worse, check with your doctor. . Dulcolax (bisacodyl) - Pick up over-the-counter and take as directed by the product packaging as needed to assist with the movement of your bowels.  Take with a full glass of water.  Use this product as needed if not relieved by Colace only.  . MiraLax (polyethylene glycol) - Pick up over-the-counter to have on hand.  MiraLax is a solution that will increase the amount of water in your bowels to assist with bowel movements.  Take as directed and can mix with a glass of water, juice, soda, coffee, or tea.  Take if you go more than two days without a movement.Do not use MiraLax more than once per day. Call your doctor if you are still constipated or irregular after using this medication for 7 days in a row.  If you continue to have problems with postoperative constipation, please contact the office for further assistance and recommendations.  If you experience "the worst abdominal pain ever" or develop nausea or vomiting, please contact the office immediatly for further recommendations for treatment.  ITCHING  If you experience itching with your medications, try taking only a single pain pill, or even half a pain pill at a time.  You can also use Benadryl over the counter for itching or also to help with sleep.   MEDICATIONS See your medication summary on the "After Visit Summary" that the nursing staff will review with you prior to discharge.  You may have some home medications which will be placed on hold until you complete the course of blood thinner medication.  It is important for you to complete  the blood thinner medication as prescribed by your surgeon.  Continue your approved medications as instructed at time of discharge.  PRECAUTIONS If you experience chest pain or shortness of breath - call 911 immediately for transfer to the hospital emergency department.  If you develop a fever greater that 101 F, purulent drainage from wound, increased redness or drainage from wound, foul odor from the wound/dressing, or calf pain - CONTACT YOUR SURGEON.                                                   FOLLOW-UP APPOINTMENTS Make sure you keep all of your appointments after your operation with your surgeon and caregivers. You should call the office at the above phone number and make an appointment for approximately two weeks after the date of your surgery or on the date instructed by your surgeon outlined in the "After Visit Summary".  RANGE  OF MOTION AND STRENGTHENING EXERCISES  These exercises are designed to help you keep full movement of your hip joint. Follow your caregiver's or physical therapist's instructions. Perform all exercises about fifteen times, three times per day or as directed. Exercise both hips, even if you have had only one joint replacement. These exercises can be done on a training (exercise) mat, on the floor, on a table or on a bed. Use whatever works the best and is most comfortable for you. Use music or television while you are exercising so that the exercises are a pleasant break in your day. This will make your life better with the exercises acting as a break in routine you can look forward to.  . Lying on your back, slowly slide your foot toward your buttocks, raising your knee up off the floor. Then slowly slide your foot back down until your leg is straight again.  . Lying on your back spread your legs as far apart as you can without causing discomfort.  . Lying on your side, raise your upper leg and foot straight up from the floor as far as is comfortable. Slowly lower  the leg and repeat.  . Lying on your back, tighten up the muscle in the front of your thigh (quadriceps muscles). You can do this by keeping your leg straight and trying to raise your heel off the floor. This helps strengthen the largest muscle supporting your knee.  . Lying on your back, tighten up the muscles of your buttocks both with the legs straight and with the knee bent at a comfortable angle while keeping your heel on the floor.   IF YOU ARE TRANSFERRED TO A SKILLED REHAB FACILITY If the patient is transferred to a skilled rehab facility following release from the hospital, a list of the current medications will be sent to the facility for the patient to continue.  When discharged from the skilled rehab facility, please have the facility set up the patient's [place] prior to being released. Also, the skilled facility will be responsible for providing the patient with their medications at time of release from the facility to include their pain medication, the muscle relaxants, and their blood thinner medication. If the patient is still at the rehab facility at time of the two week follow up appointment, the skilled rehab facility will also need to assist the patient in arranging follow up appointment in our office and any transportation needs.  MAKE SURE YOU:  . Understand these instructions.  . Get help right away if you are not doing well or get worse.    DENTAL ANTIBIOTICS:  In most cases prophylactic antibiotics for Dental procdeures after total joint surgery are not necessary.  Exceptions are as follows:  1. History of prior total joint infection  2. Severely immunocompromised (Organ Transplant, cancer chemotherapy, Rheumatoid biologic meds such as [place])  3. Poorly controlled diabetes (A1C &gt; 8.0, blood glucose over 200)  If you have one of these conditions, contact your surgeon for an antibiotic prescription, prior to your dental procedure.    Pick  up stool softner and laxative for home use following surgery while on pain medications. Do not submerge incision under water. Please use good hand washing techniques while changing dressing each day. May shower starting three days after surgery. Please use a clean towel to pat the incision dry following showers. Continue to use ice for pain and swelling after surgery. Do not use any lotions or creams on  the incision until instructed by your surgeon.

## 2020-02-29 NOTE — Evaluation (Signed)
Physical Therapy Evaluation Patient Details Name: Bianca [name] MRN: [id number] DOB: Feb 07, 1946 Today's Date: 02/29/2020   History of Present Illness  Patient is 74 y.o. female s/p Lt THA anterior approach on 02/29/20 with PMH significant for HLD, MI, GERD, CAD, OA, depression, Rt THA in 2015, lumbar surgery, CABG.    Clinical Impression  SOPHIE TAMEZ is a 74 y.o. female POD 0 s/p Lt THA. Patient reports independence with mobility at baseline. Patient is now limited by functional impairments (see PT problem list below) and requires min assist for transfers and gait with RW. Patient was able to ambulate ~15 feet with RW and min assist. Patient limited this session by 9/10 pain in hip. Patient instructed in exercise to facilitate ROM and circulation. Patient will benefit from continued skilled PT interventions to address impairments and progress towards PLOF. Acute PT will follow to progress mobility and stair training in preparation for safe discharge home.     Follow Up Recommendations Follow surgeon's recommendation for DC plan and follow-up therapies    Equipment Recommendations  None recommended by PT    Recommendations for Other Services       Precautions / Restrictions Precautions Precautions: Fall Restrictions Weight Bearing Restrictions: No Other Position/Activity Restrictions: WBAT      Mobility  Bed Mobility Overal bed mobility: Needs Assistance Bed Mobility: Supine to Sit     Supine to sit: Min assist     General bed mobility comments: cues to use bed rail to raise trunk, assist provided. min assist to bring Lt LE to EOB.   Transfers Overall transfer level: Needs assistance Equipment used: Rolling walker (2 wheeled) Transfers: Sit to/from Stand Sit to Stand: Min assist         General transfer comment: cues for technique with RW, assist for power up and to complete rise.  Ambulation/Gait Ambulation/Gait assistance: Min assist Gait Distance (Feet): 15  Feet Assistive device: Rolling walker (2 wheeled) Gait Pattern/deviations: Step-to pattern;Decreased stride length;Decreased stance time - left;Decreased weight shift to left;Antalgic Gait velocity: decresaed   General Gait Details: cues for safe step pattern and proximity to RW. assist to manage walker position.  Stairs       Wheelchair Mobility    Modified Rankin (Stroke Patients Only)       Balance Overall balance assessment: Needs assistance Sitting-balance support: Feet supported Sitting balance-Leahy Scale: Good     Standing balance support: During functional activity;Bilateral upper extremity supported Standing balance-Leahy Scale: Fair              Pertinent Vitals/Pain Pain Assessment: 0-10 Pain Score: 9  Pain Location: Lt hip Pain Descriptors / Indicators: Aching;Discomfort Pain Intervention(s): Monitored during session;Limited activity within patient's tolerance;Repositioned;Ice applied    Home Living Family/patient expects to be discharged to:: Private residence Living Arrangements: Spouse/significant other Available Help at Discharge: Family Type of Home: House Home Access: Stairs to enter Entrance Stairs-Rails: Can reach both Entrance Stairs-Number of Steps: 2+1 Home Layout: Two level;Able to live on main level with bedroom/bathroom;Bed/bath upstairs;1/2 bath on main level Home Equipment: Walker - 2 wheels;Cane - single point;Grab bars - tub/shower;Shower seat      Prior Function Level of Independence: Independent               Hand Dominance   Dominant Hand: Right    Extremity/Trunk Assessment   Upper Extremity Assessment Upper Extremity Assessment: Overall WFL for tasks assessed    Lower Extremity Assessment Lower Extremity Assessment: Overall WFL for tasks assessed  Cervical / Trunk Assessment Cervical / Trunk Assessment: Normal  Communication   Communication: No difficulties  Cognition Arousal/Alertness:  Awake/alert Behavior During Therapy: WFL for tasks assessed/performed Overall Cognitive Status: Within Functional Limits for tasks assessed                 General Comments      Exercises Total Joint Exercises Ankle Circles/Pumps: AROM;Both;20 reps;Seated Quad Sets: AROM;5 reps;Left;Seated Heel Slides: AROM;5 reps;Left;Seated Knee Flexion: AROM;Left;5 reps;Standing   Assessment/Plan    PT Assessment Patient needs continued PT services  PT Problem List Decreased strength;Decreased range of motion;Decreased activity tolerance;Decreased balance;Decreased mobility;Decreased knowledge of use of DME;Pain       PT Treatment Interventions DME instruction;Gait training;Stair training;Functional mobility training;Therapeutic activities;Therapeutic exercise;Balance training;Patient/family education    PT Goals (Current goals can be found in the Care Plan section)  Acute Rehab PT Goals Patient Stated Goal: get back to independence PT Goal Formulation: With patient Time For Goal Achievement: 03/07/20 Potential to Achieve Goals: Good    Frequency 7X/week    AM-PAC PT "6 Clicks" Mobility  Outcome Measure Help needed turning from your back to your side while in a flat bed without using bedrails?: A Little Help needed moving from lying on your back to sitting on the side of a flat bed without using bedrails?: A Little Help needed moving to and from a bed to a chair (including a wheelchair)?: A Little Help needed standing up from a chair using your arms (e.g., wheelchair or bedside chair)?: A Little Help needed to walk in hospital room?: A Little Help needed climbing 3-5 steps with a railing? : A Lot 6 Click Score: 17    End of Session Equipment Utilized During Treatment: Gait belt Activity Tolerance: Patient tolerated treatment well Patient left: in chair;with call bell/phone within reach;with chair alarm set;with family/visitor present Nurse Communication: Mobility status;Patient  requests pain meds PT Visit Diagnosis: Muscle weakness (generalized) (M62.81);Difficulty in walking, not elsewhere classified (R26.2)    Time: 3267-1245 PT Time Calculation (min) (ACUTE ONLY): 22 min   Charges:   PT Evaluation $PT Eval Low Complexity: 1 Low          Verner Mould, DPT Acute Rehabilitation Services  Office [phone] Pager [phone]  02/29/2020 6:24 PM

## 2020-02-29 NOTE — Anesthesia Postprocedure Evaluation (Signed)
Anesthesia Post Note  Patient: NASHLY OLSSON  Procedure(s) Performed: TOTAL HIP ARTHROPLASTY ANTERIOR APPROACH (Left Hip)     Patient location during evaluation: PACU Anesthesia Type: General Level of consciousness: awake and alert Pain management: pain level controlled Vital Signs Assessment: post-procedure vital signs reviewed and stable Respiratory status: spontaneous breathing, nonlabored ventilation, respiratory function stable and patient connected to nasal cannula oxygen Cardiovascular status: blood pressure returned to baseline and stable Postop Assessment: no apparent nausea or vomiting Anesthetic complications: no   No complications documented.  Last Vitals:  Vitals:   02/29/20 1336 02/29/20 1444  BP: (!) 142/76 140/64  Pulse: 77 77  Resp: 17 17  Temp: 36.6 C 36.8 C  SpO2: 95% 95%    Last Pain:  Vitals:   02/29/20 1444  TempSrc: Oral  PainSc:                  Nicki Gracy P Teresina Bugaj

## 2020-02-29 NOTE — Interval H&P Note (Signed)
History and Physical Interval Note:  02/29/2020 6:50 AM  Bianca [name]  has presented today for surgery, with the diagnosis of left hip osteoarthritis.  The various methods of treatment have been discussed with the patient and family. After consideration of risks, benefits and other options for treatment, the patient has consented to  Procedure(s) with comments: [place] (Left) - 167min as a surgical intervention.  The patient's history has been reviewed, patient examined, no change in status, stable for surgery.  I have reviewed the patient's chart and labs.  Questions were answered to the patient's satisfaction.     Pilar Plate Bucky Grigg

## 2020-02-29 NOTE — Anesthesia Procedure Notes (Signed)
Procedure Name: Intubation Date/Time: 02/29/2020 8:28 AM Performed by: Talbot Grumbling, CRNA Pre-anesthesia Checklist: Patient identified, Emergency Drugs available, Suction available and Patient being monitored Patient Re-evaluated:Patient Re-evaluated prior to induction Oxygen Delivery Method: Circle system utilized Preoxygenation: Pre-oxygenation with 100% oxygen Induction Type: IV induction Ventilation: Mask ventilation without difficulty Laryngoscope Size: Mac and 3 Grade View: Grade I Tube type: Oral Tube size: 7.0 mm Number of attempts: 1 Airway Equipment and Method: Stylet Placement Confirmation: ETT inserted through vocal cords under direct vision,  positive ETCO2 and breath sounds checked- equal and bilateral Secured at: 21 cm Tube secured with: Tape Dental Injury: Teeth and Oropharynx as per pre-operative assessment

## 2020-02-29 NOTE — Op Note (Signed)
OPERATIVE REPORT- TOTAL HIP ARTHROPLASTY   PREOPERATIVE DIAGNOSIS: Osteoarthritis of the Left hip.   POSTOPERATIVE DIAGNOSIS: Osteoarthritis of the Left  hip.   PROCEDURE: Left total hip arthroplasty, anterior approach.   SURGEON: Gaynelle Arabian, MD   ASSISTANT: Griffith Citron, PA-C  ANESTHESIA:  General  ESTIMATED BLOOD LOSS:-450 mL    DRAINS: Hemovac x1.   COMPLICATIONS: None   CONDITION: PACU - hemodynamically stable.   BRIEF CLINICAL NOTE: Bianca [name] is a 74 y.o. female who has advanced end-  stage arthritis of their Left  hip with progressively worsening pain and  dysfunction.The patient has failed nonoperative management and presents for  total hip arthroplasty.   PROCEDURE IN DETAIL: After successful administration of spinal  anesthetic, the traction boots for the [facility] bed were placed on both  feet and the patient was placed onto the [facility] bed, boots placed into the leg  holders. The Left hip was then isolated from the perineum with plastic  drapes and prepped and draped in the usual sterile fashion. ASIS and  greater trochanter were marked and a oblique incision was made, starting  at about 1 cm lateral and 2 cm distal to the ASIS and coursing towards  the anterior cortex of the femur. The skin was cut with a 10 blade  through subcutaneous tissue to the level of the fascia overlying the  tensor fascia lata muscle. The fascia was then incised in line with the  incision at the junction of the anterior third and posterior 2/3rd. The  muscle was teased off the fascia and then the interval between the TFL  and the rectus was developed. The Hohmann retractor was then placed at  the top of the femoral neck over the capsule. The vessels overlying the  capsule were cauterized and the fat on top of the capsule was removed.  A Hohmann retractor was then placed anterior underneath the rectus  femoris to give exposure to the entire anterior capsule. A T-shaped   capsulotomy was performed. The edges were tagged and the femoral head  was identified.       Osteophytes are removed off the superior acetabulum.  The femoral neck was then cut in situ with an oscillating saw. Traction  was then applied to the left lower extremity utilizing the [facility]  traction. The femoral head was then removed. Retractors were placed  around the acetabulum and then circumferential removal of the labrum was  performed. Osteophytes were also removed. Reaming starts at 47 mm to  medialize and  Increased in 2 mm increments to 49 mm. We reamed in  approximately 40 degrees of abduction, 20 degrees anteversion. A 50 mm  pinnacle acetabular shell was then impacted in anatomic position under  fluoroscopic guidance with excellent purchase. We did not need to place  any additional dome screws. A 32 mm neutral + 4 marathon liner was then  placed into the acetabular shell.       The femoral lift was then placed along the lateral aspect of the femur  just distal to the vastus ridge. The leg was  externally rotated and capsule  was stripped off the inferior aspect of the femoral neck down to the  level of the lesser trochanter, this was done with electrocautery. The femur was lifted after this was performed. The  leg was then placed in an extended and adducted position essentially delivering the femur. We also removed the capsule superiorly and the piriformis from the piriformis  fossa to gain excellent exposure of the  proximal femur. Rongeur was used to remove some cancellous bone to get  into the lateral portion of the proximal femur for placement of the  initial starter reamer. The starter broaches was placed  the starter broach  and was shown to go down the center of the canal. Broaching  with the Actis system was then performed starting at size 0  coursing  Up to size 3. A size 3 had excellent torsional and rotational  and axial stability. The trial high offset neck was then placed   with a 32 + 1 trial head. The hip was then reduced. We confirmed that  the stem was in the canal both on AP and lateral x-rays. It also has excellent sizing. The hip was reduced with outstanding stability through full extension and full external rotation.. AP pelvis was taken and the leg lengths were measured and found to be equal. Hip was then dislocated again and the femoral head and neck removed. The  femoral broach was removed. Size 3 Actis stem with a high offset  neck was then impacted into the femur following native anteversion. Has  excellent purchase in the canal. Excellent torsional and rotational and  axial stability. It is confirmed to be in the canal on AP and lateral  fluoroscopic views. The 32 + 1 ceramic head was placed and the hip  reduced with outstanding stability. Again AP pelvis was taken and it  confirmed that the leg lengths were equal. The wound was then copiously  irrigated with saline solution and the capsule reattached and repaired  with Ethibond suture. 30 ml of .25% Bupivicaine was  injected into the capsule and into the edge of the tensor fascia lata as well as subcutaneous tissue. The fascia overlying the tensor fascia lata was then closed with a running #1 V-Loc. Subcu was closed with interrupted 2-0 Vicryl and subcuticular running 4-0 Monocryl. Incision was cleaned  and dried. Steri-Strips and a bulky sterile dressing applied. Hemovac  drain was hooked to suction and then the patient was awakened and transported to  recovery in stable condition.        Please note that a surgical assistant was a medical necessity for this procedure to perform it in a safe and expeditious manner. Assistant was necessary to provide appropriate retraction of vital neurovascular structures and to prevent femoral fracture and allow for anatomic placement of the prosthesis.  Gaynelle Arabian, M.D.

## 2020-03-01 ENCOUNTER — Encounter (HOSPITAL_COMMUNITY): Payer: Self-pay | Admitting: Orthopedic Surgery

## 2020-03-01 DIAGNOSIS — K219 Gastro-esophageal reflux disease without esophagitis: Secondary | ICD-10-CM | POA: Diagnosis present

## 2020-03-01 DIAGNOSIS — E785 Hyperlipidemia, unspecified: Secondary | ICD-10-CM | POA: Diagnosis present

## 2020-03-01 DIAGNOSIS — I252 Old myocardial infarction: Secondary | ICD-10-CM | POA: Diagnosis not present

## 2020-03-01 DIAGNOSIS — F17210 Nicotine dependence, cigarettes, uncomplicated: Secondary | ICD-10-CM | POA: Diagnosis present

## 2020-03-01 DIAGNOSIS — Z7951 Long term (current) use of inhaled steroids: Secondary | ICD-10-CM | POA: Diagnosis not present

## 2020-03-01 DIAGNOSIS — Z7902 Long term (current) use of antithrombotics/antiplatelets: Secondary | ICD-10-CM | POA: Diagnosis not present

## 2020-03-01 DIAGNOSIS — M1612 Unilateral primary osteoarthritis, left hip: Secondary | ICD-10-CM | POA: Diagnosis present

## 2020-03-01 DIAGNOSIS — I2510 Atherosclerotic heart disease of native coronary artery without angina pectoris: Secondary | ICD-10-CM | POA: Diagnosis present

## 2020-03-01 DIAGNOSIS — Z79899 Other long term (current) drug therapy: Secondary | ICD-10-CM | POA: Diagnosis not present

## 2020-03-01 DIAGNOSIS — Z20822 Contact with and (suspected) exposure to covid-19: Secondary | ICD-10-CM | POA: Diagnosis present

## 2020-03-01 DIAGNOSIS — Z955 Presence of coronary angioplasty implant and graft: Secondary | ICD-10-CM | POA: Diagnosis not present

## 2020-03-01 DIAGNOSIS — Z951 Presence of aortocoronary bypass graft: Secondary | ICD-10-CM | POA: Diagnosis not present

## 2020-03-01 LAB — CBC
HCT: 32.8 % — ABNORMAL LOW (ref 36.0–46.0)
Hemoglobin: 11.1 g/dL — ABNORMAL LOW (ref 12.0–15.0)
MCH: 31.6 pg (ref 26.0–34.0)
MCHC: 33.8 g/dL (ref 30.0–36.0)
MCV: 93.4 fL (ref 80.0–100.0)
Platelets: 184 10*3/uL (ref 150–400)
RBC: 3.51 MIL/uL — ABNORMAL LOW (ref 3.87–5.11)
RDW: 12.7 % (ref 11.5–15.5)
WBC: 11.1 10*3/uL — ABNORMAL HIGH (ref 4.0–10.5)
nRBC: 0 % (ref 0.0–0.2)

## 2020-03-01 LAB — BASIC METABOLIC PANEL
Anion gap: 8 (ref 5–15)
BUN: 8 mg/dL (ref 8–23)
CO2: 26 mmol/L (ref 22–32)
Calcium: 8.9 mg/dL (ref 8.9–10.3)
Chloride: 99 mmol/L (ref 98–111)
Creatinine, Ser: 0.75 mg/dL (ref 0.44–1.00)
GFR calc Af Amer: >60 mL/min (ref >60)
GFR calc non Af Amer: >60 mL/min (ref >60)
Glucose, Bld: 136 mg/dL — ABNORMAL HIGH (ref 70–99)
Potassium: 4.7 mmol/L (ref 3.5–5.1)
Sodium: 133 mmol/L — ABNORMAL LOW (ref 135–145)

## 2020-03-01 MED ORDER — METHOCARBAMOL 500 MG PO TABS: 500.0000 mg | ORAL_TABLET | Freq: Four times a day (QID) | ORAL | 0 refills | Status: DC | PRN

## 2020-03-01 MED ORDER — ASPIRIN 81 MG PO TBEC: 81.0000 mg | DELAYED_RELEASE_TABLET | Freq: Every day | ORAL | 0 refills | Status: AC

## 2020-03-01 MED ORDER — OXYCODONE HCL 5 MG PO TABS
5.0000 mg | ORAL_TABLET | ORAL | Status: DC | PRN
  Administered 2020-03-01 – 2020-03-02 (×3): 10 mg via ORAL
  Filled 2020-03-01 (×4): qty 2

## 2020-03-01 MED ORDER — HYDROMORPHONE HCL 2 MG PO TABS: 2.0000 mg | ORAL_TABLET | Freq: Four times a day (QID) | ORAL | 0 refills | Status: DC | PRN

## 2020-03-01 NOTE — Plan of Care (Signed)
  Problem: Health Behavior/Discharge Planning: Goal: Ability to manage health-related needs will improve Outcome: Progressing   Problem: Clinical Measurements: Goal: Ability to maintain clinical measurements within normal limits will improve Outcome: Progressing   Problem: Clinical Measurements: Goal: Diagnostic test results will improve Outcome: Progressing   Problem: Clinical Measurements: Goal: Respiratory complications will improve Outcome: Progressing   Problem: Activity: Goal: Risk for activity intolerance will decrease Outcome: Progressing   Problem: Nutrition: Goal: Adequate nutrition will be maintained Outcome: Progressing

## 2020-03-01 NOTE — Progress Notes (Addendum)
° °  Subjective: 1 Day Post-Op Procedure(s) (LRB): TOTAL HIP ARTHROPLASTY ANTERIOR APPROACH (Left) Patient reports pain as moderate.   Patient seen in rounds with Dr. Wynelle Link. Patient is well, and has had no acute complaints or problems other than pain in the left hip. Pain under better control this morning, PO medication switched to dilaudid 2-4 mg Q4. Denies chest pain or SOB. Foley catheter to be removed this AM. We will continue therapy today.   Objective: Vital signs in last 24 hours: Temp:  [97.6 F (36.4 C)-99 F (37.2 C)] 99 F (37.2 C) (07/15 0503) Pulse Rate:  [64-82] 79 (07/15 0503) Resp:  [11-21] 14 (07/15 0503) BP: (123-165)/(64-84) 133/66 (07/15 0503) SpO2:  [93 %-100 %] 94 % (07/15 0503) Weight:  [74 kg] 74 kg (07/14 1319)  Intake/Output from previous day:  Intake/Output Summary (Last 24 hours) at 03/01/2020 0722 Last data filed at 03/01/2020 0600 Gross per 24 hour  Intake 4146.12 ml  Output 2850 ml  Net 1296.12 ml     Intake/Output this shift: No intake/output data recorded.  Labs: Recent Labs    03/01/20 0311  HGB 11.1*   Recent Labs    03/01/20 0311  WBC 11.1*  RBC 3.51*  HCT 32.8*  PLT 184   Recent Labs    03/01/20 0311  NA 133*  K 4.7  CL 99  CO2 26  BUN 8  CREATININE 0.75  GLUCOSE 136*  CALCIUM 8.9   No results for input(s): LABPT, INR in the last 72 hours.  Exam: General - Patient is Alert and Oriented Extremity - Neurologically intact Neurovascular intact Sensation intact distally Dorsiflexion/Plantar flexion intact Dressing - dressing C/D/I Motor Function - intact, moving foot and toes well on exam.   Past Medical History:  Diagnosis Date   Arthritis    Blood transfusion without reported diagnosis    CAD (coronary artery disease)    a. 1997 MI/PCI RCA;  b. 2007 MI/PCI of 100% RCA with Taxus DES x 3 placed, EF 55%;  c. 2010 Cath: stable anatomy;  d. 05/2012 NSTEMI/Cath/PCI: LM nl, LAD 60-24m, D1 20ost, LCX 31m, RCA  40-4m ISR, 60/95d (Treated w/ 2.75x33 Xience Xpedition DES), PDA 63m (Treated w/ PTCA), EF 60%.   Carpal tunnel syndrome on both sides    Chronic lower back pain    Depression    GERD (gastroesophageal reflux disease)    Hyperlipidemia    Myocardial infarction ([place]) 1997, 2007, 2013   Numbness of foot    Left foot - back surgery 2009    Assessment/Plan: 1 Day Post-Op Procedure(s) (LRB): TOTAL HIP ARTHROPLASTY ANTERIOR APPROACH (Left) Active Problems:   Primary osteoarthritis of left hip  Estimated body mass index is 28.9 kg/m as calculated from the following:   Height as of this encounter: 5\' 3"  (1.6 m).   Weight as of this encounter: 74 kg. Advance diet Up with therapy D/C IV fluids  DVT Prophylaxis - Aspirin and Plavix Weight bearing as tolerated. Continue therapy.  Plan is to go Home after hospital stay. Plan for discharge later today with HEP if progresses with therapy and is meeting her goals. Follow-up in the office July 27th.  The PDMP database was reviewed today (03/01/2020) prior to any opioid medications being prescribed to this patient.   Theresa Duty, PA-C Orthopedic Surgery [phone] 03/01/2020, 7:22 AM

## 2020-03-01 NOTE — Progress Notes (Addendum)
Physical Therapy Treatment Patient Details Name: Bianca [name] MRN: [id number] DOB: [birth year] Today's Date: 03/01/2020    History of Present Illness Patient is 74 y.o. female s/p Lt THA anterior approach on 02/29/20 with PMH significant for HLD, MI, GERD, CAD, OA, depression, Rt THA in 2015, lumbar surgery, CABG.    PT Comments    POD # 1 am session General Comments: impaited cognition, delayed response, forgetful, groggy (meds?) Assisted OOB.  General bed mobility comments: required increased assist and repeat VC's to complete task.  Attempted to teach her how to use a belt to self assist LE off bed.  General transfer comment: 262 VC's on proper hand placement and safety with turns.  Unsteady.  Drunk. Delayed.  Required increased time and repeat instruction.  Assisted on/off BSC.  HIGH FALL RISK  General Gait Details: VERY unsteady drunken gait with delayed motor mvmts and impaired cognition.  100% VC's on proper walker to self distance and repeat VC's to complete task.  HIGH FALL RISK.  Pt will need a second PT session.  Has yet to amb in hallway and she needs to be able to navigate stairs for a safe D/C.    Spouse stated pt reacted same way with her first hip surgery when she took Dilaudid.     Follow Up Recommendations  Follow surgeon's recommendation for DC plan and follow-up therapies     Equipment Recommendations  3in1 (PT)    Recommendations for Other Services       Precautions / Restrictions Precautions Precautions: Fall Restrictions Other Position/Activity Restrictions: WBAT    Mobility  Bed Mobility Overal bed mobility: Needs Assistance Bed Mobility: Supine to Sit     Supine to sit: Mod assist     General bed mobility comments: required increased assist and repeat VC's to complete task.  Attempted to teach her how to use a belt to self assist LE off bed  Transfers Overall transfer level: Needs assistance Equipment used: Rolling walker (2 wheeled) Transfers:  Sit to/from [facility] Sit to Stand: Min assist Stand pivot transfers: Min assist;Mod assist       General transfer comment: 035 VC's on proper hand placement and safety with turns.  Unsteady.  Drunk. Delayed.  Required increased time and repeat instruction.  Assisted on/off BSC.  HIGH FALL RISK  Ambulation/Gait Ambulation/Gait assistance: Min assist;Mod assist Gait Distance (Feet): 8 Feet Assistive device: Rolling walker (2 wheeled) Gait Pattern/deviations: Step-to pattern;Decreased stride length;Decreased stance time - left;Decreased weight shift to left;Antalgic Gait velocity: decreased   General Gait Details: VERY unsteady drunken gait with delayed motor mvmts and impaired cognition.  100% VC's on proper walker to self distance and repeat VC's to complete task.  HIGH FALL RISK.   Stairs             Wheelchair Mobility    Modified Rankin (Stroke Patients Only)       Balance                                            Cognition Arousal/Alertness: Awake/alert                                     General Comments: impaited cognition, delayed response, forgetful, groggy (meds?)      Exercises  General Comments        Pertinent Vitals/Pain Pain Assessment: 0-10 Pain Score: 6  Pain Location: Lt hip Pain Descriptors / Indicators: Aching;Discomfort;Grimacing;Operative site guarding Pain Intervention(s): Monitored during session;Premedicated before session;Repositioned;Ice applied    Home Living                      Prior Function            PT Goals (current goals can now be found in the care plan section) Progress towards PT goals: Progressing toward goals    Frequency    7X/week      PT Plan Current plan remains appropriate    Co-evaluation              AM-PAC PT "6 Clicks" Mobility   Outcome Measure  Help needed turning from your back to your side while in a flat bed  without using bedrails?: A Lot Help needed moving from lying on your back to sitting on the side of a flat bed without using bedrails?: A Lot Help needed moving to and from a bed to a chair (including a wheelchair)?: A Lot Help needed standing up from a chair using your arms (e.g., wheelchair or bedside chair)?: A Lot Help needed to walk in hospital room?: A Lot Help needed climbing 3-5 steps with a railing? : Total 6 Click Score: 11    End of Session Equipment Utilized During Treatment: Gait belt Activity Tolerance: Treatment limited secondary to medical complications (Comment) Patient left: in chair;with call bell/phone within reach;with chair alarm set;with family/visitor present Nurse Communication: Mobility status PT Visit Diagnosis: Muscle weakness (generalized) (M62.81);Difficulty in walking, not elsewhere classified (R26.2)     Time: 1130-1155 PT Time Calculation (min) (ACUTE ONLY): 25 min  Charges:  $Gait Training: 8-22 mins $Therapeutic Activity: 8-22 mins                     Rica Koyanagi  PTA Acute  Rehabilitation Services Pager      [phone] Office      [phone]

## 2020-03-01 NOTE — Progress Notes (Signed)
Physical Therapy Treatment Patient Details Name: Bianca [name] MRN: [id number] DOB: June 13, 1946 Today's Date: 03/01/2020    History of Present Illness Patient is 74 y.o. female s/p Lt THA anterior approach on 02/29/20 with PMH significant for HLD, MI, GERD, CAD, OA, depression, Rt THA in 2015, lumbar surgery, CABG.    PT Comments    POD # 1 pm session Pt in recliner sleeping but easily arouse.  Required increased time to recall what she just ate for lunch.  Assisted out of recliner to amb to bathroom.  General transfer comment: 50% VC's on proper hand placement and safety with turns.  Unsteady.  Drunk. Delayed.  Required increased time and repeat instruction.  Slight improvement from this morning.  Still HIGH FALL RISK.Marland Kitchen General Gait Details: Assisted with amb to bathroom required increased time and 75% VC's on proper walker to self distance and esp safety with turns.  Pt had x 1 LOB in bathroom when she turned to reach sink.  Therapist recovered.Assisted back to bed as pt was too fatigued to attempt further activity.  Pt has NOT met goals to safely D/C to today.    Follow Up Recommendations  Follow surgeon's recommendation for DC plan and follow-up therapies     Equipment Recommendations  3in1 (PT)    Recommendations for Other Services       Precautions / Restrictions Precautions Precautions: Fall Restrictions Other Position/Activity Restrictions: WBAT    Mobility  Bed Mobility Overal bed mobility: Needs Assistance Bed Mobility: Sit to Supine     Supine to sit: Mod assist Sit to supine: Min assist;Mod assist   General bed mobility comments: assisted back to bed with increased effort  Transfers Overall transfer level: Needs assistance Equipment used: Rolling walker (2 wheeled) Transfers: Sit to/from [facility] Sit to Stand: Min assist Stand pivot transfers: Min assist;Mod assist       General transfer comment: 50% VC's on proper hand placement and  safety with turns.  Unsteady.  Drunk. Delayed.  Required increased time and repeat instruction.  Slight improvement from this morning.  Still HIGH FALL RISK.  Ambulation/Gait Ambulation/Gait assistance: Min assist;Mod assist Gait Distance (Feet): 18 Feet Assistive device: Rolling walker (2 wheeled) Gait Pattern/deviations: Step-to pattern;Decreased stride length;Decreased stance time - left;Decreased weight shift to left;Antalgic Gait velocity: decreased   General Gait Details: Assisted with amb to bathroom required increased time and 75% VC's on proper walker to self distance and esp safety with turns.  Pt had x 1 LOB in bathroom when she turned to reach sink.  Therapist recovered.   Stairs             Wheelchair Mobility    Modified Rankin (Stroke Patients Only)       Balance                                            Cognition Arousal/Alertness: Awake/alert                                     General Comments: impaited cognition, delayed response, forgetful, groggy (meds?)      Exercises      General Comments        Pertinent Vitals/Pain Pain Assessment: 0-10 Pain Score: 6  Pain Location: Lt hip Pain Descriptors /  Indicators: Aching;Discomfort;Grimacing;Operative site guarding Pain Intervention(s): Monitored during session;Premedicated before session;Repositioned;Ice applied    Home Living                      Prior Function            PT Goals (current goals can now be found in the care plan section) Progress towards PT goals: Progressing toward goals    Frequency    7X/week      PT Plan Current plan remains appropriate    Co-evaluation              AM-PAC PT "6 Clicks" Mobility   Outcome Measure  Help needed turning from your back to your side while in a flat bed without using bedrails?: A Lot Help needed moving from lying on your back to sitting on the side of a flat bed without using  bedrails?: A Lot Help needed moving to and from a bed to a chair (including a wheelchair)?: A Lot Help needed standing up from a chair using your arms (e.g., wheelchair or bedside chair)?: A Lot Help needed to walk in hospital room?: A Lot Help needed climbing 3-5 steps with a railing? : Total 6 Click Score: 11    End of Session Equipment Utilized During Treatment: Gait belt Activity Tolerance: Treatment limited secondary to medical complications (Comment) Patient left: in bed Nurse Communication: Mobility status PT Visit Diagnosis: Muscle weakness (generalized) (M62.81);Difficulty in walking, not elsewhere classified (R26.2)     Time: 1414-1440 PT Time Calculation (min) (ACUTE ONLY): 26 min  Charges:  $Gait Training: 8-22 mins $Therapeutic Activity: 8-22 mins                     Rica Koyanagi  PTA Acute  Rehabilitation Services Pager      [phone] Office      [phone]

## 2020-03-01 NOTE — TOC Transition Note (Signed)
Transition of Care [facility]) - CM/SW Discharge Note   Patient Details  Name: Bianca [name] MRN: [id number] Date of Birth: Feb 01, 1946  [facility] CM/SW Contact:  Lennart Pall, LCSW Phone Number: 03/01/2020, 2:52 PM   Clinical Narrative:   Met briefly with pt and spouse who confirm they have all needed DME and plan for HEP at d/c.  No identified TOC needs.    Final next level of care: Home/Self Care Barriers to Discharge: No Barriers Identified   Patient Goals and CMS Choice Patient states their goals for this hospitalization and ongoing recovery are:: go home   Choice offered to / list presented to : NA  Discharge Placement                       Discharge Plan and Services                DME Arranged: N/A DME Agency: NA       HH Arranged: NA HH Agency: NA        Social Determinants of Health (SDOH) Interventions     Readmission Risk Interventions No flowsheet data found.

## 2020-03-02 LAB — CBC
HCT: 29.4 % — ABNORMAL LOW (ref 36.0–46.0)
Hemoglobin: 10 g/dL — ABNORMAL LOW (ref 12.0–15.0)
MCH: 32.2 pg (ref 26.0–34.0)
MCHC: 34 g/dL (ref 30.0–36.0)
MCV: 94.5 fL (ref 80.0–100.0)
Platelets: 178 10*3/uL (ref 150–400)
RBC: 3.11 MIL/uL — ABNORMAL LOW (ref 3.87–5.11)
RDW: 13.1 % (ref 11.5–15.5)
WBC: 10.7 10*3/uL — ABNORMAL HIGH (ref 4.0–10.5)
nRBC: 0 % (ref 0.0–0.2)

## 2020-03-02 LAB — BASIC METABOLIC PANEL
Anion gap: 9 (ref 5–15)
BUN: 11 mg/dL (ref 8–23)
CO2: 28 mmol/L (ref 22–32)
Calcium: 9.1 mg/dL (ref 8.9–10.3)
Chloride: 97 mmol/L — ABNORMAL LOW (ref 98–111)
Creatinine, Ser: 0.68 mg/dL (ref 0.44–1.00)
GFR calc Af Amer: >60 mL/min (ref >60)
GFR calc non Af Amer: >60 mL/min (ref >60)
Glucose, Bld: 128 mg/dL — ABNORMAL HIGH (ref 70–99)
Potassium: 4.8 mmol/L (ref 3.5–5.1)
Sodium: 134 mmol/L — ABNORMAL LOW (ref 135–145)

## 2020-03-02 NOTE — Progress Notes (Addendum)
Patient discharged to home w/ husband. Given all belongings, instructions, equipment. Verbalized understanding of instructions. Verbalized understanding that regular pain specialist MD will manage home pain medication prescriptions.  Escorted to pov via w/c.

## 2020-03-02 NOTE — Progress Notes (Signed)
Physical Therapy Treatment Patient Details Name: Bianca [name] MRN: [id number] DOB: [birth year] Today's Date: 03/02/2020    History of Present Illness Patient is 74 y.o. female s/p Lt THA anterior approach on 02/29/20 with PMH significant for HLD, MI, GERD, CAD, OA, depression, Rt THA in 2015, lumbar surgery, CABG.    PT Comments    Pt is progressing today. Continues to require multi-modal, repetitious cues for mobility. Reviewed stairs and HEP; pt husband present for PT session and states he feels comfortable assisting, he is cuing pt during session today as she demonstrates decr safety awareness and decr recall.  Pt would benefit from HHPT.  Recommend assist with all mobility at home to prevent falls.   Follow Up Recommendations  Follow surgeon's recommendation for DC plan and follow-up therapies     Equipment Recommendations  3in1 (PT)    Recommendations for Other Services       Precautions / Restrictions Precautions Precautions: Fall Restrictions Weight Bearing Restrictions: No Other Position/Activity Restrictions: WBAT    Mobility  Bed Mobility Overal bed mobility: Needs Assistance Bed Mobility: Sit to Supine     Supine to sit: Min guard     General bed mobility comments: cues for technique and to self assist with leg lifter (gait belt)  Transfers Overall transfer level: Needs assistance Equipment used: Rolling walker (2 wheeled) Transfers: Sit to/from Stand Sit to Stand: Min assist         General transfer comment: cues for hand placement and LLE positioning   Ambulation/Gait Ambulation/Gait assistance: Min assist Gait Distance (Feet): 60 Feet Assistive device: Rolling walker (2 wheeled) Gait Pattern/deviations: Step-to pattern;Decreased stance time - left;Antalgic Gait velocity: decreased   General Gait Details: multi-modal cues for sequence and RW position   Stairs Stairs: Yes Stairs assistance: Min assist;Mod assist Stair Management: Two  rails;One rail Left;One rail Right;Step to pattern;Sideways;Forwards Number of Stairs: 5 (x2) General stair comments: up/down with bil rails and one rail, min assist overall, brief mod assist coming down with one rail d/t fatigue and pain. multi-modal repetitous cues for  sequence and technique, husband present for stair training    Wheelchair Mobility    Modified Rankin (Stroke Patients Only)       Balance           Standing balance support: During functional activity;Bilateral upper extremity supported Standing balance-Leahy Scale: Poor (reliant on UEs )                              Cognition Arousal/Alertness: Awake/alert Behavior During Therapy: WFL for tasks assessed/performed Overall Cognitive Status: Impaired/Different from baseline Area of Impairment: Memory;Attention;Safety/judgement;Problem solving;Following commands                   Current Attention Level: Sustained Memory: Decreased short-term memory Following Commands: Follows one step commands with increased time;Follows multi-step commands inconsistently Safety/Judgement: Decreased awareness of safety   Problem Solving: Slow processing;Decreased initiation;Difficulty sequencing;Requires verbal cues;Requires tactile cues General Comments: husband present for session, cuing pt for mobility tasks       Exercises      General Comments        Pertinent Vitals/Pain Pain Assessment: 0-10 Pain Score: 5  Pain Location: Lt hip Pain Descriptors / Indicators: Aching;Discomfort;Grimacing;Operative site guarding Pain Intervention(s): Limited activity within patient's tolerance;Monitored during session;Repositioned;Premedicated before session;Ice applied    Home Living  Prior Function            PT Goals (current goals can now be found in the care plan section) Acute Rehab PT Goals Patient Stated Goal: get back to independence PT Goal Formulation: With  patient Time For Goal Achievement: 03/07/20 Potential to Achieve Goals: Good Progress towards PT goals: Progressing toward goals    Frequency    7X/week      PT Plan Current plan remains appropriate    Co-evaluation              AM-PAC PT "6 Clicks" Mobility   Outcome Measure  Help needed turning from your back to your side while in a flat bed without using bedrails?: A Lot Help needed moving from lying on your back to sitting on the side of a flat bed without using bedrails?: A Little Help needed moving to and from a bed to a chair (including a wheelchair)?: A Little Help needed standing up from a chair using your arms (e.g., wheelchair or bedside chair)?: A Little Help needed to walk in hospital room?: A Little Help needed climbing 3-5 steps with a railing? : A Lot 6 Click Score: 16    End of Session Equipment Utilized During Treatment: Gait belt Activity Tolerance: Patient tolerated treatment well;Patient limited by fatigue Patient left: in bed;in CPM;with bed alarm set;with family/visitor present   PT Visit Diagnosis: Muscle weakness (generalized) (M62.81);Difficulty in walking, not elsewhere classified (R26.2)     Time: 1125-1206 PT Time Calculation (min) (ACUTE ONLY): 41 min  Charges:  $Gait Training: 23-37 mins $Therapeutic Activity: 8-22 mins                     Baxter Flattery, PT  Acute Rehab Dept ([place]) [phone] Pager [phone]  03/02/2020    [facility] 03/02/2020, 12:18 PM

## 2020-03-02 NOTE — Care Management Important Message (Signed)
Important Message  Patient Details IM Letter presented to the Patient Name: Bianca [name] MRN: [id number] Date of Birth: [birth year]   Medicare Important Message Given:  Yes     Kerin Salen 03/02/2020, 2:55 PM

## 2020-03-02 NOTE — Progress Notes (Signed)
Subjective: 2 Days Post-Op Procedure(s) (LRB): TOTAL HIP ARTHROPLASTY ANTERIOR APPROACH (Left) Patient reports pain as mild.   Patient seen in rounds for Dr. Wynelle Link. Patient is well, and has had no acute complaints or problems other than discomfort in the left hip. No acute events overnight. She was limited in PT yesterday due to drowsiness, which improved when she was switched back to Oxycodone. Per nurse, she was comfortable all night and required minimal pain medications overnight. Patient is up in the recliner on exam today. Voiding without difficulty, positive flatus. We will continue therapy today.   Objective: Vital signs in last 24 hours: Temp:  [98 F (36.7 C)-99.1 F (37.3 C)] 98 F (36.7 C) (07/16 0528) Pulse Rate:  [64-80] 64 (07/16 0528) Resp:  [15-18] 16 (07/16 0528) BP: (112-130)/(62-69) 128/69 (07/16 0528) SpO2:  [91 %-98 %] 98 % (07/16 0528)  Intake/Output from previous day:  Intake/Output Summary (Last 24 hours) at 03/02/2020 0808 Last data filed at 03/02/2020 0600 Gross per 24 hour  Intake 1140 ml  Output 500 ml  Net 640 ml     Intake/Output this shift: No intake/output data recorded.  Labs: Recent Labs    03/01/20 0311 03/02/20 0323  HGB 11.1* 10.0*   Recent Labs    03/01/20 0311 03/02/20 0323  WBC 11.1* 10.7*  RBC 3.51* 3.11*  HCT 32.8* 29.4*  PLT 184 178   Recent Labs    03/01/20 0311 03/02/20 0323  NA 133* 134*  K 4.7 4.8  CL 99 97*  CO2 26 28  BUN 8 11  CREATININE 0.75 0.68  GLUCOSE 136* 128*  CALCIUM 8.9 9.1   No results for input(s): LABPT, INR in the last 72 hours.  Exam: General - Patient is Alert and Oriented Extremity - Neurologically intact Sensation intact distally Intact pulses distally Dorsiflexion/Plantar flexion intact Dressing - dressing C/D/I Motor Function - intact, moving foot and toes well on exam.   Past Medical History:  Diagnosis Date  . Arthritis   . Blood transfusion without reported diagnosis    . CAD (coronary artery disease)    a. 1997 MI/PCI RCA;  b. 2007 MI/PCI of 100% RCA with Taxus DES x 3 placed, EF 55%;  c. 2010 Cath: stable anatomy;  d. 05/2012 NSTEMI/Cath/PCI: LM nl, LAD 60-4m, D1 20ost, LCX 92m, RCA 40-24m ISR, 60/95d (Treated w/ 2.75x33 Xience Xpedition DES), PDA 98m (Treated w/ PTCA), EF 60%.  . Carpal tunnel syndrome on both sides   . Chronic lower back pain   . Depression   . GERD (gastroesophageal reflux disease)   . Hyperlipidemia   . Myocardial infarction [facility]) 1997, 2007, 2013  . Numbness of foot    Left foot - back surgery 2009    Assessment/Plan: 2 Days Post-Op Procedure(s) (LRB): TOTAL HIP ARTHROPLASTY ANTERIOR APPROACH (Left) Active Problems:   Primary osteoarthritis of left hip  Estimated body mass index is 28.9 kg/m as calculated from the following:   Height as of this encounter: 5\' 3"  (1.6 m).   Weight as of this encounter: 74 kg. Advance diet Up with therapy D/C IV fluids  DVT Prophylaxis - Aspirin Weight bearing as tolerated.  Plan is to go Home after hospital stay. Plan for likely discharge home today following 1-2 sessions of therapy as long as she is meeting her goals. Follow up in the office in 2 weeks.   She is in pain management with Dr. Nelva Bush, and had a refill of Norco 10 #120 on 02/24/20 per  PDMP. At this point, we will not prescribe additional pain medication. She will call our office with any concerns.   Griffith Citron, PA-C Orthopedic Surgery [phone] 03/02/2020, 8:08 AM

## 2020-03-06 NOTE — Discharge Summary (Signed)
Physician Discharge Summary   Patient ID: Bianca [name] MRN: [id number] DOB/AGE: 1946-08-04 74 y.o.  Admit date: 02/29/2020 Discharge date: 03/02/2020  Primary Diagnosis: Osteoarthritis of the Left  hip.   Admission Diagnoses:  Past Medical History:  Diagnosis Date  . Arthritis   . Blood transfusion without reported diagnosis   . CAD (coronary artery disease)    a. 1997 MI/PCI RCA;  b. 2007 MI/PCI of 100% RCA with Taxus DES x 3 placed, EF 55%;  c. 2010 Cath: stable anatomy;  d. 05/2012 NSTEMI/Cath/PCI: LM nl, LAD 60-81m, D1 20ost, LCX 76m, RCA 40-29m ISR, 60/95d (Treated w/ 2.75x33 Xience Xpedition DES), PDA 9m (Treated w/ PTCA), EF 60%.  . Carpal tunnel syndrome on both sides   . Chronic lower back pain   . Depression   . GERD (gastroesophageal reflux disease)   . Hyperlipidemia   . Myocardial infarction [facility]) 1997, 2007, 2013  . Numbness of foot    Left foot - back surgery 2009   Discharge Diagnoses:   Active Problems:   Primary osteoarthritis of left hip  Estimated body mass index is 28.9 kg/m as calculated from the following:   Height as of this encounter: 5\' 3"  (1.6 m).   Weight as of this encounter: 74 kg.  Procedure:  Procedure(s) (LRB): TOTAL HIP ARTHROPLASTY ANTERIOR APPROACH (Left)   Consults: None  HPI:  Bianca [name] is a 74 y.o. female who has advanced end- stage arthritis of their Left  hip with progressively worsening pain and dysfunction.The patient has failed nonoperative management and presents for total hip arthroplasty.   Laboratory Data: Admission on 02/29/2020, Discharged on 03/02/2020  Component Date Value Ref Range Status  . WBC 03/01/2020 11.1* 4.0 - 10.5 K/uL Final  . RBC 03/01/2020 3.51* 3.87 - 5.11 MIL/uL Final  . Hemoglobin 03/01/2020 11.1* 12.0 - 15.0 g/dL Final  . HCT 03/01/2020 32.8* 36 - 46 % Final  . MCV 03/01/2020 93.4  80.0 - 100.0 fL Final  . MCH 03/01/2020 31.6  26.0 - 34.0 pg Final  . MCHC 03/01/2020 33.8  30.0 - 36.0 g/dL  Final  . RDW 03/01/2020 12.7  11.5 - 15.5 % Final  . Platelets 03/01/2020 184  150 - 400 K/uL Final  . nRBC 03/01/2020 0.0  0.0 - 0.2 % Final   Performed at [facility], [place] [address] [place], [place] [address]  . Sodium 03/01/2020 133* 135 - 145 mmol/L Final  . Potassium 03/01/2020 4.7  3.5 - 5.1 mmol/L Final  . Chloride 03/01/2020 99  98 - 111 mmol/L Final  . CO2 03/01/2020 26  22 - 32 mmol/L Final  . Glucose, Bld 03/01/2020 136* 70 - 99 mg/dL Final   Glucose reference range applies only to samples taken after fasting for at least 8 hours.  . BUN 03/01/2020 8  8 - 23 mg/dL Final  . Creatinine, Ser 03/01/2020 0.75  0.44 - 1.00 mg/dL Final  . Calcium 03/01/2020 8.9  8.9 - 10.3 mg/dL Final  . GFR calc non Af Amer 03/01/2020 >60  >60 mL/min Final  . GFR calc Af Amer 03/01/2020 >60  >60 mL/min Final  . Anion gap 03/01/2020 8  5 - 15 Final   Performed at [facility], [place] [address], [place], [place] [address]  . WBC 03/02/2020 10.7* 4.0 - 10.5 K/uL Final  . RBC 03/02/2020 3.11* 3.87 - 5.11 MIL/uL Final  . Hemoglobin 03/02/2020 10.0* 12.0 - 15.0 g/dL Final  .  HCT 03/02/2020 29.4* 36 - 46 % Final  . MCV 03/02/2020 94.5  80.0 - 100.0 fL Final  . MCH 03/02/2020 32.2  26.0 - 34.0 pg Final  . MCHC 03/02/2020 34.0  30.0 - 36.0 g/dL Final  . RDW 03/02/2020 13.1  11.5 - 15.5 % Final  . Platelets 03/02/2020 178  150 - 400 K/uL Final  . nRBC 03/02/2020 0.0  0.0 - 0.2 % Final   Performed at [facility], [place] [address] [place], [place] [address]  . Sodium 03/02/2020 134* 135 - 145 mmol/L Final  . Potassium 03/02/2020 4.8  3.5 - 5.1 mmol/L Final  . Chloride 03/02/2020 97* 98 - 111 mmol/L Final  . CO2 03/02/2020 28  22 - 32 mmol/L Final  . Glucose, Bld 03/02/2020 128* 70 - 99 mg/dL Final   Glucose reference range applies only to samples taken after fasting for at least 8 hours.  . BUN 03/02/2020 11  8 - 23 mg/dL Final  . Creatinine, Ser  03/02/2020 0.68  0.44 - 1.00 mg/dL Final  . Calcium 03/02/2020 9.1  8.9 - 10.3 mg/dL Final  . GFR calc non Af Amer 03/02/2020 >60  >60 mL/min Final  . GFR calc Af Amer 03/02/2020 >60  >60 mL/min Final  . Anion gap 03/02/2020 9  5 - 15 Final   Performed at [facility], [place] [address] [place], [place] [address]  Hospital Outpatient Visit on 02/27/2020  Component Date Value Ref Range Status  . SARS Coronavirus 2 02/27/2020 NEGATIVE  NEGATIVE Final   Comment: (NOTE) SARS-CoV-2 target nucleic acids are NOT DETECTED.  The SARS-CoV-2 RNA is generally detectable in upper and lower respiratory specimens during the acute phase of infection. Negative results do not preclude SARS-CoV-2 infection, do not rule out co-infections with other pathogens, and should not be used as the sole basis for treatment or other patient management decisions. Negative results must be combined with clinical observations, patient history, and epidemiological information. The expected result is Negative.  Fact Sheet for Patients: SugarRoll.be  Fact Sheet for Healthcare Providers: https://www.woods-mathews.com/  This test is not yet approved or cleared by the [place] FDA and  has been authorized for detection and/or diagnosis of SARS-CoV-2 by FDA under an Emergency Use Authorization (EUA). This EUA will remain  in effect (meaning this test can be used) for the duration of the COVID-19 declaration under Se                          ction 564(b)(1) of the Act, 21 U.S.C. section 360bbb-3(b)(1), unless the authorization is terminated or revoked sooner.  Performed at [facility], [place] [address], [place], [place] [address]   Hospital Outpatient Visit on 02/21/2020  Component Date Value Ref Range Status  . aPTT 02/21/2020 29  24 - 36 seconds Final   Performed at [facility], [place] [address], [place], [place] [address]  . WBC  02/21/2020 6.7  4.0 - 10.5 K/uL Final  . RBC 02/21/2020 4.46  3.87 - 5.11 MIL/uL Final  . Hemoglobin 02/21/2020 13.9  12.0 - 15.0 g/dL Final  . HCT 02/21/2020 41.2  36 - 46 % Final  . MCV 02/21/2020 92.4  80.0 - 100.0 fL Final  . MCH 02/21/2020 31.2  26.0 - 34.0 pg Final  . MCHC 02/21/2020 33.7  30.0 - 36.0 g/dL Final  . RDW 02/21/2020 12.3  11.5 - 15.5 % Final  .  Platelets 02/21/2020 215  150 - 400 K/uL Final  . nRBC 02/21/2020 0.0  0.0 - 0.2 % Final   Performed at [facility], [place] [address], [place], [place] [address]  . Sodium 02/21/2020 132* 135 - 145 mmol/L Final  . Potassium 02/21/2020 4.3  3.5 - 5.1 mmol/L Final  . Chloride 02/21/2020 98  98 - 111 mmol/L Final  . CO2 02/21/2020 26  22 - 32 mmol/L Final  . Glucose, Bld 02/21/2020 106* 70 - 99 mg/dL Final   Glucose reference range applies only to samples taken after fasting for at least 8 hours.  . BUN 02/21/2020 6* 8 - 23 mg/dL Final  . Creatinine, Ser 02/21/2020 0.80  0.44 - 1.00 mg/dL Final  . Calcium 02/21/2020 9.6  8.9 - 10.3 mg/dL Final  . Total Protein 02/21/2020 7.1  6.5 - 8.1 g/dL Final  . Albumin 02/21/2020 4.4  3.5 - 5.0 g/dL Final  . AST 02/21/2020 24  15 - 41 U/L Final  . ALT 02/21/2020 16  0 - 44 U/L Final  . Alkaline Phosphatase 02/21/2020 89  38 - 126 U/L Final  . Total Bilirubin 02/21/2020 1.0  0.3 - 1.2 mg/dL Final  . GFR calc non Af Amer 02/21/2020 >60  >60 mL/min Final  . GFR calc Af Amer 02/21/2020 >60  >60 mL/min Final  . Anion gap 02/21/2020 8  5 - 15 Final   Performed at [facility], [place] [address], [place], [place] [address]  . Prothrombin Time 02/21/2020 12.5  11.4 - 15.2 seconds Final  . INR 02/21/2020 1.0  0.8 - 1.2 Final   Comment: (NOTE) INR goal varies based on device and disease states. Performed at [facility], [place] [address] [place], [place] [address]   . ABO/RH(D) 02/21/2020 O POS   Final  . Antibody Screen 02/21/2020 NEG    Final  . Sample Expiration 02/21/2020 03/03/2020,2359   Final  . Extend sample reason 02/21/2020    Final                   Value:NO TRANSFUSIONS OR PREGNANCY IN THE PAST 3 MONTHS Performed at [facility], [place] [address] [place], [place] [address]   . MRSA, PCR 02/21/2020 NEGATIVE  NEGATIVE Final  . Staphylococcus aureus 02/21/2020 NEGATIVE  NEGATIVE Final   Comment: (NOTE) The Xpert SA Assay (FDA approved for NASAL specimens in patients 49 years of age and older), is one component of a comprehensive surveillance program. It is not intended to diagnose infection nor to guide or monitor treatment. Performed at [facility], [place] [address], [place], [place] [address]   Office Visit on 02/15/2020  Component Date Value Ref Range Status  . Cholesterol, Total 02/15/2020 169  100 - 199 mg/dL Final  . Triglycerides 02/15/2020 249* 0 - 149 mg/dL Final  . HDL 02/15/2020 49  >39 mg/dL Final  . VLDL Cholesterol Cal 02/15/2020 41* 5 - 40 mg/dL Final  . LDL Chol Calc (NIH) 02/15/2020 79  0 - 99 mg/dL Final  . Chol/HDL Ratio 02/15/2020 3.4  0.0 - 4.4 ratio Final   Comment:                                   T. Chol/HDL Ratio  Men  Women                               1/2 Avg.Risk  3.4    3.3                                   Avg.Risk  5.0    4.4                                2X Avg.Risk  9.6    7.1                                3X Avg.Risk 23.4   11.0   Appointment on 02/02/2020  Component Date Value Ref Range Status  . Hgb A1c MFr Bld 02/02/2020 5.6  4.6 - 6.5 % Final   Glycemic Control Guidelines for People with Diabetes:Non Diabetic:  <6%Goal of Therapy: <7%Additional Action Suggested:  >8%   . Sodium 02/02/2020 131* 135 - 145 mEq/L Final  . Potassium 02/02/2020 3.4* 3.5 - 5.1 mEq/L Final  . Chloride 02/02/2020 96  96 - 112 mEq/L Final  . CO2 02/02/2020 27  19 - 32 mEq/L Final  . Glucose, Bld 02/02/2020 105*  70 - 99 mg/dL Final  . BUN 02/02/2020 7  6 - 23 mg/dL Final  . Creatinine, Ser 02/02/2020 0.91  0.40 - 1.20 mg/dL Final  . Total Bilirubin 02/02/2020 0.4  0.2 - 1.2 mg/dL Final  . Alkaline Phosphatase 02/02/2020 85  39 - 117 U/L Final  . AST 02/02/2020 16  0 - 37 U/L Final  . ALT 02/02/2020 10  0 - 35 U/L Final  . Total Protein 02/02/2020 6.6  6.0 - 8.3 g/dL Final  . Albumin 02/02/2020 4.1  3.5 - 5.2 g/dL Final  . GFR 02/02/2020 60.41  >60.00 mL/min Final  . Calcium 02/02/2020 9.4  8.4 - 10.5 mg/dL Final  . WBC 02/02/2020 7.4  4.0 - 10.5 K/uL Final  . RBC 02/02/2020 4.19  3.87 - 5.11 Mil/uL Final  . Hemoglobin 02/02/2020 13.2  12.0 - 15.0 g/dL Final  . HCT 02/02/2020 38.8  36 - 46 % Final  . MCV 02/02/2020 92.4  78.0 - 100.0 fl Final  . MCHC 02/02/2020 34.0  30.0 - 36.0 g/dL Final  . RDW 02/02/2020 13.0  11.5 - 15.5 % Final  . Platelets 02/02/2020 210.0  150 - 400 K/uL Final  . Neutrophils Relative % 02/02/2020 39.8* 43 - 77 % Final  . Lymphocytes Relative 02/02/2020 48.7* 12 - 46 % Final  . Monocytes Relative 02/02/2020 9.0  3 - 12 % Final  . Eosinophils Relative 02/02/2020 1.8  0 - 5 % Final  . Basophils Relative 02/02/2020 0.7  0 - 3 % Final  . Neutro Abs 02/02/2020 3.0  1.4 - 7.7 K/uL Final  . Lymphs Abs 02/02/2020 3.6  0.7 - 4.0 K/uL Final  . Monocytes Absolute 02/02/2020 0.7  0 - 1 K/uL Final  . Eosinophils Absolute 02/02/2020 0.1  0 - 0 K/uL Final  . Basophils Absolute 02/02/2020 0.1  0 - 0 K/uL Final     X-Rays:DG Pelvis Portable  Result Date: 02/29/2020 CLINICAL DATA:  Post left hip replacement EXAM: PORTABLE PELVIS 1-2 VIEWS  COMPARISON:  None. FINDINGS: Bilateral hip replacements, new on the left and remote on the right. Normal alignment. No hardware bony complicating feature. Soft tissue gas noted overlying the left hip. IMPRESSION: New left hip replacement.  No complicating feature. Electronically Signed   By: Rolm Baptise M.D.   On: 02/29/2020 10:43   DG C-Arm 1-60  Min-No Report  Result Date: 02/29/2020 CLINICAL DATA:  Anterior left total hip arthroplasty EXAM: OPERATIVE LEFT HIP WITH PELVIS; DG C-ARM 1-60 MIN-NO REPORT COMPARISON:  None. FLUOROSCOPY TIME:  Fluoroscopy Time: 0 minutes 7 seconds Number of Acquired Spot Images: 4 FINDINGS: Multiple nondiagnostic spot fluoroscopic left hip radiographs demonstrate expected postsurgical changes from left total hip arthroplasty with no evidence of left hip dislocation on these frontal views. IMPRESSION: Intraoperative fluoroscopic guidance for left total hip arthroplasty. Electronically Signed   By: Ilona Sorrel M.D.   On: 02/29/2020 10:17   DG HIP OPERATIVE UNILAT W OR W/O PELVIS LEFT  Result Date: 02/29/2020 CLINICAL DATA:  Anterior left total hip arthroplasty EXAM: OPERATIVE LEFT HIP WITH PELVIS; DG C-ARM 1-60 MIN-NO REPORT COMPARISON:  None. FLUOROSCOPY TIME:  Fluoroscopy Time: 0 minutes 7 seconds Number of Acquired Spot Images: 4 FINDINGS: Multiple nondiagnostic spot fluoroscopic left hip radiographs demonstrate expected postsurgical changes from left total hip arthroplasty with no evidence of left hip dislocation on these frontal views. IMPRESSION: Intraoperative fluoroscopic guidance for left total hip arthroplasty. Electronically Signed   By: Ilona Sorrel M.D.   On: 02/29/2020 10:17    EKG: Orders placed or performed in visit on 01/26/20  . EKG 12-Lead     Hospital Course: Bianca [name] is a 74 y.o. who was admitted to [facility]. They were brought to the operating room on 02/29/2020 and underwent Procedure(s): [place].  Patient tolerated the procedure well and was later transferred to the recovery room and then to the orthopaedic floor for postoperative care. They were given PO and IV analgesics for pain control following their surgery. They were given 24 hours of postoperative antibiotics of  Anti-infectives (From admission, onward)   Start     Dose/Rate Route  Frequency Ordered Stop   02/29/20 1400  ceFAZolin (ANCEF) IVPB 2g/100 mL premix        2 g 200 mL/hr over 30 Minutes Intravenous Every 6 hours 02/29/20 1239 02/29/20 2038   02/29/20 0645  ceFAZolin (ANCEF) IVPB 2g/100 mL premix        2 g 200 mL/hr over 30 Minutes Intravenous On call to O.R. 02/29/20 7017 02/29/20 0835     and started on DVT prophylaxis in the form of Aspirin and Plavix.   PT and OT were ordered for total joint protocol. Discharge planning consulted to help with postop disposition and equipment needs. Patient had a fair night on the evening of surgery. She had difficulty with pain management, and her PO meds were adjusted which improved this. They started to get up OOB with therapy on POD #0. Continued to work with therapy into POD #1. She progressed slowly with PT due to drowsiness, which improved with reducing her pain medication. Pt was seen during rounds on day two and was ready to go home pending progress with therapy. Dressing was clean, dry, and intact.  Pt worked with therapy for one additional session and was meeting their goals. She was discharged to home later that day in stable condition.  Diet: Regular diet Activity: WBAT Follow-up: in 2 weeks Disposition: Home Discharged Condition:  good   Discharge Instructions    Call MD / Call 911   Complete by: As directed    If you experience chest pain or shortness of breath, CALL 911 and be transported to the hospital emergency room.  If you develope a fever above 101 F, pus (white drainage) or increased drainage or redness at the wound, or calf pain, call your surgeon's office.   Change dressing   Complete by: As directed    You have an adhesive waterproof bandage over the incision. Leave this in place until your first follow-up appointment. Once you remove this you will not need to place another bandage.   Constipation Prevention   Complete by: As directed    Drink plenty of fluids.  Prune juice may be helpful.  You  may use a stool softener, such as Colace (over the counter) 100 mg twice a day.  Use MiraLax (over the counter) for constipation as needed.   Diet - low sodium heart healthy   Complete by: As directed    Do not sit on low chairs, stoools or toilet seats, as it may be difficult to get up from low surfaces   Complete by: As directed    Driving restrictions   Complete by: As directed    No driving for two weeks   TED hose   Complete by: As directed    Use stockings (TED hose) for three weeks on both leg(s).  You may remove them at night for sleeping.   Weight bearing as tolerated   Complete by: As directed      Allergies as of 03/02/2020      Reactions   Penicillins Itching, Rash   Has patient had a PCN reaction causing immediate rash, facial/tongue/throat swelling, SOB or lightheadedness with hypotension: yes rash that took a while to go away across the abdomen Has patient had a PCN reaction causing severe rash involving mucus membranes or skin necrosis: no Has patient had a PCN reaction that required hospitalization: no Has patient had a PCN reaction occurring within the last 10 years: No Tolerated Cephalosporin Date: 03/01/20.   Tramadol Itching, Other (See Comments)   hallucinations   Aspirin Nausea And Vomiting, Other (See Comments)   Burning in stomach- tolerates enteric coated   Macrobid [nitrofurantoin Monohyd Macro] Nausea And Vomiting   Sulfa Antibiotics Nausea Only      Medication List    STOP taking these medications   oxyCODONE-acetaminophen 5-325 MG tablet Commonly known as: PERCOCET/ROXICET     TAKE these medications   acetaminophen 325 MG tablet Commonly known as: TYLENOL Take 2 tablets (650 mg total) by mouth every 6 (six) hours as needed for fever, headache, mild pain or moderate pain.   albuterol 108 (90 Base) MCG/ACT inhaler Commonly known as: VENTOLIN HFA Inhale 2 puffs into the lungs every 6 (six) hours as needed for wheezing or shortness of breath.     aspirin 81 MG EC tablet Take 1 tablet (81 mg total) by mouth daily. Swallow whole.   atorvastatin 80 MG tablet Commonly known as: LIPITOR TAKE 1 TABLET BY MOUTH DAILY AT 6 PM.   budesonide-formoterol 160-4.5 MCG/ACT inhaler Commonly known as: SYMBICORT Inhale 2 puffs into the lungs 2 (two) times daily.   clopidogrel 75 MG tablet Commonly known as: PLAVIX Take 1 tablet (75 mg total) by mouth daily. Please keep upcoming appt in July with Dr. Tamala Julian before anymore refills. Thank you   diphenhydrAMINE 25 MG tablet Commonly known as:  BENADRYL Take 50 mg by mouth at bedtime as needed for allergies.   FLUoxetine HCl 60 MG Tabs Take 60 mg by mouth daily.   fluticasone 50 MCG/ACT nasal spray Commonly known as: FLONASE Place 2 sprays into both nostrils daily.   hydrOXYzine 50 MG tablet Commonly known as: ATARAX/VISTARIL TAKE 1 TABLET (50 MG) BY MOUTH EVERY NIGHT AS NEEDED   methocarbamol 500 MG tablet Commonly known as: ROBAXIN Take 1 tablet (500 mg total) by mouth every 6 (six) hours as needed for muscle spasms.   nitroGLYCERIN 0.4 MG SL tablet Commonly known as: NITROSTAT Place 1 tablet (0.4 mg total) under the tongue every 5 (five) minutes as needed for chest pain.   pantoprazole 40 MG tablet Commonly known as: PROTONIX Take 1 tablet (40 mg total) by mouth daily.   pentosan polysulfate 100 MG capsule Commonly known as: ELMIRON Take 200 mg by mouth 2 (two) times daily as needed (side pains).   PROBIOTIC PO Take 1 capsule by mouth at bedtime.   traZODone 150 MG tablet Commonly known as: DESYREL Take 150 mg by mouth at bedtime.            Discharge Care Instructions  (From admission, onward)         Start     Ordered   03/01/20 0000  Weight bearing as tolerated        03/01/20 0727   03/01/20 0000  Change dressing       Comments: You have an adhesive waterproof bandage over the incision. Leave this in place until your first follow-up appointment. Once you  remove this you will not need to place another bandage.   03/01/20 3729          Follow-up Information    Gaynelle Arabian, MD. Schedule an appointment as soon as possible for a visit on 03/13/2020.   Specialty: Orthopedic Surgery Contact information: [address] [place] [address] [phone]               Signed: Griffith Citron, PA-C Orthopedic Surgery 03/06/2020, 11:28 AM

## 2020-03-08 ENCOUNTER — Ambulatory Visit: Payer: PPO | Admitting: Interventional Cardiology

## 2020-03-20 ENCOUNTER — Other Ambulatory Visit: Payer: Self-pay | Admitting: Adult Health

## 2020-03-28 ENCOUNTER — Other Ambulatory Visit (HOSPITAL_COMMUNITY): Payer: Self-pay | Admitting: Psychiatry

## 2020-03-28 ENCOUNTER — Other Ambulatory Visit: Payer: Self-pay | Admitting: Interventional Cardiology

## 2020-03-28 DIAGNOSIS — F332 Major depressive disorder, recurrent severe without psychotic features: Secondary | ICD-10-CM

## 2020-03-29 DIAGNOSIS — Z79891 Long term (current) use of opiate analgesic: Secondary | ICD-10-CM | POA: Diagnosis not present

## 2020-04-04 ENCOUNTER — Encounter: Payer: Self-pay | Admitting: Family Medicine

## 2020-04-12 DIAGNOSIS — M48061 Spinal stenosis, lumbar region without neurogenic claudication: Secondary | ICD-10-CM | POA: Diagnosis not present

## 2020-04-12 DIAGNOSIS — Z79891 Long term (current) use of opiate analgesic: Secondary | ICD-10-CM | POA: Diagnosis not present

## 2020-04-12 DIAGNOSIS — M961 Postlaminectomy syndrome, not elsewhere classified: Secondary | ICD-10-CM | POA: Diagnosis not present

## 2020-04-15 ENCOUNTER — Other Ambulatory Visit: Payer: Self-pay | Admitting: Family Medicine

## 2020-04-15 DIAGNOSIS — J301 Allergic rhinitis due to pollen: Secondary | ICD-10-CM

## 2020-04-16 ENCOUNTER — Other Ambulatory Visit: Payer: Self-pay | Admitting: Family Medicine

## 2020-04-20 ENCOUNTER — Other Ambulatory Visit (HOSPITAL_COMMUNITY): Payer: Self-pay | Admitting: Psychiatry

## 2020-04-26 ENCOUNTER — Encounter: Payer: Self-pay | Admitting: Family Medicine

## 2020-04-26 ENCOUNTER — Telehealth (INDEPENDENT_AMBULATORY_CARE_PROVIDER_SITE_OTHER): Payer: PPO | Admitting: Family Medicine

## 2020-04-26 ENCOUNTER — Other Ambulatory Visit: Payer: Self-pay

## 2020-04-26 DIAGNOSIS — Z889 Allergy status to unspecified drugs, medicaments and biological substances status: Secondary | ICD-10-CM | POA: Diagnosis not present

## 2020-04-26 DIAGNOSIS — R0989 Other specified symptoms and signs involving the circulatory and respiratory systems: Secondary | ICD-10-CM

## 2020-04-26 DIAGNOSIS — Z8719 Personal history of other diseases of the digestive system: Secondary | ICD-10-CM

## 2020-04-26 NOTE — Progress Notes (Signed)
Virtual Visit via Telephone Note  I connected with Bianca [name] on 04/26/20 at  4:40 PM EDT by telephone and verified that I am speaking with the correct person using two identifiers.   I discussed the limitations, risks, security and privacy concerns of performing an evaluation and management service by telephone and the availability of in person appointments. I also discussed with the patient that there may be a patient responsible charge related to this service. The patient expressed understanding and agreed to proceed.  Location patient: home, [place] Location provider: work or home office Participants present for the call: patient, provider Patient did not have a visit in the prior 7 days to address this/these issue(s).   History of Present Illness:  Acute visit for sore throat: -started about 1-2 weeks ago -symptoms initially included sore throat, nasal congestion, lack of appetite, globus sensation, PND, cough, occ emesis when coughing, chest tightness, SOB at times initially, feeling dizzy at times, body aches - but normal for her -has improved for the most part but still having sensation of something in her throat -denies fever, diarrhea, SOB now, CP now -has hx of allergies and sometimes uses flonase -report on protonix chornically -fully vaccinated for COVID19 -hip replacement in July    Observations/Objective: Patient sounds cheerful and well on the phone. I do not appreciate any SOB. Speech and thought processing are grossly intact. Patient reported vitals:  Assessment and Plan:  Globus sensation  Hx of seasonal allergies  Hx of gastroesophageal reflux (GERD)  -we discussed possible serious and likely etiologies, options for evaluation and workup, limitations of telemedicine visit vs in person visit, treatment, treatment risks and precautions. Pt prefers to treat via telemedicine empirically rather then risking or undertaking an in person visit at this moment. Seems  she may have had a viral or acute illness a few weeks ago and now is having what sounds like a globus sensation. Discussed common and serious causes. Query PND from recent VURI vs AR vs flare of GERD vs other. Opted to try her flonase daily, allegra once daily for a few weeks, short course afrin nasal spray, continue the protonix and advised follow up with PCP in 1-2 weeks. Sent message to PCP office to assist with scheduling and advised her to call if does not receive call back in next few days. Advised to seek prompt follow up telemedicine visit or in person care sooner if worsening, new symptoms arise, or if is not improving with treatment.  Follow Up Instructions:  I did not refer this patient for an OV in the next 24 hours for this/these issue(s).  I discussed the assessment and treatment plan with the patient. The patient was provided an opportunity to ask questions and all were answered. The patient agreed with the plan and demonstrated an understanding of the instructions.   The patient was advised to call back or seek an in-person evaluation if the symptoms worsen or if the condition fails to improve as anticipated.  I provided 21 minutes of non-face-to-face time during this encounter.   Lucretia Kern, DO

## 2020-04-27 NOTE — Progress Notes (Signed)
Patient scheduled with [name] 05/01/2020 at 5 PM

## 2020-04-30 DIAGNOSIS — Z471 Aftercare following joint replacement surgery: Secondary | ICD-10-CM | POA: Diagnosis not present

## 2020-04-30 DIAGNOSIS — Z96642 Presence of left artificial hip joint: Secondary | ICD-10-CM | POA: Diagnosis not present

## 2020-05-01 ENCOUNTER — Other Ambulatory Visit: Payer: Self-pay

## 2020-05-01 ENCOUNTER — Ambulatory Visit (INDEPENDENT_AMBULATORY_CARE_PROVIDER_SITE_OTHER): Payer: PPO | Admitting: Adult Health

## 2020-05-01 ENCOUNTER — Encounter: Payer: Self-pay | Admitting: Adult Health

## 2020-05-01 VITALS — BP 102/60 | HR 81 | Temp 97.8°F | Wt 147.0 lb

## 2020-05-01 DIAGNOSIS — F332 Major depressive disorder, recurrent severe without psychotic features: Secondary | ICD-10-CM | POA: Diagnosis not present

## 2020-05-01 DIAGNOSIS — R0989 Other specified symptoms and signs involving the circulatory and respiratory systems: Secondary | ICD-10-CM | POA: Diagnosis not present

## 2020-05-01 NOTE — Progress Notes (Signed)
Subjective:    Patient ID: Bianca [name], female    DOB: 1945/10/01, 74 y.o.   MRN: [id number]  HPI 74 year old female who  has a past medical history of Arthritis, Blood transfusion without reported diagnosis, CAD (coronary artery disease), Carpal tunnel syndrome on both sides, Chronic lower back pain, Depression, GERD (gastroesophageal reflux disease), Hyperlipidemia, Myocardial infarction ([place]) (1997, 2007, 2013), and Numbness of foot.  She presents to the office today for follow-up after being seen by another provider yesterday via virtual visit.  Complaint is that of a feeling of "a lump in my throat and trouble swallowing".  Yesterday she reported that this discomfort started approximately 1 to 2 weeks ago but today she reports that the symptoms started closer to 4 to 6 weeks ago.  Not feel as though she has been choking on food or water but will often, at least once a day throw up after eating.  She denies fever, diarrhea, shortness of breath, or chest pain currently.  She is on Protonix chronically.  He has been seen by GI in the past    Review of Systems See HPI   Past Medical History:  Diagnosis Date   Arthritis    Blood transfusion without reported diagnosis    CAD (coronary artery disease)    a. 1997 MI/PCI RCA;  b. 2007 MI/PCI of 100% RCA with Taxus DES x 3 placed, EF 55%;  c. 2010 Cath: stable anatomy;  d. 05/2012 NSTEMI/Cath/PCI: LM nl, LAD 60-24m, D1 20ost, LCX 57m, RCA 40-23m ISR, 60/95d (Treated w/ 2.75x33 Xience Xpedition DES), PDA 68m (Treated w/ PTCA), EF 60%.   Carpal tunnel syndrome on both sides    Chronic lower back pain    Depression    GERD (gastroesophageal reflux disease)    Hyperlipidemia    Myocardial infarction ([place]) 1997, 2007, 2013   Numbness of foot    Left foot - back surgery 2009    Social History   Socioeconomic History   Marital status: Married    Spouse name: Not on file   Number of children: Not on file   Years of  education: Not on file   Highest education level: Not on file  Occupational History   Not on file  Tobacco Use   Smoking status: Former Smoker    Packs/day: 0.25    Years: 50.00    Pack years: 12.50    Types: Cigarettes    Quit date: 02/2020    Years since quitting: 0.2   Smokeless tobacco: Never Used   Tobacco comment: 5 cigarettes per day  Vaping Use   Vaping Use: Never used  Substance and Sexual Activity   Alcohol use: No    Alcohol/week: 0.0 standard drinks   Drug use: No   Sexual activity: Not Currently    Birth control/protection: Post-menopausal  Other Topics Concern   Not on file  Social History Narrative   Is a homemaker   Married for 28 years    Has a daughter and son    Pets: Two dogs and a Neurosurgeon   Likes to garden ( flower beds).       Social Determinants of Health   Financial Resource Strain:    Difficulty of Paying Living Expenses: Not on file  Food Insecurity:    Worried About Charity fundraiser in the Last Year: Not on file   [employer] of Food in the Last Year: Not on file  Transportation Needs:  Lack of Transportation (Medical): Not on file   Lack of Transportation (Non-Medical): Not on file  Physical Activity:    Days of Exercise per Week: Not on file   Minutes of Exercise per Session: Not on file  Stress:    Feeling of Stress : Not on file  Social Connections:    Frequency of Communication with Friends and Family: Not on file   Frequency of Social Gatherings with Friends and Family: Not on file   Attends Religious Services: Not on file   Active Member of Clubs or Organizations: Not on file   Attends Archivist Meetings: Not on file   Marital Status: Not on file  Intimate Partner Violence:    Fear of Current or Ex-Partner: Not on file   Emotionally Abused: Not on file   Physically Abused: Not on file   Sexually Abused: Not on file    Past Surgical History:  Procedure Laterality Date   ANKLE SURGERY   Years ago   Left; "had to take out a floater"   CARPAL TUNNEL RELEASE Left 10/12016   CARPAL TUNNEL RELEASE Right 02/21/2019   Emerge Lasandra Beech, MD   [place]; 2007, 2013   "2 + 2; + 1= total of 5   CORONARY ARTERY BYPASS GRAFT N/A 05/26/2017   Procedure: CORONARY ARTERY BYPASS GRAFTING (CABG), ON PUMP, TIMES Three, using left internal mammary artery and right greater saphenous vein harvested endoscopically;  Surgeon: [name], MD;  Location: [facility];  Service: Open Heart Surgery;  Laterality: N/A;  LIMA to LAD, SVG to OM 1, SVG to PDA   LEFT HEART CATH AND CORONARY ANGIOGRAPHY N/A 05/26/2017   Procedure: LEFT HEART CATH AND CORONARY ANGIOGRAPHY;  Surgeon: Belva Crome, MD;  Location: [facility];  Service: Cardiovascular;  Laterality: N/A;   LEFT HEART CATHETERIZATION WITH CORONARY ANGIOGRAM N/A 05/18/2012   Procedure: LEFT HEART CATHETERIZATION WITH CORONARY ANGIOGRAM;  Surgeon: Wellington Hampshire, MD;  Location: [facility];  Service: Cardiovascular;  Laterality: N/A;   [place]; 2009   TEE WITHOUT CARDIOVERSION N/A 05/26/2017   Procedure: TRANSESOPHAGEAL ECHOCARDIOGRAM (TEE);  Surgeon: [name], MD;  Location: [facility];  Service: Open Heart Surgery;  Laterality: N/A;   TOTAL HIP ARTHROPLASTY Right 05/24/2014   Procedure: RIGHT TOTAL HIP ARTHROPLASTY ANTERIOR APPROACH;  Surgeon: Gearlean Alf, MD;  Location: [facility];  Service: Orthopedics;  Laterality: Right;   TOTAL HIP ARTHROPLASTY Left 02/29/2020   Procedure: TOTAL HIP ARTHROPLASTY ANTERIOR APPROACH;  Surgeon: Gaynelle Arabian, MD;  Location: [facility];  Service: Orthopedics;  Laterality: Left;  111min    Family History  Problem Relation Age of Onset   Cancer Paternal Grandfather        Esophageal   Coronary artery disease Mother    Diabetes Mother    Hypertension Mother    Dementia Father    Sudden  death Brother        Suicide   Colon cancer Neg Hx     Allergies  Allergen Reactions   Penicillins Itching and Rash    Has patient had a PCN reaction causing immediate rash, facial/tongue/throat swelling, SOB or lightheadedness with hypotension: yes rash that took a while to go away across the abdomen Has patient had a PCN reaction causing severe rash involving mucus membranes or skin necrosis: no Has patient had a PCN reaction that required  hospitalization: no Has patient had a PCN reaction occurring within the last 10 years: No  Tolerated Cephalosporin Date: 03/01/20.   Tramadol Itching and Other (See Comments)    hallucinations   Aspirin Nausea And Vomiting and Other (See Comments)    Burning in stomach- tolerates enteric coated   Macrobid [Nitrofurantoin Monohyd Macro] Nausea And Vomiting   Sulfa Antibiotics Nausea Only    Current Outpatient Medications on File Prior to Visit  Medication Sig Dispense Refill   acetaminophen (TYLENOL) 325 MG tablet Take 2 tablets (650 mg total) by mouth every 6 (six) hours as needed for fever, headache, mild pain or moderate pain.     albuterol (VENTOLIN HFA) 108 (90 Base) MCG/ACT inhaler Inhale 2 puffs into the lungs every 6 (six) hours as needed for wheezing or shortness of breath. 18 g 1   atorvastatin (LIPITOR) 80 MG tablet TAKE 1 TABLET BY MOUTH DAILY AT 6 PM. 90 tablet 3   budesonide-formoterol (SYMBICORT) 160-4.5 MCG/ACT inhaler Inhale 2 puffs into the lungs in the morning and at bedtime. 12 each 3   clopidogrel (PLAVIX) 75 MG tablet Take 1 tablet (75 mg total) by mouth daily. 90 tablet 3   diphenhydrAMINE (BENADRYL) 25 MG tablet Take 50 mg by mouth at bedtime as needed for allergies.      FLUoxetine HCl 60 MG TABS Take 60 mg by mouth daily.      fluticasone (FLONASE) 50 MCG/ACT nasal spray SPRAY 2 SPRAYS INTO EACH NOSTRIL EVERY DAY 48 mL 0   hydrOXYzine (ATARAX/VISTARIL) 50 MG tablet TAKE 1 TABLET (50 MG) BY MOUTH EVERY NIGHT  AS NEEDED     methocarbamol (ROBAXIN) 500 MG tablet Take 1 tablet (500 mg total) by mouth every 6 (six) hours as needed for muscle spasms. 40 tablet 0   morphine (MSIR) 15 MG tablet Take 15 mg by mouth 3 (three) times daily as needed for severe pain.     pantoprazole (PROTONIX) 40 MG tablet TAKE 1 TABLET BY MOUTH EVERY DAY 90 tablet 1   pentosan polysulfate (ELMIRON) 100 MG capsule Take 200 mg by mouth 2 (two) times daily as needed (side pains).      Probiotic Product (PROBIOTIC PO) Take 1 capsule by mouth at bedtime.      traZODone (DESYREL) 150 MG tablet Take 150 mg by mouth at bedtime.     nitroGLYCERIN (NITROSTAT) 0.4 MG SL tablet Place 1 tablet (0.4 mg total) under the tongue every 5 (five) minutes as needed for chest pain. 50 tablet 3   No current facility-administered medications on file prior to visit.    BP 102/60 (BP Location: Left Arm, Patient Position: Sitting, Cuff Size: Normal)    Pulse 81    Temp 97.8 F (36.6 C) (Oral)    Wt 147 lb (66.7 kg)    LMP  (LMP Unknown)    SpO2 94%    BMI 26.04 kg/m       Objective:   Physical Exam Vitals and nursing note reviewed.  Constitutional:      Appearance: Normal appearance.  HENT:     Mouth/Throat:     Mouth: Mucous membranes are moist.     Pharynx: Oropharynx is clear. No oropharyngeal exudate or posterior oropharyngeal erythema.  Cardiovascular:     Rate and Rhythm: Normal rate and regular rhythm.     Pulses: Normal pulses.     Heart sounds: Normal heart sounds.  Pulmonary:     Effort: Pulmonary effort is normal.  Breath sounds: Normal breath sounds.  Musculoskeletal:     Cervical back: Normal range of motion and neck supple. No rigidity or tenderness.  Lymphadenopathy:     Cervical: No cervical adenopathy.  Skin:    General: Skin is warm and dry.  Neurological:     General: No focal deficit present.     Mental Status: She is alert and oriented to person, place, and time.  Psychiatric:        Mood and Affect:  Mood normal.        Behavior: Behavior normal.        Thought Content: Thought content normal.        Judgment: Judgment normal.       Assessment & Plan:  1. Globus sensation - She has been seen by GI in the past for various issues. I recommend she call and schedule an appointment due to time frame of globulus sensation.   [name], NP

## 2020-05-01 NOTE — Patient Instructions (Signed)
Please call Gastroenterology  Phone: [phone]  For constipation please use a bottle of magnesium citrate tonight   After that start using a stool softener while taking Morphine

## 2020-05-02 ENCOUNTER — Telehealth: Payer: Self-pay | Admitting: Gastroenterology

## 2020-05-02 NOTE — Telephone Encounter (Signed)
Left message on machine to call back  

## 2020-05-02 NOTE — Telephone Encounter (Signed)
Pt is requesting a call back from a nurse to discuss her symptoms, pt is experiencing difficulty swallowing

## 2020-05-03 ENCOUNTER — Other Ambulatory Visit: Payer: Self-pay

## 2020-05-03 ENCOUNTER — Encounter (HOSPITAL_COMMUNITY): Payer: Self-pay

## 2020-05-03 ENCOUNTER — Emergency Department (HOSPITAL_COMMUNITY)
Admission: EM | Admit: 2020-05-03 | Discharge: 2020-05-03 | Disposition: A | Discharge status: Home / Self Care | Payer: PPO | Attending: Emergency Medicine | Admitting: Emergency Medicine

## 2020-05-03 DIAGNOSIS — J029 Acute pharyngitis, unspecified: Secondary | ICD-10-CM | POA: Diagnosis not present

## 2020-05-03 DIAGNOSIS — Z5321 Procedure and treatment not carried out due to patient leaving prior to being seen by health care provider: Secondary | ICD-10-CM | POA: Insufficient documentation

## 2020-05-03 DIAGNOSIS — R197 Diarrhea, unspecified: Secondary | ICD-10-CM | POA: Insufficient documentation

## 2020-05-03 LAB — COMPREHENSIVE METABOLIC PANEL
ALT: 11 U/L (ref 0–44)
AST: 21 U/L (ref 15–41)
Albumin: 3.5 g/dL (ref 3.5–5.0)
Alkaline Phosphatase: 119 U/L (ref 38–126)
Anion gap: 12 (ref 5–15)
BUN: 8 mg/dL (ref 8–23)
CO2: 23 mmol/L (ref 22–32)
Calcium: 9.3 mg/dL (ref 8.9–10.3)
Chloride: 101 mmol/L (ref 98–111)
Creatinine, Ser: 0.81 mg/dL (ref 0.44–1.00)
GFR calc Af Amer: >60 mL/min (ref >60)
GFR calc non Af Amer: >60 mL/min (ref >60)
Glucose, Bld: 152 mg/dL — ABNORMAL HIGH (ref 70–99)
Potassium: 3.2 mmol/L — ABNORMAL LOW (ref 3.5–5.1)
Sodium: 136 mmol/L (ref 135–145)
Total Bilirubin: 1 mg/dL (ref 0.3–1.2)
Total Protein: 6.9 g/dL (ref 6.5–8.1)

## 2020-05-03 LAB — CBC
HCT: 38.4 % (ref 36.0–46.0)
Hemoglobin: 12.8 g/dL (ref 12.0–15.0)
MCH: 30.5 pg (ref 26.0–34.0)
MCHC: 33.3 g/dL (ref 30.0–36.0)
MCV: 91.4 fL (ref 80.0–100.0)
Platelets: 256 10*3/uL (ref 150–400)
RBC: 4.2 MIL/uL (ref 3.87–5.11)
RDW: 12.9 % (ref 11.5–15.5)
WBC: 7.6 10*3/uL (ref 4.0–10.5)
nRBC: 0 % (ref 0.0–0.2)

## 2020-05-03 LAB — LIPASE, BLOOD: Lipase: 23 U/L (ref 11–51)

## 2020-05-03 NOTE — Telephone Encounter (Signed)
Left message on machine to call back  

## 2020-05-03 NOTE — ED Triage Notes (Signed)
Patient arrived with complaints of diarrhea over the last three days, declines any abdominal pain, nausea or vomiting.  Patient also has complaints of a sore throat post intubation.

## 2020-05-04 ENCOUNTER — Other Ambulatory Visit: Payer: Self-pay | Admitting: Adult Health

## 2020-05-04 DIAGNOSIS — R1110 Vomiting, unspecified: Secondary | ICD-10-CM | POA: Diagnosis not present

## 2020-05-04 DIAGNOSIS — R634 Abnormal weight loss: Secondary | ICD-10-CM | POA: Diagnosis not present

## 2020-05-04 DIAGNOSIS — J301 Allergic rhinitis due to pollen: Secondary | ICD-10-CM

## 2020-05-04 DIAGNOSIS — R5383 Other fatigue: Secondary | ICD-10-CM | POA: Diagnosis not present

## 2020-05-04 NOTE — Telephone Encounter (Signed)
Noted thank you

## 2020-05-04 NOTE — Telephone Encounter (Signed)
Spouse called back and appointment was scheduled w/[facility] for 06-05-20.

## 2020-05-10 ENCOUNTER — Emergency Department (HOSPITAL_BASED_OUTPATIENT_CLINIC_OR_DEPARTMENT_OTHER): Payer: PPO

## 2020-05-10 ENCOUNTER — Other Ambulatory Visit: Payer: Self-pay

## 2020-05-10 ENCOUNTER — Encounter (HOSPITAL_BASED_OUTPATIENT_CLINIC_OR_DEPARTMENT_OTHER): Payer: Self-pay

## 2020-05-10 ENCOUNTER — Emergency Department (HOSPITAL_BASED_OUTPATIENT_CLINIC_OR_DEPARTMENT_OTHER)
Admission: EM | Admit: 2020-05-10 | Discharge: 2020-05-10 | Disposition: A | Discharge status: Home / Self Care | Payer: PPO | Attending: Emergency Medicine | Admitting: Emergency Medicine

## 2020-05-10 DIAGNOSIS — Z79899 Other long term (current) drug therapy: Secondary | ICD-10-CM | POA: Insufficient documentation

## 2020-05-10 DIAGNOSIS — R0602 Shortness of breath: Secondary | ICD-10-CM | POA: Diagnosis not present

## 2020-05-10 DIAGNOSIS — N3000 Acute cystitis without hematuria: Secondary | ICD-10-CM | POA: Diagnosis not present

## 2020-05-10 DIAGNOSIS — R634 Abnormal weight loss: Secondary | ICD-10-CM | POA: Diagnosis not present

## 2020-05-10 DIAGNOSIS — I25118 Atherosclerotic heart disease of native coronary artery with other forms of angina pectoris: Secondary | ICD-10-CM | POA: Diagnosis not present

## 2020-05-10 DIAGNOSIS — K219 Gastro-esophageal reflux disease without esophagitis: Secondary | ICD-10-CM | POA: Insufficient documentation

## 2020-05-10 DIAGNOSIS — Z87891 Personal history of nicotine dependence: Secondary | ICD-10-CM | POA: Diagnosis not present

## 2020-05-10 DIAGNOSIS — R0789 Other chest pain: Secondary | ICD-10-CM | POA: Insufficient documentation

## 2020-05-10 DIAGNOSIS — N3001 Acute cystitis with hematuria: Secondary | ICD-10-CM

## 2020-05-10 DIAGNOSIS — R509 Fever, unspecified: Secondary | ICD-10-CM | POA: Diagnosis not present

## 2020-05-10 DIAGNOSIS — D485 Neoplasm of uncertain behavior of skin: Secondary | ICD-10-CM | POA: Diagnosis not present

## 2020-05-10 DIAGNOSIS — Z96643 Presence of artificial hip joint, bilateral: Secondary | ICD-10-CM | POA: Insufficient documentation

## 2020-05-10 DIAGNOSIS — Z951 Presence of aortocoronary bypass graft: Secondary | ICD-10-CM | POA: Insufficient documentation

## 2020-05-10 DIAGNOSIS — E876 Hypokalemia: Secondary | ICD-10-CM | POA: Insufficient documentation

## 2020-05-10 LAB — TROPONIN I (HIGH SENSITIVITY)
Troponin I (High Sensitivity): 19 ng/L — ABNORMAL HIGH (ref <18)
Troponin I (High Sensitivity): 18 ng/L — ABNORMAL HIGH (ref <18)

## 2020-05-10 LAB — BASIC METABOLIC PANEL
Anion gap: 15 (ref 5–15)
BUN: 5 mg/dL — ABNORMAL LOW (ref 8–23)
CO2: 18 mmol/L — ABNORMAL LOW (ref 22–32)
Calcium: 9.1 mg/dL (ref 8.9–10.3)
Chloride: 98 mmol/L (ref 98–111)
Creatinine, Ser: 1.03 mg/dL — ABNORMAL HIGH (ref 0.44–1.00)
GFR calc Af Amer: >60 mL/min (ref >60)
GFR calc non Af Amer: 54 mL/min — ABNORMAL LOW (ref >60)
Glucose, Bld: 168 mg/dL — ABNORMAL HIGH (ref 70–99)
Potassium: 3 mmol/L — ABNORMAL LOW (ref 3.5–5.1)
Sodium: 131 mmol/L — ABNORMAL LOW (ref 135–145)

## 2020-05-10 LAB — CBC WITH DIFFERENTIAL/PLATELET
Abs Immature Granulocytes: 0.03 10*3/uL (ref 0.00–0.07)
Basophils Absolute: 0.1 10*3/uL (ref 0.0–0.1)
Basophils Relative: 1 %
Eosinophils Absolute: 0.1 10*3/uL (ref 0.0–0.5)
Eosinophils Relative: 1 %
HCT: 38.8 % (ref 36.0–46.0)
Hemoglobin: 13.4 g/dL (ref 12.0–15.0)
Immature Granulocytes: 0 %
Lymphocytes Relative: 18 %
Lymphs Abs: 1.6 10*3/uL (ref 0.7–4.0)
MCH: 31 pg (ref 26.0–34.0)
MCHC: 34.5 g/dL (ref 30.0–36.0)
MCV: 89.8 fL (ref 80.0–100.0)
Monocytes Absolute: 0.5 10*3/uL (ref 0.1–1.0)
Monocytes Relative: 6 %
Neutro Abs: 6.4 10*3/uL (ref 1.7–7.7)
Neutrophils Relative %: 74 %
Platelets: 260 10*3/uL (ref 150–400)
RBC: 4.32 MIL/uL (ref 3.87–5.11)
RDW: 13.2 % (ref 11.5–15.5)
WBC: 8.7 10*3/uL (ref 4.0–10.5)
nRBC: 0 % (ref 0.0–0.2)

## 2020-05-10 LAB — URINALYSIS, ROUTINE W REFLEX MICROSCOPIC
Glucose, UA: NEGATIVE mg/dL
Ketones, ur: NEGATIVE mg/dL
Nitrite: POSITIVE — AB
Protein, ur: 30 mg/dL — AB
Specific Gravity, Urine: >1.03 — ABNORMAL HIGH (ref 1.005–1.030)
pH: 6 (ref 5.0–8.0)

## 2020-05-10 LAB — HEPATIC FUNCTION PANEL
ALT: 15 U/L (ref 0–44)
AST: 33 U/L (ref 15–41)
Albumin: 3.1 g/dL — ABNORMAL LOW (ref 3.5–5.0)
Alkaline Phosphatase: 93 U/L (ref 38–126)
Bilirubin, Direct: 0.1 mg/dL (ref 0.0–0.2)
Indirect Bilirubin: 0.2 mg/dL — ABNORMAL LOW (ref 0.3–0.9)
Total Bilirubin: 0.3 mg/dL (ref 0.3–1.2)
Total Protein: 6 g/dL — ABNORMAL LOW (ref 6.5–8.1)

## 2020-05-10 LAB — URINALYSIS, MICROSCOPIC (REFLEX): WBC, UA: >50 WBC/hpf (ref 0–5)

## 2020-05-10 LAB — D-DIMER, QUANTITATIVE: D-Dimer, Quant: 1.56 ug/mL-FEU — ABNORMAL HIGH (ref 0.00–0.50)

## 2020-05-10 LAB — BRAIN NATRIURETIC PEPTIDE: B Natriuretic Peptide: 141.8 pg/mL — ABNORMAL HIGH (ref 0.0–100.0)

## 2020-05-10 LAB — LIPASE, BLOOD: Lipase: 21 U/L (ref 11–51)

## 2020-05-10 MED ORDER — IOHEXOL 350 MG/ML SOLN
100.0000 mL | Freq: Once | INTRAVENOUS | Status: AC | PRN
  Administered 2020-05-10: 100 mL via INTRAVENOUS

## 2020-05-10 MED ORDER — FAMOTIDINE 20 MG PO TABS
40.0000 mg | ORAL_TABLET | Freq: Once | ORAL | Status: AC
  Administered 2020-05-10: 40 mg via ORAL
  Filled 2020-05-10: qty 2

## 2020-05-10 MED ORDER — FAMOTIDINE 20 MG PO TABS: 20.0000 mg | ORAL_TABLET | Freq: Two times a day (BID) | ORAL | 0 refills | Status: DC

## 2020-05-10 MED ORDER — POTASSIUM CHLORIDE CRYS ER 20 MEQ PO TBCR: 20.0000 meq | EXTENDED_RELEASE_TABLET | Freq: Two times a day (BID) | ORAL | 0 refills | Status: DC

## 2020-05-10 MED ORDER — POTASSIUM CHLORIDE CRYS ER 20 MEQ PO TBCR
40.0000 meq | EXTENDED_RELEASE_TABLET | Freq: Once | ORAL | Status: AC
  Administered 2020-05-10: 40 meq via ORAL
  Filled 2020-05-10: qty 2

## 2020-05-10 MED ORDER — CEPHALEXIN 250 MG PO CAPS
250.0000 mg | ORAL_CAPSULE | Freq: Once | ORAL | Status: AC
  Administered 2020-05-10: 250 mg via ORAL
  Filled 2020-05-10: qty 1

## 2020-05-10 MED ORDER — CEPHALEXIN 500 MG PO CAPS: 1000.0000 mg | ORAL_CAPSULE | Freq: Two times a day (BID) | ORAL | 0 refills | Status: DC

## 2020-05-10 MED ORDER — POTASSIUM CHLORIDE IN NACL 20-0.9 MEQ/L-% IV SOLN
Freq: Once | INTRAVENOUS | Status: AC
  Filled 2020-05-10: qty 1000

## 2020-05-10 NOTE — ED Notes (Signed)
Patient transported to CT via wheelchair

## 2020-05-10 NOTE — ED Notes (Signed)
AVS reviewed with husband and pt, discussed each of the dx by the EDP, also provided pt teaching for each Rx also provided. Copy of AVS also provided. Pt also informed where Rx x 3 were electroniclly sent. Opportunity for questions provided as well.

## 2020-05-10 NOTE — ED Notes (Signed)
Up to restroom, gait steady, required min assistance, up to void and provide urine specimen

## 2020-05-10 NOTE — ED Triage Notes (Addendum)
Pt c/o intermittent SOB since hip replacement surgery 7/14-worse x 1 week-pt also concerned with 25lb weight loss since surgery-also c/o "hurt all over"/ chronic pain-pt NAD-steady gait

## 2020-05-10 NOTE — ED Provider Notes (Signed)
[facility] EMERGENCY DEPARTMENT Provider Note   CSN: [id number] Arrival date & time: 05/10/20  1532     History Chief Complaint  Patient presents with  . Shortness of Breath    Bianca [name] is a 74 y.o. female.  HPI Patient reports that she has had some issues with weight loss and chest discomfort since her hip replacement in July.  She reports that the hip replacement seemed to go well.  She recovered well.  She reports however that she is just continued to lose weight and estimates she is lost about 20 pounds.  She reports she eats but then she feels like the food kind of wells back up into her throat and then she spits it out.  She reports that she can tolerate fluids.  She reports she has been experiencing upper chest tightness quality that does radiate into her neck.  This has been going on probably for over a month.  Waxes and wanes.  She became more concerned because over the past 2 days she started to get significantly short of breath.  She reports even minor activity can make her feel very short of breath.  Somewhat improved by rest.  She denies any significant coughing.  Patient has had Covid vaccine.  She has not had fevers, body aches or chills.  She does report she gets a lot of aching and muscle cramps in her legs.  This is particularly occurs at night.  About 5 days ago she did stop taking morphine.  She reports she stopped this because she thought that it might be giving her side effects and symptoms that were causing her problems more than relieving them.    Past Medical History:  Diagnosis Date  . Arthritis   . Blood transfusion without reported diagnosis   . CAD (coronary artery disease)    a. 1997 MI/PCI RCA;  b. 2007 MI/PCI of 100% RCA with Taxus DES x 3 placed, EF 55%;  c. 2010 Cath: stable anatomy;  d. 05/2012 NSTEMI/Cath/PCI: LM nl, LAD 60-88m, D1 20ost, LCX 29m, RCA 40-28m ISR, 60/95d (Treated w/ 2.75x33 Xience Xpedition DES), PDA 65m (Treated w/ PTCA),  EF 60%.  . Carpal tunnel syndrome on both sides   . Chronic lower back pain   . Depression   . GERD (gastroesophageal reflux disease)   . Hyperlipidemia   . Myocardial infarction [facility]) 1997, 2007, 2013  . Numbness of foot    Left foot - back surgery 2009    Patient Active Problem List   Diagnosis Date Noted  . Primary osteoarthritis of left hip 02/29/2020  . Coronary artery disease involving coronary bypass graft of native heart with angina pectoris ([place]) 08/28/2017  . Postoperative atrial fibrillation ([place]) 08/28/2017  . RAD (reactive airway disease) 04/01/2016  . B12 deficiency 07/11/2015  . Anemia associated with acute blood loss 05/28/2014  . Cigarette smoker 05/28/2014  . OA (osteoarthritis) of hip 05/24/2014  . Allergic rhinitis due to pollen 04/01/2012  . Back pain 11/22/2010  . NEOPLASM OF UNCERTAIN BEHAVIOR OF SKIN 06/05/2010  . Swallowing difficulty 06/04/2010  . GERD 07/30/2009  . Hyperlipidemia with target LDL less than 70 03/02/2007  . Depression 03/02/2007    Past Surgical History:  Procedure Laterality Date  . ANKLE SURGERY  Years ago   Left; "had to take out a floater"  . CARPAL TUNNEL RELEASE Left 10/12016  . CARPAL TUNNEL RELEASE Right 02/21/2019   Emerge Lasandra Beech, MD  . [place]; 2007, 2013   "2 + 2; + 1= total of 5  . CORONARY ARTERY BYPASS GRAFT N/A 05/26/2017   Procedure: CORONARY ARTERY BYPASS GRAFTING (CABG), ON PUMP, TIMES Three, using left internal mammary artery and right greater saphenous vein harvested endoscopically;  Surgeon: [name], MD;  Location: [facility];  Service: Open Heart Surgery;  Laterality: N/A;  LIMA to LAD, SVG to OM 1, SVG to PDA  . LEFT HEART CATH AND CORONARY ANGIOGRAPHY N/A 05/26/2017   Procedure: LEFT HEART CATH AND CORONARY ANGIOGRAPHY;  Surgeon: Belva Crome, MD;  Location: [facility];  Service: Cardiovascular;  Laterality: N/A;    . LEFT HEART CATHETERIZATION WITH CORONARY ANGIOGRAM N/A 05/18/2012   Procedure: LEFT HEART CATHETERIZATION WITH CORONARY ANGIOGRAM;  Surgeon: Wellington Hampshire, MD;  Location: [facility];  Service: Cardiovascular;  Laterality: N/A;  . [place]; 2009  . TEE WITHOUT CARDIOVERSION N/A 05/26/2017   Procedure: TRANSESOPHAGEAL ECHOCARDIOGRAM (TEE);  Surgeon: [name], MD;  Location: [facility];  Service: Open Heart Surgery;  Laterality: N/A;  . TOTAL HIP ARTHROPLASTY Right 05/24/2014   Procedure: RIGHT TOTAL HIP ARTHROPLASTY ANTERIOR APPROACH;  Surgeon: Gearlean Alf, MD;  Location: [facility];  Service: Orthopedics;  Laterality: Right;  . TOTAL HIP ARTHROPLASTY Left 02/29/2020   Procedure: TOTAL HIP ARTHROPLASTY ANTERIOR APPROACH;  Surgeon: Gaynelle Arabian, MD;  Location: [facility];  Service: Orthopedics;  Laterality: Left;  174min     OB History    Gravida  2   Para  2   Term      Preterm      AB      Living  2     SAB      TAB      Ectopic      Multiple      Live Births              Family History  Problem Relation Age of Onset  . Cancer Paternal Grandfather        Esophageal  . Coronary artery disease Mother   . Diabetes Mother   . Hypertension Mother   . Dementia Father   . Sudden death Brother        Suicide  . Colon cancer Neg Hx     Social History   Tobacco Use  . Smoking status: Former Smoker    Packs/day: 0.25    Years: 50.00    Pack years: 12.50    Types: Cigarettes    Quit date: 02/2020    Years since quitting: 0.2  . Smokeless tobacco: Never Used  . Tobacco comment: 5 cigarettes per day  Vaping Use  . Vaping Use: Never used  Substance Use Topics  . Alcohol use: No    Alcohol/week: 0.0 standard drinks  . Drug use: No    Home Medications Prior to Admission medications   Medication Sig Start Date End Date Taking? Authorizing Provider  acetaminophen (TYLENOL) 325 MG tablet Take 2 tablets (650 mg total) by mouth  every 6 (six) hours as needed for fever, headache, mild pain or moderate pain. 06/04/17   Nani Skillern, PA-C  albuterol (VENTOLIN HFA) 108 (90 Base) MCG/ACT inhaler Inhale 2 puffs into the lungs every 6 (six) hours as needed for wheezing or shortness of breath. 01/23/20   Martinique, Betty G, MD  atorvastatin (LIPITOR) 80 MG tablet TAKE 1 TABLET  BY MOUTH DAILY AT 6 PM. 02/24/20   Belva Crome, MD  budesonide-formoterol [facility]) 160-4.5 MCG/ACT inhaler Inhale 2 puffs into the lungs in the morning and at bedtime. 04/17/20 07/16/20  Nafziger, Tommi Rumps, NP  cephALEXin (KEFLEX) 500 MG capsule Take 2 capsules (1,000 mg total) by mouth 2 (two) times daily. 05/10/20   Charlesetta Shanks, MD  clopidogrel (PLAVIX) 75 MG tablet Take 1 tablet (75 mg total) by mouth daily. 03/28/20   Belva Crome, MD  diphenhydrAMINE (BENADRYL) 25 MG tablet Take 50 mg by mouth at bedtime as needed for allergies.     [provider]  famotidine (PEPCID) 20 MG tablet Take 1 tablet (20 mg total) by mouth 2 (two) times daily. 05/10/20   Charlesetta Shanks, MD  FLUoxetine HCl 60 MG TABS Take 60 mg by mouth daily.  01/10/20   [provider]  fluticasone (FLONASE) 50 MCG/ACT nasal spray SPRAY 2 SPRAYS INTO EACH NOSTRIL EVERY DAY 05/04/20   Nafziger, Tommi Rumps, NP  hydrOXYzine (ATARAX/VISTARIL) 50 MG tablet TAKE 1 TABLET (50 MG) BY MOUTH EVERY NIGHT AS NEEDED 11/21/18   [provider]  methocarbamol (ROBAXIN) 500 MG tablet Take 1 tablet (500 mg total) by mouth every 6 (six) hours as needed for muscle spasms. 03/01/20   Edmisten, Kristie L, PA  morphine (MSIR) 15 MG tablet Take 15 mg by mouth 3 (three) times daily as needed for severe pain.    Suella Broad, MD  nitroGLYCERIN (NITROSTAT) 0.4 MG SL tablet Place 1 tablet (0.4 mg total) under the tongue every 5 (five) minutes as needed for chest pain. 11/01/18 02/15/20  Belva Crome, MD  pantoprazole (PROTONIX) 40 MG tablet TAKE 1 TABLET BY MOUTH EVERY DAY 03/21/20   Nafziger,  Tommi Rumps, NP  pentosan polysulfate (ELMIRON) 100 MG capsule Take 200 mg by mouth 2 (two) times daily as needed (side pains).  04/09/17   [provider]  potassium chloride SA (KLOR-CON) 20 MEQ tablet Take 1 tablet (20 mEq total) by mouth 2 (two) times daily. 05/10/20   Charlesetta Shanks, MD  Probiotic Product (PROBIOTIC PO) Take 1 capsule by mouth at bedtime.     [provider]  traZODone (DESYREL) 150 MG tablet Take 150 mg by mouth at bedtime. 01/03/20   [provider]    Allergies    Penicillins, Tramadol, Aspirin, Macrobid [nitrofurantoin monohyd macro], and Sulfa antibiotics  Review of Systems   Review of Systems 10 systems reviewed and negative except as per HPI Physical Exam Updated Vital Signs BP (!) 146/71 (BP Location: Right Arm)   Pulse 64   Temp 98.3 F (36.8 C) (Oral)   Resp 18   Ht 5\' 2"  (1.575 m)   Wt 64.9 kg   LMP  (LMP Unknown)   SpO2 95%   BMI 26.16 kg/m   Physical Exam Constitutional:      Comments: Alert and nontoxic.  Clinically well in appearance.  No respiratory distress at rest.  HENT:     Mouth/Throat:     Pharynx: Oropharynx is clear.  Cardiovascular:     Rate and Rhythm: Normal rate and regular rhythm.  Pulmonary:     Effort: Pulmonary effort is normal.     Breath sounds: Normal breath sounds.  Abdominal:     General: There is no distension.     Palpations: Abdomen is soft.     Tenderness: There is no abdominal tenderness. There is no guarding.  Musculoskeletal:     Comments: No peripheral edema.  Patient however does endorse tenderness to palpation of the left calf.  Lower legs and feet are in very good condition without any wounds or swelling.  Feet are warm and dry the touch.  Dorsalis pedis pulses are 2+ and symmetric.  Skin:    General: Skin is warm and dry.  Neurological:     General: No focal deficit present.     Mental Status: She is oriented to person, place, and time.     Motor: No weakness.     Coordination:  Coordination normal.  Psychiatric:        Mood and Affect: Mood normal.     ED Results / Procedures / Treatments   Labs (all labs ordered are listed, but only abnormal results are displayed) Labs Reviewed  BASIC METABOLIC PANEL - Abnormal; Notable for the following components:      Result Value   Sodium 131 (*)    Potassium 3.0 (*)    CO2 18 (*)    Glucose, Bld 168 (*)    BUN 5 (*)    Creatinine, Ser 1.03 (*)    GFR calc non Af Amer 54 (*)    All other components within normal limits  HEPATIC FUNCTION PANEL - Abnormal; Notable for the following components:   Total Protein 6.0 (*)    Albumin 3.1 (*)    Indirect Bilirubin 0.2 (*)    All other components within normal limits  BRAIN NATRIURETIC PEPTIDE - Abnormal; Notable for the following components:   B Natriuretic Peptide 141.8 (*)    All other components within normal limits  D-DIMER, QUANTITATIVE (NOT AT [facility]) - Abnormal; Notable for the following components:   D-Dimer, Quant 1.56 (*)    All other components within normal limits  URINALYSIS, ROUTINE W REFLEX MICROSCOPIC - Abnormal; Notable for the following components:   APPearance CLOUDY (*)    Specific Gravity, Urine >1.030 (*)    Hgb urine dipstick MODERATE (*)    Bilirubin Urine SMALL (*)    Protein, ur 30 (*)    Nitrite POSITIVE (*)    Leukocytes,Ua MODERATE (*)    All other components within normal limits  URINALYSIS, MICROSCOPIC (REFLEX) - Abnormal; Notable for the following components:   Bacteria, UA MANY (*)    All other components within normal limits  TROPONIN I (HIGH SENSITIVITY) - Abnormal; Notable for the following components:   Troponin I (High Sensitivity) 19 (*)    All other components within normal limits  TROPONIN I (HIGH SENSITIVITY) - Abnormal; Notable for the following components:   Troponin I (High Sensitivity) 18 (*)    All other components within normal limits  URINE CULTURE  CBC WITH DIFFERENTIAL/PLATELET  LIPASE, BLOOD    EKG EKG  Interpretation  Date/Time:  Thursday May 10 2020 15:49:00 EDT Ventricular Rate:  76 PR Interval:  138 QRS Duration: 74 QT Interval:  420 QTC Calculation: 472 R Axis:   82 Text Interpretation: Normal sinus rhythm Confirmed by Charlesetta Shanks [phone]) on 05/10/2020 7:35:04 PM   Radiology DG Chest 2 View  Result Date: 05/10/2020 CLINICAL DATA:  Shortness of breath X 1 week, denies fever flu symptoms. History of CABG. EXAM: CHEST - 2 VIEW COMPARISON:  Chest x-ray 02/02/2020, CT chest 05/25/2017 FINDINGS: Coronary artery stent. Redemonstration of sternotomy wires with the superior most wires chronically fractured. The heart size and mediastinal contours are within normal limits. No focal consolidation. No pulmonary edema. Similar blunting of the costophrenic angles on lateral view. No pleural effusion.  No pneumothorax. No acute osseous abnormality. IMPRESSION: No active cardiopulmonary disease. Electronically Signed   By: Iven Finn M.D.   On: 05/10/2020 16:34   CT Angio Chest PE W/Cm &/Or Wo Cm  Result Date: 05/10/2020 CLINICAL DATA:  Intermittent shortness of breath since hip replacement surgery 02/29/2020, weight loss EXAM: CT ANGIOGRAPHY CHEST WITH CONTRAST TECHNIQUE: Multidetector CT imaging of the chest was performed using the standard protocol during bolus administration of intravenous contrast. Multiplanar CT image reconstructions and MIPs were obtained to evaluate the vascular anatomy. CONTRAST:  169mL OMNIPAQUE IOHEXOL 350 MG/ML SOLN COMPARISON:  05/25/2017, 05/10/2020 FINDINGS: Cardiovascular: This is a technically adequate evaluation of the pulmonary vasculature. No filling defects or pulmonary emboli. Prominence of the main pulmonary arteries may reflect underlying pulmonary arterial hypertension, stable. The heart is unremarkable without pericardial effusion. Postsurgical changes are seen from previous bypass surgery. Mild atherosclerosis throughout the thoracic aorta. No  aneurysm or dissection. Mediastinum/Nodes: No enlarged mediastinal, hilar, or axillary lymph nodes. Thyroid gland, trachea, and esophagus demonstrate no significant findings. Lungs/Pleura: Mild upper lobe predominant emphysema. No airspace disease, effusion, or pneumothorax. The central airways are patent. Upper Abdomen: No acute abnormality. Musculoskeletal: No acute or destructive bony lesions. Reconstructed images demonstrate no additional findings. Review of the MIP images confirms the above findings. IMPRESSION: 1. No evidence of pulmonary embolus. 2. Stable prominence of the main pulmonary arteries may reflect underlying pulmonary arterial hypertension. 3. No acute intrathoracic process. 4. Aortic Atherosclerosis (ICD10-I70.0) and Emphysema (ICD10-J43.9). Electronically Signed   By: Randa Ngo M.D.   On: 05/10/2020 18:02    Procedures Procedures (including critical care time)  Medications Ordered in ED Medications  0.9 % NaCl with KCl 20 mEq/ L  infusion ( Intravenous Stopped 05/10/20 1934)  potassium chloride SA (KLOR-CON) CR tablet 40 mEq (40 mEq Oral Given 05/10/20 1746)  iohexol (OMNIPAQUE) 350 MG/ML injection 100 mL (100 mLs Intravenous Contrast Given 05/10/20 1729)  cephALEXin (KEFLEX) capsule 250 mg (250 mg Oral Given 05/10/20 1830)  famotidine (PEPCID) tablet 40 mg (40 mg Oral Given 05/10/20 1830)    ED Course  I have reviewed the triage vital signs and the nursing notes.  Pertinent labs & imaging results that were available during my care of the patient were reviewed by me and considered in my medical decision making (see chart for details).    MDM Rules/Calculators/A&P                         Patient has several different symptoms.  Generally, she has had weight loss and decreased appetite and difficulty eating she traces back since July.  She describes some fairly severe reflux symptoms with agitation of more solid food but tolerating liquids and soup.  Will start on Pepcid  twice daily and provide information for management of reflux.  Patient is already being referred to gastroenterology by her PCP for further evaluation.  ET scan of the chest does not show any acute concerning findings to suggest malignancy or obstruction.  Patient has no difficulty handling her secretions.  Patient is hypokalemic.  May be secondary to low dietary intake or absorption.  Patient also has recently stopped taking morphine and had some increased diarrheal stool.  May be contributing.  Will have her take potassium supplement for the next 5 days with recommended recheck at the beginning of the week.  Patient reports increasing shortness of breath over the past several days.  He did describe pain in her legs  and endorsed calf pain to compression on the left.  D-dimer was mildly elevated.  CTA does not exhibit any pulmonary embolus.  Troponins are flat.  At this time does not appear to be acute MI.  Did review the fact patient has cardiac disease and will require close follow-up with her cardiologist to continue monitoring for any anginal equivalent type symptoms.  Careful return precautions reviewed.  Patient has grossly positive urine.  He has describes some generalized situational symptoms.  Will opt to treat empirically with Keflex.  Culture will be pending.  Patient informed of the incidental finding of possible pulmonary hypertension by CT scan.  She is advised she needs to make her family physician aware of this finding for continued monitoring.  Patient does not have objective dyspnea in the emergency department, she has no hypoxia.  At this time I feel she is stable for discharge with continued outpatient management. Final Clinical Impression(s) / ED Diagnoses Final diagnoses:  Shortness of breath  Other chest pain  Weight loss  Hypokalemia  Gastroesophageal reflux disease, unspecified whether esophagitis present  Acute cystitis with hematuria    Rx / DC Orders ED Discharge  Orders         Ordered    potassium chloride SA (KLOR-CON) 20 MEQ tablet  2 times daily        05/10/20 1900    famotidine (PEPCID) 20 MG tablet  2 times daily        05/10/20 1900    cephALEXin (KEFLEX) 500 MG capsule  2 times daily        05/10/20 1927           Charlesetta Shanks, MD 05/10/20 1940

## 2020-05-10 NOTE — ED Notes (Signed)
Pt currently in radiology.

## 2020-05-10 NOTE — Discharge Instructions (Signed)
1.  Schedule recheck with your doctor soon as possible. 2.  Start taking Pepcid twice daily as prescribed.  This is to help with symptoms of reflux.  Follow dietary instructions and lifestyle instructions for reflux disease. 3.  Your urine shows signs of infection.  Start Keflex as prescribed.  Return if you develop severe back pain, vomiting, fever or other concerning symptoms. 4.  You are experiencing symptoms of shortness of breath.  At this time, your CAT scan does not show any evidence of blood clot in the lungs.  Your EKG and heart labs do not indicate heart attack.  There was a possible finding of a condition called "pulmonary hypertension".  Discussed this with your doctor for continued monitoring and further evaluation. 5.  Return to the emergency department if you get any concerning or worsening symptoms.

## 2020-05-10 NOTE — ED Notes (Signed)
ED Provider at bedside. 

## 2020-05-10 NOTE — ED Notes (Signed)
Presents with SOB, dyspnea on exertion. Upon arrival to room placed on cont cardiac monitoring with cont POX monitoring and int NBP assessment.

## 2020-05-13 LAB — URINE CULTURE: Culture: >=100000 — AB

## 2020-05-14 ENCOUNTER — Emergency Department (HOSPITAL_COMMUNITY)
Admission: EM | Admit: 2020-05-14 | Discharge: 2020-05-15 | Disposition: A | Discharge status: Home / Self Care | Payer: PPO | Attending: Emergency Medicine | Admitting: Emergency Medicine

## 2020-05-14 ENCOUNTER — Emergency Department (HOSPITAL_COMMUNITY): Payer: PPO

## 2020-05-14 ENCOUNTER — Telehealth: Payer: Self-pay | Admitting: *Deleted

## 2020-05-14 DIAGNOSIS — Z5321 Procedure and treatment not carried out due to patient leaving prior to being seen by health care provider: Secondary | ICD-10-CM | POA: Insufficient documentation

## 2020-05-14 DIAGNOSIS — R079 Chest pain, unspecified: Secondary | ICD-10-CM | POA: Diagnosis not present

## 2020-05-14 DIAGNOSIS — R0789 Other chest pain: Secondary | ICD-10-CM | POA: Insufficient documentation

## 2020-05-14 NOTE — Telephone Encounter (Signed)
Post ED Visit - Positive Culture Follow-up  Culture report reviewed by antimicrobial stewardship pharmacist: [facility] Team []  Elenor Quinones, Pharm.D. []  Heide Guile, Pharm.D., BCPS AQ-ID []  Parks Neptune, Pharm.D., BCPS []  Alycia Rossetti, Pharm.D., BCPS []  [place], Pharm.D., BCPS, AAHIVP []  Legrand Como, Pharm.D., BCPS, AAHIVP [x]  Salome Arnt, PharmD, BCPS []  Johnnette Gourd, PharmD, BCPS []  Hughes Better, PharmD, BCPS []  Leeroy Cha, PharmD []  Laqueta Linden, PharmD, BCPS []  Albertina Parr, PharmD  [place] [facility] Team []  Leodis Sias, PharmD []  Lindell Spar, PharmD []  Royetta Asal, PharmD []  Graylin Shiver, Rph []  Rema Fendt) Glennon Mac, PharmD []  Arlyn Dunning, PharmD []  Netta Cedars, PharmD []  Dia Sitter, PharmD []  Leone Haven, PharmD []  Gretta Arab, PharmD []  Theodis Shove, PharmD []  Peggyann Juba, PharmD []  Reuel Boom, PharmD   Positive urine culture Treated with Cephalexin, organism sensitive to the same and no further patient follow-up is required at this time.  Harlon Flor [facility] 05/14/2020, 10:42 AM

## 2020-05-15 ENCOUNTER — Other Ambulatory Visit: Payer: Self-pay

## 2020-05-15 ENCOUNTER — Encounter (HOSPITAL_COMMUNITY): Payer: Self-pay | Admitting: Emergency Medicine

## 2020-05-15 LAB — BASIC METABOLIC PANEL
Anion gap: 11 (ref 5–15)
BUN: 6 mg/dL — ABNORMAL LOW (ref 8–23)
CO2: 22 mmol/L (ref 22–32)
Calcium: 9.6 mg/dL (ref 8.9–10.3)
Chloride: 103 mmol/L (ref 98–111)
Creatinine, Ser: 0.96 mg/dL (ref 0.44–1.00)
GFR calc Af Amer: >60 mL/min (ref >60)
GFR calc non Af Amer: 58 mL/min — ABNORMAL LOW (ref >60)
Glucose, Bld: 104 mg/dL — ABNORMAL HIGH (ref 70–99)
Potassium: 4.2 mmol/L (ref 3.5–5.1)
Sodium: 136 mmol/L (ref 135–145)

## 2020-05-15 LAB — CBC
HCT: 38.8 % (ref 36.0–46.0)
Hemoglobin: 12.9 g/dL (ref 12.0–15.0)
MCH: 30.8 pg (ref 26.0–34.0)
MCHC: 33.2 g/dL (ref 30.0–36.0)
MCV: 92.6 fL (ref 80.0–100.0)
Platelets: 243 10*3/uL (ref 150–400)
RBC: 4.19 MIL/uL (ref 3.87–5.11)
RDW: 13.7 % (ref 11.5–15.5)
WBC: 11.7 10*3/uL — ABNORMAL HIGH (ref 4.0–10.5)
nRBC: 0 % (ref 0.0–0.2)

## 2020-05-15 LAB — TROPONIN I (HIGH SENSITIVITY): Troponin I (High Sensitivity): 27 ng/L — ABNORMAL HIGH (ref <18)

## 2020-05-15 NOTE — ED Triage Notes (Signed)
Patient complaining of mid chest tightness for a week. Patient states she is having pain all over due to multiple surgeries.

## 2020-05-18 DIAGNOSIS — G894 Chronic pain syndrome: Secondary | ICD-10-CM | POA: Diagnosis not present

## 2020-05-18 DIAGNOSIS — M5417 Radiculopathy, lumbosacral region: Secondary | ICD-10-CM | POA: Diagnosis not present

## 2020-05-28 DIAGNOSIS — F332 Major depressive disorder, recurrent severe without psychotic features: Secondary | ICD-10-CM | POA: Diagnosis not present

## 2020-06-05 ENCOUNTER — Ambulatory Visit: Payer: PPO | Admitting: Gastroenterology

## 2020-06-06 ENCOUNTER — Ambulatory Visit: Payer: PPO

## 2020-06-26 DIAGNOSIS — F332 Major depressive disorder, recurrent severe without psychotic features: Secondary | ICD-10-CM | POA: Diagnosis not present

## 2020-06-28 ENCOUNTER — Encounter: Payer: Self-pay | Admitting: Adult Health

## 2020-06-28 ENCOUNTER — Encounter: Payer: PPO | Admitting: Adult Health

## 2020-06-28 ENCOUNTER — Ambulatory Visit (INDEPENDENT_AMBULATORY_CARE_PROVIDER_SITE_OTHER): Payer: PPO | Admitting: Adult Health

## 2020-06-28 ENCOUNTER — Other Ambulatory Visit: Payer: Self-pay

## 2020-06-28 VITALS — BP 124/70 | HR 64 | Temp 97.4°F | Ht 62.0 in | Wt 144.0 lb

## 2020-06-28 DIAGNOSIS — R630 Anorexia: Secondary | ICD-10-CM | POA: Diagnosis not present

## 2020-06-28 DIAGNOSIS — F321 Major depressive disorder, single episode, moderate: Secondary | ICD-10-CM

## 2020-06-28 DIAGNOSIS — R197 Diarrhea, unspecified: Secondary | ICD-10-CM | POA: Diagnosis not present

## 2020-06-28 DIAGNOSIS — I270 Primary pulmonary hypertension: Secondary | ICD-10-CM | POA: Diagnosis not present

## 2020-06-28 DIAGNOSIS — R300 Dysuria: Secondary | ICD-10-CM | POA: Diagnosis not present

## 2020-06-28 DIAGNOSIS — Z8719 Personal history of other diseases of the digestive system: Secondary | ICD-10-CM

## 2020-06-28 DIAGNOSIS — R0789 Other chest pain: Secondary | ICD-10-CM | POA: Diagnosis not present

## 2020-06-28 LAB — CBC WITH DIFFERENTIAL/PLATELET
Absolute Monocytes: 853 cells/uL (ref 200–950)
Basophils Absolute: 40 cells/uL (ref 0–200)
Basophils Relative: 0.5 %
Eosinophils Absolute: 119 cells/uL (ref 15–500)
Eosinophils Relative: 1.5 %
HCT: 36.1 % (ref 35.0–45.0)
Hemoglobin: 11.9 g/dL (ref 11.7–15.5)
Lymphs Abs: 1793 cells/uL (ref 850–3900)
MCH: 29.8 pg (ref 27.0–33.0)
MCHC: 33 g/dL (ref 32.0–36.0)
MCV: 90.3 fL (ref 80.0–100.0)
MPV: 9.2 fL (ref 7.5–12.5)
Monocytes Relative: 10.8 %
Neutro Abs: 5096 cells/uL (ref 1500–7800)
Neutrophils Relative %: 64.5 %
Platelets: 328 10*3/uL (ref 140–400)
RBC: 4 10*6/uL (ref 3.80–5.10)
RDW: 13 % (ref 11.0–15.0)
Total Lymphocyte: 22.7 %
WBC: 7.9 10*3/uL (ref 3.8–10.8)

## 2020-06-28 MED ORDER — PANTOPRAZOLE SODIUM 40 MG PO TBEC: 40.0000 mg | DELAYED_RELEASE_TABLET | Freq: Two times a day (BID) | ORAL | 1 refills | Status: DC

## 2020-06-28 NOTE — Progress Notes (Signed)
Subjective:    Patient ID: Bianca [name], female    DOB: 06/02/1946, 74 y.o.   MRN: [id number]  HPI  74 year old female who  has a past medical history of Arthritis, Blood transfusion without reported diagnosis, CAD (coronary artery disease), Carpal tunnel syndrome on both sides, Chronic lower back pain, Depression, GERD (gastroesophageal reflux disease), Hyperlipidemia, Myocardial infarction ([place]) (1997, 2007, 2013), and Numbness of foot.  She is being seen today for many issues  She was seen in the emergency room roughly 6 weeks ago with complaints of weight loss, chest discomfort, and diarrhea.  He reports that she has had these issues since her hip replacement in July 2021.  Hip replacement went well and she recovered well but she reports that she continues to lose weight, has decreased appetite, episodes of diarrhea and GERD.  She is taking Protonix 40 mg daily and reports that this has helped but she continues to have GERD-like symptoms that are especially apparent at night  All the symptoms improved for a short period of time but over the last few weeks they have returned.   She is having loose bowels but states that she has not had any diarrhea for multiple weeks at this point.  Does not notice any blood in her stool   She was referred to gastroenterology prior to being seen in the ER by this writer for globulus sensation when she eats, but had to canceled her appointment and has not rescheduled yet today she reports that the sensation has improved but is still there from time to time.  She is tolerating p.o. intake but is mostly eating soups and soft foods.  She denies nausea does have very infrequent episodes of vomiting due to chronic cough.  Her work-up in the ER showed a mild hypokalemia and was supplemented with the same supplementation for 5 days as it was thought this was likely from poor p.o. intake and/or diarrhea.  Also found to have a urinary tract infection was treated  empirically with Keflex ( she feels as though her UTI never completely went away and continues to have burning with urination) .  She did have a CT of the chest done due to shortness of breath which showed pulmonary hypertension.  She does feel as though this shortness of breath has improved since being seen in the emergency room.  Generally, she is having midsternal chest pain for an unknown amount of time that is located along a prior surgical incision that she had in 2019 for CABG x3  He does feel as though she is depressed and has not been seen by her psychiatrist recently.  She was kept on fluoxetine as well as hydroxyzine.  Review of Systems See HPI   Past Medical History:  Diagnosis Date  . Arthritis   . Blood transfusion without reported diagnosis   . CAD (coronary artery disease)    a. 1997 MI/PCI RCA;  b. 2007 MI/PCI of 100% RCA with Taxus DES x 3 placed, EF 55%;  c. 2010 Cath: stable anatomy;  d. 05/2012 NSTEMI/Cath/PCI: LM nl, LAD 60-83m, D1 20ost, LCX 45m, RCA 40-47m ISR, 60/95d (Treated w/ 2.75x33 Xience Xpedition DES), PDA 36m (Treated w/ PTCA), EF 60%.  . Carpal tunnel syndrome on both sides   . Chronic lower back pain   . Depression   . GERD (gastroesophageal reflux disease)   . Hyperlipidemia   . Myocardial infarction [facility]) 1997, 2007, 2013  . Numbness of foot  Left foot - back surgery 2009    Social History   Socioeconomic History  . Marital status: Married    Spouse name: Not on file  . Number of children: Not on file  . Years of education: Not on file  . Highest education level: Not on file  Occupational History  . Not on file  Tobacco Use  . Smoking status: Former Smoker    Packs/day: 0.25    Years: 50.00    Pack years: 12.50    Types: Cigarettes    Quit date: 02/2020    Years since quitting: 0.3  . Smokeless tobacco: Never Used  . Tobacco comment: 5 cigarettes per day  Vaping Use  . Vaping Use: Never used  Substance and Sexual Activity  .  Alcohol use: No    Alcohol/week: 0.0 standard drinks  . Drug use: No  . Sexual activity: Not Currently    Birth control/protection: Post-menopausal  Other Topics Concern  . Not on file  Social History Narrative   Is a homemaker   Married for 29 years    Has a daughter and son    Pets: Two dogs and a Neurosurgeon   Likes to garden ( flower beds).       Social Determinants of Health   Financial Resource Strain:   . Difficulty of Paying Living Expenses: Not on file  Food Insecurity:   . Worried About Charity fundraiser in the Last Year: Not on file  . Ran Out of Food in the Last Year: Not on file  Transportation Needs:   . Lack of Transportation (Medical): Not on file  . Lack of Transportation (Non-Medical): Not on file  Physical Activity:   . Days of Exercise per Week: Not on file  . Minutes of Exercise per Session: Not on file  Stress:   . Feeling of Stress : Not on file  Social Connections:   . Frequency of Communication with Friends and Family: Not on file  . Frequency of Social Gatherings with Friends and Family: Not on file  . Attends Religious Services: Not on file  . Active Member of Clubs or Organizations: Not on file  . Attends Archivist Meetings: Not on file  . Marital Status: Not on file  Intimate Partner Violence:   . Fear of Current or Ex-Partner: Not on file  . Emotionally Abused: Not on file  . Physically Abused: Not on file  . Sexually Abused: Not on file    Past Surgical History:  Procedure Laterality Date  . ANKLE SURGERY  Years ago   Left; "had to take out a floater"  . CARPAL TUNNEL RELEASE Left 10/12016  . CARPAL TUNNEL RELEASE Right 02/21/2019   Emerge Lasandra Beech, MD  . [place]  . CORONARY ANGIOPLASTY WITH STENT PLACEMENT  1997; 2007, 2013   "2 + 2; + 1= total of 5  . CORONARY ARTERY BYPASS GRAFT N/A 05/26/2017   Procedure: CORONARY ARTERY BYPASS GRAFTING (CABG), ON PUMP, TIMES Three, using left internal mammary  artery and right greater saphenous vein harvested endoscopically;  Surgeon: [name], MD;  Location: [place] [facility];  Service: Open Heart Surgery;  Laterality: N/A;  LIMA to LAD, SVG to OM 1, SVG to PDA  . LEFT HEART CATH AND CORONARY ANGIOGRAPHY N/A 05/26/2017   Procedure: LEFT HEART CATH AND CORONARY ANGIOGRAPHY;  Surgeon: Belva Crome, MD;  Location: [place] [facility];  Service: Cardiovascular;  Laterality: N/A;  .  LEFT HEART CATHETERIZATION WITH CORONARY ANGIOGRAM N/A 05/18/2012   Procedure: LEFT HEART CATHETERIZATION WITH CORONARY ANGIOGRAM;  Surgeon: Wellington Hampshire, MD;  Location: [place] [facility];  Service: Cardiovascular;  Laterality: N/A;  . [place]; 2009  . TEE WITHOUT CARDIOVERSION N/A 05/26/2017   Procedure: TRANSESOPHAGEAL ECHOCARDIOGRAM (TEE);  Surgeon: [name], MD;  Location: [place];  Service: Open Heart Surgery;  Laterality: N/A;  . TOTAL HIP ARTHROPLASTY Right 05/24/2014   Procedure: RIGHT TOTAL HIP ARTHROPLASTY ANTERIOR APPROACH;  Surgeon: Gearlean Alf, MD;  Location: [facility];  Service: Orthopedics;  Laterality: Right;  . TOTAL HIP ARTHROPLASTY Left 02/29/2020   Procedure: TOTAL HIP ARTHROPLASTY ANTERIOR APPROACH;  Surgeon: Gaynelle Arabian, MD;  Location: [facility];  Service: Orthopedics;  Laterality: Left;  176min    Family History  Problem Relation Age of Onset  . Cancer Paternal Grandfather        Esophageal  . Coronary artery disease Mother   . Diabetes Mother   . Hypertension Mother   . Dementia Father   . Sudden death Brother        Suicide  . Colon cancer Neg Hx     Allergies  Allergen Reactions  . Penicillins Itching and Rash    Has patient had a PCN reaction causing immediate rash, facial/tongue/throat swelling, SOB or lightheadedness with hypotension: yes rash that took a while to go away across the abdomen Has patient had a PCN reaction causing severe rash involving mucus membranes or skin necrosis: no Has patient had a PCN  reaction that required hospitalization: no Has patient had a PCN reaction occurring within the last 10 years: No  Tolerated Cephalosporin Date: 03/01/20.  . Tramadol Itching and Other (See Comments)    hallucinations  . Aspirin Nausea And Vomiting and Other (See Comments)    Burning in stomach- tolerates enteric coated  . Macrobid [Nitrofurantoin Monohyd Macro] Nausea And Vomiting  . Sulfa Antibiotics Nausea Only    Current Outpatient Medications on File Prior to Visit  Medication Sig Dispense Refill  . acetaminophen (TYLENOL) 325 MG tablet Take 2 tablets (650 mg total) by mouth every 6 (six) hours as needed for fever, headache, mild pain or moderate pain.    Marland Kitchen albuterol (VENTOLIN HFA) 108 (90 Base) MCG/ACT inhaler Inhale 2 puffs into the lungs every 6 (six) hours as needed for wheezing or shortness of breath. 18 g 1  . atorvastatin (LIPITOR) 80 MG tablet TAKE 1 TABLET BY MOUTH DAILY AT 6 PM. 90 tablet 3  . budesonide-formoterol (SYMBICORT) 160-4.5 MCG/ACT inhaler Inhale 2 puffs into the lungs in the morning and at bedtime. 12 each 3  . clopidogrel (PLAVIX) 75 MG tablet Take 1 tablet (75 mg total) by mouth daily. 90 tablet 3  . diphenhydrAMINE (BENADRYL) 25 MG tablet Take 50 mg by mouth at bedtime as needed for allergies.     . famotidine (PEPCID) 20 MG tablet Take 1 tablet (20 mg total) by mouth 2 (two) times daily. 60 tablet 0  . FLUoxetine HCl 60 MG TABS Take 60 mg by mouth daily.     . fluticasone (FLONASE) 50 MCG/ACT nasal spray SPRAY 2 SPRAYS INTO EACH NOSTRIL EVERY DAY 48 mL 0  . hydrOXYzine (ATARAX/VISTARIL) 50 MG tablet TAKE 1 TABLET (50 MG) BY MOUTH EVERY NIGHT AS NEEDED    . methocarbamol (ROBAXIN) 500 MG tablet Take 1 tablet (500 mg total) by mouth every 6 (six) hours as needed for muscle spasms. [place]  tablet 0  . pentosan polysulfate (ELMIRON) 100 MG capsule Take 200 mg by mouth 2 (two) times daily as needed (side pains).     . potassium chloride SA (KLOR-CON) 20 MEQ tablet Take 1  tablet (20 mEq total) by mouth 2 (two) times daily. 14 tablet 0  . Probiotic Product (PROBIOTIC PO) Take 1 capsule by mouth at bedtime.     . nitroGLYCERIN (NITROSTAT) 0.4 MG SL tablet Place 1 tablet (0.4 mg total) under the tongue every 5 (five) minutes as needed for chest pain. 50 tablet 3   No current facility-administered medications on file prior to visit.    BP 124/70 (BP Location: Left Arm, Patient Position: Sitting, Cuff Size: Normal)   Pulse 64   Temp (!) 97.4 F (36.3 C) (Axillary)   Ht 5\' 2"  (1.575 m)   Wt 144 lb (65.3 kg)   LMP  (LMP Unknown)   SpO2 95%   BMI 26.34 kg/m       Objective:   Physical Exam Vitals and nursing note reviewed.  Constitutional:      Appearance: Normal appearance. She is well-developed.  HENT:     Right Ear: Tympanic membrane, ear canal and external ear normal. There is no impacted cerumen.     Left Ear: Tympanic membrane, ear canal and external ear normal. There is no impacted cerumen.     Nose: Nose normal. No congestion or rhinorrhea.     Mouth/Throat:     Mouth: Mucous membranes are moist.     Pharynx: Oropharynx is clear. No oropharyngeal exudate or posterior oropharyngeal erythema.  Eyes:     Extraocular Movements: Extraocular movements intact.     Conjunctiva/sclera: Conjunctivae normal.     Pupils: Pupils are equal, round, and reactive to light.  Cardiovascular:     Rate and Rhythm: Normal rate and regular rhythm.     Pulses: Normal pulses.     Heart sounds: Normal heart sounds.  Pulmonary:     Effort: Pulmonary effort is normal.     Breath sounds: Normal breath sounds.  Abdominal:     General: Abdomen is flat. Bowel sounds are normal. There is no distension.     Palpations: Abdomen is soft. There is no mass.     Tenderness: There is abdominal tenderness in the suprapubic area. There is no right CVA tenderness, left CVA tenderness, guarding or rebound.     Hernia: No hernia is present.  Musculoskeletal:        General:  Tenderness (midsternal along surgical site) present. Normal range of motion.     Right lower leg: No edema.     Left lower leg: No edema.  Skin:    General: Skin is warm and dry.     Capillary Refill: Capillary refill takes less than 2 seconds.  Neurological:     General: No focal deficit present.     Mental Status: She is alert and oriented to person, place, and time.  Psychiatric:        Mood and Affect: Mood normal.        Behavior: Behavior normal.        Thought Content: Thought content normal.        Judgment: Judgment normal.       Assessment & Plan:  1. Hx of gastroesophageal reflux (GERD) -We will increase Protonix to twice daily dosing.  She was encouraged to call and schedule an appointment with gastroenterology as I think there help would be well appreciated.  Will check for H. pylori. - H Pylori, IGM, IGG, IGA AB - CBC with Differential/Platelet - Basic Metabolic Panel - pantoprazole (PROTONIX) 40 MG tablet; Take 1 tablet (40 mg total) by mouth 2 (two) times daily.  Dispense: 90 tablet; Refill: 1  2. Atypical chest pain -Does not appear to be cardiac related, all skeletal pain  3. Dysuria  - Urinalysis; Future - Culture, Urine; Future - Urinalysis - Culture, Urine  4. Diarrhea, unspecified type -Seems to have resolved but with loose stools.  Advised to continue probiotics.  Increase Protonix to 40 mg twice daily dosing to see if this helps - H Pylori, IGM, IGG, IGA AB  5. Pulmonary hypertension, primary ([place]) -Continue to monitor at this point in time since shortness of breath has improved. - CBC with Differential/Platelet - Basic Metabolic Panel  6. Loss of appetite  - H Pylori, IGM, IGG, IGA AB  7. Depression, major, single episode, moderate ([place]) -Advised to follow-up with her psychiatrist as this may be contributing to her loss of appetite and weight loss.   [name], NP   Time spent on chart review, time with patient; discussion of loss of  appetite, acid reflux, depression, pulmonary hypertension, and dysuria,treatment, follow up plan, and documentation 40 minutes

## 2020-06-29 ENCOUNTER — Telehealth: Payer: Self-pay | Admitting: Adult Health

## 2020-06-29 DIAGNOSIS — G894 Chronic pain syndrome: Secondary | ICD-10-CM | POA: Diagnosis not present

## 2020-06-29 LAB — URINE CULTURE
MICRO NUMBER:: 11191556
SPECIMEN QUALITY:: ADEQUATE

## 2020-06-29 LAB — URINALYSIS
Bilirubin Urine: NEGATIVE
Glucose, UA: NEGATIVE
Hgb urine dipstick: NEGATIVE
Ketones, ur: NEGATIVE
Nitrite: NEGATIVE
Protein, ur: NEGATIVE
Specific Gravity, Urine: 1.009 (ref 1.001–1.03)
pH: 5.5 (ref 5.0–8.0)

## 2020-06-29 NOTE — Progress Notes (Signed)
Erroneous entry

## 2020-06-29 NOTE — Telephone Encounter (Signed)
Pt would like to know if a medication can be called in for her UTI. she is aware that he is awaiting lab results.  she said her UTI is getting worse

## 2020-06-29 NOTE — Telephone Encounter (Signed)
Patient advise of provider's comments and advised. She verbalized understanding and will get otc Azo

## 2020-06-29 NOTE — Telephone Encounter (Signed)
Unfortunately I do not have the urine culture back yet, I have to wait for that to come back to make sure  to know what antibiotic to prescribe.  In the meantime she can take Azo if she would like to help with the discomfort

## 2020-06-30 ENCOUNTER — Other Ambulatory Visit: Payer: Self-pay | Admitting: Adult Health

## 2020-06-30 DIAGNOSIS — Z8719 Personal history of other diseases of the digestive system: Secondary | ICD-10-CM

## 2020-07-07 DIAGNOSIS — J90 Pleural effusion, not elsewhere classified: Secondary | ICD-10-CM | POA: Diagnosis not present

## 2020-07-07 DIAGNOSIS — I25709 Atherosclerosis of coronary artery bypass graft(s), unspecified, with unspecified angina pectoris: Secondary | ICD-10-CM | POA: Diagnosis not present

## 2020-07-07 DIAGNOSIS — Z743 Need for continuous supervision: Secondary | ICD-10-CM | POA: Diagnosis not present

## 2020-07-07 DIAGNOSIS — G8929 Other chronic pain: Secondary | ICD-10-CM | POA: Diagnosis not present

## 2020-07-07 DIAGNOSIS — R0600 Dyspnea, unspecified: Secondary | ICD-10-CM | POA: Diagnosis not present

## 2020-07-07 DIAGNOSIS — I4891 Unspecified atrial fibrillation: Secondary | ICD-10-CM | POA: Diagnosis not present

## 2020-07-07 DIAGNOSIS — J986 Disorders of diaphragm: Secondary | ICD-10-CM | POA: Diagnosis not present

## 2020-07-07 DIAGNOSIS — G894 Chronic pain syndrome: Secondary | ICD-10-CM | POA: Diagnosis not present

## 2020-07-07 DIAGNOSIS — R0902 Hypoxemia: Secondary | ICD-10-CM | POA: Diagnosis not present

## 2020-07-07 DIAGNOSIS — G7281 Critical illness myopathy: Secondary | ICD-10-CM | POA: Diagnosis not present

## 2020-07-07 DIAGNOSIS — Z7189 Other specified counseling: Secondary | ICD-10-CM | POA: Diagnosis not present

## 2020-07-07 DIAGNOSIS — J84112 Idiopathic pulmonary fibrosis: Secondary | ICD-10-CM | POA: Diagnosis not present

## 2020-07-07 DIAGNOSIS — J9601 Acute respiratory failure with hypoxia: Secondary | ICD-10-CM | POA: Diagnosis not present

## 2020-07-07 DIAGNOSIS — I272 Pulmonary hypertension, unspecified: Secondary | ICD-10-CM | POA: Diagnosis not present

## 2020-07-07 DIAGNOSIS — R279 Unspecified lack of coordination: Secondary | ICD-10-CM | POA: Diagnosis not present

## 2020-07-07 DIAGNOSIS — R918 Other nonspecific abnormal finding of lung field: Secondary | ICD-10-CM | POA: Diagnosis not present

## 2020-07-07 DIAGNOSIS — R059 Cough, unspecified: Secondary | ICD-10-CM | POA: Diagnosis not present

## 2020-07-07 DIAGNOSIS — K5900 Constipation, unspecified: Secondary | ICD-10-CM | POA: Diagnosis not present

## 2020-07-07 DIAGNOSIS — J984 Other disorders of lung: Secondary | ICD-10-CM | POA: Diagnosis not present

## 2020-07-07 DIAGNOSIS — R0603 Acute respiratory distress: Secondary | ICD-10-CM | POA: Diagnosis not present

## 2020-07-07 DIAGNOSIS — R1312 Dysphagia, oropharyngeal phase: Secondary | ICD-10-CM | POA: Diagnosis not present

## 2020-07-07 DIAGNOSIS — Z20822 Contact with and (suspected) exposure to covid-19: Secondary | ICD-10-CM | POA: Diagnosis not present

## 2020-07-07 DIAGNOSIS — J449 Chronic obstructive pulmonary disease, unspecified: Secondary | ICD-10-CM | POA: Diagnosis not present

## 2020-07-07 DIAGNOSIS — Z951 Presence of aortocoronary bypass graft: Secondary | ICD-10-CM | POA: Diagnosis not present

## 2020-07-07 DIAGNOSIS — F419 Anxiety disorder, unspecified: Secondary | ICD-10-CM | POA: Diagnosis not present

## 2020-07-07 DIAGNOSIS — D638 Anemia in other chronic diseases classified elsewhere: Secondary | ICD-10-CM | POA: Diagnosis not present

## 2020-07-07 DIAGNOSIS — I340 Nonrheumatic mitral (valve) insufficiency: Secondary | ICD-10-CM | POA: Diagnosis not present

## 2020-07-07 DIAGNOSIS — Z66 Do not resuscitate: Secondary | ICD-10-CM | POA: Diagnosis not present

## 2020-07-07 DIAGNOSIS — Z87891 Personal history of nicotine dependence: Secondary | ICD-10-CM | POA: Diagnosis not present

## 2020-07-07 DIAGNOSIS — M549 Dorsalgia, unspecified: Secondary | ICD-10-CM | POA: Diagnosis not present

## 2020-07-07 DIAGNOSIS — R778 Other specified abnormalities of plasma proteins: Secondary | ICD-10-CM | POA: Diagnosis not present

## 2020-07-07 DIAGNOSIS — R079 Chest pain, unspecified: Secondary | ICD-10-CM | POA: Diagnosis not present

## 2020-07-07 DIAGNOSIS — K219 Gastro-esophageal reflux disease without esophagitis: Secondary | ICD-10-CM | POA: Diagnosis not present

## 2020-07-07 DIAGNOSIS — I25119 Atherosclerotic heart disease of native coronary artery with unspecified angina pectoris: Secondary | ICD-10-CM | POA: Diagnosis not present

## 2020-07-07 DIAGNOSIS — J810 Acute pulmonary edema: Secondary | ICD-10-CM | POA: Diagnosis not present

## 2020-07-07 DIAGNOSIS — R7989 Other specified abnormal findings of blood chemistry: Secondary | ICD-10-CM | POA: Diagnosis not present

## 2020-07-07 DIAGNOSIS — D649 Anemia, unspecified: Secondary | ICD-10-CM | POA: Diagnosis not present

## 2020-07-07 DIAGNOSIS — Z96643 Presence of artificial hip joint, bilateral: Secondary | ICD-10-CM | POA: Diagnosis not present

## 2020-07-07 DIAGNOSIS — Z880 Allergy status to penicillin: Secondary | ICD-10-CM | POA: Diagnosis not present

## 2020-07-07 DIAGNOSIS — R0682 Tachypnea, not elsewhere classified: Secondary | ICD-10-CM | POA: Diagnosis not present

## 2020-07-07 DIAGNOSIS — J849 Interstitial pulmonary disease, unspecified: Secondary | ICD-10-CM | POA: Diagnosis not present

## 2020-07-07 DIAGNOSIS — J9621 Acute and chronic respiratory failure with hypoxia: Secondary | ICD-10-CM | POA: Diagnosis not present

## 2020-07-07 DIAGNOSIS — R5381 Other malaise: Secondary | ICD-10-CM | POA: Diagnosis not present

## 2020-07-07 DIAGNOSIS — F32A Depression, unspecified: Secondary | ICD-10-CM | POA: Diagnosis not present

## 2020-07-07 DIAGNOSIS — J440 Chronic obstructive pulmonary disease with acute lower respiratory infection: Secondary | ICD-10-CM | POA: Diagnosis not present

## 2020-07-07 DIAGNOSIS — R0602 Shortness of breath: Secondary | ICD-10-CM | POA: Diagnosis not present

## 2020-07-07 DIAGNOSIS — I361 Nonrheumatic tricuspid (valve) insufficiency: Secondary | ICD-10-CM | POA: Diagnosis not present

## 2020-07-07 DIAGNOSIS — J156 Pneumonia due to other aerobic Gram-negative bacteria: Secondary | ICD-10-CM | POA: Diagnosis not present

## 2020-07-07 DIAGNOSIS — Z515 Encounter for palliative care: Secondary | ICD-10-CM | POA: Diagnosis not present

## 2020-07-07 DIAGNOSIS — I480 Paroxysmal atrial fibrillation: Secondary | ICD-10-CM | POA: Diagnosis not present

## 2020-07-07 DIAGNOSIS — I517 Cardiomegaly: Secondary | ICD-10-CM | POA: Diagnosis not present

## 2020-07-07 DIAGNOSIS — J189 Pneumonia, unspecified organism: Secondary | ICD-10-CM | POA: Diagnosis not present

## 2020-07-07 DIAGNOSIS — I081 Rheumatic disorders of both mitral and tricuspid valves: Secondary | ICD-10-CM | POA: Diagnosis not present

## 2020-07-08 DIAGNOSIS — J9601 Acute respiratory failure with hypoxia: Secondary | ICD-10-CM | POA: Diagnosis not present

## 2020-07-08 DIAGNOSIS — D638 Anemia in other chronic diseases classified elsewhere: Secondary | ICD-10-CM | POA: Diagnosis not present

## 2020-07-08 DIAGNOSIS — I25709 Atherosclerosis of coronary artery bypass graft(s), unspecified, with unspecified angina pectoris: Secondary | ICD-10-CM | POA: Diagnosis not present

## 2020-07-09 DIAGNOSIS — J9601 Acute respiratory failure with hypoxia: Secondary | ICD-10-CM | POA: Diagnosis not present

## 2020-07-09 DIAGNOSIS — D638 Anemia in other chronic diseases classified elsewhere: Secondary | ICD-10-CM | POA: Diagnosis not present

## 2020-07-09 DIAGNOSIS — I25709 Atherosclerosis of coronary artery bypass graft(s), unspecified, with unspecified angina pectoris: Secondary | ICD-10-CM | POA: Diagnosis not present

## 2020-07-10 DIAGNOSIS — J9601 Acute respiratory failure with hypoxia: Secondary | ICD-10-CM | POA: Diagnosis not present

## 2020-07-10 DIAGNOSIS — J810 Acute pulmonary edema: Secondary | ICD-10-CM | POA: Diagnosis not present

## 2020-07-10 DIAGNOSIS — Z87891 Personal history of nicotine dependence: Secondary | ICD-10-CM | POA: Diagnosis not present

## 2020-07-10 DIAGNOSIS — I25709 Atherosclerosis of coronary artery bypass graft(s), unspecified, with unspecified angina pectoris: Secondary | ICD-10-CM | POA: Diagnosis not present

## 2020-07-10 DIAGNOSIS — J189 Pneumonia, unspecified organism: Secondary | ICD-10-CM | POA: Diagnosis not present

## 2020-07-11 DIAGNOSIS — J189 Pneumonia, unspecified organism: Secondary | ICD-10-CM | POA: Diagnosis not present

## 2020-07-11 DIAGNOSIS — J9601 Acute respiratory failure with hypoxia: Secondary | ICD-10-CM | POA: Diagnosis not present

## 2020-07-11 DIAGNOSIS — J810 Acute pulmonary edema: Secondary | ICD-10-CM | POA: Diagnosis not present

## 2020-07-11 DIAGNOSIS — I081 Rheumatic disorders of both mitral and tricuspid valves: Secondary | ICD-10-CM | POA: Diagnosis not present

## 2020-07-11 DIAGNOSIS — I25709 Atherosclerosis of coronary artery bypass graft(s), unspecified, with unspecified angina pectoris: Secondary | ICD-10-CM | POA: Diagnosis not present

## 2020-07-11 DIAGNOSIS — Z87891 Personal history of nicotine dependence: Secondary | ICD-10-CM | POA: Diagnosis not present

## 2020-07-12 DIAGNOSIS — I25709 Atherosclerosis of coronary artery bypass graft(s), unspecified, with unspecified angina pectoris: Secondary | ICD-10-CM | POA: Diagnosis not present

## 2020-07-12 DIAGNOSIS — J810 Acute pulmonary edema: Secondary | ICD-10-CM | POA: Diagnosis not present

## 2020-07-12 DIAGNOSIS — Z87891 Personal history of nicotine dependence: Secondary | ICD-10-CM | POA: Diagnosis not present

## 2020-07-12 DIAGNOSIS — J189 Pneumonia, unspecified organism: Secondary | ICD-10-CM | POA: Diagnosis not present

## 2020-07-12 DIAGNOSIS — J9601 Acute respiratory failure with hypoxia: Secondary | ICD-10-CM | POA: Diagnosis not present

## 2020-07-13 DIAGNOSIS — J810 Acute pulmonary edema: Secondary | ICD-10-CM | POA: Diagnosis not present

## 2020-07-13 DIAGNOSIS — J9601 Acute respiratory failure with hypoxia: Secondary | ICD-10-CM | POA: Diagnosis not present

## 2020-07-13 DIAGNOSIS — Z87891 Personal history of nicotine dependence: Secondary | ICD-10-CM | POA: Diagnosis not present

## 2020-07-13 DIAGNOSIS — I25709 Atherosclerosis of coronary artery bypass graft(s), unspecified, with unspecified angina pectoris: Secondary | ICD-10-CM | POA: Diagnosis not present

## 2020-07-13 DIAGNOSIS — J189 Pneumonia, unspecified organism: Secondary | ICD-10-CM | POA: Diagnosis not present

## 2020-07-14 DIAGNOSIS — J189 Pneumonia, unspecified organism: Secondary | ICD-10-CM | POA: Diagnosis not present

## 2020-07-14 DIAGNOSIS — J9601 Acute respiratory failure with hypoxia: Secondary | ICD-10-CM | POA: Diagnosis not present

## 2020-07-14 DIAGNOSIS — I25709 Atherosclerosis of coronary artery bypass graft(s), unspecified, with unspecified angina pectoris: Secondary | ICD-10-CM | POA: Diagnosis not present

## 2020-07-14 DIAGNOSIS — J810 Acute pulmonary edema: Secondary | ICD-10-CM | POA: Diagnosis not present

## 2020-07-14 DIAGNOSIS — Z87891 Personal history of nicotine dependence: Secondary | ICD-10-CM | POA: Diagnosis not present

## 2020-07-15 DIAGNOSIS — J449 Chronic obstructive pulmonary disease, unspecified: Secondary | ICD-10-CM | POA: Diagnosis not present

## 2020-07-15 DIAGNOSIS — Z87891 Personal history of nicotine dependence: Secondary | ICD-10-CM | POA: Diagnosis not present

## 2020-07-15 DIAGNOSIS — J810 Acute pulmonary edema: Secondary | ICD-10-CM | POA: Diagnosis not present

## 2020-07-15 DIAGNOSIS — I25709 Atherosclerosis of coronary artery bypass graft(s), unspecified, with unspecified angina pectoris: Secondary | ICD-10-CM | POA: Diagnosis not present

## 2020-07-15 DIAGNOSIS — J9621 Acute and chronic respiratory failure with hypoxia: Secondary | ICD-10-CM | POA: Diagnosis not present

## 2020-07-15 DIAGNOSIS — J189 Pneumonia, unspecified organism: Secondary | ICD-10-CM | POA: Diagnosis not present

## 2020-07-15 DIAGNOSIS — J9601 Acute respiratory failure with hypoxia: Secondary | ICD-10-CM | POA: Diagnosis not present

## 2020-07-16 DIAGNOSIS — Z87891 Personal history of nicotine dependence: Secondary | ICD-10-CM | POA: Diagnosis not present

## 2020-07-16 DIAGNOSIS — I25709 Atherosclerosis of coronary artery bypass graft(s), unspecified, with unspecified angina pectoris: Secondary | ICD-10-CM | POA: Diagnosis not present

## 2020-07-16 DIAGNOSIS — J810 Acute pulmonary edema: Secondary | ICD-10-CM | POA: Diagnosis not present

## 2020-07-16 DIAGNOSIS — J9601 Acute respiratory failure with hypoxia: Secondary | ICD-10-CM | POA: Diagnosis not present

## 2020-07-17 DIAGNOSIS — I25709 Atherosclerosis of coronary artery bypass graft(s), unspecified, with unspecified angina pectoris: Secondary | ICD-10-CM | POA: Diagnosis not present

## 2020-07-17 DIAGNOSIS — J810 Acute pulmonary edema: Secondary | ICD-10-CM | POA: Diagnosis not present

## 2020-07-17 DIAGNOSIS — J449 Chronic obstructive pulmonary disease, unspecified: Secondary | ICD-10-CM | POA: Diagnosis not present

## 2020-07-17 DIAGNOSIS — I4891 Unspecified atrial fibrillation: Secondary | ICD-10-CM | POA: Diagnosis not present

## 2020-07-17 DIAGNOSIS — J9601 Acute respiratory failure with hypoxia: Secondary | ICD-10-CM | POA: Diagnosis not present

## 2020-07-17 DIAGNOSIS — G7281 Critical illness myopathy: Secondary | ICD-10-CM | POA: Diagnosis not present

## 2020-07-17 DIAGNOSIS — I081 Rheumatic disorders of both mitral and tricuspid valves: Secondary | ICD-10-CM | POA: Diagnosis not present

## 2020-07-17 DIAGNOSIS — I340 Nonrheumatic mitral (valve) insufficiency: Secondary | ICD-10-CM | POA: Diagnosis not present

## 2020-07-17 DIAGNOSIS — I272 Pulmonary hypertension, unspecified: Secondary | ICD-10-CM | POA: Diagnosis not present

## 2020-07-17 DIAGNOSIS — J9621 Acute and chronic respiratory failure with hypoxia: Secondary | ICD-10-CM | POA: Diagnosis not present

## 2020-07-18 DIAGNOSIS — I340 Nonrheumatic mitral (valve) insufficiency: Secondary | ICD-10-CM | POA: Diagnosis not present

## 2020-07-18 DIAGNOSIS — G7281 Critical illness myopathy: Secondary | ICD-10-CM | POA: Diagnosis not present

## 2020-07-18 DIAGNOSIS — J9621 Acute and chronic respiratory failure with hypoxia: Secondary | ICD-10-CM | POA: Diagnosis not present

## 2020-07-18 DIAGNOSIS — J9601 Acute respiratory failure with hypoxia: Secondary | ICD-10-CM | POA: Diagnosis not present

## 2020-07-18 DIAGNOSIS — I4891 Unspecified atrial fibrillation: Secondary | ICD-10-CM | POA: Diagnosis not present

## 2020-07-18 DIAGNOSIS — J810 Acute pulmonary edema: Secondary | ICD-10-CM | POA: Diagnosis not present

## 2020-07-18 DIAGNOSIS — I272 Pulmonary hypertension, unspecified: Secondary | ICD-10-CM | POA: Diagnosis not present

## 2020-07-18 DIAGNOSIS — J449 Chronic obstructive pulmonary disease, unspecified: Secondary | ICD-10-CM | POA: Diagnosis not present

## 2020-07-18 DIAGNOSIS — I25709 Atherosclerosis of coronary artery bypass graft(s), unspecified, with unspecified angina pectoris: Secondary | ICD-10-CM | POA: Diagnosis not present

## 2020-07-19 DIAGNOSIS — I340 Nonrheumatic mitral (valve) insufficiency: Secondary | ICD-10-CM | POA: Diagnosis not present

## 2020-07-19 DIAGNOSIS — J810 Acute pulmonary edema: Secondary | ICD-10-CM | POA: Diagnosis not present

## 2020-07-19 DIAGNOSIS — I272 Pulmonary hypertension, unspecified: Secondary | ICD-10-CM | POA: Diagnosis not present

## 2020-07-19 DIAGNOSIS — I4891 Unspecified atrial fibrillation: Secondary | ICD-10-CM | POA: Diagnosis not present

## 2020-07-19 DIAGNOSIS — G7281 Critical illness myopathy: Secondary | ICD-10-CM | POA: Diagnosis not present

## 2020-07-19 DIAGNOSIS — J9621 Acute and chronic respiratory failure with hypoxia: Secondary | ICD-10-CM | POA: Diagnosis not present

## 2020-07-19 DIAGNOSIS — I25709 Atherosclerosis of coronary artery bypass graft(s), unspecified, with unspecified angina pectoris: Secondary | ICD-10-CM | POA: Diagnosis not present

## 2020-07-19 DIAGNOSIS — J449 Chronic obstructive pulmonary disease, unspecified: Secondary | ICD-10-CM | POA: Diagnosis not present

## 2020-07-20 DIAGNOSIS — I340 Nonrheumatic mitral (valve) insufficiency: Secondary | ICD-10-CM | POA: Diagnosis not present

## 2020-07-20 DIAGNOSIS — G7281 Critical illness myopathy: Secondary | ICD-10-CM | POA: Diagnosis not present

## 2020-07-20 DIAGNOSIS — I4891 Unspecified atrial fibrillation: Secondary | ICD-10-CM | POA: Diagnosis not present

## 2020-07-20 DIAGNOSIS — J810 Acute pulmonary edema: Secondary | ICD-10-CM | POA: Diagnosis not present

## 2020-07-20 DIAGNOSIS — I272 Pulmonary hypertension, unspecified: Secondary | ICD-10-CM | POA: Diagnosis not present

## 2020-07-20 DIAGNOSIS — J449 Chronic obstructive pulmonary disease, unspecified: Secondary | ICD-10-CM | POA: Diagnosis not present

## 2020-07-20 DIAGNOSIS — J9621 Acute and chronic respiratory failure with hypoxia: Secondary | ICD-10-CM | POA: Diagnosis not present

## 2020-07-20 DIAGNOSIS — I25709 Atherosclerosis of coronary artery bypass graft(s), unspecified, with unspecified angina pectoris: Secondary | ICD-10-CM | POA: Diagnosis not present

## 2020-07-21 DIAGNOSIS — I25709 Atherosclerosis of coronary artery bypass graft(s), unspecified, with unspecified angina pectoris: Secondary | ICD-10-CM | POA: Diagnosis not present

## 2020-07-21 DIAGNOSIS — J449 Chronic obstructive pulmonary disease, unspecified: Secondary | ICD-10-CM | POA: Diagnosis not present

## 2020-07-21 DIAGNOSIS — J810 Acute pulmonary edema: Secondary | ICD-10-CM | POA: Diagnosis not present

## 2020-07-21 DIAGNOSIS — I340 Nonrheumatic mitral (valve) insufficiency: Secondary | ICD-10-CM | POA: Diagnosis not present

## 2020-07-21 DIAGNOSIS — G7281 Critical illness myopathy: Secondary | ICD-10-CM | POA: Diagnosis not present

## 2020-07-21 DIAGNOSIS — J9621 Acute and chronic respiratory failure with hypoxia: Secondary | ICD-10-CM | POA: Diagnosis not present

## 2020-07-21 DIAGNOSIS — I4891 Unspecified atrial fibrillation: Secondary | ICD-10-CM | POA: Diagnosis not present

## 2020-07-21 DIAGNOSIS — I272 Pulmonary hypertension, unspecified: Secondary | ICD-10-CM | POA: Diagnosis not present

## 2020-07-22 DIAGNOSIS — J9601 Acute respiratory failure with hypoxia: Secondary | ICD-10-CM | POA: Diagnosis not present

## 2020-07-23 DIAGNOSIS — I480 Paroxysmal atrial fibrillation: Secondary | ICD-10-CM | POA: Diagnosis not present

## 2020-07-23 DIAGNOSIS — G894 Chronic pain syndrome: Secondary | ICD-10-CM | POA: Diagnosis not present

## 2020-07-23 DIAGNOSIS — J9601 Acute respiratory failure with hypoxia: Secondary | ICD-10-CM | POA: Diagnosis not present

## 2020-07-23 DIAGNOSIS — I272 Pulmonary hypertension, unspecified: Secondary | ICD-10-CM | POA: Diagnosis not present

## 2020-07-23 DIAGNOSIS — I25709 Atherosclerosis of coronary artery bypass graft(s), unspecified, with unspecified angina pectoris: Secondary | ICD-10-CM | POA: Diagnosis not present

## 2020-07-24 DIAGNOSIS — J84112 Idiopathic pulmonary fibrosis: Secondary | ICD-10-CM | POA: Diagnosis not present

## 2020-07-24 DIAGNOSIS — J9601 Acute respiratory failure with hypoxia: Secondary | ICD-10-CM | POA: Diagnosis not present

## 2020-07-24 DIAGNOSIS — J810 Acute pulmonary edema: Secondary | ICD-10-CM | POA: Diagnosis not present

## 2020-07-24 DIAGNOSIS — J849 Interstitial pulmonary disease, unspecified: Secondary | ICD-10-CM | POA: Diagnosis not present

## 2020-07-24 DIAGNOSIS — J156 Pneumonia due to other aerobic Gram-negative bacteria: Secondary | ICD-10-CM | POA: Diagnosis not present

## 2020-07-24 DIAGNOSIS — G894 Chronic pain syndrome: Secondary | ICD-10-CM | POA: Diagnosis not present

## 2020-07-24 DIAGNOSIS — I272 Pulmonary hypertension, unspecified: Secondary | ICD-10-CM | POA: Diagnosis not present

## 2020-07-24 DIAGNOSIS — J9621 Acute and chronic respiratory failure with hypoxia: Secondary | ICD-10-CM | POA: Diagnosis not present

## 2020-07-24 DIAGNOSIS — I480 Paroxysmal atrial fibrillation: Secondary | ICD-10-CM | POA: Diagnosis not present

## 2020-07-24 DIAGNOSIS — I25709 Atherosclerosis of coronary artery bypass graft(s), unspecified, with unspecified angina pectoris: Secondary | ICD-10-CM | POA: Diagnosis not present

## 2020-07-24 DIAGNOSIS — I4891 Unspecified atrial fibrillation: Secondary | ICD-10-CM | POA: Diagnosis not present

## 2020-07-24 DIAGNOSIS — I340 Nonrheumatic mitral (valve) insufficiency: Secondary | ICD-10-CM | POA: Diagnosis not present

## 2020-07-24 DIAGNOSIS — G7281 Critical illness myopathy: Secondary | ICD-10-CM | POA: Diagnosis not present

## 2020-07-25 ENCOUNTER — Ambulatory Visit: Payer: PPO | Admitting: Nurse Practitioner

## 2020-07-25 DIAGNOSIS — J156 Pneumonia due to other aerobic Gram-negative bacteria: Secondary | ICD-10-CM | POA: Diagnosis not present

## 2020-07-25 DIAGNOSIS — G7281 Critical illness myopathy: Secondary | ICD-10-CM | POA: Diagnosis not present

## 2020-07-25 DIAGNOSIS — I480 Paroxysmal atrial fibrillation: Secondary | ICD-10-CM | POA: Diagnosis not present

## 2020-07-25 DIAGNOSIS — J9601 Acute respiratory failure with hypoxia: Secondary | ICD-10-CM | POA: Diagnosis not present

## 2020-07-25 DIAGNOSIS — J9621 Acute and chronic respiratory failure with hypoxia: Secondary | ICD-10-CM | POA: Diagnosis not present

## 2020-07-25 DIAGNOSIS — J849 Interstitial pulmonary disease, unspecified: Secondary | ICD-10-CM | POA: Diagnosis not present

## 2020-07-25 DIAGNOSIS — R079 Chest pain, unspecified: Secondary | ICD-10-CM | POA: Diagnosis not present

## 2020-07-25 DIAGNOSIS — J810 Acute pulmonary edema: Secondary | ICD-10-CM | POA: Diagnosis not present

## 2020-07-25 DIAGNOSIS — I340 Nonrheumatic mitral (valve) insufficiency: Secondary | ICD-10-CM | POA: Diagnosis not present

## 2020-07-25 DIAGNOSIS — G894 Chronic pain syndrome: Secondary | ICD-10-CM | POA: Diagnosis not present

## 2020-07-25 DIAGNOSIS — I4891 Unspecified atrial fibrillation: Secondary | ICD-10-CM | POA: Diagnosis not present

## 2020-07-25 DIAGNOSIS — I272 Pulmonary hypertension, unspecified: Secondary | ICD-10-CM | POA: Diagnosis not present

## 2020-07-25 DIAGNOSIS — I25709 Atherosclerosis of coronary artery bypass graft(s), unspecified, with unspecified angina pectoris: Secondary | ICD-10-CM | POA: Diagnosis not present

## 2020-07-25 DIAGNOSIS — J84112 Idiopathic pulmonary fibrosis: Secondary | ICD-10-CM | POA: Diagnosis not present

## 2020-07-26 DIAGNOSIS — J9601 Acute respiratory failure with hypoxia: Secondary | ICD-10-CM | POA: Diagnosis not present

## 2020-07-26 DIAGNOSIS — I480 Paroxysmal atrial fibrillation: Secondary | ICD-10-CM | POA: Diagnosis not present

## 2020-07-26 DIAGNOSIS — G894 Chronic pain syndrome: Secondary | ICD-10-CM | POA: Diagnosis not present

## 2020-07-26 DIAGNOSIS — I25709 Atherosclerosis of coronary artery bypass graft(s), unspecified, with unspecified angina pectoris: Secondary | ICD-10-CM | POA: Diagnosis not present

## 2020-07-26 DIAGNOSIS — R079 Chest pain, unspecified: Secondary | ICD-10-CM | POA: Diagnosis not present

## 2020-07-26 DIAGNOSIS — I272 Pulmonary hypertension, unspecified: Secondary | ICD-10-CM | POA: Diagnosis not present

## 2020-07-26 DIAGNOSIS — J156 Pneumonia due to other aerobic Gram-negative bacteria: Secondary | ICD-10-CM | POA: Diagnosis not present

## 2020-07-26 DIAGNOSIS — J849 Interstitial pulmonary disease, unspecified: Secondary | ICD-10-CM | POA: Diagnosis not present

## 2020-07-27 DIAGNOSIS — R079 Chest pain, unspecified: Secondary | ICD-10-CM | POA: Diagnosis not present

## 2020-07-27 DIAGNOSIS — I272 Pulmonary hypertension, unspecified: Secondary | ICD-10-CM | POA: Diagnosis not present

## 2020-07-27 DIAGNOSIS — J9601 Acute respiratory failure with hypoxia: Secondary | ICD-10-CM | POA: Diagnosis not present

## 2020-07-27 DIAGNOSIS — G894 Chronic pain syndrome: Secondary | ICD-10-CM | POA: Diagnosis not present

## 2020-07-27 DIAGNOSIS — I25709 Atherosclerosis of coronary artery bypass graft(s), unspecified, with unspecified angina pectoris: Secondary | ICD-10-CM | POA: Diagnosis not present

## 2020-07-27 DIAGNOSIS — J156 Pneumonia due to other aerobic Gram-negative bacteria: Secondary | ICD-10-CM | POA: Diagnosis not present

## 2020-07-27 DIAGNOSIS — J849 Interstitial pulmonary disease, unspecified: Secondary | ICD-10-CM | POA: Diagnosis not present

## 2020-07-27 DIAGNOSIS — I480 Paroxysmal atrial fibrillation: Secondary | ICD-10-CM | POA: Diagnosis not present

## 2020-07-28 DIAGNOSIS — G894 Chronic pain syndrome: Secondary | ICD-10-CM | POA: Diagnosis not present

## 2020-07-28 DIAGNOSIS — I272 Pulmonary hypertension, unspecified: Secondary | ICD-10-CM | POA: Diagnosis not present

## 2020-07-28 DIAGNOSIS — J9601 Acute respiratory failure with hypoxia: Secondary | ICD-10-CM | POA: Diagnosis not present

## 2020-07-28 DIAGNOSIS — I480 Paroxysmal atrial fibrillation: Secondary | ICD-10-CM | POA: Diagnosis not present

## 2020-07-28 DIAGNOSIS — J849 Interstitial pulmonary disease, unspecified: Secondary | ICD-10-CM | POA: Diagnosis not present

## 2020-07-28 DIAGNOSIS — J156 Pneumonia due to other aerobic Gram-negative bacteria: Secondary | ICD-10-CM | POA: Diagnosis not present

## 2020-07-28 DIAGNOSIS — I25709 Atherosclerosis of coronary artery bypass graft(s), unspecified, with unspecified angina pectoris: Secondary | ICD-10-CM | POA: Diagnosis not present

## 2020-07-28 DIAGNOSIS — R079 Chest pain, unspecified: Secondary | ICD-10-CM | POA: Diagnosis not present

## 2020-07-29 DIAGNOSIS — I272 Pulmonary hypertension, unspecified: Secondary | ICD-10-CM | POA: Diagnosis not present

## 2020-07-29 DIAGNOSIS — I25709 Atherosclerosis of coronary artery bypass graft(s), unspecified, with unspecified angina pectoris: Secondary | ICD-10-CM | POA: Diagnosis not present

## 2020-07-29 DIAGNOSIS — J9601 Acute respiratory failure with hypoxia: Secondary | ICD-10-CM | POA: Diagnosis not present

## 2020-07-29 DIAGNOSIS — R079 Chest pain, unspecified: Secondary | ICD-10-CM | POA: Diagnosis not present

## 2020-07-29 DIAGNOSIS — J849 Interstitial pulmonary disease, unspecified: Secondary | ICD-10-CM | POA: Diagnosis not present

## 2020-07-29 DIAGNOSIS — G894 Chronic pain syndrome: Secondary | ICD-10-CM | POA: Diagnosis not present

## 2020-07-29 DIAGNOSIS — J156 Pneumonia due to other aerobic Gram-negative bacteria: Secondary | ICD-10-CM | POA: Diagnosis not present

## 2020-07-29 DIAGNOSIS — I480 Paroxysmal atrial fibrillation: Secondary | ICD-10-CM | POA: Diagnosis not present

## 2020-07-30 ENCOUNTER — Telehealth: Payer: Self-pay | Admitting: Adult Health

## 2020-07-30 DIAGNOSIS — I272 Pulmonary hypertension, unspecified: Secondary | ICD-10-CM | POA: Diagnosis not present

## 2020-07-30 DIAGNOSIS — J849 Interstitial pulmonary disease, unspecified: Secondary | ICD-10-CM | POA: Diagnosis not present

## 2020-07-30 DIAGNOSIS — I25709 Atherosclerosis of coronary artery bypass graft(s), unspecified, with unspecified angina pectoris: Secondary | ICD-10-CM | POA: Diagnosis not present

## 2020-07-30 DIAGNOSIS — I480 Paroxysmal atrial fibrillation: Secondary | ICD-10-CM | POA: Diagnosis not present

## 2020-07-30 DIAGNOSIS — R079 Chest pain, unspecified: Secondary | ICD-10-CM | POA: Diagnosis not present

## 2020-07-30 DIAGNOSIS — G894 Chronic pain syndrome: Secondary | ICD-10-CM | POA: Diagnosis not present

## 2020-07-30 DIAGNOSIS — J9601 Acute respiratory failure with hypoxia: Secondary | ICD-10-CM | POA: Diagnosis not present

## 2020-07-30 DIAGNOSIS — J156 Pneumonia due to other aerobic Gram-negative bacteria: Secondary | ICD-10-CM | POA: Diagnosis not present

## 2020-07-30 NOTE — Telephone Encounter (Signed)
Left message for patient to call back and schedule Medicare Annual Wellness Visit (AWV) either virtually or in office.   Last AWV no information please schedule at anytime with [facility] Nurse Health Advisor 1 or 2   This should be a 45 minute visit. 

## 2020-07-31 ENCOUNTER — Telehealth: Payer: Self-pay | Admitting: Adult Health

## 2020-07-31 NOTE — Telephone Encounter (Signed)
Pts spouse called in to speak with Robin and while on the phone he wanted to let us know that the pt was just brought home from the hospital with the Dx of Pulmonary Fibrosis and she is terminally ill and may not survive a call back she has hospice.

## 2020-07-31 NOTE — Telephone Encounter (Signed)
Patient daughter is calling and is requesting a call back regarding patients health, please advise. CB is [phone]

## 2020-08-01 ENCOUNTER — Telehealth: Payer: Self-pay | Admitting: Adult Health

## 2020-08-01 NOTE — Telephone Encounter (Signed)
Left voice mail on daughters phone to call back concerning her mother

## 2020-08-30 ENCOUNTER — Other Ambulatory Visit: Payer: Self-pay | Admitting: Adult Health

## 2020-08-30 MED ORDER — FUROSEMIDE 20 MG PO TABS: 20.0000 mg | ORAL_TABLET | Freq: Every day | ORAL | 3 refills | Status: DC

## 2020-09-13 ENCOUNTER — Other Ambulatory Visit: Payer: Self-pay | Admitting: Adult Health

## 2020-09-13 ENCOUNTER — Telehealth: Payer: Self-pay | Admitting: Adult Health

## 2020-09-13 MED ORDER — ALPRAZOLAM 0.25 MG PO TABS: 0.2500 mg | ORAL_TABLET | ORAL | 2 refills | Status: DC

## 2020-09-13 MED ORDER — CLONAZEPAM 0.5 MG PO TABS: 0.2500 mg | ORAL_TABLET | Freq: Two times a day (BID) | ORAL | 2 refills | Status: DC | PRN

## 2020-09-13 NOTE — Telephone Encounter (Signed)
Received a call from hospice nurse, Cristela Blue.  Patient in need of a refill of clonazepam 0.25 mg twice daily as well as Xanax 0.25 mg in the am

## 2020-10-12 ENCOUNTER — Other Ambulatory Visit (HOSPITAL_COMMUNITY): Payer: Self-pay | Admitting: Psychiatry

## 2020-10-12 DIAGNOSIS — F332 Major depressive disorder, recurrent severe without psychotic features: Secondary | ICD-10-CM

## 2020-10-21 ENCOUNTER — Other Ambulatory Visit: Payer: Self-pay | Admitting: Adult Health

## 2020-10-21 DIAGNOSIS — Z8719 Personal history of other diseases of the digestive system: Secondary | ICD-10-CM

## 2020-11-01 ENCOUNTER — Other Ambulatory Visit: Payer: Self-pay | Admitting: Adult Health

## 2020-11-01 MED ORDER — BENZONATATE 200 MG PO CAPS: 200.0000 mg | ORAL_CAPSULE | Freq: Three times a day (TID) | ORAL | 3 refills | Status: DC | PRN

## 2020-11-22 ENCOUNTER — Other Ambulatory Visit: Payer: Self-pay | Admitting: Adult Health

## 2020-11-22 MED ORDER — GABAPENTIN 300 MG PO CAPS: 300.0000 mg | ORAL_CAPSULE | Freq: Every day | ORAL | 1 refills | Status: DC

## 2020-12-07 ENCOUNTER — Telehealth: Payer: Self-pay | Admitting: Adult Health

## 2020-12-07 MED ORDER — ACETAMINOPHEN 500 MG PO TABS: 500.0000 mg | ORAL_TABLET | Freq: Four times a day (QID) | ORAL | 0 refills | Status: AC

## 2020-12-07 MED ORDER — CLONAZEPAM 0.5 MG PO TABS: 0.5000 mg | ORAL_TABLET | Freq: Three times a day (TID) | ORAL | 0 refills | Status: AC

## 2020-12-07 MED ORDER — ALPRAZOLAM 0.5 MG PO TABS: ORAL_TABLET | ORAL | 0 refills | Status: DC

## 2020-12-07 NOTE — Telephone Encounter (Signed)
Spoke with hospice RN, Cristela Blue about mutual patient.   Patient has been having to use morphine more frequently, but they would like to keep her from getting hyperalgesia associated with morphine.  Has been talking about dying and is crying more and appears to be more anxious about the last phase of her life.  They would like to add Tylenol and increase her antianxiety medication

## 2021-01-16 ENCOUNTER — Telehealth: Payer: Self-pay | Admitting: Adult Health

## 2021-01-16 NOTE — Telephone Encounter (Signed)
Bianca [name] is calling and stated that patient spouse tested positive for covid and patient is now experiencing a sore throat, cough and fatigue but has not taken a covid test. Nurse wanted to see if patient would qualify for antiviral medications, please advise. CB [phone]

## 2021-01-16 NOTE — Telephone Encounter (Signed)
Hey I tried to call the pt back. If they return call and you are the one answering can you please schedule her for a virtual?

## 2021-01-17 NOTE — Telephone Encounter (Signed)
Per Tommi Rumps pt can take Molnupravir 800mg  [id number]. Christina notified of update and stated she will write the Rx for pt. NPI was given for Rx. No further action needed

## 2021-01-17 NOTE — Telephone Encounter (Signed)
Spoke to patients daughter Theadora Rama to schedule virtual but per daughter patient is in hospice and is bed ridden and actively dying and can't do a virtual appointment. Daughter stated that she doesn't feel comfortable giving patient an at home COVID test and is requesting a call back, please advise. CB is [phone]

## 2021-01-17 NOTE — Telephone Encounter (Signed)
Spoke to Bianca [name] to give the ok verbal for antiviral Rx. Margreta Journey stated that she will need a name of the anitviral Rx and the strength. I advised that I will speak to [facility] verbally to get the information and return call.

## 2021-07-03 ENCOUNTER — Telehealth: Payer: Self-pay

## 2021-07-03 NOTE — Telephone Encounter (Signed)
Last OV 06/28/20. Person who answered the phone states that pt is in hospice care at this time.

## 2021-07-12 ENCOUNTER — Other Ambulatory Visit: Payer: Self-pay | Admitting: Adult Health

## 2021-08-17 ENCOUNTER — Other Ambulatory Visit: Payer: Self-pay | Admitting: Adult Health

## 2021-08-20 NOTE — Telephone Encounter (Signed)
Patient need to schedule an ov for more refills. 

## 2021-08-21 ENCOUNTER — Other Ambulatory Visit: Payer: Self-pay | Admitting: Adult Health

## 2021-10-10 ENCOUNTER — Other Ambulatory Visit: Payer: Self-pay

## 2021-10-10 ENCOUNTER — Telehealth: Payer: Self-pay | Admitting: Adult Health

## 2021-10-10 MED ORDER — CIPROFLOXACIN HCL 500 MG PO TABS: 500.0000 mg | ORAL_TABLET | Freq: Two times a day (BID) | ORAL | 0 refills | Status: AC

## 2021-10-10 NOTE — Telephone Encounter (Signed)
Cipro 500 mg BID for 3 days called into [facility] per [facility]. Sam Edison Pace Rn advised of update and will update pt.

## 2021-10-10 NOTE — Telephone Encounter (Signed)
Sam RN-Triage nurse  with [facility] is calling and has refaxed urinalysis and Sam said the pt is allergic to penicillin, macrobid and sulfa abx. Please review the results and call sam back

## 2021-11-12 ENCOUNTER — Other Ambulatory Visit: Payer: Self-pay | Admitting: Adult Health

## 2022-01-04 ENCOUNTER — Emergency Department (HOSPITAL_COMMUNITY): Payer: PPO

## 2022-01-04 ENCOUNTER — Encounter (HOSPITAL_COMMUNITY): Payer: Self-pay | Admitting: *Deleted

## 2022-01-04 ENCOUNTER — Emergency Department (HOSPITAL_COMMUNITY)
Admission: EM | Admit: 2022-01-04 | Discharge: 2022-01-04 | Disposition: A | Discharge status: Skilled Nursing Facility | Payer: PPO | Attending: Emergency Medicine | Admitting: Emergency Medicine

## 2022-01-04 ENCOUNTER — Other Ambulatory Visit: Payer: Self-pay

## 2022-01-04 DIAGNOSIS — R110 Nausea: Secondary | ICD-10-CM | POA: Diagnosis not present

## 2022-01-04 DIAGNOSIS — R0789 Other chest pain: Secondary | ICD-10-CM | POA: Insufficient documentation

## 2022-01-04 DIAGNOSIS — R079 Chest pain, unspecified: Secondary | ICD-10-CM | POA: Diagnosis not present

## 2022-01-04 DIAGNOSIS — Z20822 Contact with and (suspected) exposure to covid-19: Secondary | ICD-10-CM | POA: Diagnosis not present

## 2022-01-04 LAB — CBC WITH DIFFERENTIAL/PLATELET
Abs Immature Granulocytes: 0.02 10*3/uL (ref 0.00–0.07)
Basophils Absolute: 0.1 10*3/uL (ref 0.0–0.1)
Basophils Relative: 1 %
Eosinophils Absolute: 0.1 10*3/uL (ref 0.0–0.5)
Eosinophils Relative: 1 %
HCT: 33.7 % — ABNORMAL LOW (ref 36.0–46.0)
Hemoglobin: 11 g/dL — ABNORMAL LOW (ref 12.0–15.0)
Immature Granulocytes: 0 %
Lymphocytes Relative: 12 %
Lymphs Abs: 1.1 10*3/uL (ref 0.7–4.0)
MCH: 29 pg (ref 26.0–34.0)
MCHC: 32.6 g/dL (ref 30.0–36.0)
MCV: 88.9 fL (ref 80.0–100.0)
Monocytes Absolute: 0.7 10*3/uL (ref 0.1–1.0)
Monocytes Relative: 7 %
Neutro Abs: 7.6 10*3/uL (ref 1.7–7.7)
Neutrophils Relative %: 79 %
Platelets: 192 10*3/uL (ref 150–400)
RBC: 3.79 MIL/uL — ABNORMAL LOW (ref 3.87–5.11)
RDW: 12.5 % (ref 11.5–15.5)
WBC: 9.6 10*3/uL (ref 4.0–10.5)
nRBC: 0 % (ref 0.0–0.2)

## 2022-01-04 LAB — BASIC METABOLIC PANEL
Anion gap: 10 (ref 5–15)
BUN: 9 mg/dL (ref 8–23)
CO2: 25 mmol/L (ref 22–32)
Calcium: 9.3 mg/dL (ref 8.9–10.3)
Chloride: 101 mmol/L (ref 98–111)
Creatinine, Ser: 0.63 mg/dL (ref 0.44–1.00)
GFR, Estimated: >60 mL/min (ref >60)
Glucose, Bld: 112 mg/dL — ABNORMAL HIGH (ref 70–99)
Potassium: 3.9 mmol/L (ref 3.5–5.1)
Sodium: 136 mmol/L (ref 135–145)

## 2022-01-04 LAB — RESP PANEL BY RT-PCR (FLU A&B, COVID) ARPGX2
Influenza A by PCR: NEGATIVE
Influenza B by PCR: NEGATIVE
SARS Coronavirus 2 by RT PCR: NEGATIVE

## 2022-01-04 LAB — TROPONIN I (HIGH SENSITIVITY)
Troponin I (High Sensitivity): 16 ng/L (ref <18)
Troponin I (High Sensitivity): 19 ng/L — ABNORMAL HIGH (ref <18)

## 2022-01-04 MED ORDER — LIDOCAINE VISCOUS HCL 2 % MT SOLN
15.0000 mL | Freq: Once | OROMUCOSAL | Status: AC
  Administered 2022-01-04: 15 mL via ORAL
  Filled 2022-01-04: qty 15

## 2022-01-04 MED ORDER — ACETAMINOPHEN 325 MG PO TABS
650.0000 mg | ORAL_TABLET | Freq: Once | ORAL | Status: AC
  Administered 2022-01-04: 650 mg via ORAL
  Filled 2022-01-04: qty 2

## 2022-01-04 MED ORDER — ALUM & MAG HYDROXIDE-SIMETH 200-200-20 MG/5ML PO SUSP
30.0000 mL | Freq: Once | ORAL | Status: AC
  Administered 2022-01-04: 30 mL via ORAL
  Filled 2022-01-04: qty 30

## 2022-01-04 NOTE — ED Triage Notes (Signed)
Pt states acute onset cheat pain at 1000 this am.  Given 0.4 nitro that dropped her pressures from 140's to 120's but did not decrease the pain.  Given 4 mg zofran en-route for nausea.

## 2022-01-04 NOTE — ED Provider Notes (Signed)
[place] EMERGENCY DEPARTMENT Provider Note   CSN: [id number] Arrival date & time: 01/04/22  1130     History  No chief complaint on file.   Bianca [name] is a 76 y.o. female.  76 year old female with prior medical history as detailed below presents for evaluation.  Patient with complaint of anterior chest pain with radiation of this pain into her neck.  She is unable to provide significant details regarding her pain.  EMS transported her from home.  She has a valid DNR.  She apparently is on home hospice.  Per EMS, patient's discomfort began this morning.  Patient was given 1 sublingual nitroglycerin without improvement in her symptoms.  Aspirin was withheld secondary to listed allergy.  The history is provided by the patient, medical records and the EMS personnel.  Chest Pain Pain location:  Substernal area Pain quality: aching   Pain radiates to:  Neck Pain severity:  Mild Onset quality:  Unable to specify Timing:  Unable to specify Progression:  Unable to specify     Home Medications Prior to Admission medications   Medication Sig Start Date End Date Taking? Authorizing Provider  albuterol (VENTOLIN HFA) 108 (90 Base) MCG/ACT inhaler Inhale 2 puffs into the lungs every 6 (six) hours as needed for wheezing or shortness of breath. 01/23/20   Martinique, Betty G, MD  ALPRAZolam Duanne Moron) 0.5 MG tablet Use at bedtime nightly and can take an additional tab if needed in the middle of the night for insomnia 12/07/20   Nafziger, Tommi Rumps, NP  atorvastatin (LIPITOR) 80 MG tablet TAKE 1 TABLET BY MOUTH DAILY AT 6 PM. 02/24/20   Belva Crome, MD  benzonatate (TESSALON) 200 MG capsule Take 1 capsule (200 mg total) by mouth 3 (three) times daily as needed for cough. 11/01/20   Nafziger, Tommi Rumps, NP  budesonide-formoterol (SYMBICORT) 160-4.5 MCG/ACT inhaler Inhale 2 puffs into the lungs in the morning and at bedtime. 04/17/20 07/16/20  Nafziger, Tommi Rumps, NP  clonazePAM (KLONOPIN)  0.5 MG tablet Take 1 tablet (0.5 mg total) by mouth in the morning, at noon, and at bedtime. 12/07/20 03/07/21  Nafziger, Tommi Rumps, NP  clopidogrel (PLAVIX) 75 MG tablet Take 1 tablet (75 mg total) by mouth daily. 03/28/20   Belva Crome, MD  diphenhydrAMINE (BENADRYL) 25 MG tablet Take 50 mg by mouth at bedtime as needed for allergies.     [provider]  famotidine (PEPCID) 20 MG tablet Take 1 tablet (20 mg total) by mouth 2 (two) times daily. 05/10/20   Charlesetta Shanks, MD  FLUoxetine HCl 60 MG TABS Take 60 mg by mouth daily.  01/10/20   [provider]  fluticasone (FLONASE) 50 MCG/ACT nasal spray SPRAY 2 SPRAYS INTO EACH NOSTRIL EVERY DAY 05/04/20   Nafziger, Tommi Rumps, NP  furosemide (LASIX) 20 MG tablet Take 1 tablet (20 mg total) by mouth daily. 08/30/20   Nafziger, Tommi Rumps, NP  gabapentin (NEURONTIN) 300 MG capsule Take 1 capsule (300 mg total) by mouth at bedtime. 11/22/20   Nafziger, Tommi Rumps, NP  hydrOXYzine (ATARAX/VISTARIL) 50 MG tablet TAKE 1 TABLET (50 MG) BY MOUTH EVERY NIGHT AS NEEDED 11/21/18   [provider]  methocarbamol (ROBAXIN) 500 MG tablet Take 1 tablet (500 mg total) by mouth every 6 (six) hours as needed for muscle spasms. 03/01/20   Edmisten, Ok Anis, PA  nitroGLYCERIN (NITROSTAT) 0.4 MG SL tablet Place 1 tablet (0.4 mg total) under the tongue every 5 (five) minutes as needed for chest pain.  11/01/18 02/15/20  Belva Crome, MD  pantoprazole (PROTONIX) 40 MG tablet TAKE 1 TABLET BY MOUTH TWICE A DAY 10/23/20   Nafziger, Tommi Rumps, NP  pentosan polysulfate (ELMIRON) 100 MG capsule Take 200 mg by mouth 2 (two) times daily as needed (side pains).  04/09/17   [provider]  potassium chloride SA (KLOR-CON) 20 MEQ tablet Take 1 tablet (20 mEq total) by mouth 2 (two) times daily. 05/10/20   Charlesetta Shanks, MD  Probiotic Product (PROBIOTIC PO) Take 1 capsule by mouth at bedtime.     [provider]  SENEXON-S 8.6-50 MG tablet TAKE ONE TABLET TWICE DAILY FOR  CONSTIPATION 11/13/21   Nafziger, Tommi Rumps, NP      Allergies    Penicillins, Tramadol, Aspirin, Macrobid [nitrofurantoin monohyd macro], and Sulfa antibiotics    Review of Systems   Review of Systems  Cardiovascular:  Positive for chest pain.  All other systems reviewed and are negative.  Physical Exam Updated Vital Signs BP 128/78   Pulse 72   Temp 98.1 F (36.7 C) (Oral)   LMP  (LMP Unknown)   SpO2 100%  Physical Exam  ED Results / Procedures / Treatments   Labs (all labs ordered are listed, but only abnormal results are displayed) Labs Reviewed  CBC WITH DIFFERENTIAL/PLATELET  BASIC METABOLIC PANEL  TROPONIN I (HIGH SENSITIVITY)    EKG EKG Interpretation  Date/Time:  Saturday Jan 04 2022 11:43:04 EDT Ventricular Rate:  73 PR Interval:  160 QRS Duration: 84 QT Interval:  411 QTC Calculation: 453 R Axis:   57 Text Interpretation: Sinus rhythm Nonspecific T abnormalities, anterior leads Confirmed by Dene Gentry [phone]) on 01/04/2022 11:45:30 AM  Radiology No results found.  Procedures Procedures    Medications Ordered in ED Medications  alum & mag hydroxide-simeth (MAALOX/MYLANTA) 200-200-20 MG/5ML suspension 30 mL (30 mLs Oral Given 01/04/22 1152)    And  lidocaine (XYLOCAINE) 2 % viscous mouth solution 15 mL (15 mLs Oral Given 01/04/22 1152)    ED Course/ Medical Decision Making/ A&P                           Medical Decision Making Amount and/or Complexity of Data Reviewed Labs: ordered. Radiology: ordered.  Risk OTC drugs. Prescription drug management.    Medical Screen Complete  This patient presented to the ED with complaint of chest pain.  This complaint involves an extensive number of treatment options. The initial differential diagnosis includes, but is not limited to, MSK pain, ACS, metabolic abnormality etc.  This presentation is: Acute, Chronic, Self-Limited, Previously Undiagnosed, Uncertain Prognosis, Complicated, Systemic  Symptoms, and Threat to Life/Bodily Function  Patient presents with complaint of atypical chest discomfort.  Patient with advanced dementia, DNR/DNI, hospice care.  Patient's chest pain is poorly described.  Patient without apparent continuation of this discomfort during ED evaluation.  Screening labs obtained are without significant abnormality.  EKG is without evidence of acute ischemia.  Troponin x2 is minimally detectable.  Case discussed with the patient's husband.  He does not feel that the patient is appropriate for discharge back to the home.  He does not feel that the patient would benefit from overnight observation.  Hospice is aware of the case and has established a respite bed for the patient at [place].  Patient is to be discharged to [facility].  Patient's husband, who makes medical decisions for the patient, feels that placement at [facility] is the best  outcome for this patient at this time.   Additional history obtained:  Additional history obtained from EMS External records from outside sources obtained and reviewed including prior ED visits and prior Inpatient records.    Lab Tests:  I ordered and personally interpreted labs.  The pertinent results include: CBC, BMP, troponin, COVID, flu   Imaging Studies ordered:  I ordered imaging studies including CXR  I independently visualized and interpreted obtained imaging which showed NAD I agree with the radiologist interpretation.   Cardiac Monitoring:  The patient was maintained on a cardiac monitor.  I personally viewed and interpreted the cardiac monitor which showed an underlying rhythm of: NSR   Medicines ordered:  I ordered medication including GI cocktail, Tylenol for reported chest discomfort Reevaluation of the patient after these medicines showed that the patient: resolved  Problem List / ED Course:  Typical chest discomfort   Reevaluation:  After the interventions noted above, I  reevaluated the patient and found that they have: improved  Disposition:  After consideration of the diagnostic results and the patients response to treatment, I feel that the patent would benefit from close outpatient follow-up.  Patient is being discharged to hospice bed at [facility].         Final Clinical Impression(s) / ED Diagnoses Final diagnoses:  Nonspecific chest pain    Rx / DC Orders ED Discharge Orders     None         Valarie Merino, MD 01/04/22 1729

## 2022-01-04 NOTE — ED Notes (Signed)
Updated pt.  Pt repeatedly calling out stating she is ready to go.

## 2022-01-04 NOTE — Progress Notes (Signed)
Manufacturing engineer [name]) [name] Liaison Note  This is a current Bianca [name] hospice patient. We will follow while hospitalized to coordinate continuation of hospice at discharge. Please call with any questions or concerns. Thank you   Roselee Nova, [name] Liaison [phone]

## 2022-04-18 DEATH — deceased
